# Patient Record
Sex: Male | Born: 1950
Health system: Southern US, Community
[De-identification: ages and names within clinical notes are randomized; demographics above are authoritative.]

## PROBLEM LIST (undated history)

## (undated) ENCOUNTER — Emergency Department (HOSPITAL_COMMUNITY): Discharge status: Absent without leave | Payer: No Typology Code available for payment source

## (undated) DIAGNOSIS — F1020 Alcohol dependence, uncomplicated: Secondary | ICD-10-CM

## (undated) DIAGNOSIS — K429 Umbilical hernia without obstruction or gangrene: Secondary | ICD-10-CM

## (undated) DIAGNOSIS — I4510 Unspecified right bundle-branch block: Secondary | ICD-10-CM

## (undated) DIAGNOSIS — T7840XA Allergy, unspecified, initial encounter: Secondary | ICD-10-CM

## (undated) DIAGNOSIS — F329 Major depressive disorder, single episode, unspecified: Secondary | ICD-10-CM

## (undated) DIAGNOSIS — R519 Headache, unspecified: Secondary | ICD-10-CM

## (undated) DIAGNOSIS — E785 Hyperlipidemia, unspecified: Secondary | ICD-10-CM

## (undated) DIAGNOSIS — F32A Depression, unspecified: Secondary | ICD-10-CM

## (undated) DIAGNOSIS — I10 Essential (primary) hypertension: Secondary | ICD-10-CM

## (undated) DIAGNOSIS — J45909 Unspecified asthma, uncomplicated: Secondary | ICD-10-CM

## (undated) DIAGNOSIS — R251 Tremor, unspecified: Secondary | ICD-10-CM

## (undated) DIAGNOSIS — J441 Chronic obstructive pulmonary disease with (acute) exacerbation: Secondary | ICD-10-CM

## (undated) DIAGNOSIS — I5032 Chronic diastolic (congestive) heart failure: Secondary | ICD-10-CM

## (undated) DIAGNOSIS — F419 Anxiety disorder, unspecified: Secondary | ICD-10-CM

## (undated) DIAGNOSIS — R911 Solitary pulmonary nodule: Secondary | ICD-10-CM

## (undated) DIAGNOSIS — R569 Unspecified convulsions: Secondary | ICD-10-CM

## (undated) DIAGNOSIS — R51 Headache: Secondary | ICD-10-CM

## (undated) DIAGNOSIS — I509 Heart failure, unspecified: Secondary | ICD-10-CM

## (undated) DIAGNOSIS — J449 Chronic obstructive pulmonary disease, unspecified: Secondary | ICD-10-CM

## (undated) HISTORY — DX: Depression, unspecified: F32.A

## (undated) HISTORY — DX: Essential (primary) hypertension: I10

## (undated) HISTORY — DX: Anxiety disorder, unspecified: F41.9

## (undated) HISTORY — DX: Allergy, unspecified, initial encounter: T78.40XA

## (undated) HISTORY — DX: Alcohol dependence, uncomplicated: F10.20

## (undated) HISTORY — DX: Major depressive disorder, single episode, unspecified: F32.9

## (undated) HISTORY — DX: Unspecified convulsions: R56.9

## (undated) HISTORY — DX: Unspecified asthma, uncomplicated: J45.909

## (undated) HISTORY — DX: Hyperlipidemia, unspecified: E78.5

## (undated) HISTORY — DX: Heart failure, unspecified: I50.9

---
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
## 2010-10-06 DIAGNOSIS — I509 Heart failure, unspecified: Secondary | ICD-10-CM

## 2010-10-06 HISTORY — DX: Heart failure, unspecified: I50.9

## 2010-10-21 ENCOUNTER — Emergency Department (HOSPITAL_COMMUNITY): Payer: PRIVATE HEALTH INSURANCE

## 2010-10-21 ENCOUNTER — Inpatient Hospital Stay (HOSPITAL_COMMUNITY)
Admission: EM | Admit: 2010-10-21 | Discharge: 2010-10-25 | DRG: 897 | Disposition: A | Discharge status: Home / Self Care | Payer: PRIVATE HEALTH INSURANCE | Attending: Family Medicine | Admitting: Family Medicine

## 2010-10-21 ENCOUNTER — Encounter: Payer: Self-pay | Admitting: Family Medicine

## 2010-10-21 DIAGNOSIS — F1010 Alcohol abuse, uncomplicated: Secondary | ICD-10-CM

## 2010-10-21 DIAGNOSIS — F17200 Nicotine dependence, unspecified, uncomplicated: Secondary | ICD-10-CM | POA: Diagnosis present

## 2010-10-21 DIAGNOSIS — I5023 Acute on chronic systolic (congestive) heart failure: Secondary | ICD-10-CM

## 2010-10-21 DIAGNOSIS — R569 Unspecified convulsions: Secondary | ICD-10-CM | POA: Diagnosis present

## 2010-10-21 DIAGNOSIS — T424X1A Poisoning by benzodiazepines, accidental (unintentional), initial encounter: Secondary | ICD-10-CM | POA: Diagnosis present

## 2010-10-21 DIAGNOSIS — E871 Hypo-osmolality and hyponatremia: Secondary | ICD-10-CM | POA: Diagnosis present

## 2010-10-21 DIAGNOSIS — F329 Major depressive disorder, single episode, unspecified: Secondary | ICD-10-CM | POA: Diagnosis present

## 2010-10-21 DIAGNOSIS — R188 Other ascites: Secondary | ICD-10-CM | POA: Diagnosis present

## 2010-10-21 DIAGNOSIS — F10939 Alcohol use, unspecified with withdrawal, unspecified: Principal | ICD-10-CM | POA: Diagnosis present

## 2010-10-21 DIAGNOSIS — I10 Essential (primary) hypertension: Secondary | ICD-10-CM

## 2010-10-21 DIAGNOSIS — E876 Hypokalemia: Secondary | ICD-10-CM | POA: Diagnosis present

## 2010-10-21 DIAGNOSIS — I509 Heart failure, unspecified: Secondary | ICD-10-CM | POA: Diagnosis present

## 2010-10-21 DIAGNOSIS — F3289 Other specified depressive episodes: Secondary | ICD-10-CM | POA: Diagnosis present

## 2010-10-21 DIAGNOSIS — F1020 Alcohol dependence, uncomplicated: Secondary | ICD-10-CM | POA: Diagnosis present

## 2010-10-21 DIAGNOSIS — F10239 Alcohol dependence with withdrawal, unspecified: Principal | ICD-10-CM | POA: Diagnosis present

## 2010-10-21 DIAGNOSIS — I5040 Unspecified combined systolic (congestive) and diastolic (congestive) heart failure: Secondary | ICD-10-CM | POA: Diagnosis present

## 2010-10-21 DIAGNOSIS — F411 Generalized anxiety disorder: Secondary | ICD-10-CM | POA: Diagnosis present

## 2010-10-21 DIAGNOSIS — T424X4A Poisoning by benzodiazepines, undetermined, initial encounter: Secondary | ICD-10-CM | POA: Diagnosis present

## 2010-10-21 DIAGNOSIS — J3489 Other specified disorders of nose and nasal sinuses: Secondary | ICD-10-CM | POA: Diagnosis present

## 2010-10-21 DIAGNOSIS — R000 Tachycardia, unspecified: Secondary | ICD-10-CM | POA: Diagnosis present

## 2010-10-21 DIAGNOSIS — I426 Alcoholic cardiomyopathy: Secondary | ICD-10-CM | POA: Diagnosis present

## 2010-10-21 LAB — PROTIME-INR
INR: 1.3 (ref 0.00–1.49)
Prothrombin Time: 16.4 seconds — ABNORMAL HIGH (ref 11.6–15.2)

## 2010-10-21 LAB — DIFFERENTIAL
Basophils Absolute: 0 10*3/uL (ref 0.0–0.1)
Basophils Relative: 0 % (ref 0–1)
Eosinophils Absolute: 0.1 10*3/uL (ref 0.0–0.7)
Monocytes Absolute: 1 10*3/uL (ref 0.1–1.0)
Monocytes Relative: 10 % (ref 3–12)
Neutro Abs: 8.4 10*3/uL — ABNORMAL HIGH (ref 1.7–7.7)
Neutrophils Relative %: 86 % — ABNORMAL HIGH (ref 43–77)

## 2010-10-21 LAB — LIPASE, BLOOD: Lipase: 27 U/L (ref 11–59)

## 2010-10-21 LAB — URINALYSIS, ROUTINE W REFLEX MICROSCOPIC
Glucose, UA: NEGATIVE mg/dL
Ketones, ur: 15 mg/dL — AB
Leukocytes, UA: NEGATIVE
Nitrite: NEGATIVE
Protein, ur: 100 mg/dL — AB
Urobilinogen, UA: 4 mg/dL — ABNORMAL HIGH (ref 0.0–1.0)

## 2010-10-21 LAB — RAPID URINE DRUG SCREEN, HOSP PERFORMED
Amphetamines: NOT DETECTED
Barbiturates: NOT DETECTED
Opiates: NOT DETECTED
Tetrahydrocannabinol: NOT DETECTED

## 2010-10-21 LAB — AMMONIA: Ammonia: 38 umol/L (ref 11–60)

## 2010-10-21 LAB — ETHANOL: Alcohol, Ethyl (B): <11 mg/dL (ref 0–11)

## 2010-10-21 NOTE — H&P (Signed)
Juan [name] is an 60 y.o. male.    Chief Complaint: New Onset seizure, AMS  HPI: Pt is a 60 yo male with a PMH of depression, anxiety and alcohol abuse who presented to the ED after a witnessed seizure at home. Pt has no memory of what happened today. Wife states pt has not been feeling well for the last week or so. He missed his appointment with Mental Health last Wednesday because he "felt something bad would happen". Wife called Psych NP, Valinda Hoar, on day of appointment who advised her to stop his Klonopin because NP was concerned about his alcohol use.  Pt's last dose of Klonopin was 5 days ago. He was taking 0.5mg  BID for an extended period of time prior to this abrupt stop. Today at 4:30 pm, wife states pt began stuttering and was not responding to questions. His arms became stiff, face was red and fell to ground. She called 9-1-1. Episode lasted a total of 15 minutes. After arrival to ED, pt had another witnessed seizure in which his arms were drawn up and gaze was to the right. He was given Ativan and O2. Pt had Head CT which was wnl. His labs showed he was hyponatremic, his LFTs were elevated, ammonia was wnl and clotting enzymes were wnl. Pt was started on banana bag and kept on O2.   PMH: Anxiety and depression treated by [facility] Does not have a PCP (has only been to the doctor once in many years) PSH: Ingrown toenail Fam Hx: mom had alzheimer's. DM and HTN also run in the family.  Social History: Lives with wife. Smokes 3/4-1.5ppd for 40 years. Drinks 1/2 gallon of Bourbon per week. No ilicit drug use. Works as a Interior and spatial designer at [employer]  Allergies: NKDA  Medications: Zoloft 100mg  2 tabs daily, Klonopin 0.5mg  po BID scheduled  Results for orders placed during the hospital encounter of 10/21/10 (from the past 48 hour(s))  DIFFERENTIAL     Status: Abnormal   Collection Time   10/21/10  5:59 PM      Component Value Range Comment   Neutrophils Relative 86 (*) 43 - 77 (%)    Neutro Abs 8.4 (*) 1.7 - 7.7 (K/uL)    Lymphocytes Relative 4 (*) 12 - 46 (%)    Lymphs Abs 0.3 (*) 0.7 - 4.0 (K/uL)    Monocytes Relative 10  3 - 12 (%)    Monocytes Absolute 1.0  0.1 - 1.0 (K/uL)    Eosinophils Relative 1  0 - 5 (%)    Eosinophils Absolute 0.1  0.0 - 0.7 (K/uL)    Basophils Relative 0  0 - 1 (%)    Basophils Absolute 0.0  0.0 - 0.1 (K/uL)   CBC     Status: Normal   Collection Time   10/21/10  5:59 PM      Component Value Range Comment   WBC 9.8  4.0 - 10.5 (K/uL)    RBC 4.85  3.87 - 5.11 (MIL/uL)    Hemoglobin 14.4  12.0 - 15.0 (g/dL)    HCT 03.4  74.2 - 59.5 (%)    MCV 83.9  78.0 - 100.0 (fL)    MCH 29.7  26.0 - 34.0 (pg)    MCHC 35.4  30.0 - 36.0 (g/dL)    RDW 63.8  75.6 - 43.3 (%)    Platelets 173  150 - 400 (K/uL)   PROTIME-INR     Status: Abnormal   Collection Time  10/21/10  5:59 PM      Component Value Range Comment   Prothrombin Time 16.4 (*) 11.6 - 15.2 (seconds)    INR 1.30  0.00 - 1.49    APTT     Status: Normal   Collection Time   10/21/10  5:59 PM      Component Value Range Comment   aPTT 30  24 - 37 (seconds)   COMPREHENSIVE METABOLIC PANEL     Status: Abnormal   Collection Time   10/21/10  5:59 PM      Component Value Range Comment   Sodium 124 (*) 135 - 145 (mEq/L)    Potassium 3.7  3.5 - 5.1 (mEq/L)    Chloride 85 (*) 96 - 112 (mEq/L)    CO2 26  19 - 32 (mEq/L)    Glucose, Bld 149 (*) 70 - 99 (mg/dL)    BUN 10  6 - 23 (mg/dL)    Creatinine, Ser 1.61  0.50 - 1.10 (mg/dL)    Calcium 8.7  8.4 - 10.5 (mg/dL)    Total Protein 6.5  6.0 - 8.3 (g/dL)    Albumin 3.4 (*) 3.5 - 5.2 (g/dL)    AST 096 (*) 0 - 37 (U/L)    ALT 69 (*) 0 - 35 (U/L)    Alkaline Phosphatase 195 (*) 39 - 117 (U/L)    Total Bilirubin 1.8 (*) 0.3 - 1.2 (mg/dL)    GFR calc non Af Amer >60  >60 (mL/min)    GFR calc Af Amer >60  >60 (mL/min)   ETHANOL     Status: Normal   Collection Time   10/21/10  5:59 PM      Component Value Range Comment   Alcohol, Ethyl (B) <11  0  - 11 (mg/dL)   LIPASE, BLOOD     Status: Normal   Collection Time   10/21/10  5:59 PM      Component Value Range Comment   Lipase 27  11 - 59 (U/L)   AMMONIA     Status: Normal   Collection Time   10/21/10  5:59 PM      Component Value Range Comment   Ammonia 38  11 - 60 (umol/L)   URINALYSIS, ROUTINE W REFLEX MICROSCOPIC     Status: Abnormal   Collection Time   10/21/10  9:49 PM      Component Value Range Comment   Color, Urine [place] T[place] (*) YELLOW  BIOCHEMICALS MAY BE AFFECTED BY COLOR   Appearance CLEAR  CLEAR     Specific Gravity, Urine 1.013  1.005 - 1.030     pH 6.5  5.0 - 8.0     Glucose, UA NEGATIVE  NEGATIVE (mg/dL)    Hgb urine dipstick TRACE (*) NEGATIVE     Bilirubin Urine SMALL (*) NEGATIVE     Ketones, ur 15 (*) NEGATIVE (mg/dL)    Protein, ur 045 (*) NEGATIVE (mg/dL)    Urobilinogen, UA 4.0 (*) 0.0 - 1.0 (mg/dL)    Nitrite NEGATIVE  NEGATIVE     Leukocytes, UA NEGATIVE  NEGATIVE    URINE MICROSCOPIC-ADD ON     Status: Abnormal   Collection Time   10/21/10  9:49 PM      Component Value Range Comment   Squamous Epithelial / LPF RARE  RARE     RBC / HPF 0-2  <3 (RBC/hpf)    Casts HYALINE CASTS (*) NEGATIVE    URINE RAPID DRUG SCREEN (HOSP PERFORMED)  Status: Normal   Collection Time   10/21/10  9:49 PM      Component Value Range Comment   Opiates NONE DETECTED  NONE DETECTED     Cocaine NONE DETECTED  NONE DETECTED     Benzodiazepines NONE DETECTED  NONE DETECTED     Amphetamines NONE DETECTED  NONE DETECTED     Tetrahydrocannabinol NONE DETECTED  NONE DETECTED     Barbiturates NONE DETECTED  NONE DETECTED     Dg Chest 2 View  10/21/2010  *RADIOLOGY REPORT*  Clinical Data: Seizure.  CHEST - 2 VIEW  Comparison: None  Findings: Cardiomegaly with pulmonary vascular congestion identified. Right lower lung atelectasis/consolidation and small to moderate right pleural effusion noted.  There is no evidence of pneumothorax. No acute bony abnormalities are identified.   IMPRESSION: Cardiomegaly, pulmonary vascular congestion with right lower lung atelectasis/consolidation and small to moderate right pleural effusion.  Original Report Authenticated By: Rosendo Gros, M.D.   Ct Head Wo Contrast  10/21/2010  *RADIOLOGY REPORT*  Clinical Data: C year.  CT HEAD WITHOUT CONTRAST  Technique:  Contiguous axial images were obtained from the base of the skull through the vertex without contrast.  Comparison: None.  Findings: Motion artifact is present on the examination.  Slices through the posterior fossa were repeated with persistent artifact. The no gross mass lesion, mass effect, midline shift, hydrocephalus, or hemorrhage. No acute infarct.  Despite motion artifact, the study is diagnostic and no acute intracranial abnormality is present.  Probable right lens extraction.  Left maxillary mucosal sinus disease.  Ethmoid air cell opacification is present.  IMPRESSION: No acute intracranial abnormality.  Paranasal sinus disease.  Original Report Authenticated By: Andreas Newport, M.D.    Review of Systems  Constitutional: Positive for malaise/fatigue. Negative for fever, chills and weight loss.  Respiratory: Positive for cough and wheezing.   Cardiovascular: Positive for leg swelling. Negative for chest pain.  Gastrointestinal: Negative for nausea, vomiting, abdominal pain and diarrhea.  Musculoskeletal: Negative for myalgias.  Skin: Positive for rash.  Neurological: Positive for seizures and weakness. Negative for headaches.  Endo/Heme/Allergies: Does not bruise/bleed easily.    Vital Signs: Temp 98.7    HR 113    RR 20    BP 136/91    95% on 2L [place]  Physical Exam  Vitals reviewed. Constitutional: She appears well-developed. She is sleeping.       Answers questions appropriately but falls asleep quickly. Awakes to voice/touch. Oriented to person, place, time, date.   HENT:  Head: Normocephalic and atraumatic.  Mouth/Throat: Dental caries present. No oropharyngeal  exudate or posterior oropharyngeal edema.       Bruising on lateral sides of tongue  Eyes: EOM are normal. Pupils are equal, round, and reactive to light. Scleral icterus is present.  Neck: Normal range of motion. Neck supple. Normal carotid pulses and no hepatojugular reflux present.  Cardiovascular: Normal rate and regular rhythm.  Exam reveals distant heart sounds.   Respiratory: Accessory muscle usage present. No stridor. No respiratory distress. She has wheezes.       Some expiratory snoring when asleep.  GI: She exhibits distension. There is no tenderness. A hernia (umbilical) is present.       Large distended abdomen with edema. Unable to fully assess for hepatosplenomegaly. Pt has erythema to midabdomen with some healing scabs. No spider angiomas noted. Large reducible umbilical hernia, unchanged per pt.   Musculoskeletal:       Bilateral venous stasis of lower  extremities. Erythema of right shin with some weeping. Multiple healing scabs. + Dry, flaking skin  Neurological: She has normal strength. No cranial nerve deficit or sensory deficit. She exhibits normal muscle tone.       2-3 beats of clonus L>R. Hyperreflexive.   Psychiatric: Thought content normal. Her mood appears not anxious. Her affect is not inappropriate. She is slowed.     Assessment/Plan 60 yo M with pmh of depression, anxiety and alcohol abuse presenting with new onset seizure and altered mental status. 1. Seizures: Admit to FPTS; Obs status in stepdown unit. Could be from Benzo withdrawal given his chronic Benzo use with abrupt cessation 5 days ago. Pt had a Head CT which was wnl but given his new onset seizure activity we will consider getting a EEG in the morning. For tonight, we will keep him on Diazepam for withdrawal. This will need to be weaned over many weeks. Continue to monitor for seizure activity; if pt has a seizure lasting longer than 5 min, will give Diazepam rectal gel. If pt becomes overly sedated or has  drop in HR/BP, will give Flumazenil. Nurse to call if he has any seizure activity. 2. Alcohol abuse: Pt is a heavy drinker. He did not have alcohol today. CIWA protocol not started since he will be getting Diazepam. 3. HTN: BP elevated in ED today. Will start Metoprolol 2.5mg  IV for SBP>160 or DBP>100. 4. Depression: Will restart Zoloft tomorrow. (He got today's dose) 5. Tobacco Dependence: Will give nicotine patches, if needed 6. FEN/GI: NPO due to seizure activity. Continue banana bag  7. PPX: Protonix 20mg , Lovenox 40mg  SQ 8. Dispo: Pending clinical improvement.   PCP: None Contact: Wife, Lynden Ang [phone] Code status: Full  Ahmyah Gidley 10/21/2010, 10:45 PM

## 2010-10-22 LAB — ACETAMINOPHEN LEVEL: Acetaminophen (Tylenol), Serum: <15 ug/mL (ref 10–30)

## 2010-10-23 ENCOUNTER — Other Ambulatory Visit (HOSPITAL_COMMUNITY): Payer: PRIVATE HEALTH INSURANCE

## 2010-10-24 ENCOUNTER — Inpatient Hospital Stay (HOSPITAL_COMMUNITY): Payer: PRIVATE HEALTH INSURANCE

## 2010-10-24 LAB — CBC
Hemoglobin: 14.4 g/dL (ref 13.0–17.0)
Hemoglobin: 13.5 g/dL (ref 13.0–17.0)
MCH: 29.7 pg (ref 26.0–34.0)
MCH: 28.8 pg (ref 26.0–34.0)
MCH: 29.4 pg (ref 26.0–34.0)
MCH: 29.2 pg (ref 26.0–34.0)
MCHC: 35.4 g/dL (ref 30.0–36.0)
MCHC: 34.1 g/dL (ref 30.0–36.0)
MCHC: 33.9 g/dL (ref 30.0–36.0)
MCV: 84.5 fL (ref 78.0–100.0)
MCV: 85 fL (ref 78.0–100.0)
MCV: 86 fL (ref 78.0–100.0)
Platelets: 173 10*3/uL (ref 150–400)
Platelets: 175 10*3/uL (ref 150–400)
Platelets: 174 10*3/uL (ref 150–400)
RBC: 4.85 MIL/uL (ref 4.22–5.81)
RBC: 4.65 MIL/uL (ref 4.22–5.81)
RBC: 4.59 MIL/uL (ref 4.22–5.81)
RDW: 13.5 % (ref 11.5–15.5)
WBC: 7.7 10*3/uL (ref 4.0–10.5)

## 2010-10-24 LAB — BASIC METABOLIC PANEL
CO2: 33 mEq/L — ABNORMAL HIGH (ref 19–32)
Glucose, Bld: 153 mg/dL — ABNORMAL HIGH (ref 70–99)
Potassium: 3.4 mEq/L — ABNORMAL LOW (ref 3.5–5.1)
Sodium: 131 mEq/L — ABNORMAL LOW (ref 135–145)

## 2010-10-24 LAB — COMPREHENSIVE METABOLIC PANEL
ALT: 54 U/L — ABNORMAL HIGH (ref 0–53)
AST: 86 U/L — ABNORMAL HIGH (ref 0–37)
AST: 59 U/L — ABNORMAL HIGH (ref 0–37)
Albumin: 3.4 g/dL — ABNORMAL LOW (ref 3.5–5.2)
Albumin: 3.1 g/dL — ABNORMAL LOW (ref 3.5–5.2)
Alkaline Phosphatase: 167 U/L — ABNORMAL HIGH (ref 39–117)
BUN: 10 mg/dL (ref 6–23)
BUN: 10 mg/dL (ref 6–23)
CO2: 25 mEq/L (ref 19–32)
CO2: 28 mEq/L (ref 19–32)
Calcium: 8.7 mg/dL (ref 8.4–10.5)
Calcium: 8.6 mg/dL (ref 8.4–10.5)
Calcium: 8.3 mg/dL — ABNORMAL LOW (ref 8.4–10.5)
Chloride: 89 mEq/L — ABNORMAL LOW (ref 96–112)
Creatinine, Ser: 0.54 mg/dL (ref 0.50–1.35)
Creatinine, Ser: 0.56 mg/dL (ref 0.50–1.35)
GFR calc Af Amer: >60 mL/min (ref >60)
GFR calc Af Amer: >60 mL/min (ref >60)
GFR calc Af Amer: >60 mL/min (ref >60)
GFR calc non Af Amer: >60 mL/min (ref >60)
GFR calc non Af Amer: >60 mL/min (ref >60)
Glucose, Bld: 149 mg/dL — ABNORMAL HIGH (ref 70–99)
Glucose, Bld: 117 mg/dL — ABNORMAL HIGH (ref 70–99)
Glucose, Bld: 134 mg/dL — ABNORMAL HIGH (ref 70–99)
Potassium: 2.8 mEq/L — ABNORMAL LOW (ref 3.5–5.1)
Sodium: 130 mEq/L — ABNORMAL LOW (ref 135–145)
Total Protein: 6.5 g/dL (ref 6.0–8.3)
Total Protein: 6.1 g/dL (ref 6.0–8.3)
Total Protein: 5.9 g/dL — ABNORMAL LOW (ref 6.0–8.3)

## 2010-10-24 LAB — CARDIAC PANEL(CRET KIN+CKTOT+MB+TROPI)
CK, MB: 4 ng/mL (ref 0.3–4.0)
Troponin I: <0.3 ng/mL (ref <0.30)

## 2010-10-24 LAB — MAGNESIUM: Magnesium: 1.5 mg/dL (ref 1.5–2.5)

## 2010-10-25 ENCOUNTER — Inpatient Hospital Stay (HOSPITAL_COMMUNITY): Payer: PRIVATE HEALTH INSURANCE

## 2010-10-25 LAB — COMPREHENSIVE METABOLIC PANEL
ALT: 50 U/L (ref 0–53)
AST: 50 U/L — ABNORMAL HIGH (ref 0–37)
CO2: 28 mEq/L (ref 19–32)
Calcium: 8.8 mg/dL (ref 8.4–10.5)
Chloride: 91 mEq/L — ABNORMAL LOW (ref 96–112)
Creatinine, Ser: 0.6 mg/dL (ref 0.50–1.35)
GFR calc Af Amer: >60 mL/min (ref >60)
GFR calc non Af Amer: >60 mL/min (ref >60)
Glucose, Bld: 141 mg/dL — ABNORMAL HIGH (ref 70–99)
Sodium: 130 mEq/L — ABNORMAL LOW (ref 135–145)
Total Bilirubin: 1.6 mg/dL — ABNORMAL HIGH (ref 0.3–1.2)

## 2010-10-25 LAB — CBC
Hemoglobin: 14 g/dL (ref 13.0–17.0)
MCH: 29.9 pg (ref 26.0–34.0)
MCV: 85.7 fL (ref 78.0–100.0)
Platelets: 181 10*3/uL (ref 150–400)
RBC: 4.69 MIL/uL (ref 4.22–5.81)
WBC: 8.7 10*3/uL (ref 4.0–10.5)

## 2010-10-25 LAB — HEPATITIS PANEL, ACUTE: Hep A IgM: NEGATIVE

## 2010-10-26 ENCOUNTER — Telehealth: Payer: Self-pay | Admitting: Family Medicine

## 2010-10-26 NOTE — Telephone Encounter (Signed)
Called Mr. Rusher and left a message to let him know that his chest x-ray from yesterday showed some moderate increase in pleural effusion and that he needs to increase his morning lasix dose to 80mg  instead of 40mg  and keep his afternoon 40mg  dose. I gave my pager number and asked them to call me back when they received the message.

## 2010-10-26 NOTE — Telephone Encounter (Signed)
Mr. Queenan was seen by Dr. Leveda Anna and Dr. Mikel Cella in the hospital recently.  He was told to make a 1 week Hospital Follow up, but he does not have a primary care physician.  Can he be set up with [facility] for this.Cammie Mcgee

## 2010-10-26 NOTE — Telephone Encounter (Signed)
Per Dr. Leveda Anna will schedule with Dr. Mikel Cella for hfu and new patient appt.

## 2010-10-29 NOTE — H&P (Signed)
NAMEDONTRELLE, MAZON NO.:  [id number]  MEDICAL RECORD NO.:  [id number]  LOCATION:  [facility]                         FACILITY:  [facility]  PHYSICIAN:  Santiago Bumpers. Hensel, M.D.DATE OF BIRTH:  1950/07/16  DATE OF ADMISSION:  10/21/2010 DATE OF DISCHARGE:                             HISTORY & PHYSICAL   CHIEF COMPLAINT:  New onset seizure and altered mental status.  HISTORY OF PRESENT ILLNESS:  The patient is a 60 year old male with past medical history of depression, anxiety, and alcohol abuse who presented to the emergency department after a witnessed seizure at home.  The patient has no memory of what happened today.  Wife states the patient has not been feeling well for the last week or so.  He missed his appointment with Mental Health last Wednesday.  Wife then called this psychiatric nurse practitioner, on day of that missed appointment who advised the wife to stop the patient's Klonopin because the nurse practitioner was concerned about his alcohol use in combination with the benzodiazepine.  The patient's last dose of Klonopin was 5 days ago. He was taking 0.5 mg b.i.d. for quite an extended period of time prior to this abrupt stop.  Today at 4:30 p.m., wife states the patient began stuttering and was not responding to her questions.  His arms became stiff, his face turned red and he fell to the round.  Wife immediately called 911.  This episode lasted total of about 15 minutes.  After arrival to the emergency department, the patient had another witnessed seizure in which his arms were drawn up and gaze was to the right.  He was given Ativan and oxygen.  The patient had head CT, which was within normal limits.  His labs in the emergency department showed he was hyponatremic.  His LFTs were elevated, but his ammonia and cardiac enzymes were within normal limits.  The patient was started on a banana bag and kept on oxygen.  PAST MEDICAL HISTORY:  Anxiety and  depression, treated by the Behavioral Health.  The patient does not have a PCP (he has only been to the doctor once in many years).  PAST SURGICAL HISTORY:  Ingrown toenail.  FAMILY HISTORY:  Mother had Alzheimer disease.  Diabetes and hypertension also run in the family.  SOCIAL HISTORY:  The patient lives with his wife.  He smoked 3 quarters to 1-1/2 packs per day for the past 40 years.  The patient drinks approximately one-half gallon of bourbon per week.  He denies any illicit drug use.  He works as a Interior and spatial designer at [employer].  ALLERGIES:  No known drug allergies.  MEDICATIONS: 1. Zoloft 100 mg 2 tablets daily. 2. Klonopin 0.5 mg p.o. b.i.d. scheduled, last dose October 16, 2010.  LABORATORY DATA:  WBC 9.8, hemoglobin 14.4, hematocrit 40.7, platelets 173.  Sodium 124, potassium 3.7, chloride 85, bicarb 26, BUN 10, creatinine 0.59, total protein 6.5, albumin 3.4, AST 116, ALT 69, alk phos 195, bilirubin 1.8, PT 16.4, INR 1.3, ammonia 38, lipase 27.  Urine drug screen negative.  Alcohol negative.  Tylenol negative. Salicylates negative.  X-RAYS:  Chest show cardiomegaly and pulmonary vascular congestion with right  lower lobe atelectasis and small right pleural effusion.  Head CT showed no acute intracranial abnormalities.  REVIEW OF SYSTEMS:  Positive for fatigue, cough, and leg swelling, otherwise negative.  PHYSICAL EXAMINATION:  VITAL SIGNS:  Temperature 98.7, heart rate 113, respirations 20, blood pressure 136/91, O2 sat 95% on 2 L nasal cannula. GENERAL:  Appears well-developed.  He answers questions appropriately, but falls asleep quickly.  He awakes easily to voice or touch.  The patient is oriented to person, place, time, and date. HEAD:  Normocephalic and atraumatic. MOUTH:  Dental caries are present.  No oropharyngeal exudate or posterior edema.  The patient does have bruising on lateral sides of tongue. EYES:  Pupils are equal, round, and reactive to  light.  Mild scleral icterus is present. NECK:  Normal range of motion.  Neck supple.  No lymphadenopathy. Normal carotid pulses and no hepatojugular reflex present. CARDIOVASCULAR:  Normal rate and rhythm.  Exam reveals distant heart sounds. RESPIRATORY:  Some accessory muscle usage is present.  No stridor.  No respiratory distress.  He has diffuse wheezing and some expiratory snoring when he is asleep. GI:  The patient does have a large distended abdomen with some edema. The patient is nontender, but unable to fully assess for hepatosplenomegaly.  The patient has erythema of the skin to mid abdomen with some healing scab.  No spider angioma is noted.  The patient has large reducible umbilical hernia, which is unchanged per the patient's report. MUSCULOSKELETAL:  Bilateral venous stasis of lower extremities, erythema of right shin with some weeping.  The patient has multiple healing scabs on lower extremities bilaterally. NEUROLOGIC:  The patient has normal strength and no cranial nerve deficit or sensory deficit.  He exhibits normal muscle tone.  He has 2-3 beats of clonus with the left being more obvious than right.  He is hyperreflexive. PSYCHIATRIC:  Thought content normal.  He is not anxious or inappropriate, although, his speech is slowed.  ASSESSMENT AND PLAN:  A 60 year old male with past medical history of depression, anxiety, and alcohol abuse presenting with new onset seizure and altered mental status. 1. Seizure.  Admit to [facility] observation status in step-     down unit.  Seizure could be from benzodiazepine withdrawal given     his chronic benzo use with abrupt cessation 5 days ago.  The     patient had a head CT, which was within normal limits.  Given his     new onset seizure activity, we will consider getting an EEG in the     morning.  For tonight, we will keep him on diazepam for withdrawal.     This will need to be weaned over many  weeks.  Continue to monitor     for seizure activity.  If the patient has a seizure lasting longer     than 5 minutes, we will give diazepam rectal gel.  Nurse to call if he     has any seizure activity while in the unit. 2. Alcohol abuse.  The patient is a heavy drinker.  He did not have     any alcohol today.  CIWA protocol not started since he will be     getting diazepam for seizures. 3. Hypertension.  Blood pressure elevated in the emergency department     today.  We will start metoprolol 25 mg for systolic blood     pressure greater than 160 or diastolic blood pressure greater than  100.  Continue to monitor. 4. Depression.  We will restart his home dose of Zoloft tomorrow.  The     patient did receive today's dose this morning. 5. Tobacco dependence.  We will give nicotine patches as needed. 6. Fluids, electrolytes, nutrition/gastrointestinal.  The patient is     n.p.o. due to seizure activity.  Continue banana bag. 7. Prophylaxis.  Protonix 20 mg and Lovenox 40 mg subcu. 8. Disposition.  Pending clinical improvement.  Contact his wife, [name] at phone number [phone].  CODE STATUS:  Full code.    ______________________________ Rodman Pickle, MD   ______________________________ Santiago Bumpers. Leveda Anna, M.D.    AH/MEDQ  D:  10/22/2010  T:  10/22/2010  Job:  [id number]  Electronically Signed by Rodman Pickle  on 10/23/2010 03:10:30 AM Electronically Signed by Doralee Albino M.D. on 10/29/2010 09:05:44 AM

## 2010-10-31 ENCOUNTER — Telehealth: Payer: Self-pay | Admitting: Family Medicine

## 2010-10-31 NOTE — Telephone Encounter (Signed)
FMLA forms placed in Dr. Tyson Alias box for completion.  Patient is scheduled to see Dr. Gwendolyn Grant on 11/05/2010.   [name]

## 2010-10-31 NOTE — Telephone Encounter (Signed)
Patients wife dropped off fmla forms to be completed, pt was just released from the hospital & was seen by [facility] team, scheduled with Dr. Gwendolyn Grant. Forms given to D. Loring for any clinical completion.

## 2010-11-05 ENCOUNTER — Encounter: Payer: Self-pay | Admitting: Family Medicine

## 2010-11-05 ENCOUNTER — Ambulatory Visit (INDEPENDENT_AMBULATORY_CARE_PROVIDER_SITE_OTHER): Payer: PRIVATE HEALTH INSURANCE | Admitting: Family Medicine

## 2010-11-05 DIAGNOSIS — R160 Hepatomegaly, not elsewhere classified: Secondary | ICD-10-CM

## 2010-11-05 DIAGNOSIS — I5040 Unspecified combined systolic (congestive) and diastolic (congestive) heart failure: Secondary | ICD-10-CM

## 2010-11-05 DIAGNOSIS — F329 Major depressive disorder, single episode, unspecified: Secondary | ICD-10-CM

## 2010-11-05 DIAGNOSIS — F3289 Other specified depressive episodes: Secondary | ICD-10-CM

## 2010-11-05 DIAGNOSIS — J309 Allergic rhinitis, unspecified: Secondary | ICD-10-CM

## 2010-11-05 DIAGNOSIS — F1020 Alcohol dependence, uncomplicated: Secondary | ICD-10-CM

## 2010-11-05 DIAGNOSIS — R5381 Other malaise: Secondary | ICD-10-CM

## 2010-11-05 DIAGNOSIS — R5383 Other fatigue: Secondary | ICD-10-CM | POA: Insufficient documentation

## 2010-11-05 DIAGNOSIS — F32A Depression, unspecified: Secondary | ICD-10-CM | POA: Insufficient documentation

## 2010-11-05 DIAGNOSIS — F411 Generalized anxiety disorder: Secondary | ICD-10-CM

## 2010-11-05 DIAGNOSIS — F419 Anxiety disorder, unspecified: Secondary | ICD-10-CM

## 2010-11-05 DIAGNOSIS — R7401 Elevation of levels of liver transaminase levels: Secondary | ICD-10-CM

## 2010-11-05 DIAGNOSIS — Z23 Encounter for immunization: Secondary | ICD-10-CM

## 2010-11-05 LAB — COMPREHENSIVE METABOLIC PANEL
Albumin: 4.8 g/dL (ref 3.5–5.2)
BUN: 28 mg/dL — ABNORMAL HIGH (ref 6–23)
Calcium: 10.4 mg/dL (ref 8.4–10.5)
Chloride: 98 mEq/L (ref 96–112)
Creat: 1.06 mg/dL (ref 0.50–1.35)
Glucose, Bld: 96 mg/dL (ref 70–99)
Potassium: 5.4 mEq/L — ABNORMAL HIGH (ref 3.5–5.3)

## 2010-11-05 LAB — CBC
HCT: 52.7 % — ABNORMAL HIGH (ref 39.0–52.0)
Hemoglobin: 17.8 g/dL — ABNORMAL HIGH (ref 13.0–17.0)
RBC: 6.04 MIL/uL — ABNORMAL HIGH (ref 4.22–5.81)
WBC: 8.1 10*3/uL (ref 4.0–10.5)

## 2010-11-05 NOTE — Telephone Encounter (Signed)
I am to see him this morning.  Will discuss further with him in person.

## 2010-11-05 NOTE — Assessment & Plan Note (Signed)
Found by ultrasound. Had mild transaminitis while in house. Check CMET today.

## 2010-11-05 NOTE — Assessment & Plan Note (Addendum)
Currently a submaximal systolic heart failure therapy and management. His blood pressure may limit his medications. His systolic blood pressure today was 102 and he is only on 3.125 mg of carvedilol and 5 mg of lisinopril. He seems to be close to if not at his dry weight today. We'll check electrolytes and creatinine today. Decreased his Lasix and potassium to just once a day and we can likely stop this in the near future. We'll start him on a heart failure action plan. Recommended he weigh himself daily today. Question we will when we should obtain another echo for him. May follow serial BMPs to determine this. Will most likely recheck echo in 6 months depending on his symptoms. His blood pressure will tolerate it, may benefit from Aldactone especially if he has some degree of likely alcohol induced hepatomegaly.

## 2010-11-05 NOTE — Patient Instructions (Signed)
Come back and see me in 2 weeks. Any more seizures be sure to give me a call We'll check some lab tests today: Kidney function, liver function, electrolytes. If any of this is abnormal we will call. Otherwise you will receive a letter by mail. I will fill out the FMLA forms today and send those back in. I will send in a referral for physical therapy today.  Keep working on getting stronger and increasing your stamina Make sure to get set up with Dr. Jennelle Human. Decrease the Lasix (fluid pill) to just one pill a day. Decrease the potassium to just one pill a day. When you come back to see me in 2 weeks we may totally stop the fluid pill. It was good to see you today!

## 2010-11-05 NOTE — Assessment & Plan Note (Signed)
Continue sertraline. Follow up with Dr. Carrie Mew psychiatry.

## 2010-11-05 NOTE — Assessment & Plan Note (Signed)
Continue Klonopin for now. Has appointment scheduled with Dr. Haywood Lasso of psychiatry is to followup with this physician.

## 2010-11-05 NOTE — Progress Notes (Signed)
Addended by: Deno Etienne on: 11/05/2010 02:15 PM   Modules accepted: Orders

## 2010-11-05 NOTE — Assessment & Plan Note (Signed)
Continue fluticasone  

## 2010-11-05 NOTE — Assessment & Plan Note (Signed)
He is at risk for recurrence especially as he is starting work again next week. He'll be going to work half days for the next 2 weeks. He states that his trigger for alcoholism stress from work. Recommend AA at next visit, also continue to encourage outpatient treatment for this.

## 2010-11-05 NOTE — Progress Notes (Signed)
  Subjective:    Patient ID: Juan [name], male    DOB: 1950-09-25, 60 y.o.   MRN: [id number]  HPI Patient returns for hospital followup he was in the hospital and admitted for new onset seizures. Seizures were most likely secondary to alcohol withdrawal and benzodiazepines cessation. While in-house was found he had mixed systolic and diastolic congestive heart failure today for 2 week hospital followup:  1.  seizures: Has had no further altered level of consciousness. Denies any seizures. Does report some fatigue and weakness since leaving the hospital. No focal weakness no decreased sensation. A false  #2. Heart failure: Patient has lost about 40 pounds since leaving the hospital. He feels much better. Denies any chest pain, palpitations, shortness of breath, edema. He's been taking Lasix twice a day as directed. He's had no decrease in his urinary output since being on Lasix. He had appropriate urinary output. Echo in the hospital showed EF of 40% as well as grade 2 diastolic dysfunction. He does endorse a mild cough but does not have this every day. Cough is productive of dark tarry substance.  3. Alcoholism: Patient has not had a drink of alcohol or cigarettes since leaving the hospital. He does not have any current desire is recommended to be seen at [facility] or obtain some sort of outpatient alcohol treatment actually in the hospital. However he feels he can do this on his own that he is somewhat worried about one work starts back. Stress was his trigger beforehand.  4. Fatigue: Patient has been sleeping much more than usual since in the hospital. He states that he feels much better from his increased sleep. He believes is secondary to not being at work and not under stress. His wife is a little worried because he has been sleeping so much. He states he sleeps until 8 or 9 in the morning which is much less than he used to sleep in the past. He does feel somewhat more fatigued during the  day. His wife comments that he has lost a lot of muscle mass.  5. Patient has known seasonal allergies and this is a rough time of year for him. He's been using this particular which helps. He does endorse some allergic rhinitis. He has also helped with this.   Review of Systems See HPI above for review of systems.       Objective:   Physical Exam Gen:  Alert, cooperative patient who appears stated age in no acute distress.  Vital signs reviewed. HEENT:  [place]/AT.  EOMI, PERRL.  MMM, tonsils non-erythematous, non-edematous.  External ears WNL, Bilateral TM's normal without retraction, redness or bulging.  Neck: No masses or thyromegaly or limitation in range of motion.  No cervical lymphadenopathy. Cardiac:  Regular rate and rhythm without murmur auscultated.  Good S1/S2. Pulm:  Clear to auscultation bilaterally with good air movement.  No wheezes or rales noted.   Abd:  Soft/nondistended/nontender.  Hepatomegaly noted.  2 cm umbilical hernia noted, easily reduced, no pain.  Good BS.  No anasarca. No abdominal wall edema. Ext:  Mild erythema and dry skin secondary to decreased edema noted bilateral lower extremities. Not able to appreciate any edema currently. Neuro:  Alert and oriented x3. No focal peripheral deficits. Within normal limits. Random alternating movements normal. Finger to nose is normal       Assessment & Plan:

## 2010-11-05 NOTE — Assessment & Plan Note (Signed)
Likely conditioning. Patient not seen by physical therapy while in the hospital. Plan to refer to physical therapy today. Patient also very motivated to increase his stamina are present. Encouraged him to follow through this may

## 2010-11-06 ENCOUNTER — Telehealth: Payer: Self-pay | Admitting: Family Medicine

## 2010-11-06 LAB — BRAIN NATRIURETIC PEPTIDE: Brain Natriuretic Peptide: 538.7 pg/mL — ABNORMAL HIGH (ref 0.0–100.0)

## 2010-11-06 NOTE — Telephone Encounter (Signed)
Called to discuss that patient's potassium is high and he should stop taking the potassium supplements.  Also called to discuss that he looks a little dehydrated based on his labs and that he should take the Lasix for 3 more days (just 1 pill) and then stop.

## 2010-11-06 NOTE — Telephone Encounter (Signed)
FMLA forms faxed to [phone] [name]

## 2010-11-07 ENCOUNTER — Telehealth: Payer: Self-pay | Admitting: Family Medicine

## 2010-11-07 NOTE — Telephone Encounter (Signed)
Patient called back this morning and was informed of the message below. He will discuss this more at his follow up visit coming up.Erma Pinto

## 2010-11-07 NOTE — Telephone Encounter (Signed)
Along with FMLA papers he needs returned to work part time for 2 weeks 8-12pm - needs for Monday

## 2010-11-09 NOTE — Telephone Encounter (Signed)
Pt called again, needs this today please

## 2010-11-09 NOTE — Telephone Encounter (Signed)
Call back. His telephone number does not appear to be correct or functional. Unable to leave a message.

## 2010-11-13 NOTE — Discharge Summary (Signed)
NAMEASCENCION, COYE NO.:  [id number]  MEDICAL RECORD NO.:  [id number]  LOCATION:  5509                         FACILITY:  [facility]  PHYSICIAN:  Santiago Bumpers. Macarthur Lorusso, M.D.DATE OF BIRTH:  [birth year]  DATE OF ADMISSION:  10/21/2010 DATE OF DISCHARGE:  10/25/2010                              DISCHARGE SUMMARY   PRIMARY CARE PROVIDER:  The patient did not have primary care provider on admission, but primary care on discharge is Dr. Gwendolyn Grant at [facility].  DISCHARGE DIAGNOSES: 1. Seizures, resolved. 2. Systolic and diastolic heart failure. 3. Elevated liver enzymes. 4. Alcohol abuse. 5. Hyponatremia. 6. Hypokalemia. 7. Hypertension.  CONSULTS:  Child psychotherapist for help with resources for outpatient alcohol rehab.  PROCEDURES: 1. Head CT without contrast, no acute intracranial abnormality.     Paranasal sinus disease.  October 21, 2010. 2. Chest x-ray on October 21, 2010, showing cardiomegaly, pulmonary     vascular congestion with right lower lobe lung atelectasis,     consolidation, and small to moderate right pleural effusion. 3. Ultrasound of abdomen, a small amount of ascites and pleural     effusions.  Lobular liver contour, could represent cirrhosis.     Large gallstone without evidence of cholecystitis.  Common bile     duct is upper limits of normal.  Chest x-ray on October 25, 2010,     showing cardiomegaly with mild interstitial edema.  Moderate right     pleural effusion increased.  Right lower lobe opacity, atelectasis     versus pneumonia. 4. EKG showing right bundle-branch block.  LABORATORY DATA:  PT/INR:  PT 16.4, INR 1.3, and PTT 30.  Alcohol level less than 11.  Lipase 27.  Ammonia 38.  Drug screen UDS negative. Urinalysis:  Small bilirubin, 15 ketones, trace blood, 100 protein, 4 urobilinogen, negative, nitrates negative leukocytes.  Urine micro:  0-2 rbc's, hyaline casts.  Pro-BNP 13,594.  Tylenol level less than  15. Aspirin level less than 2.  Repeat PT/INR 2 days after admission, PT 16.4 and INR 1.3.  CBC on admission:  WBC 9.8, hemoglobin 14.4, hematocrit 40.7, and platelets 173.  Complete metabolic panel on admission:  Sodium 124, potassium 3.7, chloride 85, CO2 of 26, glucose 149, BUN 10, creatinine 0.59, total bilirubin 1.8, alk phos 195, AST 116, ALT 69, total protein 6.5, albumin 3.4, calcium 8.7.  Cardiac enzymes on October 22, 2010, negative x1.  Potassium on October 24, 2010, 2.8, magnesium 1.5.  CBC on discharge:  WBC 8.7, hemoglobin 49, hematocrit 40.2, and platelet 181.  BMET on discharge:  Sodium 130, potassium 3.9, chloride 91, CO2 of 28, glucose 141, BUN 13, creatinine 0.6, bilirubin 1.6, alk phos 159, AST 50, ALT 50, total protein 6.2, albumin 3, and calcium 8.8.  Acute hepatitis panel negative for hep B surface antigen, hep B core antibody, hepatitis A IgM, and hepatitis C antibody.  BRIEF HOSPITAL COURSE:  This is a 60 year old male with history of depression, anxiety, alcohol abuse, and venous stasis who presented with new onset seizure and altered mental status. 1. Seizures, resolved.  The patient had witnessed seizure at home by     his wife and another seizure  witnessed in the ED after which the     patient did not have any more seizures.  The patient had been     taking Klonopin and had started drinking and was told to stop     taking Klonopin on Wednesday the previous week.  His seizure had     started on "Sunday.  It was therefore thought that his seizure was     due to withdrawal from benzodiazepines.  An EEG was not done     given the fact that the reason for his seizure is highly likely to     be benzodiazepines and/or alcohol withdrawal.  The patient had     reported falling and hitting his head with a seizure episode and     head CAT scan did not show any acute process.  The patient was     started on CIWA protocol for alcohol and benzodiazepine withdrawal.      He was also started on low-dose Ativan 0.5 mg b.i.d.  The patient     did have on day #4 of admission an episode of increased heart rate     in 140s-150s as well as elevated blood pressure.  This was thought     to be due to autonomic instability due to withdrawal.  At this     time, the patient had received metoprolol for blood pressure and     heart rate control as well as Ativan following the guidelines of     the CIWA protocol.  After that, the patient's CIWA score was     between 0 and 1 and he no longer required any p.r.n. doses of     Ativan.  The patient did not show any more seizure activity and did     not show any more signs of withdrawal.  The patient was discharged     on Klonopin 0.5 mg b.i.d.  The patient did have some anxiety at     baseline.  The patient was advised to not stop benzodiazepines     abruptly without consulting and seeing a physician first. 2. Systolic and diastolic heart failure.  The patient arrived in the     hospital and had an elevated BNP as well as cardiomegaly on chest x-     ray.  He also had increased edema in the lower extremities     associated with venous stasis.  He had anasarca with abdominal     distention.  It was unclear whether the edema was due to hepatic     failure and dysfunction that led to heart failure.  The patient     therefore had 2-D echo, which showed an EF of 40% and a grade 2     diastolic dysfunction.  It also showed moderately dilated left     atrium, calcified mitral valve annulus, and mild regurgitation.  It     also showed the inferior vena cava to be dilated consistent with     elevated central venous pressure.  The patient was started on Lasix     40"  mg p.o. daily, which was then increased to Lasix 40 mg twice a     day.  He was also started on lisinopril 5 mg daily.  It was thought     that the patient's heart failure had probably been going on longer     in the acute setting during this hospitalization and a beta-      blocker was  started with low-dose Coreg 3.125 mg b.i.d.  The     patient did have some improvement of lower extremity edema.  He     did, however, still have some chronic cough and a chest x-ray     repeated on the day of discharge did show more congestion.     Therefore, it was decided to discharge the patient on Lasix 80 mg in the     morning and 40 mg at night.  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  It     can be worthwhile in the future to rule out any CAD as the reason     for heart failure.  Stress test could potentially be done in the     outpatient setting once this acute event has resolved. 3. Elevated liver enzymes.  The patient was found to have an elevated     AST at 160 and ALT of 69.  This almost fell in the 2:1 ratio seen in alcoholic hepatitis. His synthetic function seemed to be preserved with an INR of 1.3. the albumin was only mildly low at     3.4, which was not entirely consistent with the extent of his     abdominal distention.  The patient's AST and ALT did trend down     throughout the hospitalization and were on discharge at 50 and 50. 4. Alcohol abuse.  The patient had a history of long-term alcohol     abuse.  He and his wife were interested for him to quit alcohol and     get help in the outpatient setting. Theresia Bough, clinical     social worker went with him and gave information about outpatient     rehab including the [facility].  The patient also got thiamine, folic acid and a banana bag when arriving the hospital. 5. Hyponatremia.  The patient on admission had a sodium of 124.  It     was thought that this could be due to low p.o. intake as well as     alcoholism.  He was also on Zoloft which was also a potential     explanation for his hyponatremia.  The next day the sodium     increased to 126 and slowly up to the 128-130s.  The Zoloft was     continued since his sodium was steadily  increasing.  The patient's     sodium on discharge was 130. 6. Hypokalemia.  The patient had a normal potassium on admission, but     potassium started trending down after initialization of Lasix.  His     potassium went down to 2.8, so he was replenished with potassium     chloride 40 mEq x4 and magnesium was also found to be on the low     end of normal and was therefore replenished as well.  The patient     was discharged on supplemental dose of potassium 40 mEq to take     along with the Lasix.  The patient's potassium on discharge was     3.9. 7. Hypertension.  The patient had episodes of elevated blood pressures     which were initially thought to be due to alcohol withdrawal.     These blood pressures went as high as 165/107.  He was started on     lisinopril 5 and Coreg 3.125 as medications for heart failure.  His     blood pressure decreased to 126/87 on discharge. 8. Nasal and sinus congestion.  The patient had some sinus congestion     during the hospitalization.  He was started on loratadine 10 mg     daily as well as Flonase.  He was also complaining of a cough which     was thought to be due to congestion from heart failure.  He was     encouraged to use incentive spirometer during the couple last days     of hospitalization. 9. Tobacco abuse.  The patient had history of tobacco abuse and was     started on patch 21 mg which he tolerated well.  There were no     reports of him going and smoking outside of the hospital.  The     patient seemed willing to try smoking cessation in the outpatient     setting.  MEDICATIONS ON DISCHARGE:  New medications on discharge: 1. Carvedilol 3.125 mg by mouth twice daily. 2. Fluticasone 50 mcg. 3. Folic acid 1 mg by mouth daily. 4. Lasix 40 mg by mouth twice in the morning and once in the     afternoon. 5. Lisinopril 5 mg by mouth daily. 6. Loratadine 10 mg by mouth daily. 7. Nicotine patch 21 mg. 8. Potassium chloride 20 mEq 2  tablets daily. 9. Thiamine 100 mg 1 tablet daily.  Same medications on discharge: 1. Clonazepam 0.5 mg by mouth twice daily. 2. Fish oil 1 capsule by mouth daily. 3. Ibuprofen 200 mg over the counter for severe headache. 4. Multivitamins over the counter. 5. Sertraline 100 mg 2 tablets by mouth daily. 6. Tylenol Extra Strength 500 mg. 7. Vitamin B complex over the counter.  Medications to stop on discharge: 1. Epinephrine 2. Primatene tablet. 3. Vitamin D over the counter. 4. Over-the-counter water pill.  THINGS TO FOLLOW UP ON: 1. Diuresis.  The patient still had edema in the lower extremities.     Diuresis was increased in the outpatient setting.  There is a     question of whether to start spironolactone for diuresis given the     patient's low potassium. 2. Checking potassium level after the patient having taken Lasix on a     regular basis. 3. Sodium to be monitored and considering stopping the Zoloft if     sodium persistently stays low.  DISCHARGE INSTRUCTIONS:  The patient was instructed to quit alcohol at all costs given how seriously ill he was.  The patient was strongly encouraged to seek outpatient rehab for this.  The patient was advised to return to clinic and to hospital if fever, increased difficulty breathing, if any new onset seizures would occur.  The patient was also instructed to keep taking his Klonopin and to stop it only after having been seen by a physician or provider.  FOLLOWUP APPOINTMENTS: 1. The patient was to have a followup appointment with Dr. Gwendolyn Grant at     [facility]. 2. He was also to follow up with [facility] for alcoholic rehab.  DISCHARGE CONDITION:  The patient was discharged to home in stable medical condition.    ______________________________ Marena Chancy, MD   ______________________________ Santiago Bumpers. Leveda Anna, M.D.    SL/MEDQ  D:  10/28/2010  T:  10/29/2010  Job:  [id number]  Electronically Signed by  Marena Chancy MD on 11/08/2010 03:07:21 AM Electronically Signed by Doralee Albino M.D. on 11/13/2010 01:49:36 PM

## 2010-11-19 ENCOUNTER — Encounter: Payer: Self-pay | Admitting: Family Medicine

## 2010-11-19 ENCOUNTER — Ambulatory Visit (INDEPENDENT_AMBULATORY_CARE_PROVIDER_SITE_OTHER): Payer: PRIVATE HEALTH INSURANCE | Admitting: Family Medicine

## 2010-11-19 VITALS — BP 120/83 | HR 95 | Temp 98.2°F | Ht 68.0 in | Wt 169.0 lb

## 2010-11-19 DIAGNOSIS — R5383 Other fatigue: Secondary | ICD-10-CM

## 2010-11-19 DIAGNOSIS — F1020 Alcohol dependence, uncomplicated: Secondary | ICD-10-CM

## 2010-11-19 DIAGNOSIS — I5040 Unspecified combined systolic (congestive) and diastolic (congestive) heart failure: Secondary | ICD-10-CM

## 2010-11-19 DIAGNOSIS — J309 Allergic rhinitis, unspecified: Secondary | ICD-10-CM

## 2010-11-19 DIAGNOSIS — J302 Other seasonal allergic rhinitis: Secondary | ICD-10-CM

## 2010-11-19 DIAGNOSIS — R5381 Other malaise: Secondary | ICD-10-CM

## 2010-11-19 DIAGNOSIS — E875 Hyperkalemia: Secondary | ICD-10-CM | POA: Insufficient documentation

## 2010-11-19 LAB — CBC
HCT: 47.8 % (ref 39.0–52.0)
Hemoglobin: 16 g/dL (ref 13.0–17.0)
MCH: 28.7 pg (ref 26.0–34.0)
MCHC: 33.5 g/dL (ref 30.0–36.0)
MCV: 85.7 fL (ref 78.0–100.0)

## 2010-11-19 LAB — BASIC METABOLIC PANEL
CO2: 26 mEq/L (ref 19–32)
Calcium: 9.2 mg/dL (ref 8.4–10.5)
Chloride: 98 mEq/L (ref 96–112)
Glucose, Bld: 117 mg/dL — ABNORMAL HIGH (ref 70–99)
Potassium: 4.3 mEq/L (ref 3.5–5.3)
Sodium: 135 mEq/L (ref 135–145)

## 2010-11-19 MED ORDER — FLUTICASONE PROPIONATE 50 MCG/ACT NA SUSP: 2.0000 | Freq: Every day | NASAL | Status: DC

## 2010-11-19 MED ORDER — LISINOPRIL 10 MG PO TABS: 10.0000 mg | ORAL_TABLET | Freq: Every day | ORAL | Status: DC

## 2010-11-19 MED ORDER — CARVEDILOL 3.125 MG PO TABS: 3.1250 mg | ORAL_TABLET | Freq: Two times a day (BID) | ORAL | Status: DC

## 2010-11-19 NOTE — Assessment & Plan Note (Signed)
Likely secondary to potassium supplements. Recheck in

## 2010-11-19 NOTE — Assessment & Plan Note (Signed)
Long discussion about this with patient. He is scheduled to see his psychiatrist 2 weeks from today. Encarnacion Slates that once the stress from work increases this may trigger his alcoholic behaviors again. He readily admits that stress was the trigger for his alcoholism the past. Discussed that having a support system in place prior to stresses as the best option. Patient states he would still like to see how your spot on his own and we'll preop with his psychiatrist any further interventions at that time. He does state that his psychiatrist has counseling available.

## 2010-11-19 NOTE — Patient Instructions (Signed)
We'll check some blood work today, looking at potassium, kidney function, other electrolytes. Increase your lisinopril to 10 mg a day. I'll send in a new prescription for this. Try either Zyrtec or Allegra for allergies. I'll also send in for a nasal spray called fluticasone. 600-800 mg of vitamin D is the daily recommendation. Come back and see me in about 3 weeks.

## 2010-11-19 NOTE — Progress Notes (Signed)
  Subjective:    Patient ID: Juan [name], male    DOB: 11-02-1950, 60 y.o.   MRN: [id number]  HPI 1.  Alcoholism:  Patient denies any current craving for alcoholic drinks. He is now working half days a week last week and this week. He states that stress from work is present but he is currently able to manage this. He'll be going back to full-time next week. No fevers, chills, abdominal swelling, lower extreme the swelling.  2. Congestive heart failure: Patient is a tolerating his medications well. As above he denies any lower extremity swelling. Denies any shortness of breath palpitations or chest pain. He is currently taking Coreg 3.25 mg daily and lisinopril 5 mg daily. Tolerating medicines well. His appetite is increasing well. His weight is hovering between 161 164 pounds at home. He also feels much stronger and he is ready to start physical therapy which is scheduled to start next week.  3. Hyperkalemia: Noticed a lab check. Called patient and discussed this with him. Stopped his potassium. He took Lasix 3 days longer after having his potassium checked. No myalgias.  4. Seasonal allergies: Long-term problem for patient. He has tried Claritin in the past and currently but states it is not really helping. Feels his flares mostly in the spring and fall. He describes multiple episodes of sneezing as well as constant nasal drainage. No coughing. Has also tried some over-the-counter nasal sprays with inconsistent relief.   Review of Systems See HPI above for review of systems.       Objective:   Physical Exam Gen:  Alert, cooperative patient who appears stated age in no acute distress.  Vital signs reviewed. HEENT:  [place]/AT.  EOMI, PERRL.  Nasal turbinates boggy and edematous, bluish appearing. MMM, tonsils non-erythematous, non-edematous.  External ears WNL, Bilateral TM's normal without retraction, redness or bulging.  Pulm:  Clear to auscultation bilaterally with good air movement.  No  wheezes or rales noted.   Cardiac:  Regular rate and rhythm without murmur auscultated.  Good S1/S2. Abd:  Soft/nondistended/nontender.  Good bowel sounds throughout all four quadrants.  Hepatomegaly noted. Ext:  No clubbing/cyanosis/erythema.  Trace edema noted bilateral lower extremities       Assessment & Plan:

## 2010-11-19 NOTE — Assessment & Plan Note (Signed)
We'll start patient on fluticasone.. Last note states that he was actually on fluticasone however in talking with the patient he states he's never taken it before. Will send a prescription for this now. Also try Zyrtec or Allegra for relief

## 2010-11-19 NOTE — Assessment & Plan Note (Signed)
Much improved. Has been doing chores and activities around the house. Feels much stronger now. He is scheduled to start physical therapy in a week.

## 2010-11-19 NOTE — Assessment & Plan Note (Signed)
Increase lisinopril today. I would like to increase lisinopril and carvedilol to maximum dosage. Might also benefit from Aldactone in the future especially as he has history of hepatomegaly and alcoholism.

## 2010-11-20 ENCOUNTER — Encounter: Payer: Self-pay | Admitting: Family Medicine

## 2010-12-10 ENCOUNTER — Ambulatory Visit (INDEPENDENT_AMBULATORY_CARE_PROVIDER_SITE_OTHER): Payer: PRIVATE HEALTH INSURANCE | Admitting: Family Medicine

## 2010-12-10 ENCOUNTER — Encounter: Payer: Self-pay | Admitting: Family Medicine

## 2010-12-10 VITALS — BP 127/80 | HR 84 | Ht 68.0 in | Wt 168.8 lb

## 2010-12-10 DIAGNOSIS — I5040 Unspecified combined systolic (congestive) and diastolic (congestive) heart failure: Secondary | ICD-10-CM

## 2010-12-10 DIAGNOSIS — E875 Hyperkalemia: Secondary | ICD-10-CM

## 2010-12-10 DIAGNOSIS — F32A Depression, unspecified: Secondary | ICD-10-CM

## 2010-12-10 DIAGNOSIS — J309 Allergic rhinitis, unspecified: Secondary | ICD-10-CM

## 2010-12-10 DIAGNOSIS — F3289 Other specified depressive episodes: Secondary | ICD-10-CM

## 2010-12-10 DIAGNOSIS — F329 Major depressive disorder, single episode, unspecified: Secondary | ICD-10-CM

## 2010-12-10 DIAGNOSIS — F1020 Alcohol dependence, uncomplicated: Secondary | ICD-10-CM

## 2010-12-10 LAB — BASIC METABOLIC PANEL
BUN: 14 mg/dL (ref 6–23)
Creat: 0.78 mg/dL (ref 0.50–1.35)
Glucose, Bld: 78 mg/dL (ref 70–99)
Potassium: 4.5 mEq/L (ref 3.5–5.3)

## 2010-12-10 NOTE — Progress Notes (Signed)
  Subjective:    Patient ID: Juan [name], male    DOB: [birth year], 60 y.o.   MRN: [id number]  HPI 1.  CHF FU:  Fatigue much improved.  Taking Lisinopril and Coreg without complications.  Still not doing physical therapy, states he is "considering this," walking at home but has not as much since weather changed.  No N/V, abdominal pain, palpitations, chest pain, LE edema.    2.  EtOH:  1 bottle of wine over weekend.  Had 1 glass Fri, Sat, Sun night which finished bottle.  Discussed this with psychiatrist, who according to patient told him he may be someone who can handle his alcohol without going "overboard."  No drinking during the week.  No DT's.    3.  Fatigue: much improved, see CHF above  4.  Preventative:  Discussed colonoscopy.  Patient would like to defer today and think about it.  Leaning more towards FOB testing.    5.  Allergic rhinits:  Tried Allegra without relief.  Switched to Zyrtec and has had some improvement, but rhinitis persists.     Review of Systems See HPI above for review of systems.       Objective:   Physical Exam Gen:  Alert, cooperative patient who appears stated age in no acute distress.  Vital signs reviewed. HEENT:  [place]/AT.  EOMI, PERRL.  MMM, tonsils non-erythematous, non-edematous.  External ears WNL, Bilateral TM's normal without retraction, redness or bulging.  Cardiac:  Regular rate and rhythm without murmur auscultated.  Good S1/S2. Pulm:  Clear to auscultation bilaterally with good air movement.  No wheezes or rales noted.   Abd:  No ascites Extr:  Trace BL edema        Assessment & Plan:

## 2010-12-10 NOTE — Patient Instructions (Signed)
After we check your labs, increase to 2 pills (20 mg) of Lisinopril. We're checking blood work today.   Come back and see me around the end of the month.   Think about the stool tests for screening.

## 2010-12-11 ENCOUNTER — Encounter: Payer: Self-pay | Admitting: Family Medicine

## 2010-12-11 ENCOUNTER — Telehealth: Payer: Self-pay | Admitting: Family Medicine

## 2010-12-11 NOTE — Telephone Encounter (Signed)
Called and left message on patient machine that he can go ahead and increase to 20 mg LIsionpril.  To call back if any questions.

## 2010-12-11 NOTE — Assessment & Plan Note (Signed)
Increased LIsionpril to 20 mg after normal K+ and creatinine function  Next step will be increasing Beta blocker  No need for aldactone as no ascites, EF not <30% currently.

## 2010-12-11 NOTE — Assessment & Plan Note (Signed)
Encouraged continued abstinence if possible, despite psych recommendations

## 2010-12-11 NOTE — Assessment & Plan Note (Signed)
Discussed decongestant formulations of Zyrtec, he will try these

## 2010-12-11 NOTE — Assessment & Plan Note (Signed)
Much improved since seeing psychiatry

## 2010-12-31 ENCOUNTER — Telehealth: Payer: Self-pay | Admitting: Family Medicine

## 2010-12-31 NOTE — Telephone Encounter (Signed)
Pt asking to speak with Dr. Gwendolyn Grant about his symptoms, thinks he has bronchitis and wanted an rx called in, advised pt he would need to be seen, pt cannot come today but is insisting I still send message to MD to see if he can send something until his appt Thursday.

## 2011-01-01 NOTE — Telephone Encounter (Signed)
Pt returned call from [facility]

## 2011-01-03 ENCOUNTER — Ambulatory Visit (INDEPENDENT_AMBULATORY_CARE_PROVIDER_SITE_OTHER): Payer: PRIVATE HEALTH INSURANCE | Admitting: Family Medicine

## 2011-01-03 ENCOUNTER — Encounter: Payer: Self-pay | Admitting: Family Medicine

## 2011-01-03 VITALS — BP 135/81 | HR 80 | Temp 98.9°F | Ht 68.0 in | Wt 163.4 lb

## 2011-01-03 DIAGNOSIS — J40 Bronchitis, not specified as acute or chronic: Secondary | ICD-10-CM | POA: Insufficient documentation

## 2011-01-03 DIAGNOSIS — F411 Generalized anxiety disorder: Secondary | ICD-10-CM

## 2011-01-03 DIAGNOSIS — F1020 Alcohol dependence, uncomplicated: Secondary | ICD-10-CM

## 2011-01-03 DIAGNOSIS — I5040 Unspecified combined systolic (congestive) and diastolic (congestive) heart failure: Secondary | ICD-10-CM

## 2011-01-03 DIAGNOSIS — F419 Anxiety disorder, unspecified: Secondary | ICD-10-CM

## 2011-01-03 MED ORDER — DOXYCYCLINE HYCLATE 100 MG PO TABS: 100.0000 mg | ORAL_TABLET | Freq: Two times a day (BID) | ORAL | Status: AC

## 2011-01-03 MED ORDER — LISINOPRIL 10 MG PO TABS: 20.0000 mg | ORAL_TABLET | Freq: Every day | ORAL | Status: DC

## 2011-01-03 MED ORDER — PREDNISONE 50 MG PO TABS: ORAL_TABLET | ORAL | Status: AC

## 2011-01-03 MED ORDER — ALBUTEROL 90 MCG/ACT IN AERS: 2.0000 | INHALATION_SPRAY | Freq: Four times a day (QID) | RESPIRATORY_TRACT | Status: DC | PRN

## 2011-01-03 NOTE — Telephone Encounter (Signed)
Will address in clinic this PM

## 2011-01-05 NOTE — Assessment & Plan Note (Signed)
Currently doing well. Want to followup with him after he is feeling better as well

## 2011-01-05 NOTE — Assessment & Plan Note (Signed)
Currently doing well. No cravings

## 2011-01-05 NOTE — Assessment & Plan Note (Signed)
Controlled continue to follow with psychiatry.

## 2011-01-05 NOTE — Progress Notes (Signed)
  Subjective:    Patient ID: Juan [name], male    DOB: [birth year], 60 y.o.   MRN: [id number]  HPI #1 URI symptoms. These have been present for one week. States that he began having malaise on Thursday Thanksgiving. Over the weekend he began feeling much worse. Had shortness of breath, cough productive of thick sputum, mild temperature to 100 and 101, difficulty sleeping at night due to to cough. Also nasal congestion and nasal drainage.  No chills. No nausea vomiting.  #2. CHF followup. This has been doing well and taking his medications as prescribed. Denies abdominal swelling. Denies any acute weight gain or weight change. No chest pain. No palpitations.  #3 alcoholism patient has not had anyone for the past 2 weeks. Is not craving alcohol this time.  #4. Anxiety: Patient is to need followup with his psychiatrist. He has a followup in 2 weeks appointment. Does not need any refills on Klonopin today. States this has been doing well controlling his anxiety. The fascia this everyday. He states he is doing well even though he is back at work which is the usual stressor for him  I have reviewed the patient's pertinent past medical, surgical, family, and social history, including fact patient is nonsmoker Review of Systems See HPI above for review of systems.       Objective:   Physical Exam Gen:  Alert, cooperative patient who appears stated age in no acute distress.  Does appear to feel poorl Head normocephalic/atraumatic Eyes no conjunctival or scleral injection Nose nasal turbinates inflamed bilaterally with exudates Mouth moist mucous membranes without swollen tonsils Neck no lymphadenopathy Cardiac:  Regular rate and rhythm without murmur auscultated.  Good S1/S2. Lungs: Diffuse wheezing in all lung fields. Ext:  Trace edema bilateral ankles Back: Profound kyphosis noted       Assessment & Plan:

## 2011-01-05 NOTE — Assessment & Plan Note (Signed)
Wonder if patient has some aspect of obstructive or maybe even restrictive lung disease to 2 his anatomy. He does have diffuse wheezing on exam today. No history of asthma. Plan to treat as if the patient has chronic bronchitis and this is a cute flare. We'll provide patient with inhaler (he states he has used this before the past but has not needed this in the past several years), steroids, doxycycline. Followup in 2 weeks.

## 2011-01-15 ENCOUNTER — Other Ambulatory Visit: Payer: Self-pay | Admitting: Family Medicine

## 2011-01-15 NOTE — Telephone Encounter (Signed)
Refill request

## 2011-04-09 ENCOUNTER — Other Ambulatory Visit: Payer: Self-pay | Admitting: Family Medicine

## 2011-04-09 NOTE — Telephone Encounter (Signed)
Patient is calling because he needs a refill on his Klonipin.  He has enough to finish today and would like a refill sent to [facility] on [address].  Please let him know when it is done.

## 2011-04-09 NOTE — Telephone Encounter (Signed)
Forwarded to pcp for advice.Juan [name] [place]  

## 2011-04-09 NOTE — Telephone Encounter (Signed)
Will refill this tomorrow.  Can fax it in at that time.

## 2011-04-10 NOTE — Telephone Encounter (Signed)
lvm informing pt to call back or that we will just see him at his appt on Friday.Juan [name]

## 2011-04-10 NOTE — Telephone Encounter (Signed)
Actually on reviewing his records, his psychiatrist has been filling Klonopin.  Do not want to double prescribe.  He has appt on Friday and we can discuss this further then or he can have his psychiatrist refill Klonopin.

## 2011-04-11 ENCOUNTER — Telehealth: Payer: Self-pay | Admitting: Family Medicine

## 2011-04-11 DIAGNOSIS — F419 Anxiety disorder, unspecified: Secondary | ICD-10-CM

## 2011-04-11 MED ORDER — CLONAZEPAM 0.5 MG PO TABS: 0.5000 mg | ORAL_TABLET | Freq: Every day | ORAL | Status: DC

## 2011-04-11 NOTE — Telephone Encounter (Addendum)
Patient called back and spoke with Britta Mccreedy asking about refills on Klonopin.  Paged Dr. Gwendolyn Grant and he didn't refill yesterday because he wanted to find out from patient if he was  getting this from psychiatrist. Tried to call patient , no answer. Called Dr. Gwendolyn Grant back and gave him message from patient about having seizures when he runs out of Klonopin. Dr. Gwendolyn Grant states we can send in RX for one daily to last until his appointment 04/06/2011. Rx called to pharmacy.  patient notified that rx has been sent in

## 2011-04-11 NOTE — Telephone Encounter (Signed)
Pt is returning call to [place] - thinks it's about his Klonopin - he ran out yesterday - needs this asap -last time he ran out he had a seizure.

## 2011-04-11 NOTE — Telephone Encounter (Signed)
Spoke with patient. He stated that he ran out of medication 2 days ago and he has seizures when he runs out. He had cancelled his appointment for tomorrow and rescheduled for 3/22. Informed him Archie Patten is not her this morning, but I will let her know

## 2011-04-12 ENCOUNTER — Ambulatory Visit: Payer: PRIVATE HEALTH INSURANCE | Admitting: Family Medicine

## 2011-04-22 ENCOUNTER — Other Ambulatory Visit: Payer: Self-pay | Admitting: Family Medicine

## 2011-04-22 NOTE — Telephone Encounter (Signed)
Refill request

## 2011-04-24 ENCOUNTER — Other Ambulatory Visit: Payer: Self-pay | Admitting: Family Medicine

## 2011-04-26 ENCOUNTER — Encounter: Payer: Self-pay | Admitting: Family Medicine

## 2011-04-26 ENCOUNTER — Ambulatory Visit (INDEPENDENT_AMBULATORY_CARE_PROVIDER_SITE_OTHER): Payer: BC Managed Care – PPO | Admitting: Family Medicine

## 2011-04-26 VITALS — BP 158/98 | HR 82 | Temp 98.7°F | Ht 68.6 in | Wt 170.0 lb

## 2011-04-26 DIAGNOSIS — F411 Generalized anxiety disorder: Secondary | ICD-10-CM

## 2011-04-26 DIAGNOSIS — F419 Anxiety disorder, unspecified: Secondary | ICD-10-CM

## 2011-04-26 DIAGNOSIS — I509 Heart failure, unspecified: Secondary | ICD-10-CM

## 2011-04-26 DIAGNOSIS — J302 Other seasonal allergic rhinitis: Secondary | ICD-10-CM

## 2011-04-26 DIAGNOSIS — I5040 Unspecified combined systolic (congestive) and diastolic (congestive) heart failure: Secondary | ICD-10-CM

## 2011-04-26 DIAGNOSIS — E785 Hyperlipidemia, unspecified: Secondary | ICD-10-CM

## 2011-04-26 DIAGNOSIS — R030 Elevated blood-pressure reading, without diagnosis of hypertension: Secondary | ICD-10-CM

## 2011-04-26 DIAGNOSIS — I10 Essential (primary) hypertension: Secondary | ICD-10-CM

## 2011-04-26 DIAGNOSIS — J309 Allergic rhinitis, unspecified: Secondary | ICD-10-CM

## 2011-04-26 DIAGNOSIS — IMO0001 Reserved for inherently not codable concepts without codable children: Secondary | ICD-10-CM | POA: Insufficient documentation

## 2011-04-26 LAB — COMPREHENSIVE METABOLIC PANEL
ALT: 24 U/L (ref 0–53)
BUN: 13 mg/dL (ref 6–23)
CO2: 24 mEq/L (ref 19–32)
Calcium: 9.3 mg/dL (ref 8.4–10.5)
Chloride: 93 mEq/L — ABNORMAL LOW (ref 96–112)
Creat: 0.82 mg/dL (ref 0.50–1.35)
Total Bilirubin: 0.8 mg/dL (ref 0.3–1.2)

## 2011-04-26 MED ORDER — SERTRALINE HCL 100 MG PO TABS: 100.0000 mg | ORAL_TABLET | Freq: Every day | ORAL | Status: DC

## 2011-04-26 MED ORDER — LISINOPRIL 10 MG PO TABS: 20.0000 mg | ORAL_TABLET | Freq: Every day | ORAL | Status: DC

## 2011-04-26 MED ORDER — CARVEDILOL 3.125 MG PO TABS: 3.1250 mg | ORAL_TABLET | Freq: Two times a day (BID) | ORAL | Status: DC

## 2011-04-26 MED ORDER — CLONAZEPAM 0.5 MG PO TABS: 0.5000 mg | ORAL_TABLET | Freq: Two times a day (BID) | ORAL | Status: DC | PRN

## 2011-04-26 MED ORDER — FLUTICASONE PROPIONATE 50 MCG/ACT NA SUSP: 2.0000 | Freq: Every day | NASAL | Status: DC

## 2011-04-26 NOTE — Progress Notes (Signed)
  Subjective:    Patient ID: Juan [name], male    DOB: November 16, 1950, 61 y.o.   MRN: [id number]  HPI #1. Followup for congestive heart failure: Patient returns today for followup for congestive heart failure. He is currently doing well and without complaints. He is weighing himself every one to 2 days. He does have a heart failure action plan we will take his Lasix when he has noticed a 2 pound weight gain 2 days in a row. However he is not taking for his meds last saw him. He has not had any chest pain or dyspnea on exertion. He is taking his medications as prescribed except for his lisinopril which has been out of for the past week or so.  #2. Anxiety: Patient recently lost his job in December. This is actually a long process. His last x-ray work was in January. He states he was doing poorly for the first 3 weeks because of such a surprise. He has not started drinking again. However he notes that this is an opportunity and perhaps the best thing that has happened to him. He states is a very poor work environment. He is much happier now in looking for another job. His anxieties are somewhat decreased. He has had insurance changed and is now on his wife's insurance. He is not being followed by psychology anymore. He would like for me to follow and prescribe his sertraline and Klonopin. Last Klonopin was this morning. No seizures or mental status changes.  #3. Allergic rhinitis: Process. This is better with Claritin and fluticasone. He has been out of his fluticasone for the past 4 days as well. He has definitely noticed a difference. No fevers or chills. No allergic conjunctivitis. Nasal congestion and drainage are his main complaints for this.  Elevated blood pressures: He has not had trouble this for the past. He has been out of his lisinopril for the past 4 or 5 days.  No adverse effects from medication.  Checks it regularly at home read as been controlled except the past 2-3 days but has been running  in the 140s to 150s systolic.  No HA, CP, dizziness, shortness of breath, palpitations, or LE swelling.   BP Readings from Last 3 Encounters:  04/26/11 158/98  01/03/11 135/81  12/10/10 127/80    The following portions of the patient's history were reviewed and updated as appropriate: allergies, current medications, past medical history, family and social history, and problem list.  Patient is a nonsmoker.   Review of Systems See HPI above for review of systems.       Objective:   Physical Exam  Gen:  Alert, cooperative patient who appears stated age in no acute distress.  Vital signs reviewed. Patient is kyphotic. HEENT:  [place]/AT.  EOMI, PERRL.  MMM, tonsils non-erythematous, non-edematous.  External ears WNL, Bilateral TM's normal without retraction, redness or bulging.  Neck:  Pulm:  Mild wheezing throughout. Moderate air movement.   Cardiac:  Regular rate and rhythm without grade 1/6 murmur noted bilateral sternal borders. Abd:  Soft/nondistended/nontender.  Good bowel sounds throughout all four quadrants. Stable hepatomegaly Ext:  No clubbing/cyanosis/erythema.  No edema noted bilateral lower extremities.   Neuro:  Grossly normal, no gait abnormalities Psych:  Not depressed or anxious appearing.  Conversant and engaged       Assessment & Plan:

## 2011-04-26 NOTE — Assessment & Plan Note (Signed)
Refilled his to do them today.

## 2011-04-26 NOTE — Patient Instructions (Signed)
I have refilled your medications.  Call and let me know about the blood pressure in 2 weeks.  It was good to see you. Come back and see me in 3 months.

## 2011-04-26 NOTE — Assessment & Plan Note (Signed)
Will repeat echo later this year. Her lip in roughly one year from last echo in September. He is doing well without edema or dyspnea. Continue with heart failure action plan. I discussed obtaining cardiologist the patient is reluctant at this time

## 2011-04-26 NOTE — Assessment & Plan Note (Signed)
No actual diagnosis of hypertension as of yet. Refilled his lisinopril. Obtaining metabolic profile, CBC, LDL today.

## 2011-04-26 NOTE — Assessment & Plan Note (Addendum)
Stable. I will gladly take over his Klonopin and sertraline refills. His demonstrated remaining off of alcohol even in the face of adversity (losing his job). He is looking at is very positive light.

## 2011-04-27 LAB — CBC
HCT: 43.8 % (ref 39.0–52.0)
Hemoglobin: 15 g/dL (ref 13.0–17.0)
MCV: 94 fL (ref 78.0–100.0)
WBC: 11.1 10*3/uL — ABNORMAL HIGH (ref 4.0–10.5)

## 2011-04-29 ENCOUNTER — Encounter: Payer: Self-pay | Admitting: Family Medicine

## 2011-05-30 ENCOUNTER — Other Ambulatory Visit: Payer: Self-pay | Admitting: Family Medicine

## 2011-07-04 ENCOUNTER — Other Ambulatory Visit: Payer: Self-pay | Admitting: Family Medicine

## 2011-07-04 DIAGNOSIS — F419 Anxiety disorder, unspecified: Secondary | ICD-10-CM

## 2011-07-04 MED ORDER — CLONAZEPAM 0.5 MG PO TABS: 0.5000 mg | ORAL_TABLET | Freq: Two times a day (BID) | ORAL | Status: DC | PRN

## 2011-07-05 ENCOUNTER — Encounter: Payer: Self-pay | Admitting: Family Medicine

## 2011-07-05 NOTE — Telephone Encounter (Signed)
Error

## 2011-07-05 NOTE — Telephone Encounter (Signed)
This encounter was created in error - please disregard.

## 2011-07-15 ENCOUNTER — Other Ambulatory Visit: Payer: Self-pay | Admitting: Family Medicine

## 2011-07-30 ENCOUNTER — Ambulatory Visit (INDEPENDENT_AMBULATORY_CARE_PROVIDER_SITE_OTHER): Payer: BC Managed Care – PPO | Admitting: Family Medicine

## 2011-07-30 ENCOUNTER — Encounter: Payer: Self-pay | Admitting: Family Medicine

## 2011-07-30 VITALS — BP 164/97 | HR 98 | Temp 98.5°F | Ht 68.6 in | Wt 171.0 lb

## 2011-07-30 DIAGNOSIS — I10 Essential (primary) hypertension: Secondary | ICD-10-CM | POA: Insufficient documentation

## 2011-07-30 DIAGNOSIS — F411 Generalized anxiety disorder: Secondary | ICD-10-CM

## 2011-07-30 DIAGNOSIS — J40 Bronchitis, not specified as acute or chronic: Secondary | ICD-10-CM

## 2011-07-30 DIAGNOSIS — R251 Tremor, unspecified: Secondary | ICD-10-CM

## 2011-07-30 DIAGNOSIS — F419 Anxiety disorder, unspecified: Secondary | ICD-10-CM

## 2011-07-30 DIAGNOSIS — J309 Allergic rhinitis, unspecified: Secondary | ICD-10-CM

## 2011-07-30 DIAGNOSIS — F1020 Alcohol dependence, uncomplicated: Secondary | ICD-10-CM

## 2011-07-30 DIAGNOSIS — R259 Unspecified abnormal involuntary movements: Secondary | ICD-10-CM

## 2011-07-30 HISTORY — DX: Tremor, unspecified: R25.1

## 2011-07-30 MED ORDER — DESLORATADINE 5 MG PO TABS: 5.0000 mg | ORAL_TABLET | Freq: Every day | ORAL | Status: DC

## 2011-07-30 MED ORDER — LISINOPRIL 10 MG PO TABS: 20.0000 mg | ORAL_TABLET | Freq: Every day | ORAL | Status: DC

## 2011-07-30 MED ORDER — IPRATROPIUM BROMIDE HFA 17 MCG/ACT IN AERS: 2.0000 | INHALATION_SPRAY | Freq: Four times a day (QID) | RESPIRATORY_TRACT | Status: DC

## 2011-07-30 MED ORDER — CLONAZEPAM 0.5 MG PO TABS: 0.5000 mg | ORAL_TABLET | Freq: Two times a day (BID) | ORAL | Status: DC | PRN

## 2011-07-30 MED ORDER — CARVEDILOL 3.125 MG PO TABS: 3.1250 mg | ORAL_TABLET | Freq: Two times a day (BID) | ORAL | Status: DC

## 2011-07-30 NOTE — Progress Notes (Signed)
Patient ID: Juan [name], male   DOB: Jun 05, 1950, 61 y.o.   MRN: [id number] Juan [name] is a 61 y.o. male who presents to [facility] today for follow-up of chronic issues:  1.  Allergies:  Acutely worsened for past 1-2 months. He is using Claritin on an daily basis without relief. He is not uses Flonase for the past 6 weeks at least. Describes daily rhinorrhea and allergic conjunctivitis symptoms. No fevers or chills. No cough.  2.   Hypertension:  Long-term problem for this patient.  No adverse effects from medication.  Not checking it regularly.  No HA, CP, dizziness, shortness of breath, palpitations, or LE swelling.  Has been out of his medication for about the past 2 weeks.  Also only taking 1 Lisinopril versus 2.   BP Readings from Last 3 Encounters:  07/30/11 164/97  04/26/11 158/98  01/03/11 135/81   3.  Tremor:  This is actually a new problem for the patient. States acutely worsened albuterol. He is using his albuterol refill a daily basis. He states this does relieve his shortness of breath she can experience after exertion. Is a long-time problem for the patient prior to even his hospitalization. Has not tried anything else for relief. Please see problem list regarding alcohol use currently.   The following portions of the patient's history were reviewed and updated as appropriate: allergies, current medications, past medical history, family and social history, and problem list.  Patient is a nonsmoker.   ROS as above otherwise neg. No Chest pain, palpitations, SOB, Fever, Chills, Abd pain, N/V/D.  Medications reviewed. Current Outpatient Prescriptions  Medication Sig Dispense Refill  . carvedilol (COREG) 3.125 MG tablet Take 1 tablet (3.125 mg total) by mouth 2 (two) times daily with a meal.  90 tablet  1  . clonazePAM (KLONOPIN) 0.5 MG tablet Take 1 tablet (0.5 mg total) by mouth 2 (two) times daily as needed for anxiety.  60 tablet  1  . [facility] NTS STEP 1 21 MG/24HR patch         . desloratadine (CLARINEX) 5 MG tablet Take 1 tablet (5 mg total) by mouth daily.  30 tablet  2  . fluticasone (FLONASE) 50 MCG/ACT nasal spray Place 2 sprays into the nose daily.  16 g  12  . folic acid (FOLVITE) 1 MG tablet       . furosemide (LASIX) 40 MG tablet       . lisinopril (PRINIVIL,ZESTRIL) 10 MG tablet Take 2 tablets (20 mg total) by mouth daily.  90 tablet  2  . potassium chloride SA (K-DUR,KLOR-CON) 20 MEQ tablet       . sertraline (ZOLOFT) 100 MG tablet Take 1 tablet (100 mg total) by mouth daily.  90 tablet  1  . VENTOLIN HFA 108 (90 BASE) MCG/ACT inhaler INHALE TWO PUFFS EVERY 6 HOURS AS NEEDED FOR WHEEZING  1 Inhaler  3  . DISCONTD: albuterol (PROVENTIL,VENTOLIN) 90 MCG/ACT inhaler Inhale 2 puffs into the lungs every 6 (six) hours as needed for wheezing.  17 g  0  . DISCONTD: carvedilol (COREG) 3.125 MG tablet Take 1 tablet (3.125 mg total) by mouth 2 (two) times daily with a meal.  90 tablet  1  . DISCONTD: clonazePAM (KLONOPIN) 0.5 MG tablet Take 1 tablet (0.5 mg total) by mouth 2 (two) times daily as needed for anxiety.  20 tablet  0  . DISCONTD: lisinopril (PRINIVIL,ZESTRIL) 10 MG tablet TAKE TWO TABLETS BY MOUTH DAILY  90 tablet  0    Exam:  BP 164/97  Pulse 98  Temp 98.5 F (36.9 C) (Oral)  Ht 5' 8.6" (1.742 m)  Wt 171 lb (77.565 kg)  BMI 25.55 kg/m2 Gen: Well NAD HEENT: EOMI,  MMM Lungs: CTABL Nl WOB Heart: RRR no MRG Abd: NABS, NT, ND Exts: Non edematous BL  LE, warm and well perfused.   No results found for this or any previous visit (from the past 72 hour(s)).

## 2011-07-30 NOTE — Assessment & Plan Note (Signed)
Resolved, but still with persistent wheezing.   Switch to Atrovent to decrease tremors.  Needs PFTs

## 2011-07-30 NOTE — Patient Instructions (Addendum)
I have refilled your medications today.  The new prescription medicine for your allergies is called Clarinex.   Keep using the Flonase. If you ever feel you need help with further resources I am here for you.  It was good to see you again today.

## 2011-07-30 NOTE — Assessment & Plan Note (Signed)
He is not taking his lisinopril as prescribed. Increase this to 2 pills a day dose of 20 mg. Has also been out of his medication for about 6 weeks possibly longer. Refilled his medications today. Followup for blood pressure check in one month.

## 2011-07-30 NOTE — Assessment & Plan Note (Signed)
Refill of Klonopin and Zoloft today. This control says anxiety. This is a regimen initiated by his psychiatrist.

## 2011-07-30 NOTE — Assessment & Plan Note (Signed)
Juan [name] is drinking again. He is CAGE questionaire x 1 = positive to getting annoyed by his wife asking him to cut back He states he drinks in 2-3 mixed drinks a day along with a glass of wine. We did discuss that this might be affecting his tremor. He states he is the resources available to quit and is up and quitting. He states his wife works for hospice to go to them for resources to quit alcohol use. Discuss with him that I'm here for him if he needs any further resources. Patient appreciated this but is not open to my this time.

## 2011-07-30 NOTE — Assessment & Plan Note (Signed)
Attempting Clarinex today. He has not had much relief with Claritin. Previous unable to have Clarinex covered by insurance recommended he try switching to Zyrtec. Also recommended daily use of Flonase for the next 4-6 weeks during the high season of allergy.

## 2011-09-11 ENCOUNTER — Other Ambulatory Visit: Payer: Self-pay | Admitting: Family Medicine

## 2011-09-23 ENCOUNTER — Other Ambulatory Visit: Payer: Self-pay | Admitting: Family Medicine

## 2011-10-21 ENCOUNTER — Other Ambulatory Visit: Payer: Self-pay | Admitting: Family Medicine

## 2011-11-04 ENCOUNTER — Other Ambulatory Visit: Payer: Self-pay | Admitting: *Deleted

## 2011-11-04 MED ORDER — SERTRALINE HCL 100 MG PO TABS: 100.0000 mg | ORAL_TABLET | Freq: Every day | ORAL | Status: DC

## 2011-11-08 ENCOUNTER — Telehealth: Payer: Self-pay | Admitting: *Deleted

## 2011-11-08 NOTE — Telephone Encounter (Signed)
LVMOM for patient to call back to ask him about below.

## 2011-11-08 NOTE — Telephone Encounter (Signed)
This is new to me as well.  I understood him to be taking 100 mg a day.  Maybe he was taking 50 mg tablets before?  His psychiatrist has been prescribing these medications in the past, maybe he increased the dosage.  For now, will keep him on 100 mg.  Will forward to [facility] to call him and ask.   Thanks!  Trey Paula

## 2011-11-08 NOTE — Telephone Encounter (Signed)
[facility] calling in regards to [name] Zoloft.  They state the RX they have is for Zoloft 100mg  one daily but they patient told them he take two tablets daily.  They need a new RX if this is the case.  I do not see that he is on two tablets daily in his chart.  Will forward to Dr. Gwendolyn Grant for review.  [name]

## 2011-11-11 NOTE — Telephone Encounter (Signed)
LVMOM for patient or his wife to call back and schedule appointment with Dr. Gwendolyn Grant

## 2011-11-11 NOTE — Telephone Encounter (Signed)
Yes he should come back in. He's had low sodium (not very low, but a little low) and high doses of anti-depressants like Zoloft can push this farther down.  I would be hesitant to increase him because of this.  Would like to discuss this in person with him. Thanks

## 2011-11-11 NOTE — Telephone Encounter (Signed)
Spoke with patient's wife she says that patient has always been taking the Zolfot 100mg  2 times a day. I read her off the prescription that is current and historical and she still says there is an issue with this. I also read her off the Klonopin rx also where it shows that he is due for a visit for the refill on this. She was talking about a NP that prescribed this, but there is nothing on file about this. I am confused and so are they. Would you like for them to come in to discuss this and figure it out?

## 2011-11-12 NOTE — Telephone Encounter (Signed)
Called and discussed the concern of the higher dosage of meds for this pt and told him that Dr. Gwendolyn Grant would like for him to come in to be seen before making a decision on whether or not to increase this med due to the lowering of sodium levels. Asked pt if he would like for me to schedule an appt for him now, he stated that he will need to check his schedule and give our office a call back to make an appt for this.Loralee Pacas [place]

## 2011-11-26 ENCOUNTER — Other Ambulatory Visit: Payer: Self-pay | Admitting: Family Medicine

## 2011-12-20 ENCOUNTER — Telehealth: Payer: Self-pay | Admitting: Family Medicine

## 2011-12-20 NOTE — Telephone Encounter (Signed)
Patient dropped off form to be filled out for insurance.  Please call when completed. °

## 2011-12-23 ENCOUNTER — Other Ambulatory Visit: Payer: Self-pay | Admitting: Family Medicine

## 2011-12-23 NOTE — Telephone Encounter (Signed)
Left message for Juan [name] at [phone] that forms are completed and ready to be picked up at front desk.  [name]

## 2012-02-16 ENCOUNTER — Other Ambulatory Visit: Payer: Self-pay | Admitting: Family Medicine

## 2012-04-13 ENCOUNTER — Other Ambulatory Visit: Payer: Self-pay | Admitting: *Deleted

## 2012-04-13 MED ORDER — IPRATROPIUM BROMIDE HFA 17 MCG/ACT IN AERS: 2.0000 | INHALATION_SPRAY | Freq: Three times a day (TID) | RESPIRATORY_TRACT | Status: DC | PRN

## 2012-05-15 ENCOUNTER — Other Ambulatory Visit: Payer: Self-pay | Admitting: Family Medicine

## 2012-05-20 ENCOUNTER — Other Ambulatory Visit: Payer: Self-pay | Admitting: Family Medicine

## 2012-06-01 ENCOUNTER — Ambulatory Visit: Payer: BC Managed Care – PPO | Admitting: Family Medicine

## 2012-06-12 ENCOUNTER — Ambulatory Visit: Payer: BC Managed Care – PPO | Admitting: Family Medicine

## 2012-06-22 ENCOUNTER — Other Ambulatory Visit: Payer: Self-pay | Admitting: Family Medicine

## 2012-06-22 DIAGNOSIS — I10 Essential (primary) hypertension: Secondary | ICD-10-CM

## 2012-06-30 ENCOUNTER — Ambulatory Visit: Payer: BC Managed Care – PPO | Admitting: Family Medicine

## 2012-07-20 ENCOUNTER — Encounter: Payer: Self-pay | Admitting: Family Medicine

## 2012-07-20 ENCOUNTER — Ambulatory Visit (INDEPENDENT_AMBULATORY_CARE_PROVIDER_SITE_OTHER): Payer: BC Managed Care – PPO | Admitting: Family Medicine

## 2012-07-20 VITALS — BP 144/83 | HR 97 | Temp 99.3°F | Ht 68.0 in | Wt 180.9 lb

## 2012-07-20 DIAGNOSIS — F329 Major depressive disorder, single episode, unspecified: Secondary | ICD-10-CM

## 2012-07-20 DIAGNOSIS — F3289 Other specified depressive episodes: Secondary | ICD-10-CM

## 2012-07-20 DIAGNOSIS — F32A Depression, unspecified: Secondary | ICD-10-CM

## 2012-07-20 DIAGNOSIS — F419 Anxiety disorder, unspecified: Secondary | ICD-10-CM

## 2012-07-20 DIAGNOSIS — I10 Essential (primary) hypertension: Secondary | ICD-10-CM

## 2012-07-20 DIAGNOSIS — F411 Generalized anxiety disorder: Secondary | ICD-10-CM

## 2012-07-20 DIAGNOSIS — F1020 Alcohol dependence, uncomplicated: Secondary | ICD-10-CM

## 2012-07-20 DIAGNOSIS — K429 Umbilical hernia without obstruction or gangrene: Secondary | ICD-10-CM

## 2012-07-20 DIAGNOSIS — F17200 Nicotine dependence, unspecified, uncomplicated: Secondary | ICD-10-CM

## 2012-07-20 LAB — COMPREHENSIVE METABOLIC PANEL
Albumin: 4 g/dL (ref 3.5–5.2)
Alkaline Phosphatase: 84 U/L (ref 39–117)
BUN: 10 mg/dL (ref 6–23)
CO2: 24 mEq/L (ref 19–32)
Glucose, Bld: 107 mg/dL — ABNORMAL HIGH (ref 70–99)
Potassium: 4.7 mEq/L (ref 3.5–5.3)
Sodium: 126 mEq/L — ABNORMAL LOW (ref 135–145)
Total Bilirubin: 0.8 mg/dL (ref 0.3–1.2)
Total Protein: 6.6 g/dL (ref 6.0–8.3)

## 2012-07-20 LAB — CBC
Hemoglobin: 13.4 g/dL (ref 13.0–17.0)
MCH: 31.5 pg (ref 26.0–34.0)
Platelets: 235 10*3/uL (ref 150–400)
RBC: 4.26 MIL/uL (ref 4.22–5.81)
WBC: 8.4 10*3/uL (ref 4.0–10.5)

## 2012-07-20 LAB — LDL CHOLESTEROL, DIRECT: Direct LDL: 74 mg/dL

## 2012-07-20 MED ORDER — CLONAZEPAM 0.5 MG PO TABS: 0.5000 mg | ORAL_TABLET | Freq: Two times a day (BID) | ORAL | Status: DC | PRN

## 2012-07-20 MED ORDER — DESLORATADINE 5 MG PO TABS: 5.0000 mg | ORAL_TABLET | Freq: Every day | ORAL | Status: DC

## 2012-07-20 MED ORDER — LISINOPRIL 10 MG PO TABS: ORAL_TABLET | ORAL | Status: DC

## 2012-07-20 MED ORDER — FLUTICASONE PROPIONATE 50 MCG/ACT NA SUSP: NASAL | Status: DC

## 2012-07-20 MED ORDER — CARVEDILOL 3.125 MG PO TABS: 3.1250 mg | ORAL_TABLET | Freq: Two times a day (BID) | ORAL | Status: DC

## 2012-07-20 MED ORDER — SERTRALINE HCL 100 MG PO TABS: 100.0000 mg | ORAL_TABLET | Freq: Every day | ORAL | Status: DC

## 2012-07-20 MED ORDER — IPRATROPIUM BROMIDE HFA 17 MCG/ACT IN AERS: 2.0000 | INHALATION_SPRAY | Freq: Three times a day (TID) | RESPIRATORY_TRACT | Status: DC | PRN

## 2012-07-20 NOTE — Patient Instructions (Signed)
It was good to see you again today.  We will get you set up to see the surgeon to talk about your hernia.  Bloodwork today.  We will send this to your psychiatrist to discuss about your sodium levels and your sertraline.  It was good to see you again today!

## 2012-07-21 ENCOUNTER — Encounter: Payer: Self-pay | Admitting: Family Medicine

## 2012-07-22 DIAGNOSIS — Z720 Tobacco use: Secondary | ICD-10-CM | POA: Insufficient documentation

## 2012-07-22 NOTE — Assessment & Plan Note (Signed)
Not currently obstructed nor incarcerated, reducible on my examination. I recommended he follow up with general surgery to have this evaluated.  It was my recommendation this be repaired, noting of course that smaller hernias are more dangerous; however the size of this one and the minor skin breakdown are of concern to me.   Referral placed today.

## 2012-07-22 NOTE — Assessment & Plan Note (Signed)
Sodium 128 last visit.  This was reasoning for decreased dosage of sertraline.  Recheck sodium was 126 -- likely secondary to chronic alcoholism. Would recommend possibly switching to another SSRI.  Patient has appt with psych next week.  I will mail copy of results so that he can show psychiatrist sodium levels.

## 2012-07-22 NOTE — Assessment & Plan Note (Signed)
Counseled to quit fully. Pre-contemplative essentially, states he has resources available when he is ready.

## 2012-07-22 NOTE — Assessment & Plan Note (Signed)
Based on age, at goal.   No med changes today.

## 2012-07-22 NOTE — Progress Notes (Signed)
Subjective:    Juan [name] is a 62 y.o. male who presents to [facility] today for several concerns:  1.  Depression:  Acutely worsened in March/April.  Started back seeing psychiatrist, whom he had not seen for several months prior.  Is on 100 mg Zoloft, previously he had been on 200 mg daily for past >10 years.  Questioned why I had decreased this.  Currently denies any SI/HI.  No anxiety/panic attacks.  Feels depressed most days, but has improved some since starting back with psychiatrist.    2.  Umbilical hernia:  Present "for years," acutely worsened since Sept/Oct of 2013 when he had severe coughing episode and "blew it out," meaning it became acutely worsened.  No worsening since that time.  Eating and drinking well.  Passing flatus and bowels well.  No nausea or vomiting.  No pain in hernia.    Prev health:  Patient is up to date on vaccines.  Currently overdue for colonoscopy.  Declines any referrals today, wants to "think about" when to schedule this.  The following portions of the patient's history were reviewed and updated as appropriate: allergies, current medications, past medical history, family and social history, and problem list. Patient is a  Current everyday smoker but declines any information on smoking cessation.    PMH reviewed.  Past Medical History  Diagnosis Date  . CHF (congestive heart failure) 10/2010    ECHO:  EF 40%, Grade II diastolic dysfunction  . Alcoholism   . Allergy     seasonal   . Anxiety   . Depression    No past surgical history on file.  Medications reviewed. Current Outpatient Prescriptions  Medication Sig Dispense Refill  . carvedilol (COREG) 3.125 MG tablet Take 1 tablet (3.125 mg total) by mouth 2 (two) times daily with a meal.  90 tablet  1  . clonazePAM (KLONOPIN) 0.5 MG tablet Take 1 tablet (0.5 mg total) by mouth 2 (two) times daily as needed for anxiety.  60 tablet  1  . desloratadine (CLARINEX) 5 MG tablet Take 1 tablet (5 mg total) by  mouth daily.  30 tablet  2  . fluticasone (FLONASE) 50 MCG/ACT nasal spray USE TWO SPRAYS IN EACH NOSTRIL DAILY  16 g  2  . ipratropium (ATROVENT HFA) 17 MCG/ACT inhaler Inhale 2 puffs into the lungs 3 (three) times daily as needed for wheezing.  1 Inhaler  3  . lisinopril (PRINIVIL,ZESTRIL) 10 MG tablet TAKE TWO TABLETS BY MOUTH DAILY  60 tablet  0  . sertraline (ZOLOFT) 100 MG tablet Take 1 tablet (100 mg total) by mouth daily.  30 tablet  0  . [DISCONTINUED] albuterol (PROVENTIL,VENTOLIN) 90 MCG/ACT inhaler Inhale 2 puffs into the lungs every 6 (six) hours as needed for wheezing.  17 g  0   No current facility-administered medications for this visit.    ROS as above otherwise neg.  No chest pain, palpitations, SOB, Fever, Chills, Abd pain, N/V/D.   Objective:   Physical Exam BP 144/83  Pulse 97  Temp(Src) 99.3 F (37.4 C) (Oral)  Ht 5\' 8"  (1.727 m)  Wt 180 lb 14.4 oz (82.056 kg)  BMI 27.51 kg/m2 Gen:  Alert, cooperative patient who appears stated age in no acute distress.  Vital signs reviewed. HEENT: EOMI,  MMM Cardiac:  Regular rate and rhythm  Pulm:  Diminished breath sounds with diffuse wheezing noted.    Abd:  Soft/nondistended/nontender.  Some hepatomegaly noted by percussion.  8 cm in  diameter, reducible umbilical hernia noted.  Does have some area of skin breakdown, no bleeding or drainage however.   Exts: Non edematous BL  LE, warm and well perfused.  Psych:  Somewhat flat affect, but otherwise no overt symptoms of depression/anxiety.  No hallucinations, linear and coherent thought process.

## 2012-07-22 NOTE — Assessment & Plan Note (Signed)
Functional alcoholic.  States he has "cut back" but CAGE positive for "annoyance" and "need to cut back further." Declined any resources today, stating that he has plenty of these at home and is already seeing a psychiatrist. Counseled appropriately and encouraged to continue contemplating full cessation.

## 2012-07-27 ENCOUNTER — Ambulatory Visit (INDEPENDENT_AMBULATORY_CARE_PROVIDER_SITE_OTHER): Payer: BC Managed Care – PPO | Admitting: General Surgery

## 2012-08-06 ENCOUNTER — Ambulatory Visit (INDEPENDENT_AMBULATORY_CARE_PROVIDER_SITE_OTHER): Payer: BC Managed Care – PPO | Admitting: General Surgery

## 2012-08-06 ENCOUNTER — Encounter (INDEPENDENT_AMBULATORY_CARE_PROVIDER_SITE_OTHER): Payer: Self-pay | Admitting: General Surgery

## 2012-08-06 VITALS — BP 138/90 | HR 90 | Temp 98.7°F | Resp 24 | Ht 68.0 in | Wt 179.0 lb

## 2012-08-06 DIAGNOSIS — K420 Umbilical hernia with obstruction, without gangrene: Secondary | ICD-10-CM

## 2012-08-06 DIAGNOSIS — K429 Umbilical hernia without obstruction or gangrene: Secondary | ICD-10-CM

## 2012-08-06 HISTORY — DX: Umbilical hernia without obstruction or gangrene: K42.9

## 2012-08-06 NOTE — Patient Instructions (Signed)
You have a very large, incarcerated umbilical ventral hernia. I am unable to reduce this completely. There is a risk that this could become strangulated and require emergency surgery.  You are advised to have an operation to repair this electively.  You have skin breakdown overlying the hernia. This increases the risk of infection. Please treat this with Neosporin and soft gauze nonstick bandages 3 times a day  You are strongly urged to stop smoking. This increases yourrisk for surgery  You are strongly urged to stop drinking.  You will be scheduled for a CT scan of the abdomen and pelvis to [place] evaluate the hernia and the intra-abdominal contents.  We will draw blood work to check your blood clotting studies  You are advised to see your primary care physician for medical and cardiac clearance for surgery  Return to see Dr. Derrell Lolling after all these tests and evaluations are done.

## 2012-08-06 NOTE — Progress Notes (Signed)
Patient ID: Juan [name], male   DOB: 1950-03-21, 62 y.o.   MRN: [id number]  Chief Complaint  Patient presents with  . Umbilical Hernia    HPI Juan [name] is a 62 y.o. male.  He is referred by Dr. Dionicia Abler, [facility], for evaluation of a complex, incarcerated umbilical hernia.  This patient states that he has had an umbilical hernia for 7 years. He denies pain. He's had some skin breakdown. This has been enlarging. Denies nausea vomiting or change in his bowel habits.  He has significant comorbidities including chronic anxiety and depression, followed by a psychiatrist. He is overdue for colonoscopy but has declined to have that done. He is a chronic tobacco abuser but has declined a smoking cessation program. He is a daily alcohol use or drinking 4 mixed drinks per day. Hypertension. Congestive heart failure with ejection fraction 40%.Denies history of cirrhosis or ascites.  Recent lab work shows hemoglobin 13.4, platelet count 235, AST 46 but other LFTs normal. Sodium 126, chloride 91, potassium 4.7.  He is in no distress today.  HPI  Past Medical History  Diagnosis Date  . CHF (congestive heart failure) 10/2010    ECHO:  EF 40%, Grade II diastolic dysfunction  . Alcoholism   . Allergy     seasonal   . Anxiety   . Depression   . Asthma   . Hyperlipidemia   . Hypertension   . Seizures     Past Surgical History  Procedure Laterality Date  . Removal of ingrown toenail      Family History  Problem Relation Age of Onset  . Alzheimer's disease Mother   . Diabetes Mother     Social History History  Substance Use Topics  . Smoking status: Current Every Day Smoker -- 1.00 packs/day for 40 years    Types: Cigarettes  . Smokeless tobacco: Not on file  . Alcohol Use: Yes    No Known Allergies  Current Outpatient Prescriptions  Medication Sig Dispense Refill  . carvedilol (COREG) 3.125 MG tablet Take 1 tablet (3.125 mg total) by mouth  2 (two) times daily with a meal.  90 tablet  1  . clonazePAM (KLONOPIN) 0.5 MG tablet Take 1 tablet (0.5 mg total) by mouth 2 (two) times daily as needed for anxiety.  60 tablet  1  . desloratadine (CLARINEX) 5 MG tablet Take 1 tablet (5 mg total) by mouth daily.  30 tablet  2  . fluticasone (FLONASE) 50 MCG/ACT nasal spray USE TWO SPRAYS IN EACH NOSTRIL DAILY  16 g  2  . ipratropium (ATROVENT HFA) 17 MCG/ACT inhaler Inhale 2 puffs into the lungs 3 (three) times daily as needed for wheezing.  1 Inhaler  3  . lisinopril (PRINIVIL,ZESTRIL) 10 MG tablet TAKE TWO TABLETS BY MOUTH DAILY  60 tablet  0  . sertraline (ZOLOFT) 100 MG tablet Take 1 tablet (100 mg total) by mouth daily.  30 tablet  0  . [DISCONTINUED] albuterol (PROVENTIL,VENTOLIN) 90 MCG/ACT inhaler Inhale 2 puffs into the lungs every 6 (six) hours as needed for wheezing.  17 g  0   No current facility-administered medications for this visit.    Review of Systems Review of Systems  Constitutional: Negative for fever, chills and unexpected weight change.  HENT: Negative for hearing loss, congestion, sore throat, trouble swallowing and voice change.   Eyes: Negative for visual disturbance.  Respiratory: Negative for cough and wheezing.   Cardiovascular: Negative for chest  pain, palpitations and leg swelling.  Gastrointestinal: Negative for nausea, vomiting, abdominal pain, diarrhea, constipation, blood in stool, abdominal distention, anal bleeding and rectal pain.  Genitourinary: Negative for hematuria and difficulty urinating.  Musculoskeletal: Negative for arthralgias.  Skin: Negative for rash and wound.  Neurological: Negative for seizures, syncope, weakness and headaches.  Hematological: Negative for adenopathy. Does not bruise/bleed easily.  Psychiatric/Behavioral: Negative for confusion.    Blood pressure 138/90, pulse 90, temperature 98.7 F (37.1 C), temperature source Oral, resp. rate 24, height 5\' 8"  (1.727 m), weight 179  lb (81.194 kg).  Physical Exam Physical Exam  Constitutional: He is oriented to person, place, and time. He appears well-developed and well-nourished. No distress.  His wife is with him.  HENT:  Head: Normocephalic.  Nose: Nose normal.  Mouth/Throat: No oropharyngeal exudate.  Eyes: Conjunctivae and EOM are normal. Pupils are equal, round, and reactive to light. Right eye exhibits no discharge. Left eye exhibits no discharge. No scleral icterus.  Neck: Normal range of motion. Neck supple. No JVD present. No tracheal deviation present. No thyromegaly present.  Cardiovascular: Normal rate, regular rhythm, normal heart sounds and intact distal pulses.   No murmur heard. Pulmonary/Chest: Effort normal and breath sounds normal. No stridor. No respiratory distress. He has no wheezes. He has no rales. He exhibits no tenderness.  Abdominal: Soft. Bowel sounds are normal. He exhibits no distension and no mass. There is no tenderness. There is no rebound and no guarding.  Grapefruit-sized umbilical hernia sac was superficial skin excoriation, but no cellulitis or purulence. This seemed to contain omentum. I can only partially reduce this. It does not seem tender. There are no scars on the abdomen. The liver edge seems palpable. No obvious fluid wave, but abdomen protuberant.  Musculoskeletal: Normal range of motion. He exhibits no edema and no tenderness.  Lymphadenopathy:    He has no cervical adenopathy.  Neurological: He is alert and oriented to person, place, and time. He has normal reflexes. Coordination normal.  Skin: Skin is warm and dry. No rash noted. He is not diaphoretic. No erythema. No pallor.  Psychiatric: He has a normal mood and affect. His behavior is normal. Judgment and thought content normal.    Data Reviewed Medical records from family [facility]. Recent lab work.  Assessment    Incarcerated umbilical hernia. Chronic. The operative overlying skin excoriation.  Alcohol  abuse  Tobacco abuse  Psychiatric disorder followed by psychiatrist,  Hypertension  Congestive heart failure     Plan    The patient is advised that he should have an operation to resect and repair the hernia. I told her that we we would be to resect all of the umbilical skin and the umbilicus and repair the hernia. We may have to simply do a primary repair due to risk of infection due to the excoriated skin. He is comfortable with that.  He is advised that he is at increased risk for wound complications because of his tobacco abuse. He is also causing his increased risk for complications and mental status changes postop because of his alcohol abuse. He was urged to stop smoking and drinking  He was advised to see his primary care physician for medical and cardiac clearance for general anesthesia  Scheduled for CT scan of abdomen and pelvis to assess hernia, Rule out ascites.  Check PT and PTT now  Return to see me after the CT scan and the medical clearance is performed.  Angelia Mould. Derrell Lolling, M.D., [facility] General and Minimally invasive Surgery Breast and Colorectal Surgery Office:   [phone] Pager:   [phone]  08/06/2012, 12:10 PM

## 2012-08-11 ENCOUNTER — Telehealth (INDEPENDENT_AMBULATORY_CARE_PROVIDER_SITE_OTHER): Payer: Self-pay

## 2012-08-11 NOTE — Telephone Encounter (Signed)
I left a message for the pt to call.  I need to give him an appointment to see Dr Derrell Lolling after his CT scan.  I see that it's rescheduled for 7/22.  I can't get him in until August.  I scheduled him for 8/13 at 3:45.  I await his call back to confirm.

## 2012-08-12 NOTE — Telephone Encounter (Signed)
Patient called back asking for morning appt, spoke to [facility] and appt changed to 8/14 11a.  Patient agreeable at this time.

## 2012-08-13 ENCOUNTER — Other Ambulatory Visit: Payer: BC Managed Care – PPO

## 2012-08-25 ENCOUNTER — Other Ambulatory Visit: Payer: BC Managed Care – PPO

## 2012-09-02 ENCOUNTER — Other Ambulatory Visit: Payer: BC Managed Care – PPO

## 2012-09-08 ENCOUNTER — Other Ambulatory Visit: Payer: Self-pay | Admitting: Family Medicine

## 2012-09-08 ENCOUNTER — Other Ambulatory Visit: Payer: BC Managed Care – PPO

## 2012-09-14 ENCOUNTER — Encounter (INDEPENDENT_AMBULATORY_CARE_PROVIDER_SITE_OTHER): Payer: Self-pay

## 2012-09-14 ENCOUNTER — Other Ambulatory Visit (INDEPENDENT_AMBULATORY_CARE_PROVIDER_SITE_OTHER): Payer: Self-pay | Admitting: General Surgery

## 2012-09-14 LAB — CREATININE, SERUM: Creat: 0.7 mg/dL (ref 0.50–1.35)

## 2012-09-14 LAB — BUN: BUN: 10 mg/dL (ref 6–23)

## 2012-09-15 ENCOUNTER — Ambulatory Visit
Admission: RE | Admit: 2012-09-15 | Discharge: 2012-09-15 | Disposition: A | Discharge status: Home / Self Care | Payer: BC Managed Care – PPO | Source: Ambulatory Visit | Attending: General Surgery | Admitting: General Surgery

## 2012-09-15 DIAGNOSIS — K420 Umbilical hernia with obstruction, without gangrene: Secondary | ICD-10-CM

## 2012-09-15 MED ORDER — IOHEXOL 300 MG/ML  SOLN
100.0000 mL | Freq: Once | INTRAMUSCULAR | Status: AC | PRN
  Administered 2012-09-15: 100 mL via INTRAVENOUS

## 2012-09-16 ENCOUNTER — Encounter (INDEPENDENT_AMBULATORY_CARE_PROVIDER_SITE_OTHER): Payer: BC Managed Care – PPO | Admitting: General Surgery

## 2012-09-17 ENCOUNTER — Encounter (INDEPENDENT_AMBULATORY_CARE_PROVIDER_SITE_OTHER): Payer: BC Managed Care – PPO | Admitting: General Surgery

## 2012-09-23 ENCOUNTER — Other Ambulatory Visit: Payer: Self-pay | Admitting: Family Medicine

## 2012-09-23 DIAGNOSIS — Z01818 Encounter for other preprocedural examination: Secondary | ICD-10-CM

## 2012-09-24 ENCOUNTER — Telehealth: Payer: Self-pay | Admitting: *Deleted

## 2012-09-24 NOTE — Telephone Encounter (Signed)
Dr Gwendolyn Grant please see letter sent to you from [facility] under letters,it was sent to [place] thru a letter option. Thank you. Jeromy Borcherding, Virgel Bouquet

## 2012-09-24 NOTE — Telephone Encounter (Signed)
Thank you. Juvia Aerts, Virgel Bouquet

## 2012-09-24 NOTE — Telephone Encounter (Signed)
Letter mailed. Prerna Harold, Virgel Bouquet

## 2012-09-25 NOTE — Telephone Encounter (Signed)
I spoke with Juan [name] about this.  He has many comorbidities (history of alcholism, heart failure) and needs to be cleared from cardiology standpoint.  I have already referred him urgently to cardiology for clearance.    I can't actually find the letter in my inbasket anymore, so I'm not sure who to reply to from [facility]?  Any thoughts on this? Thanks!

## 2012-10-06 ENCOUNTER — Other Ambulatory Visit: Payer: Self-pay | Admitting: Family Medicine

## 2012-10-06 ENCOUNTER — Telehealth (INDEPENDENT_AMBULATORY_CARE_PROVIDER_SITE_OTHER): Payer: Self-pay

## 2012-10-06 DIAGNOSIS — F419 Anxiety disorder, unspecified: Secondary | ICD-10-CM

## 2012-10-06 NOTE — Telephone Encounter (Signed)
No refill requested that I have seen by any pharmacy - but routing this to MD at this time. Wyatt Haste, RN-BSN

## 2012-10-06 NOTE — Telephone Encounter (Signed)
Left message for pt to call me.  I want to ask if he has been referred to a cardiologist.  If so, who and when did he see them.

## 2012-10-06 NOTE — Telephone Encounter (Signed)
The patients wife is calling because [facility] on [address] has sent refill requests since last Tuesday for the patients Klonopin but they have not heard back anything from the doctor.  I see no record of a electronic refill request.  The patient has been out for several days.  He is also out of his Carvedilol and Lisinopril, but she has not informed the pharmacy about those.

## 2012-10-06 NOTE — Telephone Encounter (Signed)
Please refill meds asap - thanks! Wyatt Haste, RN-BSN

## 2012-10-07 ENCOUNTER — Other Ambulatory Visit: Payer: Self-pay | Admitting: *Deleted

## 2012-10-07 DIAGNOSIS — F419 Anxiety disorder, unspecified: Secondary | ICD-10-CM

## 2012-10-07 MED ORDER — CLONAZEPAM 0.5 MG PO TABS: 0.5000 mg | ORAL_TABLET | Freq: Two times a day (BID) | ORAL | Status: DC | PRN

## 2012-10-07 MED ORDER — CARVEDILOL 3.125 MG PO TABS: 3.1250 mg | ORAL_TABLET | Freq: Two times a day (BID) | ORAL | Status: DC

## 2012-10-07 MED ORDER — LISINOPRIL 10 MG PO TABS: ORAL_TABLET | ORAL | Status: DC

## 2012-10-07 NOTE — Telephone Encounter (Signed)
Received message from [facility] pharmacy requesting refills for this patient on his Lisinopril and Klonopin. Forward to PCP for request.Juan [name], [name]

## 2012-10-07 NOTE — Telephone Encounter (Signed)
Done.  Klonopin placed in to be faxed box.

## 2012-10-08 ENCOUNTER — Encounter (INDEPENDENT_AMBULATORY_CARE_PROVIDER_SITE_OTHER): Payer: BC Managed Care – PPO | Admitting: General Surgery

## 2012-10-09 NOTE — Telephone Encounter (Signed)
Patient has been scheduled with [facility] on 10/14/2012@1 :30.Left several messages wanting to ask patient if he has informed [facility] about appointment,I haven't received any return calls.Bernardo Brayman, Virgel Bouquet

## 2012-10-12 NOTE — Telephone Encounter (Signed)
I am not able to close this encounter 

## 2012-10-20 ENCOUNTER — Telehealth (INDEPENDENT_AMBULATORY_CARE_PROVIDER_SITE_OTHER): Payer: Self-pay

## 2012-10-20 NOTE — Telephone Encounter (Signed)
Pt returning nurse's call and was given appointment date and time.

## 2012-10-20 NOTE — Telephone Encounter (Signed)
Message copied by Ivory Broad on Tue Oct 20, 2012  2:28 PM ------      Message from: [facility]      Created: Mon Oct 19, 2012  4:46 PM      Contact: [phone]       PLEASE CALL HIM HE NEEDS AN APPT AFTER 11-09-2012 SO HE CAN HAVE THE TEST RESULTS THAT HE NEEDS FOR THE DOC TO REVIEW ------

## 2012-10-20 NOTE — Telephone Encounter (Signed)
Left message for pt to call me.  He needs to reschedule appointment after his cardiac clearance.  I scheduled one for 10/17 at 2 pm.  I await his call to confirm he that is a good day for him.

## 2012-10-22 ENCOUNTER — Encounter (INDEPENDENT_AMBULATORY_CARE_PROVIDER_SITE_OTHER): Payer: BC Managed Care – PPO | Admitting: General Surgery

## 2012-11-08 ENCOUNTER — Inpatient Hospital Stay (HOSPITAL_COMMUNITY)
Admission: EM | Admit: 2012-11-08 | Discharge: 2012-11-13 | DRG: 177 | Disposition: A | Discharge status: Home / Self Care | Payer: No Typology Code available for payment source | Attending: Internal Medicine | Admitting: Internal Medicine

## 2012-11-08 ENCOUNTER — Emergency Department (HOSPITAL_COMMUNITY): Payer: No Typology Code available for payment source

## 2012-11-08 ENCOUNTER — Encounter (HOSPITAL_COMMUNITY): Payer: Self-pay | Admitting: *Deleted

## 2012-11-08 DIAGNOSIS — K420 Umbilical hernia with obstruction, without gangrene: Secondary | ICD-10-CM

## 2012-11-08 DIAGNOSIS — E871 Hypo-osmolality and hyponatremia: Secondary | ICD-10-CM | POA: Diagnosis present

## 2012-11-08 DIAGNOSIS — K861 Other chronic pancreatitis: Secondary | ICD-10-CM | POA: Diagnosis present

## 2012-11-08 DIAGNOSIS — I4510 Unspecified right bundle-branch block: Secondary | ICD-10-CM

## 2012-11-08 DIAGNOSIS — J441 Chronic obstructive pulmonary disease with (acute) exacerbation: Secondary | ICD-10-CM | POA: Diagnosis present

## 2012-11-08 DIAGNOSIS — F411 Generalized anxiety disorder: Secondary | ICD-10-CM | POA: Diagnosis present

## 2012-11-08 DIAGNOSIS — E785 Hyperlipidemia, unspecified: Secondary | ICD-10-CM | POA: Diagnosis present

## 2012-11-08 DIAGNOSIS — I509 Heart failure, unspecified: Secondary | ICD-10-CM | POA: Diagnosis present

## 2012-11-08 DIAGNOSIS — I10 Essential (primary) hypertension: Secondary | ICD-10-CM | POA: Diagnosis present

## 2012-11-08 DIAGNOSIS — J309 Allergic rhinitis, unspecified: Secondary | ICD-10-CM

## 2012-11-08 DIAGNOSIS — J690 Pneumonitis due to inhalation of food and vomit: Principal | ICD-10-CM | POA: Diagnosis present

## 2012-11-08 DIAGNOSIS — F32A Depression, unspecified: Secondary | ICD-10-CM

## 2012-11-08 DIAGNOSIS — F10239 Alcohol dependence with withdrawal, unspecified: Secondary | ICD-10-CM | POA: Diagnosis present

## 2012-11-08 DIAGNOSIS — R9431 Abnormal electrocardiogram [ECG] [EKG]: Secondary | ICD-10-CM | POA: Diagnosis present

## 2012-11-08 DIAGNOSIS — F419 Anxiety disorder, unspecified: Secondary | ICD-10-CM

## 2012-11-08 DIAGNOSIS — K429 Umbilical hernia without obstruction or gangrene: Secondary | ICD-10-CM | POA: Diagnosis present

## 2012-11-08 DIAGNOSIS — I452 Bifascicular block: Secondary | ICD-10-CM | POA: Diagnosis present

## 2012-11-08 DIAGNOSIS — I5040 Unspecified combined systolic (congestive) and diastolic (congestive) heart failure: Secondary | ICD-10-CM

## 2012-11-08 DIAGNOSIS — I5023 Acute on chronic systolic (congestive) heart failure: Secondary | ICD-10-CM | POA: Diagnosis present

## 2012-11-08 DIAGNOSIS — I429 Cardiomyopathy, unspecified: Secondary | ICD-10-CM

## 2012-11-08 DIAGNOSIS — F1010 Alcohol abuse, uncomplicated: Secondary | ICD-10-CM

## 2012-11-08 DIAGNOSIS — R197 Diarrhea, unspecified: Secondary | ICD-10-CM | POA: Diagnosis present

## 2012-11-08 DIAGNOSIS — I4729 Other ventricular tachycardia: Secondary | ICD-10-CM | POA: Diagnosis present

## 2012-11-08 DIAGNOSIS — D696 Thrombocytopenia, unspecified: Secondary | ICD-10-CM | POA: Diagnosis present

## 2012-11-08 DIAGNOSIS — F1020 Alcohol dependence, uncomplicated: Secondary | ICD-10-CM | POA: Diagnosis present

## 2012-11-08 DIAGNOSIS — F3289 Other specified depressive episodes: Secondary | ICD-10-CM | POA: Diagnosis present

## 2012-11-08 DIAGNOSIS — I428 Other cardiomyopathies: Secondary | ICD-10-CM | POA: Diagnosis present

## 2012-11-08 DIAGNOSIS — D649 Anemia, unspecified: Secondary | ICD-10-CM | POA: Diagnosis present

## 2012-11-08 DIAGNOSIS — I472 Ventricular tachycardia, unspecified: Secondary | ICD-10-CM | POA: Diagnosis present

## 2012-11-08 DIAGNOSIS — R569 Unspecified convulsions: Secondary | ICD-10-CM | POA: Diagnosis present

## 2012-11-08 DIAGNOSIS — F17200 Nicotine dependence, unspecified, uncomplicated: Secondary | ICD-10-CM | POA: Diagnosis present

## 2012-11-08 DIAGNOSIS — G9340 Encephalopathy, unspecified: Secondary | ICD-10-CM | POA: Diagnosis present

## 2012-11-08 DIAGNOSIS — R251 Tremor, unspecified: Secondary | ICD-10-CM

## 2012-11-08 DIAGNOSIS — E876 Hypokalemia: Secondary | ICD-10-CM | POA: Diagnosis present

## 2012-11-08 DIAGNOSIS — F10939 Alcohol use, unspecified with withdrawal, unspecified: Secondary | ICD-10-CM | POA: Diagnosis present

## 2012-11-08 DIAGNOSIS — R160 Hepatomegaly, not elsewhere classified: Secondary | ICD-10-CM

## 2012-11-08 DIAGNOSIS — F329 Major depressive disorder, single episode, unspecified: Secondary | ICD-10-CM | POA: Diagnosis present

## 2012-11-08 LAB — URINALYSIS, ROUTINE W REFLEX MICROSCOPIC
Bilirubin Urine: NEGATIVE
Ketones, ur: NEGATIVE mg/dL
Leukocytes, UA: NEGATIVE
Nitrite: NEGATIVE
Protein, ur: 30 mg/dL — AB
Specific Gravity, Urine: 1.018 (ref 1.005–1.030)
Urobilinogen, UA: 1 mg/dL (ref 0.0–1.0)
pH: 6 (ref 5.0–8.0)

## 2012-11-08 LAB — CBC WITH DIFFERENTIAL/PLATELET
Basophils Relative: 1 % (ref 0–1)
Eosinophils Absolute: 0.2 10*3/uL (ref 0.0–0.7)
Eosinophils Relative: 2 % (ref 0–5)
MCH: 32.5 pg (ref 26.0–34.0)
MCHC: 36.1 g/dL — ABNORMAL HIGH (ref 30.0–36.0)
Neutrophils Relative %: 80 % — ABNORMAL HIGH (ref 43–77)
Platelets: 204 10*3/uL (ref 150–400)
RBC: 4.55 MIL/uL (ref 4.22–5.81)
RDW: 12.4 % (ref 11.5–15.5)

## 2012-11-08 LAB — POCT I-STAT, CHEM 8
Calcium, Ion: 1.1 mmol/L — ABNORMAL LOW (ref 1.13–1.30)
Glucose, Bld: 191 mg/dL — ABNORMAL HIGH (ref 70–99)
HCT: 48 % (ref 39.0–52.0)
Hemoglobin: 16.3 g/dL (ref 13.0–17.0)
Potassium: 3.3 mEq/L — ABNORMAL LOW (ref 3.5–5.1)
TCO2: 17 mmol/L (ref 0–100)

## 2012-11-08 LAB — CREATININE, SERUM
Creatinine, Ser: 0.62 mg/dL (ref 0.50–1.35)
GFR calc non Af Amer: >90 mL/min (ref >90)

## 2012-11-08 LAB — CG4 I-STAT (LACTIC ACID): Lactic Acid, Venous: 1.72 mmol/L (ref 0.5–2.2)

## 2012-11-08 LAB — GLUCOSE, CAPILLARY: Glucose-Capillary: 180 mg/dL — ABNORMAL HIGH (ref 70–99)

## 2012-11-08 LAB — URINE MICROSCOPIC-ADD ON

## 2012-11-08 LAB — RAPID URINE DRUG SCREEN, HOSP PERFORMED: Opiates: NOT DETECTED

## 2012-11-08 LAB — PROTIME-INR: Prothrombin Time: 12.7 seconds (ref 11.6–15.2)

## 2012-11-08 MED ORDER — THIAMINE HCL 100 MG/ML IJ SOLN
100.0000 mg | Freq: Every day | INTRAMUSCULAR | Status: DC
  Administered 2012-11-09: 100 mg via INTRAVENOUS
  Filled 2012-11-08 (×5): qty 1

## 2012-11-08 MED ORDER — PIPERACILLIN-TAZOBACTAM 3.375 G IVPB 30 MIN: 3.3750 g | Freq: Once | INTRAVENOUS | Status: DC

## 2012-11-08 MED ORDER — LORAZEPAM 2 MG/ML IJ SOLN
0.0000 mg | Freq: Four times a day (QID) | INTRAMUSCULAR | Status: AC
  Administered 2012-11-09: 2 mg via INTRAVENOUS
  Filled 2012-11-08: qty 1

## 2012-11-08 MED ORDER — SODIUM CHLORIDE 0.9 % IV BOLUS (SEPSIS)
2000.0000 mL | Freq: Once | INTRAVENOUS | Status: AC
  Administered 2012-11-08: 2000 mL via INTRAVENOUS

## 2012-11-08 MED ORDER — PANTOPRAZOLE SODIUM 40 MG PO TBEC
40.0000 mg | DELAYED_RELEASE_TABLET | Freq: Every day | ORAL | Status: DC
  Administered 2012-11-09 – 2012-11-13 (×5): 40 mg via ORAL
  Filled 2012-11-08 (×6): qty 1

## 2012-11-08 MED ORDER — TIOTROPIUM BROMIDE MONOHYDRATE 18 MCG IN CAPS
18.0000 ug | ORAL_CAPSULE | Freq: Every day | RESPIRATORY_TRACT | Status: DC
  Administered 2012-11-09: 18 ug via RESPIRATORY_TRACT
  Filled 2012-11-08: qty 5

## 2012-11-08 MED ORDER — VENLAFAXINE HCL ER 150 MG PO CP24
150.0000 mg | ORAL_CAPSULE | Freq: Every day | ORAL | Status: DC
  Administered 2012-11-08 – 2012-11-13 (×6): 150 mg via ORAL
  Filled 2012-11-08 (×6): qty 1

## 2012-11-08 MED ORDER — VANCOMYCIN HCL IN DEXTROSE 1-5 GM/200ML-% IV SOLN
1000.0000 mg | Freq: Once | INTRAVENOUS | Status: AC
  Administered 2012-11-08: 1000 mg via INTRAVENOUS
  Filled 2012-11-08: qty 200

## 2012-11-08 MED ORDER — LEVALBUTEROL HCL 0.63 MG/3ML IN NEBU: 0.6300 mg | INHALATION_SOLUTION | RESPIRATORY_TRACT | Status: DC | PRN

## 2012-11-08 MED ORDER — LISINOPRIL 20 MG PO TABS
20.0000 mg | ORAL_TABLET | Freq: Every day | ORAL | Status: DC
  Administered 2012-11-08 – 2012-11-13 (×6): 20 mg via ORAL
  Filled 2012-11-08 (×6): qty 1

## 2012-11-08 MED ORDER — ENOXAPARIN SODIUM 40 MG/0.4ML ~~LOC~~ SOLN
40.0000 mg | SUBCUTANEOUS | Status: DC
  Administered 2012-11-08 – 2012-11-11 (×4): 40 mg via SUBCUTANEOUS
  Filled 2012-11-08 (×5): qty 0.4

## 2012-11-08 MED ORDER — SODIUM CHLORIDE 0.9 % IV SOLN
1.0000 g | Freq: Once | INTRAVENOUS | Status: AC
  Administered 2012-11-08: 1 g via INTRAVENOUS
  Filled 2012-11-08: qty 10

## 2012-11-08 MED ORDER — LORAZEPAM 1 MG PO TABS
0.0000 mg | ORAL_TABLET | Freq: Four times a day (QID) | ORAL | Status: AC
  Administered 2012-11-08 – 2012-11-10 (×3): 1 mg via ORAL
  Filled 2012-11-08 (×2): qty 1
  Filled 2012-11-08: qty 2

## 2012-11-08 MED ORDER — LORATADINE 10 MG PO TABS
10.0000 mg | ORAL_TABLET | Freq: Every day | ORAL | Status: DC
  Administered 2012-11-08 – 2012-11-13 (×6): 10 mg via ORAL
  Filled 2012-11-08 (×6): qty 1

## 2012-11-08 MED ORDER — CLONAZEPAM 0.5 MG PO TABS
0.5000 mg | ORAL_TABLET | Freq: Two times a day (BID) | ORAL | Status: DC
  Administered 2012-11-08 – 2012-11-12 (×8): 0.5 mg via ORAL
  Filled 2012-11-08 (×8): qty 1

## 2012-11-08 MED ORDER — CARVEDILOL 3.125 MG PO TABS
3.1250 mg | ORAL_TABLET | Freq: Two times a day (BID) | ORAL | Status: DC
  Administered 2012-11-09 – 2012-11-12 (×7): 3.125 mg via ORAL
  Filled 2012-11-08 (×9): qty 1

## 2012-11-08 MED ORDER — VITAMIN B-1 100 MG PO TABS
100.0000 mg | ORAL_TABLET | Freq: Every day | ORAL | Status: DC
  Administered 2012-11-08 – 2012-11-13 (×5): 100 mg via ORAL
  Filled 2012-11-08 (×6): qty 1

## 2012-11-08 MED ORDER — LORAZEPAM 2 MG/ML IJ SOLN
2.0000 mg | Freq: Once | INTRAMUSCULAR | Status: AC
  Administered 2012-11-08: 2 mg via INTRAVENOUS
  Filled 2012-11-08: qty 1

## 2012-11-08 MED ORDER — SODIUM CHLORIDE 0.9 % IV SOLN
INTRAVENOUS | Status: DC
  Administered 2012-11-08 – 2012-11-09 (×2): via INTRAVENOUS

## 2012-11-08 MED ORDER — PIPERACILLIN-TAZOBACTAM 3.375 G IVPB
3.3750 g | Freq: Three times a day (TID) | INTRAVENOUS | Status: DC
  Administered 2012-11-09 – 2012-11-13 (×13): 3.375 g via INTRAVENOUS
  Filled 2012-11-08 (×18): qty 50

## 2012-11-08 MED ORDER — LORAZEPAM 2 MG/ML IJ SOLN
0.0000 mg | Freq: Two times a day (BID) | INTRAMUSCULAR | Status: AC
  Administered 2012-11-11 – 2012-11-12 (×2): 1 mg via INTRAVENOUS
  Filled 2012-11-08 (×2): qty 1

## 2012-11-08 MED ORDER — POTASSIUM CHLORIDE CRYS ER 20 MEQ PO TBCR
20.0000 meq | EXTENDED_RELEASE_TABLET | Freq: Once | ORAL | Status: AC
  Administered 2012-11-08: 20 meq via ORAL
  Filled 2012-11-08: qty 1

## 2012-11-08 MED ORDER — LORAZEPAM 1 MG PO TABS
0.0000 mg | ORAL_TABLET | Freq: Two times a day (BID) | ORAL | Status: AC
Start: 2012-11-10 — End: 2012-11-12
  Administered 2012-11-12: 2 mg via ORAL
  Filled 2012-11-08: qty 2

## 2012-11-08 MED ORDER — SODIUM CHLORIDE 0.9 % IV BOLUS (SEPSIS)
1000.0000 mL | Freq: Once | INTRAVENOUS | Status: AC
  Administered 2012-11-08: 1000 mL via INTRAVENOUS

## 2012-11-08 MED ORDER — PIPERACILLIN-TAZOBACTAM 3.375 G IVPB 30 MIN
3.3750 g | Freq: Once | INTRAVENOUS | Status: DC
  Administered 2012-11-08: 3.375 g via INTRAVENOUS
  Filled 2012-11-08: qty 50

## 2012-11-08 NOTE — ED Notes (Signed)
Dr. Adela Glimpse with patient at this time for assessment.

## 2012-11-08 NOTE — ED Notes (Signed)
Patient and wife concerned about additional doses of Ativan for the patient b/c pt feels dizzy and warm all over. PA to bedside to reassess and discuss plan, pt/wife agreed to administration of IV ativan. NAD, will continue to monitor

## 2012-11-08 NOTE — ED Provider Notes (Signed)
CSN: [id number]     Arrival date & time 11/08/12  1536 History   First MD Initiated Contact with Patient 11/08/12 1604     Chief Complaint  Patient presents with  . Seizures   (Consider location/radiation/quality/duration/timing/severity/associated sxs/prior Treatment) HPI Comments: Patient is a 62 year old male with history of congestive heart failure, alcoholism, seasonal allergies, anxiety, depression, asthma, hyperlipidemia, hypertension who presents today after having a witnessed seizure. His wife witnessed the seizure and reports that it lasted approximately 45 seconds. She notes that initially he began to have some garbled speech. She knew it was going to be his arms that moved and so she held his arms. He was post ictal after the seizure when EMS arrived. There was no bowel or bladder incontinence. He does have prior history of seizure 2 years ago. It is unclear if this was an organic seizure or due to alcohol withdrawal at that time. The patient at this time has been drinking bourbon nonstop for the past 4 days. He ran out of Bourbon it yesterday afternoon and has not drank since that time. His wife reports over the past week the patient has told her that he has felt very hot, which is helped him he was a normal temperature. He is also been having worsening urinary and bowel urgency. He has not made it to the bathroom several times this week. Reports that he has an increased stress level due to the current news. He is very worried about Ebola.  The history is provided by the patient and the spouse. No language interpreter was used.    Past Medical History  Diagnosis Date  . CHF (congestive heart failure) 10/2010    ECHO:  EF 40%, Grade II diastolic dysfunction  . Alcoholism   . Allergy     seasonal   . Anxiety   . Depression   . Asthma   . Hyperlipidemia   . Hypertension   . Seizures    Past Surgical History  Procedure Laterality Date  . Removal of ingrown toenail     Family  History  Problem Relation Age of Onset  . Alzheimer's disease Mother   . Diabetes Mother    History  Substance Use Topics  . Smoking status: Current Every Day Smoker -- 1.00 packs/day for 40 years    Types: Cigarettes  . Smokeless tobacco: Not on file  . Alcohol Use: Yes    Review of Systems  Constitutional: Positive for fever (subjective).  Respiratory: Positive for shortness of breath.   Gastrointestinal: Negative for nausea and vomiting.  Genitourinary: Positive for urgency.  Neurological: Positive for seizures.  All other systems reviewed and are negative.    Allergies  Review of patient's allergies indicates no known allergies.  Home Medications   Current Outpatient Rx  Name  Route  Sig  Dispense  Refill  . carvedilol (COREG) 3.125 MG tablet   Oral   Take 1 tablet (3.125 mg total) by mouth 2 (two) times daily with a meal.   90 tablet   1   . clonazePAM (KLONOPIN) 0.5 MG tablet   Oral   Take 1 tablet (0.5 mg total) by mouth 2 (two) times daily as needed for anxiety.   60 tablet   1   . desloratadine (CLARINEX) 5 MG tablet   Oral   Take 1 tablet (5 mg total) by mouth daily.   30 tablet   2   . fluticasone (FLONASE) 50 MCG/ACT nasal spray   Nasal  Place 2 sprays into the nose daily.         Marland Kitchen ipratropium (ATROVENT HFA) 17 MCG/ACT inhaler   Inhalation   Inhale 2 puffs into the lungs 3 (three) times daily as needed for wheezing.   1 Inhaler   3   . lisinopril (PRINIVIL,ZESTRIL) 10 MG tablet   Oral   Take 20 mg by mouth daily.         Marland Kitchen venlafaxine XR (EFFEXOR-XR) 150 MG 24 hr capsule   Oral   Take 150 mg by mouth daily.          BP 142/75  Pulse 114  Temp(Src) 97.8 F (36.6 C) (Oral)  Resp 26  Ht 5\' 8"  (1.727 m)  Wt 180 lb (81.647 kg)  BMI 27.38 kg/m2  SpO2 94% Physical Exam  Nursing note and vitals reviewed. Constitutional: He appears well-developed and well-nourished. No distress.  HENT:  Head: Normocephalic and atraumatic.   Right Ear: External ear normal.  Left Ear: External ear normal.  Nose: Nose normal.  Eyes: Conjunctivae are normal.  Neck: Normal range of motion. No tracheal deviation present.  Cardiovascular: Normal rate, regular rhythm and normal heart sounds.   Pulmonary/Chest: Effort normal and breath sounds normal. No stridor.  Abdominal: Soft. He exhibits no distension. There is no tenderness.  Musculoskeletal: Normal range of motion.  Neurological: He is alert. He has normal strength.  Intention tremor Oriented to person and place, not to time  Skin: Skin is warm and dry. He is not diaphoretic.  Psychiatric: He has a normal mood and affect. His behavior is normal.    ED Course  Procedures (including critical care time) Labs Review Labs Reviewed  GLUCOSE, CAPILLARY - Abnormal; Notable for the following:    Glucose-Capillary 180 (*)    All other components within normal limits  URINALYSIS, ROUTINE W REFLEX MICROSCOPIC - Abnormal; Notable for the following:    Hgb urine dipstick TRACE (*)    Protein, ur 30 (*)    All other components within normal limits  CBC WITH DIFFERENTIAL - Abnormal; Notable for the following:    MCHC 36.1 (*)    Neutrophils Relative % 80 (*)    Lymphocytes Relative 10 (*)    All other components within normal limits  POCT I-STAT, CHEM 8 - Abnormal; Notable for the following:    Sodium 128 (*)    Potassium 3.3 (*)    Chloride 91 (*)    Glucose, Bld 191 (*)    Calcium, Ion 1.10 (*)    All other components within normal limits  URINE MICROSCOPIC-ADD ON  URINE RAPID DRUG SCREEN (HOSP PERFORMED)  CG4 I-STAT (LACTIC ACID)   Imaging Review Dg Chest 2 View  11/08/2012   CLINICAL DATA:  Shortness of breath  EXAM: CHEST  2 VIEW  COMPARISON:  10/25/2010  FINDINGS: The cardiac shadow is within normal limits. The lungs are well aerated bilaterally. Some mild increased density is noted in the right upper lobe consistent with an acute infiltrate. No other focal abnormality  is noted.  IMPRESSION: Right upper lobe infiltrate.   Electronically Signed   By: Alcide Clever M.D.   On: 11/08/2012 16:35    MDM  No diagnosis found.  Patient presents to the emergency department after what was likely an alcohol withdrawal seizure. Patient reexamined and became oriented to time. He would like detox from EtOH. He was put on CIWA protocol, but remained tremulous and tachycardic in the ED. Also found to have  a likely aspiration PNA. Given vancomycin and zosyn. He will be admitted to medicine for a medical detox as I do not feel this patient is safe to be medically cleared for psych or attempt detox at home. Dr. Fonnie Jarvis evaluated patient and agrees with plan. Vital signs stable at this time. Patient / Family / Caregiver informed of clinical course, understand medical decision-making process, and agree with plan.     Mora Bellman, PA-C 11/08/12 2116

## 2012-11-08 NOTE — ED Notes (Signed)
Per ems, pt had a witnessed seizure that lasted approx 30 sec. Pt also having wheezing/ congestion and chest tightness.  Breathing treatment in progress on arrival.

## 2012-11-08 NOTE — ED Notes (Signed)
Patient was given a urinal .  

## 2012-11-08 NOTE — ED Notes (Signed)
Dr. Fonnie Jarvis in with patient at this time

## 2012-11-08 NOTE — ED Notes (Signed)
Pt reports last use of alcohol was this morning Pt reports drinking 4 bourbon and cokes/day, for about 15 years.

## 2012-11-08 NOTE — ED Notes (Signed)
Bed: WA04 Expected date:  Expected time:  Means of arrival:  Comments: seizure 

## 2012-11-08 NOTE — H&P (Signed)
PCP:   Renold Don, MD patient's family wishes for him to go to a different doctor from now on.   Chief Complaint:  seizure  HPI: Juan [name] is a 62 y.o. male   has a past medical history of CHF (congestive heart failure) (10/2010); Alcoholism; Allergy; Anxiety; Depression; Asthma; Hyperlipidemia; Hypertension; and Seizures.   Presented with  Patient is a heavy drinker for the past 15 years he has been drinking 4-5 drinks a day. His last drink was yesterday Today at 3 pm he was sitting in chair and wife noted a seizure like activity. Wife noted rhythmic jerking bilaterally for about 30 seconds. He was unresponsive and post ictal afterwards for about 30 min. Did not bite his tongue, no bladder or bowel incontinence. No history of head injury recently. 2 years ago he had a similar episode after abstaining from alcohol and stopping his klonopin suddenly. This time wife thinks he has been taking his medications. Of note his Zoloft has recently been changed to Effexor.  Denies any recent fever but may had a  chill, he has smoker's cough and allergies so he has frequent cough.  He has had some vomiting yesterday.  Wife thinks he may be interested in quitting drinking. Wife is concerned that he does not follow up with doctors as he should. She states he has a lot of anxiety and panic issues at home.  CXR in ER showed infiltrare worrisome for aspiration PNA. He received first dose of vanc and Zosyn and hospitalist was called for admittion. patient was started on CIWA protocol in ER.  Wife is also reporting occasional episodes of stool incontinence that has been going on for some time. Patient has a history of periumbilical hernia he was supposed to be cleared for surgery for this by cardiology by so far has not followed up.   Review of Systems:    Pertinent positives include:  chills, fatigue, nausea, vomiting, cough, seizure  Constitutional:  No weight loss, night sweats, Fevers weight loss   HEENT:  No headaches, Difficulty swallowing,Tooth/dental problems,Sore throat,  No sneezing, itching, ear ache, nasal congestion, post nasal drip,  Cardio-vascular:  No chest pain, Orthopnea, PND, anasarca, dizziness, palpitations.no Bilateral lower extremity swelling  GI:  No heartburn, indigestion, abdominal pain,  diarrhea, change in bowel habits, loss of appetite, melena, blood in stool, hematemesis Resp:  no shortness of breath at rest. No dyspnea on exertion, No excess mucus, no productive cough, No non-productive  No coughing up of blood.No change in color of mucus.No wheezing. Skin:  no rash or lesions. No jaundice GU:  no dysuria, change in color of urine, no urgency or frequency. No straining to urinate.  No flank pain.  Musculoskeletal:  No joint pain or no joint swelling. No decreased range of motion. No back pain.  Psych:  No change in mood or affect. No depression or anxiety. No memory loss.  Neuro: no localizing neurological complaints, no tingling, no weakness, no double vision, no gait abnormality, no slurred speech, no confusion  Otherwise ROS are negative except for above, 10 systems were reviewed  Past Medical History: Past Medical History  Diagnosis Date  . CHF (congestive heart failure) 10/2010    ECHO:  EF 40%, Grade II diastolic dysfunction  . Alcoholism   . Allergy     seasonal   . Anxiety   . Depression   . Asthma   . Hyperlipidemia   . Hypertension   . Seizures    Past Surgical  History  Procedure Laterality Date  . Removal of ingrown toenail       Medications: Prior to Admission medications   Medication Sig Start Date End Date Taking? Authorizing Provider  carvedilol (COREG) 3.125 MG tablet Take 1 tablet (3.125 mg total) by mouth 2 (two) times daily with a meal. 10/06/12  Yes Tobey Grim, MD  clonazePAM (KLONOPIN) 0.5 MG tablet Take 1 tablet (0.5 mg total) by mouth 2 (two) times daily as needed for anxiety. 10/06/12  Yes Tobey Grim, MD   desloratadine (CLARINEX) 5 MG tablet Take 1 tablet (5 mg total) by mouth daily. 07/20/12 07/20/13 Yes Tobey Grim, MD  fluticasone (FLONASE) 50 MCG/ACT nasal spray Place 2 sprays into the nose daily.   Yes Historical Provider, MD  ipratropium (ATROVENT HFA) 17 MCG/ACT inhaler Inhale 2 puffs into the lungs 3 (three) times daily as needed for wheezing. 07/20/12  Yes Tobey Grim, MD  lisinopril (PRINIVIL,ZESTRIL) 10 MG tablet Take 20 mg by mouth daily.   Yes Historical Provider, MD  venlafaxine XR (EFFEXOR-XR) 150 MG 24 hr capsule Take 150 mg by mouth daily.   Yes Historical Provider, MD    Allergies:  No Known Allergies  Social History:  Ambulatory   Lives at   Home with wife   reports that he has been smoking Cigarettes.  He has a 40 pack-year smoking history. He does not have any smokeless tobacco history on file. He reports that  drinks alcohol. He reports that he does not use illicit drugs.   Family History: family history includes Alzheimer's disease in his mother; Diabetes in his mother; Heart disease in his maternal uncle.    Physical Exam: Patient Vitals for the past 24 hrs:  BP Temp Temp src Pulse Resp SpO2 Height Weight  11/08/12 1945 140/65 mmHg - - 126 23 97 % - -  11/08/12 1900 163/83 mmHg - - 130 28 98 % - -  11/08/12 1815 141/72 mmHg - - 119 17 99 % - -  11/08/12 1730 156/78 mmHg - - 124 24 96 % - -  11/08/12 1652 138/67 mmHg - - 119 - - - -  11/08/12 1645 138/67 mmHg - - 116 24 95 % - -  11/08/12 1550 142/75 mmHg 97.8 F (36.6 C) Oral 114 26 94 % 5\' 8"  (1.727 m) 81.647 kg (180 lb)    1. General:  in No Acute distress 2. Psychological:somnolent Oriented 3. Head/ENT:   Moist   Mucous Membranes                          Head Non traumatic, neck supple                          Normal   Dentition 4. SKIN: normal Skin turgor,  Skin clean Dry and intact no rash except for periumbilical hernia that has area of scaly skin and scabbing 5. Heart: Rapid but Regular  rate and rhythm no Murmur, Rub or gallop 6. Lungs: Clear to auscultation bilaterally, no wheezes or crackles  somewhat distant breath sounds 7. Abdomen: Soft, non-tender, Non distended. Large Umbilical hernia noted measuring about 12 x 11 cm nontender with some superficial skin irritation. 8. Lower extremities: no clubbing, cyanosis, or edema 9. Neurologically tremulous,  strength intact in upper and lower extremities patient is somnolent but responds to commands 10. MSK: Normal range of motion  body mass index  is 27.38 kg/(m^2).   Labs on Admission:   Recent Labs  11/08/12 1638  NA 128*  K 3.3*  CL 91*  GLUCOSE 191*  BUN 7  CREATININE 1.00   No results found for this basename: AST, ALT, ALKPHOS, BILITOT, PROT, ALBUMIN,  in the last 72 hours No results found for this basename: LIPASE, AMYLASE,  in the last 72 hours  Recent Labs  11/08/12 1630 11/08/12 1638  WBC 8.7  --   NEUTROABS 7.0  --   HGB 14.8 16.3  HCT 41.0 48.0  MCV 90.1  --   PLT 204  --    No results found for this basename: CKTOTAL, CKMB, CKMBINDEX, TROPONINI,  in the last 72 hours No results found for this basename: TSH, T4TOTAL, FREET3, T3FREE, THYROIDAB,  in the last 72 hours No results found for this basename: VITAMINB12, FOLATE, FERRITIN, TIBC, IRON, RETICCTPCT,  in the last 72 hours No results found for this basename: HGBA1C    Estimated Creatinine Clearance: 76 ml/min (by C-G formula based on Cr of 1). ABG    Component Value Date/Time   TCO2 17 11/08/2012 1638     No results found for this basename: DDIMER     Other results:  I have pearsonaly reviewed this: ECG REPORT  Rate: 113  Rhythm: sinus tachycardia right bundle branch block ST&T Change: ST segment depression in leads V4 and V5, new from prior  UA No evidence of UTI   Cultures: No results found for this basename: sdes, specrequest, cult, reptstatus       Radiological Exams on Admission: Dg Chest 2 View  11/08/2012    CLINICAL DATA:  Shortness of breath  EXAM: CHEST  2 VIEW  COMPARISON:  10/25/2010  FINDINGS: The cardiac shadow is within normal limits. The lungs are well aerated bilaterally. Some mild increased density is noted in the right upper lobe consistent with an acute infiltrate. No other focal abnormality is noted.  IMPRESSION: Right upper lobe infiltrate.   Electronically Signed   By: Alcide Clever M.D.   On: 11/08/2012 16:35    Chart has been reviewed  Assessment/Plan  62 year old gentleman with history of alcohol withdrawal-induced seizures in the past presents with another episode also with noted to have an infiltrate on chest x-ray consistent with aspiration pneumonia.  Present on Admission:  . Alcoholism - we'll admit to step down expecting more withdrawal symptoms. Continue CIWA protocol, thiamine daily, once patient is more awake can have a discussion about rehabilitation. Rectal social work consult to help with obtaining resources  . HTN (hypertension) - continue home medications  Seizure due to alcohol withdrawal  -  Continue seizure precautions we'll obtain CT scan of the head . Hyponatremia - likely secondary to alcohol abuse, we'll check liver function. Obtain urine electrolytes give IV fluids  . Aspiration pneumonia - treated broadly with Zosyn and vancomycin  . Hypocalcemia -will replace  . Abnormal ECG - No history of coronary artery disease per family likely rate related will cycle cardiac enzymes and repeat EKG   Prophylaxis: Lovenox, Protonix  CODE STATUS: FULL CODE  Other plan as per orders.  I have spent a total of 65 min on this admission  Eri Platten 11/08/2012, 8:18 PM

## 2012-11-08 NOTE — ED Provider Notes (Signed)
Medical screening examination/treatment/procedure(s) were conducted as a shared visit with non-physician practitioner(s) and myself.  I personally evaluated the patient during the encounter  This 62 year old male is an alcoholic who wants to stop drinking alcohol he wants detox he is not a threat to himself or others he denies suicidal or homicidal ideation he denies hallucinations, in the emergency department he continues to have generalized tremors shakes and anxiety and tachycardia to 130 and has a possible aspiration pneumonia and his chest x-ray, he does not appears stabilized for psychiatric admission so medicine admission has been requested.  Hurman Horn, MD 11/12/12 2037

## 2012-11-09 ENCOUNTER — Ambulatory Visit: Payer: BC Managed Care – PPO | Admitting: Interventional Cardiology

## 2012-11-09 ENCOUNTER — Inpatient Hospital Stay (HOSPITAL_COMMUNITY)
Admit: 2012-11-09 | Discharge: 2012-11-09 | Disposition: A | Discharge status: Home / Self Care | Payer: No Typology Code available for payment source | Attending: Internal Medicine | Admitting: Internal Medicine

## 2012-11-09 DIAGNOSIS — F1020 Alcohol dependence, uncomplicated: Secondary | ICD-10-CM | POA: Diagnosis present

## 2012-11-09 DIAGNOSIS — F10939 Alcohol use, unspecified with withdrawal, unspecified: Secondary | ICD-10-CM

## 2012-11-09 DIAGNOSIS — I4510 Unspecified right bundle-branch block: Secondary | ICD-10-CM

## 2012-11-09 DIAGNOSIS — F10239 Alcohol dependence with withdrawal, unspecified: Secondary | ICD-10-CM

## 2012-11-09 HISTORY — DX: Unspecified right bundle-branch block: I45.10

## 2012-11-09 LAB — BLOOD GAS, ARTERIAL
Acid-base deficit: 4.4 mmol/L — ABNORMAL HIGH (ref 0.0–2.0)
Drawn by: 11249
O2 Content: 2 L/min
TCO2: 18 mmol/L (ref 0–100)
pCO2 arterial: 35.9 mmHg (ref 35.0–45.0)
pO2, Arterial: 78.5 mmHg — ABNORMAL LOW (ref 80.0–100.0)

## 2012-11-09 LAB — MRSA PCR SCREENING: MRSA by PCR: POSITIVE — AB

## 2012-11-09 LAB — TSH: TSH: 1.045 u[IU]/mL (ref 0.350–4.500)

## 2012-11-09 LAB — BASIC METABOLIC PANEL
CO2: 22 mEq/L (ref 19–32)
Calcium: 8.4 mg/dL (ref 8.4–10.5)
GFR calc Af Amer: >90 mL/min (ref >90)
Glucose, Bld: 100 mg/dL — ABNORMAL HIGH (ref 70–99)
Potassium: 3.1 mEq/L — ABNORMAL LOW (ref 3.5–5.1)
Sodium: 129 mEq/L — ABNORMAL LOW (ref 135–145)

## 2012-11-09 LAB — SODIUM, URINE, RANDOM: Sodium, Ur: 103 mEq/L

## 2012-11-09 LAB — AMMONIA: Ammonia: 36 umol/L (ref 11–60)

## 2012-11-09 LAB — MAGNESIUM: Magnesium: 1.5 mg/dL (ref 1.5–2.5)

## 2012-11-09 LAB — COMPREHENSIVE METABOLIC PANEL
Alkaline Phosphatase: 113 U/L (ref 39–117)
BUN: 6 mg/dL (ref 6–23)
CO2: 22 mEq/L (ref 19–32)
Creatinine, Ser: 0.65 mg/dL (ref 0.50–1.35)
GFR calc Af Amer: >90 mL/min (ref >90)
Glucose, Bld: 124 mg/dL — ABNORMAL HIGH (ref 70–99)
Potassium: 3.4 mEq/L — ABNORMAL LOW (ref 3.5–5.1)
Total Protein: 6.1 g/dL (ref 6.0–8.3)

## 2012-11-09 LAB — PHOSPHORUS: Phosphorus: 3.7 mg/dL (ref 2.3–4.6)

## 2012-11-09 LAB — TROPONIN I: Troponin I: <0.3 ng/mL (ref <0.30)

## 2012-11-09 LAB — OSMOLALITY, URINE: Osmolality, Ur: 476 mOsm/kg (ref 390–1090)

## 2012-11-09 LAB — LEGIONELLA ANTIGEN, URINE

## 2012-11-09 MED ORDER — POTASSIUM CHLORIDE 20 MEQ/15ML (10%) PO LIQD
20.0000 meq | Freq: Once | ORAL | Status: AC
  Administered 2012-11-09: 20 meq via ORAL
  Filled 2012-11-09: qty 15

## 2012-11-09 MED ORDER — SODIUM CHLORIDE 0.9 % IV SOLN
INTRAVENOUS | Status: DC
  Administered 2012-11-09 – 2012-11-10 (×3): via INTRAVENOUS
  Filled 2012-11-09 (×4): qty 1000

## 2012-11-09 MED ORDER — IPRATROPIUM BROMIDE 0.02 % IN SOLN
0.5000 mg | Freq: Four times a day (QID) | RESPIRATORY_TRACT | Status: DC
  Administered 2012-11-09 – 2012-11-10 (×6): 0.5 mg via RESPIRATORY_TRACT
  Filled 2012-11-09 (×6): qty 2.5

## 2012-11-09 MED ORDER — SODIUM CHLORIDE 0.9 % IV SOLN
1000.0000 mg | Freq: Once | INTRAVENOUS | Status: AC
  Administered 2012-11-09: 1000 mg via INTRAVENOUS
  Filled 2012-11-09: qty 10

## 2012-11-09 MED ORDER — POTASSIUM CHLORIDE 10 MEQ/100ML IV SOLN
10.0000 meq | INTRAVENOUS | Status: AC
  Administered 2012-11-09 (×4): 10 meq via INTRAVENOUS
  Filled 2012-11-09 (×4): qty 100

## 2012-11-09 MED ORDER — MAGNESIUM SULFATE 40 MG/ML IJ SOLN
2.0000 g | Freq: Once | INTRAMUSCULAR | Status: AC
  Administered 2012-11-09: 2 g via INTRAVENOUS
  Filled 2012-11-09: qty 50

## 2012-11-09 MED ORDER — CHLORHEXIDINE GLUCONATE CLOTH 2 % EX PADS
6.0000 | MEDICATED_PAD | Freq: Every day | CUTANEOUS | Status: AC
  Administered 2012-11-09 – 2012-11-13 (×5): 6 via TOPICAL

## 2012-11-09 MED ORDER — SODIUM CHLORIDE 0.9 % IV SOLN
500.0000 mg | Freq: Two times a day (BID) | INTRAVENOUS | Status: DC
  Administered 2012-11-09 – 2012-11-11 (×5): 500 mg via INTRAVENOUS
  Filled 2012-11-09 (×8): qty 5

## 2012-11-09 MED ORDER — VANCOMYCIN HCL 10 G IV SOLR
1250.0000 mg | Freq: Two times a day (BID) | INTRAVENOUS | Status: DC
  Administered 2012-11-09 – 2012-11-10 (×2): 1250 mg via INTRAVENOUS
  Filled 2012-11-09 (×3): qty 1250

## 2012-11-09 MED ORDER — ALBUTEROL SULFATE (5 MG/ML) 0.5% IN NEBU
2.5000 mg | INHALATION_SOLUTION | Freq: Four times a day (QID) | RESPIRATORY_TRACT | Status: DC
  Administered 2012-11-09 – 2012-11-10 (×6): 2.5 mg via RESPIRATORY_TRACT
  Filled 2012-11-09 (×6): qty 0.5

## 2012-11-09 MED ORDER — MUPIROCIN 2 % EX OINT
1.0000 "application " | TOPICAL_OINTMENT | Freq: Two times a day (BID) | CUTANEOUS | Status: DC
  Administered 2012-11-09 – 2012-11-13 (×9): 1 via NASAL
  Filled 2012-11-09: qty 22

## 2012-11-09 MED ORDER — VANCOMYCIN HCL IN DEXTROSE 1-5 GM/200ML-% IV SOLN
1000.0000 mg | Freq: Three times a day (TID) | INTRAVENOUS | Status: DC
  Administered 2012-11-09: 1000 mg via INTRAVENOUS
  Filled 2012-11-09 (×2): qty 200

## 2012-11-09 MED ORDER — ACETAMINOPHEN 325 MG PO TABS
650.0000 mg | ORAL_TABLET | Freq: Four times a day (QID) | ORAL | Status: DC | PRN
  Administered 2012-11-09 – 2012-11-12 (×8): 650 mg via ORAL
  Filled 2012-11-09 (×9): qty 2

## 2012-11-09 NOTE — Procedures (Signed)
History: 62 year old male with seizures, possibly from alcohol withdrawal   Background: There is a well defined posterior dominant rhythm of 9.5 Hz that attenuates with eye opening. The background consisted of intermixed alpha and beta activities. No drowsiness or sleep was recorded  Photic stimulation: Physiologic driving is not performed  EEG Abnormalities: None  Clinical Interpretation: This normal EEG is recorded in the waking state. There was no seizure or seizure predisposition recorded on this study.   Ritta Slot, MD [facility] [phone]  If 7pm- 7am, please page neurology on call at [phone].

## 2012-11-09 NOTE — Consult Note (Addendum)
Reason for Consult: Alcohol withdrawal seizures.  HPI:                                                                                                                                          Juan [name] is an 62 y.o. male history of alcohol abuse, hypertension, hyperlipidemia and congestive heart failure, who was admitted on 11/08/2012 following a witnessed generalized seizure at home. Patient stopped drinking one day prior to admission. His previous history of alcohol withdrawal seizures 2 years ago. CT scan of his head on this admission showed no acute intracranial abnormality. Patient had a recurrent seizure early this morning. Following his second seizure I recommended he be treated with an antiepileptic drug and Keppra was ordered 1000 mg loading dose of 500 mg every 12 hours. He said no further seizure activity. EEG obtained today was normal.  Past Medical History  Diagnosis Date  . CHF (congestive heart failure) 10/2010    ECHO:  EF 40%, Grade II diastolic dysfunction  . Alcoholism   . Allergy     seasonal   . Anxiety   . Depression   . Asthma   . Hyperlipidemia   . Hypertension   . Seizures     Past Surgical History  Procedure Laterality Date  . Removal of ingrown toenail      Family History  Problem Relation Age of Onset  . Alzheimer's disease Mother   . Diabetes Mother   . Heart disease Maternal Uncle     Social History:  reports that he has been smoking Cigarettes.  He has a 40 pack-year smoking history. He does not have any smokeless tobacco history on file. He reports that  drinks alcohol. He reports that he does not use illicit drugs.  No Known Allergies  MEDICATIONS:                                                                                                                     I have reviewed the patient's current medications.   ROS:  History obtained from spouse and the patient  General ROS: negative for - chills, fatigue, fever, night sweats, weight gain or weight loss Psychological ROS: negative for - behavioral disorder, hallucinations, memory difficulties, mood swings or suicidal ideation Ophthalmic ROS: negative for - blurry vision, double vision, eye pain or loss of vision ENT ROS: negative for - epistaxis, nasal discharge, oral lesions, sore throat, tinnitus or vertigo Allergy and Immunology ROS: negative for - hives or itchy/watery eyes Hematological and Lymphatic ROS: negative for - bleeding problems, bruising or swollen lymph nodes Endocrine ROS: negative for - galactorrhea, hair pattern changes, polydipsia/polyuria or temperature intolerance Respiratory ROS: negative for - cough, hemoptysis, shortness of breath or wheezing Cardiovascular ROS: negative for - chest pain, dyspnea on exertion, edema or irregular heartbeat Gastrointestinal ROS: negative for - abdominal pain, diarrhea, hematemesis, nausea/vomiting or stool incontinence Genito-Urinary ROS: negative for - dysuria, hematuria, incontinence or urinary frequency/urgency Musculoskeletal ROS: negative for - joint swelling or muscular weakness Neurological ROS: as noted in HPI Dermatological ROS: negative for rash and skin lesion changes   Blood pressure 129/79, pulse 80, temperature 98.5 F (36.9 C), temperature source Oral, resp. rate 16, height 5\' 8"  (1.727 m), weight 81.2 kg (179 lb 0.2 oz), SpO2 97.00%.   Neurologic Examination:                                                                                                      Mental Status: Alert, oriented x3.  Speech fluent without evidence of aphasia. Able to follow commands without difficulty. Cranial Nerves: II-Visual fields were normal. III/IV/VI-Pupils were equal and reacted. Extraocular movements were full with mild alternating exotropia noted.    V/VII-no facial numbness and  no facial weakness. VIII-normal. X-normal speech and symmetrical palatal movement. Motor: 5/5 bilaterally with normal tone and bulk Sensory: Normal throughout. Deep Tendon Reflexes: 2+ and symmetric. Plantars: Flexor bilaterally Cerebellar: Normal finger-to-nose testing.  No results found for this basename: cbc, bmp, coags, chol, tri, ldl, hga1c    Results for orders placed during the hospital encounter of 11/08/12 (from the past 48 hour(s))  CBC WITH DIFFERENTIAL     Status: Abnormal   Collection Time    11/08/12  4:30 PM      Result Value Range   WBC 8.7  4.0 - 10.5 K/uL   RBC 4.55  4.22 - 5.81 MIL/uL   Hemoglobin 14.8  13.0 - 17.0 g/dL   HCT 16.1  09.6 - 04.5 %   MCV 90.1  78.0 - 100.0 fL   MCH 32.5  26.0 - 34.0 pg   MCHC 36.1 (*) 30.0 - 36.0 g/dL   RDW 40.9  81.1 - 91.4 %   Platelets 204  150 - 400 K/uL   Neutrophils Relative % 80 (*) 43 - 77 %   Neutro Abs 7.0  1.7 - 7.7 K/uL   Lymphocytes Relative 10 (*) 12 - 46 %   Lymphs Abs 0.9  0.7 - 4.0 K/uL   Monocytes Relative 8  3 - 12 %   Monocytes Absolute 0.7  0.1 - 1.0  K/uL   Eosinophils Relative 2  0 - 5 %   Eosinophils Absolute 0.2  0.0 - 0.7 K/uL   Basophils Relative 1  0 - 1 %   Basophils Absolute 0.1  0.0 - 0.1 K/uL  GLUCOSE, CAPILLARY     Status: Abnormal   Collection Time    11/08/12  4:35 PM      Result Value Range   Glucose-Capillary 180 (*) 70 - 99 mg/dL   Comment 1 Documented in Chart     Comment 2 Notify RN    POCT I-STAT, CHEM 8     Status: Abnormal   Collection Time    11/08/12  4:38 PM      Result Value Range   Sodium 128 (*) 135 - 145 mEq/L   Potassium 3.3 (*) 3.5 - 5.1 mEq/L   Chloride 91 (*) 96 - 112 mEq/L   BUN 7  6 - 23 mg/dL   Creatinine, Ser 5.78  0.50 - 1.35 mg/dL   Glucose, Bld 469 (*) 70 - 99 mg/dL   Calcium, Ion 6.29 (*) 1.13 - 1.30 mmol/L   TCO2 17  0 - 100 mmol/L   Hemoglobin 16.3  13.0 - 17.0 g/dL   HCT 52.8  41.3 - 24.4 %  URINALYSIS, ROUTINE W REFLEX MICROSCOPIC     Status:  Abnormal   Collection Time    11/08/12  5:34 PM      Result Value Range   Color, Urine YELLOW  YELLOW   APPearance CLEAR  CLEAR   Specific Gravity, Urine 1.018  1.005 - 1.030   pH 6.0  5.0 - 8.0   Glucose, UA NEGATIVE  NEGATIVE mg/dL   Hgb urine dipstick TRACE (*) NEGATIVE   Bilirubin Urine NEGATIVE  NEGATIVE   Ketones, ur NEGATIVE  NEGATIVE mg/dL   Protein, ur 30 (*) NEGATIVE mg/dL   Urobilinogen, UA 1.0  0.0 - 1.0 mg/dL   Nitrite NEGATIVE  NEGATIVE   Leukocytes, UA NEGATIVE  NEGATIVE  URINE MICROSCOPIC-ADD ON     Status: None   Collection Time    11/08/12  5:34 PM      Result Value Range   WBC, UA 0-2  <3 WBC/hpf  URINE RAPID DRUG SCREEN (HOSP PERFORMED)     Status: None   Collection Time    11/08/12  7:14 PM      Result Value Range   Opiates NONE DETECTED  NONE DETECTED   Cocaine NONE DETECTED  NONE DETECTED   Benzodiazepines NONE DETECTED  NONE DETECTED   Amphetamines NONE DETECTED  NONE DETECTED   Tetrahydrocannabinol NONE DETECTED  NONE DETECTED   Barbiturates NONE DETECTED  NONE DETECTED   Comment:            DRUG SCREEN FOR MEDICAL PURPOSES     ONLY.  IF CONFIRMATION IS NEEDED     FOR ANY PURPOSE, NOTIFY LAB     WITHIN 5 DAYS.                LOWEST DETECTABLE LIMITS     FOR URINE DRUG SCREEN     Drug Class       Cutoff (ng/mL)     Amphetamine      1000     Barbiturate      200     Benzodiazepine   200     Tricyclics       300     Opiates  300     Cocaine          300     THC              50  CG4 I-STAT (LACTIC ACID)     Status: None   Collection Time    11/08/12  7:24 PM      Result Value Range   Lactic Acid, Venous 1.72  0.5 - 2.2 mmol/L  MRSA PCR SCREENING     Status: Abnormal   Collection Time    11/08/12  9:56 PM      Result Value Range   MRSA by PCR POSITIVE (*) NEGATIVE   Comment:            The GeneXpert MRSA Assay (FDA     approved for NASAL specimens     only), is one component of a     comprehensive MRSA colonization      surveillance program. It is not     intended to diagnose MRSA     infection nor to guide or     monitor treatment for     MRSA infections.     RESULT CALLED TO, READ BACK BY AND VERIFIED WITH:     CALLED TO RN Verl Blalock [phone] @0350  THANEY     Performed at [facility]  CREATININE, SERUM     Status: None   Collection Time    11/08/12 10:24 PM      Result Value Range   Creatinine, Ser 0.62  0.50 - 1.35 mg/dL   Comment: RESULT REPEATED AND VERIFIED     DELTA CHECK NOTED   GFR calc non Af Amer >90  >90 mL/min   GFR calc Af Amer >90  >90 mL/min   Comment: (NOTE)     The eGFR has been calculated using the CKD EPI equation.     This calculation has not been validated in all clinical situations.     eGFR's persistently <90 mL/min signify possible Chronic Kidney     Disease.  PROTIME-INR     Status: None   Collection Time    11/08/12 10:24 PM      Result Value Range   Prothrombin Time 12.7  11.6 - 15.2 seconds   INR 0.97  0.00 - 1.49  TROPONIN I     Status: None   Collection Time    11/08/12 10:24 PM      Result Value Range   Troponin I <0.30  <0.30 ng/mL   Comment:            Due to the release kinetics of cTnI,     a negative result within the first hours     of the onset of symptoms does not rule out     myocardial infarction with certainty.     If myocardial infarction is still suspected,     repeat the test at appropriate intervals.  BLOOD GAS, ARTERIAL     Status: Abnormal   Collection Time    11/09/12 12:46 AM      Result Value Range   O2 Content 2.0     Delivery systems NASAL CANNULA     pH, Arterial 7.363  7.350 - 7.450   pCO2 arterial 35.9  35.0 - 45.0 mmHg   pO2, Arterial 78.5 (*) 80.0 - 100.0 mmHg   Bicarbonate 19.9 (*) 20.0 - 24.0 mEq/L   TCO2 18.0  0 - 100 mmol/L   Acid-base deficit 4.4 (*)  0.0 - 2.0 mmol/L   O2 Saturation 94.7     Patient temperature 98.6     Collection site RIGHT RADIAL     Drawn by [phone]     Sample type ARTERIAL DRAW      Allens test (pass/fail) PASS  PASS  COMPREHENSIVE METABOLIC PANEL     Status: Abnormal   Collection Time    11/09/12  3:30 AM      Result Value Range   Sodium 127 (*) 135 - 145 mEq/L   Potassium 3.4 (*) 3.5 - 5.1 mEq/L   Chloride 94 (*) 96 - 112 mEq/L   CO2 22  19 - 32 mEq/L   Glucose, Bld 124 (*) 70 - 99 mg/dL   BUN 6  6 - 23 mg/dL   Creatinine, Ser 6.04  0.50 - 1.35 mg/dL   Calcium 8.5  8.4 - 54.0 mg/dL   Total Protein 6.1  6.0 - 8.3 g/dL   Albumin 2.9 (*) 3.5 - 5.2 g/dL   AST 67 (*) 0 - 37 U/L   ALT 33  0 - 53 U/L   Alkaline Phosphatase 113  39 - 117 U/L   Total Bilirubin 0.9  0.3 - 1.2 mg/dL   GFR calc non Af Amer >90  >90 mL/min   GFR calc Af Amer >90  >90 mL/min   Comment: (NOTE)     The eGFR has been calculated using the CKD EPI equation.     This calculation has not been validated in all clinical situations.     eGFR's persistently <90 mL/min signify possible Chronic Kidney     Disease.  TROPONIN I     Status: None   Collection Time    11/09/12  3:30 AM      Result Value Range   Troponin I <0.30  <0.30 ng/mL   Comment:            Due to the release kinetics of cTnI,     a negative result within the first hours     of the onset of symptoms does not rule out     myocardial infarction with certainty.     If myocardial infarction is still suspected,     repeat the test at appropriate intervals.  LEGIONELLA ANTIGEN, URINE     Status: None   Collection Time    11/09/12  6:40 AM      Result Value Range   Specimen Description URINE, CLEAN CATCH     Special Requests NONE     Legionella Antigen, Urine       Value: Negative for Legionella pneumophilia serogroup 1     Performed at [facility]   Report Status 11/09/2012 FINAL    STREP PNEUMONIAE URINARY ANTIGEN     Status: None   Collection Time    11/09/12  6:40 AM      Result Value Range   Strep Pneumo Urinary Antigen NEGATIVE  NEGATIVE   Comment:            Infection due to S. pneumoniae     cannot be  absolutely ruled out     since the antigen present     may be below the detection limit     of the test.     Performed at [facility]  SODIUM, URINE, RANDOM     Status: None   Collection Time    11/09/12  6:40 AM      Result Value Range  Sodium, Ur 103     Comment: Performed at [facility]  CREATININE, URINE, RANDOM     Status: None   Collection Time    11/09/12  6:40 AM      Result Value Range   Creatinine, Urine 86.5     Comment: Performed at [facility]  OSMOLALITY, URINE     Status: None   Collection Time    11/09/12  6:40 AM      Result Value Range   Osmolality, Ur 476  390 - 1090 mOsm/kg   Comment: Performed at [facility]  TROPONIN I     Status: None   Collection Time    11/09/12  9:58 AM      Result Value Range   Troponin I <0.30  <0.30 ng/mL   Comment:            Due to the release kinetics of cTnI,     a negative result within the first hours     of the onset of symptoms does not rule out     myocardial infarction with certainty.     If myocardial infarction is still suspected,     repeat the test at appropriate intervals.  MAGNESIUM     Status: None   Collection Time    11/09/12 12:00 PM      Result Value Range   Magnesium 1.5  1.5 - 2.5 mg/dL  BASIC METABOLIC PANEL     Status: Abnormal   Collection Time    11/09/12 12:00 PM      Result Value Range   Sodium 129 (*) 135 - 145 mEq/L   Potassium 3.1 (*) 3.5 - 5.1 mEq/L   Chloride 97  96 - 112 mEq/L   CO2 22  19 - 32 mEq/L   Glucose, Bld 100 (*) 70 - 99 mg/dL   BUN 5 (*) 6 - 23 mg/dL   Creatinine, Ser 9.14  0.50 - 1.35 mg/dL   Calcium 8.4  8.4 - 78.2 mg/dL   GFR calc non Af Amer >90  >90 mL/min   GFR calc Af Amer >90  >90 mL/min   Comment: (NOTE)     The eGFR has been calculated using the CKD EPI equation.     This calculation has not been validated in all clinical situations.     eGFR's persistently <90 mL/min signify possible Chronic Kidney     Disease.   PHOSPHORUS     Status: None   Collection Time    11/09/12 12:00 PM      Result Value Range   Phosphorus 3.7  2.3 - 4.6 mg/dL  AMMONIA     Status: None   Collection Time    11/09/12 12:00 PM      Result Value Range   Ammonia 36  11 - 60 umol/L  VITAMIN B12     Status: None   Collection Time    11/09/12 12:00 PM      Result Value Range   Vitamin B-12 746  211 - 911 pg/mL   Comment: Performed at [facility]  TSH     Status: None   Collection Time    11/09/12 12:00 PM      Result Value Range   TSH 1.045  0.350 - 4.500 uIU/mL   Comment: Performed at [facility]    Dg Chest 2 View  11/08/2012   CLINICAL DATA:  Shortness of breath  EXAM: CHEST  2  VIEW  COMPARISON:  10/25/2010  FINDINGS: The cardiac shadow is within normal limits. The lungs are well aerated bilaterally. Some mild increased density is noted in the right upper lobe consistent with an acute infiltrate. No other focal abnormality is noted.  IMPRESSION: Right upper lobe infiltrate.   Electronically Signed   By: Alcide Clever M.D.   On: 11/08/2012 16:35   Ct Head Wo Contrast  11/08/2012   *RADIOLOGY REPORT*  Clinical Data: Witnessed seizure; wheezing, congestion and chest tightness.  CT HEAD WITHOUT CONTRAST  Technique:  Contiguous axial images were obtained from the base of the skull through the vertex without contrast.  Comparison: CT of the head performed 10/21/2010  Findings: There is no evidence of acute infarction, mass lesion, or intra- or extra-axial hemorrhage on CT.  Scattered periventricular matter change likely reflects small vessel ischemic microangiopathy.  The posterior fossa, including the cerebellum, brainstem and fourth ventricle, is within normal limits.  The third and lateral ventricles, and basal ganglia are unremarkable in appearance.  The cerebral hemispheres are symmetric in appearance, with normal gray- white differentiation.  No mass effect or midline shift is seen.  There is no evidence of  fracture; visualized osseous structures are unremarkable in appearance.  The orbits are within normal limits. Mucosal thickening is noted within the maxillary sinuses bilaterally.  The remaining paranasal sinuses and mastoid air cells are well-aerated.  No significant soft tissue abnormalities are seen.  IMPRESSION:  1.  No acute intracranial pathology seen on CT. 2.  Mild small vessel ischemic microangiopathy. 3.  Mucosal thickening in the maxillary sinuses bilaterally.   Original Report Authenticated By: Tonia Ghent, M.D.   Assessment/Plan: 62 year old male with recurrent alcohol withdrawal seizures. He said no recurrence of seizure since Keppra was started for seizure prevention. Patient has no indication of primary epileptic disorder, and Keppra is recommended for treatment during alcohol withdrawal period only.  Recommendations: 1. No further neurodiagnostic studies are indicated. 2. Recommend continuing Keppra for a total of 7 days then discontinue  We will continue to follow this patient with you.  C.R. Roseanne Reno, MD [facility] [phone]  11/09/2012, 9:34 PM

## 2012-11-09 NOTE — Progress Notes (Signed)
[facility] progress note. Chief complaint. Probable seizure activity. History of present illness. This 62 year old male with a significant past history of heavy alcohol use was noted at home to have seizure-like activity and was admitted for further workup. Patient does have a prior history of seizure though not on any antiseizure medications. Patient had a CT scan of the head on admission which was unremarkable for acute changes. He was noted by the staff to have become unresponsive and it was felt this represented a postictal period. I came to the bedside to see the patient and he is now more alert and able to converse with me but still is quite somnolent. He has no specific complaints and appears unaware of any possible seizure activity. Unfortunately no actual seizure activity was visualized by staff. Vital signs. Temperature 98.6, pulse 110, respiration 21, blood pressure 161/76. O2 sats 99%. General appearance. Well-developed elderly male who is somnolent but conversational with stimulation. Cardiac. Rate and rhythm regular. Lungs. Some scattered rhonchi in the bases particularly on the right. Abdomen. Soft with positive bowel sounds. No pain. Neurologic. Cranial nerves II through XII grossly intact. No unilateral or focal defects. Impression/plan. Problem #1. Probable seizure activity. Decreased level of consciousness most consistent with postictal period. I did discuss the case with Doctor Roseanne Reno neurology and he recommends a loading dose of Keppra 1000 mg IV followed by Keppra 500 mg IV every 12 hours as seizure prophylaxis.

## 2012-11-09 NOTE — Progress Notes (Signed)
ANTIBIOTIC CONSULT NOTE - Follow-up  Pharmacy Consult for vancomycin, Pharmacy is consulted for antibiotic monitoring  Indication: pneumonia  No Known Allergies  Patient Measurements: Height: 5\' 8"  (172.7 cm) Weight: 179 lb 0.2 oz (81.2 kg) IBW/kg (Calculated) : 68.4 Adjusted Body Weight:   Vital Signs: Temp: 99.2 F (37.3 C) (10/06 0800) Temp src: Oral (10/06 0800) BP: 139/78 mmHg (10/06 0900) Pulse Rate: 112 (10/06 0900) Intake/Output from previous day: 10/05 0701 - 10/06 0700 In: 3622.5 [I.V.:3250; IV Piggyback:372.5] Out: 1200 [Urine:1200] Intake/Output from this shift: Total I/O In: 230 [I.V.:125; IV Piggyback:105] Out: -   Labs:  Recent Labs  11/08/12 1630 11/08/12 1638 11/08/12 2224 11/09/12 0330  WBC 8.7  --   --   --   HGB 14.8 16.3  --   --   PLT 204  --   --   --   CREATININE  --  1.00 0.62 0.65   Estimated Creatinine Clearance: 95 ml/min (by C-G formula based on Cr of 0.65). No results found for this basename: VANCOTROUGH, Leodis Binet, VANCORANDOM, GENTTROUGH, GENTPEAK, GENTRANDOM, TOBRATROUGH, TOBRAPEAK, TOBRARND, AMIKACINPEAK, AMIKACINTROU, AMIKACIN,  in the last 72 hours   Microbiology: Recent Results (from the past 720 hour(s))  MRSA PCR SCREENING     Status: Abnormal   Collection Time    11/08/12  9:56 PM      Result Value Range Status   MRSA by PCR POSITIVE (*) NEGATIVE Final   Comment:            The GeneXpert MRSA Assay (FDA     approved for NASAL specimens     only), is one component of a     comprehensive MRSA colonization     surveillance program. It is not     intended to diagnose MRSA     infection nor to guide or     monitor treatment for     MRSA infections.     RESULT CALLED TO, READ BACK BY AND VERIFIED WITH:     CALLED TO RN Verl Blalock [phone] @0350  THANEY     Performed at [facility]    Medical History: Past Medical History  Diagnosis Date  . CHF (congestive heart failure) 10/2010    ECHO:  EF 40%, Grade II  diastolic dysfunction  . Alcoholism   . Allergy     seasonal   . Anxiety   . Depression   . Asthma   . Hyperlipidemia   . Hypertension   . Seizures     Medications:  Anti-infectives   Start     Dose/Rate Route Frequency Ordered Stop   11/09/12 0600  piperacillin-tazobactam (ZOSYN) IVPB 3.375 g     3.375 g 12.5 mL/hr over 240 Minutes Intravenous 3 times per day 11/08/12 2152     11/09/12 0600  vancomycin (VANCOCIN) IVPB 1000 mg/200 mL premix     1,000 mg 200 mL/hr over 60 Minutes Intravenous Every 8 hours 11/09/12 0504     11/08/12 2200  piperacillin-tazobactam (ZOSYN) IVPB 3.375 g  Status:  Discontinued     3.375 g 100 mL/hr over 30 Minutes Intravenous  Once 11/08/12 2152 11/08/12 2159   11/08/12 1915  vancomycin (VANCOCIN) IVPB 1000 mg/200 mL premix     1,000 mg 200 mL/hr over 60 Minutes Intravenous  Once 11/08/12 1906 11/08/12 2034   11/08/12 1915  piperacillin-tazobactam (ZOSYN) IVPB 3.375 g  Status:  Discontinued     3.375 g 100 mL/hr over 30 Minutes Intravenous  Once 11/08/12  1906 11/08/12 2151     Assessment:  89 YOM with h/o EtOHism, presents with seizure (suspected from EtOH withdrawal) and suspected aspiration pneumonia and started broad spectrum antibiotics.   11/09/2012 >> vanc >> 11/09/2012 >> zosyn >>  Tmax: 99.8 WBCs: 8.7 Renal: Scr = 0.65 for CRCl = 44ml/min (C-G) and >167ml/min (N)  10/5 blood: pending 10/ sputum: ordered Strep Ag: neg Legionella Ag: pending  Dose changes/drug level info:  10/6: change vanco from 1gm IV q8h to 1250mg  IV q12h (concerned about q8h d/t age and chronic EtOHism)   Goal of Therapy:  Vancomycin trough level 15-20 mcg/ml Zosyn based on renal function   Plan:   Change vancomycin to 1250mg  IV q12h (from 1gm IV q8h) d/t age and chronic EtOHism.    Check vancomycin trough if vancomycin continues  Zosyn 3.375g IV Q8H infused over 4hrs.   Consider narrowing antibiotics as patient came from community (Consider d/c  vancomycin and continue adequate K. Pneumoniae coverage.  Juliette Alcide, PharmD, BCPS.   Pager: [phone]  11/09/2012,10:50 AM

## 2012-11-09 NOTE — Progress Notes (Signed)
Utilization review completed.  

## 2012-11-09 NOTE — Progress Notes (Signed)
ANTIBIOTIC CONSULT NOTE - INITIAL  Pharmacy Consult for vancomycin, Pharmacy is consulted for antibiotic monitoring  Indication: pneumonia  No Known Allergies  Patient Measurements: Height: 5\' 8"  (172.7 cm) Weight: 179 lb 0.2 oz (81.2 kg) IBW/kg (Calculated) : 68.4 Adjusted Body Weight:   Vital Signs: Temp: 98.6 F (37 C) (10/05 2144) Temp src: Oral (10/05 2144) BP: 162/86 mmHg (10/06 0416) Pulse Rate: 122 (10/06 0416) Intake/Output from previous day: 10/05 0701 - 10/06 0700 In: 3035 [I.V.:2875; IV Piggyback:160] Out: 200 [Urine:200] Intake/Output from this shift: Total I/O In: 2035 [I.V.:1875; IV Piggyback:160] Out: 200 [Urine:200]  Labs:  Recent Labs  11/08/12 1630 11/08/12 1638 11/08/12 2224 11/09/12 0330  WBC 8.7  --   --   --   HGB 14.8 16.3  --   --   PLT 204  --   --   --   CREATININE  --  1.00 0.62 0.65   Estimated Creatinine Clearance: 95 ml/min (by C-G formula based on Cr of 0.65). No results found for this basename: VANCOTROUGH, Leodis Binet, VANCORANDOM, GENTTROUGH, GENTPEAK, GENTRANDOM, TOBRATROUGH, TOBRAPEAK, TOBRARND, AMIKACINPEAK, AMIKACINTROU, AMIKACIN,  in the last 72 hours   Microbiology: Recent Results (from the past 720 hour(s))  MRSA PCR SCREENING     Status: Abnormal   Collection Time    11/08/12  9:56 PM      Result Value Range Status   MRSA by PCR POSITIVE (*) NEGATIVE Final   Comment:            The GeneXpert MRSA Assay (FDA     approved for NASAL specimens     only), is one component of a     comprehensive MRSA colonization     surveillance program. It is not     intended to diagnose MRSA     infection nor to guide or     monitor treatment for     MRSA infections.     RESULT CALLED TO, READ BACK BY AND VERIFIED WITH:     CALLED TO RN Verl Blalock [phone] @0350  THANEY     Performed at [facility]    Medical History: Past Medical History  Diagnosis Date  . CHF (congestive heart failure) 10/2010    ECHO:  EF 40%, Grade  II diastolic dysfunction  . Alcoholism   . Allergy     seasonal   . Anxiety   . Depression   . Asthma   . Hyperlipidemia   . Hypertension   . Seizures     Medications:  Anti-infectives   Start     Dose/Rate Route Frequency Ordered Stop   11/09/12 0600  piperacillin-tazobactam (ZOSYN) IVPB 3.375 g     3.375 g 12.5 mL/hr over 240 Minutes Intravenous 3 times per day 11/08/12 2152     11/09/12 0600  vancomycin (VANCOCIN) IVPB 1000 mg/200 mL premix     1,000 mg 200 mL/hr over 60 Minutes Intravenous Every 8 hours 11/09/12 0504     11/08/12 2200  piperacillin-tazobactam (ZOSYN) IVPB 3.375 g  Status:  Discontinued     3.375 g 100 mL/hr over 30 Minutes Intravenous  Once 11/08/12 2152 11/08/12 2159   11/08/12 1915  vancomycin (VANCOCIN) IVPB 1000 mg/200 mL premix     1,000 mg 200 mL/hr over 60 Minutes Intravenous  Once 11/08/12 1906 11/08/12 2034   11/08/12 1915  piperacillin-tazobactam (ZOSYN) IVPB 3.375 g  Status:  Discontinued     3.375 g 100 mL/hr over 30 Minutes Intravenous  Once 11/08/12  1906 11/08/12 2151     Assessment: Patient with PNA. First dose of antibiotics already given.  Goal of Therapy:  Vancomycin trough level 15-20 mcg/ml Zosyn based on renal function   Plan:  Measure antibiotic drug levels at steady state Follow up culture results Vancomycin 1gm iv q8hr Zosyn 3.375g IV Q8H infused over 4hrs.   Darlina Guys, Jacquenette Shone Crowford 11/09/2012,5:05 AM

## 2012-11-09 NOTE — Progress Notes (Signed)
EEG completed; results pending.    

## 2012-11-09 NOTE — Progress Notes (Signed)
[facility] PROGRESS NOTE  Juan [name]:[id number] DOB: 07/21/1950 DOA: 11/08/2012 PCP: Renold Don, MD  Assessment/Plan: Alcohol withdrawal seizure -Question whether patient needs antiepileptics -Possible neurology -EEG -Alcohol withdrawal protocol -Patient drinks 4-5 beers per day times the last 15 years Acute encephalopathy -Check ammonia -EEG -Troponins negative x3 -B12, RBC folate,TSH Hyponatremia -May be contributing to encephalopathy -Appears to be chronic ranging 126-131 -Also suspect a component of volume depletion -Recheck BMP today -Continue IV fluids Aspiration pneumonia -Continue Zosyn -Aerosolized albuterol and Atrovent -Aspiration precautions Polysubstance abuse -Tobacco cessation discussed RBBB -Was present in September 2012 -Troponins negative -Echocardiogram -Patient had a short episode of asymptomatic nonsustained ventricular tachycardia today -Check magnesium, replete potassium Hypokalemia -Replete Hypertension -Continue carvedilol and lisinopril  Family Communication:   wife at beside Disposition Plan:   Home when medically stable       Procedures/Studies: Dg Chest 2 View  11/08/2012   CLINICAL DATA:  Shortness of breath  EXAM: CHEST  2 VIEW  COMPARISON:  10/25/2010  FINDINGS: The cardiac shadow is within normal limits. The lungs are well aerated bilaterally. Some mild increased density is noted in the right upper lobe consistent with an acute infiltrate. No other focal abnormality is noted.  IMPRESSION: Right upper lobe infiltrate.   Electronically Signed   By: Alcide Clever M.D.   On: 11/08/2012 16:35   Ct Head Wo Contrast  11/08/2012   *RADIOLOGY REPORT*  Clinical Data: Witnessed seizure; wheezing, congestion and chest tightness.  CT HEAD WITHOUT CONTRAST  Technique:  Contiguous axial images were obtained from the base of the skull through the vertex without contrast.  Comparison: CT of the head performed 10/21/2010  Findings:  There is no evidence of acute infarction, mass lesion, or intra- or extra-axial hemorrhage on CT.  Scattered periventricular matter change likely reflects small vessel ischemic microangiopathy.  The posterior fossa, including the cerebellum, brainstem and fourth ventricle, is within normal limits.  The third and lateral ventricles, and basal ganglia are unremarkable in appearance.  The cerebral hemispheres are symmetric in appearance, with normal gray- white differentiation.  No mass effect or midline shift is seen.  There is no evidence of fracture; visualized osseous structures are unremarkable in appearance.  The orbits are within normal limits. Mucosal thickening is noted within the maxillary sinuses bilaterally.  The remaining paranasal sinuses and mastoid air cells are well-aerated.  No significant soft tissue abnormalities are seen.  IMPRESSION:  1.  No acute intracranial pathology seen on CT. 2.  Mild small vessel ischemic microangiopathy. 3.  Mucosal thickening in the maxillary sinuses bilaterally.   Original Report Authenticated By: Tonia Ghent, M.D.         Subjective: Patient is drowsy but arouses easily. He complains of a mild bifrontal headache. Denies any visual disturbance. Denies any fevers, chills, chest discomfort, shortness breath, nausea, vomiting, abdominal pain, dysuria, hematuria.  Objective: Filed Vitals:   11/09/12 0900 11/09/12 0915 11/09/12 1000 11/09/12 1100  BP: 139/78  139/79 125/72  Pulse: 112  109 100  Temp:      TempSrc:      Resp: 24  10 19   Height:      Weight:      SpO2: 95% 95% 94% 94%    Intake/Output Summary (Last 24 hours) at 11/09/12 1134 Last data filed at 11/09/12 1100  Gross per 24 hour  Intake 4227.5 ml  Output   1200 ml  Net 3027.5 ml   Weight change:  Exam:   General:  Pt  is alert, follows commands appropriately, not in acute distress  HEENT: No icterus, No thrush,  Bremer/AT  Cardiovascular: RRR, S1/S2, no rubs, no  gallops  Respiratory: Bilateral scattered rhonchi. No wheezing. Good air movement.  Abdomen: Soft/+BS, non tender, non distended, no guarding  Extremities: trace edema, No lymphangitis, No petechiae, No rashes, no synovitis  Data Reviewed: Basic Metabolic Panel:  Recent Labs Lab 11/08/12 1638 11/08/12 2224 11/09/12 0330  NA 128*  --  127*  K 3.3*  --  3.4*  CL 91*  --  94*  CO2  --   --  22  GLUCOSE 191*  --  124*  BUN 7  --  6  CREATININE 1.00 0.62 0.65  CALCIUM  --   --  8.5   Liver Function Tests:  Recent Labs Lab 11/09/12 0330  AST 67*  ALT 33  ALKPHOS 113  BILITOT 0.9  PROT 6.1  ALBUMIN 2.9*   No results found for this basename: LIPASE, AMYLASE,  in the last 168 hours No results found for this basename: AMMONIA,  in the last 168 hours CBC:  Recent Labs Lab 11/08/12 1630 11/08/12 1638  WBC 8.7  --   NEUTROABS 7.0  --   HGB 14.8 16.3  HCT 41.0 48.0  MCV 90.1  --   PLT 204  --    Cardiac Enzymes:  Recent Labs Lab 11/08/12 2224 11/09/12 0330 11/09/12 0958  TROPONINI <0.30 <0.30 <0.30   BNP: No components found with this basename: POCBNP,  CBG:  Recent Labs Lab 11/08/12 1635  GLUCAP 180*    Recent Results (from the past 240 hour(s))  MRSA PCR SCREENING     Status: Abnormal   Collection Time    11/08/12  9:56 PM      Result Value Range Status   MRSA by PCR POSITIVE (*) NEGATIVE Final   Comment:            The GeneXpert MRSA Assay (FDA     approved for NASAL specimens     only), is one component of a     comprehensive MRSA colonization     surveillance program. It is not     intended to diagnose MRSA     infection nor to guide or     monitor treatment for     MRSA infections.     RESULT CALLED TO, READ BACK BY AND VERIFIED WITH:     CALLED TO RN Verl Blalock [phone] @0350  THANEY     Performed at [facility]     Scheduled Meds: . carvedilol  3.125 mg Oral BID WC  . Chlorhexidine Gluconate Cloth  6 each Topical Q0600  .  clonazePAM  0.5 mg Oral BID  . enoxaparin (LOVENOX) injection  40 mg Subcutaneous Q24H  . levETIRAcetam  500 mg Intravenous Q12H  . lisinopril  20 mg Oral Daily  . loratadine  10 mg Oral Daily  . LORazepam  0-4 mg Intravenous Q6H   Followed by  . [START ON 11/10/2012] LORazepam  0-4 mg Intravenous Q12H  . LORazepam  0-4 mg Oral Q6H   Followed by  . [START ON 11/10/2012] LORazepam  0-4 mg Oral Q12H  . mupirocin ointment  1 application Nasal BID  . pantoprazole  40 mg Oral Q1200  . piperacillin-tazobactam (ZOSYN)  IV  3.375 g Intravenous Q8H  . thiamine  100 mg Oral Daily   Or  . thiamine  100 mg Intravenous Daily  . tiotropium  18 mcg Inhalation Daily  . vancomycin  1,250 mg Intravenous Q12H  . venlafaxine XR  150 mg Oral Daily   Continuous Infusions: . sodium chloride 125 mL/hr at 11/09/12 1128     Angelica Wix, DO  [facility] Pager [phone]  If 7PM-7AM, please contact night-coverage [email or web address] Password [email or web address] 11/09/2012, 11:34 AM   LOS: 1 day

## 2012-11-09 NOTE — Plan of Care (Signed)
Problem: Phase I Progression Outcomes Goal: Pain controlled with appropriate interventions Outcome: Progressing Tylenol ordered prn for pain and fever. Goal: OOB as tolerated unless otherwise ordered Outcome: Progressing Pt is lethargic and intermittently sedated, bedrest for safety today. Goal: Voiding-avoid urinary catheter unless indicated Outcome: Progressing Pt voiding. Goal: Hemodynamically stable Outcome: Progressing Vitals are stable.  Pt did have an 11run of V-tach witnessed by this RN and MD.  Electrolytes being replaced as needed, pt asymptomatic.

## 2012-11-10 DIAGNOSIS — I4510 Unspecified right bundle-branch block: Secondary | ICD-10-CM

## 2012-11-10 DIAGNOSIS — I519 Heart disease, unspecified: Secondary | ICD-10-CM

## 2012-11-10 LAB — COMPREHENSIVE METABOLIC PANEL
ALT: 28 U/L (ref 0–53)
AST: 47 U/L — ABNORMAL HIGH (ref 0–37)
Alkaline Phosphatase: 95 U/L (ref 39–117)
CO2: 24 mEq/L (ref 19–32)
Chloride: 97 mEq/L (ref 96–112)
GFR calc non Af Amer: >90 mL/min (ref >90)
Glucose, Bld: 101 mg/dL — ABNORMAL HIGH (ref 70–99)
Potassium: 3.4 mEq/L — ABNORMAL LOW (ref 3.5–5.1)
Total Bilirubin: 0.7 mg/dL (ref 0.3–1.2)
Total Protein: 5.6 g/dL — ABNORMAL LOW (ref 6.0–8.3)

## 2012-11-10 LAB — CBC
HCT: 31.2 % — ABNORMAL LOW (ref 39.0–52.0)
Hemoglobin: 10.8 g/dL — ABNORMAL LOW (ref 13.0–17.0)
MCH: 32.1 pg (ref 26.0–34.0)
MCHC: 34.6 g/dL (ref 30.0–36.0)
MCV: 92.9 fL (ref 78.0–100.0)
RDW: 12.6 % (ref 11.5–15.5)
WBC: 8.3 10*3/uL (ref 4.0–10.5)

## 2012-11-10 LAB — FOLATE RBC: RBC Folate: 1458 ng/mL — ABNORMAL HIGH (ref >=366)

## 2012-11-10 MED ORDER — INFLUENZA VAC SPLIT QUAD 0.5 ML IM SUSP
0.5000 mL | INTRAMUSCULAR | Status: AC
  Administered 2012-11-11: 0.5 mL via INTRAMUSCULAR
  Filled 2012-11-10 (×2): qty 0.5

## 2012-11-10 MED ORDER — MENTHOL 3 MG MT LOZG
1.0000 | LOZENGE | OROMUCOSAL | Status: DC | PRN
  Administered 2012-11-10: 3 mg via ORAL
  Filled 2012-11-10: qty 9

## 2012-11-10 MED ORDER — POTASSIUM CHLORIDE CRYS ER 20 MEQ PO TBCR
20.0000 meq | EXTENDED_RELEASE_TABLET | Freq: Once | ORAL | Status: AC
  Administered 2012-11-10: 20 meq via ORAL
  Filled 2012-11-10: qty 1

## 2012-11-10 NOTE — Progress Notes (Signed)
*  PRELIMINARY RESULTS* Echocardiogram 2D Echocardiogram has been performed.  Juan [name] 11/10/2012, 1:36 PM

## 2012-11-10 NOTE — Progress Notes (Signed)
NEURO HOSPITALIST PROGRESS NOTE   SUBJECTIVE:                                                                                                                        patient has had no further seizures, feels comfortable, would like"to be able to get up and go to the bathroom on his own".   OBJECTIVE:                                                                                                                           Vital signs in last 24 hours: Temp:  [98.2 F (36.8 C)-99.1 F (37.3 C)] 98.6 F (37 C) (10/07 0355) Pulse Rate:  [79-109] 89 (10/07 0900) Resp:  [10-26] 23 (10/07 0900) BP: (111-162)/(64-100) 162/100 mmHg (10/07 0900) SpO2:  [94 %-100 %] 97 % (10/07 0900) Weight:  [84.8 kg (186 lb 15.2 oz)] 84.8 kg (186 lb 15.2 oz) (10/07 0355)  Intake/Output from previous day: 10/06 0701 - 10/07 0700 In: 3335 [P.O.:555; I.V.:1475; IV Piggyback:1305] Out: 751 [Urine:750; Stool:1] Intake/Output this shift: Total I/O In: 150 [I.V.:150] Out: -  Nutritional status: Cardiac  Past Medical History  Diagnosis Date  . CHF (congestive heart failure) 10/2010    ECHO:  EF 40%, Grade II diastolic dysfunction  . Alcoholism   . Allergy     seasonal   . Anxiety   . Depression   . Asthma   . Hyperlipidemia   . Hypertension   . Seizures      Neurologic Exam:  Mental Status: Alert, oriented, thought content appropriate.  Speech fluent without evidence of aphasia.  Able to follow 3 step commands without difficulty. Cranial Nerves: II:  Visual fields grossly normal, pupils equal, round, reactive to light and accommodation III,IV, VI: ptosis not present, extra-ocular motions intact bilaterally V,VII: smile symmetric, facial light touch sensation normal bilaterally VIII: hearing normal bilaterally IX,X: gag reflex present XI: bilateral shoulder shrug XII: midline tongue extension Motor: Right : Upper extremity   5/5    Left:     Upper extremity    5/5  Lower extremity   5/5     Lower extremity   5/5 Tone and bulk:normal tone throughout; no atrophy noted Sensory: Pinprick and  light touch intact throughout, bilaterally Deep Tendon Reflexes:  Right: Upper Extremity   Left: Upper extremity   biceps (C-5 to C-6) 2/4   biceps (C-5 to C-6) 2/4 tricep (C7) 2/4    triceps (C7) 2/4 Brachioradialis (C6) 2/4  Brachioradialis (C6) 2/4  Lower Extremity Lower Extremity  quadriceps (L-2 to L-4) 2/4   quadriceps (L-2 to L-4) 2/4 Achilles (S1) 2/4   Achilles (S1) 2/4  Plantars: Right: downgoing   Left: downgoing Cerebellar: normal finger-to-nose,  normal heel-to-shin test  CV: pulses palpable throughout    Lab Results: No results found for this basename: cbc, bmp, coags, chol, tri, ldl, hga1c   Lipid Panel No results found for this basename: CHOL, TRIG, HDL, CHOLHDL, VLDL, LDLCALC,  in the last 72 hours  Studies/Results: Dg Chest 2 View  11/08/2012   CLINICAL DATA:  Shortness of breath  EXAM: CHEST  2 VIEW  COMPARISON:  10/25/2010  FINDINGS: The cardiac shadow is within normal limits. The lungs are well aerated bilaterally. Some mild increased density is noted in the right upper lobe consistent with an acute infiltrate. No other focal abnormality is noted.  IMPRESSION: Right upper lobe infiltrate.   Electronically Signed   By: Alcide Clever M.D.   On: 11/08/2012 16:35   Ct Head Wo Contrast  11/08/2012   *RADIOLOGY REPORT*  Clinical Data: Witnessed seizure; wheezing, congestion and chest tightness.  CT HEAD WITHOUT CONTRAST  Technique:  Contiguous axial images were obtained from the base of the skull through the vertex without contrast.  Comparison: CT of the head performed 10/21/2010  Findings: There is no evidence of acute infarction, mass lesion, or intra- or extra-axial hemorrhage on CT.  Scattered periventricular matter change likely reflects small vessel ischemic microangiopathy.  The posterior fossa, including the cerebellum, brainstem  and fourth ventricle, is within normal limits.  The third and lateral ventricles, and basal ganglia are unremarkable in appearance.  The cerebral hemispheres are symmetric in appearance, with normal gray- white differentiation.  No mass effect or midline shift is seen.  There is no evidence of fracture; visualized osseous structures are unremarkable in appearance.  The orbits are within normal limits. Mucosal thickening is noted within the maxillary sinuses bilaterally.  The remaining paranasal sinuses and mastoid air cells are well-aerated.  No significant soft tissue abnormalities are seen.  IMPRESSION:  1.  No acute intracranial pathology seen on CT. 2.  Mild small vessel ischemic microangiopathy. 3.  Mucosal thickening in the maxillary sinuses bilaterally.   Original Report Authenticated By: Tonia Ghent, M.D.    MEDICATIONS                                                                                                                        Scheduled: . ipratropium  0.5 mg Nebulization Q6H   And  . albuterol  2.5 mg Nebulization Q6H  . carvedilol  3.125 mg Oral BID WC  . Chlorhexidine Gluconate Cloth  6 each Topical Q0600  .  clonazePAM  0.5 mg Oral BID  . enoxaparin (LOVENOX) injection  40 mg Subcutaneous Q24H  . levETIRAcetam  500 mg Intravenous Q12H  . lisinopril  20 mg Oral Daily  . loratadine  10 mg Oral Daily  . LORazepam  0-4 mg Intravenous Q6H   Followed by  . LORazepam  0-4 mg Intravenous Q12H  . LORazepam  0-4 mg Oral Q6H   Followed by  . LORazepam  0-4 mg Oral Q12H  . mupirocin ointment  1 application Nasal BID  . pantoprazole  40 mg Oral Q1200  . piperacillin-tazobactam (ZOSYN)  IV  3.375 g Intravenous Q8H  . thiamine  100 mg Oral Daily   Or  . thiamine  100 mg Intravenous Daily  . vancomycin  1,250 mg Intravenous Q12H  . venlafaxine XR  150 mg Oral Daily   Continuous: . sodium chloride 0.9 % 1,000 mL with potassium chloride 20 mEq infusion 75 mL/hr at 11/09/12 1311    WUJ:[id number], levalbuterol  ASSESSMENT/PLAN:                                                                                                            Seizure in the setting of hyponatremia and ETOH withdrawal.    Patient has and no further seizures while in Keppra 500 mg BID.  Would recommend to continue Keppra 500 mg BID for 7 days (peroid of ETOH withdrawal), continue CIWA and treat hyponatremia. EEG showed no epileptiform activity.  No further recommendations. Neurology will S/O .  Feel free to call with any further questions.     Assessment and plan discussed with with attending physician and they are in agreement.    Felicie Morn PA-C [facility] [phone]  11/10/2012, 9:12 AM

## 2012-11-10 NOTE — Progress Notes (Signed)
[facility] PROGRESS NOTE  Juan [name] BJY:[id number] DOB: 1950/07/03 DOA: 11/08/2012 PCP: Renold Don, MD  Brief history 62 year old male with a history of daily alcohol use for the past 15 years presented on 11/08/2012 after the wife noted him to have bilateral rhythmic jerking for about 30 seconds. He experienced a post ictal state. The patient had an alcohol withdrawal seizure approximately 2 years ago when he abstained from alcohol and his Klonopin. Neurology was consulted and recommended Keppra for one week. The patient had an episode of worsening encephalopathy during the hospitalization which was thought to be due to none witnessed seizure with postictal state. Workup has revealed aspiration pneumonia. He continues on Zosyn. His encephalopathy has improved. Assessment/Plan: Alcohol withdrawal seizure  -Question whether patient needs antiepileptics  -Appreciate neurology consult -Continue Keppra for a one-week (today is day #2)--change to po -EEG--negative for epileptiform discharge -Alcohol withdrawal protocol  -Patient drinks 4-5 beers per day times the last 15 years  -He states that he has a desire to quite  Acute encephalopathy  -Secondary to seizure -Resolved -Check ammonia--36   -Troponins negative x3  -B1--746  -RBC folate pending  -TSH--1.045 -PT eval Hyponatremia  -Appears to be chronic ranging 126-131  -Some improvement with IV NS -Also suspect a component of volume depletion  -Recheck BMP  Aspiration pneumonia  -Continue Zosyn  -Discontinue vancomycin  -Aerosolized albuterol and Atrovent  -Aspiration precautions  Polysubstance abuse  -Tobacco and alcohol cessation discussed  RBBB  -Patient having intermittent episodes of asymptomatic nonsustained Vtach -Was present in September 2012  -Troponins negative  -Echocardiogram--pending  -Check magnesium, replete potassium  Hypokalemia  -Replete  Hypertension  -Continue carvedilol and lisinopril   Family Communication: wife at beside  Disposition Plan: Home in 24-48hrs          Procedures/Studies: Dg Chest 2 View  11/08/2012   CLINICAL DATA:  Shortness of breath  EXAM: CHEST  2 VIEW  COMPARISON:  10/25/2010  FINDINGS: The cardiac shadow is within normal limits. The lungs are well aerated bilaterally. Some mild increased density is noted in the right upper lobe consistent with an acute infiltrate. No other focal abnormality is noted.  IMPRESSION: Right upper lobe infiltrate.   Electronically Signed   By: Alcide Clever M.D.   On: 11/08/2012 16:35   Ct Head Wo Contrast  11/08/2012   *RADIOLOGY REPORT*  Clinical Data: Witnessed seizure; wheezing, congestion and chest tightness.  CT HEAD WITHOUT CONTRAST  Technique:  Contiguous axial images were obtained from the base of the skull through the vertex without contrast.  Comparison: CT of the head performed 10/21/2010  Findings: There is no evidence of acute infarction, mass lesion, or intra- or extra-axial hemorrhage on CT.  Scattered periventricular matter change likely reflects small vessel ischemic microangiopathy.  The posterior fossa, including the cerebellum, brainstem and fourth ventricle, is within normal limits.  The third and lateral ventricles, and basal ganglia are unremarkable in appearance.  The cerebral hemispheres are symmetric in appearance, with normal gray- white differentiation.  No mass effect or midline shift is seen.  There is no evidence of fracture; visualized osseous structures are unremarkable in appearance.  The orbits are within normal limits. Mucosal thickening is noted within the maxillary sinuses bilaterally.  The remaining paranasal sinuses and mastoid air cells are well-aerated.  No significant soft tissue abnormalities are seen.  IMPRESSION:  1.  No acute intracranial pathology seen on CT. 2.  Mild small vessel ischemic microangiopathy. 3.  Mucosal thickening in the  maxillary sinuses bilaterally.   Original Report  Authenticated By: Tonia Ghent, M.D.         Subjective:  patient is more alert today. Denies any headache, fevers, chills, chest discomfort, shortness of breath. He has a nonproductive cough. Denies any nausea, vomiting, diarrhea, abdominal pain.   Objective: Filed Vitals:   11/10/12 0900 11/10/12 0943 11/10/12 1000 11/10/12 1100  BP: 162/100  140/80 138/85  Pulse: 89  93 83  Temp:      TempSrc:      Resp: 23  24 21   Height:      Weight:      SpO2: 97% 96% 94% 95%    Intake/Output Summary (Last 24 hours) at 11/10/12 1218 Last data filed at 11/10/12 1100  Gross per 24 hour  Intake   3080 ml  Output   1001 ml  Net   2079 ml   Weight change: 3.152 kg (6 lb 15.2 oz) Exam:   General:  Pt is alert, follows commands appropriately, not in acute distress  HEENT: No icterus, No thrush,  [place]/AT  Cardiovascular: RRR, S1/S2, no rubs, no gallops  Respiratory: bibasilar rales. No wheezing. Good air movement.   Abdomen: Soft/+BS, non tender, non distended, no guarding  Extremities: No edema, No lymphangitis, No petechiae, No rashes, no synovitis  Data Reviewed: Basic Metabolic Panel:  Recent Labs Lab 11/08/12 1638 11/08/12 2224 11/09/12 0330 11/09/12 1200 11/10/12 0325  NA 128*  --  127* 129* 129*  K 3.3*  --  3.4* 3.1* 3.4*  CL 91*  --  94* 97 97  CO2  --   --  22 22 24   GLUCOSE 191*  --  124* 100* 101*  BUN 7  --  6 5* 7  CREATININE 1.00 0.62 0.65 0.72 0.87  CALCIUM  --   --  8.5 8.4 8.1*  MG  --   --   --  1.5  --   PHOS  --   --   --  3.7  --    Liver Function Tests:  Recent Labs Lab 11/09/12 0330 11/10/12 0325  AST 67* 47*  ALT 33 28  ALKPHOS 113 95  BILITOT 0.9 0.7  PROT 6.1 5.6*  ALBUMIN 2.9* 2.5*   No results found for this basename: LIPASE, AMYLASE,  in the last 168 hours  Recent Labs Lab 11/09/12 1200  AMMONIA 36   CBC:  Recent Labs Lab 11/08/12 1630 11/08/12 1638 11/10/12 0325  WBC 8.7  --  8.3  NEUTROABS 7.0  --   --   HGB  14.8 16.3 10.8*  HCT 41.0 48.0 31.2*  MCV 90.1  --  92.9  PLT 204  --  140*   Cardiac Enzymes:  Recent Labs Lab 11/08/12 2224 11/09/12 0330 11/09/12 0958  TROPONINI <0.30 <0.30 <0.30   BNP: No components found with this basename: POCBNP,  CBG:  Recent Labs Lab 11/08/12 1635  GLUCAP 180*    Recent Results (from the past 240 hour(s))  MRSA PCR SCREENING     Status: Abnormal   Collection Time    11/08/12  9:56 PM      Result Value Range Status   MRSA by PCR POSITIVE (*) NEGATIVE Final   Comment:            The GeneXpert MRSA Assay (FDA     approved for NASAL specimens     only), is one component of a     comprehensive MRSA colonization  surveillance program. It is not     intended to diagnose MRSA     infection nor to guide or     monitor treatment for     MRSA infections.     RESULT CALLED TO, READ BACK BY AND VERIFIED WITH:     CALLED TO RN Verl Blalock [phone] @0350  THANEY     Performed at [facility]  CULTURE, BLOOD (ROUTINE X 2)     Status: None   Collection Time    11/08/12 10:24 PM      Result Value Range Status   Specimen Description BLOOD RIGHT ARM   Final   Special Requests     Final   Value: BOTTLES DRAWN AEROBIC AND ANAEROBIC 5CC BOTH BOTTLES   Culture  Setup Time     Final   Value: 11/09/2012 04:25     Performed at [facility]   Culture     Final   Value:        BLOOD CULTURE RECEIVED NO GROWTH TO DATE CULTURE WILL BE HELD FOR 5 DAYS BEFORE ISSUING A FINAL NEGATIVE REPORT     Performed at [facility]   Report Status PENDING   Incomplete  CULTURE, BLOOD (ROUTINE X 2)     Status: None   Collection Time    11/08/12 10:28 PM      Result Value Range Status   Specimen Description BLOOD RIGHT ARM   Final   Special Requests     Final   Value: BOTTLES DRAWN AEROBIC AND ANAEROBIC 5CC BOTH BOTTLES   Culture  Setup Time     Final   Value: 11/09/2012 04:25     Performed at [facility]   Culture     Final   Value:         BLOOD CULTURE RECEIVED NO GROWTH TO DATE CULTURE WILL BE HELD FOR 5 DAYS BEFORE ISSUING A FINAL NEGATIVE REPORT     Performed at [facility]   Report Status PENDING   Incomplete     Scheduled Meds: . ipratropium  0.5 mg Nebulization Q6H   And  . albuterol  2.5 mg Nebulization Q6H  . carvedilol  3.125 mg Oral BID WC  . Chlorhexidine Gluconate Cloth  6 each Topical Q0600  . clonazePAM  0.5 mg Oral BID  . enoxaparin (LOVENOX) injection  40 mg Subcutaneous Q24H  . levETIRAcetam  500 mg Intravenous Q12H  . lisinopril  20 mg Oral Daily  . loratadine  10 mg Oral Daily  . LORazepam  0-4 mg Intravenous Q6H   Followed by  . LORazepam  0-4 mg Intravenous Q12H  . LORazepam  0-4 mg Oral Q6H   Followed by  . LORazepam  0-4 mg Oral Q12H  . mupirocin ointment  1 application Nasal BID  . pantoprazole  40 mg Oral Q1200  . piperacillin-tazobactam (ZOSYN)  IV  3.375 g Intravenous Q8H  . potassium chloride  20 mEq Oral Once  . thiamine  100 mg Oral Daily   Or  . thiamine  100 mg Intravenous Daily  . vancomycin  1,250 mg Intravenous Q12H  . venlafaxine XR  150 mg Oral Daily   Continuous Infusions: . sodium chloride 0.9 % 1,000 mL with potassium chloride 20 mEq infusion 75 mL/hr at 11/10/12 1008     Madalen Gavin, DO  [facility] Pager [phone]  If 7PM-7AM, please contact night-coverage [email or web address] Password [email or web address] 11/10/2012, 12:18 PM   LOS: 2  days

## 2012-11-11 ENCOUNTER — Inpatient Hospital Stay (HOSPITAL_COMMUNITY): Payer: No Typology Code available for payment source

## 2012-11-11 DIAGNOSIS — R197 Diarrhea, unspecified: Secondary | ICD-10-CM

## 2012-11-11 DIAGNOSIS — R9431 Abnormal electrocardiogram [ECG] [EKG]: Secondary | ICD-10-CM

## 2012-11-11 DIAGNOSIS — F17200 Nicotine dependence, unspecified, uncomplicated: Secondary | ICD-10-CM

## 2012-11-11 DIAGNOSIS — F1020 Alcohol dependence, uncomplicated: Secondary | ICD-10-CM

## 2012-11-11 DIAGNOSIS — I5023 Acute on chronic systolic (congestive) heart failure: Secondary | ICD-10-CM | POA: Diagnosis present

## 2012-11-11 DIAGNOSIS — I4729 Other ventricular tachycardia: Secondary | ICD-10-CM

## 2012-11-11 DIAGNOSIS — I472 Ventricular tachycardia: Secondary | ICD-10-CM

## 2012-11-11 DIAGNOSIS — I10 Essential (primary) hypertension: Secondary | ICD-10-CM

## 2012-11-11 LAB — BASIC METABOLIC PANEL
BUN: 6 mg/dL (ref 6–23)
CO2: 24 mEq/L (ref 19–32)
Chloride: 97 mEq/L (ref 96–112)
Creatinine, Ser: 0.74 mg/dL (ref 0.50–1.35)
GFR calc Af Amer: >90 mL/min (ref >90)
Glucose, Bld: 92 mg/dL (ref 70–99)
Potassium: 3.4 mEq/L — ABNORMAL LOW (ref 3.5–5.1)

## 2012-11-11 LAB — CBC
HCT: 32.2 % — ABNORMAL LOW (ref 39.0–52.0)
Hemoglobin: 11.1 g/dL — ABNORMAL LOW (ref 13.0–17.0)
MCHC: 34.5 g/dL (ref 30.0–36.0)
MCV: 92.5 fL (ref 78.0–100.0)
RDW: 12.5 % (ref 11.5–15.5)

## 2012-11-11 LAB — LIPID PANEL
Cholesterol: 121 mg/dL (ref 0–200)
HDL: 48 mg/dL (ref >39)
LDL Cholesterol: 58 mg/dL (ref 0–99)
Total CHOL/HDL Ratio: 2.5 RATIO
Triglycerides: 73 mg/dL (ref <150)

## 2012-11-11 LAB — HEMOGLOBIN A1C: Hgb A1c MFr Bld: 5.8 % — ABNORMAL HIGH (ref <5.7)

## 2012-11-11 LAB — PRO B NATRIURETIC PEPTIDE: Pro B Natriuretic peptide (BNP): 8689 pg/mL — ABNORMAL HIGH (ref 0–125)

## 2012-11-11 MED ORDER — SPIRONOLACTONE 12.5 MG HALF TABLET
12.5000 mg | ORAL_TABLET | Freq: Every day | ORAL | Status: DC
  Administered 2012-11-11 – 2012-11-13 (×3): 12.5 mg via ORAL
  Filled 2012-11-11 (×3): qty 1

## 2012-11-11 MED ORDER — GUAIFENESIN ER 600 MG PO TB12
600.0000 mg | ORAL_TABLET | Freq: Two times a day (BID) | ORAL | Status: DC | PRN
  Administered 2012-11-11 – 2012-11-12 (×3): 600 mg via ORAL
  Filled 2012-11-11 (×2): qty 1

## 2012-11-11 MED ORDER — ALBUTEROL SULFATE (5 MG/ML) 0.5% IN NEBU
2.5000 mg | INHALATION_SOLUTION | Freq: Three times a day (TID) | RESPIRATORY_TRACT | Status: DC
  Administered 2012-11-11 – 2012-11-13 (×7): 2.5 mg via RESPIRATORY_TRACT
  Filled 2012-11-11 (×8): qty 0.5

## 2012-11-11 MED ORDER — ASPIRIN EC 81 MG PO TBEC
81.0000 mg | DELAYED_RELEASE_TABLET | Freq: Every day | ORAL | Status: DC
  Administered 2012-11-11 – 2012-11-13 (×3): 81 mg via ORAL
  Filled 2012-11-11 (×3): qty 1

## 2012-11-11 MED ORDER — IPRATROPIUM BROMIDE 0.02 % IN SOLN
0.5000 mg | Freq: Three times a day (TID) | RESPIRATORY_TRACT | Status: DC
  Administered 2012-11-11 – 2012-11-13 (×7): 0.5 mg via RESPIRATORY_TRACT
  Filled 2012-11-11 (×8): qty 2.5

## 2012-11-11 MED ORDER — POTASSIUM CHLORIDE CRYS ER 20 MEQ PO TBCR
40.0000 meq | EXTENDED_RELEASE_TABLET | Freq: Every day | ORAL | Status: DC
  Administered 2012-11-11 – 2012-11-13 (×3): 40 meq via ORAL
  Filled 2012-11-11 (×3): qty 2

## 2012-11-11 MED ORDER — MAGNESIUM SULFATE 40 MG/ML IJ SOLN
4.0000 g | Freq: Once | INTRAMUSCULAR | Status: AC
  Administered 2012-11-11: 4 g via INTRAVENOUS
  Filled 2012-11-11: qty 100

## 2012-11-11 MED ORDER — FUROSEMIDE 10 MG/ML IJ SOLN
40.0000 mg | Freq: Two times a day (BID) | INTRAMUSCULAR | Status: DC
  Administered 2012-11-11 – 2012-11-12 (×3): 40 mg via INTRAVENOUS
  Filled 2012-11-11 (×7): qty 4

## 2012-11-11 MED ORDER — FUROSEMIDE 10 MG/ML IJ SOLN
40.0000 mg | Freq: Once | INTRAMUSCULAR | Status: AC
  Administered 2012-11-11: 40 mg via INTRAVENOUS
  Filled 2012-11-11: qty 4

## 2012-11-11 MED ORDER — POTASSIUM CHLORIDE CRYS ER 20 MEQ PO TBCR
40.0000 meq | EXTENDED_RELEASE_TABLET | Freq: Once | ORAL | Status: AC
  Administered 2012-11-11: 40 meq via ORAL
  Filled 2012-11-11: qty 2

## 2012-11-11 MED ORDER — ATORVASTATIN CALCIUM 40 MG PO TABS
40.0000 mg | ORAL_TABLET | Freq: Every day | ORAL | Status: DC
  Administered 2012-11-11 – 2012-11-13 (×3): 40 mg via ORAL
  Filled 2012-11-11 (×3): qty 1

## 2012-11-11 MED ORDER — LEVETIRACETAM 500 MG PO TABS
500.0000 mg | ORAL_TABLET | Freq: Two times a day (BID) | ORAL | Status: DC
  Administered 2012-11-11 – 2012-11-13 (×4): 500 mg via ORAL
  Filled 2012-11-11 (×6): qty 1

## 2012-11-11 NOTE — Progress Notes (Signed)
[facility] PROGRESS NOTE  Juan [name]:[id number] DOB: 11/12/1950 DOA: 11/08/2012 PCP: Renold Don, MD  Brief history 62 year old male with a history of daily alcohol use for the past 15 years presented on 11/08/2012 after the wife noted him to have bilateral rhythmic jerking for about 30 seconds. He experienced a post ictal state. The patient had an alcohol withdrawal seizure approximately 2 years ago when he abstained from alcohol and his Klonopin. Neurology was consulted and recommended Keppra for one week. The patient had an episode of worsening encephalopathy during the hospitalization which was thought to be due to none witnessed seizure with postictal state. Workup has revealed aspiration pneumonia. He continues on Zosyn. His encephalopathy has improved.  He has become more SOB and wheezy, and has underlying chronic cough.    Assessment/Plan: Alcohol withdrawal seizure,  Patient drinks 4-5 beers per day times the last 15 years -Appreciate neurology consult -Continue Keppra for a one-week, day 3/7. -EEG--negative for epileptiform discharge -Continue CIWA  Acute encephalopathy, Secondary to seizure and resolved. -Check ammonia--36   -Troponins negative x3  -B1--746  -RBC folate 1458 -TSH--1.045 -PT eval pending  Aspiration pneumonia  -Continue Zosyn day 4 -Aerosolized albuterol and Atrovent  -Aspiration precautions   Increased SOB and wheezing without acute respiratory failure.  Given patient's history of CHF and that he has been on IVF consistently since admission, most likely he has acute on chronic systolic heart failure exacerbation.  DDx also includes chronic bronchitis, acute COPD exacerbation.   -  Continue duonebs  ICM with EF previously 40% and currently 30-35%  (see above) -  Start ASA -  Continue beta blocker, ACEI -  Start low dose spironolactone -  daily weights and strict I/O -  Pro-BNP -  CXR:  Interstitial edema and left pleural effusion -   D/C IVF -  Lasix 40mg  IV BID with daily potassium supplementation -  Troponins were negative at admission  -  Continue telemetry -  Cardiology consult.   Probable CAD -  Starting ASA and statin -  Continue BB -  Lipid panel and A1c  RBBB with LAFB and NSVT.  Noted that patient has new T-wave inversions and ST-segment depressions in V2-V5 compared to ECG in 2012.   -Patient having intermittent episodes of asymptomatic nonsustained Vtach, NSR over last 24 hours.   -Troponins negative  -Echocardiogram-- with EF 30-35%, akinesis of inferior myocardium and suggestion of elevated CVP given poorly compressible IVC -Check magnesium, replete potassium   Hypertension BP stable.  Continue carvedilol and lisinopril   Hyponatremia Appears to be chronic ranging 126-131 and may be related to beer potomania and CHF.  May also have some underlying alcoholic cirrhosis.    Hypokalemia and hypomagnesemia -Repleted with oral potassium and IV magnesium  Polysubstance abuse  -Tobacco and alcohol cessation discussed   Normocytic anemia and thrombocytopenia, may be related to chronic alcohol use  Intermittent diarrhea with several watery stools this morning.  DDx includes IBS, chronic pancreatitis -  C. Diff -  Enteric precautions  DIET:  Healthy heart (added 2gm sodium and 1.2L fluid restriction) ACCESS:  PIV IVF:  OFF Proph:  lovenox  Code:  full Family Communication: patient alone  Disposition Plan: Home pending diuresis   Procedures/Studies: Dg Chest 2 View  11/08/2012   CLINICAL DATA:  Shortness of breath  EXAM: CHEST  2 VIEW  COMPARISON:  10/25/2010  FINDINGS: The cardiac shadow is within normal limits. The lungs are well aerated bilaterally. Some mild increased density  is noted in the right upper lobe consistent with an acute infiltrate. No other focal abnormality is noted.  IMPRESSION: Right upper lobe infiltrate.   Electronically Signed   By: Alcide Clever M.D.   On: 11/08/2012 16:35    Ct Head Wo Contrast  11/08/2012   *RADIOLOGY REPORT*  Clinical Data: Witnessed seizure; wheezing, congestion and chest tightness.  CT HEAD WITHOUT CONTRAST  Technique:  Contiguous axial images were obtained from the base of the skull through the vertex without contrast.  Comparison: CT of the head performed 10/21/2010  Findings: There is no evidence of acute infarction, mass lesion, or intra- or extra-axial hemorrhage on CT.  Scattered periventricular matter change likely reflects small vessel ischemic microangiopathy.  The posterior fossa, including the cerebellum, brainstem and fourth ventricle, is within normal limits.  The third and lateral ventricles, and basal ganglia are unremarkable in appearance.  The cerebral hemispheres are symmetric in appearance, with normal gray- white differentiation.  No mass effect or midline shift is seen.  There is no evidence of fracture; visualized osseous structures are unremarkable in appearance.  The orbits are within normal limits. Mucosal thickening is noted within the maxillary sinuses bilaterally.  The remaining paranasal sinuses and mastoid air cells are well-aerated.  No significant soft tissue abnormalities are seen.  IMPRESSION:  1.  No acute intracranial pathology seen on CT. 2.  Mild small vessel ischemic microangiopathy. 3.  Mucosal thickening in the maxillary sinuses bilaterally.   Original Report Authenticated By: Tonia Ghent, M.D.       Subjective:  States he feels more SOB.  Cough is worse and he now feels wheezy.  Denies chest pain, palpitations.  Denies anxiety, shakes.  Feels fatigued and ill.    Objective: Filed Vitals:   11/10/12 2104 11/11/12 0519 11/11/12 0853 11/11/12 1009  BP: 143/95 164/97  154/87  Pulse: 82 85  88  Temp:  98.2 F (36.8 C)    TempSrc:  Oral    Resp:  24    Height:      Weight:      SpO2:  97% 96%     Intake/Output Summary (Last 24 hours) at 11/11/12 1332 Last data filed at 11/11/12 1058  Gross per 24  hour  Intake 1726.25 ml  Output   2575 ml  Net -848.75 ml   Weight change:  Exam:   General:  CM, Pt is alert, follows commands appropriately, not in acute distress  HEENT:  [place]/AT, MMM, no tongue fasciculations.  Kyphosis   Cardiovascular: Diminished heart sounds, regular rhythm.    Respiratory: bibasilar rales, diminished at bases, with full expiratory wheezing. No rhonchi.    Abdomen: Soft/+BS, non tender, non distended, no guarding  Extremities: 1+ bilateral edema, normal tone and bulk  Data Reviewed: Basic Metabolic Panel:  Recent Labs Lab 11/08/12 1638 11/08/12 2224 11/09/12 0330 11/09/12 1200 11/10/12 0325 11/11/12 0427  NA 128*  --  127* 129* 129* 131*  K 3.3*  --  3.4* 3.1* 3.4* 3.4*  CL 91*  --  94* 97 97 97  CO2  --   --  22 22 24 24   GLUCOSE 191*  --  124* 100* 101* 92  BUN 7  --  6 5* 7 6  CREATININE 1.00 0.62 0.65 0.72 0.87 0.74  CALCIUM  --   --  8.5 8.4 8.1* 7.9*  MG  --   --   --  1.5  --  1.4*  PHOS  --   --   --  3.7  --   --    Liver Function Tests:  Recent Labs Lab 11/09/12 0330 11/10/12 0325  AST 67* 47*  ALT 33 28  ALKPHOS 113 95  BILITOT 0.9 0.7  PROT 6.1 5.6*  ALBUMIN 2.9* 2.5*   No results found for this basename: LIPASE, AMYLASE,  in the last 168 hours  Recent Labs Lab 11/09/12 1200  AMMONIA 36   CBC:  Recent Labs Lab 11/08/12 1630 11/08/12 1638 11/10/12 0325 11/11/12 0427  WBC 8.7  --  8.3 8.8  NEUTROABS 7.0  --   --   --   HGB 14.8 16.3 10.8* 11.1*  HCT 41.0 48.0 31.2* 32.2*  MCV 90.1  --  92.9 92.5  PLT 204  --  140* 133*   Cardiac Enzymes:  Recent Labs Lab 11/08/12 2224 11/09/12 0330 11/09/12 0958  TROPONINI <0.30 <0.30 <0.30   BNP: No components found with this basename: POCBNP,  CBG:  Recent Labs Lab 11/08/12 1635  GLUCAP 180*    Recent Results (from the past 240 hour(s))  MRSA PCR SCREENING     Status: Abnormal   Collection Time    11/08/12  9:56 PM      Result Value Range Status    MRSA by PCR POSITIVE (*) NEGATIVE Final   Comment:            The GeneXpert MRSA Assay (FDA     approved for NASAL specimens     only), is one component of a     comprehensive MRSA colonization     surveillance program. It is not     intended to diagnose MRSA     infection nor to guide or     monitor treatment for     MRSA infections.     RESULT CALLED TO, READ BACK BY AND VERIFIED WITH:     CALLED TO RN Verl Blalock [phone] @0350  THANEY     Performed at [facility]  CULTURE, BLOOD (ROUTINE X 2)     Status: None   Collection Time    11/08/12 10:24 PM      Result Value Range Status   Specimen Description BLOOD RIGHT ARM   Final   Special Requests     Final   Value: BOTTLES DRAWN AEROBIC AND ANAEROBIC 5CC BOTH BOTTLES   Culture  Setup Time     Final   Value: 11/09/2012 04:25     Performed at [facility]   Culture     Final   Value:        BLOOD CULTURE RECEIVED NO GROWTH TO DATE CULTURE WILL BE HELD FOR 5 DAYS BEFORE ISSUING A FINAL NEGATIVE REPORT     Performed at [facility]   Report Status PENDING   Incomplete  CULTURE, BLOOD (ROUTINE X 2)     Status: None   Collection Time    11/08/12 10:28 PM      Result Value Range Status   Specimen Description BLOOD RIGHT ARM   Final   Special Requests     Final   Value: BOTTLES DRAWN AEROBIC AND ANAEROBIC 5CC BOTH BOTTLES   Culture  Setup Time     Final   Value: 11/09/2012 04:25     Performed at [facility]   Culture     Final   Value:        BLOOD CULTURE RECEIVED NO GROWTH TO DATE CULTURE WILL BE HELD FOR 5 DAYS BEFORE ISSUING  A FINAL NEGATIVE REPORT     Performed at [facility]   Report Status PENDING   Incomplete     Scheduled Meds: . ipratropium  0.5 mg Nebulization TID   And  . albuterol  2.5 mg Nebulization TID  . carvedilol  3.125 mg Oral BID WC  . Chlorhexidine Gluconate Cloth  6 each Topical Q0600  . clonazePAM  0.5 mg Oral BID  . enoxaparin (LOVENOX) injection  40 mg  Subcutaneous Q24H  . levETIRAcetam  500 mg Intravenous Q12H  . lisinopril  20 mg Oral Daily  . loratadine  10 mg Oral Daily  . LORazepam  0-4 mg Intravenous Q12H  . LORazepam  0-4 mg Oral Q12H  . mupirocin ointment  1 application Nasal BID  . pantoprazole  40 mg Oral Q1200  . piperacillin-tazobactam (ZOSYN)  IV  3.375 g Intravenous Q8H  . thiamine  100 mg Oral Daily   Or  . thiamine  100 mg Intravenous Daily  . venlafaxine XR  150 mg Oral Daily   Continuous Infusions:     Renae Fickle, MD  [facility] Pager [phone]  If 7PM-7AM, please contact night-coverage [email or web address] Password [email or web address] 11/11/2012, 1:32 PM   LOS: 3 days

## 2012-11-11 NOTE — Evaluation (Signed)
Physical Therapy Evaluation Patient Details Name: Juan [name] MRN: [id number] DOB: Jun 14, 1950 Today's Date: 11/11/2012 Time: 1338-1400 PT Time Calculation (min): 22 min  PT Assessment / Plan / Recommendation History of Present Illness  Juan [name] is a 62 y.o. male  admitted with seizure.  Pt with hx of ETOH and previous seizure  Clinical Impression  Pt is deconditioned, but he is able to walk a distance with supervision only.  He needs to continue to work on his posture and increasing ambulation at home.  If he does not improve, recommend outpatient PT    PT Assessment  Patent does not need any further PT services    Follow Up Recommendations  No PT follow up    Does the patient have the potential to tolerate intense rehabilitation      Barriers to Discharge        Equipment Recommendations  None recommended by PT    Recommendations for Other Services     Frequency      Precautions / Restrictions Precautions Precautions: Fall Restrictions Weight Bearing Restrictions: No   Pertinent Vitals/Pain No c/o pain      Mobility  Bed Mobility Bed Mobility: Supine to Sit Supine to Sit: 6: Modified independent (Device/Increase time);With rails Details for Bed Mobility Assistance: needs extra time Transfers Transfers: Sit to Stand;Stand to Sit Sit to Stand: 5: Supervision Stand to Sit: 5: Supervision Ambulation/Gait Ambulation/Gait Assistance: 5: Supervision Ambulation Distance (Feet): 400 Feet Assistive device: None Ambulation/Gait Assistance Details: cues to extend upper trunk and swing arms for balance Gait Pattern: Decreased step length - right;Decreased step length - left;Trunk flexed Gait velocity: decreased General Gait Details: Pt has kyphosis and forward head with decreased control of arms during gait.  Pt was able to improve posture with effort for a short while Stairs: No Wheelchair Mobility Wheelchair Mobility: No    Exercises Other  Exercises Other Exercises: pt encouraged to increase activity level at home  Other Exercises: encouraged to increase to walking 30 minutes a day with emphasis on posture Other Exercises: recommended eventual outpatient PT if pt does not improve on his own in the next few weeks   PT Diagnosis:    PT Problem List:   PT Treatment Interventions:       PT Goals(Current goals can be found in the care plan section)    Visit Information  Last PT Received On: 11/11/12 Assistance Needed: +2 History of Present Illness: Juan [name] is a 62 y.o. male  admitted with seizure.  Pt with hx of ETOH and previous seizure       Prior Functioning  Home Living Family/patient expects to be discharged to:: Private residence Living Arrangements: Spouse/significant other Available Help at Discharge: Family Type of Home: House Home Access: Stairs to enter [employer] of Steps: 2 Entrance Stairs-Rails: Right Home Layout: Two level;Able to live on main level with bedroom/bathroom Home Equipment: [place] - single point Prior Function Level of Independence: Independent Communication Communication: No difficulties    Cognition  Cognition Arousal/Alertness: Awake/alert Behavior During Therapy: WFL for tasks assessed/performed Overall Cognitive Status: Within Functional Limits for tasks assessed    Extremity/Trunk Assessment Lower Extremity Assessment Lower Extremity Assessment: Generalized weakness Cervical / Trunk Assessment Cervical / Trunk Assessment: Kyphotic;Other exceptions Cervical / Trunk Exceptions: forward head.  Pt states he has had kyphosis for a while.  He is able to extend upper trunk somewhat with effort, but is having trouble maintaining it   Balance Balance Balance Assessed:  Yes Static Sitting Balance Static Sitting - Balance Support: No upper extremity supported;Feet supported Static Sitting - Level of Assistance: 7: Independent Static Standing Balance Static  Standing - Balance Support: No upper extremity supported Static Standing - Level of Assistance: 7: Independent Static Standing - Comment/# of Minutes: pt reports feeling weak today because of episodes of diarrhea this morning.   End of Session PT - End of Session Activity Tolerance: Patient tolerated treatment well Patient left: in chair;with family/visitor present  GP    Rosey Bath K. Manson Passey, [place] [phone] 11/11/2012, 2:42 PM

## 2012-11-11 NOTE — Consult Note (Signed)
CARDIOLOGY CONSULTATION NOTE   Reason for Consult: CHF, VT  Requesting Physician: Short  Cardiologist: None  HPI: This is a 62 y.o. male with a past medical history significant for congestive heart failure with presumed alcohol induced dilated cardiomyopathy diagnosed in 2012. He is now admitted with alcohol withdrawal complicated by seizures and has had nonsustained ventricular tachycardia on telemetry.   The presentation is similar to September of 2012. At that time he probably had benzodiazepine withdrawal. He was noted to have cardiomegaly on chest x-ray. An echo showed an ejection fraction of 40% and grade 2 diastolic dysfunction. He received furosemide and lisinopril and carvedilol which he has continued to take over the years. As far as I can tell he has not followed up with a cardiologist. His discharge summary from 2012 listed an intention to perform a stress test, but I do not find a record of one ever  performed. He has never complained of angina. He has been a heavy consumer of alcohol for at least 20 years. He drinks half a gallon of hard liquor a week.  It is unclear whether he has systemic hypertension. He was hypertensive during his last episode of alcohol withdrawal and was subsequent started on ACE inhibitors and beta blockers which she has taken ever since. His blood pressure has been normal. He does not have diabetes. He has a very long-standing history of smoking. He has several family members with coronary disease but developed at advanced age, rather than prematurely.  He has never had angina pectoris. He denies syncope (other than episodes associated with alcohol intoxication or withdrawal related seizures). He denies palpitations. He had edema during his hospitalization 2012 but this has not been a problem recently.  He is very sedentary but appears to have good functional status prior to this hospitalization. He has multiple obvious symptoms of depression.  He has  an umbilical hernia and has been recommended surgery, but has yet to set up a "cardiology clearance" appointment.   PMHx:  Past Medical History  Diagnosis Date  . CHF (congestive heart failure) 10/2010    ECHO:  EF 40%, Grade II diastolic dysfunction  . Alcoholism   . Allergy     seasonal   . Anxiety   . Depression   . Asthma   . Hyperlipidemia   . Hypertension   . Seizures    Past Surgical History  Procedure Laterality Date  . Removal of ingrown toenail      FAMHx: Family History  Problem Relation Age of Onset  . Alzheimer's disease Mother   . Diabetes Mother   . Heart disease Maternal Uncle     SOCHx:  reports that he has been smoking Cigarettes.  He has a 40 pack-year smoking history. He does not have any smokeless tobacco history on file. He reports that he drinks alcohol. He reports that he does not use illicit drugs.  ALLERGIES: No Known Allergies  ROS: The patient specifically denies any chest pain at rest or with exertion, dyspnea at rest or with exertion, orthopnea, paroxysmal nocturnal dyspnea, syncope, palpitations, focal neurological deficits, intermittent claudication, lower extremity edema, unexplained weight gain, cough, hemoptysis or wheezing.  The patient also denies abdominal pain, nausea, vomiting, dysphagia, diarrhea, constipation, polyuria, polydipsia, dysuria, hematuria, frequency, urgency, abnormal bleeding or bruising, fever, chills, unexpected weight changes,change in skin or hair texture, change in voice quality, auditory or visual problems, allergic reactions or rashes, new musculoskeletal complaints other than usual "aches and pains".   HOME MEDICATIONS:  Prescriptions prior to admission  Medication Sig Dispense Refill  . carvedilol (COREG) 3.125 MG tablet Take 1 tablet (3.125 mg total) by mouth 2 (two) times daily with a meal.  90 tablet  1  . clonazePAM (KLONOPIN) 0.5 MG tablet Take 1 tablet (0.5 mg total) by mouth 2 (two) times daily as  needed for anxiety.  60 tablet  1  . desloratadine (CLARINEX) 5 MG tablet Take 1 tablet (5 mg total) by mouth daily.  30 tablet  2  . fluticasone (FLONASE) 50 MCG/ACT nasal spray Place 2 sprays into the nose daily.      Marland Kitchen ipratropium (ATROVENT HFA) 17 MCG/ACT inhaler Inhale 2 puffs into the lungs 3 (three) times daily as needed for wheezing.  1 Inhaler  3  . lisinopril (PRINIVIL,ZESTRIL) 10 MG tablet Take 20 mg by mouth daily.      Marland Kitchen venlafaxine XR (EFFEXOR-XR) 150 MG 24 hr capsule Take 150 mg by mouth daily.        HOSPITAL MEDICATIONS: I have reviewed the patient's current medications.  VITALS: Blood pressure 144/90, pulse 83, temperature 97.4 F (36.3 C), temperature source Axillary, resp. rate 20, height 5\' 8"  (1.727 m), weight 186 lb 15.2 oz (84.8 kg), SpO2 97.00%.  PHYSICAL EXAM:  General: Alert, oriented x3, no distress Head: no evidence of trauma, PERRL, EOMI, no exophtalmos or lid lag, no myxedema, no xanthelasma; normal ears, nose and oropharynx Neck: normal jugular venous pulsations and no hepatojugular reflux; brisk carotid pulses without delay and no carotid bruits Chest: clear to auscultation, no signs of consolidation by percussion or palpation, normal fremitus, symmetrical and full respiratory excursions Cardiovascular: Inferolaterally displaced and heaving apical impulse, regular rhythm, normal first and second heart sounds, no murmurs, rubs or gallops Abdomen: no tenderness or distention, no masses by palpation, no abnormal pulsatility or arterial bruits, normal bowel sounds, no hepatosplenomegaly Extremities: no clubbing, cyanosis or edema; 2+ radial, ulnar and brachial pulses bilaterally; 2+ right femoral, posterior tibial and dorsalis pedis pulses; 2+ left femoral, posterior tibial and dorsalis pedis pulses; no subclavian or femoral bruits Neurological: grossly nonfocal   LABS: Results for orders placed during the hospital encounter of 11/08/12 (from the past 48  hour(s))  CBC     Status: Abnormal   Collection Time    11/10/12  3:25 AM      Result Value Range   WBC 8.3  4.0 - 10.5 K/uL   RBC 3.36 (*) 4.22 - 5.81 MIL/uL   Hemoglobin 10.8 (*) 13.0 - 17.0 g/dL   Comment: RESULT REPEATED AND VERIFIED     DELTA CHECK NOTED   HCT 31.2 (*) 39.0 - 52.0 %   MCV 92.9  78.0 - 100.0 fL   MCH 32.1  26.0 - 34.0 pg   MCHC 34.6  30.0 - 36.0 g/dL   RDW 16.1  09.6 - 04.5 %   Platelets 140 (*) 150 - 400 K/uL   Comment: RESULT REPEATED AND VERIFIED     DELTA CHECK NOTED  COMPREHENSIVE METABOLIC PANEL     Status: Abnormal   Collection Time    11/10/12  3:25 AM      Result Value Range   Sodium 129 (*) 135 - 145 mEq/L   Potassium 3.4 (*) 3.5 - 5.1 mEq/L   Chloride 97  96 - 112 mEq/L   CO2 24  19 - 32 mEq/L   Glucose, Bld 101 (*) 70 - 99 mg/dL   BUN 7  6 - 23 mg/dL  Creatinine, Ser 0.87  0.50 - 1.35 mg/dL   Calcium 8.1 (*) 8.4 - 10.5 mg/dL   Total Protein 5.6 (*) 6.0 - 8.3 g/dL   Albumin 2.5 (*) 3.5 - 5.2 g/dL   AST 47 (*) 0 - 37 U/L   ALT 28  0 - 53 U/L   Alkaline Phosphatase 95  39 - 117 U/L   Total Bilirubin 0.7  0.3 - 1.2 mg/dL   GFR calc non Af Amer >90  >90 mL/min   GFR calc Af Amer >90  >90 mL/min   Comment: (NOTE)     The eGFR has been calculated using the CKD EPI equation.     This calculation has not been validated in all clinical situations.     eGFR's persistently <90 mL/min signify possible Chronic Kidney     Disease.  BASIC METABOLIC PANEL     Status: Abnormal   Collection Time    11/11/12  4:27 AM      Result Value Range   Sodium 131 (*) 135 - 145 mEq/L   Potassium 3.4 (*) 3.5 - 5.1 mEq/L   Chloride 97  96 - 112 mEq/L   CO2 24  19 - 32 mEq/L   Glucose, Bld 92  70 - 99 mg/dL   BUN 6  6 - 23 mg/dL   Creatinine, Ser 1.61  0.50 - 1.35 mg/dL   Calcium 7.9 (*) 8.4 - 10.5 mg/dL   GFR calc non Af Amer >90  >90 mL/min   GFR calc Af Amer >90  >90 mL/min   Comment: (NOTE)     The eGFR has been calculated using the CKD EPI equation.      This calculation has not been validated in all clinical situations.     eGFR's persistently <90 mL/min signify possible Chronic Kidney     Disease.  CBC     Status: Abnormal   Collection Time    11/11/12  4:27 AM      Result Value Range   WBC 8.8  4.0 - 10.5 K/uL   RBC 3.48 (*) 4.22 - 5.81 MIL/uL   Hemoglobin 11.1 (*) 13.0 - 17.0 g/dL   HCT 09.6 (*) 04.5 - 40.9 %   MCV 92.5  78.0 - 100.0 fL   MCH 31.9  26.0 - 34.0 pg   MCHC 34.5  30.0 - 36.0 g/dL   RDW 81.1  91.4 - 78.2 %   Platelets 133 (*) 150 - 400 K/uL  MAGNESIUM     Status: Abnormal   Collection Time    11/11/12  4:27 AM      Result Value Range   Magnesium 1.4 (*) 1.5 - 2.5 mg/dL  PRO B NATRIURETIC PEPTIDE     Status: Abnormal   Collection Time    11/11/12  4:27 AM      Result Value Range   Pro B Natriuretic peptide (BNP) 8689.0 (*) 0 - 125 pg/mL    IMAGING: Dg Chest Port 1 View  11/11/2012   CLINICAL DATA:  Cough, fever, shortness of breath and chest pain.  EXAM: PORTABLE CHEST - 1 VIEW  COMPARISON:  11/08/2012.  FINDINGS: Trachea is midline. Heart size is within normal limits. Worsening right perihilar airspace consolidation. Mild interstitial prominence at the lung bases, left greater than right. Small left pleural effusion.  IMPRESSION: 1. Worsening right upper lobe airspace disease, worrisome for pneumonia. Followup to clearing is recommended. 2. Possible early pneumonia in the lung bases, versus atelectasis. Continued attention  on followup exams is warranted. 3. Small left pleural effusion.   Electronically Signed   By: Leanna Battles M.D.   On: 11/11/2012 14:45   ECG shows pre-existing right bundle branch block left anterior fascicular block; during an episode of tachycardia that was worsening of chronic repolarization abnormalities, but the appearance was not specific for acute ischemia.  Echocardiogram reviewed: describes global hypokinesis of left ventricle with an ejection fraction of 30-35% and inferobasal  akinesis. Not sure I agree with the wall motion analysis and really do not see a regional pattern.  IMPRESSION: 1. Dilated cardiomyopathy, most likely related to alcohol abuse. Currently hemodynamically compensated 2. We cannot reliably exclude ischemic heart disease and I strongly recommend that he undergo a perfusion study (Lexiscan Myoview) 3. Lifelong alcohol abstinence is indicated 4. Continue treatment with ACE inhibitors and beta blockers; try to gradually increase the doses as tolerated by his blood pressure 5. Currently appears to be euvolemic. I do not see an indication for loop diuretic at this time. 6. Borderline left ventricular ejection fraction for ICD indication. Reevaluate ejection fraction after 3 months of alcohol abstinence and optimize medical therapy. 7. Nonsustained ventricular tachycardia is common in the setting of alcohol withdrawal and does not necessarily indicate long-term risk of sustained arrhythmia 8. Long-standing bifascicular block noted on electrocardiogram without symptoms to suggest high-grade atrioventricular block. The presence of the conduction abnormality might signify a more pervasive degree of cardiac muscle injury. His left ventricular ejection fraction may not normalize even if he completely abstains from alcohol.   Time Spent Directly with Patient: 60 minutes  Thurmon Fair, MD, [facility] [phone] office [phone] pager  11/11/2012, 5:11 PM

## 2012-11-12 DIAGNOSIS — I452 Bifascicular block: Secondary | ICD-10-CM

## 2012-11-12 DIAGNOSIS — I5040 Unspecified combined systolic (congestive) and diastolic (congestive) heart failure: Secondary | ICD-10-CM

## 2012-11-12 LAB — BASIC METABOLIC PANEL
BUN: 8 mg/dL (ref 6–23)
Calcium: 8.5 mg/dL (ref 8.4–10.5)
GFR calc non Af Amer: >90 mL/min (ref >90)
Glucose, Bld: 106 mg/dL — ABNORMAL HIGH (ref 70–99)
Sodium: 127 mEq/L — ABNORMAL LOW (ref 135–145)

## 2012-11-12 MED ORDER — REGADENOSON 0.4 MG/5ML IV SOLN
0.4000 mg | Freq: Once | INTRAVENOUS | Status: AC
  Filled 2012-11-12: qty 5

## 2012-11-12 MED ORDER — POTASSIUM CHLORIDE CRYS ER 20 MEQ PO TBCR
40.0000 meq | EXTENDED_RELEASE_TABLET | Freq: Once | ORAL | Status: AC
  Administered 2012-11-12: 40 meq via ORAL
  Filled 2012-11-12: qty 2

## 2012-11-12 MED ORDER — LOPERAMIDE HCL 2 MG PO CAPS: 2.0000 mg | ORAL_CAPSULE | ORAL | Status: DC | PRN

## 2012-11-12 MED ORDER — IBUPROFEN 800 MG PO TABS
400.0000 mg | ORAL_TABLET | Freq: Four times a day (QID) | ORAL | Status: AC | PRN
  Administered 2012-11-12 – 2012-11-13 (×2): 400 mg via ORAL
  Filled 2012-11-12 (×2): qty 1

## 2012-11-12 MED ORDER — MOMETASONE FURO-FORMOTEROL FUM 100-5 MCG/ACT IN AERO
2.0000 | INHALATION_SPRAY | Freq: Two times a day (BID) | RESPIRATORY_TRACT | Status: DC
  Administered 2012-11-12 – 2012-11-13 (×2): 2 via RESPIRATORY_TRACT
  Filled 2012-11-12: qty 8.8

## 2012-11-12 MED ORDER — CARVEDILOL 6.25 MG PO TABS
6.2500 mg | ORAL_TABLET | Freq: Two times a day (BID) | ORAL | Status: DC
  Administered 2012-11-12 – 2012-11-13 (×3): 6.25 mg via ORAL
  Filled 2012-11-12 (×4): qty 1

## 2012-11-12 NOTE — Progress Notes (Signed)
C-Diff came back negative. D/C'd Enteric precautions.

## 2012-11-12 NOTE — Progress Notes (Signed)
Subjective: He reports feeling better.  He did have some burning CP at one point.  His abd hurts which he attributes to MS pain from coughing.  No SOB.  Sleeps with the bed elevated.    Objective: Vital signs in last 24 hours: Temp:  [97.4 F (36.3 C)-98.8 F (37.1 C)] 98.8 F (37.1 C) (10/09 0524) Pulse Rate:  [79-91] 91 (10/09 0524) Resp:  [20] 20 (10/09 0524) BP: (132-163)/(83-100) 152/94 mmHg (10/09 0636) SpO2:  [95 %-98 %] 96 % (10/09 0524) Weight:  [178 lb 12.7 oz (81.1 kg)] 178 lb 12.7 oz (81.1 kg) (10/09 0524) Last BM Date: 11/11/12  Intake/Output from previous day: 10/08 0701 - 10/09 0700 In: 990 [P.O.:840; IV Piggyback:150] Out: 1600 [Urine:1600] Intake/Output this shift:    Medications Current Facility-Administered Medications  Medication Dose Route Frequency Provider Last Rate Last Dose  . acetaminophen (TYLENOL) tablet 650 mg  650 mg Oral Q6H PRN Catarina Hartshorn, MD   650 mg at 11/12/12 0257  . ipratropium (ATROVENT) nebulizer solution 0.5 mg  0.5 mg Nebulization TID Renae Fickle, MD   0.5 mg at 11/11/12 2025   And  . albuterol (PROVENTIL) (5 MG/ML) 0.5% nebulizer solution 2.5 mg  2.5 mg Nebulization TID Renae Fickle, MD   2.5 mg at 11/11/12 2025  . aspirin EC tablet 81 mg  81 mg Oral Daily Renae Fickle, MD   81 mg at 11/11/12 1508  . atorvastatin (LIPITOR) tablet 40 mg  40 mg Oral q1800 Renae Fickle, MD   40 mg at 11/11/12 1613  . carvedilol (COREG) tablet 3.125 mg  3.125 mg Oral BID WC Therisa Doyne, MD   3.125 mg at 11/11/12 1613  . Chlorhexidine Gluconate Cloth 2 % PADS 6 each  6 each Topical Q0600 Catarina Hartshorn, MD   6 each at 11/11/12 1008  . clonazePAM (KLONOPIN) tablet 0.5 mg  0.5 mg Oral BID Therisa Doyne, MD   0.5 mg at 11/11/12 2147  . furosemide (LASIX) injection 40 mg  40 mg Intravenous BID Renae Fickle, MD   40 mg at 11/11/12 1613  . guaiFENesin (MUCINEX) 12 hr tablet 600 mg  600 mg Oral BID PRN Leda Gauze, NP   600 mg at  11/11/12 2147  . levalbuterol (XOPENEX) nebulizer solution 0.63 mg  0.63 mg Nebulization Q4H PRN Therisa Doyne, MD      . levETIRAcetam (KEPPRA) tablet 500 mg  500 mg Oral BID Renae Fickle, MD   500 mg at 11/11/12 2147  . lisinopril (PRINIVIL,ZESTRIL) tablet 20 mg  20 mg Oral Daily Therisa Doyne, MD   20 mg at 11/11/12 1008  . loratadine (CLARITIN) tablet 10 mg  10 mg Oral Daily Therisa Doyne, MD   10 mg at 11/11/12 1007  . LORazepam (ATIVAN) injection 0-4 mg  0-4 mg Intravenous Q12H Mora Bellman, PA-C   1 mg at 11/12/12 0535  . LORazepam (ATIVAN) tablet 0-4 mg  0-4 mg Oral Q12H Mora Bellman, PA-C      . menthol-cetylpyridinium (CEPACOL) lozenge 3 mg  1 lozenge Oral PRN Leda Gauze, NP   3 mg at 11/10/12 2119  . mupirocin ointment (BACTROBAN) 2 % 1 application  1 application Nasal BID Catarina Hartshorn, MD   1 application at 11/11/12 2145  . pantoprazole (PROTONIX) EC tablet 40 mg  40 mg Oral Q1200 Therisa Doyne, MD   40 mg at 11/11/12 1129  . piperacillin-tazobactam (ZOSYN) IVPB 3.375 g  3.375 g Intravenous Q8H Anastassia  Doutova, MD   3.375 g at 11/12/12 0536  . potassium chloride SA (K-DUR,KLOR-CON) CR tablet 40 mEq  40 mEq Oral Daily Renae Fickle, MD   40 mEq at 11/11/12 1508  . spironolactone (ALDACTONE) tablet 12.5 mg  12.5 mg Oral Daily Renae Fickle, MD   12.5 mg at 11/11/12 1508  . thiamine (VITAMIN B-1) tablet 100 mg  100 mg Oral Daily Mora Bellman, PA-C   100 mg at 11/11/12 1007   Or  . thiamine (B-1) injection 100 mg  100 mg Intravenous Daily Mora Bellman, PA-C   100 mg at 11/09/12 0943  . venlafaxine XR (EFFEXOR-XR) 24 hr capsule 150 mg  150 mg Oral Daily Therisa Doyne, MD   150 mg at 11/11/12 1008    PE: General appearance: alert, cooperative and no distress Neck: +JVD Lungs: Decreased BS particularly on the right.  Basilar rales and diffuse wheeze. Heart: regular rate and rhythm, S1, S2 normal, no murmur, click, rub or  gallop Abdomen: Nontender, +BS, appears mildly distended. Extremities: No LEE Pulses: 2+ and symmetric Neurologic: Grossly normal  Lab Results:   Recent Labs  11/10/12 0325 11/11/12 0427  WBC 8.3 8.8  HGB 10.8* 11.1*  HCT 31.2* 32.2*  PLT 140* 133*   BMET  Recent Labs  11/09/12 1200 11/10/12 0325 11/11/12 0427  NA 129* 129* 131*  K 3.1* 3.4* 3.4*  CL 97 97 97  CO2 22 24 24   GLUCOSE 100* 101* 92  BUN 5* 7 6  CREATININE 0.72 0.87 0.74  CALCIUM 8.4 8.1* 7.9*     Recent Labs  11/11/12 1528  CHOL 121   Lipid Panel     Component Value Date/Time   CHOL 121 11/11/2012 1528   TRIG 73 11/11/2012 1528   HDL 48 11/11/2012 1528   CHOLHDL 2.5 11/11/2012 1528   VLDL 15 11/11/2012 1528   LDLCALC 58 11/11/2012 1528    Cardiac Panel (last 3 results)  Recent Labs  11/09/12 0958  TROPONINI <0.30     Assessment/Plan  Principal Problem:   Acute on chronic systolic heart failure Active Problems:   Alcoholism   HTN (hypertension)   Hyponatremia   Aspiration pneumonia   Hypocalcemia   Abnormal ECG   Seizure due to alcohol withdrawal   RBBB   Alcohol dependence   Diarrhea  Plan: Continues to have NSVT.  I increased coreg to 6.25bid.  Myoview when ready and prior to discharge.  New echo shows EF decreased to 30-35%.  Given this new finding, and repeated NSVT, a Lifevest would be appropriate at discharge However, I wonder wether or not he can be compliant.   BNP elevated at 8689.0.  Currently on 40mg IV lasix BID.  Net fluids: -0.61L/+4.9L.  I would continue today and recheck BNP in AM.  BMET pending.  Will probably need more K+ today.  Lipid panel looks great but CAD is still a possibility given ETOH abuse.     LOS: 4 days    HAGER, BRYAN 11/12/2012 8:02 AM   Agree with note written by Jones Skene PAC  Sever LV dysfunction presumably NISCM secondary to ETOH though can't  R/O CAD. On appropriate meds. 10 beats of NSVT last PM. Repleting K and Mg. Agree with  Steffanie Dunn to R/O CAD. Will order for tomorrow at [facility]. Will prob need a LifeVest prior to D/C. Titrate BB.  Nanetta Batty J 11/12/2012 4:00 PM

## 2012-11-12 NOTE — Progress Notes (Signed)
[facility] PROGRESS NOTE  TYSHAUN VINZANT HYQ:[id number] DOB: [birth year] DOA: 11/08/2012 PCP: Renold Don, MD  Brief history 62 year old male with a history of daily alcohol use for the past 15 years presented on 11/08/2012 after the wife noted him to have bilateral rhythmic jerking for about 30 seconds. He experienced a post ictal state. The patient had an alcohol withdrawal seizure approximately 2 years ago when he abstained from alcohol and his Klonopin. Neurology was consulted and recommended Keppra for one week. The patient had an episode of worsening encephalopathy during the hospitalization which was thought to be due to none witnessed seizure with postictal state. Workup has revealed aspiration pneumonia. He continues on Zosyn. His encephalopathy has improved.  He has become more SOB and wheezy, and has underlying chronic cough.    Assessment/Plan:  Alcohol withdrawal seizure,  Patient drinks 4-5 beers per day times the last 15 years.   -Appreciate neurology consult -Continue Keppra for a one-week, day 4/7. -EEG--negative for epileptiform discharge -Continue CIWA - d/c clonazepam  Acute encephalopathy, Secondary to seizure and resolved.  Ammonia 36, troponins negative x3, B1 746, RBC folate 1458, and TSH 1.045.    Aspiration pneumonia  -Continue Zosyn day 5 -Aerosolized albuterol and Atrovent  -Aspiration precautions   Increased SOB and wheezing without acute respiratory failure.  Likely acute on chronic systolic heart failure exacerbation superimposed on chronic COPD.    Chronic COPD -  Continue duonebs -  Start symbicort  ICM with EF previously 40% and currently 30-35%  (see above) -  Continue ASA, beta blocker, ACEI, spironolactone -  daily weights and strict I/O, -  -  Pro-BNP 8689 -  CXR:  Interstitial edema and left pleural effusion -  Lasix 40mg  IV BID with daily potassium supplementation -  Troponins were negative at admission  -  Continue telemetry:   NSVT and PVC.   -  Cardiology recommending inpatient stress test, further diuresis, and possible life vest   Probable CAD given akinesis of inferior myocardium  -  Continue ASA, statin, and BB -  Lipid panel demonstrates adequate cholesterol control and A1c 5.8  RBBB with LAFB and NSVT.   -  Possible live vest at discharge  Hypertension BP stable.  Continue carvedilol and lisinopril   Hyponatremia Appears to be chronic ranging 126-131 and may be related to beer potomania and CHF.  May also have some underlying alcoholic cirrhosis.    Hypokalemia and hypomagnesemia -Repleted with oral potassium and IV magnesium  Polysubstance abuse  -Tobacco and alcohol cessation discussed   Normocytic anemia and thrombocytopenia, may be related to chronic alcohol use  Intermittent diarrhea with several watery stools this morning.  DDx includes IBS, chronic pancreatitis -  C. Diff neg  DIET:  Healthy heart (added 2gm sodium and 1.2L fluid restriction) ACCESS:  PIV IVF:  OFF Proph:  lovenox  Code:  full Family Communication: patient alone  Disposition Plan: Home pending diuresis   Procedures/Studies: Dg Chest 2 View  11/08/2012   CLINICAL DATA:  Shortness of breath  EXAM: CHEST  2 VIEW  COMPARISON:  10/25/2010  FINDINGS: The cardiac shadow is within normal limits. The lungs are well aerated bilaterally. Some mild increased density is noted in the right upper lobe consistent with an acute infiltrate. No other focal abnormality is noted.  IMPRESSION: Right upper lobe infiltrate.   Electronically Signed   By: Alcide Clever M.D.   On: 11/08/2012 16:35   Ct Head Wo Contrast  11/08/2012   *RADIOLOGY  REPORT*  Clinical Data: Witnessed seizure; wheezing, congestion and chest tightness.  CT HEAD WITHOUT CONTRAST  Technique:  Contiguous axial images were obtained from the base of the skull through the vertex without contrast.  Comparison: CT of the head performed 10/21/2010  Findings: There is no evidence  of acute infarction, mass lesion, or intra- or extra-axial hemorrhage on CT.  Scattered periventricular matter change likely reflects small vessel ischemic microangiopathy.  The posterior fossa, including the cerebellum, brainstem and fourth ventricle, is within normal limits.  The third and lateral ventricles, and basal ganglia are unremarkable in appearance.  The cerebral hemispheres are symmetric in appearance, with normal gray- white differentiation.  No mass effect or midline shift is seen.  There is no evidence of fracture; visualized osseous structures are unremarkable in appearance.  The orbits are within normal limits. Mucosal thickening is noted within the maxillary sinuses bilaterally.  The remaining paranasal sinuses and mastoid air cells are well-aerated.  No significant soft tissue abnormalities are seen.  IMPRESSION:  1.  No acute intracranial pathology seen on CT. 2.  Mild small vessel ischemic microangiopathy. 3.  Mucosal thickening in the maxillary sinuses bilaterally.   Original Report Authenticated By: Tonia Ghent, M.D.       Subjective:  States he feels less SOB.  Had about 10 episodes of diarrhea yesterday.  Denies chest pain, palpitations.  Denies anxiety, shakes.  Feels fatigued and ill.    Objective: Filed Vitals:   11/11/12 2120 11/12/12 0524 11/12/12 0636 11/12/12 0909  BP: 132/83 163/100 152/94   Pulse: 79 91    Temp: 98.3 F (36.8 C) 98.8 F (37.1 C)    TempSrc: Oral Oral    Resp: 20 20    Height:      Weight:  81.1 kg (178 lb 12.7 oz)    SpO2: 96% 96%  98%    Intake/Output Summary (Last 24 hours) at 11/12/12 1342 Last data filed at 11/12/12 0600  Gross per 24 hour  Intake    510 ml  Output    400 ml  Net    110 ml   Weight change:  Exam:   General:  CM, Pt is alert, follows commands appropriately, not in acute distress  HEENT:  [place]/AT, MMM, no tongue fasciculations.  Kyphosis   Cardiovascular: Diminished heart sounds, regular rhythm.     Respiratory: bibasilar rales, diminished throughout with full expiratory wheezing. No rhonchi.    Abdomen: Soft/+BS, non tender, non distended, no guarding  Extremities: 1+ bilateral edema, normal tone and bulk  Neuro:  No tremor  Data Reviewed: Basic Metabolic Panel:  Recent Labs Lab 11/09/12 0330 11/09/12 1200 11/10/12 0325 11/11/12 0427 11/12/12 0820  NA 127* 129* 129* 131* 127*  K 3.4* 3.1* 3.4* 3.4* 3.4*  CL 94* 97 97 97 90*  CO2 22 22 24 24 28   GLUCOSE 124* 100* 101* 92 106*  BUN 6 5* 7 6 8   CREATININE 0.65 0.72 0.87 0.74 0.80  CALCIUM 8.5 8.4 8.1* 7.9* 8.5  MG  --  1.5  --  1.4*  --   PHOS  --  3.7  --   --   --    Liver Function Tests:  Recent Labs Lab 11/09/12 0330 11/10/12 0325  AST 67* 47*  ALT 33 28  ALKPHOS 113 95  BILITOT 0.9 0.7  PROT 6.1 5.6*  ALBUMIN 2.9* 2.5*   No results found for this basename: LIPASE, AMYLASE,  in the last 168 hours  Recent  Labs Lab 11/09/12 1200  AMMONIA 36   CBC:  Recent Labs Lab 11/08/12 1630 11/08/12 1638 11/10/12 0325 11/11/12 0427  WBC 8.7  --  8.3 8.8  NEUTROABS 7.0  --   --   --   HGB 14.8 16.3 10.8* 11.1*  HCT 41.0 48.0 31.2* 32.2*  MCV 90.1  --  92.9 92.5  PLT 204  --  140* 133*   Cardiac Enzymes:  Recent Labs Lab 11/08/12 2224 11/09/12 0330 11/09/12 0958  TROPONINI <0.30 <0.30 <0.30   BNP: No components found with this basename: POCBNP,  CBG:  Recent Labs Lab 11/08/12 1635  GLUCAP 180*    Recent Results (from the past 240 hour(s))  MRSA PCR SCREENING     Status: Abnormal   Collection Time    11/08/12  9:56 PM      Result Value Range Status   MRSA by PCR POSITIVE (*) NEGATIVE Final   Comment:            The GeneXpert MRSA Assay (FDA     approved for NASAL specimens     only), is one component of a     comprehensive MRSA colonization     surveillance program. It is not     intended to diagnose MRSA     infection nor to guide or     monitor treatment for     MRSA  infections.     RESULT CALLED TO, READ BACK BY AND VERIFIED WITH:     CALLED TO RN Verl Blalock [phone] @0350  THANEY     Performed at [facility]  CULTURE, BLOOD (ROUTINE X 2)     Status: None   Collection Time    11/08/12 10:24 PM      Result Value Range Status   Specimen Description BLOOD RIGHT ARM   Final   Special Requests     Final   Value: BOTTLES DRAWN AEROBIC AND ANAEROBIC 5CC BOTH BOTTLES   Culture  Setup Time     Final   Value: 11/09/2012 04:25     Performed at [facility]   Culture     Final   Value:        BLOOD CULTURE RECEIVED NO GROWTH TO DATE CULTURE WILL BE HELD FOR 5 DAYS BEFORE ISSUING A FINAL NEGATIVE REPORT     Performed at [facility]   Report Status PENDING   Incomplete  CULTURE, BLOOD (ROUTINE X 2)     Status: None   Collection Time    11/08/12 10:28 PM      Result Value Range Status   Specimen Description BLOOD RIGHT ARM   Final   Special Requests     Final   Value: BOTTLES DRAWN AEROBIC AND ANAEROBIC 5CC BOTH BOTTLES   Culture  Setup Time     Final   Value: 11/09/2012 04:25     Performed at [facility]   Culture     Final   Value:        BLOOD CULTURE RECEIVED NO GROWTH TO DATE CULTURE WILL BE HELD FOR 5 DAYS BEFORE ISSUING A FINAL NEGATIVE REPORT     Performed at [facility]   Report Status PENDING   Incomplete  CLOSTRIDIUM DIFFICILE BY PCR     Status: None   Collection Time    11/11/12  7:19 PM      Result Value Range Status   C difficile by pcr NEGATIVE  NEGATIVE Final  Comment: Performed at [facility]     Scheduled Meds: . ipratropium  0.5 mg Nebulization TID   And  . albuterol  2.5 mg Nebulization TID  . aspirin EC  81 mg Oral Daily  . atorvastatin  40 mg Oral q1800  . carvedilol  6.25 mg Oral BID WC  . Chlorhexidine Gluconate Cloth  6 each Topical Q0600  . clonazePAM  0.5 mg Oral BID  . furosemide  40 mg Intravenous BID  . levETIRAcetam  500 mg Oral BID  . lisinopril  20 mg Oral  Daily  . loratadine  10 mg Oral Daily  . LORazepam  0-4 mg Intravenous Q12H  . LORazepam  0-4 mg Oral Q12H  . mupirocin ointment  1 application Nasal BID  . pantoprazole  40 mg Oral Q1200  . piperacillin-tazobactam (ZOSYN)  IV  3.375 g Intravenous Q8H  . potassium chloride  40 mEq Oral Daily  . spironolactone  12.5 mg Oral Daily  . thiamine  100 mg Oral Daily   Or  . thiamine  100 mg Intravenous Daily  . venlafaxine XR  150 mg Oral Daily   Continuous Infusions:     Renae Fickle, MD  [facility] Pager [phone]  If 7PM-7AM, please contact night-coverage [email or web address] Password [email or web address] 11/12/2012, 1:42 PM   LOS: 4 days

## 2012-11-12 NOTE — Progress Notes (Signed)
At 2108, patient had a 10 beat run of V tach. Patient Asymptomatic, VS normal. Notified the on call doctor to make them aware. No new orders given. Will continue to monitor.

## 2012-11-13 ENCOUNTER — Inpatient Hospital Stay (HOSPITAL_COMMUNITY)
Admit: 2012-11-13 | Discharge: 2012-11-13 | Disposition: A | Discharge status: Home / Self Care | Payer: No Typology Code available for payment source | Attending: Cardiovascular Disease | Admitting: Cardiovascular Disease

## 2012-11-13 ENCOUNTER — Other Ambulatory Visit: Payer: Self-pay

## 2012-11-13 ENCOUNTER — Ambulatory Visit (HOSPITAL_COMMUNITY)
Admit: 2012-11-13 | Discharge: 2012-11-13 | Disposition: A | Discharge status: Home / Self Care | Payer: No Typology Code available for payment source | Attending: Cardiovascular Disease | Admitting: Cardiovascular Disease

## 2012-11-13 ENCOUNTER — Encounter (HOSPITAL_COMMUNITY)
Admit: 2012-11-13 | Discharge: 2012-11-13 | Disposition: A | Discharge status: Home / Self Care | Payer: No Typology Code available for payment source | Attending: Cardiovascular Disease | Admitting: Cardiovascular Disease

## 2012-11-13 DIAGNOSIS — I428 Other cardiomyopathies: Secondary | ICD-10-CM

## 2012-11-13 DIAGNOSIS — I429 Cardiomyopathy, unspecified: Secondary | ICD-10-CM | POA: Diagnosis present

## 2012-11-13 LAB — BASIC METABOLIC PANEL
BUN: 12 mg/dL (ref 6–23)
Calcium: 9 mg/dL (ref 8.4–10.5)
Chloride: 92 mEq/L — ABNORMAL LOW (ref 96–112)
GFR calc non Af Amer: 75 mL/min — ABNORMAL LOW (ref >90)
Glucose, Bld: 111 mg/dL — ABNORMAL HIGH (ref 70–99)

## 2012-11-13 MED ORDER — LEVETIRACETAM 500 MG PO TABS: 500.0000 mg | ORAL_TABLET | Freq: Two times a day (BID) | ORAL | Status: DC

## 2012-11-13 MED ORDER — FUROSEMIDE 40 MG PO TABS: 40.0000 mg | ORAL_TABLET | Freq: Every day | ORAL | Status: DC

## 2012-11-13 MED ORDER — CARVEDILOL 6.25 MG PO TABS: 6.2500 mg | ORAL_TABLET | Freq: Two times a day (BID) | ORAL | Status: DC

## 2012-11-13 MED ORDER — POTASSIUM CHLORIDE CRYS ER 20 MEQ PO TBCR: 40.0000 meq | EXTENDED_RELEASE_TABLET | Freq: Every day | ORAL | Status: DC

## 2012-11-13 MED ORDER — SPIRONOLACTONE 12.5 MG HALF TABLET: 12.5000 mg | ORAL_TABLET | Freq: Every day | ORAL | Status: DC

## 2012-11-13 MED ORDER — ATORVASTATIN CALCIUM 40 MG PO TABS: 40.0000 mg | ORAL_TABLET | Freq: Every day | ORAL | Status: DC

## 2012-11-13 MED ORDER — AMOXICILLIN-POT CLAVULANATE 875-125 MG PO TABS: 1.0000 | ORAL_TABLET | Freq: Two times a day (BID) | ORAL | Status: DC

## 2012-11-13 MED ORDER — REGADENOSON 0.4 MG/5ML IV SOLN
0.4000 mg | Freq: Once | INTRAVENOUS | Status: AC
  Administered 2012-11-13: 0.4 mg via INTRAVENOUS

## 2012-11-13 MED ORDER — TECHNETIUM TC 99M SESTAMIBI GENERIC - CARDIOLITE
10.0000 | Freq: Once | INTRAVENOUS | Status: AC | PRN
  Administered 2012-11-13: 10 via INTRAVENOUS

## 2012-11-13 MED ORDER — TECHNETIUM TC 99M SESTAMIBI - CARDIOLITE
30.0000 | Freq: Once | INTRAVENOUS | Status: AC | PRN
  Administered 2012-11-13: 30 via INTRAVENOUS

## 2012-11-13 MED ORDER — NICOTINE 21 MG/24HR TD PT24
21.0000 mg | MEDICATED_PATCH | Freq: Every day | TRANSDERMAL | Status: DC
  Administered 2012-11-13: 21 mg via TRANSDERMAL
  Filled 2012-11-13: qty 1

## 2012-11-13 MED ORDER — ASPIRIN 81 MG PO TBEC: 81.0000 mg | DELAYED_RELEASE_TABLET | Freq: Every day | ORAL | Status: DC

## 2012-11-13 MED ORDER — THIAMINE HCL 100 MG PO TABS: 100.0000 mg | ORAL_TABLET | Freq: Every day | ORAL | Status: DC

## 2012-11-13 MED ORDER — BECLOMETHASONE DIPROPIONATE 40 MCG/ACT IN AERS: 2.0000 | INHALATION_SPRAY | Freq: Two times a day (BID) | RESPIRATORY_TRACT | Status: DC

## 2012-11-13 MED ORDER — LISINOPRIL 20 MG PO TABS: 20.0000 mg | ORAL_TABLET | Freq: Every day | ORAL | Status: DC

## 2012-11-13 MED ORDER — NICOTINE 21 MG/24HR TD PT24: 1.0000 | MEDICATED_PATCH | Freq: Every day | TRANSDERMAL | Status: DC

## 2012-11-13 NOTE — Progress Notes (Signed)
I spoke with [employer] representative. The patient has been approved for LifeVest and will get fitted today around 5:30 PM.  Allayne Butcher, PA-C

## 2012-11-13 NOTE — Progress Notes (Signed)
[facility] PROGRESS NOTE  Juan [name] ZOX:[id number] DOB: [birth year] DOA: 11/08/2012 PCP: Renold Don, MD  Brief history 62 year old male with a history of daily alcohol use for the past 15 years presented on 11/08/2012 after the wife noted him to have bilateral rhythmic jerking for about 30 seconds. He experienced a post ictal state. The patient had an alcohol withdrawal seizure approximately 2 years ago when he abstained from alcohol and his Klonopin. Neurology was consulted and recommended Keppra for one week. The patient had an episode of worsening encephalopathy during the hospitalization which was thought to be due to none witnessed seizure with postictal state. Workup has revealed aspiration pneumonia. He continues on Zosyn. His encephalopathy has improved.  He has become more SOB and wheezy, and has underlying chronic cough.    Assessment/Plan:  Alcohol withdrawal seizure,  Patient drinks 4-5 beers per day times the last 15 years.   -Appreciate neurology consult -Continue Keppra for a one-week, day 5/7. -EEG--negative for epileptiform discharge -Continue CIWA  Acute encephalopathy, Secondary to seizure and resolved.  Ammonia 36, troponins negative x3, B1 746, RBC folate 1458, and TSH 1.045.    Aspiration pneumonia  -Continue Zosyn day 6 -Aerosolized albuterol and Atrovent   Increased SOB and wheezing without acute respiratory failure.  Likely acute on chronic systolic heart failure exacerbation superimposed on chronic COPD.    Chronic COPD -  Continue duonebs -  Continue symbicort  ICM with EF previously 40% and currently 30-35%  (see above) -  Continue ASA, beta blocker, ACEI, spironolactone -  daily weights and strict I/O:  I/O Not strictly recorded but weight down 1.5kg -  Pro-BNP 8689 -  CXR:  Interstitial edema and left pleural effusion -  Lasix 40mg  IV BID with daily potassium supplementation -  Troponins were negative at admission  -  Continue telemetry:   NSVT and PVC.   -  Cardiology recommending inpatient stress test, further diuresis, and possible life vest   Probable CAD given akinesis of inferior myocardium  -  Continue ASA, statin, and BB -  Lipid panel demonstrates adequate cholesterol control and A1c 5.8  RBBB with LAFB and NSVT.   -  Life vest at discharge  Hypertension BP stable.  Continue carvedilol and lisinopril   Hyponatremia Appears to be chronic ranging 126-131 and may be related to beer potomania and CHF.  May also have some underlying alcoholic cirrhosis.    Hypokalemia and hypomagnesemia -Repleted with oral potassium and IV magnesium  Polysubstance abuse  -Tobacco and alcohol cessation discussed   Normocytic anemia and thrombocytopenia, may be related to chronic alcohol use  Intermittent diarrhea with several watery stools this morning.  DDx includes IBS, chronic pancreatitis -  C. Diff neg  DIET:  Healthy heart (added 2gm sodium and 1.2L fluid restriction) ACCESS:  PIV IVF:  OFF Proph:  lovenox  Code:  full Family Communication: patient alone  Disposition Plan: Home later today if no evidence of reversible ischemia on stress test.     Procedures/Studies: Dg Chest 2 View  11/08/2012   CLINICAL DATA:  Shortness of breath  EXAM: CHEST  2 VIEW  COMPARISON:  10/25/2010  FINDINGS: The cardiac shadow is within normal limits. The lungs are well aerated bilaterally. Some mild increased density is noted in the right upper lobe consistent with an acute infiltrate. No other focal abnormality is noted.  IMPRESSION: Right upper lobe infiltrate.   Electronically Signed   By: Alcide Clever M.D.   On: 11/08/2012 16:35  Ct Head Wo Contrast  11/08/2012   *RADIOLOGY REPORT*  Clinical Data: Witnessed seizure; wheezing, congestion and chest tightness.  CT HEAD WITHOUT CONTRAST  Technique:  Contiguous axial images were obtained from the base of the skull through the vertex without contrast.  Comparison: CT of the head performed  10/21/2010  Findings: There is no evidence of acute infarction, mass lesion, or intra- or extra-axial hemorrhage on CT.  Scattered periventricular matter change likely reflects small vessel ischemic microangiopathy.  The posterior fossa, including the cerebellum, brainstem and fourth ventricle, is within normal limits.  The third and lateral ventricles, and basal ganglia are unremarkable in appearance.  The cerebral hemispheres are symmetric in appearance, with normal gray- white differentiation.  No mass effect or midline shift is seen.  There is no evidence of fracture; visualized osseous structures are unremarkable in appearance.  The orbits are within normal limits. Mucosal thickening is noted within the maxillary sinuses bilaterally.  The remaining paranasal sinuses and mastoid air cells are well-aerated.  No significant soft tissue abnormalities are seen.  IMPRESSION:  1.  No acute intracranial pathology seen on CT. 2.  Mild small vessel ischemic microangiopathy. 3.  Mucosal thickening in the maxillary sinuses bilaterally.   Original Report Authenticated By: Tonia Ghent, M.D.       Subjective:  States he feels less SOB.  Diarrhea decreasing somewhat.  Denies tremulousness but did not sleep much last night.     Objective: Filed Vitals:   11/12/12 1439 11/12/12 2010 11/12/12 2052 11/13/12 0500  BP:   134/83 137/85  Pulse:   92 75  Temp:   99 F (37.2 C) 98.2 F (36.8 C)  TempSrc:   Oral Oral  Resp:   18 18  Height:      Weight:    79.6 kg (175 lb 7.8 oz)  SpO2: 96% 95% 97% 97%    Intake/Output Summary (Last 24 hours) at 11/13/12 1037 Last data filed at 11/13/12 0600  Gross per 24 hour  Intake    820 ml  Output    500 ml  Net    320 ml   Weight change: -1.5 kg (-3 lb 4.9 oz) Exam:   General:  CM, no acute distress  HEENT:  /AT, MMM, no tongue fasciculations.  Kyphosis   Cardiovascular: Diminished heart sounds, regular rhythm.    Respiratory: bibasilar rales,  diminished throughout with full expiratory wheezing. No rhonchi.    Abdomen: Soft/+BS, non tender, non distended, no guarding  Extremities: trace bilateral edema, normal tone and bulk  Neuro:  No tremor  Data Reviewed: Basic Metabolic Panel:  Recent Labs Lab 11/09/12 0330 11/09/12 1200 11/10/12 0325 11/11/12 0427 11/12/12 0820 11/13/12 0328  NA 127* 129* 129* 131* 127* 131*  K 3.4* 3.1* 3.4* 3.4* 3.4* 3.6  CL 94* 97 97 97 90* 92*  CO2 22 22 24 24 28 29   GLUCOSE 124* 100* 101* 92 106* 111*  BUN 6 5* 7 6 8 12   CREATININE 0.65 0.72 0.87 0.74 0.80 1.05  CALCIUM 8.5 8.4 8.1* 7.9* 8.5 9.0  MG  --  1.5  --  1.4*  --   --   PHOS  --  3.7  --   --   --   --    Liver Function Tests:  Recent Labs Lab 11/09/12 0330 11/10/12 0325  AST 67* 47*  ALT 33 28  ALKPHOS 113 95  BILITOT 0.9 0.7  PROT 6.1 5.6*  ALBUMIN 2.9* 2.5*  No results found for this basename: LIPASE, AMYLASE,  in the last 168 hours  Recent Labs Lab 11/09/12 1200  AMMONIA 36   CBC:  Recent Labs Lab 11/08/12 1630 11/08/12 1638 11/10/12 0325 11/11/12 0427  WBC 8.7  --  8.3 8.8  NEUTROABS 7.0  --   --   --   HGB 14.8 16.3 10.8* 11.1*  HCT 41.0 48.0 31.2* 32.2*  MCV 90.1  --  92.9 92.5  PLT 204  --  140* 133*   Cardiac Enzymes:  Recent Labs Lab 11/08/12 2224 11/09/12 0330 11/09/12 0958  TROPONINI <0.30 <0.30 <0.30   BNP: No components found with this basename: POCBNP,  CBG:  Recent Labs Lab 11/08/12 1635  GLUCAP 180*    Recent Results (from the past 240 hour(s))  MRSA PCR SCREENING     Status: Abnormal   Collection Time    11/08/12  9:56 PM      Result Value Range Status   MRSA by PCR POSITIVE (*) NEGATIVE Final   Comment:            The GeneXpert MRSA Assay (FDA     approved for NASAL specimens     only), is one component of a     comprehensive MRSA colonization     surveillance program. It is not     intended to diagnose MRSA     infection nor to guide or     monitor  treatment for     MRSA infections.     RESULT CALLED TO, READ BACK BY AND VERIFIED WITH:     CALLED TO RN Verl Blalock [phone] @0350  THANEY     Performed at [facility]  CULTURE, BLOOD (ROUTINE X 2)     Status: None   Collection Time    11/08/12 10:24 PM      Result Value Range Status   Specimen Description BLOOD RIGHT ARM   Final   Special Requests     Final   Value: BOTTLES DRAWN AEROBIC AND ANAEROBIC 5CC BOTH BOTTLES   Culture  Setup Time     Final   Value: 11/09/2012 04:25     Performed at [facility]   Culture     Final   Value:        BLOOD CULTURE RECEIVED NO GROWTH TO DATE CULTURE WILL BE HELD FOR 5 DAYS BEFORE ISSUING A FINAL NEGATIVE REPORT     Performed at [facility]   Report Status PENDING   Incomplete  CULTURE, BLOOD (ROUTINE X 2)     Status: None   Collection Time    11/08/12 10:28 PM      Result Value Range Status   Specimen Description BLOOD RIGHT ARM   Final   Special Requests     Final   Value: BOTTLES DRAWN AEROBIC AND ANAEROBIC 5CC BOTH BOTTLES   Culture  Setup Time     Final   Value: 11/09/2012 04:25     Performed at [facility]   Culture     Final   Value:        BLOOD CULTURE RECEIVED NO GROWTH TO DATE CULTURE WILL BE HELD FOR 5 DAYS BEFORE ISSUING A FINAL NEGATIVE REPORT     Performed at [facility]   Report Status PENDING   Incomplete  CLOSTRIDIUM DIFFICILE BY PCR     Status: None   Collection Time    11/11/12  7:19 PM  Result Value Range Status   C difficile by pcr NEGATIVE  NEGATIVE Final   Comment: Performed at [facility]     Scheduled Meds: . ipratropium  0.5 mg Nebulization TID   And  . albuterol  2.5 mg Nebulization TID  . aspirin EC  81 mg Oral Daily  . atorvastatin  40 mg Oral q1800  . carvedilol  6.25 mg Oral BID WC  . Chlorhexidine Gluconate Cloth  6 each Topical Q0600  . furosemide  40 mg Intravenous BID  . levETIRAcetam  500 mg Oral BID  . lisinopril  20 mg Oral Daily  .  loratadine  10 mg Oral Daily  . mometasone-formoterol  2 puff Inhalation BID  . mupirocin ointment  1 application Nasal BID  . pantoprazole  40 mg Oral Q1200  . piperacillin-tazobactam (ZOSYN)  IV  3.375 g Intravenous Q8H  . potassium chloride  40 mEq Oral Daily  . regadenoson  0.4 mg Intravenous Once  . spironolactone  12.5 mg Oral Daily  . thiamine  100 mg Oral Daily   Or  . thiamine  100 mg Intravenous Daily  . venlafaxine XR  150 mg Oral Daily   Continuous Infusions:     Renae Fickle, MD  [facility] Pager [phone]  If 7PM-7AM, please contact night-coverage [email or web address] Password [email or web address] 11/13/2012, 10:37 AM   LOS: 5 days

## 2012-11-13 NOTE — Discharge Summary (Signed)
Physician Discharge Summary  Juan [name]:[id number] DOB: 12-11-1950 DOA: 11/08/2012  PCP: Renold Don, MD  Admit date: 11/08/2012 Discharge date: 11/13/2012  Recommendations for Outpatient Follow-up:  1.  Follow up with cardiology.  Clinic to call patient with appointment time and date.   2.  Patient to wear life vest continuously 3.  Started on medications for heart failure, including lasix, spironolactone, carvedilol, and lisinopril.   4.  One more day of antibiotics for aspiration pneumonia and keppra to suppress seizure to complete 7-day courses.    Discharge Diagnoses:  Principal Problem:   Acute on chronic systolic heart failure Active Problems:   Alcoholism   HTN (hypertension)   Hyponatremia   Aspiration pneumonia   Hypocalcemia   Abnormal ECG   Seizure due to alcohol withdrawal   RBBB   Alcohol dependence   Diarrhea   Cardiomyopathy, EF 30% awaiting, nuc study    Discharge Condition: stable, improved  Diet recommendation: 2gm sodium diet  Wt Readings from Last 3 Encounters:  11/13/12 79.6 kg (175 lb 7.8 oz)  08/06/12 81.194 kg (179 lb)  07/20/12 82.056 kg (180 lb 14.4 oz)    History of present illness:  Patient is a heavy drinker for the past 15 years he has been drinking 4-5 drinks a day. His last drink was yesterday  Today at 3 pm he was sitting in chair and wife noted a seizure like activity. Wife noted rhythmic jerking bilaterally for about 30 seconds. He was unresponsive and post ictal afterwards for about 30 min. Did not bite his tongue, no bladder or bowel incontinence. No history of head injury recently. 2 years ago he had a similar episode after abstaining from alcohol and stopping his klonopin suddenly. This time wife thinks he has been taking his medications. Of note his Zoloft has recently been changed to Effexor.  Denies any recent fever but may had a chill, he has smoker's cough and allergies so he has frequent cough.  He has had some vomiting  yesterday.  Wife thinks he may be interested in quitting drinking. Wife is concerned that he does not follow up with doctors as he should. She states he has a lot of anxiety and panic issues at home.  CXR in ER showed infiltrare worrisome for aspiration PNA. He received first dose of vanc and Zosyn and hospitalist was called for admittion. patient was started on CIWA protocol in ER.  Wife is also reporting occasional episodes of stool incontinence that has been going on for some time. Patient has a history of periumbilical hernia he was supposed to be cleared for surgery for this by cardiology by so far has not followed up.   Hospital Course:   Alcohol withdrawal seizure, Patient drinks 4-5 beers per day times the last 15 years and most likely had an alcohol withdrawal seizure.  His EEG was negative for epileptiform activity, but this test is nonspecific.  Neurology was consulted and recommended starting keppra to complete a one week course while Juan [name] was completing his alcohol withdrawal protocol.  He has one more day left of treatment and has been given a prescription for home.    Alcohol withdrawal.  He had some anxiety and tremulousness initially.  He was continued on his clonazepam initially, but this was discontinued after he had completed the first few days of EtOH withdrawal.  He continued CIWA protocol.  If his symptoms of withdrawal increase after discharge, he has been given instructions to take 1/2 a tab  of his clonazepam twice daily for 2 days, then 1/2 tab once daily for 2 days, then stop.  He was not given any new prescriptions.    Acute encephalopathy, Secondary to seizure and resolved. Ammonia 36, troponins negative x3, B1 746, RBC folate 1458, and TSH 1.045.   Aspiration pneumonia.  He completed 6 days of zosyn in the hospital and his cough and shortness of breath improved.  He was given a prescription for augmentin to complete a total of 7 days of antibiotics.    Acute on  chronic systolic heart failure.  He had evidence of interstitial edema on CXR and his pro-BNP was elevated at 8689.  His ECHO demonstrated a decrease in his EF from 40% a few years ago to 30-35% with some possible akinesis of the inferior myocardium.  Troponins were negative.  Cardiology was consulted.  He was started on beta blocker, ACEI, spironolactone, and IV lasix.  He diuresed 1.5kg.  Due to concern for ICM given risk factors of EtOH, smoking, HTN, he underwent NM stress test which demonstrated EF of 39%.  There were no obvious areas of reversible ischemia nor any areas of persistent ischemia.  He seemed to have global hypokinesis more consistent with alcoholic CM than ICM.  He was given education about low sodium diet and daily weights and will follow up with cardiology within 1 week of discharge.    Non-sustained ventricular tachycardia on telemetry.  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  Patient is at risk for fatal arrhythmia in the setting of CHF with EF < 35%.  He was given a life vest by cardiology which he will wear until it is determined whether he will need an implantable ICD.    Prossible CAD.  Started on ASA, statin, and BB.  Lipid panel demonstrated adequate cholesterol control and A1c 5.8.   Hypertension BP stable to mildly elevated. Continue carvedilol and lisinopril at discharge and doses will be adjusted as outpatient.  Chronic COPD.  He was given duonebs and started on symbicort.  He was encouraged to stop smoking.    Hyponatremia Appears to be chronic ranging 126-131 and may be related to beer potomania and CHF. May also have some underlying alcoholic cirrhosis.  TSH was 1.045.  Hypokalemia and hypomagnesemia due to poor nutrition.  Repleted with oral potassium and IV magnesium   Polysubstance abuse.  Tobacco and alcohol cessation discussed.  Given nicotine patch and met with social work to discuss outpatient resources.  Normocytic anemia and thrombocytopenia, may be related to  marrow suppression from chronic alcohol use.  Recommended follow up with primary care doctor for repeat CBC in a few weeks.    Intermittent diarrhea with several watery stools.  C. Diff was negative.  His current loose stools are likely related to side effect of zosyn.  He may have some chronic pancreatitis and should talk to his primary care doctor if this recurs.    Procedures:  CT head 10/5  CXR  NM stress test 10/10  Consultations:  Cardiology  Discharge Exam: Filed Vitals:   11/13/12 1127  BP: 153/88  Pulse: 88  Temp: 98.4 F (36.9 C)  Resp: 20   Filed Vitals:   11/12/12 2052 11/13/12 0500 11/13/12 1127 11/13/12 1236  BP: 134/83 137/85 153/88   Pulse: 92 75 88   Temp: 99 F (37.2 C) 98.2 F (36.8 C) 98.4 F (36.9 C)   TempSrc: Oral Oral Oral   Resp: 18 18 20    Height:  Weight:  79.6 kg (175 lb 7.8 oz)    SpO2: 97% 97% 96% 99%    General: CM, no acute distress  HEENT: [place]/AT, MMM, no tongue fasciculations. Kyphosis  Cardiovascular: Diminished heart sounds, regular rhythm.  Respiratory: bibasilar rales, diminished throughout with full expiratory wheezing. No rhonchi.  Abdomen: Soft/+BS, non tender, non distended, no guarding  Extremities: trace bilateral edema, normal tone and bulk  Neuro: No tremor   Discharge Instructions      Discharge Orders   Future Appointments Provider Department Dept Phone   11/24/2012 9:00 AM Chrystie Nose, MD [facility] [phone]   Future Orders Complete By Expires   (HEART FAILURE PATIENTS) Call MD:  Anytime you have any of the following symptoms: 1) 3 pound weight gain in 24 hours or 5 pounds in 1 week 2) shortness of breath, with or without a dry hacking cough 3) swelling in the hands, feet or stomach 4) if you have to sleep on extra pillows at night in order to breathe.  As directed    Call MD for:  difficulty breathing, headache or visual disturbances  As directed    Call MD for:  extreme fatigue  As  directed    Call MD for:  hives  As directed    Call MD for:  persistant dizziness or light-headedness  As directed    Call MD for:  persistant nausea and vomiting  As directed    Call MD for:  severe uncontrolled pain  As directed    Call MD for:  temperature >100.4  As directed    Diet - low sodium heart healthy  As directed    Discharge instructions  As directed    Comments:     You were hospitalized with alcohol withdrawal seizure and pneumonia.  You have been started antibiotics and should take your next dose of antibiotic this evening.  You should continue this antibiotic until all the tabs are gone.  For your seizure, you were started on Keppra.  Please take this medication twice a day, next dose this evening, until gone.  Because alcohol and benzodiazepines such as clonazepam effect the brain in the same way, your clonazepam should be stopped.  You should NOT stay on clonazepam, ativan, xanax, or any other medication in the benzodiazepine family because you will become addicted and dependence on them.  The symptoms of alcohol and benzodiazepine withdrawal are the same (tremulousness, agitation, difficulty sleeping).  If you start to experience worsening symptoms of withdrawal when you get home, take half a tab of clonazepam twice a day for two days, then take half a tab once a day for two days, then stop.  You were found to have worsening heart failure and have been started on several medications which are the standard of care medications for heart failure.  Please take aspirin, atorvastatin, lasix, spironolactone, potassium daily.  Your carvedilol was increased and your lisinopril dose has been kept the same, although I have given you a new prescription.  I have also given you a prescription for beclomethasone inhaler which is used to prevent wheezing so you can use your ipratropium inhaler less.  Please eat a 2gm sodium diet and weigh yourself daily.  Follow up with cardiology in one week.    Increase activity slowly  As directed        Medication List    STOP taking these medications       clonazePAM 0.5 MG tablet  Commonly known as:  KLONOPIN      TAKE these medications       amoxicillin-clavulanate 875-125 MG per tablet  Commonly known as:  AUGMENTIN  Take 1 tablet by mouth 2 (two) times daily.     aspirin 81 MG EC tablet  Take 1 tablet (81 mg total) by mouth daily.     atorvastatin 40 MG tablet  Commonly known as:  LIPITOR  Take 1 tablet (40 mg total) by mouth daily at 6 PM.     beclomethasone 40 MCG/ACT inhaler  Commonly known as:  QVAR  Inhale 2 puffs into the lungs 2 (two) times daily.     carvedilol 6.25 MG tablet  Commonly known as:  COREG  Take 1 tablet (6.25 mg total) by mouth 2 (two) times daily with a meal.     desloratadine 5 MG tablet  Commonly known as:  CLARINEX  Take 1 tablet (5 mg total) by mouth daily.     fluticasone 50 MCG/ACT nasal spray  Commonly known as:  FLONASE  Place 2 sprays into the nose daily.     furosemide 40 MG tablet  Commonly known as:  LASIX  Take 1 tablet (40 mg total) by mouth daily.     ipratropium 17 MCG/ACT inhaler  Commonly known as:  ATROVENT HFA  Inhale 2 puffs into the lungs 3 (three) times daily as needed for wheezing.     levETIRAcetam 500 MG tablet  Commonly known as:  KEPPRA  Take 1 tablet (500 mg total) by mouth 2 (two) times daily.     lisinopril 20 MG tablet  Commonly known as:  PRINIVIL,ZESTRIL  Take 1 tablet (20 mg total) by mouth daily.     nicotine 21 mg/24hr patch  Commonly known as:  NICODERM CQ - dosed in mg/24 hours  Place 1 patch onto the skin daily.     potassium chloride SA 20 MEQ tablet  Commonly known as:  K-DUR,KLOR-CON  Take 2 tablets (40 mEq total) by mouth daily.     spironolactone 12.5 mg Tabs tablet  Commonly known as:  ALDACTONE  Take 0.5 tablets (12.5 mg total) by mouth daily.     thiamine 100 MG tablet  Take 1 tablet (100 mg total) by mouth daily.      venlafaxine XR 150 MG 24 hr capsule  Commonly known as:  EFFEXOR-XR  Take 150 mg by mouth daily.       Follow-up Information   Follow up with WALDEN,JEFF, MD. Schedule an appointment as soon as possible for a visit in 1 week.   Specialty:  Family Medicine   Contact information:   [address] [place] [address] [phone]       Follow up with Thurmon Fair, MD. Schedule an appointment as soon as possible for a visit in 1 week.   Specialty:  Cardiology   Contact information:   [address] [place] [address] [phone]       The results of significant diagnostics from this hospitalization (including imaging, microbiology, ancillary and laboratory) are listed below for reference.    Significant Diagnostic Studies: Dg Chest 2 View  11/08/2012   CLINICAL DATA:  Shortness of breath  EXAM: CHEST  2 VIEW  COMPARISON:  10/25/2010  FINDINGS: The cardiac shadow is within normal limits. The lungs are well aerated bilaterally. Some mild increased density is noted in the right upper lobe consistent with an acute infiltrate. No other focal abnormality is noted.  IMPRESSION: Right upper lobe  infiltrate.   Electronically Signed   By: Alcide Clever M.D.   On: 11/08/2012 16:35   Ct Head Wo Contrast  11/08/2012   *RADIOLOGY REPORT*  Clinical Data: Witnessed seizure; wheezing, congestion and chest tightness.  CT HEAD WITHOUT CONTRAST  Technique:  Contiguous axial images were obtained from the base of the skull through the vertex without contrast.  Comparison: CT of the head performed 10/21/2010  Findings: There is no evidence of acute infarction, mass lesion, or intra- or extra-axial hemorrhage on CT.  Scattered periventricular matter change likely reflects small vessel ischemic microangiopathy.  The posterior fossa, including the cerebellum, brainstem and fourth ventricle, is within normal limits.  The third and lateral ventricles, and basal ganglia are unremarkable in  appearance.  The cerebral hemispheres are symmetric in appearance, with normal gray- white differentiation.  No mass effect or midline shift is seen.  There is no evidence of fracture; visualized osseous structures are unremarkable in appearance.  The orbits are within normal limits. Mucosal thickening is noted within the maxillary sinuses bilaterally.  The remaining paranasal sinuses and mastoid air cells are well-aerated.  No significant soft tissue abnormalities are seen.  IMPRESSION:  1.  No acute intracranial pathology seen on CT. 2.  Mild small vessel ischemic microangiopathy. 3.  Mucosal thickening in the maxillary sinuses bilaterally.   Original Report Authenticated By: Tonia Ghent, M.D.   Nm Myocar Multi W/spect W/wall Motion / Ef  11/13/2012   *RADIOLOGY REPORT*  Clinical Data:  Evaluate left ventricular ejection fraction  MYOCARDIAL IMAGING WITH SPECT (REST AND PHARMACOLOGIC-STRESS) GATED LEFT VENTRICULAR WALL MOTION STUDY LEFT VENTRICULAR EJECTION FRACTION  Technique:  Standard myocardial SPECT imaging was performed after resting intravenous injection of 10 mCi Tc-56m sestamibi. Subsequently, intravenous infusion of lexiscan was performed under the supervision of the Cardiology staff.  At peak effect of the drug, 30 mCi Tc-91m sestamibi was injected intravenously and standard myocardial SPECT  imaging was performed.  Quantitative gated imaging was also performed to evaluate left ventricular wall motion, and estimate left ventricular ejection fraction.  Comparison:  Chest radiograph - 11/11/2012; CT abdomen pelvis - 09/15/2012  Findings:  Review of the rotational raw images demonstrates mild patient motion artifact on the provided stress images.  GI activity is noted on both the provided rest and stress images.  SPECT imaging demonstrates a dilated left ventricle.  There is mild attenuation involving the inferior wall extending towards the left ventricular apex which is noted to minimally improve on  the provided stress images.  This area is not associated with a discrete wall motion abnormality to definitely support the sequela of prior infarction.  Quantitative gated analysis shows global hypokinesia without discrete angiographic wall motion abnormality with special attention paid to the inferior wall and apex.  The resting left ventricular ejection fraction is 39% with end- diastolic volume of 181 ml and end-systolic volume of 110 ml.  IMPRESSION: 1.  Attenuation involving the inferior wall and left ventricular apex without discrete associated wall motion abnormality to suggest prior myocardial infarction. 2.  No definitive evidence of pharmacologically induced ischemia. 3.  Dilated left ventricle with global hypokinesia.  Ejection fraction - 39%.  Above findings discussed with Juan Harness, PA at 12:01.   Original Report Authenticated By: Tacey Ruiz, MD   Dg Chest Port 1 View  11/11/2012   CLINICAL DATA:  Cough, fever, shortness of breath and chest pain.  EXAM: PORTABLE CHEST - 1 VIEW  COMPARISON:  11/08/2012.  FINDINGS: Trachea is midline.  Heart size is within normal limits. Worsening right perihilar airspace consolidation. Mild interstitial prominence at the lung bases, left greater than right. Small left pleural effusion.  IMPRESSION: 1. Worsening right upper lobe airspace disease, worrisome for pneumonia. Followup to clearing is recommended. 2. Possible early pneumonia in the lung bases, versus atelectasis. Continued attention on followup exams is warranted. 3. Small left pleural effusion.   Electronically Signed   By: Leanna Battles M.D.   On: 11/11/2012 14:45    Microbiology: Recent Results (from the past 240 hour(s))  MRSA PCR SCREENING     Status: Abnormal   Collection Time    11/08/12  9:56 PM      Result Value Range Status   MRSA by PCR POSITIVE (*) NEGATIVE Final   Comment:            The GeneXpert MRSA Assay (FDA     approved for NASAL specimens     only), is one component of a      comprehensive MRSA colonization     surveillance program. It is not     intended to diagnose MRSA     infection nor to guide or     monitor treatment for     MRSA infections.     RESULT CALLED TO, READ BACK BY AND VERIFIED WITH:     CALLED TO RN Verl Blalock [phone] @0350  THANEY     Performed at [facility]  CULTURE, BLOOD (ROUTINE X 2)     Status: None   Collection Time    11/08/12 10:24 PM      Result Value Range Status   Specimen Description BLOOD RIGHT ARM   Final   Special Requests     Final   Value: BOTTLES DRAWN AEROBIC AND ANAEROBIC 5CC BOTH BOTTLES   Culture  Setup Time     Final   Value: 11/09/2012 04:25     Performed at [facility]   Culture     Final   Value:        BLOOD CULTURE RECEIVED NO GROWTH TO DATE CULTURE WILL BE HELD FOR 5 DAYS BEFORE ISSUING A FINAL NEGATIVE REPORT     Performed at [facility]   Report Status PENDING   Incomplete  CULTURE, BLOOD (ROUTINE X 2)     Status: None   Collection Time    11/08/12 10:28 PM      Result Value Range Status   Specimen Description BLOOD RIGHT ARM   Final   Special Requests     Final   Value: BOTTLES DRAWN AEROBIC AND ANAEROBIC 5CC BOTH BOTTLES   Culture  Setup Time     Final   Value: 11/09/2012 04:25     Performed at [facility]   Culture     Final   Value:        BLOOD CULTURE RECEIVED NO GROWTH TO DATE CULTURE WILL BE HELD FOR 5 DAYS BEFORE ISSUING A FINAL NEGATIVE REPORT     Performed at [facility]   Report Status PENDING   Incomplete  CLOSTRIDIUM DIFFICILE BY PCR     Status: None   Collection Time    11/11/12  7:19 PM      Result Value Range Status   C difficile by pcr NEGATIVE  NEGATIVE Final   Comment: Performed at [facility]     Labs: Basic Metabolic Panel:  Recent Labs Lab 11/09/12 0330 11/09/12 1200 11/10/12 0325 11/11/12 9629  11/12/12 0820 11/13/12 0328  NA 127* 129* 129* 131* 127* 131*  K 3.4* 3.1* 3.4* 3.4* 3.4* 3.6  CL 94* 97 97 97 90*  92*  CO2 22 22 24 24 28 29   GLUCOSE 124* 100* 101* 92 106* 111*  BUN 6 5* 7 6 8 12   CREATININE 0.65 0.72 0.87 0.74 0.80 1.05  CALCIUM 8.5 8.4 8.1* 7.9* 8.5 9.0  MG  --  1.5  --  1.4*  --   --   PHOS  --  3.7  --   --   --   --    Liver Function Tests:  Recent Labs Lab 11/09/12 0330 11/10/12 0325  AST 67* 47*  ALT 33 28  ALKPHOS 113 95  BILITOT 0.9 0.7  PROT 6.1 5.6*  ALBUMIN 2.9* 2.5*   No results found for this basename: LIPASE, AMYLASE,  in the last 168 hours  Recent Labs Lab 11/09/12 1200  AMMONIA 36   CBC:  Recent Labs Lab 11/08/12 1630 11/08/12 1638 11/10/12 0325 11/11/12 0427  WBC 8.7  --  8.3 8.8  NEUTROABS 7.0  --   --   --   HGB 14.8 16.3 10.8* 11.1*  HCT 41.0 48.0 31.2* 32.2*  MCV 90.1  --  92.9 92.5  PLT 204  --  140* 133*   Cardiac Enzymes:  Recent Labs Lab 11/08/12 2224 11/09/12 0330 11/09/12 0958  TROPONINI <0.30 <0.30 <0.30   BNP: BNP (last 3 results)  Recent Labs  11/11/12 0427 11/13/12 0328  PROBNP 8689.0* 5340.0*   CBG:  Recent Labs Lab 11/08/12 1635  GLUCAP 180*    Time coordinating discharge: 45 minutes  Signed:  Atiyah Bauer  [facility] 11/13/2012, 2:35 PM

## 2012-11-13 NOTE — Progress Notes (Signed)
Subjective: No complaints, plan for discharge today per pt and results of nuc study.  Life vest has been approved and will be placed this pm.  Objective: Vital signs in last 24 hours: Temp:  [98.2 F (36.8 C)-99 F (37.2 C)] 98.2 F (36.8 C) (10/10 0500) Pulse Rate:  [75-99] 93 (10/10 0954) Resp:  [18-20] 18 (10/10 0500) BP: (122-168)/(81-100) 159/95 mmHg (10/10 0954) SpO2:  [94 %-97 %] 97 % (10/10 0500) Weight:  [175 lb 7.8 oz (79.6 kg)] 175 lb 7.8 oz (79.6 kg) (10/10 0500) Weight change: -3 lb 4.9 oz (-1.5 kg) Last BM Date: 11/12/12  Intake/Output from previous day: + 560  (+5417 since admit)   Wt 175.8 down from 186 at peak. 10/09 0701 - 10/10 0700 In: 1060 [P.O.:960; IV Piggyback:100] Out: 500 [Urine:500] Intake/Output this shift:    PE: General:Pleasant affect, NAD Skin:Warm and dry, brisk capillary refill HEENT:normocephalic, sclera clear, mucus membranes moist Neck:supple,  no bruits  Heart:S1S2 RRR without murmur, gallup, rub or click Lungs:clear without rales, rhonchi, or wheezes WUJ:WJXB, non tender, + BS, do not palpate liver spleen or masses Ext:no lower ext edema Neuro:alert and oriented, MAE, follows commands, + facial symmetry   Lab Results:  Recent Labs  11/11/12 0427  WBC 8.8  HGB 11.1*  HCT 32.2*  PLT 133*   BMET  Recent Labs  11/12/12 0820 11/13/12 0328  NA 127* 131*  K 3.4* 3.6  CL 90* 92*  CO2 28 29  GLUCOSE 106* 111*  BUN 8 12  CREATININE 0.80 1.05  CALCIUM 8.5 9.0   No results found for this basename: TROPONINI, CK, MB,  in the last 72 hours  Lab Results  Component Value Date   CHOL 121 11/11/2012   HDL 48 11/11/2012   LDLCALC 58 11/11/2012   LDLDIRECT 74 07/20/2012   TRIG 73 11/11/2012   CHOLHDL 2.5 11/11/2012   Lab Results  Component Value Date   HGBA1C 5.8* 11/11/2012     Lab Results  Component Value Date   TSH 1.045 11/09/2012    Hepatic Function Panel No results found for this basename: PROT, ALBUMIN,  AST, ALT, ALKPHOS, BILITOT, BILIDIR, IBILI,  in the last 72 hours  Recent Labs  11/11/12 1528  CHOL 121   No results found for this basename: PROTIME,  in the last 72 hours      Studies/Results: Dg Chest Port 1 View  11/11/2012   CLINICAL DATA:  Cough, fever, shortness of breath and chest pain.  EXAM: PORTABLE CHEST - 1 VIEW  COMPARISON:  11/08/2012.  FINDINGS: Trachea is midline. Heart size is within normal limits. Worsening right perihilar airspace consolidation. Mild interstitial prominence at the lung bases, left greater than right. Small left pleural effusion.  IMPRESSION: 1. Worsening right upper lobe airspace disease, worrisome for pneumonia. Followup to clearing is recommended. 2. Possible early pneumonia in the lung bases, versus atelectasis. Continued attention on followup exams is warranted. 3. Small left pleural effusion.   Electronically Signed   By: Leanna Battles M.D.   On: 11/11/2012 14:45  2D echo: Left ventricle: The cavity size was normal. Systolic function was moderately to severely reduced. The estimated ejection fraction was in the range of 30% to 35%. There is akinesis of the basalinferior myocardium. Transthoracic echocardiography. M-mode, complete 2D, spectral Doppler, and color Doppler. Height: Height: 172.7cm. Height: 68in. Weight: Weight: 84.5kg. Weight: 186lb. Body mass index: BMI: 28.3kg/m^2. Body surface area: BSA: 2.9m^2. Blood pressure:  162/100. Patient status: Inpatient. Location: Bedside.    Medications: I have reviewed the patient's current medications. Scheduled Meds: . ipratropium  0.5 mg Nebulization TID   And  . albuterol  2.5 mg Nebulization TID  . aspirin EC  81 mg Oral Daily  . atorvastatin  40 mg Oral q1800  . carvedilol  6.25 mg Oral BID WC  . Chlorhexidine Gluconate Cloth  6 each Topical Q0600  . furosemide  40 mg Intravenous BID  . levETIRAcetam  500 mg Oral BID  . lisinopril  20 mg Oral Daily  . loratadine  10 mg Oral Daily    . mometasone-formoterol  2 puff Inhalation BID  . mupirocin ointment  1 application Nasal BID  . pantoprazole  40 mg Oral Q1200  . piperacillin-tazobactam (ZOSYN)  IV  3.375 g Intravenous Q8H  . potassium chloride  40 mEq Oral Daily  . regadenoson  0.4 mg Intravenous Once  . spironolactone  12.5 mg Oral Daily  . thiamine  100 mg Oral Daily   Or  . thiamine  100 mg Intravenous Daily  . venlafaxine XR  150 mg Oral Daily   Continuous Infusions:  PRN Meds:.acetaminophen, guaiFENesin, ibuprofen, levalbuterol, loperamide, menthol-cetylpyridinium  Assessment/Plan: Principal Problem:   Acute on chronic systolic heart failure Active Problems:   Alcoholism   HTN (hypertension)   Hyponatremia   Aspiration pneumonia   Hypocalcemia   Abnormal ECG   Seizure due to alcohol withdrawal   RBBB   Alcohol dependence   Diarrhea   Cardiomyopathy, EF 30% awaiting, nuc study   PLAN: if no ischemia on nuc study his cardiomyopathy could be alcoholic induced.  Awaiting Lexiscan.  He is on BB, ACE, Statin.  He is on antibiotics for pna.  Will need close follow up ( has appt with Dr. [name] 11/24/12) and repeat echo once his meds have been titrated to max dose.  If EF remains low, may need ICD.  For now he has been approved for lifevest and he does agree to wear it.    Steffanie Dunn has been completed without complications     LOS: 5 days   Time spent with pt. :15 minutes for routine visit alone. [facility] R  Nurse Practitioner Certified Pager [phone] 11/13/2012, 10:13 AM     Patient seen and examined. Agree with assessment and plan. No chest pain. Mild shortness of breath with lexiscan. Await images. If no significant ischemia for possible dc later today. Life-vest to be applied. Medical titration as outpatient.   Lennette Bihari, MD, [facility] 11/13/2012 10:29 AM

## 2012-11-15 LAB — CULTURE, BLOOD (ROUTINE X 2)
Culture: NO GROWTH
Culture: NO GROWTH

## 2012-11-20 ENCOUNTER — Encounter (INDEPENDENT_AMBULATORY_CARE_PROVIDER_SITE_OTHER): Payer: BC Managed Care – PPO | Admitting: General Surgery

## 2012-11-24 ENCOUNTER — Encounter: Payer: Self-pay | Admitting: Internal Medicine

## 2012-11-24 ENCOUNTER — Telehealth (HOSPITAL_COMMUNITY): Payer: Self-pay | Admitting: *Deleted

## 2012-11-24 ENCOUNTER — Ambulatory Visit (INDEPENDENT_AMBULATORY_CARE_PROVIDER_SITE_OTHER): Payer: No Typology Code available for payment source | Admitting: Internal Medicine

## 2012-11-24 VITALS — BP 112/76 | HR 79 | Ht 68.0 in | Wt 178.3 lb

## 2012-11-24 DIAGNOSIS — I10 Essential (primary) hypertension: Secondary | ICD-10-CM

## 2012-11-24 DIAGNOSIS — K420 Umbilical hernia with obstruction, without gangrene: Secondary | ICD-10-CM

## 2012-11-24 DIAGNOSIS — I5023 Acute on chronic systolic (congestive) heart failure: Secondary | ICD-10-CM

## 2012-11-24 DIAGNOSIS — I429 Cardiomyopathy, unspecified: Secondary | ICD-10-CM

## 2012-11-24 DIAGNOSIS — R9431 Abnormal electrocardiogram [ECG] [EKG]: Secondary | ICD-10-CM

## 2012-11-24 DIAGNOSIS — E871 Hypo-osmolality and hyponatremia: Secondary | ICD-10-CM

## 2012-11-24 DIAGNOSIS — I4510 Unspecified right bundle-branch block: Secondary | ICD-10-CM

## 2012-11-24 DIAGNOSIS — F1020 Alcohol dependence, uncomplicated: Secondary | ICD-10-CM

## 2012-11-24 DIAGNOSIS — I428 Other cardiomyopathies: Secondary | ICD-10-CM

## 2012-11-24 MED ORDER — FUROSEMIDE 40 MG PO TABS: 40.0000 mg | ORAL_TABLET | Freq: Every day | ORAL | Status: DC

## 2012-11-24 MED ORDER — SPIRONOLACTONE 12.5 MG HALF TABLET: 12.5000 mg | ORAL_TABLET | Freq: Every day | ORAL | Status: DC

## 2012-11-24 MED ORDER — LISINOPRIL 20 MG PO TABS: 20.0000 mg | ORAL_TABLET | Freq: Every day | ORAL | Status: DC

## 2012-11-24 MED ORDER — CARVEDILOL 6.25 MG PO TABS: 6.2500 mg | ORAL_TABLET | Freq: Two times a day (BID) | ORAL | Status: DC

## 2012-11-24 MED ORDER — ATORVASTATIN CALCIUM 40 MG PO TABS: 40.0000 mg | ORAL_TABLET | Freq: Every day | ORAL | Status: DC

## 2012-11-24 NOTE — Progress Notes (Signed)
OFFICE NOTE  Chief Complaint:  Hospital follow-up, feeling better  Primary Care Physician: Renold Don, MD  HPI:  Juan [name] is a 62 yo male with a history of heavy drinking for the past 15 years he has been drinking 4-5 drinks a day. He presented to [facility] after seizure like activity. Wife noted rhythmic jerking bilaterally for about 30 seconds. He was unresponsive and post ictal afterwards for about 30 min. Did not bite his tongue, no bladder or bowel incontinence. No history of head injury recently. 2 years ago he had a similar episode after abstaining from alcohol and stopping his klonopin suddenly. This time wife thinks he has been taking his medications. Of note his Zoloft has recently been changed to Effexor. He also presented with an aspiration pneumonia and was treated for such. It was felt that he had acute on chronic systolic heart failure exacerbation. An echocardiogram was performed which demonstrated a severe cardiomyopathy with global hypokinesis and EF of 30-35%.  He underwent a nuclear stress test which did not demonstrate any signs of ischemia therefore was thought that he had a nonischemic cardiomyopathy possibly related to alcohol use.   He was diuresed and then placed on appropriate heart failure medications. A life vest was then placed because he had some episodes of nonsustained ventricular tachycardia in a hospital. It was thought this may be due to a metabolic derangement. Since discharge she has been clinically stable. His weight has remained stable and he has had no further signs of heart failure. He denies any shocks with a life vest. He does report that it may be hurting his back somewhat. He has been abstinent from alcohol since his hospitalization. His only other main concern is that he has history of anxiety and felt that he did better on Zoloft and Effexor versus Effexor alone. He wishes to discuss this with his psychiatrist, but is apparently taken  off of Zoloft due to hyponatremia. I suspect this may be due to heart failure, not the medication. If he does feel that he did better on Zoloft it would be reasonable to consider restarting that. I think anything we can do to keep him from drinking would be best for his heart.  PMHx:  Past Medical History  Diagnosis Date  . CHF (congestive heart failure) 10/2010    ECHO:  EF 40%, Grade II diastolic dysfunction  . Alcoholism   . Allergy     seasonal   . Anxiety   . Depression   . Asthma   . Hyperlipidemia   . Hypertension   . Seizures     Past Surgical History  Procedure Laterality Date  . Removal of ingrown toenail      FAMHx:  Family History  Problem Relation Age of Onset  . Alzheimer's disease Mother   . Diabetes Mother   . Heart disease Maternal Uncle     SOCHx:   reports that he has been smoking Cigarettes.  He has a 40 pack-year smoking history. He has never used smokeless tobacco. He reports that he does not drink alcohol or use illicit drugs.  ALLERGIES:  No Known Allergies  ROS: A comprehensive review of systems was negative except for: Constitutional: positive for fatigue Respiratory: positive for dyspnea on exertion Cardiovascular: positive for fatigue Behavioral/Psych: positive for anxiety  HOME MEDS: Current Outpatient Prescriptions  Medication Sig Dispense Refill  . aspirin EC 81 MG EC tablet Take 1 tablet (81 mg total) by mouth daily.  90 tablet  0  . atorvastatin (LIPITOR) 40 MG tablet Take 1 tablet (40 mg total) by mouth daily at 6 PM.  30 tablet  6  . beclomethasone (QVAR) 40 MCG/ACT inhaler Inhale 2 puffs into the lungs 2 (two) times daily.  1 Inhaler  0  . carvedilol (COREG) 6.25 MG tablet Take 1 tablet (6.25 mg total) by mouth 2 (two) times daily with a meal.  60 tablet  6  . desloratadine (CLARINEX) 5 MG tablet Take 1 tablet (5 mg total) by mouth daily.  30 tablet  2  . fluticasone (FLONASE) 50 MCG/ACT nasal spray Place 2 sprays into the nose  daily.      . furosemide (LASIX) 40 MG tablet Take 1 tablet (40 mg total) by mouth daily.  30 tablet  6  . ipratropium (ATROVENT HFA) 17 MCG/ACT inhaler Inhale 2 puffs into the lungs 3 (three) times daily as needed for wheezing.  1 Inhaler  3  . lisinopril (PRINIVIL,ZESTRIL) 20 MG tablet Take 1 tablet (20 mg total) by mouth daily.  30 tablet  6  . potassium chloride SA (K-DUR,KLOR-CON) 20 MEQ tablet Take 2 tablets (40 mEq total) by mouth daily.  60 tablet  0  . spironolactone (ALDACTONE) 12.5 mg TABS tablet Take 0.5 tablets (12.5 mg total) by mouth daily.  15 tablet  6  . thiamine 100 MG tablet Take 1 tablet (100 mg total) by mouth daily.  30 tablet  0  . venlafaxine XR (EFFEXOR-XR) 150 MG 24 hr capsule Take 150 mg by mouth daily.      . [DISCONTINUED] albuterol (PROVENTIL,VENTOLIN) 90 MCG/ACT inhaler Inhale 2 puffs into the lungs every 6 (six) hours as needed for wheezing.  17 g  0   No current facility-administered medications for this visit.    LABS/IMAGING: No results found for this or any previous visit (from the past 48 hour(s)). No results found.  VITALS: BP 112/76  Pulse 79  Ht 5\' 8"  (1.727 m)  Wt 178 lb 4.8 oz (80.876 kg)  BMI 27.12 kg/m2  EXAM: General appearance: alert and no distress Neck: no carotid bruit and no JVD Lungs: clear to auscultation bilaterally Heart: regular rate and rhythm, S1, S2 normal, no murmur, click, rub or gallop Abdomen: soft, non-tender; bowel sounds normal; no masses,  no organomegaly Extremities: extremities normal, atraumatic, no cyanosis or edema Pulses: 2+ and symmetric Skin: Skin color, texture, turgor normal. No rashes or lesions Neurologic: Grossly normal Psych: Does not appear anxious  EKG: Normal sinus rhythm at 79  ASSESSMENT: 1. Nonischemic cardiomyopathy, EF 30-40% on life vest due to nonsustained ventricular arrhythmias 2. Recent alcohol abuse and withdrawal seizure 3. History of anxiety 4. Aspiration  pneumonia-resolved  PLAN: 1.   [name] is on appropriate heart failure medications. His weight has been stable since discharge. He has had no shocks from his life vest. He has been successful at discontinuing alcohol and has had no further seizures. I like to continue his current heart failure medications and plan to see him back in 3 months after a repeat echocardiogram to assess his LV function. His EF remains depressed at that time, he may be a candidate for an AICD for primary prevention of sudden cardiac death. I would see no problem in adding Zoloft to his regimen again if he felt that it kept him from being as anxious and to keep him from self-medicating with alcohol.   Chrystie Nose, MD, [facility] Attending Cardiologist [facility]  HILTY,Kenneth C 11/24/2012, 2:36 PM

## 2012-11-24 NOTE — Patient Instructions (Signed)
Your physician has requested that you have an echocardiogram. Echocardiography is a painless test that uses sound waves to create images of your heart. It provides your doctor with information about the size and shape of your heart and how well your heart's chambers and valves are working. This procedure takes approximately one hour. There are no restrictions for this procedure.  Please have done in about 3 months  Schedule a follow up appointment with Dr. [name] after your echo procedure in about 3 months.

## 2012-11-25 ENCOUNTER — Encounter: Payer: Self-pay | Admitting: Internal Medicine

## 2012-11-26 ENCOUNTER — Encounter: Payer: Self-pay | Admitting: Family Medicine

## 2012-11-26 ENCOUNTER — Ambulatory Visit (INDEPENDENT_AMBULATORY_CARE_PROVIDER_SITE_OTHER): Payer: No Typology Code available for payment source | Admitting: Family Medicine

## 2012-11-26 VITALS — BP 128/81 | HR 92 | Ht 68.0 in | Wt 176.0 lb

## 2012-11-26 DIAGNOSIS — F10239 Alcohol dependence with withdrawal, unspecified: Secondary | ICD-10-CM

## 2012-11-26 DIAGNOSIS — F10939 Alcohol use, unspecified with withdrawal, unspecified: Secondary | ICD-10-CM

## 2012-11-26 DIAGNOSIS — I509 Heart failure, unspecified: Secondary | ICD-10-CM

## 2012-11-26 DIAGNOSIS — J690 Pneumonitis due to inhalation of food and vomit: Secondary | ICD-10-CM

## 2012-11-26 DIAGNOSIS — R569 Unspecified convulsions: Secondary | ICD-10-CM

## 2012-11-26 DIAGNOSIS — I5040 Unspecified combined systolic (congestive) and diastolic (congestive) heart failure: Secondary | ICD-10-CM

## 2012-11-26 LAB — BASIC METABOLIC PANEL
CO2: 27 mEq/L (ref 19–32)
Chloride: 96 mEq/L (ref 96–112)
Creat: 0.92 mg/dL (ref 0.50–1.35)
Glucose, Bld: 114 mg/dL — ABNORMAL HIGH (ref 70–99)
Potassium: 4.9 mEq/L (ref 3.5–5.3)
Sodium: 131 mEq/L — ABNORMAL LOW (ref 135–145)

## 2012-11-26 NOTE — Assessment & Plan Note (Addendum)
No more seizures reported since discharge. Pt still taking Clonazepam, discussed with pt, and under his consent, with his wife (and primary caregiver) the potential side effects and dangerous withdraw symptoms. Pt and wife voiced understanding. He states will f/u with Psychiatry in order to better control his anxiety symptoms that triggers alcohol consumption.

## 2012-11-26 NOTE — Patient Instructions (Signed)
It has been a pleasure to see you today. Please take the medications as prescribed. I will call you with the labs results if they come back abnormal and I will also mail you this results.  Make your next appointment in one month, to followup your kidney function and your potassium level.

## 2012-11-26 NOTE — Assessment & Plan Note (Signed)
Completed abx course, reports only occasional non productive cough, normal O2 sats and physical exam reassuring.

## 2012-11-26 NOTE — Progress Notes (Signed)
Family Medicine Office Visit Note   Subjective:   Patient ID: Juan [name], male  DOB: Feb 01, 1951, 62 y.o.. MRN: [id number]   Pt that comes today for hospital followup. He was admitted with alcohol withdrawal seizure, aspiration pneumonia and worsening of CHF. Patient was started on several medications including Aldactone which needs close monitoring.  Due to risk of arrhythmia patient was instructed to wear a life vest and followup with cardiology in one week after hospital d/c. Pt reports he already had appointment with Cardiology last Tuesday and was instructed to continue with life vest and no changes in present treatmnet was made.   He reports feeling better, denies fever, chills, SOB and only has occasional dry cough without sputum production.  He denies the use of alcohol since his recent hospitalization and denies any other withdraw symptoms/seizure. He did continue Clonazepam regardless discharge instructions reporting this helps with his Psychiatric problems of OCD and Anxiety, he states taking up to 2 tabs a day and will request refill of this medication from his Psychiatrist  Review of Systems:  Per HPI, also denies chest pain, palpitations, headaches, dizziness, numbness or weakness. No changes on urinary or BM habits. Reports weight has been stable.  Objective:   Physical Exam: Gen:  NAD HEENT: Moist mucous membranes  CV: Regular rate and rhythm, no murmurs rubs or gallops PULM: Clear to auscultation bilaterally. No wheezes/rales/rhonchi ABD: Soft, non tender, non distended, normal bowel sounds EXT: No edema Neuro: Alert and oriented x3. No focalization  Assessment & Plan:

## 2012-11-26 NOTE — Assessment & Plan Note (Signed)
Pt on aldactone. K and Cr monitoring is indicated. Will obtain BMET today and next will be monthly for 3 months, then every 3 months.

## 2012-11-27 ENCOUNTER — Encounter: Payer: Self-pay | Admitting: Family Medicine

## 2012-12-01 ENCOUNTER — Telehealth: Payer: Self-pay | Admitting: Internal Medicine

## 2012-12-01 MED ORDER — POTASSIUM CHLORIDE CRYS ER 20 MEQ PO TBCR: 40.0000 meq | EXTENDED_RELEASE_TABLET | Freq: Every day | ORAL | Status: DC

## 2012-12-01 NOTE — Telephone Encounter (Signed)
Returned emergency contact - wife's call - refilled potassium

## 2012-12-01 NOTE — Telephone Encounter (Signed)
Please call question about his medication! °

## 2012-12-20 ENCOUNTER — Other Ambulatory Visit: Payer: Self-pay | Admitting: Family Medicine

## 2013-01-20 ENCOUNTER — Telehealth (HOSPITAL_COMMUNITY): Payer: Self-pay | Admitting: *Deleted

## 2013-03-01 ENCOUNTER — Ambulatory Visit (HOSPITAL_COMMUNITY): Payer: No Typology Code available for payment source

## 2013-03-09 ENCOUNTER — Ambulatory Visit: Payer: No Typology Code available for payment source | Admitting: Internal Medicine

## 2013-03-22 ENCOUNTER — Ambulatory Visit (HOSPITAL_COMMUNITY): Payer: No Typology Code available for payment source

## 2013-03-24 ENCOUNTER — Telehealth (HOSPITAL_COMMUNITY): Payer: Self-pay | Admitting: *Deleted

## 2013-03-30 ENCOUNTER — Ambulatory Visit (HOSPITAL_COMMUNITY): Payer: No Typology Code available for payment source

## 2013-04-05 ENCOUNTER — Ambulatory Visit: Payer: No Typology Code available for payment source | Admitting: Internal Medicine

## 2013-04-20 ENCOUNTER — Ambulatory Visit (HOSPITAL_COMMUNITY): Payer: No Typology Code available for payment source

## 2013-04-27 ENCOUNTER — Ambulatory Visit: Payer: No Typology Code available for payment source | Admitting: Internal Medicine

## 2013-04-30 ENCOUNTER — Telehealth: Payer: Self-pay | Admitting: *Deleted

## 2013-04-30 NOTE — Telephone Encounter (Signed)
Faxed to [facility] - prescription renewal with last OV note

## 2013-05-27 ENCOUNTER — Other Ambulatory Visit: Payer: Self-pay | Admitting: Family Medicine

## 2013-06-13 ENCOUNTER — Encounter (HOSPITAL_COMMUNITY): Payer: Self-pay | Admitting: Emergency Medicine

## 2013-06-13 ENCOUNTER — Emergency Department (HOSPITAL_COMMUNITY): Payer: No Typology Code available for payment source

## 2013-06-13 ENCOUNTER — Inpatient Hospital Stay (HOSPITAL_COMMUNITY)
Admission: EM | Admit: 2013-06-13 | Discharge: 2013-06-17 | DRG: 641 | Disposition: A | Discharge status: Home / Self Care | Payer: No Typology Code available for payment source | Attending: Internal Medicine | Admitting: Internal Medicine

## 2013-06-13 ENCOUNTER — Inpatient Hospital Stay (HOSPITAL_COMMUNITY): Payer: No Typology Code available for payment source

## 2013-06-13 DIAGNOSIS — F411 Generalized anxiety disorder: Secondary | ICD-10-CM | POA: Diagnosis present

## 2013-06-13 DIAGNOSIS — R911 Solitary pulmonary nodule: Secondary | ICD-10-CM

## 2013-06-13 DIAGNOSIS — K429 Umbilical hernia without obstruction or gangrene: Secondary | ICD-10-CM | POA: Diagnosis present

## 2013-06-13 DIAGNOSIS — R7401 Elevation of levels of liver transaminase levels: Secondary | ICD-10-CM | POA: Diagnosis present

## 2013-06-13 DIAGNOSIS — F32A Depression, unspecified: Secondary | ICD-10-CM | POA: Diagnosis present

## 2013-06-13 DIAGNOSIS — R740 Nonspecific elevation of levels of transaminase and lactic acid dehydrogenase [LDH]: Secondary | ICD-10-CM

## 2013-06-13 DIAGNOSIS — R259 Unspecified abnormal involuntary movements: Secondary | ICD-10-CM | POA: Diagnosis present

## 2013-06-13 DIAGNOSIS — Z8249 Family history of ischemic heart disease and other diseases of the circulatory system: Secondary | ICD-10-CM

## 2013-06-13 DIAGNOSIS — I5022 Chronic systolic (congestive) heart failure: Secondary | ICD-10-CM | POA: Diagnosis present

## 2013-06-13 DIAGNOSIS — R7402 Elevation of levels of lactic acid dehydrogenase (LDH): Secondary | ICD-10-CM | POA: Diagnosis present

## 2013-06-13 DIAGNOSIS — Z79899 Other long term (current) drug therapy: Secondary | ICD-10-CM

## 2013-06-13 DIAGNOSIS — B354 Tinea corporis: Secondary | ICD-10-CM | POA: Diagnosis present

## 2013-06-13 DIAGNOSIS — J45909 Unspecified asthma, uncomplicated: Secondary | ICD-10-CM | POA: Diagnosis present

## 2013-06-13 DIAGNOSIS — D649 Anemia, unspecified: Secondary | ICD-10-CM | POA: Diagnosis present

## 2013-06-13 DIAGNOSIS — Z720 Tobacco use: Secondary | ICD-10-CM | POA: Diagnosis present

## 2013-06-13 DIAGNOSIS — Z820 Family history of epilepsy and other diseases of the nervous system: Secondary | ICD-10-CM

## 2013-06-13 DIAGNOSIS — F17200 Nicotine dependence, unspecified, uncomplicated: Secondary | ICD-10-CM | POA: Diagnosis present

## 2013-06-13 DIAGNOSIS — F3289 Other specified depressive episodes: Secondary | ICD-10-CM | POA: Diagnosis present

## 2013-06-13 DIAGNOSIS — F419 Anxiety disorder, unspecified: Secondary | ICD-10-CM | POA: Diagnosis present

## 2013-06-13 DIAGNOSIS — R21 Rash and other nonspecific skin eruption: Secondary | ICD-10-CM | POA: Diagnosis present

## 2013-06-13 DIAGNOSIS — F329 Major depressive disorder, single episode, unspecified: Secondary | ICD-10-CM | POA: Diagnosis present

## 2013-06-13 DIAGNOSIS — Z833 Family history of diabetes mellitus: Secondary | ICD-10-CM

## 2013-06-13 DIAGNOSIS — E871 Hypo-osmolality and hyponatremia: Principal | ICD-10-CM | POA: Diagnosis present

## 2013-06-13 DIAGNOSIS — Z23 Encounter for immunization: Secondary | ICD-10-CM

## 2013-06-13 DIAGNOSIS — I509 Heart failure, unspecified: Secondary | ICD-10-CM | POA: Diagnosis present

## 2013-06-13 DIAGNOSIS — Z7982 Long term (current) use of aspirin: Secondary | ICD-10-CM

## 2013-06-13 DIAGNOSIS — F1020 Alcohol dependence, uncomplicated: Secondary | ICD-10-CM | POA: Diagnosis present

## 2013-06-13 DIAGNOSIS — Z9181 History of falling: Secondary | ICD-10-CM

## 2013-06-13 DIAGNOSIS — I10 Essential (primary) hypertension: Secondary | ICD-10-CM | POA: Diagnosis present

## 2013-06-13 DIAGNOSIS — D72829 Elevated white blood cell count, unspecified: Secondary | ICD-10-CM | POA: Diagnosis present

## 2013-06-13 LAB — HEPATITIS PANEL, ACUTE
HCV Ab: NEGATIVE
Hep A IgM: NONREACTIVE
Hep B C IgM: NONREACTIVE
Hepatitis B Surface Ag: NEGATIVE

## 2013-06-13 LAB — NA AND K (SODIUM & POTASSIUM), RAND UR
POTASSIUM UR: 43 meq/L
SODIUM UR: 38 meq/L

## 2013-06-13 LAB — URINALYSIS, ROUTINE W REFLEX MICROSCOPIC
BILIRUBIN URINE: NEGATIVE
Glucose, UA: NEGATIVE mg/dL
Ketones, ur: NEGATIVE mg/dL
Leukocytes, UA: NEGATIVE
Nitrite: NEGATIVE
PH: 6.5 (ref 5.0–8.0)
Protein, ur: 30 mg/dL — AB
Specific Gravity, Urine: 1.018 (ref 1.005–1.030)
Urobilinogen, UA: 0.2 mg/dL (ref 0.0–1.0)

## 2013-06-13 LAB — BASIC METABOLIC PANEL
BUN: 13 mg/dL (ref 6–23)
BUN: 12 mg/dL (ref 6–23)
BUN: 11 mg/dL (ref 6–23)
BUN: 11 mg/dL (ref 6–23)
CALCIUM: 8.2 mg/dL — AB (ref 8.4–10.5)
CALCIUM: 8.6 mg/dL (ref 8.4–10.5)
CHLORIDE: 79 meq/L — AB (ref 96–112)
CO2: 20 mEq/L (ref 19–32)
CO2: 21 mEq/L (ref 19–32)
CO2: 23 meq/L (ref 19–32)
CO2: 22 mEq/L (ref 19–32)
CREATININE: 0.68 mg/dL (ref 0.50–1.35)
Calcium: 8.7 mg/dL (ref 8.4–10.5)
Calcium: 8.3 mg/dL — ABNORMAL LOW (ref 8.4–10.5)
Chloride: 78 mEq/L — ABNORMAL LOW (ref 96–112)
Chloride: 81 mEq/L — ABNORMAL LOW (ref 96–112)
Chloride: 81 mEq/L — ABNORMAL LOW (ref 96–112)
Creatinine, Ser: 0.69 mg/dL (ref 0.50–1.35)
Creatinine, Ser: 0.76 mg/dL (ref 0.50–1.35)
Creatinine, Ser: 0.74 mg/dL (ref 0.50–1.35)
GFR calc Af Amer: >90 mL/min (ref >90)
GFR calc Af Amer: >90 mL/min (ref >90)
GFR calc non Af Amer: >90 mL/min (ref >90)
GFR calc non Af Amer: >90 mL/min (ref >90)
Glucose, Bld: 123 mg/dL — ABNORMAL HIGH (ref 70–99)
Glucose, Bld: 140 mg/dL — ABNORMAL HIGH (ref 70–99)
Glucose, Bld: 105 mg/dL — ABNORMAL HIGH (ref 70–99)
Glucose, Bld: 118 mg/dL — ABNORMAL HIGH (ref 70–99)
POTASSIUM: 4.8 meq/L (ref 3.7–5.3)
POTASSIUM: 4.6 meq/L (ref 3.7–5.3)
Potassium: 4.6 mEq/L (ref 3.7–5.3)
Potassium: 5 mEq/L (ref 3.7–5.3)
SODIUM: 115 meq/L — AB (ref 137–147)
SODIUM: 115 meq/L — AB (ref 137–147)
Sodium: 112 mEq/L — CL (ref 137–147)
Sodium: 113 mEq/L — CL (ref 137–147)

## 2013-06-13 LAB — CBC WITH DIFFERENTIAL/PLATELET
Basophils Absolute: 0 10*3/uL (ref 0.0–0.1)
Basophils Relative: 0 % (ref 0–1)
EOS ABS: 0.1 10*3/uL (ref 0.0–0.7)
Eosinophils Relative: 1 % (ref 0–5)
HCT: 32.6 % — ABNORMAL LOW (ref 39.0–52.0)
Hemoglobin: 12.3 g/dL — ABNORMAL LOW (ref 13.0–17.0)
LYMPHS PCT: 7 % — AB (ref 12–46)
Lymphs Abs: 1 10*3/uL (ref 0.7–4.0)
MCH: 31.8 pg (ref 26.0–34.0)
MCHC: 37.7 g/dL — ABNORMAL HIGH (ref 30.0–36.0)
MCV: 84.2 fL (ref 78.0–100.0)
Monocytes Absolute: 0.7 10*3/uL (ref 0.1–1.0)
Monocytes Relative: 5 % (ref 3–12)
NEUTROS PCT: 88 % — AB (ref 43–77)
Neutro Abs: 13 10*3/uL — ABNORMAL HIGH (ref 1.7–7.7)
Platelets: 296 10*3/uL (ref 150–400)
RBC: 3.87 MIL/uL — AB (ref 4.22–5.81)
RDW: 12.5 % (ref 11.5–15.5)
WBC: 14.7 10*3/uL — AB (ref 4.0–10.5)

## 2013-06-13 LAB — COMPREHENSIVE METABOLIC PANEL
ALBUMIN: 3.8 g/dL (ref 3.5–5.2)
ALT: 65 U/L — AB (ref 0–53)
AST: 145 U/L — ABNORMAL HIGH (ref 0–37)
Alkaline Phosphatase: 109 U/L (ref 39–117)
BUN: 15 mg/dL (ref 6–23)
CO2: 21 mEq/L (ref 19–32)
Calcium: 8.4 mg/dL (ref 8.4–10.5)
Chloride: 72 mEq/L — ABNORMAL LOW (ref 96–112)
Creatinine, Ser: 0.79 mg/dL (ref 0.50–1.35)
GFR calc non Af Amer: >90 mL/min (ref >90)
GLUCOSE: 149 mg/dL — AB (ref 70–99)
Potassium: 5.2 mEq/L (ref 3.7–5.3)
Sodium: 109 mEq/L — CL (ref 137–147)
TOTAL PROTEIN: 7.7 g/dL (ref 6.0–8.3)
Total Bilirubin: 0.7 mg/dL (ref 0.3–1.2)

## 2013-06-13 LAB — APTT: APTT: 34 s (ref 24–37)

## 2013-06-13 LAB — PROTIME-INR
INR: 0.99 (ref 0.00–1.49)
Prothrombin Time: 12.9 seconds (ref 11.6–15.2)

## 2013-06-13 LAB — OSMOLALITY: OSMOLALITY: 235 mosm/kg — AB (ref 275–300)

## 2013-06-13 LAB — RAPID URINE DRUG SCREEN, HOSP PERFORMED
Amphetamines: NOT DETECTED
BENZODIAZEPINES: POSITIVE — AB
Barbiturates: NOT DETECTED
COCAINE: NOT DETECTED
Opiates: NOT DETECTED
TETRAHYDROCANNABINOL: NOT DETECTED

## 2013-06-13 LAB — MRSA PCR SCREENING: MRSA by PCR: POSITIVE — AB

## 2013-06-13 LAB — TROPONIN I: Troponin I: <0.3 ng/mL (ref <0.30)

## 2013-06-13 LAB — URINE MICROSCOPIC-ADD ON

## 2013-06-13 LAB — TSH: TSH: 1.53 u[IU]/mL (ref 0.350–4.500)

## 2013-06-13 LAB — ETHANOL: Alcohol, Ethyl (B): <11 mg/dL (ref 0–11)

## 2013-06-13 LAB — MAGNESIUM: MAGNESIUM: 1.8 mg/dL (ref 1.5–2.5)

## 2013-06-13 LAB — CK: Total CK: 4193 U/L — ABNORMAL HIGH (ref 7–232)

## 2013-06-13 LAB — OSMOLALITY, URINE: OSMOLALITY UR: 352 mosm/kg — AB (ref 390–1090)

## 2013-06-13 MED ORDER — LISINOPRIL 20 MG PO TABS
20.0000 mg | ORAL_TABLET | Freq: Every day | ORAL | Status: DC
  Administered 2013-06-14 – 2013-06-17 (×4): 20 mg via ORAL
  Filled 2013-06-13 (×4): qty 1

## 2013-06-13 MED ORDER — CARVEDILOL 6.25 MG PO TABS
6.2500 mg | ORAL_TABLET | Freq: Two times a day (BID) | ORAL | Status: DC
  Administered 2013-06-13 – 2013-06-17 (×8): 6.25 mg via ORAL
  Filled 2013-06-13 (×10): qty 1

## 2013-06-13 MED ORDER — MUPIROCIN 2 % EX OINT
1.0000 "application " | TOPICAL_OINTMENT | Freq: Two times a day (BID) | CUTANEOUS | Status: DC
  Administered 2013-06-13 – 2013-06-17 (×8): 1 via NASAL
  Filled 2013-06-13: qty 22

## 2013-06-13 MED ORDER — FLUTICASONE PROPIONATE 50 MCG/ACT NA SUSP
2.0000 | Freq: Every day | NASAL | Status: DC
  Administered 2013-06-13 – 2013-06-17 (×5): 2 via NASAL
  Filled 2013-06-13: qty 16

## 2013-06-13 MED ORDER — FOLIC ACID 1 MG PO TABS
1.0000 mg | ORAL_TABLET | Freq: Every day | ORAL | Status: DC
  Administered 2013-06-13 – 2013-06-17 (×5): 1 mg via ORAL
  Filled 2013-06-13 (×5): qty 1

## 2013-06-13 MED ORDER — LORAZEPAM 1 MG PO TABS: 1.0000 mg | ORAL_TABLET | Freq: Four times a day (QID) | ORAL | Status: AC | PRN

## 2013-06-13 MED ORDER — ACETAMINOPHEN 325 MG PO TABS
650.0000 mg | ORAL_TABLET | Freq: Four times a day (QID) | ORAL | Status: DC | PRN
  Administered 2013-06-14: 650 mg via ORAL
  Filled 2013-06-13: qty 2

## 2013-06-13 MED ORDER — PNEUMOCOCCAL VAC POLYVALENT 25 MCG/0.5ML IJ INJ
0.5000 mL | INJECTION | INTRAMUSCULAR | Status: AC
  Administered 2013-06-14: 0.5 mL via INTRAMUSCULAR
  Filled 2013-06-13 (×2): qty 0.5

## 2013-06-13 MED ORDER — SODIUM CHLORIDE 0.9 % IV SOLN
INTRAVENOUS | Status: DC
  Administered 2013-06-13: 10:00:00 via INTRAVENOUS

## 2013-06-13 MED ORDER — MIRTAZAPINE 30 MG PO TABS
30.0000 mg | ORAL_TABLET | Freq: Every day | ORAL | Status: DC
  Administered 2013-06-13 – 2013-06-16 (×4): 30 mg via ORAL
  Filled 2013-06-13 (×5): qty 1

## 2013-06-13 MED ORDER — ASPIRIN EC 81 MG PO TBEC
81.0000 mg | DELAYED_RELEASE_TABLET | Freq: Every day | ORAL | Status: DC
  Administered 2013-06-13 – 2013-06-17 (×5): 81 mg via ORAL
  Filled 2013-06-13 (×5): qty 1

## 2013-06-13 MED ORDER — SODIUM CHLORIDE 0.9 % IV BOLUS (SEPSIS)
1000.0000 mL | Freq: Once | INTRAVENOUS | Status: AC
  Administered 2013-06-13: 1000 mL via INTRAVENOUS

## 2013-06-13 MED ORDER — ADULT MULTIVITAMIN W/MINERALS CH
1.0000 | ORAL_TABLET | Freq: Every day | ORAL | Status: DC
  Administered 2013-06-13 – 2013-06-17 (×5): 1 via ORAL
  Filled 2013-06-13 (×5): qty 1

## 2013-06-13 MED ORDER — TIOTROPIUM BROMIDE MONOHYDRATE 18 MCG IN CAPS
18.0000 ug | ORAL_CAPSULE | Freq: Every day | RESPIRATORY_TRACT | Status: DC
  Administered 2013-06-13 – 2013-06-17 (×5): 18 ug via RESPIRATORY_TRACT
  Filled 2013-06-13: qty 5

## 2013-06-13 MED ORDER — LORAZEPAM 2 MG/ML IJ SOLN
1.0000 mg | Freq: Four times a day (QID) | INTRAMUSCULAR | Status: AC | PRN
  Administered 2013-06-13: 1 mg via INTRAVENOUS
  Filled 2013-06-13: qty 1

## 2013-06-13 MED ORDER — SODIUM CHLORIDE 0.9 % IJ SOLN
3.0000 mL | Freq: Two times a day (BID) | INTRAMUSCULAR | Status: DC
  Administered 2013-06-13 – 2013-06-17 (×3): 3 mL via INTRAVENOUS

## 2013-06-13 MED ORDER — ONDANSETRON HCL 4 MG/2ML IJ SOLN: 4.0000 mg | Freq: Four times a day (QID) | INTRAMUSCULAR | Status: DC | PRN

## 2013-06-13 MED ORDER — ACETAMINOPHEN 650 MG RE SUPP: 650.0000 mg | Freq: Four times a day (QID) | RECTAL | Status: DC | PRN

## 2013-06-13 MED ORDER — ALBUTEROL SULFATE (2.5 MG/3ML) 0.083% IN NEBU: 2.5000 mg | INHALATION_SOLUTION | RESPIRATORY_TRACT | Status: DC | PRN

## 2013-06-13 MED ORDER — THIAMINE HCL 100 MG/ML IJ SOLN
100.0000 mg | Freq: Every day | INTRAMUSCULAR | Status: DC
  Filled 2013-06-13 (×5): qty 1

## 2013-06-13 MED ORDER — VITAMIN B-1 100 MG PO TABS
100.0000 mg | ORAL_TABLET | Freq: Every day | ORAL | Status: DC
  Administered 2013-06-13 – 2013-06-17 (×5): 100 mg via ORAL
  Filled 2013-06-13 (×5): qty 1

## 2013-06-13 MED ORDER — SERTRALINE HCL 50 MG PO TABS
150.0000 mg | ORAL_TABLET | Freq: Every day | ORAL | Status: DC
  Administered 2013-06-14 – 2013-06-17 (×4): 150 mg via ORAL
  Filled 2013-06-13 (×4): qty 1

## 2013-06-13 MED ORDER — CHLORHEXIDINE GLUCONATE CLOTH 2 % EX PADS
6.0000 | MEDICATED_PAD | Freq: Every day | CUTANEOUS | Status: DC
  Administered 2013-06-14 – 2013-06-17 (×4): 6 via TOPICAL

## 2013-06-13 MED ORDER — TRIAMCINOLONE ACETONIDE 0.1 % EX CREA
TOPICAL_CREAM | Freq: Two times a day (BID) | CUTANEOUS | Status: DC
  Administered 2013-06-13 – 2013-06-17 (×9): via TOPICAL
  Filled 2013-06-13 (×2): qty 15

## 2013-06-13 MED ORDER — NYSTATIN 100000 UNIT/GM EX POWD
Freq: Two times a day (BID) | CUTANEOUS | Status: DC
  Administered 2013-06-13 – 2013-06-17 (×9): via TOPICAL
  Filled 2013-06-13: qty 15

## 2013-06-13 MED ORDER — NICOTINE 14 MG/24HR TD PT24
14.0000 mg | MEDICATED_PATCH | Freq: Every day | TRANSDERMAL | Status: DC
Start: 2013-06-13 — End: 2013-06-17
  Administered 2013-06-13 – 2013-06-17 (×5): 14 mg via TRANSDERMAL
  Filled 2013-06-13 (×5): qty 1

## 2013-06-13 MED ORDER — SODIUM CHLORIDE 0.9 % IV SOLN
INTRAVENOUS | Status: DC
  Administered 2013-06-13 – 2013-06-16 (×4): via INTRAVENOUS

## 2013-06-13 MED ORDER — LORAZEPAM 2 MG/ML IJ SOLN
0.0000 mg | Freq: Four times a day (QID) | INTRAMUSCULAR | Status: AC
  Administered 2013-06-13: 2 mg via INTRAVENOUS
  Administered 2013-06-15: 1 mg via INTRAVENOUS
  Filled 2013-06-13 (×2): qty 1

## 2013-06-13 MED ORDER — LORAZEPAM 2 MG/ML IJ SOLN: 0.0000 mg | Freq: Two times a day (BID) | INTRAMUSCULAR | Status: AC

## 2013-06-13 MED ORDER — ONDANSETRON HCL 4 MG PO TABS: 4.0000 mg | ORAL_TABLET | Freq: Four times a day (QID) | ORAL | Status: DC | PRN

## 2013-06-13 NOTE — ED Notes (Signed)
Wife voices concern about staff hydrating pt too much. She mentions an experience pt had before where he was dehydrated then she states "got hydrated too much".

## 2013-06-13 NOTE — H&P (Signed)
[facility] History and Physical  Juan [name] ZCH:[id number] DOB: 08-27-1950 DOA: 06/13/2013   PCP: Annabell Sabal, MD  Specialists: His cardiologist is Dr. Debara Pickett. However, he hasn't seen him in the last 8 months or so. He missed an appointment. He's also followed by a psychiatrist for his depression and anxiety.  Chief Complaint: Weakness since the last few days with frequent falls  HPI: Juan [name] is a 63 y.o. male with a past medical history of alcoholism, congestive heart failure, depression, and anxiety, who was in his usual state of health till about a week ago, when he started feeling weak, dizzy. Denies any syncopal episode. No seizure-type activities although he's had tremors. According to his wife he has had multiple falls without any head injuries or other injuries in the last one week. He denies any shortness of breath that is more than usual. No chest pain. He has had some nausea and vomiting about a week to 2 weeks ago, but none recently. Denies any diarrhea. No headaches. Last night he felt weak and 'his legs gave out'. He couldn't get back into the house and spent the night at the porch. He is able to move his legs. Over the last one week he must have had about 40 ounces of bourbon. He also has wine with dinner. Denies beer intake. He drinks quite heavily otherwise and over the last one week he has been trying to cut down. He also tells me that over the last few months or so his psychiatrist has been monitoring his sodium levels. Last check was about 2-3 weeks ago and sodium was 123. Patient was initially on Zoloft, which was discontinued due to low sodium and he was placed perhaps on Effexor. About 2-3 weeks ago he was changed back to Zoloft. He's also on diuretics, which he takes on a regular basis. Denies any other medication changes. Does report poor oral intake.  Home Medications: Prior to Admission medications   Medication Sig Start Date End Date Taking?  Authorizing Provider  acetaminophen (TYLENOL) 500 MG tablet Take 500-1,500 mg by mouth every 6 (six) hours as needed for headache.   Yes Historical Provider, MD  aspirin EC 81 MG EC tablet Take 1 tablet (81 mg total) by mouth daily. 11/13/12  Yes Janece Canterbury, MD  atorvastatin (LIPITOR) 40 MG tablet Take 1 tablet (40 mg total) by mouth daily at 6 PM. 11/24/12  Yes Pixie Casino, MD  B-Complex CAPS Take 1 capsule by mouth daily.   Yes Historical Provider, MD  carvedilol (COREG) 6.25 MG tablet Take 1 tablet (6.25 mg total) by mouth 2 (two) times daily with a meal. 11/24/12  Yes Pixie Casino, MD  Cholecalciferol (VITAMIN D-3) 1000 UNITS CAPS Take 1,000 Units by mouth daily.   Yes Historical Provider, MD  clonazePAM (KLONOPIN) 0.5 MG tablet Take 0.5 mg by mouth 2 (two) times daily as needed for anxiety.   Yes Historical Provider, MD  Ephedrine-Guaifenesin (PRIMATENE ASTHMA) 12.5-200 MG TABS Take 1-3 tablets by mouth daily as needed.    Yes Historical Provider, MD  fexofenadine (ALLEGRA) 180 MG tablet Take 180 mg by mouth daily.   Yes Historical Provider, MD  fluticasone (FLONASE) 50 MCG/ACT nasal spray Place 2 sprays into the nose daily.   Yes Historical Provider, MD  furosemide (LASIX) 40 MG tablet Take 1 tablet (40 mg total) by mouth daily. 11/24/12  Yes Pixie Casino, MD  ipratropium (ATROVENT HFA) 17 MCG/ACT inhaler Inhale 2 puffs into the  lungs 3 (three) times daily as needed for wheezing.   Yes Historical Provider, MD  lisinopril (PRINIVIL,ZESTRIL) 20 MG tablet Take 1 tablet (20 mg total) by mouth daily. 11/24/12  Yes Pixie Casino, MD  magnesium oxide (MAG-OX) 400 MG tablet Take 800 mg by mouth daily.   Yes Historical Provider, MD  mirtazapine (REMERON) 30 MG tablet Take 30 mg by mouth at bedtime.   Yes Historical Provider, MD  Multiple Vitamin (MULTIVITAMIN WITH MINERALS) TABS tablet Take 1 tablet by mouth daily.   Yes Historical Provider, MD  neomycin-bacitracin-polymyxin  (NEOSPORIN) ointment Apply 1 application topically as needed for wound care. apply to eye   Yes Historical Provider, MD  potassium chloride SA (K-DUR,KLOR-CON) 20 MEQ tablet Take 2 tablets (40 mEq total) by mouth daily. 12/01/12  Yes Pixie Casino, MD  sertraline (ZOLOFT) 100 MG tablet Take 150 mg by mouth daily.   Yes Historical Provider, MD  spironolactone (ALDACTONE) 12.5 mg TABS tablet Take 0.5 tablets (12.5 mg total) by mouth daily. 11/24/12  Yes Pixie Casino, MD    Allergies: No Known Allergies  Past Medical History: Past Medical History  Diagnosis Date  . CHF (congestive heart failure) 10/2010    ECHO:  EF 40%, Grade II diastolic dysfunction  . Alcoholism   . Allergy     seasonal   . Anxiety   . Depression   . Asthma   . Hyperlipidemia   . Hypertension   . Seizures     Past Surgical History  Procedure Laterality Date  . Removal of ingrown toenail      Social History: Lives with his wife in [place]. Smokes one pack of cigarettes on a daily basis. Alcohol use has been discussed earlier. Denies any recreational drug use. Usually independent with daily activities, but has been weak the last few weeks.  Family History:  Family History  Problem Relation Age of Onset  . Alzheimer's disease Mother   . Diabetes Mother   . Heart disease Maternal Uncle      Review of Systems - History obtained from the patient General ROS: positive for  - fatigue Psychological ROS: positive for - anxiety Ophthalmic ROS: negative ENT ROS: negative Allergy and Immunology ROS: positive for - postnasal drip Hematological and Lymphatic ROS: negative Endocrine ROS: negative Respiratory ROS: no cough, shortness of breath, or wheezing Cardiovascular ROS: no chest pain or dyspnea on exertion Gastrointestinal ROS: as in hpi. Also has umbilical hernia Genito-Urinary ROS: no dysuria, trouble voiding, or hematuria Musculoskeletal ROS: negative Neurological ROS: no TIA or stroke  symptoms Dermatological ROS: rash in LE  Physical Examination  Filed Vitals:   06/13/13 1028 06/13/13 1030 06/13/13 1045 06/13/13 1115  BP: 133/60 129/66 124/71 143/73  Pulse: 90 89 90 89  Temp:      TempSrc:      Resp: _0 Height:      Weight:      SpO2: 100% 100% 100% 99%    BP 143/73  Pulse 89  Temp(Src) 97.8 F (36.6 C) (Oral)  Resp 18  Ht _1  (1.727 m)  Wt 88.451 kg (195 lb)  BMI 29.66 kg/m2  SpO2 99%  General appearance: alert, cooperative, appears stated age, distracted and no distress Head: Normocephalic, without obvious abnormality, atraumatic Eyes: conjunctivae/corneas clear. PERRL, EOM's intact. Throat: dry mm Neck: no adenopathy, no carotid bruit, no JVD, supple, symmetrical, trachea midline and thyroid not enlarged, symmetric, no tenderness/mass/nodules Back: symmetric, no curvature. ROM normal.  No CVA tenderness. Resp: clear to auscultation bilaterally Cardio: regular rate and rhythm, S1, S2 normal, no murmur, click, rub or gallop GI:   abdomeen is soft. Nontender, nondistended. Umbilical hernia is noted and is significantly large, but unchanged according to the patient. It is nnonntennder and not reducible. There is some excoriation over the skin. Extremities: extremities normal, atraumatic, no cyanosis or edema Pulses: 2+ and symmetric Skin: There is evidence for yeast infection in the lower abdominal folds. He also has erythematous, papular rash over his lower extremity anteriorly. These are suspicious for being psoriatic. Lymph nodes: Cervical, supraclavicular, and axillary nodes normal. Neurologic: Is alert and oriented x3. Cranial nerves are intact 2-12. Motor strength is equal, bilateral upper and lower extremity. He was able to lift both legs off the bed. Gait was not assessed. Slightly tremulous. No pronator drift.  Laboratory Data: Results for orders placed during the hospital encounter of 06/13/13 (from the past 48 hour(s))  CBC WITH  DIFFERENTIAL     Status: Abnormal   Collection Time    06/13/13  9:20 AM      Result Value Ref Range   WBC 14.7 (*) 4.0 - 10.5 K/uL   RBC 3.87 (*) 4.22 - 5.81 MIL/uL   Hemoglobin 12.3 (*) 13.0 - 17.0 g/dL   HCT 32.6 (*) 39.0 - 52.0 %   MCV 84.2  78.0 - 100.0 fL   MCH 31.8  26.0 - 34.0 pg   MCHC 37.7 (*) 30.0 - 36.0 g/dL   Comment: ROU[place]   RDW 12.5  11.5 - 15.5 %   Platelets 296  150 - 400 K/uL   Neutrophils Relative % 88 (*) 43 - 77 %   Neutro Abs 13.0 (*) 1.7 - 7.7 K/uL   Lymphocytes Relative 7 (*) 12 - 46 %   Lymphs Abs 1.0  0.7 - 4.0 K/uL   Monocytes Relative 5  3 - 12 %   Monocytes Absolute 0.7  0.1 - 1.0 K/uL   Eosinophils Relative 1  0 - 5 %   Eosinophils Absolute 0.1  0.0 - 0.7 K/uL   Basophils Relative 0  0 - 1 %   Basophils Absolute 0.0  0.0 - 0.1 K/uL  COMPREHENSIVE METABOLIC PANEL     Status: Abnormal   Collection Time    06/13/13  9:20 AM      Result Value Ref Range   Sodium 109 (*) 137 - 147 mEq/L   Comment: CRITICAL RESULT CALLED TO, READ BACK BY AND VERIFIED WITH:     M.HAMBY RN AT 1009 ON 10MAY15 BY C.BONGEL   Potassium 5.2  3.7 - 5.3 mEq/L   Chloride 72 (*) 96 - 112 mEq/L   CO2 21  19 - 32 mEq/L   Glucose, Bld 149 (*) 70 - 99 mg/dL   BUN 15  6 - 23 mg/dL   Creatinine, Ser 0.79  0.50 - 1.35 mg/dL   Calcium 8.4  8.4 - 10.5 mg/dL   Total Protein 7.7  6.0 - 8.3 g/dL   Albumin 3.8  3.5 - 5.2 g/dL   AST 145 (*) 0 - 37 U/L   ALT 65 (*) 0 - 53 U/L   Alkaline Phosphatase 109  39 - 117 U/L   Total Bilirubin 0.7  0.3 - 1.2 mg/dL   GFR calc non Af Amer >90  >90 mL/min   GFR calc Af Amer >90  >90 mL/min   Comment: (NOTE)     The eGFR has been calculated  using the CKD EPI equation.     This calculation has not been validated in all clinical situations.     eGFR's persistently <90 mL/min signify possible Chronic Kidney     Disease.  TROPONIN I     Status: None   Collection Time    06/13/13  9:20 AM      Result Value Ref Range   Troponin I <0.30  <0.30 ng/mL    Comment:            Due to the release kinetics of cTnI,     a negative result within the first hours     of the onset of symptoms does not rule out     myocardial infarction with certainty.     If myocardial infarction is still suspected,     repeat the test at appropriate intervals.  ETHANOL     Status: None   Collection Time    06/13/13  9:20 AM      Result Value Ref Range   Alcohol, Ethyl (B) <11  0 - 11 mg/dL   Comment:            LOWEST DETECTABLE LIMIT FOR     SERUM ALCOHOL IS 11 mg/dL     FOR MEDICAL PURPOSES ONLY  URINALYSIS, ROUTINE W REFLEX MICROSCOPIC     Status: Abnormal   Collection Time    06/13/13 10:04 AM      Result Value Ref Range   Color, Urine YELLOW  YELLOW   APPearance CLEAR  CLEAR   Specific Gravity, Urine 1.018  1.005 - 1.030   pH 6.5  5.0 - 8.0   Glucose, UA NEGATIVE  NEGATIVE mg/dL   Hgb urine dipstick SMALL (*) NEGATIVE   Bilirubin Urine NEGATIVE  NEGATIVE   Ketones, ur NEGATIVE  NEGATIVE mg/dL   Protein, ur 30 (*) NEGATIVE mg/dL   Urobilinogen, UA 0.2  0.0 - 1.0 mg/dL   Nitrite NEGATIVE  NEGATIVE   Leukocytes, UA NEGATIVE  NEGATIVE  URINE RAPID DRUG SCREEN (HOSP PERFORMED)     Status: Abnormal   Collection Time    06/13/13 10:04 AM      Result Value Ref Range   Opiates NONE DETECTED  NONE DETECTED   Cocaine NONE DETECTED  NONE DETECTED   Benzodiazepines POSITIVE (*) NONE DETECTED   Amphetamines NONE DETECTED  NONE DETECTED   Tetrahydrocannabinol NONE DETECTED  NONE DETECTED   Barbiturates NONE DETECTED  NONE DETECTED   Comment:            DRUG SCREEN FOR MEDICAL PURPOSES     ONLY.  IF CONFIRMATION IS NEEDED     FOR ANY PURPOSE, NOTIFY LAB     WITHIN 5 DAYS.                LOWEST DETECTABLE LIMITS     FOR URINE DRUG SCREEN     Drug Class       Cutoff (ng/mL)     Amphetamine      1000     Barbiturate      200     Benzodiazepine   161     Tricyclics       096     Opiates          300     Cocaine          300     THC              50  URINE MICROSCOPIC-ADD ON     Status: None   Collection Time    06/13/13 10:04 AM      Result Value Ref Range   WBC, UA 0-2  <3 WBC/hpf    Radiology Reports: Dg Chest 2 View  06/13/2013   CLINICAL DATA:  History of CHF asthma and hypertension and smoking history  EXAM: CHEST  2 VIEW  COMPARISON:  DG CHEST 1V PORT dated 11/11/2012  FINDINGS: Hyperinflation suggests COPD. Heart size and vascular pattern are normal. Stable bilateral perihilar bronchial wall thickening and increased markings. Focal interstitial change in the right middle lobe, an area of prior airspace opacity seen on the prior studies.  IMPRESSION: Chronic bronchitic change. Opacity right middle lobe likely due to scarring but would be better characterized with contrast-enhanced CT thorax. .   Electronically Signed   By: Skipper Cliche M.D.   On: 06/13/2013 09:39    Electrocardiogram: Sinus rhythm at 85 beats per minute. Normal axis. Right bundle branch block. Is noted. Intraventricular conduction delay is noted. No concerning ST or T-wave changes are noted.  Problem List  Principal Problem:   Hyponatremia Active Problems:   Depression   Anxiety   Alcoholism   HTN (hypertension)   Chronic systolic heart failure   Tobacco abuse   Transaminitis   Assessment: This is a 63 year old, Caucasian male, who presents with weakness and multiple falls over the last one week and is found to have an extremely low sodium level. However, he hasn't had any seizure activities and his mental status is baseline. He is hemodynamically stable. Chloride is also noted to be low. Reason for hyponatremia is multifactorial, including dehydration, use of diuretics, alcoholism. He also has transaminitis which is secondary to his alcohol. He also has chronic systolic congestive heart failure, which appears to be well compensated at this time.  Plan: #1 severe hyponatremia: Since he is hemodynamically stable and has baseline mental status he can be  admitted to telemetry. He has been given normal saline in the ED. We will check his sodium levels every 3 hours. We will given normal saline, but at a slow rate due to his history of CHF. Urine osmolality and urine sodium levels will be checked. He's also noted to have scarring in his right lung and since he is a smoker he will need further evaluation for same. Check TSH. Avoid rapid correction of sodium levels.  #2 history of alcohol abuse: He'll be maintained on the Ativan protocol. He'll be monitored closely for signs and symptoms of withdrawal. He'll be given thiamine, folate, multivitamins. Magnesium level will be checked. Social worker will be consulted for counseling.  #3 transaminitis: Most likely due to alcohol use. Ultrasound of the abdomen will be obtained. He likely has chronic liver disease due to alcohol. Hepatitis panel will also be ordered.  #4 chronic systolic congestive heart failure: EF was 30-35% in October of 2014. He has been on furosemide and spironolactone, along with ACE inhibitor. He is well compensated at this time. He'll need to be monitored closely for his signs of fluid overload. We will need to hold his diuretics due to severe hyponatremia. He has been told that he needs to be compliant with followups with his cardiologist. Denies any chest pain or worsening shortness of breath.  #5 Scarring in right lung noted on chest x-ray: Since he is a smoker this will need to be evaluated using CT scan. This will be ordered over the course of this hospitalization.  #6  tobacco abuse: Nicotine patch will be prescribed.   #7 tinea in lower abdominal folds: Nystatin powder.  #8 rash over the lower extremities: Very suspicious for Psoriasis. He'll be given triamcinolone cream.   #9 history of depression and anxiety: Continue with his current oral regimen.  #10 Leukocytosis: No obvious evidence for infection. Continue to monitor with daily labs. He is afebrile as well. No need for  antibiotics.  #11 non-reducible umbilical hernia: There is no tenderness. This has been ongoing for at least 2-3 years. He's had surgical evaluation in the past. However due to his CHF surgery was put on hold. Continue to monitor. Apply dressing over the excoriated area.  DVT Prophylaxis: SCDs for now.  Code Status: Full code Family Communication: Discussed with the patient and his wife  Disposition Plan: Admit to telemetry   Further management decisions will depend on results of further testing and patient's response to treatment.   Bonnielee Haff  [facility] Pager [phone]  If 7PM-7AM, please contact night-coverage [email or web address] Password [email or web address]  06/13/2013, 12:02 PM  Disclaimer: This note was dictated with voice recognition software. Similar sounding words can inadvertently be transcribed and may not be corrected upon review.

## 2013-06-13 NOTE — ED Notes (Addendum)
Pt from home via [facility] c/o ETOH intoxication and weakness and nasal congestion. EMS reports pt sat outside all night smoking and drinking. Last drink was upon EMS arrival to home.

## 2013-06-13 NOTE — ED Notes (Signed)
Pt transported to XRAY °

## 2013-06-13 NOTE — Progress Notes (Signed)
CRITICAL VALUE ALERT  Critical value received:  Na 113  Date of notification:  06/13/13  Time of notification:  1630  Critical value read back:yes  Nurse who received alert:  b Shown Dissinger  MD notified (1st page):  Barnie Del  Time of first page:  1645  MD notified (2nd page):  Time of second page:  Responding MD:  Rito Ehrlich  Time MD responded:  [phone]

## 2013-06-13 NOTE — ED Notes (Signed)
hospitalist at bedside

## 2013-06-13 NOTE — ED Notes (Signed)
Attempted to call report to floor 

## 2013-06-13 NOTE — ED Provider Notes (Signed)
CSN: [id number]     Arrival date & time 06/13/13  0850 History   First MD Initiated Contact with Patient 06/13/13 [id number]     Chief Complaint  Patient presents with  . Alcohol Intoxication  . Weakness     (Consider location/radiation/quality/duration/timing/severity/associated sxs/prior Treatment) Patient is a 63 y.o. male presenting with intoxication and weakness. The history is provided by the patient and the EMS personnel.  Alcohol Intoxication  Weakness   patient here complaining of weakness x24 hours. He drinks alcohol daily consisting of liquor. Denies any black or bloody stools. Denies any chest pain abdominal pain. No hematemesis noted. He has attempted to stand up but feels to weak. Denies any headache. Recently was diagnosed with having hyponatremia. He also has a history of COPD but denies any increased cough or congestion. He continues to use tobacco products. His last alcohol use was this morning. Symptoms are better with rest. No treatment used prior to arrival  Past Medical History  Diagnosis Date  . CHF (congestive heart failure) 10/2010    ECHO:  EF 40%, Grade II diastolic dysfunction  . Alcoholism   . Allergy     seasonal   . Anxiety   . Depression   . Asthma   . Hyperlipidemia   . Hypertension   . Seizures    Past Surgical History  Procedure Laterality Date  . Removal of ingrown toenail     Family History  Problem Relation Age of Onset  . Alzheimer's disease Mother   . Diabetes Mother   . Heart disease Maternal Uncle    History  Substance Use Topics  . Smoking status: Current Every Day Smoker -- 1.00 packs/day for 40 years    Types: Cigarettes  . Smokeless tobacco: Never Used     Comment: currently smokes less than 1 ppd  . Alcohol Use: No     Comment: 4-5 burbon's a day for the past 15 years    Review of Systems  Neurological: Positive for weakness.  All other systems reviewed and are negative.     Allergies  Review of patient's allergies  indicates no known allergies.  Home Medications   Prior to Admission medications   Medication Sig Start Date End Date Taking? Authorizing Provider  aspirin EC 81 MG EC tablet Take 1 tablet (81 mg total) by mouth daily. 11/13/12   Renae FickleMackenzie Short, MD  atorvastatin (LIPITOR) 40 MG tablet Take 1 tablet (40 mg total) by mouth daily at 6 PM. 11/24/12   Chrystie NoseKenneth C. Hilty, MD  ATROVENT HFA 17 MCG/ACT inhaler INHALE 2 PUFFS INTO LUNGS 3 TIMES DAILY AS NEEDED FOR WHEEZING 05/27/13   Tobey GrimJeffrey H Walden, MD  beclomethasone (QVAR) 40 MCG/ACT inhaler Inhale 2 puffs into the lungs 2 (two) times daily. 11/13/12   Renae FickleMackenzie Short, MD  carvedilol (COREG) 6.25 MG tablet Take 1 tablet (6.25 mg total) by mouth 2 (two) times daily with a meal. 11/24/12   Chrystie NoseKenneth C. Hilty, MD  clonazePAM (KLONOPIN) 0.5 MG tablet TAKE ONE TABLET BY MOUTH TWICE DAILY FOR ANXIETY  12/20/12   Tobey GrimJeffrey H Walden, MD  desloratadine (CLARINEX) 5 MG tablet Take 1 tablet (5 mg total) by mouth daily. 07/20/12 07/20/13  Tobey GrimJeffrey H Walden, MD  fluticasone (FLONASE) 50 MCG/ACT nasal spray Place 2 sprays into the nose daily.    Historical Provider, MD  furosemide (LASIX) 40 MG tablet Take 1 tablet (40 mg total) by mouth daily. 11/24/12   Chrystie NoseKenneth C. Hilty, MD  lisinopril (PRINIVIL,ZESTRIL) 20  MG tablet Take 1 tablet (20 mg total) by mouth daily. 11/24/12   Chrystie Nose, MD  potassium chloride SA (K-DUR,KLOR-CON) 20 MEQ tablet Take 2 tablets (40 mEq total) by mouth daily. 12/01/12   Chrystie Nose, MD  spironolactone (ALDACTONE) 12.5 mg TABS tablet Take 0.5 tablets (12.5 mg total) by mouth daily. 11/24/12   Chrystie Nose, MD  thiamine 100 MG tablet Take 1 tablet (100 mg total) by mouth daily. 11/13/12   Renae Fickle, MD  venlafaxine XR (EFFEXOR-XR) 150 MG 24 hr capsule Take 150 mg by mouth daily.    Historical Provider, MD   There were no vitals taken for this visit. Physical Exam  Nursing note and vitals reviewed. Constitutional: He is  oriented to person, place, and time. He appears well-developed and well-nourished.  Non-toxic appearance. No distress.  HENT:  Head: Normocephalic and atraumatic.  Eyes: Conjunctivae, EOM and lids are normal. Pupils are equal, round, and reactive to light.  Neck: Normal range of motion. Neck supple. No tracheal deviation present. No mass present.  Cardiovascular: Normal rate, regular rhythm and normal heart sounds.  Exam reveals no gallop.   No murmur heard. Pulmonary/Chest: Effort normal and breath sounds normal. No stridor. No respiratory distress. He has no decreased breath sounds. He has no wheezes. He has no rhonchi. He has no rales.  Abdominal: Soft. Normal appearance and bowel sounds are normal. He exhibits no distension. There is no tenderness. There is no rebound and no CVA tenderness.  Musculoskeletal: Normal range of motion. He exhibits no edema and no tenderness.  Neurological: He is alert and oriented to person, place, and time. He has normal strength. No cranial nerve deficit or sensory deficit. GCS eye subscore is 4. GCS verbal subscore is 5. GCS motor subscore is 6.  Skin: Skin is warm and dry. No abrasion and no rash noted.  Psychiatric: He has a normal mood and affect. His speech is normal and behavior is normal.    ED Course  Procedures (including critical care time) Labs Review Labs Reviewed  CBC WITH DIFFERENTIAL  COMPREHENSIVE METABOLIC PANEL  TROPONIN I  URINALYSIS, ROUTINE W REFLEX MICROSCOPIC  ETHANOL  URINE RAPID DRUG SCREEN (HOSP PERFORMED)    Imaging Review No results found.   EKG Interpretation   Date/Time:  Sunday Jun 13 2013 09:14:29 EDT Ventricular Rate:  85 PR Interval:  213 QRS Duration: 155 QT Interval:  404 QTC Calculation: 480 R Axis:   -129 Text Interpretation:  Sinus rhythm Borderline prolonged PR interval Right  bundle branch block Baseline wander in lead(s) V6 Confirmed by Aaria Happ  MD,  Alicea Wente ([id number]) on 06/13/2013 10:53:49 AM       MDM   Final diagnoses:  None    Patient started on IV hydration here. Will place patient on [place] and admitted to medicine    Toy Baker, MD 06/13/13 1054

## 2013-06-13 NOTE — Progress Notes (Signed)
CRITICAL VALUE ALERT  Critical value received:  Na 112  Date of notification:  06/13/13  Time of notification:  1315  Critical value read back:yes  Nurse who received alert:  Cardell Peach  MD notified (1st page):  Barnie Del  Time of first page:  1316  MD notified (2nd page):  Time of second page:  Responding MD:  Rito Ehrlich  Time MD responded:  1320

## 2013-06-13 NOTE — ED Notes (Signed)
Pt aware of the need for a urine sample. 

## 2013-06-13 NOTE — Progress Notes (Signed)
Pt expressed feeling anxious earlier today and tremors were noted in his hands.  CIWA has been 6, ativan has kept pt calm and comfortable.  Wife at bedside

## 2013-06-13 NOTE — ED Notes (Signed)
Patient was unable to urinate but will try again.

## 2013-06-13 NOTE — ED Notes (Signed)
Bed: WA13 Expected date:  Expected time:  Means of arrival:  Comments: EMS 

## 2013-06-14 ENCOUNTER — Encounter (HOSPITAL_COMMUNITY): Payer: Self-pay | Admitting: Radiology

## 2013-06-14 ENCOUNTER — Inpatient Hospital Stay (HOSPITAL_COMMUNITY): Payer: No Typology Code available for payment source

## 2013-06-14 LAB — COMPREHENSIVE METABOLIC PANEL
ALT: 54 U/L — AB (ref 0–53)
AST: 109 U/L — ABNORMAL HIGH (ref 0–37)
Albumin: 2.9 g/dL — ABNORMAL LOW (ref 3.5–5.2)
Alkaline Phosphatase: 88 U/L (ref 39–117)
BILIRUBIN TOTAL: 0.5 mg/dL (ref 0.3–1.2)
BUN: 10 mg/dL (ref 6–23)
CO2: 21 mEq/L (ref 19–32)
Calcium: 8.3 mg/dL — ABNORMAL LOW (ref 8.4–10.5)
Chloride: 83 mEq/L — ABNORMAL LOW (ref 96–112)
Creatinine, Ser: 0.73 mg/dL (ref 0.50–1.35)
GFR calc Af Amer: >90 mL/min (ref >90)
GFR calc non Af Amer: >90 mL/min (ref >90)
GLUCOSE: 95 mg/dL (ref 70–99)
POTASSIUM: 4.2 meq/L (ref 3.7–5.3)
Sodium: 116 mEq/L — CL (ref 137–147)
Total Protein: 6 g/dL (ref 6.0–8.3)

## 2013-06-14 LAB — GLUCOSE, CAPILLARY
GLUCOSE-CAPILLARY: 97 mg/dL (ref 70–99)
Glucose-Capillary: 112 mg/dL — ABNORMAL HIGH (ref 70–99)
Glucose-Capillary: 110 mg/dL — ABNORMAL HIGH (ref 70–99)
Glucose-Capillary: 186 mg/dL — ABNORMAL HIGH (ref 70–99)

## 2013-06-14 LAB — CBC
HCT: 26.7 % — ABNORMAL LOW (ref 39.0–52.0)
Hemoglobin: 9.8 g/dL — ABNORMAL LOW (ref 13.0–17.0)
MCH: 31.5 pg (ref 26.0–34.0)
MCHC: 36.7 g/dL — AB (ref 30.0–36.0)
MCV: 85.9 fL (ref 78.0–100.0)
PLATELETS: 233 10*3/uL (ref 150–400)
RBC: 3.11 MIL/uL — ABNORMAL LOW (ref 4.22–5.81)
RDW: 12.6 % (ref 11.5–15.5)
WBC: 7.9 10*3/uL (ref 4.0–10.5)

## 2013-06-14 LAB — BASIC METABOLIC PANEL
BUN: 11 mg/dL (ref 6–23)
CHLORIDE: 81 meq/L — AB (ref 96–112)
CO2: 22 mEq/L (ref 19–32)
Calcium: 8.2 mg/dL — ABNORMAL LOW (ref 8.4–10.5)
Creatinine, Ser: 0.83 mg/dL (ref 0.50–1.35)
GFR calc non Af Amer: >90 mL/min (ref >90)
Glucose, Bld: 130 mg/dL — ABNORMAL HIGH (ref 70–99)
POTASSIUM: 4.3 meq/L (ref 3.7–5.3)
Sodium: 113 mEq/L — CL (ref 137–147)

## 2013-06-14 MED ORDER — IOHEXOL 300 MG/ML  SOLN
80.0000 mL | Freq: Once | INTRAMUSCULAR | Status: AC | PRN
  Administered 2013-06-14: 80 mL via INTRAVENOUS

## 2013-06-14 NOTE — Evaluation (Signed)
Physical Therapy Evaluation Patient Details Name: Juan [name] MRN: [id number] DOB: November 30, 1950 Today's Date: 06/14/2013   History of Present Illness  Pt is a 63 year old male who presented on 5/10 to ED with weakness and multiple falls over the last one week and is found to have an extremely low sodium level.  No seizure activities noted and his mental status is baseline. He is hemodynamically stable. Chloride is also noted to be low. Reason for hyponatremia is multifactorial, including dehydration, use of diuretics, alcoholism. He also has transaminitis, likely secondary to his alcohol.   Clinical Impression  Pt currently with functional limitations due to the deficits listed below (see PT Problem List).  Pt will benefit from skilled PT to increase their independence and safety with mobility to allow discharge to the venue listed below.  Pt slightly unsteady today so used RW.  Spouse reports RW is likely too big to use in home (reports "dollhouse" sized home) however discussed pt would probably progress to [facility] or no assistive device upon d/c.  Pt will likely progress to baseline quickly with medical interventions however will follow in acute care to assist safely mobilizing pt.     Follow Up Recommendations No PT follow up    Equipment Recommendations  Rolling walker with 5" wheels    Recommendations for Other Services       Precautions / Restrictions Precautions Precautions: Fall      Mobility  Bed Mobility Overal bed mobility: Modified Independent             General bed mobility comments: pt in chairt  Transfers Overall transfer level: Needs assistance Equipment used: None Transfers: Sit to/from Stand Sit to Stand: Min guard         General transfer comment: min/guard for safety, unsteady upon rise however self corrected  Ambulation/Gait Ambulation/Gait assistance: Min guard Ambulation Distance (Feet): 280 Feet Assistive device: Rolling walker (2  wheeled) Gait Pattern/deviations: Step-through pattern;Trunk flexed;Decreased stride length     General Gait Details: pt very unsteady ambulating to doorway so recommended using RW for UE support and pt agreeable, steadiness improved with RW, verbal cues for safe use of RW  Stairs            Wheelchair Mobility    Modified Rankin (Stroke Patients Only)       Balance                                             Pertinent Vitals/Pain No pain    Home Living Family/patient expects to be discharged to:: Private residence Living Arrangements: Spouse/significant other   Type of Home: House Home Access: Stairs to enter Entrance Stairs-Rails: Right Entrance Stairs-Number of Steps: 1 Home Layout: Two level;Able to live on main level with bedroom/bathroom Home Equipment: Cane - single point      Prior Function Level of Independence: Independent               Hand Dominance        Extremity/Trunk Assessment   Upper Extremity Assessment: Overall WFL for tasks assessed (no tremors noted)           Lower Extremity Assessment: Generalized weakness      Cervical / Trunk Assessment: Kyphotic;Other exceptions  Communication   Communication: No difficulties  Cognition Arousal/Alertness: Awake/alert Behavior During Therapy: Flat affect Overall Cognitive Status: Within Functional Limits for  tasks assessed                      General Comments      Exercises        Assessment/Plan    PT Assessment Patient needs continued PT services  PT Diagnosis Difficulty walking   PT Problem List Decreased strength;Decreased mobility;Decreased balance;Decreased knowledge of use of DME  PT Treatment Interventions DME instruction;Gait training;Functional mobility training;Therapeutic activities;Therapeutic exercise;Patient/family education;Balance training   PT Goals (Current goals can be found in the Care Plan section) Acute Rehab PT  Goals Patient Stated Goal: get stronger PT Goal Formulation: With patient Time For Goal Achievement: 06/21/13 Potential to Achieve Goals: Good    Frequency Min 3X/week   Barriers to discharge        Co-evaluation               End of Session Equipment Utilized During Treatment: Gait belt Activity Tolerance: Patient tolerated treatment well Patient left: in chair;with call bell/phone within reach;with chair alarm set           Time: 1013-1030 PT Time Calculation (min): 17 min   Charges:   PT Evaluation $Initial PT Evaluation Tier I: 1 Procedure PT Treatments $Gait Training: 8-22 mins   PT G CodesLynnell Catalan:          Jayelle Page E Rosaria Kubin 06/14/2013, 12:20 PM Zenovia JarredKati Minie Roadcap, PT, DPT 06/14/2013 Pager: [phone]

## 2013-06-14 NOTE — Progress Notes (Signed)
INITIAL NUTRITION ASSESSMENT  DOCUMENTATION CODES Per approved criteria  -Not Applicable   INTERVENTION: - Encouraged excellent meal intake - Provided "Heart Healthy Nutrition Therapy" handout from [employer] which was briefly reviewed with pt and wife, RD contact information provided. - RD to continue to monitor   NUTRITION DIAGNOSIS: Altered GI function related to alcohol abuse as evidenced by MD notes.    Goal: Pt to consume >90% of meals  Monitor:  Weights, labs, intake  Reason for Assessment: Malnutrition screening tool   63 y.o. male  Admitting Dx: Hyponatremia  ASSESSMENT: Pt with past medical history of alcoholism, congestive heart failure, depression, and anxiety, who was in his usual state of health till about a week ago, when he started feeling weak, dizzy. Denies any syncopal episode. No seizure-type activities although he's had tremors. According to his wife he has had multiple falls without any head injuries or other injuries in the last one week. He denies any shortness of breath that is more than usual. No chest pain. On 06/12/13 he felt weak and 'his legs gave out'. He couldn't get back into the house and spent the night at the porch. He is able to move his legs. Over the last one week he must have had about 40 ounces of bourbon. He also has wine with dinner. Denies beer intake. He drinks quite heavily otherwise and over the last one week he has been trying to cut down. He also tells me that over the last few months or so his psychiatrist has been monitoring his sodium levels. Last check was about 2-3 weeks ago and sodium was 123 mEq/L. Patient was initially on Zoloft, which was discontinued due to low sodium and he was placed perhaps on Effexor. About 2-3 weeks ago he was changed back to Zoloft. He's also on diuretics, which he takes on a regular basis. Denies any other medication changes. Does report poor oral intake.  Met with pt and wife who  report pt with poor appetite for the past 2 weeks r/t pt not feeling well. Pt states he was eating 1 good meal/day but not much more. States he's gained over 10 pounds of weight in the past few months but denies this was fluid retention. Wife has been providing low fat/low sodium foods for pt. Pt states he ate all of his breakfast this morning and reports appetite is much improved and denied needing any nutritional supplements. Pt working on lunch during visit, unable to perform nutrition focused physical exam.   Sodium low AST/ALT elevated  Getting multivitamin, folic acid, and thiamine    Height: Ht Readings from Last 1 Encounters:  06/13/13 '5\' 8"'  (1.727 m)    Weight: Wt Readings from Last 1 Encounters:  06/13/13 188 lb 4.4 oz (85.4 kg)    Ideal Body Weight: 154 lbs   % Ideal Body Weight: 122%  Wt Readings from Last 10 Encounters:  06/13/13 188 lb 4.4 oz (85.4 kg)  11/26/12 176 lb (79.833 kg)  11/24/12 178 lb 4.8 oz (80.876 kg)  11/13/12 175 lb 7.8 oz (79.6 kg)  08/06/12 179 lb (81.194 kg)  07/20/12 180 lb 14.4 oz (82.056 kg)  07/30/11 171 lb (77.565 kg)  04/26/11 170 lb (77.111 kg)  01/03/11 163 lb 6.4 oz (74.118 kg)  12/10/10 168 lb 12.8 oz (76.567 kg)    Usual Body Weight: 175 lbs per pt   % Usual Body Weight: 107%  BMI:  Body mass index is 28.63 kg/(m^2).  Estimated Nutritional Needs: Kcal: 9450-3888 Protein: 85-105g Fluid: 1.7-1.9L/day   Skin: Skin shear over large abdominal hernia  Diet Order: Cardiac  EDUCATION NEEDS: -Education needs addressed - reviewed heart healthy diet with pt and wife, handouts provided with RD contact information    Intake/Output Summary (Last 24 hours) at 06/14/13 1308 Last data filed at 06/14/13 1100  Gross per 24 hour  Intake 698.75 ml  Output   1475 ml  Net -776.25 ml    Last BM: 5/8  Labs:   Recent Labs Lab 06/13/13 1214  06/13/13 2054 06/14/13 0010 06/14/13 0355  NA 112*  < > 115* 113* 116*  K 4.8  < >  4.6 4.3 4.2  CL 78*  < > 81* 81* 83*  CO2 20  < > '22 22 21  ' BUN 13  < > '11 11 10  ' CREATININE 0.69  < > 0.74 0.83 0.73  CALCIUM 8.7  < > 8.3* 8.2* 8.3*  MG 1.8  --   --   --   --   GLUCOSE 123*  < > 118* 130* 95  < > = values in this interval not displayed.  CBG (last 3)   Recent Labs  06/14/13 0740 06/14/13 1140  GLUCAP 112* 110*    Scheduled Meds: . aspirin EC  81 mg Oral Daily  . carvedilol  6.25 mg Oral BID WC  . Chlorhexidine Gluconate Cloth  6 each Topical Q0600  . fluticasone  2 spray Each Nare Daily  . folic acid  1 mg Oral Daily  . lisinopril  20 mg Oral Daily  . LORazepam  0-4 mg Intravenous Q6H   Followed by  . [START ON 06/15/2013] LORazepam  0-4 mg Intravenous Q12H  . mirtazapine  30 mg Oral QHS  . multivitamin with minerals  1 tablet Oral Daily  . mupirocin ointment  1 application Nasal BID  . nicotine  14 mg Transdermal Daily  . nystatin   Topical BID  . sertraline  150 mg Oral Daily  . sodium chloride  3 mL Intravenous Q12H  . thiamine  100 mg Oral Daily   Or  . thiamine  100 mg Intravenous Daily  . tiotropium  18 mcg Inhalation Daily  . triamcinolone cream   Topical BID    Continuous Infusions: . sodium chloride 75 mL/hr at 06/14/13 2800    Past Medical History  Diagnosis Date  . CHF (congestive heart failure) 10/2010    ECHO:  EF 40%, Grade II diastolic dysfunction  . Alcoholism   . Allergy     seasonal   . Anxiety   . Depression   . Asthma   . Hyperlipidemia   . Hypertension   . Seizures     Past Surgical History  Procedure Laterality Date  . Removal of ingrown toenail      Mikey College MS, RD, [place] Pager [phone] After Hours Pager

## 2013-06-14 NOTE — Evaluation (Signed)
Occupational Therapy Evaluation Patient Details Name: Juan [name] MRN: [id number] DOB: [birth year] Today's Date: 06/14/2013    History of Present Illness Juan [name] is a 63 y.o. male with a past medical history of alcoholism, congestive heart failure, depression, and anxiety, who was in his usual state of health till about a week ago, when he started feeling weak, dizzy. Denies any syncopal episode. No seizure-type activities although he's had tremors. According to his wife he has had multiple falls without any head injuries or other injuries in the last one week. He denies any shortness of breath that is more than usual. No chest pain. He has had some nausea and vomiting about a week to 2 weeks ago, but none recently. Denies any diarrhea. No headaches. Last night he felt weak and 'his legs gave out'. He couldn't get back into the house and spent the night at the porch. He is able to move his legs. Over the last one week he must have had about 40 ounces of bourbon. He also has wine with dinner. Denies beer intake. He drinks quite heavily otherwise and over the last one week he has been trying to cut down. He also tells me that over the last few months or so his psychiatrist has been monitoring his sodium levels. Last check was about 2-3 weeks ago and sodium was 123. Patient was initially on Zoloft, which was discontinued due to low sodium and he was placed perhaps on Effexor. About 2-3 weeks ago he was changed back to Zoloft. He's also on diuretics, which he takes on a regular basis. Denies any other medication changes. Does report poor oral intake   Clinical Impression   Pt presents to OT with decreased I with ADL activity and will benefit from skilled OT to address problems listed below to increase I with ADL activity and return to PLOF    Follow Up Recommendations  No OT follow up    Equipment Recommendations  None recommended by OT    Recommendations for Other Services PT  consult     Precautions / Restrictions Precautions Precautions: Fall Restrictions Weight Bearing Restrictions: No      Mobility Bed Mobility Overal bed mobility: Needs Assistance             General bed mobility comments: pt in chairt  Transfers Overall transfer level: Needs assistance   Transfers: Sit to/from Stand Sit to Stand: Min guard         General transfer comment: VC for hand placement         ADL Overall ADL's : Needs assistance/impaired Eating/Feeding: Set up;Sitting   Grooming: Set up;Sitting   Upper Body Bathing: Set up;Sitting   Lower Body Bathing: Minimal assistance;Sit to/from stand   Upper Body Dressing : Set up;Sitting   Lower Body Dressing: Minimal assistance;Sit to/from stand   Toilet Transfer: Minimal assistance Toilet Transfer Details (indicate cue type and reason): sit to stand Toileting- Clothing Manipulation and Hygiene: Minimal assistance;Sit to/from stand                  Perception      Extremity/Trunk Assessment Upper Extremity Assessment Upper Extremity Assessment: Overall WFL for tasks assessed (no tremors noted)           Communication Communication Communication: No difficulties   Cognition Arousal/Alertness: Awake/alert Behavior During Therapy: Flat affect Overall Cognitive Status: Within Functional Limits for tasks assessed  General Comments       Exercises       Shoulder Instructions      Home Living Family/patient expects to be discharged to:: Private residence Living Arrangements: Spouse/significant other   Type of Home: House Home Access: Stairs to enter Entergy CorporationEntrance Stairs-Number of Steps: 1 Entrance Stairs-Rails: Right Home Layout: Two level;Able to live on main level with bedroom/bathroom     Bathroom Shower/Tub: Chief Strategy OfficerTub/shower unit   Bathroom Toilet: Standard     Home Equipment: Cane - single point          Prior Functioning/Environment Level of  Independence: Independent             OT Diagnosis: Generalized weakness   OT Problem List: Decreased strength   OT Treatment/Interventions: Self-care/ADL training;Patient/family education    OT Goals(Current goals can be found in the care plan section) Acute Rehab OT Goals Patient Stated Goal: get stronger OT Goal Formulation: With patient Time For Goal Achievement: 06/28/13 Potential to Achieve Goals: Good ADL Goals Pt Will Perform Grooming: with modified independence;standing Pt Will Perform Lower Body Dressing: with modified independence;sit to/from stand Pt Will Transfer to Toilet: with modified independence;ambulating;regular height toilet Pt Will Perform Toileting - Clothing Manipulation and hygiene: with modified independence;sit to/from stand  OT Frequency: Min 2X/week    End of Session Nurse Communication: Mobility status  Activity Tolerance: Patient tolerated treatment well Patient left: in chair;with call bell/phone within reach;with family/visitor present   Time: 1050-1108 OT Time Calculation (min): 18 min Charges:  OT General Charges $OT Visit: 1 Procedure OT Evaluation $Initial OT Evaluation Tier I: 1 Procedure OT Treatments $Self Care/Home Management : 8-22 mins G-Codes:    Alba CoryLorraine D Karine Garn 06/14/2013, 11:27 AM

## 2013-06-14 NOTE — Progress Notes (Signed)
[facility] PROGRESS NOTE  CEM KOSMAN ZOX:[id number] DOB: October 28, 1950 DOA: 06/13/2013 PCP: Renold Don, MD  Assessment/Plan: Weakness/Falls -Likely 2/2 severe hyponatremia and ETOH abuse. -PT/OT recs no further follow up.  Severe Hyponatremia -Slowly correcting. -Continue NS repletion.  ETOH Abuse -Thiamine/folate. -CIWA protocol.  Chronic Systolic CHF -Appears compensated at this time. -Known EF of 30-35%.  Lung "scar" -Noted on CXR. -Will order CT chest to follow. -If concerning for mass, SIADH-like syndrome could be in part responsible for hyponatremia.  Transaminitis -Likely related to ETOH use. -Follow LFTs in am.  Code Status: Full Code Family Communication: Patient only  Disposition Plan: Home when ready   Consultants:  None   Antibiotics:  None   Subjective: No complaints  Objective: Filed Vitals:   06/14/13 0000 06/14/13 0530 06/14/13 0903 06/14/13 1410  BP: 100/56 108/68  91/46  Pulse: 77 79  77  Temp:  98.7 F (37.1 C)  98.1 F (36.7 C)  TempSrc:  Oral  Oral  Resp:  18  16  Height:      Weight:      SpO2:  98% 95% 96%    Intake/Output Summary (Last 24 hours) at 06/14/13 1732 Last data filed at 06/14/13 1500  Gross per 24 hour  Intake 1351.25 ml  Output   1175 ml  Net 176.25 ml   Filed Weights   06/13/13 0903 06/13/13 1206  Weight: 88.451 kg (195 lb) 85.4 kg (188 lb 4.4 oz)    Exam:   General:  AA Ox3  Cardiovascular: RRR  Respiratory: CTA B  Abdomen: S/NT/ND/+BS  Extremities: 1+ edema bilaterally   Neurologic:  Non-focal  Data Reviewed: Basic Metabolic Panel:  Recent Labs Lab 06/13/13 1214 06/13/13 1537 06/13/13 1806 06/13/13 2054 06/14/13 0010 06/14/13 0355  NA 112* 113* 115* 115* 113* 116*  K 4.8 4.6 5.0 4.6 4.3 4.2  CL 78* 79* 81* 81* 81* 83*  CO2 20 21 23 22 22 21   GLUCOSE 123* 140* 105* 118* 130* 95  BUN 13 12 11 11 11 10   CREATININE 0.69 0.68 0.76 0.74 0.83 0.73  CALCIUM 8.7  8.2* 8.6 8.3* 8.2* 8.3*  MG 1.8  --   --   --   --   --    Liver Function Tests:  Recent Labs Lab 06/13/13 0920 06/14/13 0355  AST 145* 109*  ALT 65* 54*  ALKPHOS 109 88  BILITOT 0.7 0.5  PROT 7.7 6.0  ALBUMIN 3.8 2.9*   No results found for this basename: LIPASE, AMYLASE,  in the last 168 hours No results found for this basename: AMMONIA,  in the last 168 hours CBC:  Recent Labs Lab 06/13/13 0920 06/14/13 0355  WBC 14.7* 7.9  NEUTROABS 13.0*  --   HGB 12.3* 9.8*  HCT 32.6* 26.7*  MCV 84.2 85.9  PLT 296 233   Cardiac Enzymes:  Recent Labs Lab 06/13/13 0920 06/13/13 1214  CKTOTAL  --  4193*  TROPONINI <0.30  --    BNP (last 3 results)  Recent Labs  11/11/12 0427 11/13/12 0328  PROBNP 8689.0* 5340.0*   CBG:  Recent Labs Lab 06/14/13 0740 06/14/13 1140 06/14/13 1710  GLUCAP 112* 110* 97    Recent Results (from the past 240 hour(s))  MRSA PCR SCREENING     Status: Abnormal   Collection Time    06/13/13  2:46 PM      Result Value Ref Range Status   MRSA by PCR POSITIVE (*) NEGATIVE Final  Comment: RESULT CALLED TO, READ BACK BY AND VERIFIED WITH:     ROTH,B AT 1830 ON [id number] BY HOOKER,B                The GeneXpert MRSA Assay (FDA     approved for NASAL specimens     only), is one component of a     comprehensive MRSA colonization     surveillance program. It is not     intended to diagnose MRSA     infection nor to guide or     monitor treatment for     MRSA infections.     Studies: Dg Chest 2 View  06/13/2013   CLINICAL DATA:  History of CHF asthma and hypertension and smoking history  EXAM: CHEST  2 VIEW  COMPARISON:  DG CHEST 1V PORT dated 11/11/2012  FINDINGS: Hyperinflation suggests COPD. Heart size and vascular pattern are normal. Stable bilateral perihilar bronchial wall thickening and increased markings. Focal interstitial change in the right middle lobe, an area of prior airspace opacity seen on the prior studies.  IMPRESSION:  Chronic bronchitic change. Opacity right middle lobe likely due to scarring but would be better characterized with contrast-enhanced CT thorax. .   Electronically Signed   By: Esperanza Heiraymond  Rubner M.D.   On: 06/13/2013 09:39   [place] Abdomen Complete  06/13/2013   CLINICAL DATA:  Elevated liver function tests.  Cholelithiasis.  EXAM: ULTRASOUND ABDOMEN COMPLETE  COMPARISON:  10/23/2010  FINDINGS: Gallbladder:  A gallstone is again seen measuring approximately 2 cm. No evidence of gallbladder wall thickening or pericholecystic fluid. No sonographic Murphy's sign noted by sonographer.  Common bile duct:  Diameter: 4 mm  Liver:  Diffusely increased echogenicity of the hepatic parenchyma, consistent with hepatic steatosis or other diffuse hepatocellular disease. No focal mass lesion identified.  IVC:  No abnormality visualized.  Pancreas:  Not well visualized due to patient habitus and overlying bowel gas  Spleen:  Size and appearance within normal limits.  Right Kidney:  Length: 10.8 cm. Echogenicity within normal limits. No mass or hydronephrosis visualized.  Left Kidney:  Length: 11.7 cm. Echogenicity within normal limits. No mass or hydronephrosis visualized.  Abdominal aorta:  No aneurysm visualized.  Other findings:  None.  IMPRESSION: Cholelithiasis. No sonographic signs of acute cholecystitis or biliary dilatation.  Hepatic steatosis or other diffuse hepatocellular disease.   Electronically Signed   By: Myles RosenthalJohn  Stahl M.D.   On: 06/13/2013 14:06    Scheduled Meds: . aspirin EC  81 mg Oral Daily  . carvedilol  6.25 mg Oral BID WC  . Chlorhexidine Gluconate Cloth  6 each Topical Q0600  . fluticasone  2 spray Each Nare Daily  . folic acid  1 mg Oral Daily  . lisinopril  20 mg Oral Daily  . LORazepam  0-4 mg Intravenous Q6H   Followed by  . [START ON 06/15/2013] LORazepam  0-4 mg Intravenous Q12H  . mirtazapine  30 mg Oral QHS  . multivitamin with minerals  1 tablet Oral Daily  . mupirocin ointment  1  application Nasal BID  . nicotine  14 mg Transdermal Daily  . nystatin   Topical BID  . sertraline  150 mg Oral Daily  . sodium chloride  3 mL Intravenous Q12H  . thiamine  100 mg Oral Daily   Or  . thiamine  100 mg Intravenous Daily  . tiotropium  18 mcg Inhalation Daily  . triamcinolone cream   Topical BID  Continuous Infusions: . sodium chloride 75 mL/hr at 06/14/13 5883    Principal Problem:   Hyponatremia Active Problems:   Depression   Anxiety   Alcoholism   HTN (hypertension)   Chronic systolic heart failure   Tobacco abuse   Transaminitis    Time spent: 35 minutes. Greater than 50% of this time was spent in direct contact with the patient coordinating care.    Henderson Cloud  [facility] Pager [phone]  If 7PM-7AM, please contact night-coverage at [email or web address], password [facility] 06/14/2013, 5:32 PM  LOS: 1 day

## 2013-06-15 DIAGNOSIS — R911 Solitary pulmonary nodule: Secondary | ICD-10-CM

## 2013-06-15 HISTORY — DX: Solitary pulmonary nodule: R91.1

## 2013-06-15 LAB — CBC
HCT: 25.5 % — ABNORMAL LOW (ref 39.0–52.0)
Hemoglobin: 8.9 g/dL — ABNORMAL LOW (ref 13.0–17.0)
MCH: 31.1 pg (ref 26.0–34.0)
MCHC: 34.9 g/dL (ref 30.0–36.0)
MCV: 89.2 fL (ref 78.0–100.0)
PLATELETS: 244 10*3/uL (ref 150–400)
RBC: 2.86 MIL/uL — ABNORMAL LOW (ref 4.22–5.81)
RDW: 13 % (ref 11.5–15.5)
WBC: 8.7 10*3/uL (ref 4.0–10.5)

## 2013-06-15 LAB — GLUCOSE, CAPILLARY: GLUCOSE-CAPILLARY: 178 mg/dL — AB (ref 70–99)

## 2013-06-15 LAB — BASIC METABOLIC PANEL
BUN: 10 mg/dL (ref 6–23)
CALCIUM: 8.2 mg/dL — AB (ref 8.4–10.5)
CO2: 23 mEq/L (ref 19–32)
Chloride: 92 mEq/L — ABNORMAL LOW (ref 96–112)
Creatinine, Ser: 0.84 mg/dL (ref 0.50–1.35)
GFR calc Af Amer: >90 mL/min (ref >90)
GFR calc non Af Amer: >90 mL/min (ref >90)
GLUCOSE: 91 mg/dL (ref 70–99)
Potassium: 4.3 mEq/L (ref 3.7–5.3)
Sodium: 126 mEq/L — ABNORMAL LOW (ref 137–147)

## 2013-06-15 MED ORDER — CLONAZEPAM 0.5 MG PO TABS
0.5000 mg | ORAL_TABLET | Freq: Two times a day (BID) | ORAL | Status: DC
  Administered 2013-06-15 – 2013-06-17 (×5): 0.5 mg via ORAL
  Filled 2013-06-15 (×5): qty 1

## 2013-06-15 NOTE — Progress Notes (Signed)
Physical Therapy Treatment Patient Details Name: Juan [name] MRN: [id number] DOB: [birth year] Today's Date: 06/15/2013    History of Present Illness Pt is a 63 year old male who presented on 5/10 to ED with weakness and multiple falls over the last one week and is found to have an extremely low sodium level.  No seizure activities noted and his mental status is baseline. He is hemodynamically stable. Chloride is also noted to be low. Reason for hyponatremia is multifactorial, including dehydration, use of diuretics, alcoholism. He also has transaminitis, likely secondary to his alcohol.     PT Comments    *Pt progressing with mobility. He walked 300' with RW and min/guard assist. At present he still requires use of RW for balance. Performed standing BLE exercises. **  Follow Up Recommendations  No PT follow up     Equipment Recommendations  Rolling walker with 5" wheels    Recommendations for Other Services       Precautions / Restrictions Precautions Precautions: Fall Restrictions Weight Bearing Restrictions: No    Mobility  Bed Mobility Overal bed mobility: Modified Independent             General bed mobility comments: pt in chair  Transfers Overall transfer level: Needs assistance Equipment used: Rolling walker (2 wheeled)   Sit to Stand: Min guard         General transfer comment: min/guard for safety, unsteady upon rise however self corrected  Ambulation/Gait Ambulation/Gait assistance: Min guard Ambulation Distance (Feet): 300 Feet Assistive device: Rolling walker (2 wheeled) Gait Pattern/deviations: Trunk flexed;Step-through pattern;Decreased stride length     General Gait Details: VCs for positioning in RW and to lift head   Stairs            Wheelchair Mobility    Modified Rankin (Stroke Patients Only)       Balance                                    Cognition Arousal/Alertness: Awake/alert Behavior During  Therapy: Flat affect Overall Cognitive Status: Within Functional Limits for tasks assessed                      Exercises General Exercises - Lower Extremity Hip ABduction/ADduction: AROM;Both;10 reps;Standing Hip Flexion/Marching: AROM;Both;10 reps;Standing Heel Raises: AROM;Both;10 reps;Standing Mini-Sqauts: AROM;Both;10 reps;Standing    General Comments        Pertinent Vitals/Pain *6/10 RLE pain RN aware*    Home Living                      Prior Function            PT Goals (current goals can now be found in the care plan section) Acute Rehab PT Goals Patient Stated Goal: get stronger PT Goal Formulation: With patient Time For Goal Achievement: 06/21/13 Potential to Achieve Goals: Good Progress towards PT goals: Progressing toward goals    Frequency  Min 3X/week    PT Plan Current plan remains appropriate    Co-evaluation             End of Session Equipment Utilized During Treatment: Gait belt Activity Tolerance: Patient tolerated treatment well Patient left: in chair;with call bell/phone within reach;with chair alarm set     Time: 4536-4680 PT Time Calculation (min): 23 min  Charges:  $Gait Training: 8-22 mins $Therapeutic Exercise: 8-22 mins  G CodesAlvester Morin:      Ashara Lounsbury K Alvia Tory 06/15/2013, 1:17 PM [phone]

## 2013-06-15 NOTE — Progress Notes (Signed)
[facility] PROGRESS NOTE  Magda BernheimJames K Cohen [id number] DOB: 1950/07/10 DOA: 06/13/2013 PCP: Renold DonWALDEN,JEFF, MD  Assessment/Plan: Weakness/Falls -Likely 2/2 severe hyponatremia and ETOH abuse. -PT/OT recs no further follow up.  Severe Hyponatremia -Slowly correcting. -Continue NS repletion.  ETOH Abuse -Thiamine/folate. -CIWA protocol. -No signs of withdrawal.  Chronic Systolic CHF -Appears compensated at this time. -Known EF of 30-35%.  Lung "scar" -Noted on CXR. -Ct chest with opacity in the RUL inflammatory in origin most likely but cannot exclude solid tumor. -Plan to repeat CXR in 3 months.  Transaminitis -Likely related to ETOH use. -Follow LFTs in am.  Code Status: Full Code Family Communication: Wife via phone. Disposition Plan: Home when ready   Consultants:  None   Antibiotics:  None   Subjective: No complaints  Objective: Filed Vitals:   06/15/13 0005 06/15/13 0657 06/15/13 0947 06/15/13 1406  BP: 107/57 119/63  111/58  Pulse: 82 91  82  Temp: 98.3 F (36.8 C) 98.6 F (37 C)  98.2 F (36.8 C)  TempSrc: Oral Oral  Oral  Resp: 18 18  16   Height:      Weight:  83.1 kg (183 lb 3.2 oz)    SpO2: 98% 96% 97% 97%    Intake/Output Summary (Last 24 hours) at 06/15/13 1437 Last data filed at 06/15/13 1300  Gross per 24 hour  Intake   2650 ml  Output      0 ml  Net   2650 ml   Filed Weights   06/13/13 0903 06/13/13 1206 06/15/13 0657  Weight: 88.451 kg (195 lb) 85.4 kg (188 lb 4.4 oz) 83.1 kg (183 lb 3.2 oz)    Exam:   General:  AA Ox3  Cardiovascular: RRR  Respiratory: CTA B  Abdomen: S/NT/ND/+BS  Extremities: 1+ edema bilaterally   Neurologic:  Non-focal  Data Reviewed: Basic Metabolic Panel:  Recent Labs Lab 06/13/13 1214  06/13/13 1806 06/13/13 2054 06/14/13 0010 06/14/13 0355 06/15/13 0352  NA 112*  < > 115* 115* 113* 116* 126*  K 4.8  < > 5.0 4.6 4.3 4.2 4.3  CL 78*  < > 81* 81* 81* 83* 92*  CO2  20  < > 23 22 22 21 23   GLUCOSE 123*  < > 105* 118* 130* 95 91  BUN 13  < > 11 11 11 10 10   CREATININE 0.69  < > 0.76 0.74 0.83 0.73 0.84  CALCIUM 8.7  < > 8.6 8.3* 8.2* 8.3* 8.2*  MG 1.8  --   --   --   --   --   --   < > = values in this interval not displayed. Liver Function Tests:  Recent Labs Lab 06/13/13 0920 06/14/13 0355  AST 145* 109*  ALT 65* 54*  ALKPHOS 109 88  BILITOT 0.7 0.5  PROT 7.7 6.0  ALBUMIN 3.8 2.9*   No results found for this basename: LIPASE, AMYLASE,  in the last 168 hours No results found for this basename: AMMONIA,  in the last 168 hours CBC:  Recent Labs Lab 06/13/13 0920 06/14/13 0355 06/15/13 0352  WBC 14.7* 7.9 8.7  NEUTROABS 13.0*  --   --   HGB 12.3* 9.8* 8.9*  HCT 32.6* 26.7* 25.5*  MCV 84.2 85.9 89.2  PLT 296 233 244   Cardiac Enzymes:  Recent Labs Lab 06/13/13 0920 06/13/13 1214  CKTOTAL  --  4193*  TROPONINI <0.30  --    BNP (last 3 results)  Recent Labs  11/11/12 0427 11/13/12 0328  PROBNP 8689.0* 5340.0*   CBG:  Recent Labs Lab 06/14/13 0740 06/14/13 1140 06/14/13 1710 06/14/13 2219 06/15/13 1235  GLUCAP 112* 110* 97 186* 178*    Recent Results (from the past 240 hour(s))  MRSA PCR SCREENING     Status: Abnormal   Collection Time    06/13/13  2:46 PM      Result Value Ref Range Status   MRSA by PCR POSITIVE (*) NEGATIVE Final   Comment: RESULT CALLED TO, READ BACK BY AND VERIFIED WITH:     ROTH,B AT 1830 ON 051015 BY HOOKER,B                The GeneXpert MRSA Assay (FDA     approved for NASAL specimens     only), is one component of a     comprehensive MRSA colonization     surveillance program. It is not     intended to diagnose MRSA     infection nor to guide or     monitor treatment for     MRSA infections.     Studies: Ct Chest W Contrast  06/15/2013   CLINICAL DATA:  Abnormal chest radiograph with right middle lobe opacity question nodule  EXAM: CT CHEST WITH CONTRAST  TECHNIQUE:  Multidetector CT imaging of the chest was performed during intravenous contrast administration. Sagittal and coronal MPR images reconstructed from axial data set.  CONTRAST:  80mL OMNIPAQUE IOHEXOL 300 MG/ML SOLN IV  COMPARISON:  Chest radiograph 06/13/2013  FINDINGS: Scattered atherosclerotic calcifications aorta and coronary arteries as well as great vessels.  Thoracic vascular structures grossly patent on nondedicated exam.  No thoracic adenopathy.  Small exophytic cyst at upper pole LEFT kidney 2.2 x 1.8 cm image 53.  Remaining visualized portions of upper abdomen show no significant abnormalities.  RIGHT upper lobe saccular bronchiectasis near minor fissure with adjacent peribronchovascular infiltrate.  Lesser degrees of bronchiectasis within lingula and RIGHT lower lobe.  Diffuse peribronchial thickening.  Area of non solid opacity in the RIGHT upper lobe 2.7 x 2.1 x 2.5 cm.  Probable mucous plugging and nodular distension of a LEFT lower lobe bronchus images 33-35.  No additional mass, nodule, infiltrate, pleural effusion, or pneumothorax.  Few scattered subpleural blebs at upper lobes.  No acute osseous findings.  IMPRESSION: Scattered bronchiectasis greatest in RIGHT upper lobe with scattered areas of peribronchovascular infiltrate.  Probable mucous plugging of a LEFT lower lobe bronchus.  Non solid opacity in the posterior segment of the RIGHT upper lobe, nonspecific, may be inflammatory in origin though non solid tumor is not excluded.  Initial follow-up by chest CT without contrast is recommended in 3 months to confirm persistence. This recommendation follows the consensus statement: Recommendations for the Management of Subsolid Pulmonary Nodules Detected at CT: A Statement from the Fleischner Society as published in Radiology 2013; 266:304-317.   Electronically Signed   By: Ulyses Southward M.D.   On: 06/15/2013 09:39    Scheduled Meds: . aspirin EC  81 mg Oral Daily  . carvedilol  6.25 mg Oral BID WC    . Chlorhexidine Gluconate Cloth  6 each Topical Q0600  . clonazePAM  0.5 mg Oral BID  . fluticasone  2 spray Each Nare Daily  . folic acid  1 mg Oral Daily  . lisinopril  20 mg Oral Daily  . LORazepam  0-4 mg Intravenous Q12H  . mirtazapine  30 mg Oral QHS  . multivitamin with minerals  1 tablet Oral Daily  . mupirocin ointment  1 application Nasal BID  . nicotine  14 mg Transdermal Daily  . nystatin   Topical BID  . sertraline  150 mg Oral Daily  . sodium chloride  3 mL Intravenous Q12H  . thiamine  100 mg Oral Daily   Or  . thiamine  100 mg Intravenous Daily  . tiotropium  18 mcg Inhalation Daily  . triamcinolone cream   Topical BID   Continuous Infusions: . sodium chloride 75 mL/hr at 06/14/13 2132    Principal Problem:   Hyponatremia Active Problems:   Depression   Anxiety   Alcoholism   HTN (hypertension)   Chronic systolic heart failure   Tobacco abuse   Transaminitis    Time spent: 35 minutes. Greater than 50% of this time was spent in direct contact with the patient coordinating care.    Henderson Cloud  [facility] Pager [phone]  If 7PM-7AM, please contact night-coverage at [email or web address], password [facility] 06/15/2013, 2:37 PM  LOS: 2 days

## 2013-06-16 LAB — COMPREHENSIVE METABOLIC PANEL
ALT: 46 U/L (ref 0–53)
AST: 68 U/L — ABNORMAL HIGH (ref 0–37)
Albumin: 2.9 g/dL — ABNORMAL LOW (ref 3.5–5.2)
Alkaline Phosphatase: 71 U/L (ref 39–117)
BILIRUBIN TOTAL: 0.2 mg/dL — AB (ref 0.3–1.2)
BUN: 8 mg/dL (ref 6–23)
CALCIUM: 8.6 mg/dL (ref 8.4–10.5)
CO2: 23 mEq/L (ref 19–32)
CREATININE: 0.78 mg/dL (ref 0.50–1.35)
Chloride: 95 mEq/L — ABNORMAL LOW (ref 96–112)
GFR calc non Af Amer: >90 mL/min (ref >90)
GLUCOSE: 101 mg/dL — AB (ref 70–99)
Potassium: 4.3 mEq/L (ref 3.7–5.3)
Sodium: 128 mEq/L — ABNORMAL LOW (ref 137–147)
Total Protein: 5.9 g/dL — ABNORMAL LOW (ref 6.0–8.3)

## 2013-06-16 LAB — HEMOGLOBIN AND HEMATOCRIT, BLOOD
HEMATOCRIT: 25.1 % — AB (ref 39.0–52.0)
HEMOGLOBIN: 8.9 g/dL — AB (ref 13.0–17.0)

## 2013-06-16 MED ORDER — GABAPENTIN 300 MG PO CAPS
300.0000 mg | ORAL_CAPSULE | Freq: Three times a day (TID) | ORAL | Status: DC
  Administered 2013-06-16 – 2013-06-17 (×3): 300 mg via ORAL
  Filled 2013-06-16 (×5): qty 1

## 2013-06-16 MED ORDER — SENNOSIDES-DOCUSATE SODIUM 8.6-50 MG PO TABS
2.0000 | ORAL_TABLET | Freq: Once | ORAL | Status: AC
Start: 2013-06-16 — End: 2013-06-16
  Administered 2013-06-16: 2 via ORAL
  Filled 2013-06-16: qty 2

## 2013-06-16 MED ORDER — FOLIC ACID 1 MG PO TABS: 1.0000 mg | ORAL_TABLET | Freq: Every day | ORAL | Status: DC

## 2013-06-16 MED ORDER — GABAPENTIN 600 MG PO TABS: 300.0000 mg | ORAL_TABLET | Freq: Three times a day (TID) | ORAL | Status: DC

## 2013-06-16 MED ORDER — TRIAMCINOLONE ACETONIDE 0.1 % EX CREA: TOPICAL_CREAM | Freq: Two times a day (BID) | CUTANEOUS | Status: DC

## 2013-06-16 NOTE — Progress Notes (Signed)
[facility] PROGRESS NOTE  Juan [name] [id number]:[id number] DOB: May 10, 1950 DOA: 06/13/2013 PCP: Renold Don, MD  Assessment/Plan: Weakness/Falls -Likely 2/2 severe hyponatremia and ETOH abuse. -PT/OT recs no further follow up.  Severe Hyponatremia -Slowly correcting. - repeat in am.   ETOH Abuse -Thiamine/folate. -CIWA protocol. -No signs of withdrawal.  Chronic Systolic CHF -Appears compensated at this time. -Known EF of 30-35%.  Lung "scar" -Noted on CXR. -Ct chest with opacity in the RUL inflammatory in origin most likely but cannot exclude solid tumor. -Plan to repeat CXR in 3 months.  Transaminitis -Likely related to ETOH use. - improving.   Anemia: - unclear etiology - has never had any colonoscopy.  -s tool for occult blood ordered - anemia panel ordered.   Umbilical hernia: - surgery not yet scheduled.  - outpatient follow up .   DVT prophylaxis.   Code Status: Full Code Family Communication: Wife at bedside.  Disposition Plan: Home when ready   Consultants:  None   Antibiotics:  None   Subjective: No complaints  Objective: Filed Vitals:   06/15/13 1406 06/15/13 2127 06/16/13 0556 06/16/13 1403  BP: 111/58 144/70 121/64 119/64  Pulse: 82 78 80 76  Temp: 98.2 F (36.8 C) 98.1 F (36.7 C) 98.3 F (36.8 C) 98.7 F (37.1 C)  TempSrc: Oral Oral Oral Oral  Resp: 16 20 20 18   Height:      Weight:   84.188 kg (185 lb 9.6 oz)   SpO2: 97% 100% 96% 99%    Intake/Output Summary (Last 24 hours) at 06/16/13 1724 Last data filed at 06/16/13 1612  Gross per 24 hour  Intake   2280 ml  Output    500 ml  Net   1780 ml   Filed Weights   06/13/13 1206 06/15/13 0657 06/16/13 0556  Weight: 85.4 kg (188 lb 4.4 oz) 83.1 kg (183 lb 3.2 oz) 84.188 kg (185 lb 9.6 oz)    Exam:   General:  AA Ox3  Cardiovascular: RRR  Respiratory: CTA B  Abdomen: S/NT/ND/+BS  Extremities: 1+ edema bilaterally   Neurologic:  Non-focal  Data  Reviewed: Basic Metabolic Panel:  Recent Labs Lab 06/13/13 1214  06/13/13 2054 06/14/13 0010 06/14/13 0355 06/15/13 0352 06/16/13 0445  NA 112*  < > 115* 113* 116* 126* 128*  K 4.8  < > 4.6 4.3 4.2 4.3 4.3  CL 78*  < > 81* 81* 83* 92* 95*  CO2 20  < > 22 22 21 23 23   GLUCOSE 123*  < > 118* 130* 95 91 101*  BUN 13  < > 11 11 10 10 8   CREATININE 0.69  < > 0.74 0.83 0.73 0.84 0.78  CALCIUM 8.7  < > 8.3* 8.2* 8.3* 8.2* 8.6  MG 1.8  --   --   --   --   --   --   < > = values in this interval not displayed. Liver Function Tests:  Recent Labs Lab 06/13/13 0920 06/14/13 0355 06/16/13 0445  AST 145* 109* 68*  ALT 65* 54* 46  ALKPHOS 109 88 71  BILITOT 0.7 0.5 0.2*  PROT 7.7 6.0 5.9*  ALBUMIN 3.8 2.9* 2.9*   No results found for this basename: LIPASE, AMYLASE,  in the last 168 hours No results found for this basename: AMMONIA,  in the last 168 hours CBC:  Recent Labs Lab 06/13/13 0920 06/14/13 0355 06/15/13 0352 06/16/13 1411  WBC 14.7* 7.9 8.7  --   NEUTROABS  13.0*  --   --   --   HGB 12.3* 9.8* 8.9* 8.9*  HCT 32.6* 26.7* 25.5* 25.1*  MCV 84.2 85.9 89.2  --   PLT 296 233 244  --    Cardiac Enzymes:  Recent Labs Lab 06/13/13 0920 06/13/13 1214  CKTOTAL  --  4193*  TROPONINI <0.30  --    BNP (last 3 results)  Recent Labs  11/11/12 0427 11/13/12 0328  PROBNP 8689.0* 5340.0*   CBG:  Recent Labs Lab 06/14/13 0740 06/14/13 1140 06/14/13 1710 06/14/13 2219 06/15/13 1235  GLUCAP 112* 110* 97 186* 178*    Recent Results (from the past 240 hour(s))  MRSA PCR SCREENING     Status: Abnormal   Collection Time    06/13/13  2:46 PM      Result Value Ref Range Status   MRSA by PCR POSITIVE (*) NEGATIVE Final   Comment: RESULT CALLED TO, READ BACK BY AND VERIFIED WITH:     ROTH,B AT 1830 ON 051015 BY HOOKER,B                The GeneXpert MRSA Assay (FDA     approved for NASAL specimens     only), is one component of a     comprehensive MRSA  colonization     surveillance program. It is not     intended to diagnose MRSA     infection nor to guide or     monitor treatment for     MRSA infections.     Studies: Ct Chest W Contrast  06/15/2013   CLINICAL DATA:  Abnormal chest radiograph with right middle lobe opacity question nodule  EXAM: CT CHEST WITH CONTRAST  TECHNIQUE: Multidetector CT imaging of the chest was performed during intravenous contrast administration. Sagittal and coronal MPR images reconstructed from axial data set.  CONTRAST:  80mL OMNIPAQUE IOHEXOL 300 MG/ML SOLN IV  COMPARISON:  Chest radiograph 06/13/2013  FINDINGS: Scattered atherosclerotic calcifications aorta and coronary arteries as well as great vessels.  Thoracic vascular structures grossly patent on nondedicated exam.  No thoracic adenopathy.  Small exophytic cyst at upper pole LEFT kidney 2.2 x 1.8 cm image 53.  Remaining visualized portions of upper abdomen show no significant abnormalities.  RIGHT upper lobe saccular bronchiectasis near minor fissure with adjacent peribronchovascular infiltrate.  Lesser degrees of bronchiectasis within lingula and RIGHT lower lobe.  Diffuse peribronchial thickening.  Area of non solid opacity in the RIGHT upper lobe 2.7 x 2.1 x 2.5 cm.  Probable mucous plugging and nodular distension of a LEFT lower lobe bronchus images 33-35.  No additional mass, nodule, infiltrate, pleural effusion, or pneumothorax.  Few scattered subpleural blebs at upper lobes.  No acute osseous findings.  IMPRESSION: Scattered bronchiectasis greatest in RIGHT upper lobe with scattered areas of peribronchovascular infiltrate.  Probable mucous plugging of a LEFT lower lobe bronchus.  Non solid opacity in the posterior segment of the RIGHT upper lobe, nonspecific, may be inflammatory in origin though non solid tumor is not excluded.  Initial follow-up by chest CT without contrast is recommended in 3 months to confirm persistence. This recommendation follows the  consensus statement: Recommendations for the Management of Subsolid Pulmonary Nodules Detected at CT: A Statement from the Fleischner Society as published in Radiology 2013; 266:304-317.   Electronically Signed   By: Ulyses Southward M.D.   On: 06/15/2013 09:39    Scheduled Meds: . aspirin EC  81 mg Oral Daily  . carvedilol  6.25 mg Oral BID WC  . Chlorhexidine Gluconate Cloth  6 each Topical Q0600  . clonazePAM  0.5 mg Oral BID  . fluticasone  2 spray Each Nare Daily  . folic acid  1 mg Oral Daily  . gabapentin  300 mg Oral TID  . lisinopril  20 mg Oral Daily  . LORazepam  0-4 mg Intravenous Q12H  . mirtazapine  30 mg Oral QHS  . multivitamin with minerals  1 tablet Oral Daily  . mupirocin ointment  1 application Nasal BID  . nicotine  14 mg Transdermal Daily  . nystatin   Topical BID  . sertraline  150 mg Oral Daily  . sodium chloride  3 mL Intravenous Q12H  . thiamine  100 mg Oral Daily   Or  . thiamine  100 mg Intravenous Daily  . tiotropium  18 mcg Inhalation Daily  . triamcinolone cream   Topical BID   Continuous Infusions:    Principal Problem:   Hyponatremia Active Problems:   Depression   Anxiety   Alcoholism   HTN (hypertension)   Chronic systolic heart failure   Tobacco abuse   Transaminitis   Lung nodule seen on imaging study    Time spent: 35 minutes. Greater than 50% of this time was spent in direct contact with the patient coordinating care.    Kathlen ModyVijaya Madisun Hargrove  [facility] Pager [phone]  If 7PM-7AM, please contact night-coverage at [email or web address], password [email or web address] 06/16/2013, 5:24 PM  LOS: 3 days

## 2013-06-16 NOTE — Progress Notes (Signed)
Occupational Therapy Treatment Patient Details Name: EDSON FRUTH MRN: [id number] DOB: April 14, 1950 Today's Date: 06/16/2013    History of present illness Pt is a 63 year old male who presented on 5/10 to ED with weakness and multiple falls over the last one week and is found to have an extremely low sodium level.  No seizure activities noted and his mental status is baseline. He is hemodynamically stable. Chloride is also noted to be low. Reason for hyponatremia is multifactorial, including dehydration, use of diuretics, alcoholism. He also has transaminitis, likely secondary to his alcohol.    OT comments  Much improved this OT visit  Follow Up Recommendations  No OT follow up    Equipment Recommendations  None recommended by OT       Precautions / Restrictions Precautions Precautions: Fall       Mobility Bed Mobility            pt in chair      Transfers Overall transfer level: Needs assistance   Transfers: Sit to/from Stand Sit to Stand: Min guard         General transfer comment: min/guard for safety        ADL       Grooming: Standing;Supervision/safety   Upper Body Bathing: Supervision/ safety;Sitting   Lower Body Bathing: Sit to/from stand;Min guard   Upper Body Dressing : Set up;Sitting   Lower Body Dressing: Sit to/from stand;Min guard   Toilet Transfer: Min guard   Toileting- Architect and Hygiene: Min guard;Sit to/from stand   Tub/ Shower Transfer: Min guard;Grab bars;Ambulation   Functional mobility during ADLs: Min guard General ADL Comments: no tremors noted this OT visit                Cognition   Behavior During Therapy: WFL for tasks assessed/performed Overall Cognitive Status: Within Functional Limits for tasks assessed                              Frequency Min 2X/week     Progress Toward Goals  OT Goals(current goals can now be found in the care plan section)  Progress towards OT  goals: Progressing toward goals     Plan Discharge plan remains appropriate       End of Session     Activity Tolerance Patient tolerated treatment well   Patient Left in chair;with call bell/phone within reach;with chair alarm set   Nurse Communication Mobility status        Time: 0722-0748 OT Time Calculation (min): 26 min  Charges: OT General Charges $OT Visit: 1 Procedure OT Treatments $Self Care/Home Management : 23-37 mins  Alba Cory 06/16/2013, 7:52 AM

## 2013-06-17 LAB — BASIC METABOLIC PANEL
BUN: 8 mg/dL (ref 6–23)
CALCIUM: 9 mg/dL (ref 8.4–10.5)
CO2: 26 meq/L (ref 19–32)
Chloride: 93 mEq/L — ABNORMAL LOW (ref 96–112)
Creatinine, Ser: 0.84 mg/dL (ref 0.50–1.35)
Glucose, Bld: 96 mg/dL (ref 70–99)
Potassium: 4.9 mEq/L (ref 3.7–5.3)
SODIUM: 130 meq/L — AB (ref 137–147)

## 2013-06-17 LAB — HEMOGLOBIN AND HEMATOCRIT, BLOOD
HEMATOCRIT: 27.3 % — AB (ref 39.0–52.0)
Hemoglobin: 9.4 g/dL — ABNORMAL LOW (ref 13.0–17.0)

## 2013-06-17 MED ORDER — GABAPENTIN 300 MG PO CAPS: 300.0000 mg | ORAL_CAPSULE | Freq: Three times a day (TID) | ORAL | Status: DC

## 2013-06-17 MED ORDER — TIOTROPIUM BROMIDE MONOHYDRATE 18 MCG IN CAPS: 18.0000 ug | ORAL_CAPSULE | Freq: Every day | RESPIRATORY_TRACT | Status: DC

## 2013-06-17 NOTE — Progress Notes (Signed)
Physical Therapy Treatment Patient Details Name: Juan [name] MRN: [id number] DOB: 1950/05/15 Today's Date: 06/17/2013    History of Present Illness Pt is a 63 year old male who presented on 5/10 to ED with weakness and multiple falls over the last one week and is found to have an extremely low sodium level.  No seizure activities noted and his mental status is baseline. He is hemodynamically stable. Chloride is also noted to be low. Reason for hyponatremia is multifactorial, including dehydration, use of diuretics, alcoholism. He also has transaminitis, likely secondary to his alcohol.     PT Comments    **Pt ambulated 200' without assistive device. No LOB. Reviewed standing LE exercises. REady to DC home from PT standpoint. *  Follow Up Recommendations  No PT follow up     Equipment Recommendations  None recommended by PT    Recommendations for Other Services       Precautions / Restrictions Precautions Precautions: Fall Restrictions Weight Bearing Restrictions: No    Mobility  Bed Mobility                  Transfers Overall transfer level: Needs assistance   Transfers: Sit to/from Stand Sit to Stand: Modified independent (Device/Increase time)         General transfer comment: used armrests  Ambulation/Gait Ambulation/Gait assistance: Independent Ambulation Distance (Feet): 200 Feet Assistive device: None Gait Pattern/deviations: Decreased step length - left   Gait velocity interpretation: at or above normal speed for age/gender General Gait Details: steady without RW, distance limited by RLE pain   Stairs            Wheelchair Mobility    Modified Rankin (Stroke Patients Only)       Balance                                    Cognition Arousal/Alertness: Awake/alert Behavior During Therapy: WFL for tasks assessed/performed Overall Cognitive Status: Within Functional Limits for tasks assessed                       Exercises General Exercises - Lower Extremity Hip ABduction/ADduction: AROM;Both;10 reps;Standing Hip Flexion/Marching: AROM;Both;10 reps;Standing Heel Raises: AROM;Both;10 reps;Standing Mini-Sqauts: AROM;Both;10 reps;Standing    General Comments        Pertinent Vitals/Pain **R calf "tightness" Instructed pt in calf stretch*    Home Living                      Prior Function            PT Goals (current goals can now be found in the care plan section) Acute Rehab PT Goals Patient Stated Goal: get stronger PT Goal Formulation: With patient Time For Goal Achievement: 06/21/13 Potential to Achieve Goals: Good Progress towards PT goals: Goals met/education completed, patient discharged from PT    Frequency  Min 3X/week    PT Plan Current plan remains appropriate    Co-evaluation             End of Session Equipment Utilized During Treatment: Gait belt Activity Tolerance: Patient tolerated treatment well Patient left: in chair;with call bell/phone within reach;with chair alarm set     Time: 1000-1010 PT Time Calculation (min): 10 min  Charges:  $Gait Training: 8-22 mins  G CodesLucile Crater 06/17/2013, 10:16 AM [phone]

## 2013-06-21 ENCOUNTER — Other Ambulatory Visit: Payer: Self-pay | Admitting: Family Medicine

## 2013-06-21 DIAGNOSIS — D649 Anemia, unspecified: Secondary | ICD-10-CM

## 2013-06-21 DIAGNOSIS — E871 Hypo-osmolality and hyponatremia: Secondary | ICD-10-CM

## 2013-06-22 ENCOUNTER — Other Ambulatory Visit: Payer: No Typology Code available for payment source

## 2013-06-22 DIAGNOSIS — D649 Anemia, unspecified: Secondary | ICD-10-CM

## 2013-06-22 DIAGNOSIS — E871 Hypo-osmolality and hyponatremia: Secondary | ICD-10-CM

## 2013-06-22 LAB — BASIC METABOLIC PANEL
BUN: 14 mg/dL (ref 6–23)
CALCIUM: 9.2 mg/dL (ref 8.4–10.5)
CHLORIDE: 95 meq/L — AB (ref 96–112)
CO2: 24 mEq/L (ref 19–32)
Creat: 0.9 mg/dL (ref 0.50–1.35)
Glucose, Bld: 98 mg/dL (ref 70–99)
Potassium: 5 mEq/L (ref 3.5–5.3)
Sodium: 127 mEq/L — ABNORMAL LOW (ref 135–145)

## 2013-06-22 LAB — CBC
HCT: 30 % — ABNORMAL LOW (ref 39.0–52.0)
Hemoglobin: 10.4 g/dL — ABNORMAL LOW (ref 13.0–17.0)
MCH: 31.2 pg (ref 26.0–34.0)
MCHC: 34.7 g/dL (ref 30.0–36.0)
MCV: 90.1 fL (ref 78.0–100.0)
PLATELETS: 409 10*3/uL — AB (ref 150–400)
RBC: 3.33 MIL/uL — AB (ref 4.22–5.81)
RDW: 13.3 % (ref 11.5–15.5)
WBC: 8.2 10*3/uL (ref 4.0–10.5)

## 2013-06-22 NOTE — Progress Notes (Signed)
BMP AND CBC DONE TODAY Ellesse Antenucci 

## 2013-06-23 ENCOUNTER — Telehealth: Payer: Self-pay | Admitting: Family Medicine

## 2013-06-23 NOTE — Telephone Encounter (Signed)
Spoke with patient and informed him of below. And yes we still are his PCP

## 2013-06-23 NOTE — Discharge Summary (Signed)
Physician Discharge Summary  [name]:[id number] DOB: 13-Nov-1950 DOA: 06/13/2013  PCP: Renold Don, MD  Admit date: 06/13/2013 Discharge date: 06/17/2013  Time spent: 30 minutes  Recommendations for Outpatient Follow-up:  1. Follow up with PCP in one week 2. Check BMP in 2 to 3days to check sodium 3. You will need referral to gastroenterology for colonoscopy.  4. Recommend CXR in 4 weeks to check the abnormal RUL opacity.   Discharge Diagnoses:  Principal Problem:   Hyponatremia Active Problems:   Depression   Anxiety   Alcoholism   HTN (hypertension)   Chronic systolic heart failure   Tobacco abuse   Transaminitis   Lung nodule seen on imaging study   Discharge Condition: improved.   Diet recommendation: regular  Filed Weights   06/15/13 0657 06/16/13 0556 06/17/13 0547  Weight: 83.1 kg (183 lb 3.2 oz) 84.188 kg (185 lb 9.6 oz) 85.458 kg (188 lb 6.4 oz)    History of present illness:   Juan [name] is a 63 y.o. male with a past medical history of alcoholism, congestive heart failure, depression, and anxiety, who was in his usual state of health till about a week ago, when he started feeling weak, dizzy. Denies any syncopal episode. No seizure-type activities although he's had tremors. According to his wife he has had multiple falls without any head injuries or other injuries in the last one week. On arrival to ED, his sodium was  Found to be low and he was was admitted to [facility] service for evaluation of hyponatremia and falls.   Hospital Course:    Weakness/Falls  -Likely 2/2 severe hyponatremia and ETOH abuse. Sodium is 130 on discharge. Recommend checking another level in 2 to 3days.  -PT/OT recs no further follow up.  Severe Hyponatremia  -Slowly correcting.  - repeat in am shows a level 130.  ETOH Abuse  -Thiamine/folate.  -CIWA protocol.  -No signs of withdrawal on discharge.  Chronic Systolic CHF  -Appears compensated at this time.  -Known  EF of 30-35%.  Lung "scar"  -Noted on CXR.  -Ct chest with opacity in the RUL inflammatory in origin most likely but cannot exclude solid tumor.  -Plan to repeat CXR in 3 months.  Transaminitis  -Likely related to ETOH use.  - improving. Recommend checking liver function tests in 4 weeks.  Anemia:  - unclear etiology  - has never had any colonoscopy.  -s tool for occult blood ordered and is pending. But patient wanted to go home and follow up with his pCP and get a referral for gastroenterology for possible colonoscopy.   Umbilical hernia:  - surgery not yet scheduled.  - outpatient follow up .    Procedures:  none  Consultations:  none  Discharge Exam: Filed Vitals:   06/17/13 0547  BP: 132/73  Pulse: 85  Temp: 98.7 F (37.1 C)  Resp: 18    General: alert afebrile comfortable Cardiovascular: s1s2 Respiratory: ctab  Discharge Instructions You were cared for by a hospitalist during your hospital stay. If you have any questions about your discharge medications or the care you received while you were in the hospital after you are discharged, you can call the unit and asked to speak with the hospitalist on call if the hospitalist that took care of you is not available. Once you are discharged, your primary care physician will handle any further medical issues. Please note that NO REFILLS for any discharge medications will be authorized once you are discharged,  as it is imperative that you return to your primary care physician (or establish a relationship with a primary care physician if you do not have one) for your aftercare needs so that they can reassess your need for medications and monitor your lab values.  Discharge Instructions   Discharge instructions    Complete by:  As directed   Follow upwith PCP in one week Please obtain CBC and BMP on Monday and fax the results to your PCP office.  Please obtain referral to a gastroenterology and follow up your anemia.             Medication List         acetaminophen 500 MG tablet  Commonly known as:  TYLENOL  Take 500-1,500 mg by mouth every 6 (six) hours as needed for headache.     aspirin 81 MG EC tablet  Take 1 tablet (81 mg total) by mouth daily.     atorvastatin 40 MG tablet  Commonly known as:  LIPITOR  Take 1 tablet (40 mg total) by mouth daily at 6 PM.     B-Complex Caps  Take 1 capsule by mouth daily.     carvedilol 6.25 MG tablet  Commonly known as:  COREG  Take 1 tablet (6.25 mg total) by mouth 2 (two) times daily with a meal.     clonazePAM 0.5 MG tablet  Commonly known as:  KLONOPIN  Take 0.5 mg by mouth 2 (two) times daily as needed for anxiety.     fexofenadine 180 MG tablet  Commonly known as:  ALLEGRA  Take 180 mg by mouth daily.     fluticasone 50 MCG/ACT nasal spray  Commonly known as:  FLONASE  Place 2 sprays into the nose daily.     folic acid 1 MG tablet  Commonly known as:  FOLVITE  Take 1 tablet (1 mg total) by mouth daily.     furosemide 40 MG tablet  Commonly known as:  LASIX  Take 1 tablet (40 mg total) by mouth daily.     gabapentin 300 MG capsule  Commonly known as:  NEURONTIN  Take 1 capsule (300 mg total) by mouth 3 (three) times daily.     ipratropium 17 MCG/ACT inhaler  Commonly known as:  ATROVENT HFA  Inhale 2 puffs into the lungs 3 (three) times daily as needed for wheezing.     lisinopril 20 MG tablet  Commonly known as:  PRINIVIL,ZESTRIL  Take 1 tablet (20 mg total) by mouth daily.     magnesium oxide 400 MG tablet  Commonly known as:  MAG-OX  Take 800 mg by mouth daily.     mirtazapine 30 MG tablet  Commonly known as:  REMERON  Take 30 mg by mouth at bedtime.     multivitamin with minerals Tabs tablet  Take 1 tablet by mouth daily.     neomycin-bacitracin-polymyxin ointment  Commonly known as:  NEOSPORIN  Apply 1 application topically as needed for wound care. apply to eye     potassium chloride SA 20 MEQ tablet  Commonly  known as:  K-DUR,KLOR-CON  Take 2 tablets (40 mEq total) by mouth daily.     PRIMATENE ASTHMA 12.5-200 MG Tabs  Generic drug:  Ephedrine-Guaifenesin  Take 1-3 tablets by mouth daily as needed.     sertraline 100 MG tablet  Commonly known as:  ZOLOFT  Take 150 mg by mouth daily.     spironolactone 12.5 mg Tabs tablet  Commonly known  as:  ALDACTONE  Take 0.5 tablets (12.5 mg total) by mouth daily.     tiotropium 18 MCG inhalation capsule  Commonly known as:  SPIRIVA  Place 1 capsule (18 mcg total) into inhaler and inhale daily.     triamcinolone cream 0.1 %  Commonly known as:  KENALOG  Apply topically 2 (two) times daily.     Vitamin D-3 1000 UNITS Caps  Take 1,000 Units by mouth daily.       No Known Allergies     Follow-up Information   Follow up with WALDEN,JEFF, MD. Schedule an appointment as soon as possible for a visit in 1 week.   Specialty:  Family Medicine   Contact information:   [address] [place] [address] [phone]        The results of significant diagnostics from this hospitalization (including imaging, microbiology, ancillary and laboratory) are listed below for reference.    Significant Diagnostic Studies: Dg Chest 2 View  06/13/2013   CLINICAL DATA:  History of CHF asthma and hypertension and smoking history  EXAM: CHEST  2 VIEW  COMPARISON:  DG CHEST 1V PORT dated 11/11/2012  FINDINGS: Hyperinflation suggests COPD. Heart size and vascular pattern are normal. Stable bilateral perihilar bronchial wall thickening and increased markings. Focal interstitial change in the right middle lobe, an area of prior airspace opacity seen on the prior studies.  IMPRESSION: Chronic bronchitic change. Opacity right middle lobe likely due to scarring but would be better characterized with contrast-enhanced CT thorax. .   Electronically Signed   By: Esperanza Heir M.D.   On: 06/13/2013 09:39   Ct Chest W Contrast  06/15/2013   CLINICAL DATA:   Abnormal chest radiograph with right middle lobe opacity question nodule  EXAM: CT CHEST WITH CONTRAST  TECHNIQUE: Multidetector CT imaging of the chest was performed during intravenous contrast administration. Sagittal and coronal MPR images reconstructed from axial data set.  CONTRAST:  80mL OMNIPAQUE IOHEXOL 300 MG/ML SOLN IV  COMPARISON:  Chest radiograph 06/13/2013  FINDINGS: Scattered atherosclerotic calcifications aorta and coronary arteries as well as great vessels.  Thoracic vascular structures grossly patent on nondedicated exam.  No thoracic adenopathy.  Small exophytic cyst at upper pole LEFT kidney 2.2 x 1.8 cm image 53.  Remaining visualized portions of upper abdomen show no significant abnormalities.  RIGHT upper lobe saccular bronchiectasis near minor fissure with adjacent peribronchovascular infiltrate.  Lesser degrees of bronchiectasis within lingula and RIGHT lower lobe.  Diffuse peribronchial thickening.  Area of non solid opacity in the RIGHT upper lobe 2.7 x 2.1 x 2.5 cm.  Probable mucous plugging and nodular distension of a LEFT lower lobe bronchus images 33-35.  No additional mass, nodule, infiltrate, pleural effusion, or pneumothorax.  Few scattered subpleural blebs at upper lobes.  No acute osseous findings.  IMPRESSION: Scattered bronchiectasis greatest in RIGHT upper lobe with scattered areas of peribronchovascular infiltrate.  Probable mucous plugging of a LEFT lower lobe bronchus.  Non solid opacity in the posterior segment of the RIGHT upper lobe, nonspecific, may be inflammatory in origin though non solid tumor is not excluded.  Initial follow-up by chest CT without contrast is recommended in 3 months to confirm persistence. This recommendation follows the consensus statement: Recommendations for the Management of Subsolid Pulmonary Nodules Detected at CT: A Statement from the Fleischner Society as published in Radiology 2013; 266:304-317.   Electronically Signed   By: Ulyses Southward  M.D.   On: 06/15/2013 09:39   US  Abdomen Complete  06/13/2013   CLINICAL DATA:  Elevated liver function tests.  Cholelithiasis.  EXAM: ULTRASOUND ABDOMEN COMPLETE  COMPARISON:  10/23/2010  FINDINGS: Gallbladder:  A gallstone is again seen measuring approximately 2 cm. No evidence of gallbladder wall thickening or pericholecystic fluid. No sonographic Murphy's sign noted by sonographer.  Common bile duct:  Diameter: 4 mm  Liver:  Diffusely increased echogenicity of the hepatic parenchyma, consistent with hepatic steatosis or other diffuse hepatocellular disease. No focal mass lesion identified.  IVC:  No abnormality visualized.  Pancreas:  Not well visualized due to patient habitus and overlying bowel gas  Spleen:  Size and appearance within normal limits.  Right Kidney:  Length: 10.8 cm. Echogenicity within normal limits. No mass or hydronephrosis visualized.  Left Kidney:  Length: 11.7 cm. Echogenicity within normal limits. No mass or hydronephrosis visualized.  Abdominal aorta:  No aneurysm visualized.  Other findings:  None.  IMPRESSION: Cholelithiasis. No sonographic signs of acute cholecystitis or biliary dilatation.  Hepatic steatosis or other diffuse hepatocellular disease.   Electronically Signed   By: Myles Rosenthal M.D.   On: 06/13/2013 14:06    Microbiology: Recent Results (from the past 240 hour(s))  MRSA PCR SCREENING     Status: Abnormal   Collection Time    06/13/13  2:46 PM      Result Value Ref Range Status   MRSA by PCR POSITIVE (*) NEGATIVE Final   Comment: RESULT CALLED TO, READ BACK BY AND VERIFIED WITH:     ROTH,B AT 1830 ON [id number] BY HOOKER,B                The GeneXpert MRSA Assay (FDA     approved for NASAL specimens     only), is one component of a     comprehensive MRSA colonization     surveillance program. It is not     intended to diagnose MRSA     infection nor to guide or     monitor treatment for     MRSA infections.     Labs: Basic Metabolic Panel:  Recent  Labs Lab 06/17/13 0354 06/22/13 0834  NA 130* 127*  K 4.9 5.0  CL 93* 95*  CO2 26 24  GLUCOSE 96 98  BUN 8 14  CREATININE 0.84 0.90  CALCIUM 9.0 9.2   Liver Function Tests: No results found for this basename: AST, ALT, ALKPHOS, BILITOT, PROT, ALBUMIN,  in the last 168 hours No results found for this basename: LIPASE, AMYLASE,  in the last 168 hours No results found for this basename: AMMONIA,  in the last 168 hours CBC:  Recent Labs Lab 06/16/13 1411 06/17/13 1210 06/22/13 0834  WBC  --   --  8.2  HGB 8.9* 9.4* 10.4*  HCT 25.1* 27.3* 30.0*  MCV  --   --  90.1  PLT  --   --  409*   Cardiac Enzymes: No results found for this basename: CKTOTAL, CKMB, CKMBINDEX, TROPONINI,  in the last 168 hours BNP: BNP (last 3 results)  Recent Labs  11/11/12 0427 11/13/12 0328  PROBNP 8689.0* 5340.0*   CBG: No results found for this basename: GLUCAP,  in the last 168 hours     Signed:  Kathlen Mody  [facility] 06/17/2013, 11:37 AM

## 2013-06-23 NOTE — Telephone Encounter (Signed)
Would you guys mind calling Juan [name] for the following:  1) Labs look okay.  Sodium still a little low but same as it has been for over 6 months.  Also he is a little anemic.  Neither of these are at dangerous levels.   2) He needs a colonoscopy for his anemia.   3) Are we still his PCP?  When he was admitted last year he said he was switching to another office and he hasn't followed up with me for almost a year.  We need to know so that they can do his lab draws.    Thanks!  Trey Paula

## 2013-06-29 ENCOUNTER — Ambulatory Visit (INDEPENDENT_AMBULATORY_CARE_PROVIDER_SITE_OTHER): Payer: No Typology Code available for payment source | Admitting: Family Medicine

## 2013-06-29 ENCOUNTER — Encounter: Payer: Self-pay | Admitting: Family Medicine

## 2013-06-29 VITALS — BP 134/77 | HR 92 | Temp 98.1°F | Ht 68.0 in | Wt 179.2 lb

## 2013-06-29 DIAGNOSIS — M545 Low back pain, unspecified: Secondary | ICD-10-CM

## 2013-06-29 DIAGNOSIS — R911 Solitary pulmonary nodule: Secondary | ICD-10-CM

## 2013-06-29 DIAGNOSIS — F17200 Nicotine dependence, unspecified, uncomplicated: Secondary | ICD-10-CM

## 2013-06-29 DIAGNOSIS — I10 Essential (primary) hypertension: Secondary | ICD-10-CM

## 2013-06-29 DIAGNOSIS — F329 Major depressive disorder, single episode, unspecified: Secondary | ICD-10-CM

## 2013-06-29 DIAGNOSIS — F32A Depression, unspecified: Secondary | ICD-10-CM

## 2013-06-29 DIAGNOSIS — F3289 Other specified depressive episodes: Secondary | ICD-10-CM

## 2013-06-29 DIAGNOSIS — E871 Hypo-osmolality and hyponatremia: Secondary | ICD-10-CM

## 2013-06-29 DIAGNOSIS — F1020 Alcohol dependence, uncomplicated: Secondary | ICD-10-CM

## 2013-06-29 DIAGNOSIS — J449 Chronic obstructive pulmonary disease, unspecified: Secondary | ICD-10-CM

## 2013-06-29 DIAGNOSIS — J479 Bronchiectasis, uncomplicated: Secondary | ICD-10-CM

## 2013-06-29 DIAGNOSIS — R740 Nonspecific elevation of levels of transaminase and lactic acid dehydrogenase [LDH]: Secondary | ICD-10-CM

## 2013-06-29 DIAGNOSIS — R7401 Elevation of levels of liver transaminase levels: Secondary | ICD-10-CM

## 2013-06-29 MED ORDER — MONTELUKAST SODIUM 10 MG PO TABS: 10.0000 mg | ORAL_TABLET | Freq: Every day | ORAL | Status: DC

## 2013-06-29 MED ORDER — IPRATROPIUM-ALBUTEROL 18-103 MCG/ACT IN AERO: 2.0000 | INHALATION_SPRAY | Freq: Four times a day (QID) | RESPIRATORY_TRACT | Status: DC | PRN

## 2013-06-29 NOTE — Patient Instructions (Signed)
Lungs/allergies: I have sent in the Combivent for you. The Singulair should also help with this. I will refer you to a pulmonologist as well.  Hernia/Heart: Getting in to see the cardiologist is a good idea.  Your hernia needs to be repaired sooner rather than later  Leg pain: - continue with the Neurontin.  You can drop down to 1 pill if you want, and increase back if the pain returns - if you start noticing pain when you walk, let me know  Back pain: - should be better by next week or so.  Continue with the PT  Labs: - come back next week for a lab appt. Schedule this up front - I recommend the fecal occult blood card today, bring this back when you come for the lab appt  Follow-up: - We will recheck labs in about 1 month.   - Come back to see me in that same time so we can make sure things are still going well.

## 2013-06-30 ENCOUNTER — Telehealth: Payer: Self-pay | Admitting: *Deleted

## 2013-06-30 DIAGNOSIS — M545 Low back pain, unspecified: Secondary | ICD-10-CM | POA: Insufficient documentation

## 2013-06-30 DIAGNOSIS — J479 Bronchiectasis, uncomplicated: Secondary | ICD-10-CM | POA: Insufficient documentation

## 2013-06-30 NOTE — Assessment & Plan Note (Signed)
No concerning findings Likely secondary to lying in hospital bed Improving slowly Continue PT and OTC analgesics as needed.  FU if no improvement.

## 2013-06-30 NOTE — Assessment & Plan Note (Signed)
Currently doing well off EtOH. Encouraged to find outpatient therapy -- wants to do it "himself."  Worrisome signs that he is already asking for drinks

## 2013-06-30 NOTE — Assessment & Plan Note (Signed)
Seeing psych next week.

## 2013-06-30 NOTE — Assessment & Plan Note (Signed)
Checking labs today

## 2013-06-30 NOTE — Assessment & Plan Note (Addendum)
Follow up in 3 months

## 2013-06-30 NOTE — Assessment & Plan Note (Signed)
Switched to E-cigarettes

## 2013-06-30 NOTE — Assessment & Plan Note (Signed)
Referral to pulmonology for further evaluation for this plus "chronic bronchitis." Needs PFTs.   Kyphosis not helping his breathing status Starting Combivent as rescue inhaler Continue Spiriva as long-acting for now until we can acquire PFTs

## 2013-06-30 NOTE — Progress Notes (Signed)
Subjective:    Juan BernheimJames K Greenslade is a 63 y.o. male who [facility] for hospital FU:  1.  Hospital FU:  Admitted for severe hyponatremia and alcohol withdrawal.  Sodium improved while in house and was DC'ed home when no longer concerned for withdrawal.  Since DC, has had periods of "forgetting something" at same time every night, seeming to be around time he was previously drinking.  Sober x 14 days, but did ask to have drink on his wedding anniversary and asked about "that study" detailing people who have 2 beers a day are healthier.  Feels stronger since being DC'ed and stopping alcohol.    Other concerns since leaving hospital:  Has had lower back pain, described as aching.  States this is consistent whenever he leaves the hospital.  Relieved with physical therapy or Tylenol.  No injuries, no bladder/bowel incontinence or changes in gait.  Cough and chest congestion:  Consistent since leaving hospital.  Present while admitted as well.  CT scan revealed Right upper lobe nodule and bronchiectasis.  Has had non-productive cough, worse at night.  Has ATrovent inhaler and Spiriva, but does not note much improvement.  States he was diagnosed with "chornic bronchitis" age 63 and has had problems since then.  Denies ever seeing a pulmonologist.    ROS as above per HPI, otherwise neg.  Pertinently, no chest pain, palpitations, SOB, Fever, Chills, Abd pain, N/V/D.   The following portions of the patient's history were reviewed and updated as appropriate: allergies, current medications, past medical history, family and social history, and problem list. Patient is a nonsmoker.    PMH reviewed.  Past Medical History  Diagnosis Date  . CHF (congestive heart failure) 10/2010    ECHO:  EF 40%, Grade II diastolic dysfunction  . Alcoholism   . Allergy     seasonal   . Anxiety   . Depression   . Asthma   . Hyperlipidemia   . Hypertension   . Seizures    Past Surgical History  Procedure  Laterality Date  . Removal of ingrown toenail      Medications reviewed. Current Outpatient Prescriptions  Medication Sig Dispense Refill  . acetaminophen (TYLENOL) 500 MG tablet Take 500-1,500 mg by mouth every 6 (six) hours as needed for headache.      . albuterol-ipratropium (COMBIVENT) 18-103 MCG/ACT inhaler Inhale 2 puffs into the lungs every 6 (six) hours as needed for wheezing or shortness of breath.  14.7 g  3  . aspirin EC 81 MG EC tablet Take 1 tablet (81 mg total) by mouth daily.  90 tablet  0  . atorvastatin (LIPITOR) 40 MG tablet Take 1 tablet (40 mg total) by mouth daily at 6 PM.  30 tablet  6  . B-Complex CAPS Take 1 capsule by mouth daily.      . carvedilol (COREG) 6.25 MG tablet Take 1 tablet (6.25 mg total) by mouth 2 (two) times daily with a meal.  60 tablet  6  . Cholecalciferol (VITAMIN D-3) 1000 UNITS CAPS Take 1,000 Units by mouth daily.      . clonazePAM (KLONOPIN) 0.5 MG tablet Take 0.5 mg by mouth 2 (two) times daily as needed for anxiety.      Marland Kitchen. Ephedrine-Guaifenesin (PRIMATENE ASTHMA) 12.5-200 MG TABS Take 1-3 tablets by mouth daily as needed.       . fexofenadine (ALLEGRA) 180 MG tablet Take 180 mg by mouth daily.      . fluticasone (FLONASE)  50 MCG/ACT nasal spray Place 2 sprays into the nose daily.      . folic acid (FOLVITE) 1 MG tablet Take 1 tablet (1 mg total) by mouth daily.  30 tablet  0  . furosemide (LASIX) 40 MG tablet Take 1 tablet (40 mg total) by mouth daily.  30 tablet  6  . gabapentin (NEURONTIN) 300 MG capsule Take 1 capsule (300 mg total) by mouth 3 (three) times daily.  90 capsule  0  . ipratropium (ATROVENT HFA) 17 MCG/ACT inhaler Inhale 2 puffs into the lungs 3 (three) times daily as needed for wheezing.      Marland Kitchen lisinopril (PRINIVIL,ZESTRIL) 20 MG tablet Take 1 tablet (20 mg total) by mouth daily.  30 tablet  6  . magnesium oxide (MAG-OX) 400 MG tablet Take 800 mg by mouth daily.      . mirtazapine (REMERON) 30 MG tablet Take 30 mg by mouth at  bedtime.      . montelukast (SINGULAIR) 10 MG tablet Take 1 tablet (10 mg total) by mouth at bedtime.  30 tablet  3  . Multiple Vitamin (MULTIVITAMIN WITH MINERALS) TABS tablet Take 1 tablet by mouth daily.      Marland Kitchen neomycin-bacitracin-polymyxin (NEOSPORIN) ointment Apply 1 application topically as needed for wound care. apply to eye      . potassium chloride SA (K-DUR,KLOR-CON) 20 MEQ tablet Take 2 tablets (40 mEq total) by mouth daily.  60 tablet  11  . sertraline (ZOLOFT) 100 MG tablet Take 150 mg by mouth daily.      Marland Kitchen spironolactone (ALDACTONE) 12.5 mg TABS tablet Take 0.5 tablets (12.5 mg total) by mouth daily.  15 tablet  6  . tiotropium (SPIRIVA) 18 MCG inhalation capsule Place 1 capsule (18 mcg total) into inhaler and inhale daily.  30 capsule  1  . triamcinolone cream (KENALOG) 0.1 % Apply topically 2 (two) times daily.  30 g  0  . [DISCONTINUED] albuterol (PROVENTIL,VENTOLIN) 90 MCG/ACT inhaler Inhale 2 puffs into the lungs every 6 (six) hours as needed for wheezing.  17 g  0   No current facility-administered medications for this visit.     Objective:   Physical Exam BP 134/77  Pulse 92  Temp(Src) 98.1 F (36.7 C) (Oral)  Ht 5\' 8"  (1.727 m)  Wt 179 lb 3.2 oz (81.285 kg)  BMI 27.25 kg/m2 Gen:  Alert, cooperative patient who appears stated age in no acute distress.  Vital signs reviewed. HEENT: EOMI,  MMM Cardiac:  Regular rate and rhythm  Pulm:  Rhonchi and scattered wheezing both bases   Abd:  Soft/nondistended/nontender. Back:  Kyphotic.  Minimal BL lumbar tenderness, mostly at SI joints Exts: No edema Psych: Not depressed or anxious appearing  Neuro:  Gait normal.  No focal deficits.   No results found for this or any previous visit (from the past 72 hour(s)).

## 2013-06-30 NOTE — Telephone Encounter (Signed)
[facility] pharmacy calling because Combivent inhaler (18-203 mcg) no longer available.  Ok to switch to Combivent Respimat 20-100 mcg?   Discussed with Dr. Gwendolyn Grant & Ok to change to Respimat with same directions.  There will be no charge for inhaler with patient's insurance coverage per Alyssa.  Altamese Dilling, BSN, RN-BC

## 2013-06-30 NOTE — Assessment & Plan Note (Signed)
Hypotonic, likely secondary to EtOHism plus SSRI Rechecking labs in 1 week, good last week.

## 2013-06-30 NOTE — Assessment & Plan Note (Signed)
At goal today

## 2013-07-06 ENCOUNTER — Other Ambulatory Visit: Payer: No Typology Code available for payment source

## 2013-07-06 DIAGNOSIS — F1020 Alcohol dependence, uncomplicated: Secondary | ICD-10-CM

## 2013-07-06 DIAGNOSIS — R740 Nonspecific elevation of levels of transaminase and lactic acid dehydrogenase [LDH]: Principal | ICD-10-CM

## 2013-07-06 DIAGNOSIS — R7401 Elevation of levels of liver transaminase levels: Secondary | ICD-10-CM

## 2013-07-06 LAB — CBC
HCT: 32.6 % — ABNORMAL LOW (ref 39.0–52.0)
Hemoglobin: 11.2 g/dL — ABNORMAL LOW (ref 13.0–17.0)
MCH: 30.1 pg (ref 26.0–34.0)
MCHC: 34.4 g/dL (ref 30.0–36.0)
MCV: 87.6 fL (ref 78.0–100.0)
Platelets: 413 10*3/uL — ABNORMAL HIGH (ref 150–400)
RBC: 3.72 MIL/uL — ABNORMAL LOW (ref 4.22–5.81)
RDW: 13.5 % (ref 11.5–15.5)
WBC: 10.9 10*3/uL — ABNORMAL HIGH (ref 4.0–10.5)

## 2013-07-06 LAB — COMPREHENSIVE METABOLIC PANEL
ALBUMIN: 4.2 g/dL (ref 3.5–5.2)
ALK PHOS: 78 U/L (ref 39–117)
ALT: 25 U/L (ref 0–53)
AST: 24 U/L (ref 0–37)
BILIRUBIN TOTAL: 0.3 mg/dL (ref 0.2–1.2)
BUN: 15 mg/dL (ref 6–23)
CO2: 27 mEq/L (ref 19–32)
Calcium: 9.5 mg/dL (ref 8.4–10.5)
Chloride: 93 mEq/L — ABNORMAL LOW (ref 96–112)
Creat: 1.01 mg/dL (ref 0.50–1.35)
Glucose, Bld: 120 mg/dL — ABNORMAL HIGH (ref 70–99)
POTASSIUM: 5.2 meq/L (ref 3.5–5.3)
Sodium: 126 mEq/L — ABNORMAL LOW (ref 135–145)
TOTAL PROTEIN: 7.1 g/dL (ref 6.0–8.3)

## 2013-07-06 LAB — LIPID PANEL
CHOLESTEROL: 113 mg/dL (ref 0–200)
HDL: 39 mg/dL — ABNORMAL LOW (ref >39)
LDL Cholesterol: 54 mg/dL (ref 0–99)
Total CHOL/HDL Ratio: 2.9 Ratio
Triglycerides: 102 mg/dL (ref <150)
VLDL: 20 mg/dL (ref 0–40)

## 2013-07-06 NOTE — Progress Notes (Signed)
CMP,FLP AND CBC DONE TODAY Juan [name] 

## 2013-07-14 ENCOUNTER — Other Ambulatory Visit: Payer: Self-pay | Admitting: Internal Medicine

## 2013-07-14 NOTE — Telephone Encounter (Signed)
Rx was sent to pharmacy electronically. 

## 2013-07-19 ENCOUNTER — Telehealth: Payer: Self-pay | Admitting: Family Medicine

## 2013-07-19 NOTE — Telephone Encounter (Signed)
Left message for pt to return nurse call regarding sinus pain.  Clovis Pu, RN

## 2013-07-19 NOTE — Telephone Encounter (Signed)
Patient states he has been having sinus pain for the past few days. Been taking Advil sinus, which is not working. Patient would like to speak to nurse/MD about what he should take. Please call.

## 2013-07-20 NOTE — Telephone Encounter (Signed)
Spoke with pt the morning regarding his sinus pain.  Pt stated pain is much better today.  Pt stated he did not think he needed to make an appt.  The Advil sinus medication is the only thing he has taken.  Pt stated he will keep doing the what is already doing for now.  Pt advised if the pain continue/ worsen then call clinic for an appt.  Pt stated understanding.

## 2013-07-21 ENCOUNTER — Telehealth: Payer: Self-pay | Admitting: Family Medicine

## 2013-07-21 NOTE — Telephone Encounter (Signed)
Juan [name] had a lab test on Wednesday with Dr. Marc Morgans and sodium levels were down 9 pts from the visit on the 1st of June.  Need to discuss with you and possibly come in for another test.

## 2013-07-22 ENCOUNTER — Ambulatory Visit (HOSPITAL_COMMUNITY): Payer: No Typology Code available for payment source

## 2013-07-22 NOTE — Telephone Encounter (Signed)
Patient calls again about message below.

## 2013-07-22 NOTE — Telephone Encounter (Signed)
Please call Juan [name] asap.  Very anxious regarding this issue.

## 2013-07-22 NOTE — Telephone Encounter (Signed)
Spoke with [name], wife.  Patient is fatigued and has decreased appetite.  No nausea, headaches.  Recommended he be evaluated for probable admission.  Has cross-cover appt tomorrow, wife will bring him for that.  That would be easiest way to admit him.  If he declines that appt, she will call me on Monday or Tuesday for lab recheck.  Again stressed need to be seen tomorrow with likely admission.

## 2013-07-23 ENCOUNTER — Ambulatory Visit: Payer: No Typology Code available for payment source

## 2013-07-27 ENCOUNTER — Other Ambulatory Visit: Payer: Self-pay | Admitting: Internal Medicine

## 2013-07-27 ENCOUNTER — Other Ambulatory Visit: Payer: No Typology Code available for payment source

## 2013-07-27 DIAGNOSIS — E871 Hypo-osmolality and hyponatremia: Secondary | ICD-10-CM

## 2013-07-27 DIAGNOSIS — I10 Essential (primary) hypertension: Secondary | ICD-10-CM

## 2013-07-27 LAB — BASIC METABOLIC PANEL
BUN: 13 mg/dL (ref 6–23)
CO2: 23 mEq/L (ref 19–32)
Calcium: 9.1 mg/dL (ref 8.4–10.5)
Chloride: 87 mEq/L — ABNORMAL LOW (ref 96–112)
Creat: 0.86 mg/dL (ref 0.50–1.35)
GLUCOSE: 100 mg/dL — AB (ref 70–99)
POTASSIUM: 5.7 meq/L — AB (ref 3.5–5.3)
SODIUM: 119 meq/L — AB (ref 135–145)

## 2013-07-27 NOTE — Progress Notes (Signed)
BMP DONE TODAY MARCI HOLDER 

## 2013-07-27 NOTE — Telephone Encounter (Signed)
Rx was sent to pharmacy electronically. 

## 2013-07-28 ENCOUNTER — Telehealth: Payer: Self-pay | Admitting: Family Medicine

## 2013-07-28 DIAGNOSIS — E871 Hypo-osmolality and hyponatremia: Secondary | ICD-10-CM

## 2013-07-28 NOTE — Telephone Encounter (Signed)
Called and spoke with patient.  Relayed Na 119.  Of note, we have on record a Na of 118 from 6/15 from psychiatrist's office -- in "to be scanned" box but has NOT yet been scanned.  Also discussed K+ of 5.7.  Patient states "I'm feeling stronger than ever."  Does endorse continued drinking, about 6 oz a day.  Strongly encouraged patient to go into hospital -- however patient persistently refuses despite encouragement.  Came to acceptable solution: patient will return to clinic on Friday for sodium/potassium recheck.  He is to stop drinking and also stop supplemental potassium.  Continue Lasix, which should drop potassium to acceptable levels.    Will place as STAT lab on Friday and call him with results.

## 2013-07-28 NOTE — Telephone Encounter (Signed)
Called and spoke with Juan [name].  Juan [name] wanted clarification about one of his medicines.  I had asked Juan [name] about Lasix, but he had not realized the generic Furosemide was the same thing.  Clarified this point and reiterated treatment for hyponatremia, including warning signs and symptoms.  Sounds like he doesn't want to go in and I will see Juan [name] on Friday.

## 2013-07-28 NOTE — Telephone Encounter (Signed)
Pt would like to talk to Dr Gwendolyn Grant again. He has some more questions

## 2013-07-30 ENCOUNTER — Ambulatory Visit (HOSPITAL_COMMUNITY): Payer: No Typology Code available for payment source

## 2013-07-30 ENCOUNTER — Other Ambulatory Visit: Payer: No Typology Code available for payment source

## 2013-08-02 ENCOUNTER — Telehealth: Payer: Self-pay | Admitting: *Deleted

## 2013-08-02 ENCOUNTER — Telehealth: Payer: Self-pay | Admitting: Family Medicine

## 2013-08-02 ENCOUNTER — Other Ambulatory Visit: Payer: No Typology Code available for payment source

## 2013-08-02 DIAGNOSIS — E871 Hypo-osmolality and hyponatremia: Secondary | ICD-10-CM

## 2013-08-02 LAB — BASIC METABOLIC PANEL
BUN: 9 mg/dL (ref 6–23)
CHLORIDE: 87 meq/L — AB (ref 96–112)
CO2: 25 mEq/L (ref 19–32)
CREATININE: 0.81 mg/dL (ref 0.50–1.35)
Calcium: 9.4 mg/dL (ref 8.4–10.5)
Glucose, Bld: 93 mg/dL (ref 70–99)
Potassium: 5.2 mEq/L (ref 3.5–5.3)
Sodium: 127 mEq/L — ABNORMAL LOW (ref 135–145)

## 2013-08-02 NOTE — Telephone Encounter (Signed)
Called and relayed sodium level to patient -- greatly improved to 127 from 119.  States he has cut back drinking to half of what was before and taking salt tablets.  He is coming to see me tomorrow.  Can recheck in ~3-4 weeks.

## 2013-08-02 NOTE — Progress Notes (Signed)
BMP SENT OUT STAT TODAY Keandra Medero

## 2013-08-02 NOTE — Telephone Encounter (Signed)
Received call from [facility] with stat lab report. Sodium and chloride is low on Juan [name], I reported the results to Dr Gwendolyn Grant.Busick, [name]

## 2013-08-03 ENCOUNTER — Ambulatory Visit (INDEPENDENT_AMBULATORY_CARE_PROVIDER_SITE_OTHER): Payer: No Typology Code available for payment source | Admitting: Family Medicine

## 2013-08-03 ENCOUNTER — Encounter: Payer: Self-pay | Admitting: Family Medicine

## 2013-08-03 VITALS — BP 125/66 | HR 86 | Temp 98.7°F | Ht 68.0 in | Wt 187.2 lb

## 2013-08-03 DIAGNOSIS — F1020 Alcohol dependence, uncomplicated: Secondary | ICD-10-CM

## 2013-08-03 DIAGNOSIS — J479 Bronchiectasis, uncomplicated: Secondary | ICD-10-CM

## 2013-08-03 DIAGNOSIS — M545 Low back pain, unspecified: Secondary | ICD-10-CM

## 2013-08-03 DIAGNOSIS — E871 Hypo-osmolality and hyponatremia: Secondary | ICD-10-CM

## 2013-08-03 DIAGNOSIS — K420 Umbilical hernia with obstruction, without gangrene: Secondary | ICD-10-CM

## 2013-08-03 DIAGNOSIS — R911 Solitary pulmonary nodule: Secondary | ICD-10-CM

## 2013-08-03 NOTE — Progress Notes (Signed)
Subjective:    Juan [name] is a 63 y.o. male who [facility] for FU:  1.  Back pain:  Worse with prolonged walking.  Does little exercise.  Relieved with rest.  States he has bought a new walking stick to help with exercise, thinks this will help back.  No radiation, no need for OTC analgesics.    2.  Cough:  Improved.  Asking to hold off on seeing pulmonology.  Seasonal allergies have also improved.  No longer waking at night with cough.    3.  Hyponatremia:  Real reason for visit.  Has finished his Zoloft.  Last Na was WNL.  No headaches or fatigue, feels good today.    Prev health:  Currently overdue for colonoscopy.  ROS as above per HPI, otherwise neg.  Pertinently, no chest pain, palpitations, SOB, Fever, Chills, Abd pain, N/V/D.   The following portions of the patient's history were reviewed and updated as appropriate: allergies, current medications, past medical history, family and social history, and problem list. Patient is a nonsmoker.    PMH reviewed.  Past Medical History  Diagnosis Date  . CHF (congestive heart failure) 10/2010    ECHO:  EF 40%, Grade II diastolic dysfunction  . Alcoholism   . Allergy     seasonal   . Anxiety   . Depression   . Asthma   . Hyperlipidemia   . Hypertension   . Seizures    Past Surgical History  Procedure Laterality Date  . Removal of ingrown toenail      Medications reviewed. Current Outpatient Prescriptions  Medication Sig Dispense Refill  . acetaminophen (TYLENOL) 500 MG tablet Take 500-1,500 mg by mouth every 6 (six) hours as needed for headache.      . albuterol-ipratropium (COMBIVENT) 18-103 MCG/ACT inhaler Inhale 2 puffs into the lungs every 6 (six) hours as needed for wheezing or shortness of breath.  14.7 g  3  . aspirin EC 81 MG EC tablet Take 1 tablet (81 mg total) by mouth daily.  90 tablet  0  . atorvastatin (LIPITOR) 40 MG tablet Take 1 tablet (40 mg total) by mouth daily at 6 PM.  30 tablet  1  .  B-Complex CAPS Take 1 capsule by mouth daily.      . carvedilol (COREG) 6.25 MG tablet Take 1 tablet (6.25 mg total) by mouth 2 (two) times daily with a meal.  60 tablet  6  . Cholecalciferol (VITAMIN D-3) 1000 UNITS CAPS Take 1,000 Units by mouth daily.      . clonazePAM (KLONOPIN) 0.5 MG tablet Take 0.5 mg by mouth 2 (two) times daily as needed for anxiety.      Marland Kitchen. Ephedrine-Guaifenesin (PRIMATENE ASTHMA) 12.5-200 MG TABS Take 1-3 tablets by mouth daily as needed.       . fexofenadine (ALLEGRA) 180 MG tablet Take 180 mg by mouth daily.      . fluticasone (FLONASE) 50 MCG/ACT nasal spray Place 2 sprays into the nose daily.      . folic acid (FOLVITE) 1 MG tablet Take 1 tablet (1 mg total) by mouth daily.  30 tablet  0  . furosemide (LASIX) 40 MG tablet TAKE ONE TABLET BY MOUTH ONE TIME DAILY   30 tablet  1  . gabapentin (NEURONTIN) 300 MG capsule Take 1 capsule (300 mg total) by mouth 3 (three) times daily.  90 capsule  0  . ipratropium (ATROVENT HFA) 17 MCG/ACT inhaler Inhale 2 puffs  into the lungs 3 (three) times daily as needed for wheezing.      Marland Kitchen lisinopril (PRINIVIL,ZESTRIL) 20 MG tablet TAKE ONE TABLET BY MOUTH ONE TIME DAILY   30 tablet  3  . magnesium oxide (MAG-OX) 400 MG tablet Take 800 mg by mouth daily.      . mirtazapine (REMERON) 30 MG tablet Take 30 mg by mouth at bedtime.      . montelukast (SINGULAIR) 10 MG tablet Take 1 tablet (10 mg total) by mouth at bedtime.  30 tablet  3  . Multiple Vitamin (MULTIVITAMIN WITH MINERALS) TABS tablet Take 1 tablet by mouth daily.      Marland Kitchen neomycin-bacitracin-polymyxin (NEOSPORIN) ointment Apply 1 application topically as needed for wound care. apply to eye      . potassium chloride SA (K-DUR,KLOR-CON) 20 MEQ tablet Take 2 tablets (40 mEq total) by mouth daily.  60 tablet  11  . sertraline (ZOLOFT) 100 MG tablet Take 150 mg by mouth daily.      Marland Kitchen spironolactone (ALDACTONE) 25 MG tablet TAKE HALF TABLET BY MOUTH DAILY   15 tablet  1  . tiotropium  (SPIRIVA) 18 MCG inhalation capsule Place 1 capsule (18 mcg total) into inhaler and inhale daily.  30 capsule  1  . triamcinolone cream (KENALOG) 0.1 % Apply topically 2 (two) times daily.  30 g  0  . [DISCONTINUED] albuterol (PROVENTIL,VENTOLIN) 90 MCG/ACT inhaler Inhale 2 puffs into the lungs every 6 (six) hours as needed for wheezing.  17 g  0   No current facility-administered medications for this visit.     Objective:   Physical Exam BP 125/66  Pulse 86  Temp(Src) 98.7 F (37.1 C)  Ht 5\' 8"  (1.727 m)  Wt 187 lb 3.2 oz (84.913 kg)  BMI 28.47 kg/m2 Gen:  Alert, cooperative patient who appears stated age in no acute distress.  Vital signs reviewed. HEENT: EOMI,  MMM Cardiac:  Regular rate and rhythm Pulm:  Clear to auscultation   Abd:  Soft/nondistended.  Hernia is unchanged in size.  Still with some skin breakdown over area.  Keeping this bandage.  No purulence or foul smell.  Exts: Non edematous BL LE Skin:  Spider angiomas across both cheeks  Results for orders placed in visit on 08/02/13 (from the past 72 hour(s))  BASIC METABOLIC PANEL     Status: Abnormal   Collection Time    08/02/13  9:10 AM      Result Value Ref Range   Sodium 127 (*) 135 - 145 mEq/L   Potassium 5.2  3.5 - 5.3 mEq/L   Chloride 87 (*) 96 - 112 mEq/L   CO2 25  19 - 32 mEq/L   Glucose, Bld 93  70 - 99 mg/dL   BUN 9  6 - 23 mg/dL   Creat 3.54  6.56 - 8.12 mg/dL   Calcium 9.4  8.4 - 75.1 mg/dL

## 2013-08-03 NOTE — Assessment & Plan Note (Signed)
Vastly improved.   Recheck in 3 weeks.  Discussed how alcoholism is contributing to this. Hopefully cutting out zoloft will also help  FU with me in 1 month

## 2013-08-03 NOTE — Patient Instructions (Signed)
Go back to 1 potassium pill a day with your fluid pill.   Come back for a lab visit in 3 weeks for sodium and anemia check.  Bring back the stool card then.  Follow up with Hilty so we can get the hernia repaired.    See me in a month.

## 2013-08-03 NOTE — Assessment & Plan Note (Signed)
Strongly encouraged to FU with cardiology and surgery to have this repaired ASAP

## 2013-08-03 NOTE — Assessment & Plan Note (Signed)
PFTs Pharmacy clinic - to make appt

## 2013-08-03 NOTE — Assessment & Plan Note (Signed)
Agree with walking for exercise FU if needed for no improvement.

## 2013-08-03 NOTE — Assessment & Plan Note (Signed)
Still drinking, but has cut back. Encouraged complete cessation.

## 2013-08-04 ENCOUNTER — Telehealth: Payer: Self-pay | Admitting: *Deleted

## 2013-08-04 NOTE — Telephone Encounter (Signed)
Faxed to [facility] prescription renewal for LifeVest.

## 2013-08-10 ENCOUNTER — Telehealth (HOSPITAL_COMMUNITY): Payer: Self-pay | Admitting: *Deleted

## 2013-08-10 ENCOUNTER — Ambulatory Visit: Payer: No Typology Code available for payment source | Admitting: Internal Medicine

## 2013-08-24 ENCOUNTER — Other Ambulatory Visit: Payer: No Typology Code available for payment source

## 2013-08-25 ENCOUNTER — Other Ambulatory Visit: Payer: Self-pay | Admitting: *Deleted

## 2013-08-25 MED ORDER — TRIAMCINOLONE ACETONIDE 0.1 % EX CREA: TOPICAL_CREAM | Freq: Two times a day (BID) | CUTANEOUS | Status: DC

## 2013-08-31 ENCOUNTER — Other Ambulatory Visit: Payer: No Typology Code available for payment source

## 2013-08-31 DIAGNOSIS — R160 Hepatomegaly, not elsewhere classified: Secondary | ICD-10-CM

## 2013-08-31 DIAGNOSIS — E871 Hypo-osmolality and hyponatremia: Secondary | ICD-10-CM

## 2013-08-31 LAB — BASIC METABOLIC PANEL
BUN: 14 mg/dL (ref 6–23)
CHLORIDE: 90 meq/L — AB (ref 96–112)
CO2: 26 mEq/L (ref 19–32)
Calcium: 9.3 mg/dL (ref 8.4–10.5)
Creat: 0.83 mg/dL (ref 0.50–1.35)
Glucose, Bld: 91 mg/dL (ref 70–99)
POTASSIUM: 5.2 meq/L (ref 3.5–5.3)
SODIUM: 123 meq/L — AB (ref 135–145)

## 2013-08-31 LAB — CBC
HEMATOCRIT: 34.6 % — AB (ref 39.0–52.0)
HEMOGLOBIN: 12.1 g/dL — AB (ref 13.0–17.0)
MCH: 30.6 pg (ref 26.0–34.0)
MCHC: 35 g/dL (ref 30.0–36.0)
MCV: 87.6 fL (ref 78.0–100.0)
Platelets: 368 10*3/uL (ref 150–400)
RBC: 3.95 MIL/uL — AB (ref 4.22–5.81)
RDW: 14.2 % (ref 11.5–15.5)
WBC: 8.7 10*3/uL (ref 4.0–10.5)

## 2013-09-03 ENCOUNTER — Encounter: Payer: Self-pay | Admitting: Family Medicine

## 2013-09-15 ENCOUNTER — Telehealth: Payer: Self-pay | Admitting: Internal Medicine

## 2013-09-16 NOTE — Telephone Encounter (Signed)
Closed encounter °

## 2013-09-20 ENCOUNTER — Other Ambulatory Visit: Payer: Self-pay | Admitting: Internal Medicine

## 2013-09-21 NOTE — Telephone Encounter (Signed)
Rx was sent to pharmacy electronically. 

## 2013-09-23 ENCOUNTER — Ambulatory Visit (HOSPITAL_COMMUNITY)
Admission: RE | Admit: 2013-09-23 | Discharge: 2013-09-23 | Disposition: A | Discharge status: Home / Self Care | Payer: No Typology Code available for payment source | Source: Ambulatory Visit | Attending: Internal Medicine | Admitting: Internal Medicine

## 2013-09-23 DIAGNOSIS — I428 Other cardiomyopathies: Secondary | ICD-10-CM

## 2013-09-23 DIAGNOSIS — I509 Heart failure, unspecified: Secondary | ICD-10-CM | POA: Insufficient documentation

## 2013-09-23 DIAGNOSIS — I517 Cardiomegaly: Secondary | ICD-10-CM

## 2013-09-23 DIAGNOSIS — I5040 Unspecified combined systolic (congestive) and diastolic (congestive) heart failure: Secondary | ICD-10-CM

## 2013-09-23 NOTE — Progress Notes (Signed)
2D Echocardiogram Complete.  09/23/2013   Paxton Kanaan, RDCS  

## 2013-09-29 NOTE — Progress Notes (Signed)
LMTCB for echo results 

## 2013-10-26 ENCOUNTER — Encounter: Payer: Self-pay | Admitting: Family Medicine

## 2013-10-26 ENCOUNTER — Ambulatory Visit (INDEPENDENT_AMBULATORY_CARE_PROVIDER_SITE_OTHER): Payer: No Typology Code available for payment source | Admitting: Family Medicine

## 2013-10-26 VITALS — BP 112/78 | HR 86 | Temp 97.8°F | Ht 68.0 in | Wt 188.8 lb

## 2013-10-26 DIAGNOSIS — E871 Hypo-osmolality and hyponatremia: Secondary | ICD-10-CM

## 2013-10-26 DIAGNOSIS — R21 Rash and other nonspecific skin eruption: Secondary | ICD-10-CM

## 2013-10-26 DIAGNOSIS — K420 Umbilical hernia with obstruction, without gangrene: Secondary | ICD-10-CM

## 2013-10-26 DIAGNOSIS — I428 Other cardiomyopathies: Secondary | ICD-10-CM

## 2013-10-26 DIAGNOSIS — J3089 Other allergic rhinitis: Secondary | ICD-10-CM

## 2013-10-26 DIAGNOSIS — R911 Solitary pulmonary nodule: Secondary | ICD-10-CM

## 2013-10-26 DIAGNOSIS — I10 Essential (primary) hypertension: Secondary | ICD-10-CM

## 2013-10-26 DIAGNOSIS — I429 Cardiomyopathy, unspecified: Secondary | ICD-10-CM

## 2013-10-26 DIAGNOSIS — Z23 Encounter for immunization: Secondary | ICD-10-CM

## 2013-10-26 MED ORDER — MONTELUKAST SODIUM 10 MG PO TABS: 10.0000 mg | ORAL_TABLET | Freq: Every day | ORAL | Status: DC

## 2013-10-26 MED ORDER — TRIAMCINOLONE ACETONIDE 0.025 % EX OINT: 1.0000 "application " | TOPICAL_OINTMENT | Freq: Two times a day (BID) | CUTANEOUS | Status: DC

## 2013-10-26 NOTE — Progress Notes (Signed)
Subjective:    Juan [name] is a 63 y.o. male who presents to [facility] today for several issues:  1.  Alcoholism:  Still drinks about 6 oz per his report of hard liquors everyday.  "Trying to cut back."  Drinks about everyday.    2.  Skin rash:  He is concerned for eczema.  Noted skin rash that started several months ago.  Had Triamcinolone cream from his wife and this helped, but he has been out of this for some time.  No drainage, does itch  3.  Seasonal allergies:  Worse in fall.  On singulair in past but hasn't needed this all summer.  Taking OTC antihistamines which are no longer helping.    ROS as above per HPI, otherwise neg.  Pertinently, no chest pain, palpitations, SOB, Fever, Chills, Abd pain, N/V/D.   The following portions of the patient's history were reviewed and updated as appropriate: allergies, current medications, past medical history, family and social history, and problem list. Patient is a nonsmoker.    PMH reviewed.  Past Medical History  Diagnosis Date  . CHF (congestive heart failure) 10/2010    ECHO:  EF 40%, Grade II diastolic dysfunction  . Alcoholism   . Allergy     seasonal   . Anxiety   . Depression   . Asthma   . Hyperlipidemia   . Hypertension   . Seizures    Past Surgical History  Procedure Laterality Date  . Removal of ingrown toenail      Medications reviewed. Current Outpatient Prescriptions  Medication Sig Dispense Refill  . acetaminophen (TYLENOL) 500 MG tablet Take 500-1,500 mg by mouth every 6 (six) hours as needed for headache.      . albuterol-ipratropium (COMBIVENT) 18-103 MCG/ACT inhaler Inhale 2 puffs into the lungs every 6 (six) hours as needed for wheezing or shortness of breath.  14.7 g  3  . aspirin EC 81 MG EC tablet Take 1 tablet (81 mg total) by mouth daily.  90 tablet  0  . atorvastatin (LIPITOR) 40 MG tablet Take 1 tablet (40 mg total) by mouth daily at 6 PM.  30 tablet  1  . B-Complex CAPS Take 1 capsule by mouth daily.       . carvedilol (COREG) 6.25 MG tablet Take 1 tablet (6.25 mg total) by mouth 2 (two) times daily with a meal. <please schedule appointment for future refills>  60 tablet  2  . Cholecalciferol (VITAMIN D-3) 1000 UNITS CAPS Take 1,000 Units by mouth daily.      . clonazePAM (KLONOPIN) 0.5 MG tablet Take 0.5 mg by mouth 2 (two) times daily as needed for anxiety.      Marland Kitchen Ephedrine-Guaifenesin (PRIMATENE ASTHMA) 12.5-200 MG TABS Take 1-3 tablets by mouth daily as needed.       . fexofenadine (ALLEGRA) 180 MG tablet Take 180 mg by mouth daily.      . fluticasone (FLONASE) 50 MCG/ACT nasal spray Place 2 sprays into the nose daily.      . folic acid (FOLVITE) 1 MG tablet Take 1 tablet (1 mg total) by mouth daily.  30 tablet  0  . furosemide (LASIX) 40 MG tablet TAKE ONE TABLET BY MOUTH ONE TIME DAILY   30 tablet  1  . gabapentin (NEURONTIN) 300 MG capsule Take 1 capsule (300 mg total) by mouth 3 (three) times daily.  90 capsule  0  . ipratropium (ATROVENT HFA) 17 MCG/ACT inhaler Inhale 2 puffs into the  lungs 3 (three) times daily as needed for wheezing.      Marland Kitchen lisinopril (PRINIVIL,ZESTRIL) 20 MG tablet TAKE ONE TABLET BY MOUTH ONE TIME DAILY   30 tablet  3  . magnesium oxide (MAG-OX) 400 MG tablet Take 800 mg by mouth daily.      . mirtazapine (REMERON) 30 MG tablet Take 30 mg by mouth at bedtime.      . montelukast (SINGULAIR) 10 MG tablet Take 1 tablet (10 mg total) by mouth at bedtime.  30 tablet  3  . Multiple Vitamin (MULTIVITAMIN WITH MINERALS) TABS tablet Take 1 tablet by mouth daily.      Marland Kitchen neomycin-bacitracin-polymyxin (NEOSPORIN) ointment Apply 1 application topically as needed for wound care. apply to eye      . potassium chloride SA (K-DUR,KLOR-CON) 20 MEQ tablet Take 2 tablets (40 mEq total) by mouth daily.  60 tablet  11  . sertraline (ZOLOFT) 100 MG tablet Take 150 mg by mouth daily.      Marland Kitchen spironolactone (ALDACTONE) 25 MG tablet TAKE HALF TABLET BY MOUTH DAILY   15 tablet  1  .  tiotropium (SPIRIVA) 18 MCG inhalation capsule Place 1 capsule (18 mcg total) into inhaler and inhale daily.  30 capsule  1  . triamcinolone cream (KENALOG) 0.1 % Apply topically 2 (two) times daily.  30 g  0  . [DISCONTINUED] albuterol (PROVENTIL,VENTOLIN) 90 MCG/ACT inhaler Inhale 2 puffs into the lungs every 6 (six) hours as needed for wheezing.  17 g  0   No current facility-administered medications for this visit.     Objective:   Physical Exam BP 112/78  Pulse 86  Temp(Src) 97.8 F (36.6 C) (Oral)  Ht  (1.727 m)  Wt 188 lb 12.8 oz (85.639 kg)  BMI 28.71 kg/m2 Gen:  Alert, cooperative patient who appears stated age in no acute distress.  Vital signs reviewed. HEENT: EOMI,  MMM Cardiac:  Regular rate and rhythm  Pulm:  Clear to auscultation bilaterally  Abd:  Soft.  NT. Hernia with overlying skin breakdown is essentially unchanged from prior exams.  Ext:  No edema BL ankles Skin:  Petechiae noted BL upper arms.  None noted elsewhere on full skin exam.  Red scaly patches scattered across BL pre-tibial areas.  Also Left thumb.  With large (10 cm) crusted lesion on medial aspect of Right heel.  Scaly.    No results found for this or any previous visit (from the past 72 hour(s)).

## 2013-10-26 NOTE — Patient Instructions (Signed)
I have sent in the Triamcinolone ointment for your legs.  Use this twice a day.  Also make sure to use a moisturizing cream on your legs as well, such as Eucerin cream.  We will get you in to see a dermatologist if this isn't helping.   I have sent in the Singulair to help with your allergies.  Flu shot and labs today.  I will call you with follow-up instructions and lab results in the next couple of days.    It was good to see you again today

## 2013-10-27 LAB — CBC WITH DIFFERENTIAL/PLATELET
BASOS ABS: 0.1 10*3/uL (ref 0.0–0.1)
BASOS PCT: 1 % (ref 0–1)
EOS PCT: 5 % (ref 0–5)
Eosinophils Absolute: 0.5 10*3/uL (ref 0.0–0.7)
HEMATOCRIT: 33.4 % — AB (ref 39.0–52.0)
Hemoglobin: 11.4 g/dL — ABNORMAL LOW (ref 13.0–17.0)
Lymphocytes Relative: 11 % — ABNORMAL LOW (ref 12–46)
Lymphs Abs: 1.1 10*3/uL (ref 0.7–4.0)
MCH: 30.7 pg (ref 26.0–34.0)
MCHC: 34.1 g/dL (ref 30.0–36.0)
MCV: 90 fL (ref 78.0–100.0)
MONO ABS: 1.2 10*3/uL — AB (ref 0.1–1.0)
MONOS PCT: 12 % (ref 3–12)
Neutro Abs: 7 10*3/uL (ref 1.7–7.7)
Neutrophils Relative %: 71 % (ref 43–77)
Platelets: 307 10*3/uL (ref 150–400)
RBC: 3.71 MIL/uL — ABNORMAL LOW (ref 4.22–5.81)
RDW: 14.5 % (ref 11.5–15.5)
WBC: 9.8 10*3/uL (ref 4.0–10.5)

## 2013-10-27 LAB — COMPREHENSIVE METABOLIC PANEL
ALT: 25 U/L (ref 0–53)
AST: 31 U/L (ref 0–37)
Albumin: 4.2 g/dL (ref 3.5–5.2)
Alkaline Phosphatase: 95 U/L (ref 39–117)
BUN: 17 mg/dL (ref 6–23)
CO2: 25 meq/L (ref 19–32)
Calcium: 9.3 mg/dL (ref 8.4–10.5)
Chloride: 89 mEq/L — ABNORMAL LOW (ref 96–112)
Creat: 1.06 mg/dL (ref 0.50–1.35)
Glucose, Bld: 100 mg/dL — ABNORMAL HIGH (ref 70–99)
Potassium: 5.5 mEq/L — ABNORMAL HIGH (ref 3.5–5.3)
SODIUM: 122 meq/L — AB (ref 135–145)
TOTAL PROTEIN: 7.2 g/dL (ref 6.0–8.3)
Total Bilirubin: 0.4 mg/dL (ref 0.2–1.2)

## 2013-10-28 DIAGNOSIS — R21 Rash and other nonspecific skin eruption: Secondary | ICD-10-CM | POA: Insufficient documentation

## 2013-10-28 NOTE — Assessment & Plan Note (Signed)
Singulair refill today.

## 2013-10-28 NOTE — Assessment & Plan Note (Signed)
REchecking today.

## 2013-10-28 NOTE — Assessment & Plan Note (Signed)
Has FU cardiology exam with Dr. [name] next week.   Should be cleared medically at that point for surgery.  Urged him strongly to make an appointment with surgeon to have this fixed.

## 2013-10-28 NOTE — Assessment & Plan Note (Signed)
On extensor surfaces of skin. Looks more like psoriasis today.  Attempting stronger triamcinolone ointment If not improvement, referral to derm

## 2013-10-28 NOTE — Assessment & Plan Note (Signed)
EF improved most recent Echo. Appreciate cards input in his care.   Await their final input regarding patient's surgery

## 2013-10-28 NOTE — Assessment & Plan Note (Signed)
Needs repeat CT scan.   Will contact patient to alert him and place order.

## 2013-11-01 ENCOUNTER — Ambulatory Visit: Payer: No Typology Code available for payment source | Admitting: Internal Medicine

## 2013-11-01 ENCOUNTER — Other Ambulatory Visit: Payer: Self-pay | Admitting: Internal Medicine

## 2013-11-01 ENCOUNTER — Telehealth: Payer: Self-pay | Admitting: Family Medicine

## 2013-11-01 DIAGNOSIS — E875 Hyperkalemia: Secondary | ICD-10-CM

## 2013-11-01 DIAGNOSIS — R911 Solitary pulmonary nodule: Secondary | ICD-10-CM

## 2013-11-01 DIAGNOSIS — I10 Essential (primary) hypertension: Secondary | ICD-10-CM

## 2013-11-01 NOTE — Telephone Encounter (Signed)
Called to discuss lab results with Mr. Theel -- no answer.   - Sodium was same as last time, no changes needed.  - potassium was a little high.  Stop taking for 1 day, then drop down to only once daily. - Come back for repeat potassium later this week.  I will put in orders.  - He also needs repeat CT scan based on lung nodule found about 4 months ago.  I will put this order in now.  Can you schedule this?  (If he needs me to talk to him in person, I'll call him when I get back next Monday to explain).  Thanks,  JW

## 2013-11-02 NOTE — Telephone Encounter (Signed)
Rx was sent to pharmacy electronically. 

## 2013-11-08 ENCOUNTER — Other Ambulatory Visit: Payer: Self-pay | Admitting: Family Medicine

## 2013-11-18 ENCOUNTER — Encounter (HOSPITAL_COMMUNITY): Payer: Self-pay | Admitting: Emergency Medicine

## 2013-11-18 ENCOUNTER — Emergency Department (HOSPITAL_COMMUNITY): Payer: No Typology Code available for payment source

## 2013-11-18 ENCOUNTER — Inpatient Hospital Stay (HOSPITAL_COMMUNITY)
Admission: EM | Admit: 2013-11-18 | Discharge: 2013-11-19 | DRG: 392 | Disposition: A | Discharge status: Home / Self Care | Payer: No Typology Code available for payment source | Attending: Family Medicine | Admitting: Family Medicine

## 2013-11-18 DIAGNOSIS — D649 Anemia, unspecified: Secondary | ICD-10-CM | POA: Diagnosis present

## 2013-11-18 DIAGNOSIS — L409 Psoriasis, unspecified: Secondary | ICD-10-CM | POA: Diagnosis present

## 2013-11-18 DIAGNOSIS — F329 Major depressive disorder, single episode, unspecified: Secondary | ICD-10-CM | POA: Diagnosis present

## 2013-11-18 DIAGNOSIS — J440 Chronic obstructive pulmonary disease with acute lower respiratory infection: Secondary | ICD-10-CM | POA: Diagnosis present

## 2013-11-18 DIAGNOSIS — R109 Unspecified abdominal pain: Secondary | ICD-10-CM | POA: Diagnosis not present

## 2013-11-18 DIAGNOSIS — J45909 Unspecified asthma, uncomplicated: Secondary | ICD-10-CM | POA: Diagnosis present

## 2013-11-18 DIAGNOSIS — E785 Hyperlipidemia, unspecified: Secondary | ICD-10-CM | POA: Diagnosis present

## 2013-11-18 DIAGNOSIS — E871 Hypo-osmolality and hyponatremia: Secondary | ICD-10-CM | POA: Diagnosis present

## 2013-11-18 DIAGNOSIS — I10 Essential (primary) hypertension: Secondary | ICD-10-CM | POA: Diagnosis present

## 2013-11-18 DIAGNOSIS — E872 Acidosis: Secondary | ICD-10-CM | POA: Diagnosis present

## 2013-11-18 DIAGNOSIS — I5042 Chronic combined systolic (congestive) and diastolic (congestive) heart failure: Secondary | ICD-10-CM | POA: Diagnosis present

## 2013-11-18 DIAGNOSIS — F1020 Alcohol dependence, uncomplicated: Secondary | ICD-10-CM | POA: Diagnosis present

## 2013-11-18 DIAGNOSIS — I5023 Acute on chronic systolic (congestive) heart failure: Secondary | ICD-10-CM

## 2013-11-18 DIAGNOSIS — Z79899 Other long term (current) drug therapy: Secondary | ICD-10-CM

## 2013-11-18 DIAGNOSIS — F17210 Nicotine dependence, cigarettes, uncomplicated: Secondary | ICD-10-CM | POA: Diagnosis present

## 2013-11-18 DIAGNOSIS — F419 Anxiety disorder, unspecified: Secondary | ICD-10-CM | POA: Diagnosis present

## 2013-11-18 DIAGNOSIS — Z8701 Personal history of pneumonia (recurrent): Secondary | ICD-10-CM | POA: Diagnosis not present

## 2013-11-18 DIAGNOSIS — I429 Cardiomyopathy, unspecified: Secondary | ICD-10-CM | POA: Diagnosis present

## 2013-11-18 DIAGNOSIS — Z7982 Long term (current) use of aspirin: Secondary | ICD-10-CM | POA: Diagnosis not present

## 2013-11-18 DIAGNOSIS — K429 Umbilical hernia without obstruction or gangrene: Secondary | ICD-10-CM | POA: Diagnosis present

## 2013-11-18 DIAGNOSIS — J189 Pneumonia, unspecified organism: Secondary | ICD-10-CM

## 2013-11-18 DIAGNOSIS — K529 Noninfective gastroenteritis and colitis, unspecified: Principal | ICD-10-CM | POA: Diagnosis present

## 2013-11-18 DIAGNOSIS — F1010 Alcohol abuse, uncomplicated: Secondary | ICD-10-CM

## 2013-11-18 LAB — COMPREHENSIVE METABOLIC PANEL
ALK PHOS: 129 U/L — AB (ref 39–117)
ALT: 29 U/L (ref 0–53)
AST: 52 U/L — AB (ref 0–37)
Albumin: 3.2 g/dL — ABNORMAL LOW (ref 3.5–5.2)
Anion gap: 21 — ABNORMAL HIGH (ref 5–15)
BILIRUBIN TOTAL: 0.2 mg/dL — AB (ref 0.3–1.2)
BUN: 8 mg/dL (ref 6–23)
CO2: 18 meq/L — AB (ref 19–32)
CREATININE: 0.9 mg/dL (ref 0.50–1.35)
Calcium: 8.7 mg/dL (ref 8.4–10.5)
Chloride: 90 mEq/L — ABNORMAL LOW (ref 96–112)
GFR calc Af Amer: >90 mL/min (ref >90)
Glucose, Bld: 137 mg/dL — ABNORMAL HIGH (ref 70–99)
POTASSIUM: 4.5 meq/L (ref 3.7–5.3)
Sodium: 129 mEq/L — ABNORMAL LOW (ref 137–147)
Total Protein: 7.5 g/dL (ref 6.0–8.3)

## 2013-11-18 LAB — URINALYSIS, ROUTINE W REFLEX MICROSCOPIC
BILIRUBIN URINE: NEGATIVE
GLUCOSE, UA: NEGATIVE mg/dL
HGB URINE DIPSTICK: NEGATIVE
KETONES UR: NEGATIVE mg/dL
LEUKOCYTES UA: NEGATIVE
Nitrite: NEGATIVE
PH: 6 (ref 5.0–8.0)
PROTEIN: NEGATIVE mg/dL
Specific Gravity, Urine: 1.009 (ref 1.005–1.030)
Urobilinogen, UA: 0.2 mg/dL (ref 0.0–1.0)

## 2013-11-18 LAB — CBC WITH DIFFERENTIAL/PLATELET
Basophils Absolute: 0.1 10*3/uL (ref 0.0–0.1)
Basophils Relative: 1 % (ref 0–1)
Eosinophils Absolute: 0.2 10*3/uL (ref 0.0–0.7)
Eosinophils Relative: 2 % (ref 0–5)
HEMATOCRIT: 31.4 % — AB (ref 39.0–52.0)
HEMOGLOBIN: 10.7 g/dL — AB (ref 13.0–17.0)
LYMPHS PCT: 15 % (ref 12–46)
Lymphs Abs: 1.2 10*3/uL (ref 0.7–4.0)
MCH: 31.6 pg (ref 26.0–34.0)
MCHC: 34.1 g/dL (ref 30.0–36.0)
MCV: 92.6 fL (ref 78.0–100.0)
MONO ABS: 1 10*3/uL (ref 0.1–1.0)
MONOS PCT: 12 % (ref 3–12)
NEUTROS ABS: 5.8 10*3/uL (ref 1.7–7.7)
NEUTROS PCT: 70 % (ref 43–77)
Platelets: 317 10*3/uL (ref 150–400)
RBC: 3.39 MIL/uL — ABNORMAL LOW (ref 4.22–5.81)
RDW: 13.6 % (ref 11.5–15.5)
WBC: 8.2 10*3/uL (ref 4.0–10.5)

## 2013-11-18 LAB — PROTIME-INR
INR: 1.02 (ref 0.00–1.49)
Prothrombin Time: 13.5 seconds (ref 11.6–15.2)

## 2013-11-18 LAB — LACTIC ACID, PLASMA: LACTIC ACID, VENOUS: 3.3 mmol/L — AB (ref 0.5–2.2)

## 2013-11-18 LAB — AMMONIA: AMMONIA: 35 umol/L (ref 11–60)

## 2013-11-18 LAB — ETHANOL: Alcohol, Ethyl (B): 248 mg/dL — ABNORMAL HIGH (ref 0–11)

## 2013-11-18 LAB — LIPASE, BLOOD: LIPASE: 21 U/L (ref 11–59)

## 2013-11-18 MED ORDER — LORAZEPAM 1 MG PO TABS
1.0000 mg | ORAL_TABLET | Freq: Four times a day (QID) | ORAL | Status: DC | PRN
  Administered 2013-11-19: 1 mg via ORAL
  Filled 2013-11-18 (×2): qty 1

## 2013-11-18 MED ORDER — VITAMIN D 1000 UNITS PO TABS
1000.0000 [IU] | ORAL_TABLET | Freq: Every day | ORAL | Status: DC
  Administered 2013-11-19: 1000 [IU] via ORAL
  Filled 2013-11-18: qty 1

## 2013-11-18 MED ORDER — FUROSEMIDE 40 MG PO TABS
40.0000 mg | ORAL_TABLET | Freq: Every day | ORAL | Status: DC
  Administered 2013-11-18 – 2013-11-19 (×2): 40 mg via ORAL
  Filled 2013-11-18 (×2): qty 1

## 2013-11-18 MED ORDER — ADULT MULTIVITAMIN W/MINERALS CH
1.0000 | ORAL_TABLET | Freq: Every day | ORAL | Status: DC
  Administered 2013-11-18 – 2013-11-19 (×2): 1 via ORAL
  Filled 2013-11-18 (×2): qty 1

## 2013-11-18 MED ORDER — THIAMINE HCL 100 MG/ML IJ SOLN
100.0000 mg | Freq: Once | INTRAMUSCULAR | Status: AC
  Administered 2013-11-18: 100 mg via INTRAVENOUS
  Filled 2013-11-18: qty 2

## 2013-11-18 MED ORDER — SODIUM CHLORIDE 0.9 % IV BOLUS (SEPSIS)
2000.0000 mL | Freq: Once | INTRAVENOUS | Status: AC
  Administered 2013-11-18: 2000 mL via INTRAVENOUS

## 2013-11-18 MED ORDER — TIOTROPIUM BROMIDE MONOHYDRATE 18 MCG IN CAPS
18.0000 ug | ORAL_CAPSULE | Freq: Every day | RESPIRATORY_TRACT | Status: DC
  Administered 2013-11-19: 18 ug via RESPIRATORY_TRACT
  Filled 2013-11-18: qty 5

## 2013-11-18 MED ORDER — SODIUM CHLORIDE 0.9 % IV SOLN
INTRAVENOUS | Status: DC
  Administered 2013-11-19 (×2): via INTRAVENOUS

## 2013-11-18 MED ORDER — ALBUTEROL SULFATE (2.5 MG/3ML) 0.083% IN NEBU
5.0000 mg | INHALATION_SOLUTION | Freq: Once | RESPIRATORY_TRACT | Status: AC
  Administered 2013-11-18: 5 mg via RESPIRATORY_TRACT
  Filled 2013-11-18: qty 6

## 2013-11-18 MED ORDER — IPRATROPIUM-ALBUTEROL 0.5-2.5 (3) MG/3ML IN SOLN
3.0000 mL | Freq: Four times a day (QID) | RESPIRATORY_TRACT | Status: DC | PRN
  Administered 2013-11-18: 3 mL via RESPIRATORY_TRACT
  Filled 2013-11-18: qty 3

## 2013-11-18 MED ORDER — PANTOPRAZOLE SODIUM 40 MG PO TBEC
40.0000 mg | DELAYED_RELEASE_TABLET | Freq: Every day | ORAL | Status: DC
  Administered 2013-11-18 – 2013-11-19 (×2): 40 mg via ORAL
  Filled 2013-11-18 (×2): qty 1

## 2013-11-18 MED ORDER — LORAZEPAM 2 MG/ML IJ SOLN
1.0000 mg | Freq: Once | INTRAMUSCULAR | Status: AC
  Administered 2013-11-18: 1 mg via INTRAVENOUS
  Filled 2013-11-18: qty 1

## 2013-11-18 MED ORDER — ASPIRIN EC 81 MG PO TBEC
81.0000 mg | DELAYED_RELEASE_TABLET | Freq: Every day | ORAL | Status: DC
  Administered 2013-11-19: 81 mg via ORAL
  Filled 2013-11-18: qty 1

## 2013-11-18 MED ORDER — THIAMINE HCL 100 MG/ML IJ SOLN
100.0000 mg | Freq: Every day | INTRAMUSCULAR | Status: DC
  Filled 2013-11-18: qty 1

## 2013-11-18 MED ORDER — LEVOFLOXACIN IN D5W 750 MG/150ML IV SOLN
750.0000 mg | INTRAVENOUS | Status: DC
  Administered 2013-11-19: 750 mg via INTRAVENOUS
  Filled 2013-11-18 (×2): qty 150

## 2013-11-18 MED ORDER — LORAZEPAM 1 MG PO TABS: 0.0000 mg | ORAL_TABLET | Freq: Two times a day (BID) | ORAL | Status: DC

## 2013-11-18 MED ORDER — LORAZEPAM 1 MG PO TABS
0.0000 mg | ORAL_TABLET | Freq: Four times a day (QID) | ORAL | Status: DC
  Administered 2013-11-19: 2 mg via ORAL
  Administered 2013-11-19 (×2): 1 mg via ORAL
  Filled 2013-11-18: qty 4
  Filled 2013-11-18: qty 2

## 2013-11-18 MED ORDER — VANCOMYCIN HCL IN DEXTROSE 1-5 GM/200ML-% IV SOLN
1000.0000 mg | Freq: Once | INTRAVENOUS | Status: DC
  Filled 2013-11-18: qty 200

## 2013-11-18 MED ORDER — IPRATROPIUM-ALBUTEROL 20-100 MCG/ACT IN AERS: 2.0000 | INHALATION_SPRAY | Freq: Four times a day (QID) | RESPIRATORY_TRACT | Status: DC | PRN

## 2013-11-18 MED ORDER — LISINOPRIL 20 MG PO TABS
20.0000 mg | ORAL_TABLET | Freq: Every day | ORAL | Status: DC
  Administered 2013-11-19: 20 mg via ORAL
  Filled 2013-11-18: qty 1

## 2013-11-18 MED ORDER — FOLIC ACID 1 MG PO TABS
1.0000 mg | ORAL_TABLET | Freq: Every day | ORAL | Status: DC
  Administered 2013-11-18 – 2013-11-19 (×2): 1 mg via ORAL
  Filled 2013-11-18 (×2): qty 1

## 2013-11-18 MED ORDER — VITAMIN B-1 100 MG PO TABS
100.0000 mg | ORAL_TABLET | Freq: Every day | ORAL | Status: DC
  Administered 2013-11-19: 100 mg via ORAL
  Filled 2013-11-18: qty 1

## 2013-11-18 MED ORDER — ATORVASTATIN CALCIUM 40 MG PO TABS
40.0000 mg | ORAL_TABLET | Freq: Every day | ORAL | Status: DC
  Administered 2013-11-19: 40 mg via ORAL
  Filled 2013-11-18: qty 1

## 2013-11-18 MED ORDER — CARVEDILOL 6.25 MG PO TABS
6.2500 mg | ORAL_TABLET | Freq: Two times a day (BID) | ORAL | Status: DC
  Administered 2013-11-19 (×2): 6.25 mg via ORAL
  Filled 2013-11-18 (×3): qty 1

## 2013-11-18 MED ORDER — LORAZEPAM 2 MG/ML IJ SOLN
1.0000 mg | Freq: Four times a day (QID) | INTRAMUSCULAR | Status: DC | PRN
  Administered 2013-11-18: 1 mg via INTRAVENOUS
  Filled 2013-11-18: qty 1

## 2013-11-18 MED ORDER — HEPARIN SODIUM (PORCINE) 5000 UNIT/ML IJ SOLN
5000.0000 [IU] | Freq: Three times a day (TID) | INTRAMUSCULAR | Status: DC
  Administered 2013-11-19 (×2): 5000 [IU] via SUBCUTANEOUS
  Filled 2013-11-18 (×4): qty 1

## 2013-11-18 MED ORDER — IOHEXOL 300 MG/ML  SOLN
50.0000 mL | Freq: Once | INTRAMUSCULAR | Status: AC | PRN
  Administered 2013-11-18: 50 mL via ORAL

## 2013-11-18 MED ORDER — FENTANYL CITRATE 0.05 MG/ML IJ SOLN
100.0000 ug | Freq: Once | INTRAMUSCULAR | Status: AC
  Administered 2013-11-18: 100 ug via INTRAVENOUS
  Filled 2013-11-18: qty 2

## 2013-11-18 MED ORDER — IOHEXOL 300 MG/ML  SOLN
100.0000 mL | Freq: Once | INTRAMUSCULAR | Status: AC | PRN
  Administered 2013-11-18: 100 mL via INTRAVENOUS

## 2013-11-18 MED ORDER — METRONIDAZOLE IN NACL 5-0.79 MG/ML-% IV SOLN
500.0000 mg | Freq: Three times a day (TID) | INTRAVENOUS | Status: DC
  Administered 2013-11-19 (×3): 500 mg via INTRAVENOUS
  Filled 2013-11-18 (×5): qty 100

## 2013-11-18 MED ORDER — SPIRONOLACTONE 12.5 MG HALF TABLET
12.5000 mg | ORAL_TABLET | Freq: Every day | ORAL | Status: DC
  Administered 2013-11-19: 12.5 mg via ORAL
  Filled 2013-11-18: qty 1

## 2013-11-18 MED ORDER — CLONAZEPAM 0.5 MG PO TABS: 0.5000 mg | ORAL_TABLET | Freq: Two times a day (BID) | ORAL | Status: DC | PRN

## 2013-11-18 MED ORDER — GABAPENTIN 300 MG PO CAPS
300.0000 mg | ORAL_CAPSULE | Freq: Three times a day (TID) | ORAL | Status: DC
  Administered 2013-11-18 – 2013-11-19 (×3): 300 mg via ORAL
  Filled 2013-11-18 (×4): qty 1

## 2013-11-18 MED ORDER — THIAMINE HCL 100 MG/ML IJ SOLN
Freq: Once | INTRAVENOUS | Status: AC
  Administered 2013-11-18: 22:00:00 via INTRAVENOUS
  Filled 2013-11-18: qty 1000

## 2013-11-18 MED ORDER — PIPERACILLIN-TAZOBACTAM 3.375 G IVPB
3.3750 g | Freq: Once | INTRAVENOUS | Status: DC
  Filled 2013-11-18: qty 50

## 2013-11-18 NOTE — ED Notes (Signed)
Hospitalist at bedside 

## 2013-11-18 NOTE — ED Provider Notes (Signed)
CSN: [id number]     Arrival date & time 11/18/13  1523 History   First MD Initiated Contact with Patient 11/18/13 1555     Chief Complaint  Patient presents with  . Abdominal Pain     (Consider location/radiation/quality/duration/timing/severity/associated sxs/prior Treatment) HPI [place] of low abdominal pain constant for approximately a week associated symptoms include diminished appetite. No vomiting no fever. No treatment prior to coming here. He did eat slight 2 slices of pizza today his last alcohol intake was 3 hours ago. No treatment prior to coming here. No blood per. No nausea. Pain is nonradiating moderate to severe, constant. Nothing makes symptoms better or worse. Past Medical History  Diagnosis Date  . CHF (congestive heart failure) 10/2010    ECHO:  EF 40%, Grade II diastolic dysfunction  . Alcoholism   . Allergy     seasonal   . Anxiety   . Depression   . Asthma   . Hyperlipidemia   . Hypertension   . Seizures    psoriasis Past Surgical History  Procedure Laterality Date  . Removal of ingrown toenail     Family History  Problem Relation Age of Onset  . Alzheimer's disease Mother   . Diabetes Mother   . Heart disease Maternal Uncle    History  Substance Use Topics  . Smoking status: Current Every Day Smoker -- 1.00 packs/day for 40 years    Types: Cigarettes  . Smokeless tobacco: Never Used     Comment: currently smokes less than 1 ppd  . Alcohol Use: Yes     Comment: 4-5 burbon's a day for the past 15 years    Review of Systems  Constitutional: Positive for appetite change.  Respiratory: Positive for shortness of breath.        Chronic dyspnea  Gastrointestinal: Positive for abdominal pain and diarrhea.  Skin: Positive for rash.       Rash on legs times several months, unchanged  All other systems reviewed and are negative.     Allergies  Review of patient's allergies indicates no known allergies.  Home Medications   Prior to Admission  medications   Medication Sig Start Date End Date Taking? Authorizing Provider  acetaminophen (TYLENOL) 500 MG tablet Take 1,500 mg by mouth every 6 (six) hours as needed for headache (headache).    Yes Historical Provider, MD  aspirin EC 81 MG EC tablet Take 1 tablet (81 mg total) by mouth daily. 11/13/12  Yes Renae Fickle, MD  atorvastatin (LIPITOR) 40 MG tablet Take 1 tablet (40 mg total) by mouth daily at 6 PM. Need appointment for future refills. 11/02/13  Yes Chrystie Nose, MD  B-Complex CAPS Take 1 capsule by mouth daily.   Yes Historical Provider, MD  carvedilol (COREG) 6.25 MG tablet Take 1 tablet (6.25 mg total) by mouth 2 (two) times daily with a meal. <please schedule appointment for future refills> 09/21/13  Yes Chrystie Nose, MD  Cholecalciferol (VITAMIN D-3) 1000 UNITS CAPS Take 1,000 Units by mouth daily.   Yes Historical Provider, MD  clonazePAM (KLONOPIN) 0.5 MG tablet Take 0.5 mg by mouth 2 (two) times daily as needed for anxiety (anxiety).    Yes Historical Provider, MD  Ephedrine-Guaifenesin (PRIMATENE ASTHMA) 12.5-200 MG TABS Take 2 tablets by mouth 2 (two) times daily as needed (shortness of breath).    Yes Historical Provider, MD  fluticasone Aleda Grana) 50 MCG/ACT nasal spray Use two sprays in each nostril daily 11/08/13  Yes Newt Lukes  Gwendolyn Grant, MD  furosemide (LASIX) 40 MG tablet Take 1 tablet (40 mg total) by mouth daily. Need appointment for future refills. 11/02/13  Yes Chrystie Nose, MD  gabapentin (NEURONTIN) 300 MG capsule Take 1 capsule (300 mg total) by mouth 3 (three) times daily. 06/17/13  Yes Kathlen Mody, MD  ipratropium (ATROVENT HFA) 17 MCG/ACT inhaler Inhale 2 puffs into the lungs 3 (three) times daily as needed for wheezing (wheezing).    Yes Historical Provider, MD  Ipratropium-Albuterol (COMBIVENT RESPIMAT) 20-100 MCG/ACT AERS respimat Inhale 2 puffs into the lungs every 6 (six) hours as needed for wheezing (wheezing).   Yes Historical Provider, MD   lisinopril (PRINIVIL,ZESTRIL) 20 MG tablet Take 20 mg by mouth daily.   Yes Historical Provider, MD  magnesium oxide (MAG-OX) 400 MG tablet Take 800 mg by mouth daily.   Yes Historical Provider, MD  montelukast (SINGULAIR) 10 MG tablet Take 1 tablet (10 mg total) by mouth at bedtime. 10/26/13  Yes Tobey Grim, MD  Multiple Vitamin (MULTIVITAMIN WITH MINERALS) TABS tablet Take 1 tablet by mouth daily.   Yes Historical Provider, MD  potassium chloride SA (K-DUR,KLOR-CON) 20 MEQ tablet Take 2 tablets (40 mEq total) by mouth daily. 12/01/12  Yes Chrystie Nose, MD  spironolactone (ALDACTONE) 25 MG tablet Take 0.5 tablets (12.5 mg total) by mouth daily. Need appointment for future refills. 11/02/13  Yes Chrystie Nose, MD  tiotropium (SPIRIVA) 18 MCG inhalation capsule Place 1 capsule (18 mcg total) into inhaler and inhale daily. 06/17/13   Kathlen Mody, MD  triamcinolone (KENALOG) 0.025 % ointment Apply 1 application topically 2 (two) times daily. 10/26/13   Tobey Grim, MD   BP 116/70  Pulse 127  Temp(Src) 98.5 F (36.9 C) (Oral)  Resp 18  SpO2 100% Physical Exam  Nursing note and vitals reviewed. Constitutional: No distress.  Chronically ill-appearing  HENT:  Head: Normocephalic and atraumatic.  Mucous membranes dry  Eyes: Conjunctivae are normal. Pupils are equal, round, and reactive to light.  Neck: Neck supple. No tracheal deviation present. No thyromegaly present.  Cardiovascular: Regular rhythm.   No murmur heard. Tachycardic  Pulmonary/Chest: Effort normal.  Diffuse rhonchi  Abdominal: Soft. Bowel sounds are normal. He exhibits no distension. There is no tenderness.  Baseball sized umbilical hernia, minimally tender, soft, not warm or red, nonreducible. Minimally tender over lower quadrants bilaterally and umbilical hernia  Genitourinary:  Genitalia normal male  Musculoskeletal: Normal range of motion. He exhibits no edema and no tenderness.  Neurological: He is  alert. Coordination normal.  Skin: Skin is warm and dry. No rash noted.  Psychiatric: He has a normal mood and affect.    ED Course  Procedures (including critical care time) Labs Review Labs Reviewed  CBC WITH DIFFERENTIAL  COMPREHENSIVE METABOLIC PANEL  LIPASE, BLOOD  URINALYSIS, ROUTINE W REFLEX MICROSCOPIC  ETHANOL  URINE RAPID DRUG SCREEN (HOSP PERFORMED)   Cardiac monitor showed sinus tachycardia approximately 120 beats per minute Imaging Review No results found.   EKG Interpretation None     5:30 PM pain is improved after treatment with intravenous opioids and intravenous fluids. He is requesting nebulized treatment. Ordered by me  7:30 PM patient appears tremulous and tachycardic. He may be exhibiting mild withdrawal from alcoholstes breasthing improved after neb treatnment Results for orders placed during the hospital encounter of 11/18/13  CBC WITH DIFFERENTIAL      Result Value Ref Range   WBC 8.2  4.0 - 10.5 K/uL  RBC 3.39 (*) 4.22 - 5.81 MIL/uL   Hemoglobin 10.7 (*) 13.0 - 17.0 g/dL   HCT 16.1 (*) 09.6 - 04.5 %   MCV 92.6  78.0 - 100.0 fL   MCH 31.6  26.0 - 34.0 pg   MCHC 34.1  30.0 - 36.0 g/dL   RDW 40.9  81.1 - 91.4 %   Platelets 317  150 - 400 K/uL   Neutrophils Relative % 70  43 - 77 %   Neutro Abs 5.8  1.7 - 7.7 K/uL   Lymphocytes Relative 15  12 - 46 %   Lymphs Abs 1.2  0.7 - 4.0 K/uL   Monocytes Relative 12  3 - 12 %   Monocytes Absolute 1.0  0.1 - 1.0 K/uL   Eosinophils Relative 2  0 - 5 %   Eosinophils Absolute 0.2  0.0 - 0.7 K/uL   Basophils Relative 1  0 - 1 %   Basophils Absolute 0.1  0.0 - 0.1 K/uL  COMPREHENSIVE METABOLIC PANEL      Result Value Ref Range   Sodium 129 (*) 137 - 147 mEq/L   Potassium 4.5  3.7 - 5.3 mEq/L   Chloride 90 (*) 96 - 112 mEq/L   CO2 18 (*) 19 - 32 mEq/L   Glucose, Bld 137 (*) 70 - 99 mg/dL   BUN 8  6 - 23 mg/dL   Creatinine, Ser 7.82  0.50 - 1.35 mg/dL   Calcium 8.7  8.4 - 95.6 mg/dL   Total Protein 7.5   6.0 - 8.3 g/dL   Albumin 3.2 (*) 3.5 - 5.2 g/dL   AST 52 (*) 0 - 37 U/L   ALT 29  0 - 53 U/L   Alkaline Phosphatase 129 (*) 39 - 117 U/L   Total Bilirubin 0.2 (*) 0.3 - 1.2 mg/dL   GFR calc non Af Amer >90  >90 mL/min   GFR calc Af Amer >90  >90 mL/min   Anion gap 21 (*) 5 - 15  URINALYSIS, ROUTINE W REFLEX MICROSCOPIC      Result Value Ref Range   Color, Urine YELLOW  YELLOW   APPearance CLEAR  CLEAR   Specific Gravity, Urine 1.009  1.005 - 1.030   pH 6.0  5.0 - 8.0   Glucose, UA NEGATIVE  NEGATIVE mg/dL   Hgb urine dipstick NEGATIVE  NEGATIVE   Bilirubin Urine NEGATIVE  NEGATIVE   Ketones, ur NEGATIVE  NEGATIVE mg/dL   Protein, ur NEGATIVE  NEGATIVE mg/dL   Urobilinogen, UA 0.2  0.0 - 1.0 mg/dL   Nitrite NEGATIVE  NEGATIVE   Leukocytes, UA NEGATIVE  NEGATIVE  ETHANOL      Result Value Ref Range   Alcohol, Ethyl (B) 248 (*) 0 - 11 mg/dL   Ct Abdomen Pelvis W Contrast  11/18/2013   CLINICAL DATA:  Umbilical hernia, abdominal pain and tenderness. Diarrhea, no vomiting, symptoms began 1 week ago.  EXAM: CT ABDOMEN AND PELVIS WITH CONTRAST  TECHNIQUE: Multidetector CT imaging of the abdomen and pelvis was performed using the standard protocol following bolus administration of intravenous contrast.  CONTRAST:  50mL OMNIPAQUE IOHEXOL 300 MG/ML SOLN, OMNIPAQUE IOHEXOL 300 MG/ML SOLN  COMPARISON:  Abdominal ultrasound Jun 13, 2013 and CT of the abdomen and pelvis September 07, 2012.  FINDINGS: LUNG BASES: Included view of the lung bases are clear. Visualized heart and pericardium are unremarkable.  SOLID ORGANS: Mild hepatomegaly. The liver is diffusely hypodense most consistent with hepatic steatosis.  Cholelithiasis without superimposed inflammatory changes. Spleen, adrenal glands and pancreas are normal.  GASTROINTESTINAL TRACT: The stomach, small and large bowel are normal in course and caliber without inflammatory changes. Mild ascending colonic wall thickening, and fatty intramural  changes. Normal appendix.  KIDNEYS/ URINARY TRACT: Kidneys are orthotopic, demonstrating symmetric enhancement. Nonobstructing 3 mm LEFT lower pole nephrolithiasis. No hydronephrosis or solid renal masses. 21 mm exophytic LEFT upper pole cyst. The unopacified ureters are normal in course and caliber. Delayed imaging through the kidneys demonstrates symmetric prompt contrast excretion within the proximal urinary collecting system. Urinary bladder is partially distended and unremarkable.  PERITONEUM/RETROPERITONEUM: No intraperitoneal free fluid nor free air. Aortoiliac vessels are normal in course and caliber, mild calcific atherosclerosis of. No lymphadenopathy by CT size criteria. Prostate is enlarged, 5.4 x 4.8 cm.  SOFT TISSUE/OSSEOUS STRUCTURES: Wide necked (4.9 cm ) fat containing ventral hernia is unchanged. Mild chronic L4 compression fracture, new. The moderate L1 burst fracture with approximately 50% height loss and 3-4 mm retropulsed bony fragments appears chronic though, new from prior imaging.  IMPRESSION: Ascending colonic wall thickening without superimposed inflammation, suggest chronic colitis without obstruction.  Large fat containing umbilical hernia, stable.  Chronic appearing lumbar spine fractures including moderate L1 burst fracture are new from prior imaging.  Mild hepatomegaly and steatosis. Cholelithiasis without CT findings of acute cholecystitis.   Electronically Signed   By: Awilda Metroourtnay  Bloomer   On: 11/18/2013 19:05   Dg Chest Portable 1 View  11/18/2013   CLINICAL DATA:  Cough, weakness  EXAM: PORTABLE CHEST - 1 VIEW  COMPARISON:  CT chest dated 06/14/2013  FINDINGS: Scattered patchy opacities in the right upper lobe, right perihilar region, lingula, and left lower lobe. When correlating with the prior CT, most of this appearance is chronic. Superimposed left lower lobe pneumonia is not entirely excluded. No pleural effusion or pneumothorax.  The heart is normal in size.  IMPRESSION:  Scattered patchy opacities bilaterally, most of which is chronic.  Superimposed left lower lobe pneumonia is not entirely excluded.   Electronically Signed   By: Charline BillsSriyesh  Krishnan M.D.   On: 11/18/2013 18:37   Chest xray viewed by me . After consultation with the hospital pharmacist MDM  Pt to be started vancomycin and Zosyn which will treat both colitis and pneumonia. He should be watched for alcohol withdrawal. He is a new compromise given his chronic alcoholism. Spoke with Dr. Alvester MorinNewton plan admit telemetry Diagnoses #1 colitis #2 Community acquired pneumonia #3 hyponatremia #4 alcohol abuse #5 hyperglycemia #6 anemia Final diagnoses:  None        Doug SouSam Jenie Parish, MD 11/18/13 2039

## 2013-11-18 NOTE — ED Notes (Signed)
MD at bedside. 

## 2013-11-18 NOTE — H&P (Signed)
Hospitalist Admission History and Physical  Patient name: Juan [name] Medical record number: [id number] Date of birth: 1950-07-15 Age: 63 y.o. Gender: male  Primary Care Provider: Renold Don, MD  Chief Complaint: abd pain, CAP, ETOH abuse History of Present Illness:This is a 63 y.o. year old male with significant past medical history of alcohol abuse,  1 1/2 PPD tobacco abuse, chronic diastolic heart failure based on 09/2013 ECHO, ETOH abuse, HTN, psoriasis presenting with abd pain, CAP, ETOH abuse. Pt states that he has had intermittent abdominal pain over the past 2 weeks. Abdominal pain is generalized but mainly in the lower abdomen. Has also had some intermittent episodes of nonbloody diarrhea as well as nausea. Has had few episodes of nonbilious nonbloody emesis as well. Patient also reports daily alcohol use. Adolphus [place] is his drink of choice. Drinks several cups daily. Had at least 3 cups of bourbon with Coca-Cola today. Denies any history of hematemesis in the past. States that he has not seen a stomach doctor before. No fevers or chills at home. Has also had some mild cough over the same time frame. Based on inhaler use. Denies any wheezing or increased inhaler use at home. Still smoking 1-1/2 packs per day. 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 However, he does not want to go to [facility] for his care." I just don't feel comfortable going care" On presentation to the ER, temperature 98.5, heart rate in the 110s to 130s, respirations intense 20s, blood pressure in the 110s to 140s over 70s to 20s. Satting greater than 94% room air. White blood cell count 8.2, hemoglobin 10.7, platelet count 317. Sodium 129, bicarbonate 18, glucose 137, creatinine 0.9, AST 52, ALT 29, alkaline phosphatase 129. Albumin 3.2. Alcohol level 248. CT of the abdomen and pelvis shows descending colonic wall thickening without superimposed inflammation suggestive of chronic colitis without obstruction  as well as a large fat-containing umbilical hernia but stable. Stable lumbar spine fractures. Mild hepatomegaly and steatosis and cholelithiasis without acute cholecystitis. Chest x-ray shows scattered patchy opacities bilaterally with superimposed left lower lobe pneumonia.   Assessment and Plan: Juan [name] is a 63 y.o. year old male presenting with CAP, ETOH abuse, abd pain   Active Problems:   Colitis   Abdominal pain   CAP (community acquired pneumonia)   ETOH abuse   1- Abd pain  -I do not appreciate a significant amount of abdominal tenderness to palpation on exam today despite vigorous palpation. -Given comorbidities and finding of chronic colitis on CT scan, however will place on coverage with Levaquin and Flagyl. -Should also give adequate coverage for community-acquired pneumonia -Also check a lipase -Stool studies -PPI -Consider gastroenterology consult in the morning  2-community-acquired pneumonia -IV Levaquin for coverage in the setting of above -Blood cultures, urine strep Legionella -High aspiration risk given history of emesis and history of aspiration pneumonia in the past -No respiratory distress, hypoxia currently -Anticipate transition to Augmentin outpatient -? Concomitant subacute COPD -No wheezing on exam -Continue outpatient inhaler regimen  3-alcohol abuse -Alcohol level CCXL on admission -C1 protocol -Noted tremulousness on exam-likely secondary to alcohol abuse -May benefit from long-acting Librium -Check ammonia level -Noted concomitant metabolic acidosis with anion gap-likely secondary to ethanol -Check lactate -banana bag  -pt minimally motivated to quit smoking or drinking  4-diastolic CHF/HTN -BP stable  -euvolemic to dry on exam  -cont home regimen  -strict Is and Os and daily weights  5-Umbilical hernia -stable on exam today -no clinical  signs of strangulation   FEN/GI: clears. PPI Prophylaxis: sub q heparin   Disposition: pending further evaluation  Code Status:Full Code    Patient Active Problem List   Diagnosis Date Noted  . Colitis 11/18/2013  . Abdominal pain 11/18/2013  . Rash and nonspecific skin eruption 10/28/2013  . Bronchiectasis without acute exacerbation 06/30/2013  . Lumbago 06/30/2013  . Lung nodule seen on imaging study 06/15/2013  . Chronic systolic heart failure 06/13/2013  . Tobacco abuse 06/13/2013  . Transaminitis 06/13/2013  . Cardiomyopathy, EF 30% awaiting, nuc study  11/13/2012  . Acute on chronic systolic heart failure 11/11/2012  . RBBB 11/09/2012  . Hyponatremia 11/08/2012  . Aspiration pneumonia 11/08/2012  . Seizure due to alcohol withdrawal 11/08/2012  . Incarcerated umbilical hernia 08/06/2012  . Smoker unmotivated to quit 07/22/2012  . Tremor 07/30/2011  . HTN (hypertension) 07/30/2011  . Hepatomegaly 11/05/2010  . Depression 11/05/2010  . Anxiety 11/05/2010  . Allergic rhinitis 11/05/2010  . Alcoholism 11/05/2010   Past Medical History: Past Medical History  Diagnosis Date  . CHF (congestive heart failure) 10/2010    ECHO:  EF 40%, Grade II diastolic dysfunction  . Alcoholism   . Allergy     seasonal   . Anxiety   . Depression   . Asthma   . Hyperlipidemia   . Hypertension   . Seizures     Past Surgical History: Past Surgical History  Procedure Laterality Date  . Removal of ingrown toenail      Social History: History   Social History  . Marital Status: Married    Spouse Name: N/A    Number of Children: N/A  . Years of Education: N/A   Social History Main Topics  . Smoking status: Current Every Day Smoker -- 1.00 packs/day for 40 years    Types: Cigarettes  . Smokeless tobacco: Never Used     Comment: currently smokes less than 1 ppd  . Alcohol Use: Yes     Comment: 4-5 burbon's a day for the past 15 years  . Drug Use: No  . Sexual Activity: Yes   Other Topics Concern  . None   Social History Narrative  . None     Family History: Family History  Problem Relation Age of Onset  . Alzheimer's disease Mother   . Diabetes Mother   . Heart disease Maternal Uncle     Allergies: No Known Allergies  Current Facility-Administered Medications  Medication Dose Route Frequency Provider Last Rate Last Dose  . 0.9 %  sodium chloride infusion   Intravenous Continuous Doree AlbeeSteven Seylah Wernert, MD      . aspirin EC tablet 81 mg  81 mg Oral Daily Doree AlbeeSteven Kendrell Lottman, MD      . Melene Muller[START ON 11/19/2013] atorvastatin (LIPITOR) tablet 40 mg  40 mg Oral q1800 Doree AlbeeSteven Nike Southers, MD      . Melene Muller[START ON 11/19/2013] carvedilol (COREG) tablet 6.25 mg  6.25 mg Oral BID WC Doree AlbeeSteven Yuma Blucher, MD      . clonazePAM Scarlette Calico(KLONOPIN) tablet 0.5 mg  0.5 mg Oral BID PRN Doree AlbeeSteven Anesha Hackert, MD      . folic acid (FOLVITE) tablet 1 mg  1 mg Oral Daily Doug SouSam Jacubowitz, MD      . furosemide (LASIX) tablet 40 mg  40 mg Oral Daily Doree AlbeeSteven Loretta Kluender, MD      . gabapentin (NEURONTIN) capsule 300 mg  300 mg Oral TID Doree AlbeeSteven Colby Catanese, MD      . Melene Muller[START ON 11/19/2013] heparin injection 5,000  Units  5,000 Units Subcutaneous 3 times per day Doree Albee, MD      . Ipratropium-Albuterol (COMBIVENT) respimat 2 puff  2 puff Inhalation Q6H PRN Doree Albee, MD      . lisinopril (PRINIVIL,ZESTRIL) tablet 20 mg  20 mg Oral Daily Doree Albee, MD      . LORazepam (ATIVAN) tablet 1 mg  1 mg Oral Q6H PRN Doug Sou, MD       Or  . LORazepam (ATIVAN) injection 1 mg  1 mg Intravenous Q6H PRN Doug Sou, MD      . LORazepam (ATIVAN) tablet 0-4 mg  0-4 mg Oral Q6H Doug Sou, MD       Followed by  . [START ON 11/20/2013] LORazepam (ATIVAN) tablet 0-4 mg  0-4 mg Oral Q12H Doug Sou, MD      . multivitamin with minerals tablet 1 tablet  1 tablet Oral Daily Doug Sou, MD      . piperacillin-tazobactam (ZOSYN) IVPB 3.375 g  3.375 g Intravenous Once Doug Sou, MD      . sodium chloride 0.9 % 1,000 mL with thiamine 100 mg, folic acid 1 mg, multivitamins adult 10 mL infusion    Intravenous Once Doree Albee, MD      . spironolactone (ALDACTONE) tablet 12.5 mg  12.5 mg Oral Daily Doree Albee, MD      . thiamine (VITAMIN B-1) tablet 100 mg  100 mg Oral Daily Doug Sou, MD       Or  . thiamine (B-1) injection 100 mg  100 mg Intravenous Daily Doug Sou, MD      . tiotropium [facility]) inhalation capsule 18 mcg  18 mcg Inhalation Daily Doree Albee, MD      . vancomycin (VANCOCIN) IVPB 1000 mg/200 mL premix  1,000 mg Intravenous Once Doug Sou, MD      . Vitamin D-3 CAPS 1,000 Units  1,000 Units Oral Daily Doree Albee, MD       Current Outpatient Prescriptions  Medication Sig Dispense Refill  . acetaminophen (TYLENOL) 500 MG tablet Take 1,500 mg by mouth every 6 (six) hours as needed for headache (headache).       Marland Kitchen aspirin EC 81 MG EC tablet Take 1 tablet (81 mg total) by mouth daily.  90 tablet  0  . atorvastatin (LIPITOR) 40 MG tablet Take 1 tablet (40 mg total) by mouth daily at 6 PM. Need appointment for future refills.  30 tablet  0  . B-Complex CAPS Take 1 capsule by mouth daily.      . carvedilol (COREG) 6.25 MG tablet Take 1 tablet (6.25 mg total) by mouth 2 (two) times daily with a meal. <please schedule appointment for future refills>  60 tablet  2  . Cholecalciferol (VITAMIN D-3) 1000 UNITS CAPS Take 1,000 Units by mouth daily.      . clonazePAM (KLONOPIN) 0.5 MG tablet Take 0.5 mg by mouth 2 (two) times daily as needed for anxiety (anxiety).       Marland Kitchen Ephedrine-Guaifenesin (PRIMATENE ASTHMA) 12.5-200 MG TABS Take 2 tablets by mouth 2 (two) times daily as needed (shortness of breath).       . fluticasone (FLONASE) 50 MCG/ACT nasal spray Use two sprays in each nostril daily  16 g  11  . furosemide (LASIX) 40 MG tablet Take 1 tablet (40 mg total) by mouth daily. Need appointment for future refills.  30 tablet  0  . gabapentin (NEURONTIN) 300 MG capsule Take 1 capsule (300 mg total)  by mouth 3 (three) times daily.  90 capsule  0  . ipratropium  (ATROVENT HFA) 17 MCG/ACT inhaler Inhale 2 puffs into the lungs 3 (three) times daily as needed for wheezing (wheezing).       . Ipratropium-Albuterol (COMBIVENT RESPIMAT) 20-100 MCG/ACT AERS respimat Inhale 2 puffs into the lungs every 6 (six) hours as needed for wheezing (wheezing).      Marland Kitchen lisinopril (PRINIVIL,ZESTRIL) 20 MG tablet Take 20 mg by mouth daily.      . magnesium oxide (MAG-OX) 400 MG tablet Take 800 mg by mouth daily.      . montelukast (SINGULAIR) 10 MG tablet Take 1 tablet (10 mg total) by mouth at bedtime.  30 tablet  3  . Multiple Vitamin (MULTIVITAMIN WITH MINERALS) TABS tablet Take 1 tablet by mouth daily.      . potassium chloride SA (K-DUR,KLOR-CON) 20 MEQ tablet Take 2 tablets (40 mEq total) by mouth daily.  60 tablet  11  . spironolactone (ALDACTONE) 25 MG tablet Take 0.5 tablets (12.5 mg total) by mouth daily. Need appointment for future refills.  15 tablet  0  . tiotropium (SPIRIVA) 18 MCG inhalation capsule Place 1 capsule (18 mcg total) into inhaler and inhale daily.  30 capsule  1  . triamcinolone (KENALOG) 0.025 % ointment Apply 1 application topically 2 (two) times daily.  30 g  0  . [DISCONTINUED] albuterol (PROVENTIL,VENTOLIN) 90 MCG/ACT inhaler Inhale 2 puffs into the lungs every 6 (six) hours as needed for wheezing.  17 g  0   Review Of Systems: 12 point ROS negative except as noted above in HPI.  Physical Exam: Filed Vitals:   11/18/13 1933  BP: 125/60  Pulse: 132  Temp:   Resp:     General: cooperative and tremulous HEENT: PERRLA, extra ocular movement intact and sclera clear, anicteric, mildly disshevled appearing  Heart: S1, S2 normal, no murmur, rub or gallop, regular rate and rhythm Lungs: clear to auscultation, no wheezes or rales and unlabored breathing Abdomen: + bowel sounds, + umbilical hernia, no significant TTP diffusely  Extremities: extremities normal, atraumatic, no cyanosis or edema, + psoriatic rash Skin:as above  Neurology: +  tremulousness and ? asterixis, otherwise grossly normal exam   Labs and Imaging: Lab Results  Component Value Date/Time   NA 129* 11/18/2013  4:26 PM   K 4.5 11/18/2013  4:26 PM   CL 90* 11/18/2013  4:26 PM   CO2 18* 11/18/2013  4:26 PM   BUN 8 11/18/2013  4:26 PM   CREATININE 0.90 11/18/2013  4:26 PM   CREATININE 1.06 10/26/2013 10:18 AM   GLUCOSE 137* 11/18/2013  4:26 PM   Lab Results  Component Value Date   WBC 8.2 11/18/2013   HGB 10.7* 11/18/2013   HCT 31.4* 11/18/2013   MCV 92.6 11/18/2013   PLT 317 11/18/2013    Ct Abdomen Pelvis W Contrast  11/18/2013   CLINICAL DATA:  Umbilical hernia, abdominal pain and tenderness. Diarrhea, no vomiting, symptoms began 1 week ago.  EXAM: CT ABDOMEN AND PELVIS WITH CONTRAST  TECHNIQUE: Multidetector CT imaging of the abdomen and pelvis was performed using the standard protocol following bolus administration of intravenous contrast.  CONTRAST:  50mL OMNIPAQUE IOHEXOL 300 MG/ML SOLN, OMNIPAQUE IOHEXOL 300 MG/ML SOLN  COMPARISON:  Abdominal ultrasound Jun 13, 2013 and CT of the abdomen and pelvis September 07, 2012.  FINDINGS: LUNG BASES: Included view of the lung bases are clear. Visualized heart and pericardium are unremarkable.  SOLID ORGANS: Mild hepatomegaly. The liver is diffusely hypodense most consistent with hepatic steatosis. Cholelithiasis without superimposed inflammatory changes. Spleen, adrenal glands and pancreas are normal.  GASTROINTESTINAL TRACT: The stomach, small and large bowel are normal in course and caliber without inflammatory changes. Mild ascending colonic wall thickening, and fatty intramural changes. Normal appendix.  KIDNEYS/ URINARY TRACT: Kidneys are orthotopic, demonstrating symmetric enhancement. Nonobstructing 3 mm LEFT lower pole nephrolithiasis. No hydronephrosis or solid renal masses. 21 mm exophytic LEFT upper pole cyst. The unopacified ureters are normal in course and caliber. Delayed imaging through the kidneys  demonstrates symmetric prompt contrast excretion within the proximal urinary collecting system. Urinary bladder is partially distended and unremarkable.  PERITONEUM/RETROPERITONEUM: No intraperitoneal free fluid nor free air. Aortoiliac vessels are normal in course and caliber, mild calcific atherosclerosis of. No lymphadenopathy by CT size criteria. Prostate is enlarged, 5.4 x 4.8 cm.  SOFT TISSUE/OSSEOUS STRUCTURES: Wide necked (4.9 cm ) fat containing ventral hernia is unchanged. Mild chronic L4 compression fracture, new. The moderate L1 burst fracture with approximately 50% height loss and 3-4 mm retropulsed bony fragments appears chronic though, new from prior imaging.  IMPRESSION: Ascending colonic wall thickening without superimposed inflammation, suggest chronic colitis without obstruction.  Large fat containing umbilical hernia, stable.  Chronic appearing lumbar spine fractures including moderate L1 burst fracture are new from prior imaging.  Mild hepatomegaly and steatosis. Cholelithiasis without CT findings of acute cholecystitis.   Electronically Signed   By: Awilda Metro   On: 11/18/2013 19:05   Dg Chest Portable 1 View  11/18/2013   CLINICAL DATA:  Cough, weakness  EXAM: PORTABLE CHEST - 1 VIEW  COMPARISON:  CT chest dated 06/14/2013  FINDINGS: Scattered patchy opacities in the right upper lobe, right perihilar region, lingula, and left lower lobe. When correlating with the prior CT, most of this appearance is chronic. Superimposed left lower lobe pneumonia is not entirely excluded. No pleural effusion or pneumothorax.  The heart is normal in size.  IMPRESSION: Scattered patchy opacities bilaterally, most of which is chronic.  Superimposed left lower lobe pneumonia is not entirely excluded.   Electronically Signed   By: Charline Bills M.D.   On: 11/18/2013 18:37           Doree Albee MD  Pager: [phone]

## 2013-11-18 NOTE — ED Notes (Signed)
Family at bedside. 

## 2013-11-18 NOTE — ED Notes (Signed)
Pt c/o gen abd pain and emotional distress.  C/o diarrhea, no vomiting.  Sx started x 1 wk ago.  States that he has been drinking and smoking too much.  Pt states he drinks a fifth per day.  Denies SI/HI.

## 2013-11-18 NOTE — ED Notes (Signed)
Pt knows he needs to give a urine sample

## 2013-11-19 ENCOUNTER — Other Ambulatory Visit: Payer: Self-pay

## 2013-11-19 DIAGNOSIS — F1010 Alcohol abuse, uncomplicated: Secondary | ICD-10-CM

## 2013-11-19 DIAGNOSIS — I5023 Acute on chronic systolic (congestive) heart failure: Secondary | ICD-10-CM

## 2013-11-19 DIAGNOSIS — I10 Essential (primary) hypertension: Secondary | ICD-10-CM

## 2013-11-19 LAB — COMPREHENSIVE METABOLIC PANEL
ALT: 23 U/L (ref 0–53)
AST: 50 U/L — ABNORMAL HIGH (ref 0–37)
Albumin: 2.8 g/dL — ABNORMAL LOW (ref 3.5–5.2)
Alkaline Phosphatase: 118 U/L — ABNORMAL HIGH (ref 39–117)
Anion gap: 14 (ref 5–15)
BUN: 5 mg/dL — ABNORMAL LOW (ref 6–23)
CO2: 22 meq/L (ref 19–32)
CREATININE: 0.91 mg/dL (ref 0.50–1.35)
Calcium: 7.9 mg/dL — ABNORMAL LOW (ref 8.4–10.5)
Chloride: 92 mEq/L — ABNORMAL LOW (ref 96–112)
GFR, EST NON AFRICAN AMERICAN: 90 mL/min — AB (ref >90)
GLUCOSE: 103 mg/dL — AB (ref 70–99)
Potassium: 3.9 mEq/L (ref 3.7–5.3)
SODIUM: 128 meq/L — AB (ref 137–147)
Total Bilirubin: 0.6 mg/dL (ref 0.3–1.2)
Total Protein: 6.3 g/dL (ref 6.0–8.3)

## 2013-11-19 LAB — CBC WITH DIFFERENTIAL/PLATELET
BASOS ABS: 0.1 10*3/uL (ref 0.0–0.1)
Basophils Relative: 1 % (ref 0–1)
EOS ABS: 0.1 10*3/uL (ref 0.0–0.7)
Eosinophils Relative: 1 % (ref 0–5)
HCT: 27.2 % — ABNORMAL LOW (ref 39.0–52.0)
Hemoglobin: 9.6 g/dL — ABNORMAL LOW (ref 13.0–17.0)
LYMPHS ABS: 0.8 10*3/uL (ref 0.7–4.0)
Lymphocytes Relative: 9 % — ABNORMAL LOW (ref 12–46)
MCH: 32.1 pg (ref 26.0–34.0)
MCHC: 35.3 g/dL (ref 30.0–36.0)
MCV: 91 fL (ref 78.0–100.0)
Monocytes Absolute: 1 10*3/uL (ref 0.1–1.0)
Monocytes Relative: 12 % (ref 3–12)
NEUTROS PCT: 77 % (ref 43–77)
Neutro Abs: 6.6 10*3/uL (ref 1.7–7.7)
PLATELETS: 272 10*3/uL (ref 150–400)
RBC: 2.99 MIL/uL — ABNORMAL LOW (ref 4.22–5.81)
RDW: 13.5 % (ref 11.5–15.5)
WBC: 8.5 10*3/uL (ref 4.0–10.5)

## 2013-11-19 LAB — HIV ANTIBODY (ROUTINE TESTING W REFLEX): HIV 1&2 Ab, 4th Generation: NONREACTIVE

## 2013-11-19 LAB — OSMOLALITY: Osmolality: 263 mOsm/kg — ABNORMAL LOW (ref 275–300)

## 2013-11-19 LAB — STREP PNEUMONIAE URINARY ANTIGEN: Strep Pneumo Urinary Antigen: NEGATIVE

## 2013-11-19 LAB — SODIUM, URINE, RANDOM: Sodium, Ur: 117 mEq/L

## 2013-11-19 LAB — MRSA PCR SCREENING: MRSA by PCR: POSITIVE — AB

## 2013-11-19 LAB — CLOSTRIDIUM DIFFICILE BY PCR: CDIFFPCR: NEGATIVE

## 2013-11-19 LAB — OSMOLALITY, URINE: Osmolality, Ur: 285 mOsm/kg — ABNORMAL LOW (ref 390–1090)

## 2013-11-19 LAB — LEGIONELLA ANTIGEN, URINE

## 2013-11-19 LAB — TROPONIN I: Troponin I: <0.3 ng/mL (ref <0.30)

## 2013-11-19 MED ORDER — SODIUM CHLORIDE 0.9 % IV SOLN: INTRAVENOUS | Status: DC

## 2013-11-19 MED ORDER — METOPROLOL TARTRATE 1 MG/ML IV SOLN
5.0000 mg | Freq: Once | INTRAVENOUS | Status: AC
  Administered 2013-11-19: 5 mg via INTRAVENOUS
  Filled 2013-11-19: qty 5

## 2013-11-19 MED ORDER — MUPIROCIN 2 % EX OINT
1.0000 | TOPICAL_OINTMENT | Freq: Two times a day (BID) | CUTANEOUS | Status: DC
Start: 2013-11-19 — End: 2013-11-19
  Administered 2013-11-19 (×2): 1 via NASAL
  Filled 2013-11-19: qty 22

## 2013-11-19 MED ORDER — METRONIDAZOLE 500 MG PO TABS: 500.0000 mg | ORAL_TABLET | Freq: Three times a day (TID) | ORAL | Status: DC

## 2013-11-19 MED ORDER — CIPROFLOXACIN HCL 500 MG PO TABS: 500.0000 mg | ORAL_TABLET | Freq: Two times a day (BID) | ORAL | Status: DC

## 2013-11-19 MED ORDER — CHLORHEXIDINE GLUCONATE CLOTH 2 % EX PADS
6.0000 | MEDICATED_PAD | Freq: Every day | CUTANEOUS | Status: DC
  Administered 2013-11-19: 6 via TOPICAL

## 2013-11-19 NOTE — Progress Notes (Signed)
Patient received in a stable condition, vital signs stable, not in respiratory distress on room air. Sent down via wheelchair with CNA, for discharge home.

## 2013-11-19 NOTE — Progress Notes (Signed)
CSW faxed list of SA resources to pt's RN.  Per RN, pt's family requesting this information.

## 2013-11-19 NOTE — Discharge Summary (Signed)
Physician Discharge Summary  Magda BernheimJames K Loeper ZOX:[id number]:[id number] DOB: [birth year] DOA: 11/18/2013  PCP: Renold DonWALDEN,JEFF, MD  Admit date: 11/18/2013 Discharge date: 11/19/2013  Time spent: 35 minutes  Recommendations for Outpatient Follow-up:  1. Follow up with PCP in 1-2 week  Discharge Diagnoses:  Active Problems:   Colitis   Abdominal pain   CAP (community acquired pneumonia)   ETOH abuse   Discharge Condition: Improved  Diet recommendation: Regular  Filed Weights   11/18/13 2239 11/19/13 0500  Weight: 85.2 kg (187 lb 13.3 oz) 85.3 kg (188 lb 0.8 oz)    History of present illness:  See admit H and P from 10/15. Briefly, pt presets with abd pain in setting of ETOH abuse and CT abd with findings of colitis  Hospital Course:  Abd pain with colitis - Resolved with flagyl and levaquin - Successfully advanced diet to regular diet without difficulty - Will discharge home with PO cipro and flagyl  ?CAP - No evidence of obvious pneumonia - Monitor for now  ETOH abuse - Recommend Alcoholic program on discharge - On CIWA while inpt - No signs of withdrawals  Umbilical hernia - Stabe  Consultations:  none  Discharge Exam: Filed Vitals:   11/19/13 0500 11/19/13 0752 11/19/13 0800 11/19/13 1349  BP: 130/77   143/81  Pulse: 115  110 116  Temp: 98.5 F (36.9 C)   98.4 F (36.9 C)  TempSrc: Oral   Oral  Resp: 20   18  Height:      Weight: 85.3 kg (188 lb 0.8 oz)     SpO2: 100% 100%  95%    General: awake, in nad Cardiovascular: regular, s1, s2 Respiratory: normal resp effort  Discharge Instructions     Medication List         acetaminophen 500 MG tablet  Commonly known as:  TYLENOL  Take 1,500 mg by mouth every 6 (six) hours as needed for headache (headache).     aspirin 81 MG EC tablet  Take 1 tablet (81 mg total) by mouth daily.     atorvastatin 40 MG tablet  Commonly known as:  LIPITOR  Take 1 tablet (40 mg total) by mouth daily at 6 PM. Need  appointment for future refills.     B-Complex Caps  Take 1 capsule by mouth daily.     carvedilol 6.25 MG tablet  Commonly known as:  COREG  Take 1 tablet (6.25 mg total) by mouth 2 (two) times daily with a meal. <please schedule appointment for future refills>     ciprofloxacin 500 MG tablet  Commonly known as:  CIPRO  Take 1 tablet (500 mg total) by mouth 2 (two) times daily.     clonazePAM 0.5 MG tablet  Commonly known as:  KLONOPIN  Take 0.5 mg by mouth 2 (two) times daily as needed for anxiety (anxiety).     COMBIVENT RESPIMAT 20-100 MCG/ACT Aers respimat  Generic drug:  Ipratropium-Albuterol  Inhale 2 puffs into the lungs every 6 (six) hours as needed for wheezing (wheezing).     fluticasone 50 MCG/ACT nasal spray  Commonly known as:  FLONASE  Use two sprays in each nostril daily     furosemide 40 MG tablet  Commonly known as:  LASIX  Take 1 tablet (40 mg total) by mouth daily. Need appointment for future refills.     gabapentin 300 MG capsule  Commonly known as:  NEURONTIN  Take 1 capsule (300 mg total) by mouth 3 (three) times daily.  ipratropium 17 MCG/ACT inhaler  Commonly known as:  ATROVENT HFA  Inhale 2 puffs into the lungs 3 (three) times daily as needed for wheezing (wheezing).     lisinopril 20 MG tablet  Commonly known as:  PRINIVIL,ZESTRIL  Take 20 mg by mouth daily.     magnesium oxide 400 MG tablet  Commonly known as:  MAG-OX  Take 800 mg by mouth daily.     metroNIDAZOLE 500 MG tablet  Commonly known as:  FLAGYL  Take 1 tablet (500 mg total) by mouth 3 (three) times daily.     montelukast 10 MG tablet  Commonly known as:  SINGULAIR  Take 1 tablet (10 mg total) by mouth at bedtime.     multivitamin with minerals Tabs tablet  Take 1 tablet by mouth daily.     potassium chloride SA 20 MEQ tablet  Commonly known as:  K-DUR,KLOR-CON  Take 2 tablets (40 mEq total) by mouth daily.     PRIMATENE ASTHMA 12.5-200 MG Tabs  Generic drug:   Ephedrine-Guaifenesin  Take 2 tablets by mouth 2 (two) times daily as needed (shortness of breath).     spironolactone 25 MG tablet  Commonly known as:  ALDACTONE  Take 0.5 tablets (12.5 mg total) by mouth daily. Need appointment for future refills.     tiotropium 18 MCG inhalation capsule  Commonly known as:  SPIRIVA  Place 1 capsule (18 mcg total) into inhaler and inhale daily.     triamcinolone 0.025 % ointment  Commonly known as:  KENALOG  Apply 1 application topically 2 (two) times daily.     Vitamin D-3 1000 UNITS Caps  Take 1,000 Units by mouth daily.       No Known Allergies Follow-up Information   Follow up with WALDEN,JEFF, MD. Schedule an appointment as soon as possible for a visit in 1 week.   Specialty:  Family Medicine   Contact information:   [address] [place] [address] [phone]        The results of significant diagnostics from this hospitalization (including imaging, microbiology, ancillary and laboratory) are listed below for reference.    Significant Diagnostic Studies: Ct Abdomen Pelvis W Contrast  11/18/2013   CLINICAL DATA:  Umbilical hernia, abdominal pain and tenderness. Diarrhea, no vomiting, symptoms began 1 week ago.  EXAM: CT ABDOMEN AND PELVIS WITH CONTRAST  TECHNIQUE: Multidetector CT imaging of the abdomen and pelvis was performed using the standard protocol following bolus administration of intravenous contrast.  CONTRAST:  74mL OMNIPAQUE IOHEXOL 300 MG/ML SOLN, OMNIPAQUE IOHEXOL 300 MG/ML SOLN  COMPARISON:  Abdominal ultrasound Jun 13, 2013 and CT of the abdomen and pelvis September 07, 2012.  FINDINGS: LUNG BASES: Included view of the lung bases are clear. Visualized heart and pericardium are unremarkable.  SOLID ORGANS: Mild hepatomegaly. The liver is diffusely hypodense most consistent with hepatic steatosis. Cholelithiasis without superimposed inflammatory changes. Spleen, adrenal glands and pancreas are normal.   GASTROINTESTINAL TRACT: The stomach, small and large bowel are normal in course and caliber without inflammatory changes. Mild ascending colonic wall thickening, and fatty intramural changes. Normal appendix.  KIDNEYS/ URINARY TRACT: Kidneys are orthotopic, demonstrating symmetric enhancement. Nonobstructing 3 mm LEFT lower pole nephrolithiasis. No hydronephrosis or solid renal masses. 21 mm exophytic LEFT upper pole cyst. The unopacified ureters are normal in course and caliber. Delayed imaging through the kidneys demonstrates symmetric prompt contrast excretion within the proximal urinary collecting system. Urinary bladder is partially distended and unremarkable.  PERITONEUM/RETROPERITONEUM:  No intraperitoneal free fluid nor free air. Aortoiliac vessels are normal in course and caliber, mild calcific atherosclerosis of. No lymphadenopathy by CT size criteria. Prostate is enlarged, 5.4 x 4.8 cm.  SOFT TISSUE/OSSEOUS STRUCTURES: Wide necked (4.9 cm ) fat containing ventral hernia is unchanged. Mild chronic L4 compression fracture, new. The moderate L1 burst fracture with approximately 50% height loss and 3-4 mm retropulsed bony fragments appears chronic though, new from prior imaging.  IMPRESSION: Ascending colonic wall thickening without superimposed inflammation, suggest chronic colitis without obstruction.  Large fat containing umbilical hernia, stable.  Chronic appearing lumbar spine fractures including moderate L1 burst fracture are new from prior imaging.  Mild hepatomegaly and steatosis. Cholelithiasis without CT findings of acute cholecystitis.   Electronically Signed   By: Awilda Metro   On: 11/18/2013 19:05   Dg Chest Portable 1 View  11/18/2013   CLINICAL DATA:  Cough, weakness  EXAM: PORTABLE CHEST - 1 VIEW  COMPARISON:  CT chest dated 06/14/2013  FINDINGS: Scattered patchy opacities in the right upper lobe, right perihilar region, lingula, and left lower lobe. When correlating with the prior  CT, most of this appearance is chronic. Superimposed left lower lobe pneumonia is not entirely excluded. No pleural effusion or pneumothorax.  The heart is normal in size.  IMPRESSION: Scattered patchy opacities bilaterally, most of which is chronic.  Superimposed left lower lobe pneumonia is not entirely excluded.   Electronically Signed   By: Charline Bills M.D.   On: 11/18/2013 18:37    Microbiology: Recent Results (from the past 240 hour(s))  MRSA PCR SCREENING     Status: Abnormal   Collection Time    11/18/13 11:23 PM      Result Value Ref Range Status   MRSA by PCR POSITIVE (*) NEGATIVE Final   Comment:            The GeneXpert MRSA Assay (FDA     approved for NASAL specimens     only), is one component of a     comprehensive MRSA colonization     surveillance program. It is not     intended to diagnose MRSA     infection nor to guide or     monitor treatment for     MRSA infections.     RESULT CALLED TO, READ BACK BY AND VERIFIED WITH:     RIMANDO,J/4W @0050  ON 11/19/13 BY KARCZEWSKI,S.  CLOSTRIDIUM DIFFICILE BY PCR     Status: None   Collection Time    11/19/13 11:46 AM      Result Value Ref Range Status   C difficile by pcr NEGATIVE  NEGATIVE Final   Comment: Performed at [facility]     Labs: Basic Metabolic Panel:  Recent Labs Lab 11/18/13 1626 11/19/13 0355  NA 129* 128*  K 4.5 3.9  CL 90* 92*  CO2 18* 22  GLUCOSE 137* 103*  BUN 8 5*  CREATININE 0.90 0.91  CALCIUM 8.7 7.9*   Liver Function Tests:  Recent Labs Lab 11/18/13 1626 11/19/13 0355  AST 52* 50*  ALT 29 23  ALKPHOS 129* 118*  BILITOT 0.2* 0.6  PROT 7.5 6.3  ALBUMIN 3.2* 2.8*    Recent Labs Lab 11/18/13 2123  LIPASE 21    Recent Labs Lab 11/18/13 2132  AMMONIA 35   CBC:  Recent Labs Lab 11/18/13 1626 11/19/13 0355  WBC 8.2 8.5  NEUTROABS 5.8 6.6  HGB 10.7* 9.6*  HCT 31.4* 27.2*  MCV  92.6 91.0  PLT 317 272   Cardiac Enzymes:  Recent Labs Lab  11/19/13 0745  TROPONINI <0.30   BNP: BNP (last 3 results) No results found for this basename: PROBNP,  in the last 8760 hours CBG: No results found for this basename: GLUCAP,  in the last 168 hours   Signed:  Leevi Cullars K  [facility] 11/19/2013, 5:54 PM

## 2013-11-19 NOTE — Care Management Note (Signed)
    Page 1 of 1   11/19/2013     4:26:28 PM CARE MANAGEMENT NOTE 11/19/2013  Patient:  Juan [name], Juan [name]   Account Number:  [id number]  Date Initiated:  11/19/2013  Documentation initiated by:  Lanier Clam  Subjective/Objective Assessment:   63 Y/O M ADMITTED W/COLITIS.ETOH.     Action/Plan:   FROM HOME.   Anticipated DC Date:  11/22/2013   Anticipated DC Plan:  HOME/SELF CARE      DC Planning Services  CM consult      Choice offered to / List presented to:             Status of service:  In process, will continue to follow Medicare Important Message given?   (If response is "NO", the following Medicare IM given date fields will be blank) Date Medicare IM given:   Medicare IM given by:   Date Additional Medicare IM given:   Additional Medicare IM given by:    Discharge Disposition:    Per UR Regulation:  Reviewed for med. necessity/level of care/duration of stay  If discussed at Long Length of Stay Meetings, dates discussed:    Comments:  11/19/13 Juan Link RN,BSN NCM [phone] MONITOR PROGRESS.

## 2013-11-20 LAB — FECAL LACTOFERRIN, QUANT: Fecal Lactoferrin: POSITIVE

## 2013-11-22 LAB — OVA AND PARASITE EXAMINATION

## 2013-11-23 LAB — STOOL CULTURE

## 2013-11-25 LAB — CULTURE, BLOOD (ROUTINE X 2)
CULTURE: NO GROWTH
Culture: NO GROWTH

## 2013-11-28 ENCOUNTER — Inpatient Hospital Stay (HOSPITAL_COMMUNITY)
Admission: EM | Admit: 2013-11-28 | Discharge: 2013-12-01 | DRG: 392 | Disposition: A | Discharge status: Home / Self Care | Payer: No Typology Code available for payment source | Attending: Family Medicine | Admitting: Family Medicine

## 2013-11-28 ENCOUNTER — Encounter (HOSPITAL_COMMUNITY): Payer: Self-pay | Admitting: Emergency Medicine

## 2013-11-28 DIAGNOSIS — F329 Major depressive disorder, single episode, unspecified: Secondary | ICD-10-CM | POA: Diagnosis present

## 2013-11-28 DIAGNOSIS — F10231 Alcohol dependence with withdrawal delirium: Secondary | ICD-10-CM | POA: Diagnosis present

## 2013-11-28 DIAGNOSIS — E872 Acidosis: Secondary | ICD-10-CM | POA: Diagnosis present

## 2013-11-28 DIAGNOSIS — Z8249 Family history of ischemic heart disease and other diseases of the circulatory system: Secondary | ICD-10-CM | POA: Diagnosis not present

## 2013-11-28 DIAGNOSIS — R569 Unspecified convulsions: Secondary | ICD-10-CM | POA: Diagnosis present

## 2013-11-28 DIAGNOSIS — L409 Psoriasis, unspecified: Secondary | ICD-10-CM | POA: Diagnosis present

## 2013-11-28 DIAGNOSIS — E871 Hypo-osmolality and hyponatremia: Secondary | ICD-10-CM | POA: Diagnosis present

## 2013-11-28 DIAGNOSIS — Z6829 Body mass index (BMI) 29.0-29.9, adult: Secondary | ICD-10-CM | POA: Diagnosis not present

## 2013-11-28 DIAGNOSIS — F419 Anxiety disorder, unspecified: Secondary | ICD-10-CM | POA: Diagnosis present

## 2013-11-28 DIAGNOSIS — J45909 Unspecified asthma, uncomplicated: Secondary | ICD-10-CM | POA: Diagnosis present

## 2013-11-28 DIAGNOSIS — I4510 Unspecified right bundle-branch block: Secondary | ICD-10-CM | POA: Diagnosis present

## 2013-11-28 DIAGNOSIS — J449 Chronic obstructive pulmonary disease, unspecified: Secondary | ICD-10-CM | POA: Diagnosis present

## 2013-11-28 DIAGNOSIS — I5042 Chronic combined systolic (congestive) and diastolic (congestive) heart failure: Secondary | ICD-10-CM | POA: Diagnosis present

## 2013-11-28 DIAGNOSIS — I10 Essential (primary) hypertension: Secondary | ICD-10-CM | POA: Diagnosis present

## 2013-11-28 DIAGNOSIS — F17210 Nicotine dependence, cigarettes, uncomplicated: Secondary | ICD-10-CM | POA: Diagnosis present

## 2013-11-28 DIAGNOSIS — E43 Unspecified severe protein-calorie malnutrition: Secondary | ICD-10-CM | POA: Insufficient documentation

## 2013-11-28 DIAGNOSIS — R000 Tachycardia, unspecified: Secondary | ICD-10-CM

## 2013-11-28 DIAGNOSIS — F1029 Alcohol dependence with unspecified alcohol-induced disorder: Secondary | ICD-10-CM

## 2013-11-28 DIAGNOSIS — Z7982 Long term (current) use of aspirin: Secondary | ICD-10-CM

## 2013-11-28 DIAGNOSIS — R0602 Shortness of breath: Secondary | ICD-10-CM

## 2013-11-28 DIAGNOSIS — E876 Hypokalemia: Secondary | ICD-10-CM | POA: Diagnosis present

## 2013-11-28 DIAGNOSIS — F1010 Alcohol abuse, uncomplicated: Secondary | ICD-10-CM

## 2013-11-28 DIAGNOSIS — F32A Depression, unspecified: Secondary | ICD-10-CM

## 2013-11-28 DIAGNOSIS — K429 Umbilical hernia without obstruction or gangrene: Secondary | ICD-10-CM | POA: Diagnosis present

## 2013-11-28 DIAGNOSIS — K529 Noninfective gastroenteritis and colitis, unspecified: Principal | ICD-10-CM | POA: Diagnosis present

## 2013-11-28 DIAGNOSIS — D649 Anemia, unspecified: Secondary | ICD-10-CM | POA: Diagnosis present

## 2013-11-28 DIAGNOSIS — R630 Anorexia: Secondary | ICD-10-CM | POA: Diagnosis present

## 2013-11-28 DIAGNOSIS — Z79899 Other long term (current) drug therapy: Secondary | ICD-10-CM

## 2013-11-28 DIAGNOSIS — R197 Diarrhea, unspecified: Secondary | ICD-10-CM | POA: Diagnosis present

## 2013-11-28 DIAGNOSIS — E860 Dehydration: Secondary | ICD-10-CM | POA: Diagnosis present

## 2013-11-28 DIAGNOSIS — F331 Major depressive disorder, recurrent, moderate: Secondary | ICD-10-CM

## 2013-11-28 LAB — I-STAT CG4 LACTIC ACID, ED: LACTIC ACID, VENOUS: 4.34 mmol/L — AB (ref 0.5–2.2)

## 2013-11-28 LAB — CBC WITH DIFFERENTIAL/PLATELET
BASOS PCT: 1 % (ref 0–1)
Basophils Absolute: 0.1 10*3/uL (ref 0.0–0.1)
EOS PCT: 1 % (ref 0–5)
Eosinophils Absolute: 0.1 10*3/uL (ref 0.0–0.7)
HEMATOCRIT: 35.8 % — AB (ref 39.0–52.0)
Hemoglobin: 12.5 g/dL — ABNORMAL LOW (ref 13.0–17.0)
Lymphocytes Relative: 24 % (ref 12–46)
Lymphs Abs: 1.1 10*3/uL (ref 0.7–4.0)
MCH: 31.6 pg (ref 26.0–34.0)
MCHC: 34.9 g/dL (ref 30.0–36.0)
MCV: 90.4 fL (ref 78.0–100.0)
MONO ABS: 0.8 10*3/uL (ref 0.1–1.0)
Monocytes Relative: 18 % — ABNORMAL HIGH (ref 3–12)
NEUTROS ABS: 2.5 10*3/uL (ref 1.7–7.7)
Neutrophils Relative %: 56 % (ref 43–77)
Platelets: 284 10*3/uL (ref 150–400)
RBC: 3.96 MIL/uL — ABNORMAL LOW (ref 4.22–5.81)
RDW: 14.1 % (ref 11.5–15.5)
WBC: 4.4 10*3/uL (ref 4.0–10.5)

## 2013-11-28 LAB — COMPREHENSIVE METABOLIC PANEL
ALBUMIN: 3.5 g/dL (ref 3.5–5.2)
ALT: 27 U/L (ref 0–53)
AST: 55 U/L — AB (ref 0–37)
Alkaline Phosphatase: 124 U/L — ABNORMAL HIGH (ref 39–117)
Anion gap: 19 — ABNORMAL HIGH (ref 5–15)
BUN: 12 mg/dL (ref 6–23)
CALCIUM: 8.7 mg/dL (ref 8.4–10.5)
CO2: 24 mEq/L (ref 19–32)
Chloride: 90 mEq/L — ABNORMAL LOW (ref 96–112)
Creatinine, Ser: 0.83 mg/dL (ref 0.50–1.35)
GFR calc Af Amer: >90 mL/min (ref >90)
GFR calc non Af Amer: >90 mL/min (ref >90)
Glucose, Bld: 144 mg/dL — ABNORMAL HIGH (ref 70–99)
Potassium: 4.1 mEq/L (ref 3.7–5.3)
SODIUM: 133 meq/L — AB (ref 137–147)
Total Bilirubin: 0.3 mg/dL (ref 0.3–1.2)
Total Protein: 7.7 g/dL (ref 6.0–8.3)

## 2013-11-28 LAB — LIPASE, BLOOD: LIPASE: 25 U/L (ref 11–59)

## 2013-11-28 MED ORDER — ONDANSETRON HCL 4 MG/2ML IJ SOLN
4.0000 mg | Freq: Four times a day (QID) | INTRAMUSCULAR | Status: DC | PRN
  Administered 2013-11-29: 4 mg via INTRAVENOUS
  Filled 2013-11-28: qty 2

## 2013-11-28 MED ORDER — LORAZEPAM 2 MG/ML IJ SOLN
2.0000 mg | Freq: Once | INTRAMUSCULAR | Status: AC
  Administered 2013-11-28: 2 mg via INTRAVENOUS
  Filled 2013-11-28: qty 1

## 2013-11-28 MED ORDER — CARVEDILOL 6.25 MG PO TABS
6.2500 mg | ORAL_TABLET | Freq: Two times a day (BID) | ORAL | Status: DC
  Administered 2013-11-29 – 2013-12-01 (×6): 6.25 mg via ORAL
  Filled 2013-11-28 (×8): qty 1

## 2013-11-28 MED ORDER — HEPARIN SODIUM (PORCINE) 5000 UNIT/ML IJ SOLN
5000.0000 [IU] | Freq: Three times a day (TID) | INTRAMUSCULAR | Status: DC
  Administered 2013-11-29 – 2013-12-01 (×9): 5000 [IU] via SUBCUTANEOUS
  Filled 2013-11-28 (×9): qty 1

## 2013-11-28 MED ORDER — LISINOPRIL 20 MG PO TABS
20.0000 mg | ORAL_TABLET | Freq: Every day | ORAL | Status: DC
  Administered 2013-11-29 – 2013-12-01 (×3): 20 mg via ORAL
  Filled 2013-11-28 (×3): qty 1

## 2013-11-28 MED ORDER — LORAZEPAM 0.5 MG PO TABS: 1.0000 mg | ORAL_TABLET | Freq: Four times a day (QID) | ORAL | Status: DC | PRN

## 2013-11-28 MED ORDER — ONDANSETRON HCL 4 MG PO TABS: 4.0000 mg | ORAL_TABLET | Freq: Four times a day (QID) | ORAL | Status: DC | PRN

## 2013-11-28 MED ORDER — ASPIRIN EC 81 MG PO TBEC
81.0000 mg | DELAYED_RELEASE_TABLET | Freq: Every day | ORAL | Status: DC
  Administered 2013-11-29 – 2013-12-01 (×3): 81 mg via ORAL
  Filled 2013-11-28 (×3): qty 1

## 2013-11-28 MED ORDER — LORAZEPAM 2 MG/ML IJ SOLN
0.0000 mg | Freq: Four times a day (QID) | INTRAMUSCULAR | Status: DC
  Administered 2013-11-29 (×2): 2 mg via INTRAVENOUS
  Filled 2013-11-28 (×2): qty 1

## 2013-11-28 MED ORDER — MONTELUKAST SODIUM 10 MG PO TABS
10.0000 mg | ORAL_TABLET | Freq: Every day | ORAL | Status: DC
  Administered 2013-11-29 – 2013-11-30 (×3): 10 mg via ORAL
  Filled 2013-11-28 (×4): qty 1

## 2013-11-28 MED ORDER — ALBUTEROL SULFATE (2.5 MG/3ML) 0.083% IN NEBU
5.0000 mg | INHALATION_SOLUTION | Freq: Four times a day (QID) | RESPIRATORY_TRACT | Status: DC | PRN
  Administered 2013-11-29 (×2): 2.5 mg via RESPIRATORY_TRACT
  Administered 2013-11-29 – 2013-12-01 (×3): 5 mg via RESPIRATORY_TRACT
  Filled 2013-11-28 (×6): qty 6

## 2013-11-28 MED ORDER — GABAPENTIN 300 MG PO CAPS
300.0000 mg | ORAL_CAPSULE | Freq: Three times a day (TID) | ORAL | Status: DC
  Administered 2013-11-29 – 2013-12-01 (×8): 300 mg via ORAL
  Filled 2013-11-28 (×10): qty 1

## 2013-11-28 MED ORDER — LORAZEPAM 2 MG/ML IJ SOLN: 0.0000 mg | Freq: Two times a day (BID) | INTRAMUSCULAR | Status: DC

## 2013-11-28 MED ORDER — THIAMINE HCL 100 MG/ML IJ SOLN
100.0000 mg | Freq: Every day | INTRAMUSCULAR | Status: DC
  Filled 2013-11-28: qty 1

## 2013-11-28 MED ORDER — ACETAMINOPHEN 650 MG RE SUPP: 650.0000 mg | Freq: Four times a day (QID) | RECTAL | Status: DC | PRN

## 2013-11-28 MED ORDER — ALBUTEROL SULFATE HFA 108 (90 BASE) MCG/ACT IN AERS
2.0000 | INHALATION_SPRAY | Freq: Once | RESPIRATORY_TRACT | Status: AC
  Administered 2013-11-28: 2 via RESPIRATORY_TRACT
  Filled 2013-11-28: qty 6.7

## 2013-11-28 MED ORDER — SODIUM CHLORIDE 0.9 % IV SOLN
INTRAVENOUS | Status: DC
  Administered 2013-11-28 – 2013-11-29 (×2): via INTRAVENOUS
  Administered 2013-11-29: 100 mL/h via INTRAVENOUS

## 2013-11-28 MED ORDER — ATORVASTATIN CALCIUM 40 MG PO TABS
40.0000 mg | ORAL_TABLET | Freq: Every day | ORAL | Status: DC
  Administered 2013-11-29 – 2013-12-01 (×3): 40 mg via ORAL
  Filled 2013-11-28 (×3): qty 1

## 2013-11-28 MED ORDER — TIOTROPIUM BROMIDE MONOHYDRATE 18 MCG IN CAPS
18.0000 ug | ORAL_CAPSULE | Freq: Every day | RESPIRATORY_TRACT | Status: DC
  Administered 2013-11-30 – 2013-12-01 (×2): 18 ug via RESPIRATORY_TRACT
  Filled 2013-11-28 (×3): qty 5

## 2013-11-28 MED ORDER — FOLIC ACID 1 MG PO TABS
1.0000 mg | ORAL_TABLET | Freq: Every day | ORAL | Status: DC
  Administered 2013-11-29: 1 mg via ORAL
  Filled 2013-11-28: qty 1

## 2013-11-28 MED ORDER — FLUOCINONIDE 0.05 % EX OINT
TOPICAL_OINTMENT | Freq: Two times a day (BID) | CUTANEOUS | Status: DC
  Administered 2013-11-29: via TOPICAL
  Administered 2013-11-29: 1 via TOPICAL
  Administered 2013-11-29 – 2013-12-01 (×3): via TOPICAL
  Filled 2013-11-28 (×2): qty 15

## 2013-11-28 MED ORDER — ACETAMINOPHEN 325 MG PO TABS: 650.0000 mg | ORAL_TABLET | Freq: Four times a day (QID) | ORAL | Status: DC | PRN

## 2013-11-28 MED ORDER — LORAZEPAM 2 MG/ML IJ SOLN
1.0000 mg | Freq: Four times a day (QID) | INTRAMUSCULAR | Status: DC | PRN
  Administered 2013-11-29: 1 mg via INTRAVENOUS
  Filled 2013-11-28: qty 1

## 2013-11-28 MED ORDER — ADULT MULTIVITAMIN W/MINERALS CH
1.0000 | ORAL_TABLET | Freq: Every day | ORAL | Status: DC
  Administered 2013-11-29: 1 via ORAL
  Filled 2013-11-28: qty 1

## 2013-11-28 MED ORDER — SPIRONOLACTONE 12.5 MG HALF TABLET
12.5000 mg | ORAL_TABLET | Freq: Every day | ORAL | Status: DC
  Administered 2013-11-29 – 2013-12-01 (×3): 12.5 mg via ORAL
  Filled 2013-11-28 (×3): qty 1

## 2013-11-28 MED ORDER — SODIUM CHLORIDE 0.9 % IV BOLUS (SEPSIS)
1000.0000 mL | Freq: Once | INTRAVENOUS | Status: AC
  Administered 2013-11-28: 1000 mL via INTRAVENOUS

## 2013-11-28 MED ORDER — VITAMIN B-1 100 MG PO TABS
100.0000 mg | ORAL_TABLET | Freq: Every day | ORAL | Status: DC
  Administered 2013-11-29: 100 mg via ORAL
  Filled 2013-11-28: qty 1

## 2013-11-28 NOTE — ED Notes (Signed)
Lactic Acid 4.34 mmol/L  Abnormal lab given to Dr. Gwendolyn Grant

## 2013-11-28 NOTE — ED Notes (Signed)
Admitting MD in to assess pt for admission at this time 

## 2013-11-28 NOTE — ED Provider Notes (Signed)
CSN: [id number]     Arrival date & time 11/28/13  1437 History   First MD Initiated Contact with Patient 11/28/13 1630     Chief Complaint  Patient presents with  . Diarrhea  . Weakness     (Consider location/radiation/quality/duration/timing/severity/associated sxs/prior Treatment) Patient is a 63 y.o. male presenting with diarrhea and weakness. The history is provided by the patient.  Diarrhea Quality:  Watery Severity:  Moderate Onset quality:  Gradual Duration:  3 weeks Timing:  Constant Progression:  Worsening Relieved by:  Nothing Worsened by:  Nothing tried Associated symptoms: no abdominal pain, no fever and no vomiting   Risk factors: recent antibiotic use (recently on cipro/flagyl for colitis)   Risk factors: no suspicious food intake and no travel to endemic areas   Weakness Pertinent negatives include no abdominal pain and no shortness of breath.    Past Medical History  Diagnosis Date  . CHF (congestive heart failure) 10/2010    ECHO:  EF 40%, Grade II diastolic dysfunction  . Alcoholism   . Allergy     seasonal   . Anxiety   . Depression   . Asthma   . Hyperlipidemia   . Hypertension   . Seizures    Past Surgical History  Procedure Laterality Date  . Removal of ingrown toenail     Family History  Problem Relation Age of Onset  . Alzheimer's disease Mother   . Diabetes Mother   . Heart disease Maternal Uncle    History  Substance Use Topics  . Smoking status: Current Every Day Smoker -- 1.00 packs/day for 40 years    Types: Cigarettes  . Smokeless tobacco: Never Used     Comment: currently smokes less than 1 ppd  . Alcohol Use: Yes     Comment: 4-5 burbon's a day for the past 15 years    Review of Systems  Constitutional: Negative for fever.  Respiratory: Negative for cough and shortness of breath.   Gastrointestinal: Positive for diarrhea. Negative for vomiting and abdominal pain.  Neurological: Positive for weakness.  All other systems  reviewed and are negative.     Allergies  Review of patient's allergies indicates no known allergies.  Home Medications   Prior to Admission medications   Medication Sig Start Date End Date Taking? Authorizing Provider  acetaminophen (TYLENOL) 500 MG tablet Take 1,500 mg by mouth every 6 (six) hours as needed for headache (headache).     Historical Provider, MD  aspirin EC 81 MG EC tablet Take 1 tablet (81 mg total) by mouth daily. 11/13/12   Renae FickleMackenzie Short, MD  atorvastatin (LIPITOR) 40 MG tablet Take 1 tablet (40 mg total) by mouth daily at 6 PM. Need appointment for future refills. 11/02/13   Chrystie NoseKenneth C. Hilty, MD  B-Complex CAPS Take 1 capsule by mouth daily.    Historical Provider, MD  carvedilol (COREG) 6.25 MG tablet Take 1 tablet (6.25 mg total) by mouth 2 (two) times daily with a meal. <please schedule appointment for future refills> 09/21/13   Chrystie NoseKenneth C. Hilty, MD  Cholecalciferol (VITAMIN D-3) 1000 UNITS CAPS Take 1,000 Units by mouth daily.    Historical Provider, MD  ciprofloxacin (CIPRO) 500 MG tablet Take 1 tablet (500 mg total) by mouth 2 (two) times daily. 11/19/13   Jerald KiefStephen K Chiu, MD  clonazePAM (KLONOPIN) 0.5 MG tablet Take 0.5 mg by mouth 2 (two) times daily as needed for anxiety (anxiety).     Historical Provider, MD  Ephedrine-Guaifenesin (PRIMATENE  ASTHMA) 12.5-200 MG TABS Take 2 tablets by mouth 2 (two) times daily as needed (shortness of breath).     Historical Provider, MD  fluticasone Aleda Grana) 50 MCG/ACT nasal spray Use two sprays in each nostril daily 11/08/13   Tobey Grim, MD  furosemide (LASIX) 40 MG tablet Take 1 tablet (40 mg total) by mouth daily. Need appointment for future refills. 11/02/13   Chrystie Nose, MD  gabapentin (NEURONTIN) 300 MG capsule Take 1 capsule (300 mg total) by mouth 3 (three) times daily. 06/17/13   Kathlen Mody, MD  ipratropium (ATROVENT HFA) 17 MCG/ACT inhaler Inhale 2 puffs into the lungs 3 (three) times daily as needed for  wheezing (wheezing).     Historical Provider, MD  Ipratropium-Albuterol (COMBIVENT RESPIMAT) 20-100 MCG/ACT AERS respimat Inhale 2 puffs into the lungs every 6 (six) hours as needed for wheezing (wheezing).    Historical Provider, MD  lisinopril (PRINIVIL,ZESTRIL) 20 MG tablet Take 20 mg by mouth daily.    Historical Provider, MD  magnesium oxide (MAG-OX) 400 MG tablet Take 800 mg by mouth daily.    Historical Provider, MD  metroNIDAZOLE (FLAGYL) 500 MG tablet Take 1 tablet (500 mg total) by mouth 3 (three) times daily. 11/19/13   Jerald Kief, MD  montelukast (SINGULAIR) 10 MG tablet Take 1 tablet (10 mg total) by mouth at bedtime. 10/26/13   Tobey Grim, MD  Multiple Vitamin (MULTIVITAMIN WITH MINERALS) TABS tablet Take 1 tablet by mouth daily.    Historical Provider, MD  potassium chloride SA (K-DUR,KLOR-CON) 20 MEQ tablet Take 2 tablets (40 mEq total) by mouth daily. 12/01/12   Chrystie Nose, MD  spironolactone (ALDACTONE) 25 MG tablet Take 0.5 tablets (12.5 mg total) by mouth daily. Need appointment for future refills. 11/02/13   Chrystie Nose, MD  tiotropium (SPIRIVA) 18 MCG inhalation capsule Place 1 capsule (18 mcg total) into inhaler and inhale daily. 06/17/13   Kathlen Mody, MD  triamcinolone (KENALOG) 0.025 % ointment Apply 1 application topically 2 (two) times daily. 10/26/13   Tobey Grim, MD   BP 132/74  Pulse 118  Temp(Src) 97.6 F (36.4 C) (Oral)  Resp 20  Ht 5\' 8"  (1.727 m)  Wt 188 lb (85.276 kg)  BMI 28.59 kg/m2  SpO2 97% Physical Exam  Nursing note and vitals reviewed. Constitutional: He is oriented to person, place, and time. He appears well-developed and well-nourished. No distress.  HENT:  Head: Normocephalic and atraumatic.  Mouth/Throat: No oropharyngeal exudate.  Eyes: EOM are normal. Pupils are equal, round, and reactive to light.  Neck: Normal range of motion. Neck supple.  Cardiovascular: Regular rhythm.  Tachycardia present.  Exam reveals no  friction rub.   No murmur heard. Pulmonary/Chest: Effort normal and breath sounds normal. No respiratory distress. He has no wheezes. He has no rales.  Abdominal: He exhibits no distension. There is no tenderness. There is no rebound.  Musculoskeletal: Normal range of motion. He exhibits no edema.  Neurological: He is alert and oriented to person, place, and time.  Skin: He is not diaphoretic.    ED Course  Procedures (including critical care time) Labs Review Labs Reviewed  CBC WITH DIFFERENTIAL - Abnormal; Notable for the following:    RBC 3.96 (*)    Hemoglobin 12.5 (*)    HCT 35.8 (*)    Monocytes Relative 18 (*)    All other components within normal limits  COMPREHENSIVE METABOLIC PANEL - Abnormal; Notable for the following:  Sodium 133 (*)    Chloride 90 (*)    Glucose, Bld 144 (*)    AST 55 (*)    Alkaline Phosphatase 124 (*)    Anion gap 19 (*)    All other components within normal limits  CLOSTRIDIUM DIFFICILE BY PCR  LIPASE, BLOOD  I-STAT CG4 LACTIC ACID, ED    Imaging Review No results found.   EKG Interpretation   Date/Time:  Sunday November 28 2013 17:06:48 EDT Ventricular Rate:  117 PR Interval:  280 QRS Duration: 146 QT Interval:  344 QTC Calculation: 480 R Axis:   -127 Text Interpretation:  Sinus tachycardia Prolonged PR interval Right atrial  enlargement Right bundle branch block Baseline wander in lead(s) V6  Similar to prior Confirmed by Gwendolyn Grant  MD, Jaaliyah Lucatero ([id number]) on 11/28/2013  5:12:51 PM      MDM   Final diagnoses:  Diarrhea  Tachycardia    8M presents with diarrhea. Recently admitted for colitis, diarrhea was present prior to this. He did receive cipro/flagyl for his colitis. Diarrhea briefly improved after antibiosis, but has gotten worse again. No fevers. No vomiting. No abdominal pain. Is currently drinking, last drink this morning. Here tachycardic, afebrile. Normotensive. No abdominal pain.  Labs show hypochloremia. With his  tachycardia and persistent diarrhea, will hydrate, plan for admission.  Family Medicine admitting.  Elwin Mocha, MD 11/29/13 [phone]

## 2013-11-28 NOTE — H&P (Signed)
Family Medicine Teaching [facility] Admission History and Physical Service Pager: [phone]  Patient name: Juan [name] Medical record number: [id number] Date of birth: [birth year] Age: 63 y.o. Gender: male  Primary Care Provider: Renold Don, MD Consultants: None Code Status: Full code  Chief Complaint: Diarrhea  Assessment and Plan: Juan [name] is a 63 y.o. male presenting with diarrhea. PMH is significant for alcoholism, HTN, diastolic HF, hyponatremia, seizures 2/2 alcohol withdrawal.  #Dehydration 2/2 Diarrhea - recent admission to [facility] for colitis, treated with cipro/flagyl x7d.  Diarrhea has again worsened. Negative C diff, O&P, stool culture at [facility]. Recent CT abd/pelvis (10/15) with chronic colitis without obstruction. Lactic acid elevated to 4.34 on admission.  No leukocytosis or fever.Unclear etiology at this time. - Admit to tele under FPTS, attending McDiarmid - f/u C Diff PCR - NS @ 130mL/hr - PT/OT consult for weakness  #Lactic acidosis: Likely 2/2 volume depletion/poor perfusion. Lactic acid 4.34, AG 19. - IVF as above - Continue to monitor  #Diastolic heart failure: Echo in 09/2013 with EF 60-65%, grade 1 diastolic dysfunction. Appear euvolemic on exam. - Monitor fluid status carefully - Daily weights and strict Is&Os - f/u CXR - Holding home Lasix  #Hyponatremia: Na 133 on admission (seems to be around baseline). Likely related to chronic alcohol abuse. - Continue to monitor - IVF as above  #Alcohol Abuse: patient has h/o alcohol withdrawal seizures. AST elevated. - Monitor on CIWA protocol with scheduled ativan - SW consult for substance abuse  #Umbilical hernia - shown to be fat containing on recent CT abd/pelvis (10/15). No signs of incareration currently.  - Consider consult to gen surgery in AM - Continue to monitor  #HTN: BP mildly elevated on admission - Continue home Spironolactone, Coreg, Lisinopril - Continue to monitor   #COPD:  Diffusely wheezing on exam. Satting well on RA. - Continue to monitor respiratory status - Continue home spiriva - Albuterol nebs q6h prn  #Psoriasis: On Triamancinolone at home - Lidex ointment BID  FEN/GI: Clear liquids, NS @ 128mL/hr Prophylaxis: Sub q heparin  Disposition: Admit to FPTS, attending McDiarmid. Dispo pending improvement in symptoms  History of Present Illness: Juan [name] is a 63 y.o. male presenting with diarrhea.  PMH is significant for alcoholism, HTN, diastolic HF, hyponatremia, seizures due to alcohol withdrawal.  Patient presents with diarrhea x many months, which has worsened in the last few weeks.  He was recently hospitalized at [facility] on 10/15 for colitis and treated with Cipro/Flagyl x7 days.  He and his wife report that he got better after IVF, ate 2 meals and was discharged home on antibiotics.  He then got worse on day after discharge.  He has urgency with diarrhea and sometimes does not make it to the bathroom.  Patient also reports having no appetite and anorexia x3wks.  He reports that at most he eats a cup of soup per day.  He also has generalized weakness with a few falls since August without any head injury or LOC,  He reports losing his balance as the reason for his falls.   Wife reports that pt has an "alcohol problem" and has h/o seizures twice 2/2 withdrawal.  Patient endorses SOB, but denies any fever/chills, BRBPR, CP, melena, vomiting.  Review Of Systems: Per HPI with the following additions: none Otherwise 12 point review of systems was performed and was unremarkable.  Patient Active Problem List   Diagnosis Date Noted  . ETOH abuse 11/18/2013  . Rash and nonspecific  skin eruption 10/28/2013  . Bronchiectasis without acute exacerbation 06/30/2013  . Lung nodule seen on imaging study 06/15/2013  . Chronic systolic heart failure 06/13/2013  . Transaminitis 06/13/2013  . RBBB 11/09/2012  . Hyponatremia 11/08/2012  . Seizure due to alcohol  withdrawal 11/08/2012  . Umbilical hernia 08/06/2012  . Smoker unmotivated to quit 07/22/2012  . Tremor 07/30/2011  . HTN (hypertension) 07/30/2011  . Hepatomegaly 11/05/2010  . Depression 11/05/2010  . Anxiety 11/05/2010  . Allergic rhinitis 11/05/2010   Past Medical History: Past Medical History  Diagnosis Date  . CHF (congestive heart failure) 10/2010    ECHO:  EF 40%, Grade II diastolic dysfunction  . Alcoholism   . Allergy     seasonal   . Anxiety   . Depression   . Asthma   . Hyperlipidemia   . Hypertension   . Seizures    Past Surgical History: Past Surgical History  Procedure Laterality Date  . Removal of ingrown toenail     Social History: History  Substance Use Topics  . Smoking status: Current Every Day Smoker -- 1.00 packs/day for 40 years    Types: Cigarettes  . Smokeless tobacco: Never Used     Comment: currently smokes less than 1 ppd  . Alcohol Use: Yes     Comment: 4-5 burbon's a day for the past 15 years   Additional social history: current smoker - 1-1.5 ppd, drinks 2- 1.5L of bourbon per week (thought about quitting) - last drink around noon Please also refer to relevant sections of EMR.  Family History: Family History  Problem Relation Age of Onset  . Alzheimer's disease Mother   . Diabetes Mother   . Heart disease Maternal Uncle    Allergies and Medications: No Known Allergies No current facility-administered medications on file prior to encounter.   Current Outpatient Prescriptions on File Prior to Encounter  Medication Sig Dispense Refill  . acetaminophen (TYLENOL) 500 MG tablet Take 1,500 mg by mouth every 6 (six) hours as needed for headache (headache).       Marland Kitchen. aspirin EC 81 MG EC tablet Take 1 tablet (81 mg total) by mouth daily.  90 tablet  0  . atorvastatin (LIPITOR) 40 MG tablet Take 1 tablet (40 mg total) by mouth daily at 6 PM. Need appointment for future refills.  30 tablet  0  . B-Complex CAPS Take 1 capsule by mouth daily.       . carvedilol (COREG) 6.25 MG tablet Take 1 tablet (6.25 mg total) by mouth 2 (two) times daily with a meal. <please schedule appointment for future refills>  60 tablet  2  . Cholecalciferol (VITAMIN D-3) 1000 UNITS CAPS Take 1,000 Units by mouth daily.      . ciprofloxacin (CIPRO) 500 MG tablet Take 1 tablet (500 mg total) by mouth 2 (two) times daily.  12 tablet  0  . clonazePAM (KLONOPIN) 0.5 MG tablet Take 0.5 mg by mouth 2 (two) times daily as needed for anxiety (anxiety).       Marland Kitchen. Ephedrine-Guaifenesin (PRIMATENE ASTHMA) 12.5-200 MG TABS Take 2 tablets by mouth 2 (two) times daily as needed (shortness of breath).       . fluticasone (FLONASE) 50 MCG/ACT nasal spray Use two sprays in each nostril daily  16 g  11  . furosemide (LASIX) 40 MG tablet Take 1 tablet (40 mg total) by mouth daily. Need appointment for future refills.  30 tablet  0  . gabapentin (NEURONTIN) 300  MG capsule Take 1 capsule (300 mg total) by mouth 3 (three) times daily.  90 capsule  0  . ipratropium (ATROVENT HFA) 17 MCG/ACT inhaler Inhale 2 puffs into the lungs 3 (three) times daily as needed for wheezing (wheezing).       . Ipratropium-Albuterol (COMBIVENT RESPIMAT) 20-100 MCG/ACT AERS respimat Inhale 2 puffs into the lungs every 6 (six) hours as needed for wheezing (wheezing).      Marland Kitchen lisinopril (PRINIVIL,ZESTRIL) 20 MG tablet Take 20 mg by mouth daily.      . magnesium oxide (MAG-OX) 400 MG tablet Take 800 mg by mouth daily.      . metroNIDAZOLE (FLAGYL) 500 MG tablet Take 1 tablet (500 mg total) by mouth 3 (three) times daily.  18 tablet  0  . montelukast (SINGULAIR) 10 MG tablet Take 1 tablet (10 mg total) by mouth at bedtime.  30 tablet  3  . Multiple Vitamin (MULTIVITAMIN WITH MINERALS) TABS tablet Take 1 tablet by mouth daily.      . potassium chloride SA (K-DUR,KLOR-CON) 20 MEQ tablet Take 2 tablets (40 mEq total) by mouth daily.  60 tablet  11  . spironolactone (ALDACTONE) 25 MG tablet Take 0.5 tablets (12.5 mg  total) by mouth daily. Need appointment for future refills.  15 tablet  0  . tiotropium (SPIRIVA) 18 MCG inhalation capsule Place 1 capsule (18 mcg total) into inhaler and inhale daily.  30 capsule  1  . triamcinolone (KENALOG) 0.025 % ointment Apply 1 application topically 2 (two) times daily.  30 g  0  . [DISCONTINUED] albuterol (PROVENTIL,VENTOLIN) 90 MCG/ACT inhaler Inhale 2 puffs into the lungs every 6 (six) hours as needed for wheezing.  17 g  0    Objective: BP 159/94  Pulse 120  Temp(Src) 97.6 F (36.4 C) (Oral)  Resp 18  Ht 5\' 8"  (1.727 m)  Wt 188 lb (85.276 kg)  BMI 28.59 kg/m2  SpO2 95% Exam: General: Sitting in bed in NAD, tremulous HEENT: NCAT, PERRL, dry MM, EOMI, OP clear Cardiovascular: RRR, no m/r/g Respiratory: Diffuse wheezing in all lung fields, no crackles/rhonchi Abdomen: Soft, NDNT, no hepatomegaly, +BS. Umbilical hernia without BS, darkened skin overlying. Extremities: No edema Skin: Psoriatic rash covering much of anterior LEs, less severe on UEs, overlying umbilical hernia in addition.  Some excoriations around rash.  Neuro: CN 2-12 grossly intact. A&Ox3, no focal deficits.  Labs and Imaging: CBC BMET   Recent Labs Lab 11/28/13 1538  WBC 4.4  HGB 12.5*  HCT 35.8*  PLT 284    Recent Labs Lab 11/28/13 1538  NA 133*  K 4.1  CL 90*  CO2 24  BUN 12  CREATININE 0.83  GLUCOSE 144*  CALCIUM 8.7      Lactic acid 4.34  EKG (10/25): Sinus tachycardia, RBBB, non-ischemic  Shirlee Latch, MD 11/28/2013, 9:04 PM PGY-1, [facility] Family Medicine FPTS Intern pager: [phone], text pages welcome  I have seen and evaluated the above patient.  I agree with the note. Addendum in blue.  Everlene Other DO Family Medicine PGY-3 Pager: [phone]

## 2013-11-28 NOTE — ED Notes (Signed)
Pt here for diarrhea and generalized weakness with poor PO intake x 3 weeks; pt at [facility] for same on 1022-10/23

## 2013-11-28 NOTE — ED Notes (Signed)
MD at bedside. 

## 2013-11-28 NOTE — ED Notes (Signed)
PT st's no apatite for food but has been drinking ETOH everyday.  Last ETOH prior to coming to ED

## 2013-11-29 ENCOUNTER — Inpatient Hospital Stay (HOSPITAL_COMMUNITY): Payer: No Typology Code available for payment source

## 2013-11-29 DIAGNOSIS — F332 Major depressive disorder, recurrent severe without psychotic features: Secondary | ICD-10-CM

## 2013-11-29 DIAGNOSIS — E43 Unspecified severe protein-calorie malnutrition: Secondary | ICD-10-CM | POA: Insufficient documentation

## 2013-11-29 DIAGNOSIS — F1010 Alcohol abuse, uncomplicated: Secondary | ICD-10-CM

## 2013-11-29 LAB — CBC
HCT: 29.2 % — ABNORMAL LOW (ref 39.0–52.0)
HEMOGLOBIN: 10.1 g/dL — AB (ref 13.0–17.0)
MCH: 32.2 pg (ref 26.0–34.0)
MCHC: 34.6 g/dL (ref 30.0–36.0)
MCV: 93 fL (ref 78.0–100.0)
Platelets: 252 10*3/uL (ref 150–400)
RBC: 3.14 MIL/uL — ABNORMAL LOW (ref 4.22–5.81)
RDW: 14.5 % (ref 11.5–15.5)
WBC: 8.1 10*3/uL (ref 4.0–10.5)

## 2013-11-29 LAB — COMPREHENSIVE METABOLIC PANEL
ALK PHOS: 105 U/L (ref 39–117)
ALT: 24 U/L (ref 0–53)
ANION GAP: 17 — AB (ref 5–15)
AST: 60 U/L — ABNORMAL HIGH (ref 0–37)
Albumin: 3 g/dL — ABNORMAL LOW (ref 3.5–5.2)
BUN: 10 mg/dL (ref 6–23)
CO2: 21 mEq/L (ref 19–32)
Calcium: 8.1 mg/dL — ABNORMAL LOW (ref 8.4–10.5)
Chloride: 91 mEq/L — ABNORMAL LOW (ref 96–112)
Creatinine, Ser: 0.88 mg/dL (ref 0.50–1.35)
GFR calc non Af Amer: >90 mL/min (ref >90)
GLUCOSE: 119 mg/dL — AB (ref 70–99)
Potassium: 3.8 mEq/L (ref 3.7–5.3)
Sodium: 129 mEq/L — ABNORMAL LOW (ref 137–147)
TOTAL PROTEIN: 6.5 g/dL (ref 6.0–8.3)
Total Bilirubin: 0.8 mg/dL (ref 0.3–1.2)

## 2013-11-29 LAB — LACTIC ACID, PLASMA
Lactic Acid, Venous: 3.2 mmol/L — ABNORMAL HIGH (ref 0.5–2.2)
Lactic Acid, Venous: 4.4 mmol/L — ABNORMAL HIGH (ref 0.5–2.2)

## 2013-11-29 LAB — PROTIME-INR
INR: 1.03 (ref 0.00–1.49)
PROTHROMBIN TIME: 13.6 s (ref 11.6–15.2)

## 2013-11-29 LAB — CLOSTRIDIUM DIFFICILE BY PCR: Toxigenic C. Difficile by PCR: NEGATIVE

## 2013-11-29 LAB — TROPONIN I
Troponin I: <0.3 ng/mL (ref <0.30)
Troponin I: <0.3 ng/mL (ref <0.30)

## 2013-11-29 LAB — MAGNESIUM: Magnesium: 1.5 mg/dL (ref 1.5–2.5)

## 2013-11-29 LAB — PHOSPHORUS: Phosphorus: 2.9 mg/dL (ref 2.3–4.6)

## 2013-11-29 MED ORDER — BOOST / RESOURCE BREEZE PO LIQD
1.0000 | Freq: Three times a day (TID) | ORAL | Status: DC
  Administered 2013-11-29 – 2013-12-01 (×6): 1 via ORAL

## 2013-11-29 MED ORDER — VITAMIN B-1 100 MG PO TABS
100.0000 mg | ORAL_TABLET | Freq: Every day | ORAL | Status: DC
  Administered 2013-11-30 – 2013-12-01 (×2): 100 mg via ORAL
  Filled 2013-11-29 (×2): qty 1

## 2013-11-29 MED ORDER — LORAZEPAM 2 MG/ML IJ SOLN
2.0000 mg | INTRAMUSCULAR | Status: DC | PRN
Start: 2013-11-29 — End: 2013-12-01
  Administered 2013-12-01: 2 mg via INTRAVENOUS
  Filled 2013-11-29: qty 1

## 2013-11-29 MED ORDER — FOLIC ACID 1 MG PO TABS
1.0000 mg | ORAL_TABLET | Freq: Every day | ORAL | Status: DC
  Filled 2013-11-29: qty 1

## 2013-11-29 MED ORDER — ADULT MULTIVITAMIN W/MINERALS CH
1.0000 | ORAL_TABLET | Freq: Every day | ORAL | Status: DC
  Filled 2013-11-29: qty 1

## 2013-11-29 MED ORDER — ADULT MULTIVITAMIN W/MINERALS CH
1.0000 | ORAL_TABLET | Freq: Every day | ORAL | Status: DC
  Administered 2013-11-30 – 2013-12-01 (×2): 1 via ORAL
  Filled 2013-11-29 (×2): qty 1

## 2013-11-29 MED ORDER — LORAZEPAM 2 MG/ML IJ SOLN
2.0000 mg | Freq: Four times a day (QID) | INTRAMUSCULAR | Status: DC
  Administered 2013-11-29 – 2013-11-30 (×4): 2 mg via INTRAVENOUS
  Filled 2013-11-29 (×4): qty 1

## 2013-11-29 MED ORDER — DULOXETINE HCL 30 MG PO CPEP
30.0000 mg | ORAL_CAPSULE | Freq: Every day | ORAL | Status: DC
  Administered 2013-11-30 – 2013-12-01 (×2): 30 mg via ORAL
  Filled 2013-11-29 (×3): qty 1

## 2013-11-29 MED ORDER — SODIUM CHLORIDE 0.9 % IV BOLUS (SEPSIS)
1000.0000 mL | Freq: Once | INTRAVENOUS | Status: AC
  Administered 2013-11-29: 1000 mL via INTRAVENOUS

## 2013-11-29 MED ORDER — FOLIC ACID 1 MG PO TABS
1.0000 mg | ORAL_TABLET | Freq: Every day | ORAL | Status: DC
  Administered 2013-11-30 – 2013-12-01 (×2): 1 mg via ORAL
  Filled 2013-11-29 (×2): qty 1

## 2013-11-29 NOTE — Evaluation (Signed)
Occupational Therapy Evaluation and Discharge Patient Details Name: Juan [name] MRN: [id number] DOB: 12-13-1950 Today's Date: 11/29/2013    History of Present Illness Juan [name] is a 63 y.o. male presenting with diarrhea; dx with colitis at [facility] 10/15. PMH is significant for alcoholism, HTN, diastolic HF, hyponatremia, seizures 2/2 alcohol withdrawal   Clinical Impression   This 63 yo male admitted with above presents to acute OT at a S/min guard A level and feel he will progress back to his Mod I to intermittent S level fairly quick once he is able to eat and food not run through him. His wife and the patient report that the plan is for him to go to ETOH rehab, feel based off of what I have seen today he should be able to go there and manage for himself. If he should get weaker then he will need STSNF prior to D/C to ETOH rehab. Acute OT will sign off.    Follow Up Recommendations  No OT follow up    Equipment Recommendations  None recommended by OT       Precautions / Restrictions Precautions Precautions: Fall Restrictions Weight Bearing Restrictions: No      Mobility Bed Mobility               General bed mobility comments: Pt in recliner upon my arrival  Transfers Overall transfer level: Needs assistance Equipment used:  (walking stick)             General transfer comment: min guard A    Balance Overall balance assessment: Needs assistance Sitting-balance support: Feet supported;No upper extremity supported Sitting balance-Leahy Scale: Good     Standing balance support: Single extremity supported Standing balance-Leahy Scale: Fair                              ADL Overall ADL's : Needs assistance/impaired                                       General ADL Comments: Pt is overall at a S/min guard A for BADLs--feel that once he can eat and keep food from running through him that his strength will improve                      Hand Dominance Right      Communication Communication Communication: No difficulties                 Home Living Family/patient expects to be discharged to:: Private residence Living Arrangements: Spouse/significant other Available Help at Discharge: Family;Available PRN/intermittently Type of Home: House Home Access: Stairs to enter [employer] of Steps: 1         Bathroom Shower/Tub: Tub/shower unit;Curtain Shower/tub characteristics: Engineer, building services: Standard     Home Equipment:  (walking stick and wife says she also has a cane he can use)   Additional Comments: walking stick is not good due to it does not have a safe no slid tip on the bottom      Prior Functioning/Environment Level of Independence: Independent             OT Diagnosis: Generalized weakness         OT Goals(Current goals can be found in the care plan section) Acute Rehab OT Goals Patient Stated Goal: to  ETOH rehab                End of Session Equipment Utilized During Treatment:  (walking stick)  Activity Tolerance: Patient tolerated treatment well Patient left: in chair;with call bell/phone within reach;with family/visitor present   Time: 1138-1205 OT Time Calculation (min): 27 min Charges:  OT General Charges $OT Visit: 1 Procedure OT Evaluation $Initial OT Evaluation Tier I: 1 Procedure OT Treatments $Self Care/Home Management : 8-22 mins   Evette GeorgesLeonard, [place] [phone] 11/29/2013, 1:23 PM

## 2013-11-29 NOTE — Progress Notes (Signed)
FMTS Attending Note Patinet's care discussed with resident team and I agree with Dr Penelope Coop note for today.  Paula Compton, MD

## 2013-11-29 NOTE — Consult Note (Signed)
[facility] Consult   Reason for Consult:  Capacity evaluation and depression, hx of Alcohol dependence, and alcohol withdrawal seizure. Referring Physician: Lavon Paganini, MD  Juan [name] is an 63 y.o. male. Total Time spent with patient: 45 minutes  Assessment: AXIS I:  Alcohol Abuse and Major Depression, Recurrent severe AXIS II:  Deferred AXIS III:   Past Medical History  Diagnosis Date  . CHF (congestive heart failure) 10/2010    ECHO:  EF 40%, Grade II diastolic dysfunction  . Alcoholism   . Allergy     seasonal   . Anxiety   . Depression   . Asthma   . Hyperlipidemia   . Hypertension   . Seizures    AXIS IV:  other psychosocial or environmental problems, problems related to social environment and problems with primary support group AXIS V:  51-60 moderate symptoms  Plan:  Paged Dr. Brita Romp at [phone]. Patient has capacity to make his own medical decisions and living arrangements Continue alcohol detox treatment as planned and refer to out patient substance abuse treatment when medically stable Start Cymbalta 30 mg PO QD for depression and continue Neurontin 300 mg TID for anxiety  This patient has no safety concerns No evidence of imminent risk to self or others at present.   Patient does not meet criteria for psychiatric inpatient admission. Supportive therapy provided about ongoing stressors. Discussed crisis plan, support from social network, calling 911, coming to the Emergency Department, and calling Suicide Hotline. Appreciate psychiatric consultation Please contact [phone] if needs further assistance  Subjective:   Juan [name] is a 63 y.o. male is seen for face to face psychiatric consultation for alcohol abuse vs dependence, depression, anxiety and capacity evaluation. Patient spouse is at bed side and provided brief information. Patient endorses being depressed and having anxiety and seeing out patient psychiatrist at  [facility] psychiatry and partial compliance with medications. He also endorsing regular alcohol ingestion and increased liver enzymes. Her has denied current symptoms of suicidal or homicidal ideation, intention or plans. He has no evidence of psychosis. He has history seizures due to alcohol withdrawal but denied history of detox treatment and rehab treatment.  Medical history: Juan [name] is a 63 y.o. male presenting with diarrhea. PMH is significant for alcoholism, HTN, diastolic HF, hyponatremia, seizures due to alcohol withdrawal. Patient presents with diarrhea x many months, which has worsened in the last few weeks. He was recently hospitalized at [facility] on 10/15 for colitis and treated with Cipro/Flagyl x7 days. He and his wife report that he got better after IVF, ate 2 meals and was discharged home on antibiotics. He then got worse on day after discharge. He has urgency with diarrhea and sometimes does not make it to the bathroom. Patient also reports having no appetite and anorexia x3wks. He reports that at most he eats a cup of soup per day. He also has generalized weakness with a few falls since August without any head injury or LOC, He reports losing his balance as the reason for his falls. Wife reports that pt has an "alcohol problem" and has h/o seizures twice 2/2 withdrawal. Patient endorses SOB, but denies any fever/chills, BRBPR, CP, melena, vomiting.   Review Of Systems: Per HPI with the following additions: none  Otherwise 12 point review of systems was performed and was unremarkable.  HPI Elements:   Location:  depression and alcohol . Quality:  fair with treatment. Severity:  wants to get detox and  counseling . Timing:  physically sick with stomach upset.  Past Psychiatric History: Past Medical History  Diagnosis Date  . CHF (congestive heart failure) 10/2010    ECHO:  EF 40%, Grade II diastolic dysfunction  . Alcoholism   . Allergy     seasonal   . Anxiety   . Depression    . Asthma   . Hyperlipidemia   . Hypertension   . Seizures     reports that he has been smoking Cigarettes.  He has a 40 pack-year smoking history. He has never used smokeless tobacco. He reports that he drinks alcohol. He reports that he does not use illicit drugs. Family History  Problem Relation Age of Onset  . Alzheimer's disease Mother   . Diabetes Mother   . Heart disease Maternal Uncle      Living Arrangements: Spouse/significant other   Abuse/Neglect [facility]) Physical Abuse: Denies Verbal Abuse: Denies Sexual Abuse: Denies Allergies:  No Known Allergies  ACT Assessment Complete:  NO Objective: Blood pressure 142/89, pulse 106, temperature 98.7 F (37.1 C), temperature source Oral, resp. rate 18, height '5\' 8"'  (1.727 m), weight 83.5 kg (184 lb 1.4 oz), SpO2 97.00%.Body mass index is 28 kg/(m^2). Results for orders placed during the hospital encounter of 11/28/13 (from the past 72 hour(s))  CBC WITH DIFFERENTIAL     Status: Abnormal   Collection Time    11/28/13  3:38 PM      Result Value Ref Range   WBC 4.4  4.0 - 10.5 K/uL   RBC 3.96 (*) 4.22 - 5.81 MIL/uL   Hemoglobin 12.5 (*) 13.0 - 17.0 g/dL   HCT 35.8 (*) 39.0 - 52.0 %   MCV 90.4  78.0 - 100.0 fL   MCH 31.6  26.0 - 34.0 pg   MCHC 34.9  30.0 - 36.0 g/dL   RDW 14.1  11.5 - 15.5 %   Platelets 284  150 - 400 K/uL   Neutrophils Relative % 56  43 - 77 %   Neutro Abs 2.5  1.7 - 7.7 K/uL   Lymphocytes Relative 24  12 - 46 %   Lymphs Abs 1.1  0.7 - 4.0 K/uL   Monocytes Relative 18 (*) 3 - 12 %   Monocytes Absolute 0.8  0.1 - 1.0 K/uL   Eosinophils Relative 1  0 - 5 %   Eosinophils Absolute 0.1  0.0 - 0.7 K/uL   Basophils Relative 1  0 - 1 %   Basophils Absolute 0.1  0.0 - 0.1 K/uL  COMPREHENSIVE METABOLIC PANEL     Status: Abnormal   Collection Time    11/28/13  3:38 PM      Result Value Ref Range   Sodium 133 (*) 137 - 147 mEq/L   Potassium 4.1  3.7 - 5.3 mEq/L   Chloride 90 (*) 96 - 112 mEq/L   CO2 24  19 -  32 mEq/L   Glucose, Bld 144 (*) 70 - 99 mg/dL   BUN 12  6 - 23 mg/dL   Creatinine, Ser 0.83  0.50 - 1.35 mg/dL   Calcium 8.7  8.4 - 10.5 mg/dL   Total Protein 7.7  6.0 - 8.3 g/dL   Albumin 3.5  3.5 - 5.2 g/dL   AST 55 (*) 0 - 37 U/L   ALT 27  0 - 53 U/L   Alkaline Phosphatase 124 (*) 39 - 117 U/L   Total Bilirubin 0.3  0.3 - 1.2 mg/dL   GFR  calc non Af Amer >90  >90 mL/min   GFR calc Af Amer >90  >90 mL/min   Comment: (NOTE)     The eGFR has been calculated using the CKD EPI equation.     This calculation has not been validated in all clinical situations.     eGFR's persistently <90 mL/min signify possible Chronic Kidney     Disease.   Anion gap 19 (*) 5 - 15  LIPASE, BLOOD     Status: None   Collection Time    11/28/13  3:38 PM      Result Value Ref Range   Lipase 25  11 - 59 U/L  I-STAT CG4 LACTIC ACID, ED     Status: Abnormal   Collection Time    11/28/13  5:30 PM      Result Value Ref Range   Lactic Acid, Venous 4.34 (*) 0.5 - 2.2 mmol/L  TROPONIN I     Status: None   Collection Time    11/28/13 11:55 PM      Result Value Ref Range   Troponin I <0.30  <0.30 ng/mL   Comment:            Due to the release kinetics of cTnI,     a negative result within the first hours     of the onset of symptoms does not rule out     myocardial infarction with certainty.     If myocardial infarction is still suspected,     repeat the test at appropriate intervals.  LACTIC ACID, PLASMA     Status: Abnormal   Collection Time    11/28/13 11:58 PM      Result Value Ref Range   Lactic Acid, Venous 4.4 (*) 0.5 - 2.2 mmol/L  CLOSTRIDIUM DIFFICILE BY PCR     Status: None   Collection Time    11/29/13  4:55 AM      Result Value Ref Range   C difficile by pcr NEGATIVE  NEGATIVE  COMPREHENSIVE METABOLIC PANEL     Status: Abnormal   Collection Time    11/29/13  5:15 AM      Result Value Ref Range   Sodium 129 (*) 137 - 147 mEq/L   Potassium 3.8  3.7 - 5.3 mEq/L   Chloride 91 (*) 96 -  112 mEq/L   CO2 21  19 - 32 mEq/L   Glucose, Bld 119 (*) 70 - 99 mg/dL   BUN 10  6 - 23 mg/dL   Creatinine, Ser 0.88  0.50 - 1.35 mg/dL   Calcium 8.1 (*) 8.4 - 10.5 mg/dL   Total Protein 6.5  6.0 - 8.3 g/dL   Albumin 3.0 (*) 3.5 - 5.2 g/dL   AST 60 (*) 0 - 37 U/L   ALT 24  0 - 53 U/L   Alkaline Phosphatase 105  39 - 117 U/L   Total Bilirubin 0.8  0.3 - 1.2 mg/dL   GFR calc non Af Amer >90  >90 mL/min   GFR calc Af Amer >90  >90 mL/min   Comment: (NOTE)     The eGFR has been calculated using the CKD EPI equation.     This calculation has not been validated in all clinical situations.     eGFR's persistently <90 mL/min signify possible Chronic Kidney     Disease.   Anion gap 17 (*) 5 - 15  CBC     Status: Abnormal   Collection Time  11/29/13  5:15 AM      Result Value Ref Range   WBC 8.1  4.0 - 10.5 K/uL   RBC 3.14 (*) 4.22 - 5.81 MIL/uL   Hemoglobin 10.1 (*) 13.0 - 17.0 g/dL   Comment: REPEATED TO VERIFY   HCT 29.2 (*) 39.0 - 52.0 %   MCV 93.0  78.0 - 100.0 fL   MCH 32.2  26.0 - 34.0 pg   MCHC 34.6  30.0 - 36.0 g/dL   RDW 14.5  11.5 - 15.5 %   Platelets 252  150 - 400 K/uL  PROTIME-INR     Status: None   Collection Time    11/29/13  5:15 AM      Result Value Ref Range   Prothrombin Time 13.6  11.6 - 15.2 seconds   INR 1.03  0.00 - 1.49  MAGNESIUM     Status: None   Collection Time    11/29/13  5:15 AM      Result Value Ref Range   Magnesium 1.5  1.5 - 2.5 mg/dL  PHOSPHORUS     Status: None   Collection Time    11/29/13  5:15 AM      Result Value Ref Range   Phosphorus 2.9  2.3 - 4.6 mg/dL  TROPONIN I     Status: None   Collection Time    11/29/13  5:15 AM      Result Value Ref Range   Troponin I <0.30  <0.30 ng/mL   Comment:            Due to the release kinetics of cTnI,     a negative result within the first hours     of the onset of symptoms does not rule out     myocardial infarction with certainty.     If myocardial infarction is still suspected,      repeat the test at appropriate intervals.  LACTIC ACID, PLASMA     Status: Abnormal   Collection Time    11/29/13  7:02 AM      Result Value Ref Range   Lactic Acid, Venous 3.2 (*) 0.5 - 2.2 mmol/L  TROPONIN I     Status: None   Collection Time    11/29/13 10:50 AM      Result Value Ref Range   Troponin I <0.30  <0.30 ng/mL   Comment:            Due to the release kinetics of cTnI,     a negative result within the first hours     of the onset of symptoms does not rule out     myocardial infarction with certainty.     If myocardial infarction is still suspected,     repeat the test at appropriate intervals.   Labs are reviewed and are pertinent for elevated AST.  Current Facility-Administered Medications  Medication Dose Route Frequency Provider Last Rate Last Dose  . 0.9 %  sodium chloride infusion   Intravenous Continuous Coral Spikes, DO 100 mL/hr at 11/29/13 1761    . acetaminophen (TYLENOL) tablet 650 mg  650 mg Oral Q6H PRN Coral Spikes, DO       Or  . acetaminophen (TYLENOL) suppository 650 mg  650 mg Rectal Q6H PRN Coral Spikes, DO      . albuterol (PROVENTIL) (2.5 MG/3ML) 0.083% nebulizer solution 5 mg  5 mg Nebulization Q6H PRN Jayce G Cook, DO   2.5 mg at  11/29/13 1126  . aspirin EC tablet 81 mg  81 mg Oral Daily Jayce G Cook, DO   81 mg at 11/29/13 1001  . atorvastatin (LIPITOR) tablet 40 mg  40 mg Oral q1800 Jayce G Cook, DO      . carvedilol (COREG) tablet 6.25 mg  6.25 mg Oral BID WC Coral Spikes, DO   6.25 mg at 11/29/13 0859  . fluocinonide ointment (LIDEX) 0.05 %   Topical BID Coral Spikes, DO   1 application at 55/37/48 1001  . folic acid (FOLVITE) tablet 1 mg  1 mg Oral Daily Jayce G Cook, DO   1 mg at 11/29/13 1001  . gabapentin (NEURONTIN) capsule 300 mg  300 mg Oral TID Coral Spikes, DO   300 mg at 11/29/13 1001  . heparin injection 5,000 Units  5,000 Units Subcutaneous 3 times per day Coral Spikes, DO   5,000 Units at 11/29/13 0556  . lisinopril  (PRINIVIL,ZESTRIL) tablet 20 mg  20 mg Oral Daily Jayce G Cook, DO   20 mg at 11/29/13 1001  . LORazepam (ATIVAN) injection 0-4 mg  0-4 mg Intravenous Q6H Jayce G Cook, DO   2 mg at 11/29/13 1001   Followed by  . [START ON 11/30/2013] LORazepam (ATIVAN) injection 0-4 mg  0-4 mg Intravenous Q12H Jackson, DO      . LORazepam (ATIVAN) tablet 1 mg  1 mg Oral Q6H PRN Coral Spikes, DO       Or  . LORazepam (ATIVAN) injection 1 mg  1 mg Intravenous Q6H PRN Coral Spikes, DO   1 mg at 11/29/13 0445  . montelukast (SINGULAIR) tablet 10 mg  10 mg Oral QHS Coral Spikes, DO   10 mg at 11/29/13 0023  . multivitamin with minerals tablet 1 tablet  1 tablet Oral Daily Coral Spikes, DO   1 tablet at 11/29/13 1001  . ondansetron (ZOFRAN) tablet 4 mg  4 mg Oral Q6H PRN Coral Spikes, DO       Or  . ondansetron (ZOFRAN) injection 4 mg  4 mg Intravenous Q6H PRN Coral Spikes, DO   4 mg at 11/29/13 0003  . spironolactone (ALDACTONE) tablet 12.5 mg  12.5 mg Oral Daily Jayce G Cook, DO   12.5 mg at 11/29/13 1001  . thiamine (VITAMIN B-1) tablet 100 mg  100 mg Oral Daily Coral Spikes, DO   100 mg at 11/29/13 1001   Or  . thiamine (B-1) injection 100 mg  100 mg Intravenous Daily Coral Spikes, DO      . tiotropium (SPIRIVA) inhalation capsule 18 mcg  18 mcg Inhalation Daily Coral Spikes, DO        Psychiatric Specialty Exam: Physical Exam as per history and physical  Review of Systems  Constitutional: Positive for malaise/fatigue.  Gastrointestinal: Positive for diarrhea.  Musculoskeletal: Positive for myalgias.  Neurological: Positive for weakness.  Psychiatric/Behavioral: Positive for depression and substance abuse. The patient is nervous/anxious and has insomnia.     Blood pressure 142/89, pulse 106, temperature 98.7 F (37.1 C), temperature source Oral, resp. rate 18, height '5\' 8"'  (1.727 m), weight 83.5 kg (184 lb 1.4 oz), SpO2 97.00%.Body mass index is 28 kg/(m^2).  General Appearance: Disheveled  Eye  Sport and exercise psychologist::  Fair  Speech:  Clear and Coherent and Slow  Volume:  Decreased  Mood:  Anxious and Depressed  Affect:  Constricted and Depressed  Thought Process:  Coherent and Goal Directed  Orientation:  Full (Time, Place, and Person)  Thought Content:  WDL  Suicidal Thoughts:  No  Homicidal Thoughts:  No  Memory:  Immediate;   Fair Recent;   Poor  Judgement:  Intact  Insight:  Fair  Psychomotor Activity:  Decreased  Concentration:  Fair  Recall:  [place] of Knowledge:Good  Language: Good  Akathisia:  NA  Handed:  Right  AIMS (if indicated):     Assets:  Communication Skills Desire for Improvement Financial Resources/Insurance Housing Intimacy Leisure Time Resilience Social Support Transportation  Sleep:      Musculoskeletal: Strength & Muscle Tone: decreased Gait & Station: unable to stand Patient leans: N/A  Treatment Plan Summary: Daily contact with patient to assess and evaluate symptoms and progress in treatment Medication management Start Cymbalta 30 mg PO QD for depression and continue Neurontin 300 mg TID for anxiety  Refer to out patient psychiatric services when medically stable  Doreather Hoxworth,JANARDHAHA R. 11/29/2013 12:29 PM

## 2013-11-29 NOTE — Consult Note (Signed)
[facility] CONSULT Reason for consult: Diarrhea Referring Physician: Family Practice Service. Primary G.I. patient unassigned  Juan [name] is an 63 y.o. male.  HPI: he has a long history of intermittent diarrhea dating back many years. History is obtained from him and his wife are having some disagreement about the chronic nature of his bowel movements. He is having loose stools alternating with solid stools. Acute episode has lasted for the past 3 weeks or so and has been associated with anorexia. He has a massive umbilical hernia containing only fat by CT scan. This is been evaluated in the past by surgery due to the size and his heart issues no surgery was performed. Recent CT scan did not show any bowel in this large hernia sac only mesenteric fat. The patient denies abdominal pain. He is having anorexia and loose watery stools. He was hospitalized one week ago with diarrhea and apparently was given Cipro on Flagyl with some improvement discharged home. According to he and his wife he was only home for 2 days before all of his symptoms recurred despite the use of Cipro on Flagyl. He came back with dehydration, slight lactic acidosis and was readmitted. He apparently has had a history of alcohol withdrawal in the past that has been moved to the cardiac step down for monitoring. He apparently is had withdrawal seizures. Patient and his wife notes that he fell it home after having a diarrheal stool in the bathroom. She called him and he did not hit his head or anything of this nature. The stool is brown and water. He has never had colonoscopy. Denies family history of colon cancer, polyps, or IBD. He is having 3 to 4 loose stools per day and not having any abdominal pain. During his last visit he had a positive stool lactoferrin, negative stool culture, and now his had 3 C. difficile studies that have been negative. The patient denies any bleeding. He has 3 cats, drinks bottled water, is not  traveled anywhere else. No one in his family's been sick with diarrhea other than him  Past Medical History  Diagnosis Date  . CHF (congestive heart failure) 10/2010    ECHO:  EF 40%, Grade II diastolic dysfunction  . Alcoholism   . Allergy     seasonal   . Anxiety   . Depression   . Asthma   . Hyperlipidemia   . Hypertension   . Seizures     Past Surgical History  Procedure Laterality Date  . Removal of ingrown toenail      Family History  Problem Relation Age of Onset  . Alzheimer's disease Mother   . Diabetes Mother   . Heart disease Maternal Uncle     Social History:  reports that he has been smoking Cigarettes.  He has a 40 pack-year smoking history. He has never used smokeless tobacco. He reports that he drinks alcohol. He reports that he does not use illicit drugs.  Allergies: No Known Allergies  Medications; Prior to Admission medications   Medication Sig Start Date End Date Taking? Authorizing Provider  acetaminophen (TYLENOL) 500 MG tablet Take 1,500 mg by mouth every 6 (six) hours as needed for headache (headache).    Yes Historical Provider, MD  aspirin EC 81 MG EC tablet Take 1 tablet (81 mg total) by mouth daily. 11/13/12  Yes Janece Canterbury, MD  atorvastatin (LIPITOR) 40 MG tablet Take 1 tablet (40 mg total) by mouth daily at 6 PM. Need appointment for  future refills. 11/02/13  Yes Pixie Casino, MD  B-Complex CAPS Take 1 capsule by mouth daily.   Yes Historical Provider, MD  bismuth subsalicylate (PEPTO BISMOL) 262 MG/15ML suspension Take 30 mLs by mouth every 6 (six) hours as needed for diarrhea or loose stools.   Yes Historical Provider, MD  carvedilol (COREG) 6.25 MG tablet Take 1 tablet (6.25 mg total) by mouth 2 (two) times daily with a meal. <please schedule appointment for future refills> 09/21/13  Yes Pixie Casino, MD  Cholecalciferol (VITAMIN D-3) 1000 UNITS CAPS Take 1,000 Units by mouth daily.   Yes Historical Provider, MD  ciprofloxacin  (CIPRO) 500 MG tablet Take 1 tablet (500 mg total) by mouth 2 (two) times daily. 11/19/13  Yes Donne Hazel, MD  clonazePAM (KLONOPIN) 0.5 MG tablet Take 0.5 mg by mouth 2 (two) times daily as needed for anxiety (anxiety).    Yes Historical Provider, MD  Ephedrine-Guaifenesin (PRIMATENE ASTHMA) 12.5-200 MG TABS Take 2 tablets by mouth 2 (two) times daily as needed (shortness of breath).    Yes Historical Provider, MD  EPINEPHrine HCl (ASTHMANEFRIN IN) Inhale 2 puffs into the lungs 3 (three) times daily as needed (Shortness of breath).   Yes Historical Provider, MD  fluticasone Asencion Islam) 50 MCG/ACT nasal spray Use two sprays in each nostril daily 11/08/13  Yes Alveda Reasons, MD  furosemide (LASIX) 40 MG tablet Take 1 tablet (40 mg total) by mouth daily. Need appointment for future refills. 11/02/13  Yes Pixie Casino, MD  gabapentin (NEURONTIN) 300 MG capsule Take 1 capsule (300 mg total) by mouth 3 (three) times daily. 06/17/13  Yes Hosie Poisson, MD  ipratropium (ATROVENT HFA) 17 MCG/ACT inhaler Inhale 2 puffs into the lungs 3 (three) times daily as needed for wheezing (wheezing).    Yes Historical Provider, MD  Ipratropium-Albuterol (COMBIVENT RESPIMAT) 20-100 MCG/ACT AERS respimat Inhale 2 puffs into the lungs every 6 (six) hours as needed for wheezing (wheezing).   Yes Historical Provider, MD  lisinopril (PRINIVIL,ZESTRIL) 20 MG tablet Take 20 mg by mouth daily.   Yes Historical Provider, MD  magnesium oxide (MAG-OX) 400 MG tablet Take 800 mg by mouth daily.   Yes Historical Provider, MD  metroNIDAZOLE (FLAGYL) 500 MG tablet Take 1 tablet (500 mg total) by mouth 3 (three) times daily. 11/19/13  Yes Donne Hazel, MD  montelukast (SINGULAIR) 10 MG tablet Take 1 tablet (10 mg total) by mouth at bedtime. 10/26/13  Yes Alveda Reasons, MD  Multiple Vitamin (MULTIVITAMIN WITH MINERALS) TABS tablet Take 1 tablet by mouth daily.   Yes Historical Provider, MD  potassium chloride SA (K-DUR,KLOR-CON) 20  MEQ tablet Take 2 tablets (40 mEq total) by mouth daily. 12/01/12  Yes Pixie Casino, MD  spironolactone (ALDACTONE) 25 MG tablet Take 0.5 tablets (12.5 mg total) by mouth daily. Need appointment for future refills. 11/02/13  Yes Pixie Casino, MD  tiotropium (SPIRIVA) 18 MCG inhalation capsule Place 1 capsule (18 mcg total) into inhaler and inhale daily. 06/17/13  Yes Hosie Poisson, MD  triamcinolone (KENALOG) 0.025 % ointment Apply 1 application topically 2 (two) times daily. 10/26/13  Yes Alveda Reasons, MD   . aspirin EC  81 mg Oral Daily  . atorvastatin  40 mg Oral q1800  . carvedilol  6.25 mg Oral BID WC  . DULoxetine  30 mg Oral Daily  . feeding supplement (RESOURCE BREEZE)  1 Container Oral TID BM  . fluocinonide ointment   Topical  BID  . [START ON 47/65/4650] folic acid  1 mg Oral Daily  . gabapentin  300 mg Oral TID  . heparin  5,000 Units Subcutaneous 3 times per day  . lisinopril  20 mg Oral Daily  . LORazepam  2 mg Intravenous Q6H  . montelukast  10 mg Oral QHS  . [START ON 11/30/2013] multivitamin with minerals  1 tablet Oral Daily  . spironolactone  12.5 mg Oral Daily  . [START ON 11/30/2013] thiamine  100 mg Oral Daily  . tiotropium  18 mcg Inhalation Daily   PRN Meds acetaminophen, acetaminophen, albuterol, LORazepam, ondansetron (ZOFRAN) IV, ondansetron Results for orders placed during the hospital encounter of 11/28/13 (from the past 48 hour(s))  CBC WITH DIFFERENTIAL     Status: Abnormal   Collection Time    11/28/13  3:38 PM      Result Value Ref Range   WBC 4.4  4.0 - 10.5 K/uL   RBC 3.96 (*) 4.22 - 5.81 MIL/uL   Hemoglobin 12.5 (*) 13.0 - 17.0 g/dL   HCT 35.8 (*) 39.0 - 52.0 %   MCV 90.4  78.0 - 100.0 fL   MCH 31.6  26.0 - 34.0 pg   MCHC 34.9  30.0 - 36.0 g/dL   RDW 14.1  11.5 - 15.5 %   Platelets 284  150 - 400 K/uL   Neutrophils Relative % 56  43 - 77 %   Neutro Abs 2.5  1.7 - 7.7 K/uL   Lymphocytes Relative 24  12 - 46 %   Lymphs Abs 1.1  0.7 -  4.0 K/uL   Monocytes Relative 18 (*) 3 - 12 %   Monocytes Absolute 0.8  0.1 - 1.0 K/uL   Eosinophils Relative 1  0 - 5 %   Eosinophils Absolute 0.1  0.0 - 0.7 K/uL   Basophils Relative 1  0 - 1 %   Basophils Absolute 0.1  0.0 - 0.1 K/uL  COMPREHENSIVE METABOLIC PANEL     Status: Abnormal   Collection Time    11/28/13  3:38 PM      Result Value Ref Range   Sodium 133 (*) 137 - 147 mEq/L   Potassium 4.1  3.7 - 5.3 mEq/L   Chloride 90 (*) 96 - 112 mEq/L   CO2 24  19 - 32 mEq/L   Glucose, Bld 144 (*) 70 - 99 mg/dL   BUN 12  6 - 23 mg/dL   Creatinine, Ser 0.83  0.50 - 1.35 mg/dL   Calcium 8.7  8.4 - 10.5 mg/dL   Total Protein 7.7  6.0 - 8.3 g/dL   Albumin 3.5  3.5 - 5.2 g/dL   AST 55 (*) 0 - 37 U/L   ALT 27  0 - 53 U/L   Alkaline Phosphatase 124 (*) 39 - 117 U/L   Total Bilirubin 0.3  0.3 - 1.2 mg/dL   GFR calc non Af Amer >90  >90 mL/min   GFR calc Af Amer >90  >90 mL/min   Comment: (NOTE)     The eGFR has been calculated using the CKD EPI equation.     This calculation has not been validated in all clinical situations.     eGFR's persistently <90 mL/min signify possible Chronic Kidney     Disease.   Anion gap 19 (*) 5 - 15  LIPASE, BLOOD     Status: None   Collection Time    11/28/13  3:38 PM  Result Value Ref Range   Lipase 25  11 - 59 U/L  I-STAT CG4 LACTIC ACID, ED     Status: Abnormal   Collection Time    11/28/13  5:30 PM      Result Value Ref Range   Lactic Acid, Venous 4.34 (*) 0.5 - 2.2 mmol/L  TROPONIN I     Status: None   Collection Time    11/28/13 11:55 PM      Result Value Ref Range   Troponin I <0.30  <0.30 ng/mL   Comment:            Due to the release kinetics of cTnI,     a negative result within the first hours     of the onset of symptoms does not rule out     myocardial infarction with certainty.     If myocardial infarction is still suspected,     repeat the test at appropriate intervals.  LACTIC ACID, PLASMA     Status: Abnormal    Collection Time    11/28/13 11:58 PM      Result Value Ref Range   Lactic Acid, Venous 4.4 (*) 0.5 - 2.2 mmol/L  CLOSTRIDIUM DIFFICILE BY PCR     Status: None   Collection Time    11/29/13  4:55 AM      Result Value Ref Range   C difficile by pcr NEGATIVE  NEGATIVE  COMPREHENSIVE METABOLIC PANEL     Status: Abnormal   Collection Time    11/29/13  5:15 AM      Result Value Ref Range   Sodium 129 (*) 137 - 147 mEq/L   Potassium 3.8  3.7 - 5.3 mEq/L   Chloride 91 (*) 96 - 112 mEq/L   CO2 21  19 - 32 mEq/L   Glucose, Bld 119 (*) 70 - 99 mg/dL   BUN 10  6 - 23 mg/dL   Creatinine, Ser 0.88  0.50 - 1.35 mg/dL   Calcium 8.1 (*) 8.4 - 10.5 mg/dL   Total Protein 6.5  6.0 - 8.3 g/dL   Albumin 3.0 (*) 3.5 - 5.2 g/dL   AST 60 (*) 0 - 37 U/L   ALT 24  0 - 53 U/L   Alkaline Phosphatase 105  39 - 117 U/L   Total Bilirubin 0.8  0.3 - 1.2 mg/dL   GFR calc non Af Amer >90  >90 mL/min   GFR calc Af Amer >90  >90 mL/min   Comment: (NOTE)     The eGFR has been calculated using the CKD EPI equation.     This calculation has not been validated in all clinical situations.     eGFR's persistently <90 mL/min signify possible Chronic Kidney     Disease.   Anion gap 17 (*) 5 - 15  CBC     Status: Abnormal   Collection Time    11/29/13  5:15 AM      Result Value Ref Range   WBC 8.1  4.0 - 10.5 K/uL   RBC 3.14 (*) 4.22 - 5.81 MIL/uL   Hemoglobin 10.1 (*) 13.0 - 17.0 g/dL   Comment: REPEATED TO VERIFY   HCT 29.2 (*) 39.0 - 52.0 %   MCV 93.0  78.0 - 100.0 fL   MCH 32.2  26.0 - 34.0 pg   MCHC 34.6  30.0 - 36.0 g/dL   RDW 14.5  11.5 - 15.5 %   Platelets 252  150 - 400  K/uL  PROTIME-INR     Status: None   Collection Time    11/29/13  5:15 AM      Result Value Ref Range   Prothrombin Time 13.6  11.6 - 15.2 seconds   INR 1.03  0.00 - 1.49  MAGNESIUM     Status: None   Collection Time    11/29/13  5:15 AM      Result Value Ref Range   Magnesium 1.5  1.5 - 2.5 mg/dL  PHOSPHORUS     Status: None    Collection Time    11/29/13  5:15 AM      Result Value Ref Range   Phosphorus 2.9  2.3 - 4.6 mg/dL  TROPONIN I     Status: None   Collection Time    11/29/13  5:15 AM      Result Value Ref Range   Troponin I <0.30  <0.30 ng/mL   Comment:            Due to the release kinetics of cTnI,     a negative result within the first hours     of the onset of symptoms does not rule out     myocardial infarction with certainty.     If myocardial infarction is still suspected,     repeat the test at appropriate intervals.  LACTIC ACID, PLASMA     Status: Abnormal   Collection Time    11/29/13  7:02 AM      Result Value Ref Range   Lactic Acid, Venous 3.2 (*) 0.5 - 2.2 mmol/L  TROPONIN I     Status: None   Collection Time    11/29/13 10:50 AM      Result Value Ref Range   Troponin I <0.30  <0.30 ng/mL   Comment:            Due to the release kinetics of cTnI,     a negative result within the first hours     of the onset of symptoms does not rule out     myocardial infarction with certainty.     If myocardial infarction is still suspected,     repeat the test at appropriate intervals.    Dg Chest 2 View  11/29/2013   CLINICAL DATA:  Shortness of breath, productive cough.  EXAM: CHEST  2 VIEW  COMPARISON:  11/18/2013, 06/14/2013 CT  FINDINGS: Areas of interstitial coarsening and patchy airspace opacities. No pleural effusion or pneumothorax. Cardiomediastinal contours within normal range. Hyperinflation with increased AP diameter. Multilevel degenerative changes. Diffuse osteopenia. Upper lumbar compression fracture again noted.  IMPRESSION: Interstitial coarsening and mild scattered patchy airspace opacities, similar to priors.   Electronically Signed   By: Carlos Levering M.D.   On: 11/29/2013 04:12               Blood pressure 139/82, pulse 108, temperature 98.4 F (36.9 C), temperature source Oral, resp. rate 20, height 5' 8" (1.727 m), weight 83.5 kg (184 lb 1.4 oz), SpO2  99.00%.  Physical exam:   General--anxious white male who was alert and seems to be somewhat agitated Heart-- regular rate and rhythm without murmurs or gallops Lungs--clear Abdomen-- normal bowel sounds and nontender   Assessment: 1. Diarrhea. This appears to be both chronic and acute. It seems to come and go. He probably should have colonoscopy and biopsy given the positive lactoferrin. The patient refuses colonoscopy at this time. 2. Alcohol dependence with concerns for withdrawal  symptoms   Plan: we will follow with you. He may consent colonoscopy in the future. We will go ahead and obtain fecal elastase.   Hagan Maltz JR,Shun L 11/29/2013, 4:06 PM

## 2013-11-29 NOTE — Progress Notes (Signed)
INITIAL NUTRITION ASSESSMENT  Pt meets criteria for SEVERE MALNUTRITION in the context of acute illness or injury as evidenced by a 2.12% weight loss in one week and energy intake </= 50% for >/= 5 days.  DOCUMENTATION CODES Per approved criteria  -Severe malnutrition in the context of acute illness or injury   INTERVENTION: Provide Resource Breeze po TID, each supplement provides 250 kcal and 9 grams of protein.  NUTRITION DIAGNOSIS: Increased nutrient needs related to chronic illness, CHF as evidenced by estimated nutrition needs.   Goal: Pt to meet >/= 90% of their estimated nutrition needs   Monitor:  PO intake, weight trends, labs, I/O's  Reason for Assessment: MST  63 y.o. male  Admitting Dx: Diarrhea  ASSESSMENT: Pt presenting with diarrhea. PMH is significant for alcoholism, HTN, diastolic HF, hyponatremia, seizures 2/2 alcohol withdrawal. Pt on CIWA protocol.   Meal completion is 100%. Pt reports appetite has been improving, however PTA pt reports having a decreased appetite over the past 2 weeks. Pt would eat on daily 1 meal a day that contained one bowl of soup. Prior to his decrease appetite pt reports eating well with 3 full meals a day and no other difficulties. Pt reports his usual body weight of 188 lbs. Pt with a 2.12% weight loss in 1 week. Pt is agreeable to resource breeze. Will order.   Pt with continued diarrhea.   Pt with no observed significant fat or muscle mass loss.  Labs: Low sodium, chloride, calcium. High AST.  Height: Ht Readings from Last 1 Encounters:  11/28/13 5\' 8"  (1.727 m)    Weight: Wt Readings from Last 1 Encounters:  11/28/13 184 lb 1.4 oz (83.5 kg)    Ideal Body Weight: 154 lbs  % Ideal Body Weight: 119%  Wt Readings from Last 10 Encounters:  11/28/13 184 lb 1.4 oz (83.5 kg)  11/19/13 188 lb 0.8 oz (85.3 kg)  10/26/13 188 lb 12.8 oz (85.639 kg)  08/03/13 187 lb 3.2 oz (84.913 kg)  06/29/13 179 lb 3.2 oz (81.285 kg)   06/17/13 188 lb 6.4 oz (85.458 kg)  11/26/12 176 lb (79.833 kg)  11/24/12 178 lb 4.8 oz (80.876 kg)  11/13/12 175 lb 7.8 oz (79.6 kg)  08/06/12 179 lb (81.194 kg)    Usual Body Weight: 188 lbs  % Usual Body Weight: 98%  BMI:  Body mass index is 28 kg/(m^2).  Estimated Nutritional Needs: Kcal: 2000-2200  Protein: 95-105g  Fluid: 2-2.2 L/day  Skin: intact  Diet Order: Clear Liquid  EDUCATION NEEDS: -No education needs identified at this time   Intake/Output Summary (Last 24 hours) at 11/29/13 0946 Last data filed at 11/28/13 2015  Gross per 24 hour  Intake   1000 ml  Output      0 ml  Net   1000 ml    Last BM: 10/26-diarrhea  Labs:   Recent Labs Lab 11/28/13 1538 11/29/13 0515  NA 133* 129*  K 4.1 3.8  CL 90* 91*  CO2 24 21  BUN 12 10  CREATININE 0.83 0.88  CALCIUM 8.7 8.1*  MG  --  1.5  PHOS  --  2.9  GLUCOSE 144* 119*    CBG (last 3)  No results found for this basename: GLUCAP,  in the last 72 hours  Scheduled Meds: . aspirin EC  81 mg Oral Daily  . atorvastatin  40 mg Oral q1800  . carvedilol  6.25 mg Oral BID WC  . fluocinonide ointment  Topical BID  . folic acid  1 mg Oral Daily  . gabapentin  300 mg Oral TID  . heparin  5,000 Units Subcutaneous 3 times per day  . lisinopril  20 mg Oral Daily  . LORazepam  0-4 mg Intravenous Q6H   Followed by  . [START ON 11/30/2013] LORazepam  0-4 mg Intravenous Q12H  . montelukast  10 mg Oral QHS  . multivitamin with minerals  1 tablet Oral Daily  . spironolactone  12.5 mg Oral Daily  . thiamine  100 mg Oral Daily   Or  . thiamine  100 mg Intravenous Daily  . tiotropium  18 mcg Inhalation Daily    Continuous Infusions: . sodium chloride 100 mL/hr at 11/29/13 2863    Past Medical History  Diagnosis Date  . CHF (congestive heart failure) 10/2010    ECHO:  EF 40%, Grade II diastolic dysfunction  . Alcoholism   . Allergy     seasonal   . Anxiety   . Depression   . Asthma   .  Hyperlipidemia   . Hypertension   . Seizures     Past Surgical History  Procedure Laterality Date  . Removal of ingrown toenail      Marijean Niemann, MS, RD, LDN Pager # [phone] After hours/ weekend pager # [phone]

## 2013-11-29 NOTE — H&P (Signed)
I have seen and examined this patient. I have discussed with Dr Adriana Simas.  I agree with their findings and plans as documented in their admission note.  Patient and his wife and EMR were sources of information for the visit.   Acute Issues 1. Persistent, painless, non-bloody diarrhea - Onset 3 weeks ago. - Frequency of diarrhea 3 to 5 a day - Decreased appetite. Weight down 4 pounds from 10/26/13. - No prior GI workup- no history of colonoscopy - Hospitalization 10/15 for colitis (on CT) treated with cipro/flgyl with only transient improvement in diarrhea. HIV negative 11/18/13.  - History of loose, watery, uncontrollable bowel movements over last several years, occuring about once a month.  Pt is fearful of being away from home for long because of the possibility of soiling himself.  - History of "Irritable bowels" as teenager, but not since - Patient in active alcohol dependence. - Pt looking towards wife for assistance with answers to my questions. - MiniCog test: 1 out of three recall of objects and failed clock draw. 2. Alcohol Dependence, active.  3. Lactic Acidosis - Suspect this is elevation is due to the decrease in lactate utilization in Alcoholism. Do not think it is result of increased lactic acid production.     Recommendations - Given the possible colitis on CT 10/15, failure of diarrhea to respond to empiric antibiotic therapy, and severity of diarrhea to degree requiring hospitalization it seems reasonable to consult with GI for evaluation and treatment recommendations.   Unfortunately, pt said he would not consent to an endoscopic evaluation. He was unable to give a reason other than he preferred not to.  He said he would be willing to consult with GI for their recommendations.  - Given failed MiniCog testing in setting of no overt findings for delirium, cognitive impairment is possible.  I am concerned that the patient is not able to participate in an informed consent process.     Recommendation - Follow up C diff PCR, if negative, trial of Imodium schedules.  - Stool samples send for hemoccult, lactoferrin,, culture - Check Sedimentation rate and TSH - consider Consultation with Psychiatry for their opinion as to whether patient has capacity for informed consent around diagnostic colonoscopy.  Consider cosnultation with GI as inpatient or outpatient.

## 2013-11-29 NOTE — Progress Notes (Signed)
Pt orientation to unit, room and routine. Information packet given to patient and safety video watched.  Admission INP armband ID verified with patient and in place. Side rails in place, fall risk assessment complete with patient verbalizing understanding of risks associated with falls. Pt verbalizes an understanding of how to use the call bell and to call for help before getting out of bed.   Will cont to monitor and assist as needed.  Gilman Schmidt, RN

## 2013-11-29 NOTE — Discharge Summary (Signed)
Juan [name]  Patient name: Juan [name] Medical record number: [id number] Date of birth: 06/03/1950 Age: 63 y.o. Gender: male Date of Admission: 11/28/2013  Date of Discharge: 12/01/13 Admitting Physician: Juan Ohara McDiarmid, MD  Primary Care Provider: Annabell Sabal, MD Consultants: GI, Psych  Indication for Hospitalization: diarrhea, lactic acidosis  Discharge Diagnoses/Problem List:  Dehydration 2/2 diarrhea Lactic acidosis Depression/anxiety Hyponatremia Hypokalemia Alcohol abuse with withdrawal Normocytic anemia Diastolic heart failure Umbilical hernia HTN COPD Psoriasis  Disposition: Home  Discharge Condition: Stable  Discharge Exam: See progress note from day of discharge  Brief Hospital Course: Juan [name] is a 63 y.o. male presenting with diarrhea. PMH is significant for alcoholism, HTN, diastolic HF, hyponatremia, seizures 2/2 alcohol withdrawal.   #Dehydration and generalized weakness in setting of Diarrhea - Patient was recently admitted to [facility] (10/15) for colitis and was treated with cipro/flagyl x7d. Diarrhea again worsened at time of admission. Patient had negative workup during admission at [facility] - including C diff, O&P, and stool culture. CT abd/pelvis (10/15) with chronic colitis without obstruction. Lactic acid elevated to 4.34 on admission. No leukocytosis or fever. C diff PCR negative again and TSH wnl.  GI was consulted, recommended colonoscopy (which patient expressed interest in as an outpatient, rather than during admission), ordered fecal elastase to evaluate for pancreatic insufficiency in setting of alcohol abuse (still pending at time of discharge), and started patient on Bentyl for antispasmodic properties.  Etiology of diarrhea is unclear at this time.  Diarrhea improved before discharge.  PT was consulted on admission and treated throughout hospitalization - recommended 24h supervision on discharge  (wife able to provide).  #Lactic acidosis: Patient presenting with lactic acidosis in setting of diarrhea. Lactic acid 4.34 with anion gap of 19 on admission.  Thought to be likely 2/2 volume depletion/poor perfusion related to diarrhea. Resolved with IVF (lactic acid downtrended to 1.2 and anion gap closed to 14) prior to discharge.    # Psych: Patient noted to be looking to his wife for answers to questions and was refusing colonoscopy after admission.  He failed mini-cog evaluation on 10/27, so psych was consulted for a formal capacity evaluation.  Psych saw him and deemed that he did have capacity to make decisions.  His mental status cleared afterwards, suggesting that acute delirium in setting of alcohol withdrawal could have been playing a role.  Psychiatry started patient on Cymblata 13m daily (increased to 435mdaily on discharge) for depression and Neurontin 30066mID for anxiety and neuropathic pain.  He was given resources for substance abuse (see below) and [facility]  #Hyponatremia: Na ranged from 129-133 during admission, which seems to be around his baseline. Likely related to chronic alcohol abuse.  He was [facility] initially and [facility]  #Hypokalemia: Potassium low at 3.2 during admission. Was repleted with oral KDur 58m14m2 during admission.  Should be rechecked at outpatient follow-up.  #Alcohol Abuse: Patient has h/o alcohol withdrawal seizures. AST elevated in a pattern consistent with chronic alcohol abuse on admission. Noted to be anxious, agitated, tachycardic, and tremulous in ED on admission.  Was managed per CIWA protocol (initially on med-surg floor and then transferred to SDU for closer monitoring).  Was also placed on scheduled ativan, as appeared to be acitvely withdrawaling - initially on 41m q20m then spaced to 41mg T30m then decreased to 1mg TI15mn day of discharge.  Before  discharge  on 10/28, CIWA scores 0 O/N, dose of ativan was decreased as above, and patient transferred back to telemetry unit and monitored before discharge.  SW was consulted on admission and provided patient with resources for outpatient rehab programs.  Patient expressed interest in pursuing rehab after discharge and was discharged with 3 day supply of Ativan 60m TID to last until established with rehab program as outpatient.  #Normocytic Anemia: Hemoglobin anywhere from 9-12 in recent past. Hgb as low as 9.8 during admission. Likely related to alcohol abuse chronically, and a dilutional effect on IVF during hospitalization.  Was monitored closely during admission and was stable at discharge.   #Diastolic heart failure: Echo in 09/2013 with EF 635-57% grade 1 diastolic dysfunction.  Patient appeared dehydrated on admission as above.  Tolerated IVF well and was euvolemic throughout remainder of admission.  CXR without volume overload. Home Lasix was held throughout admission, though remainder of home meds were continued (can consider stopping spironolactone as outpatient as patient has pEF and no evidence of cirrhosis yet).  Troponins neg x3 and EKGs unchanged and non-ischemic.  #Umbilical hernia - Large hernia noted on admission exam.  Shown to be fat containing on recent CT abd/pelvis (10/15) and no signs of incarceration during hospitalization.   #COPD: Noted diffuse wheezing on admission.  Patient with normal WOB and satting well on RA. CXR clear. Patient continued on home spiriva and singulair and given albuterol nebs q6h prn with improvement in wheezing.  Stable at time of discharge.    #Psoriasis: Patient presenting with severe psoriatic rash over b/l LEs, mildly on UEs, and over umbilical hernia. On low potency Triamancinolone cream at home.  Was started on Lidex ointment BID during admission.  All other chronic medical conditions were stable throughout admission and managed with home  regimens.  Issues for Follow Up:  1. F/u K - 3.6 on day of discharge 2. F/u fecal elastase - pending at time of discharge 3. Patient will need outpatient colonoscopy 4. Patient started on bentyl, cymbalta (note this could worsen diarrhea), and Neurontin during admission 5. F/u alcohol withdrawal - discharged with enough Ativan to last through starting rehab program. 6. Wondered why patient is taking Singulair with COPD during admission.  Re-evaluate indication of this medication at f/u. 7. Consider d/c'ing Spironolactone as outpatient as patient has HFpEF and no evidence of cirrhosis yet.  Significant Procedures: None  Significant Labs and Imaging:   Recent Labs Lab 11/29/13 0515 11/30/13 0330 12/01/13 0309  WBC 8.1 3.5* 4.4  HGB 10.1* 9.8* 9.0*  HCT 29.2* 28.7* 26.7*  PLT 252 194 177    Recent Labs Lab 11/28/13 1538 11/29/13 0515 11/30/13 0330 12/01/13 0309  NA 133* 129* 133* 131*  K 4.1 3.8 3.3* 3.6*  CL 90* 91* 98 95*  CO2 '24 21 21 24  ' GLUCOSE 144* 119* 95 98  BUN 12 10 4* <3*  CREATININE 0.83 0.88 0.81 0.71  CALCIUM 8.7 8.1* 8.2* 8.0*  MG  --  1.5  --   --   PHOS  --  2.9  --   --   ALKPHOS 124* 105  --   --   AST 55* 60*  --   --   ALT 27 24  --   --   ALBUMIN 3.5 3.0*  --   --    Lactic acid 4.34 > 4.4 > 3.2 > 1.2   TSH (10/27): 1.710   C Diff PCR (10/26): negative   ESR (10/27): 15  Imaging/Diagnostic Tests:  EKG (10/25): Sinus tachycardia, RBBB, non-ischemic   CXR (10/26): Interstitial coarsening and mild scattered patchy airspace  opacities, similar to priors.  Results/Tests Pending at Time of Discharge: Fecal elastase  Discharge Medications:    Medication List    STOP taking these medications       ciprofloxacin 500 MG tablet  Commonly known as:  CIPRO     magnesium oxide 400 MG tablet  Commonly known as:  MAG-OX     metroNIDAZOLE 500 MG tablet  Commonly known as:  FLAGYL      TAKE these medications       acetaminophen 500 MG  tablet  Commonly known as:  TYLENOL  Take 1,500 mg by mouth every 6 (six) hours as needed for headache (headache).     aspirin 81 MG EC tablet  Take 1 tablet (81 mg total) by mouth daily.     ASTHMANEFRIN IN  Inhale 2 puffs into the lungs 3 (three) times daily as needed (Shortness of breath).     atorvastatin 40 MG tablet  Commonly known as:  LIPITOR  Take 1 tablet (40 mg total) by mouth daily at 6 PM. Need appointment for future refills.     B-Complex Caps  Take 1 capsule by mouth daily.     bismuth subsalicylate 373 SK/87GO suspension  Commonly known as:  PEPTO BISMOL  Take 30 mLs by mouth every 6 (six) hours as needed for diarrhea or loose stools.     carvedilol 6.25 MG tablet  Commonly known as:  COREG  Take 1 tablet (6.25 mg total) by mouth 2 (two) times daily with a meal. <please schedule appointment for future refills>     clonazePAM 0.5 MG tablet  Commonly known as:  KLONOPIN  Take 1 tablet (0.5 mg total) by mouth 2 (two) times daily as needed for anxiety (anxiety).     COMBIVENT RESPIMAT 20-100 MCG/ACT Aers respimat  Generic drug:  Ipratropium-Albuterol  Inhale 2 puffs into the lungs every 6 (six) hours as needed for wheezing (wheezing).     dicyclomine 20 MG tablet  Commonly known as:  BENTYL  Take 1 tablet (20 mg total) by mouth 3 (three) times daily before meals.     DULoxetine HCl 40 MG Cpep  Take 40 mg by mouth daily.  Start taking on:  12/02/2013     feeding supplement (RESOURCE BREEZE) Liqd  Take 1 Container by mouth 3 (three) times daily between meals.     fluticasone 50 MCG/ACT nasal spray  Commonly known as:  FLONASE  Use two sprays in each nostril daily     furosemide 40 MG tablet  Commonly known as:  LASIX  Take 1 tablet (40 mg total) by mouth daily. Need appointment for future refills.     gabapentin 300 MG capsule  Commonly known as:  NEURONTIN  Take 1 capsule (300 mg total) by mouth 3 (three) times daily.     ipratropium 17 MCG/ACT  inhaler  Commonly known as:  ATROVENT HFA  Inhale 2 puffs into the lungs 3 (three) times daily as needed for wheezing (wheezing).     lisinopril 20 MG tablet  Commonly known as:  PRINIVIL,ZESTRIL  Take 20 mg by mouth daily.     LORazepam 1 MG tablet  Commonly known as:  ATIVAN  Take 1 tablet (1 mg total) by mouth every 8 (eight) hours.     montelukast 10 MG tablet  Commonly known as:  SINGULAIR  Take 1  tablet (10 mg total) by mouth at bedtime.     multivitamin with minerals Tabs tablet  Take 1 tablet by mouth daily.     potassium chloride SA 20 MEQ tablet  Commonly known as:  K-DUR,KLOR-CON  Take 2 tablets (40 mEq total) by mouth daily.     PRIMATENE ASTHMA 12.5-200 MG Tabs  Generic drug:  Ephedrine-Guaifenesin  Take 2 tablets by mouth 2 (two) times daily as needed (shortness of breath).     spironolactone 25 MG tablet  Commonly known as:  ALDACTONE  Take 0.5 tablets (12.5 mg total) by mouth daily. Need appointment for future refills.     tiotropium 18 MCG inhalation capsule  Commonly known as:  SPIRIVA  Place 1 capsule (18 mcg total) into inhaler and inhale daily.     triamcinolone 0.025 % ointment  Commonly known as:  KENALOG  Apply 1 application topically 2 (two) times daily.     Vitamin D-3 1000 UNITS Caps  Take 1,000 Units by mouth daily.        Discharge Instructions: Please refer to Patient Instructions section of EMR for full details.  Patient was counseled important signs and symptoms that should prompt return to medical care, changes in medications, dietary instructions, activity restrictions, and follow up appointments.   Follow-Up Appointments: Follow-up Information   Schedule an appointment as soon as possible for a visit with Juan Sabal, MD. [facility] follow-up)    Specialty:  Family Medicine   Contact information:   [place] [address] [phone]       Lavon Paganini, MD 12/01/2013, 11:24 PM PGY-1, [place]

## 2013-11-29 NOTE — Progress Notes (Signed)
Patient transferred to 2c as ordered , report given to [facility].

## 2013-11-29 NOTE — Evaluation (Signed)
Physical Therapy Evaluation Patient Details Name: Juan [name] Juan [name] [name] MRN: [id number] DOB: Feb 01, 1951 Today's Date: Juan [name] [name]   History of Present Illness  Juan [name] Juan [name] [name] is Juan [name] [name] 63 y.o. male presenting with diarrhea; dx with colitis at [facility] 10/15. PMH is significant for alcoholism, HTN, diastolic HF, hyponatremia, seizures 2/2 alcohol withdrawal   Clinical Impression  Patient presents with functional limitations due to deficits listed in PT problem list (see below). Pt with balance deficits affecting safe mobility. Education provided to use [facility] for support during gait until rubber tip can be obtained for walking stick due to safety concerns without rubber tip. Pt agreeable.  Pt refusing to use RW. Pt may benefit from acute PT while in hospital to improve gait, balance and overall safe mobility so pt can maximize independence and decrease fall risk. If pt does not d/c to ETOH rehab, may benefit from follow up HHPT vs ST SNF.    Follow Up Recommendations Supervision/Assistance - 24 hour (Pt to go to ETOH rehab at d/c.)    Equipment Recommendations  None recommended by PT (Pt owns cane and refusing RW.)    Recommendations for Other Services       Precautions / Restrictions Precautions Precautions: Fall Restrictions Weight Bearing Restrictions: No      Mobility  Bed Mobility               General bed mobility comments: Pt in recliner upon arrival.  Transfers Overall transfer level: Needs assistance Equipment used:  (walking pole.) Transfers: Sit to/from Stand Sit to Stand: Min guard         General transfer comment: Min guard for safety.  Ambulation/Gait Ambulation/Gait assistance: Min guard Ambulation Distance (Feet): 150 Feet Assistive device:  (walking stick.) Gait Pattern/deviations: Step-through pattern;Decreased stride length;Drifts right/left;Trunk flexed;Scissoring Gait velocity: slow   General Gait Details: Pt with unsteady gait pattern with drifting noted  in Bil directions and scissoring at times however able to prevent actual LOB. Pole slipping on hospital floor.  Stairs            Wheelchair Mobility    Modified Rankin (Stroke Patients Only)       Balance Overall balance assessment: History of Falls;Needs assistance Sitting-balance support: Feet supported;No upper extremity supported Sitting balance-Leahy Scale: Good     Standing balance support: Single extremity supported Standing balance-Leahy Scale: Fair Standing balance comment: Requires UE support for balance/safety. Drifting and staggering noted during gait training even with use of walking stick.                             Pertinent Vitals/Pain Pain Assessment: No/denies pain    Home Living Family/patient expects to be discharged to:: Private residence Living Arrangements: Spouse/significant other Available Help at Discharge: Family;Available PRN/intermittently Type of Home: House Home Access: Stairs to enter Entrance Stairs-Rails: Right Entrance Stairs-Number of Steps: 1 Home Layout: One level Home Equipment: Other (comment);Cane - single point (walking pole) Additional Comments: Walking pole/stick is not safe due to not having Juan [name] [name] slid tip on bottom.    Prior Function Level of Independence: Independent with assistive device(s)         Comments: Pt drives.     Hand Dominance   Dominant Hand: Right    Extremity/Trunk Assessment   Upper Extremity Assessment: Defer to OT evaluation           Lower Extremity Assessment: Overall WFL for tasks assessed;RLE deficits/detail;LLE deficits/detail RLE Deficits / Details: Diminished  light touch sensation infoot. LLE Deficits / Details: Diminished light touch sensation infoot.     Communication   Communication: No difficulties  Cognition Arousal/Alertness: Awake/alert Behavior During Therapy: WFL for tasks assessed/performed Overall Cognitive Status: Within Functional Limits for tasks  assessed (Except questionable safety awareness as pt refuses to use any other AD despite having balance deficits.)                      General Comments General comments (skin integrity, edema, etc.): Pt reports 6 falls in the last 6 months - most due to Na imbalance and alcohol consumption. Discussed safety concerns when using walking stick without rubber tip. Pt to send wife to store to try to find one to put on stick.    Exercises        Assessment/Plan    PT Assessment Patient needs continued PT services  PT Diagnosis Difficulty walking   PT Problem List Decreased balance;Decreased safety awareness;Decreased activity tolerance;Decreased knowledge of use of DME;Impaired sensation  PT Treatment Interventions Balance training;Gait training;DME instruction;Functional mobility training;Patient/family education;Therapeutic activities   PT Goals (Current goals can be found in the Care Plan section) Acute Rehab PT Goals Patient Stated Goal: to ETOH rehab PT Goal Formulation: With patient Time For Goal Achievement: 12/13/13 Potential to Achieve Goals: Good    Frequency Min 3X/week   Barriers to discharge Decreased caregiver support Pt home alone during the day as the wife works.    Co-evaluation               End of Session Equipment Utilized During Treatment: Gait belt Activity Tolerance: Patient tolerated treatment well Patient left: in chair;with call bell/phone within reach;with family/visitor present;with nursing/sitter in room Nurse Communication: Mobility status;Precautions         Time: [id number] PT Time Calculation (min): 19 min   Charges:   PT Evaluation $Initial PT Evaluation Tier I: 1 Procedure PT Treatments $Gait Training: 8-22 mins   PT G CodesAlvie Juan [name] [name]:          Juan [name] [name], Juan [name] Juan [name] [name] Juan [name] Juan [name] [name] [place] Juan [name] [name], PT, DPT [phone]

## 2013-11-29 NOTE — Progress Notes (Signed)
Family Medicine Teaching Service Daily Progress Note Intern Pager: [phone]  Patient name: Juan [name] Medical record number: [id number] Date of birth: 03/03/1950 Age: 63 y.o. Gender: male  Primary Care Provider: Renold DonWALDEN,JEFF, MD Consultants: None  Code Status: full code  Pt Overview and Major Events to Date:  10/25 - admit to FPTS for diarrhea, lactic acidosis  Assessment and Plan:  Juan BernheimJames K Cirrincione is a 63 y.o. male presenting with diarrhea. PMH is significant for alcoholism, HTN, diastolic HF, hyponatremia, seizures 2/2 alcohol withdrawal.   #Dehydration 2/2 Diarrhea - [facility], treated with cipro/flagyl x7d. Diarrhea has again worsened. Negative C diff, O&P, [facility]. Recent CT abd/pelvis (10/15) with chronic colitis without obstruction. Lactic acid elevated to 4.34 on admission. No leukocytosis or fever. Still tachycardic. Unclear etiology at this time.  - f/u C Diff PCR  - NS @ 14000mL/hr and 1L bolus now - PT/OT consult for weakness - Will consult GI today   #Lactic acidosis: Likely 2/2 volume depletion/poor perfusion. Lactic acid 4.34, AG 19>17.  - IVF as above  - Continue to monitor   # Failed Mini-cog: Patient may not have capacity for informed consent currently. - Will consult Psych for a formal capacity and cognitive eval.  #Diastolic heart failure: Echo in 09/2013 with EF 60-65%, grade 1 diastolic dysfunction. Appear euvolemic on exam. CXR without volume overload. - Monitor fluid status carefully  - Daily weights and strict Is&Os   - Holding home Lasix. Could likely stop Spironolactone (pEF, no evidence of cirrhosis yet) - Troponins neg x2  #Hyponatremia: Na 133 >129 (seems to be around baseline). Likely related to chronic alcohol abuse.  - Continue to monitor  - IVF as above   #Alcohol Abuse: patient has h/o alcohol withdrawal seizures. AST elevated. CIWA 5-17 O/N (received 5mg  of ativan O/N).  Tachycardic O/N, could be  related to withdrawal. - Monitor on CIWA protocol with scheduled ativan  - SW consult for substance abuse  - [facility] better monitoring of withdrawal symptoms.  #Umbilical hernia - shown to be fat containing on recent CT abd/pelvis (10/15). No signs of incareration currently. - Consider consult to gen surgery - Continue to monitor   #HTN: BP mildly elevated O/N but most recent BP 115/97.  - Continue home Spironolactone (though could likely d/c), Coreg, Lisinopril  - Continue to monitor   #COPD: Diffusely wheezing on exam. Satting well on RA. CXR clear. - Continue to monitor respiratory status  - Continue home spiriva, singulair  - Albuterol nebs q6h prn   #Psoriasis: On Triamancinolone at home  - Lidex ointment BID   FEN/GI: Clear liquids, NS @ 15100mL/hr  Prophylaxis: Sub q heparin  Disposition: Pending improvement in symptoms  Subjective:  Patient reports only 2 episodes of diarrhea overnight (2nd after drinking coffee).  Looks to his wife for the answers to questions.  Thinks he may have missed a dose of ativan and is more tremulous now.  Objective: Temp:  [97.6 F (36.4 C)-98.7 F (37.1 C)] 98.7 F (37.1 C) (10/26 1054) Pulse Rate:  [106-135] 106 (10/26 1054) Resp:  [12-24] 18 (10/26 1054) BP: (115-164)/(74-99) 142/89 mmHg (10/26 1054) SpO2:  [94 %-98 %] 96 % (10/26 1054) Weight:  [184 lb 1.4 oz (83.5 kg)-188 lb (85.276 kg)] 184 lb 1.4 oz (83.5 kg) (10/25 2245) Physical Exam: General: Sitting in bed in NAD, mildly tremulous  HEENT: NCAT, PERRL, EOMI Cardiovascular: RRR, no m/r/g  Respiratory: Mild wheezing  in all lung fields, no crackles/rhonchi  Abdomen: Soft, NDNT, no hepatomegaly, +BS. Umbilical hernia without BS, darkened skin overlying.  Extremities: No edema  Skin: Psoriatic rash covering much of anterior LEs, less severe on UEs, overlying umbilical hernia in addition. Some excoriations around rash.  Neuro: CN 2-12 grossly intact. A&Ox3, no  focal deficits.  Failed mini-cog.   Laboratory:  Recent Labs Lab 11/28/13 1538 11/29/13 0515  WBC 4.4 8.1  HGB 12.5* 10.1*  HCT 35.8* 29.2*  PLT 284 252    Recent Labs Lab 11/28/13 1538 11/29/13 0515  NA 133* 129*  K 4.1 3.8  CL 90* 91*  CO2 24 21  BUN 12 10  CREATININE 0.83 0.88  CALCIUM 8.7 8.1*  PROT 7.7 6.5  BILITOT 0.3 0.8  ALKPHOS 124* 105  ALT 27 24  AST 55* 60*  GLUCOSE 144* 119*    Lactic acid 4.34 > 4.4  Imaging/Diagnostic Tests: EKG (10/25): Sinus tachycardia, RBBB, non-ischemic  CXR (10/26): Interstitial coarsening and mild scattered patchy airspace  opacities, similar to priors.   Shirlee Latch, MD 11/29/2013, 11:28 AM PGY-1, [facility] Family Medicine FPTS Intern pager: [phone], text pages welcome

## 2013-11-30 DIAGNOSIS — F329 Major depressive disorder, single episode, unspecified: Secondary | ICD-10-CM

## 2013-11-30 DIAGNOSIS — E871 Hypo-osmolality and hyponatremia: Secondary | ICD-10-CM

## 2013-11-30 DIAGNOSIS — I10 Essential (primary) hypertension: Secondary | ICD-10-CM

## 2013-11-30 DIAGNOSIS — R197 Diarrhea, unspecified: Secondary | ICD-10-CM

## 2013-11-30 DIAGNOSIS — F1029 Alcohol dependence with unspecified alcohol-induced disorder: Secondary | ICD-10-CM

## 2013-11-30 LAB — BASIC METABOLIC PANEL
Anion gap: 14 (ref 5–15)
BUN: 4 mg/dL — AB (ref 6–23)
CO2: 21 mEq/L (ref 19–32)
Calcium: 8.2 mg/dL — ABNORMAL LOW (ref 8.4–10.5)
Chloride: 98 mEq/L (ref 96–112)
Creatinine, Ser: 0.81 mg/dL (ref 0.50–1.35)
GFR calc Af Amer: >90 mL/min (ref >90)
Glucose, Bld: 95 mg/dL (ref 70–99)
POTASSIUM: 3.3 meq/L — AB (ref 3.7–5.3)
SODIUM: 133 meq/L — AB (ref 137–147)

## 2013-11-30 LAB — SEDIMENTATION RATE: SED RATE: 15 mm/h (ref 0–16)

## 2013-11-30 LAB — CBC
HCT: 28.7 % — ABNORMAL LOW (ref 39.0–52.0)
Hemoglobin: 9.8 g/dL — ABNORMAL LOW (ref 13.0–17.0)
MCH: 31.9 pg (ref 26.0–34.0)
MCHC: 34.1 g/dL (ref 30.0–36.0)
MCV: 93.5 fL (ref 78.0–100.0)
Platelets: 194 10*3/uL (ref 150–400)
RBC: 3.07 MIL/uL — ABNORMAL LOW (ref 4.22–5.81)
RDW: 14.5 % (ref 11.5–15.5)
WBC: 3.5 10*3/uL — ABNORMAL LOW (ref 4.0–10.5)

## 2013-11-30 LAB — TSH: TSH: 1.71 u[IU]/mL (ref 0.350–4.500)

## 2013-11-30 LAB — LACTIC ACID, PLASMA: LACTIC ACID, VENOUS: 1.2 mmol/L (ref 0.5–2.2)

## 2013-11-30 MED ORDER — LORAZEPAM 2 MG/ML IJ SOLN
2.0000 mg | Freq: Three times a day (TID) | INTRAMUSCULAR | Status: DC
  Administered 2013-11-30 – 2013-12-01 (×3): 2 mg via INTRAVENOUS
  Filled 2013-11-30 (×3): qty 1

## 2013-11-30 MED ORDER — POTASSIUM CHLORIDE CRYS ER 20 MEQ PO TBCR
40.0000 meq | EXTENDED_RELEASE_TABLET | Freq: Once | ORAL | Status: AC
  Administered 2013-11-30: 40 meq via ORAL
  Filled 2013-11-30: qty 2

## 2013-11-30 NOTE — Progress Notes (Signed)
[facility] PROGRESS NOTE Subjective Pt and wife disagree about the # of BMs  Appears to be having 3-4 loose stools  Objective: Vital signs in last 24 hours: Temp:  [98.3 F (36.8 C)-98.7 F (37.1 C)] 98.4 F (36.9 C) (10/27 0839) Pulse Rate:  [92-112] 112 (10/27 0839) Resp:  [10-26] 10 (10/27 0839) BP: (93-162)/(41-99) 162/41 mmHg (10/27 0839) SpO2:  [95 %-100 %] 100 % (10/27 0839) Weight:  [84.5 kg (186 lb 4.6 oz)] 84.5 kg (186 lb 4.6 oz) (10/27 0400) Last BM Date: 11/29/13  Intake/Output from previous day: 10/26 0701 - 10/27 0700 In: 3176.7 [P.O.:720; I.V.:2456.7] Out: 255 [Urine:251; Stool:4] Intake/Output this shift: Total I/O In: -  Out: 800 [Urine:800]  PE:  Abdomen--soft  Lab Results:  Recent Labs  11/28/13 1538 11/29/13 0515 11/30/13 0330  WBC 4.4 8.1 3.5*  HGB 12.5* 10.1* 9.8*  HCT 35.8* 29.2* 28.7*  PLT 284 252 194   BMET  Recent Labs  11/28/13 1538 11/29/13 0515 11/30/13 0330  NA 133* 129* 133*  K 4.1 3.8 3.3*  CL 90* 91* 98  CO2 24 21 21   CREATININE 0.83 0.88 0.81   LFT  Recent Labs  11/28/13 1538 11/29/13 0515  PROT 7.7 6.5  AST 55* 60*  ALT 27 24  ALKPHOS 124* 105  BILITOT 0.3 0.8   PT/INR  Recent Labs  11/29/13 0515  LABPROT 13.6  INR 1.03   PANCREAS  Recent Labs  11/28/13 1538  LIPASE 25         Studies/Results: Dg Chest 2 View  11/29/2013   CLINICAL DATA:  Shortness of breath, productive cough.  EXAM: CHEST  2 VIEW  COMPARISON:  11/18/2013, 06/14/2013 CT  FINDINGS: Areas of interstitial coarsening and patchy airspace opacities. No pleural effusion or pneumothorax. Cardiomediastinal contours within normal range. Hyperinflation with increased AP diameter. Multilevel degenerative changes. Diffuse osteopenia. Upper lumbar compression fracture again noted.  IMPRESSION: Interstitial coarsening and mild scattered patchy airspace opacities, similar to priors.   Electronically Signed   By: Jearld Lesch  M.D.   On: 11/29/2013 04:12    Medications: I have reviewed the patient's current medications.  Assessment/Plan: 1. Diarrhea. Still refusing colonoscopy but states may do as OP. Given my card and will see in office in several weeks ? He will come. Would go ahead and try on antispasmodics as  OP ? Dicyclomine 20 mg tid to see if that helps.  Will sign off now unless needed.   Kathey Simer JR,Chung L 11/30/2013, 10:12 AM

## 2013-11-30 NOTE — Progress Notes (Signed)
Family Medicine Teaching Service Daily Progress Note Intern Pager: [phone]  Patient name: Juan [name] Medical record number: [id number] Date of birth: 11/01/1950 Age: 63 y.o. Gender: male  Primary Care Provider: Annabell Sabal, MD Consultants: GI, Psych  Code Status: full code  Pt Overview and Major Events to Date:  10/25 - admit to FPTS for diarrhea, lactic acidosis  Assessment and Plan:  Juan [name] is a 63 y.o. male presenting with diarrhea. PMH is significant for alcoholism, HTN, diastolic HF, hyponatremia, seizures 2/2 alcohol withdrawal.   #Dehydration 2/2 Diarrhea - recent admission to [facility] for colitis, treated with cipro/flagyl x7d. Diarrhea has again worsened. Negative C diff, O&P, stool culture at [facility]. Recent CT abd/pelvis (10/15) with chronic colitis without obstruction. Lactic acid elevated to 4.34 on admission. No leukocytosis or fever. Unclear etiology at this time.  - C diff negative, TSH wnl (1.710) - PT consult for weakness - rec 24 hr supervision - GI following, appreciate recs - rec colonoscopy (patient refused), f/u fecal eleastase  #Lactic acidosis: Resolved. Likely 2/2 volume depletion/poor perfusion. Lactic acid 4.34>3.2>1.2, AG 19>17>14.   # Psych: Failed mini-cog (10/27). Depression/anxiety  - Psych consulted, appreciate recs - Patient has capacity for decision making - Continue Cymbalta 67m daily and Neurontin 3061mTOD  #Hyponatremia: Na 133 >129>133 (seems to be around baseline). Likely related to chronic alcohol abuse.  - Continue to monitor  - IVF as above   #Hypokalemia: K 3.3 this AM. - KDur 2037mtoday - Continue to monitor  #Alcohol Abuse: patient has h/o alcohol withdrawal seizures. AST elevated. CIWA 0-7 O/N (received 9mg52m ativan O/N).  Tachycardia improving, could be related to withdrawal. - Monitor on CIWA protocol with scheduled ativan  - SW consult for substance abuse   #Normocytic Anemia: Hemoglobin anywhere from 9-12 in recent  past. Hgb 9.8 today. Likely related to alcohol abuse chronically, and a dilutional effect on IVF currently - Continue to monitor  #Diastolic heart failure: Echo in 09/2013 with EF 60-644-03%ade 1 diastolic dysfunction. Appear euvolemic on exam. CXR without volume overload. - Monitor fluid status carefully  - Daily weights and strict Is&Os   - Holding home Lasix. Could likely stop Spironolactone (pEF, no evidence of cirrhosis yet) - Troponins neg x3  #Umbilical hernia - shown to be fat containing on recent CT abd/pelvis (10/15). No signs of incarceration currently. - Consider consult to gen surgery - Continue to monitor   #HTN: Labile BPs O/N.  - Continue home Spironolactone, Coreg, Lisinopril  - Continue to monitor   #COPD: Wheezing improving. Satting well on RA. CXR clear. - Continue to monitor respiratory status  - Continue home spiriva, singulair  - Albuterol nebs q6h prn   #Psoriasis: On Triamancinolone at home  - Lidex ointment BID   FEN/GI: Advance to full liquid diet, KVO  Prophylaxis: Sub q heparin  Disposition: Pending improvement in symptoms  Subjective:  Patient reports only 1 episode of diarrhea overnight.  Feels as though withdrawal symptoms are improving. Would like colonoscopy as outpatient not inpatient  Objective: Temp:  [98.3 F (36.8 C)-98.7 F (37.1 C)] 98.4 F (36.9 C) (10/27 0839) Pulse Rate:  [92-112] 112 (10/27 0839) Resp:  [10-26] 10 (10/27 0839) BP: (93-162)/(41-99) 162/41 mmHg (10/27 0839) SpO2:  [95 %-100 %] 100 % (10/27 0839) Weight:  [186 lb 4.6 oz (84.5 kg)] 186 lb 4.6 oz (84.5 kg) (10/27 0400) Physical Exam: General: Sitting in bed in NAD  HEENT: NCAT, PERRL, EOMI Cardiovascular: RRR, no m/r/g  Respiratory: Mild wheezing in all lung fields, no crackles/rhonchi  Abdomen: Soft, NDNT, no hepatomegaly, +BS. Umbilical hernia without BS, darkened skin overlying.  Extremities: No edema  Skin: Psoriatic rash covering much of anterior LEs, less  severe on UEs, overlying umbilical hernia in addition. Some excoriations around rash.  Neuro: CN 2-12 grossly intact. A&Ox3, no focal deficits.    Laboratory:  Recent Labs Lab 11/28/13 1538 11/29/13 0515 11/30/13 0330  WBC 4.4 8.1 3.5*  HGB 12.5* 10.1* 9.8*  HCT 35.8* 29.2* 28.7*  PLT 284 252 194    Recent Labs Lab 11/28/13 1538 11/29/13 0515 11/30/13 0330  NA 133* 129* 133*  K 4.1 3.8 3.3*  CL 90* 91* 98  CO2 _0 BUN 12 10 4*  CREATININE 0.83 0.88 0.81  CALCIUM 8.7 8.1* 8.2*  PROT 7.7 6.5  --   BILITOT 0.3 0.8  --   ALKPHOS 124* 105  --   ALT 27 24  --   AST 55* 60*  --   GLUCOSE 144* 119* 95    Lactic acid 4.34 > 4.4 > 3.2 > 1.2  TSH (10/27): 1.710  C Diff PCR (10/26): negative  ESR (10/27): 15  Imaging/Diagnostic Tests: EKG (10/25): Sinus tachycardia, RBBB, non-ischemic  CXR (10/26): Interstitial coarsening and mild scattered patchy airspace  opacities, similar to priors.   Lavon Paganini, MD 11/30/2013, 9:43 AM PGY-1, [place] Intern pager: [phone], text pages welcome

## 2013-11-30 NOTE — Progress Notes (Signed)
Physical Therapy Treatment Patient Details Name: Magda BernheimJames K Marlette MRN: [id number] DOB: 1950-10-06 Today's Date: 11/30/2013    History of Present Illness Magda BernheimJames K Arif is a 63 y.o. male presenting with diarrhea; [facility]. PMH is significant for alcoholism, HTN, diastolic HF, hyponatremia, seizures 2/2 alcohol withdrawal    PT Comments    Attempted to place rubber tip on pt's "walking stick", however it is not a walking stick, but a single legged camera mount, similar to a tripod.  Pt ed on camera mount not being a safe AD, however pt is adamant about the safety and durability of his "walking stick".  Standard cane rubber tip does not fit on pt's "walking stick".  Will continue to follow while on acute.    Follow Up Recommendations  Supervision/Assistance - 24 hour;Home health PT (pt to go to [facility] rehab at D/C)     Equipment Recommendations  None recommended by PT    Recommendations for Other Services       Precautions / Restrictions Precautions Precautions: Fall Restrictions Weight Bearing Restrictions: No    Mobility  Bed Mobility Overal bed mobility: Needs Assistance Bed Mobility: Supine to Sit;Sit to Supine     Supine to sit: Supervision;HOB elevated Sit to supine: Supervision;HOB elevated      Transfers Overall transfer level: Needs assistance Equipment used: None Transfers: Sit to/from Stand Sit to Stand: Supervision         General transfer comment: S for safety.    Ambulation/Gait Ambulation/Gait assistance: Min guard Ambulation Distance (Feet): 200 Feet Assistive device:  (pt's walking stick.  ) Gait Pattern/deviations: Step-through pattern;Decreased stride length     General Gait Details: pt moves slowly and mildly unsteady.  pt used his "walking stick" during mobility 2/2 pt stating he feels most comfortable with this.     Stairs            Wheelchair Mobility    Modified Rankin (Stroke Patients Only)        Balance Overall balance assessment: Needs assistance Sitting-balance support: No upper extremity supported;Feet supported Sitting balance-Leahy Scale: Good     Standing balance support: No upper extremity supported Standing balance-Leahy Scale: Fair                      Cognition Arousal/Alertness: Awake/alert Behavior During Therapy: WFL for tasks assessed/performed Overall Cognitive Status: History of cognitive impairments - at baseline                      Exercises      General Comments        Pertinent Vitals/Pain Pain Assessment: No/denies pain    Home Living                      Prior Function            PT Goals (current goals can now be found in the care plan section) Acute Rehab PT Goals Patient Stated Goal: to E[facility] rehab PT Goal Formulation: With patient Time For Goal Achievement: 12/13/13 Potential to Achieve Goals: Good Progress towards PT goals: Progressing toward goals    Frequency  Min 3X/week    PT Plan Current plan remains appropriate    Co-evaluation             End of Session Equipment Utilized During Treatment: Gait belt Activity Tolerance: Patient tolerated treatment well Patient left: in bed;with call bell/phone within reach;with  bed alarm set;with family/visitor present     Time: 5638-7564 PT Time Calculation (min): 37 min  Charges:  $Gait Training: 23-37 mins                    G CodesSunny Schlein, [place] [phone] 11/30/2013, 12:24 PM

## 2013-11-30 NOTE — Progress Notes (Signed)
FMTS Attending Note Patient seen and examined by me, discuss with resident team and I agree with Dr Penelope Coop note for today. Patient had restful night; one episode of diarrhea. Denies abdominal pain or nausea this morning.  He is amenable to having outpatient endoscopic evaluation.  Appreciate GI consult. Psych consult to assess patient's capacity for medical decision-making.  Paula Compton, MD

## 2013-12-01 ENCOUNTER — Encounter (HOSPITAL_COMMUNITY): Payer: Self-pay | Admitting: *Deleted

## 2013-12-01 LAB — CBC
HCT: 26.7 % — ABNORMAL LOW (ref 39.0–52.0)
HEMOGLOBIN: 9 g/dL — AB (ref 13.0–17.0)
MCH: 31.5 pg (ref 26.0–34.0)
MCHC: 33.7 g/dL (ref 30.0–36.0)
MCV: 93.4 fL (ref 78.0–100.0)
Platelets: 177 10*3/uL (ref 150–400)
RBC: 2.86 MIL/uL — ABNORMAL LOW (ref 4.22–5.81)
RDW: 14 % (ref 11.5–15.5)
WBC: 4.4 10*3/uL (ref 4.0–10.5)

## 2013-12-01 LAB — BASIC METABOLIC PANEL
Anion gap: 12 (ref 5–15)
CALCIUM: 8 mg/dL — AB (ref 8.4–10.5)
CO2: 24 mEq/L (ref 19–32)
Chloride: 95 mEq/L — ABNORMAL LOW (ref 96–112)
Creatinine, Ser: 0.71 mg/dL (ref 0.50–1.35)
GFR calc Af Amer: >90 mL/min (ref >90)
GFR calc non Af Amer: >90 mL/min (ref >90)
Glucose, Bld: 98 mg/dL (ref 70–99)
Potassium: 3.6 mEq/L — ABNORMAL LOW (ref 3.7–5.3)
SODIUM: 131 meq/L — AB (ref 137–147)

## 2013-12-01 MED ORDER — CLONAZEPAM 0.5 MG PO TABS: 0.5000 mg | ORAL_TABLET | Freq: Two times a day (BID) | ORAL | Status: DC | PRN

## 2013-12-01 MED ORDER — LORAZEPAM 1 MG PO TABS: 1.0000 mg | ORAL_TABLET | Freq: Three times a day (TID) | ORAL | Status: DC

## 2013-12-01 MED ORDER — LORAZEPAM 1 MG PO TABS
1.0000 mg | ORAL_TABLET | Freq: Three times a day (TID) | ORAL | Status: DC
  Administered 2013-12-01: 1 mg via ORAL
  Filled 2013-12-01: qty 2

## 2013-12-01 MED ORDER — DICYCLOMINE HCL 20 MG PO TABS
20.0000 mg | ORAL_TABLET | Freq: Three times a day (TID) | ORAL | Status: DC
  Administered 2013-12-01 (×2): 20 mg via ORAL
  Filled 2013-12-01 (×3): qty 1

## 2013-12-01 MED ORDER — BOOST / RESOURCE BREEZE PO LIQD: 1.0000 | Freq: Three times a day (TID) | ORAL | Status: DC

## 2013-12-01 MED ORDER — DICYCLOMINE HCL 20 MG PO TABS: 20.0000 mg | ORAL_TABLET | Freq: Three times a day (TID) | ORAL | Status: DC

## 2013-12-01 MED ORDER — DULOXETINE HCL 20 MG PO CPEP: 40.0000 mg | ORAL_CAPSULE | Freq: Every day | ORAL | Status: DC

## 2013-12-01 MED ORDER — POTASSIUM CHLORIDE CRYS ER 20 MEQ PO TBCR
40.0000 meq | EXTENDED_RELEASE_TABLET | Freq: Once | ORAL | Status: AC
  Administered 2013-12-01: 40 meq via ORAL
  Filled 2013-12-01: qty 2

## 2013-12-01 MED ORDER — LORAZEPAM 0.5 MG PO TABS: 1.0000 mg | ORAL_TABLET | Freq: Three times a day (TID) | ORAL | Status: DC

## 2013-12-01 MED ORDER — DULOXETINE HCL 40 MG PO CPEP: 40.0000 mg | ORAL_CAPSULE | Freq: Every day | ORAL | Status: DC

## 2013-12-01 NOTE — Progress Notes (Signed)
Family Medicine Teaching Service Daily Progress Note Intern Pager: [phone]  Patient name: Juan [name] Medical record number: [id number] Date of birth: Jun 24, 1950 Age: 64 y.o. Gender: male  Primary Care Provider: Annabell Sabal, MD Consultants: GI, Psych  Code Status: full code  Pt Overview and Major Events to Date:  10/25 - admit to FPTS for diarrhea, lactic acidosis  Assessment and Plan:  Juan [name] is a 63 y.o. male presenting with diarrhea. PMH is significant for alcoholism, HTN, diastolic HF, hyponatremia, seizures 2/2 alcohol withdrawal.   #Dehydration 2/2 Diarrhea - recent admission to [facility] for colitis, treated with cipro/flagyl x7d. Diarrhea has again worsened at time of admission; reports 3 BMs in the past 24 hours. Negative C diff, O&P, stool culture at [facility]. Recent CT abd/pelvis (10/15) with chronic colitis without obstruction. Lactic acid elevated to 4.34 on admission. No leukocytosis or fever. Unclear etiology at this time.  - C diff negative, TSH wnl (1.710) - PT consult for weakness - rec 24 hr supervision - GI following, appreciate recs - rec colonoscopy (patient expresses interest in outpatient colonoscopy), f/u fecal eleastase. GI recommendation to start Bentyl for antispasmodic.  #Lactic acidosis: Resolved. Likely 2/2 volume depletion/poor perfusion. Lactic acid 4.34>3.2>1.2, AG 19>17>14.   # Psych: Failed mini-cog (10/27). Depression/anxiety. Question whether acute delirium, which appears to have cleared. - Psych consulted, appreciate recs - Patient has capacity for decision making - Continue Cymbalta 60m daily and Neurontin 3030mTOD  #Hyponatremia: Na 133 >129>133 (seems to be around baseline). Likely related to chronic alcohol abuse.  - Continue to monitor  - IVF KVO.   #Hypokalemia: potassium 3.6 today. - KDur 4044mtoday - Continue to monitor (either in AM, or as outpatient).  #Alcohol Abuse: patient has h/o alcohol withdrawal seizures. AST elevated.  CIWA 0-7 O/N (received 9mg81m ativan O/N).  Tachycardia improving, could be related to withdrawal. - Monitor on CIWA protocol with scheduled ativan  - SW consult for substance abuse. Patient expresses interest in pursuing inpatient rehab after discharge, lists resources that he has for help with this.  #Normocytic Anemia: Hemoglobin anywhere from 9-12 in recent past. Hgb 9.8 today. Likely related to alcohol abuse chronically, and a dilutional effect on IVF currently - Continue to monitor  #Diastolic heart failure: Echo in 09/2013 with EF 60-617-71%ade 1 diastolic dysfunction. Appear euvolemic on exam. CXR without volume overload. - Monitor fluid status carefully  - Daily weights and strict Is&Os   - Holding home Lasix. Could likely stop Spironolactone (pEF, no evidence of cirrhosis yet) - Troponins neg x3  #Umbilical hernia - shown to be fat containing on recent CT abd/pelvis (10/15). No signs of incarceration currently. - Consider consult to gen surgery - Continue to monitor   #HTN: Continues with mildly elevated BPs.  - Continue home Spironolactone, Coreg, Lisinopril  - Continue to monitor   #COPD: Wheezing improving. Satting well on RA. CXR clear. - Continue to monitor respiratory status  - Continue home spiriva, singulair  - Albuterol nebs q6h prn   #Psoriasis: On Triamancinolone at home  - Lidex ointment BID   FEN/GI: Advance to low-fat diet, KVO  Prophylaxis: Sub q heparin  Disposition: Patient to be transferred to telemetry. To consider discharge to home this evening if remains stable, tolerates diet, no exacerbation of diarrhea.  Subjective:  Patient reports only 1 small-volume stool early yesterday evening, 3 total in past 24hrs.  Feels as though withdrawal symptoms are improving, CIWA scores are zero in past 24 hours, despite  scaling back on scheduled lorazepam. Expresses interest in eating more substantive meals, as well as being discharged. He expresses interest in  seeking inpatient detox from alcohol upon discharge, has resources for this.  Objective: Temp:  [97.8 F (36.6 C)-98.6 F (37 C)] 97.8 F (36.6 C) (10/28 0818) Pulse Rate:  [81-112] 83 (10/28 0818) Resp:  [10-20] 19 (10/28 0818) BP: (133-162)/(41-95) 148/90 mmHg (10/28 0818) SpO2:  [95 %-100 %] 97 % (10/28 0818) Weight:  [190 lb 0.6 oz (86.2 kg)] 190 lb 0.6 oz (86.2 kg) (10/28 0356) Physical Exam: General: Sitting in bed in NAD. Pleasant, appropriate speech. HEENT:Neck supple, no cervical adenopathy. Cardiovascular: Regular S1S2, no extra sounds Respiratory: Good air movement without wheezes/rales noted.  Abdomen: Soft, nontender, audible bowel sounds. Umbilical hernia soft, nontender.  Extremities: No edema  Skin: Psoriatic rash covering much of anterior LEs, less severe on UEs, overlying umbilical hernia in addition. Some excoriations around rash.  NEURO: oriented to place/time; speech appropriate in content and cadence.   Laboratory:  Recent Labs Lab 11/29/13 0515 11/30/13 0330 12/01/13 0309  WBC 8.1 3.5* 4.4  HGB 10.1* 9.8* 9.0*  HCT 29.2* 28.7* 26.7*  PLT 252 194 177    Recent Labs Lab 11/28/13 1538 11/29/13 0515 11/30/13 0330 12/01/13 0309  NA 133* 129* 133* 131*  K 4.1 3.8 3.3* 3.6*  CL 90* 91* 98 95*  CO2 '24 21 21 24  ' BUN 12 10 4* <3*  CREATININE 0.83 0.88 0.81 0.71  CALCIUM 8.7 8.1* 8.2* 8.0*  PROT 7.7 6.5  --   --   BILITOT 0.3 0.8  --   --   ALKPHOS 124* 105  --   --   ALT 27 24  --   --   AST 55* 60*  --   --   GLUCOSE 144* 119* 95 98    Lactic acid 4.34 > 4.4 > 3.2 > 1.2  TSH (10/27): 1.710  C Diff PCR (10/26): negative  ESR (10/27): 15  Imaging/Diagnostic Tests: EKG (10/25): Sinus tachycardia, RBBB, non-ischemic  CXR (10/26): Interstitial coarsening and mild scattered patchy airspace  opacities, similar to priors.   Willeen Niece, MD 12/01/2013, 8:35 AM [place] Intern pager: [phone], text pages  welcome

## 2013-12-01 NOTE — Discharge Instructions (Signed)
Discharge Date: 12/01/2013  -You have some new medications this hospital stay. Please take as prescribed. -You are being given Ativan for withdrawal. Please take as prescibed for 3 days. Then you resume your Clonazepam. DO NOT TAKE TOGETHER. -Please make sure to call the appropriate number to enroll in inpatient rehab. This is important for your health.  -Make a follow-up appointment with our clinic  Thank you for letting us participate in your care!

## 2013-12-01 NOTE — Clinical Social Work Note (Signed)
CSW spoke with patient at bedside concerning substance use.  Patient stated that he wants to stop drinking at this time because of his wife.  Patient reported that wife works for [employer] and that she often sees people who are dying because of substance use related illness.  Patient stated that he has never been to rehab but is interested in outpatient resources. Patient has quit for several weeks in the past after health related issues but returned to drinking due to traumatic events.  Patient states that he feels like he could do it on his own but is willing to participate in rehab for his wife.  CSW gave patient list of rehab facilities and patient stated that he already has an appointment to speak with his psychiatrist in a week concerning his substance abuse and which program he should work with to work on his addition.  CSW signing off.  Merlyn Lot, LCSWA Clinical Social Worker [phone]

## 2013-12-01 NOTE — Consult Note (Signed)
Psychiatry Consult follow up note  Juan [name] is an 63 y.o. male. Total Time spent with patient: 45 minutes  Assessment: AXIS I:  Alcohol Abuse and Major Depression, Recurrent severe AXIS II:  Deferred AXIS III:   Past Medical History  Diagnosis Date  . CHF (congestive heart failure) 10/2010    ECHO:  EF 40%, Grade II diastolic dysfunction  . Alcoholism   . Allergy     seasonal   . Anxiety   . Depression   . Asthma   . Hyperlipidemia   . Hypertension   . Seizures    AXIS IV:  other psychosocial or environmental problems, problems related to social environment and problems with primary support group AXIS V:  51-60 moderate symptoms  Plan:  Patient has capacity to make his own medical decisions and living arrangements Refer to psych social service regarding chemical dependency treatment programs as patient and his family need assistance to make appropriate treatment program when medically stable Increase Cymbalta 40 mg PO QD for depression and Neurontin 300 mg TID for neuropathic pain and anxiety  Supportive therapy provided about ongoing stressors. Refer to IOP. Psychiatric consultation follow up as clinically needed Please contact [phone] if needs further assistance  Subjective:   Juan [name] is a 63 y.o. male is seen psychiatric consultation for alcohol abuse vs dependence, depression, anxiety and capacity evaluation. Patient spouse is at bed side and provided brief information. Patient endorses being depressed and having anxiety and seeing out patient psychiatrist at [facility] psychiatry and partial compliance with medications. He also endorsing regular alcohol ingestion and increased liver enzymes. Her has denied current symptoms of suicidal or homicidal ideation, intention or plans. He has no evidence of psychosis. He has history seizures due to alcohol withdrawal but denied history of detox treatment and rehab treatment.  Interval history: Patient has been  taking his medication Cymbalta and tolerated without side effects. Patient states that he is feeling some what better and wishes to increased for better control of his depression. Patient states that he has very few small BM today but feels his stomach seems to be settling down.  Reportedly he is able to sleep better last night and has better oral intake. Patient agree with substance abuse treatment but needs assistance to choose better program fits in to him and his family. Discussed briefly about out patient, Intensive out patient and in patient rehab treatments and recommend [facility] either at [facility] and [facility]. Patient and his wife agree to discuss further with social service.   Past Psychiatric History: Past Medical History  Diagnosis Date  . CHF (congestive heart failure) 10/2010    ECHO:  EF 40%, Grade II diastolic dysfunction  . Alcoholism   . Allergy     seasonal   . Anxiety   . Depression   . Asthma   . Hyperlipidemia   . Hypertension   . Seizures     reports that he has been smoking Cigarettes.  He has a 40 pack-year smoking history. He has never used smokeless tobacco. He reports that he drinks alcohol. He reports that he does not use illicit drugs. Family History  Problem Relation Age of Onset  . Alzheimer's disease Mother   . Diabetes Mother   . Heart disease Maternal Uncle      Living Arrangements: Spouse/significant other   Abuse/Neglect [facility]) Physical Abuse: Denies Verbal Abuse: Denies Sexual Abuse: Denies Allergies:  No Known Allergies  Objective: Blood pressure 148/90, pulse 85,  temperature 97.8 F (36.6 C), temperature source Oral, resp. rate 20, height '5\' 7"'  (1.702 m), weight 86.2 kg (190 lb 0.6 oz), SpO2 98.00%.Body mass index is 29.76 kg/(m^2). Results for orders placed during the hospital encounter of 11/28/13 (from the past 72 hour(s))  CBC WITH DIFFERENTIAL     Status: Abnormal   Collection Time    11/28/13  3:38 PM      Result Value Ref Range   WBC  4.4  4.0 - 10.5 K/uL   RBC 3.96 (*) 4.22 - 5.81 MIL/uL   Hemoglobin 12.5 (*) 13.0 - 17.0 g/dL   HCT 35.8 (*) 39.0 - 52.0 %   MCV 90.4  78.0 - 100.0 fL   MCH 31.6  26.0 - 34.0 pg   MCHC 34.9  30.0 - 36.0 g/dL   RDW 14.1  11.5 - 15.5 %   Platelets 284  150 - 400 K/uL   Neutrophils Relative % 56  43 - 77 %   Neutro Abs 2.5  1.7 - 7.7 K/uL   Lymphocytes Relative 24  12 - 46 %   Lymphs Abs 1.1  0.7 - 4.0 K/uL   Monocytes Relative 18 (*) 3 - 12 %   Monocytes Absolute 0.8  0.1 - 1.0 K/uL   Eosinophils Relative 1  0 - 5 %   Eosinophils Absolute 0.1  0.0 - 0.7 K/uL   Basophils Relative 1  0 - 1 %   Basophils Absolute 0.1  0.0 - 0.1 K/uL  COMPREHENSIVE METABOLIC PANEL     Status: Abnormal   Collection Time    11/28/13  3:38 PM      Result Value Ref Range   Sodium 133 (*) 137 - 147 mEq/L   Potassium 4.1  3.7 - 5.3 mEq/L   Chloride 90 (*) 96 - 112 mEq/L   CO2 24  19 - 32 mEq/L   Glucose, Bld 144 (*) 70 - 99 mg/dL   BUN 12  6 - 23 mg/dL   Creatinine, Ser 0.83  0.50 - 1.35 mg/dL   Calcium 8.7  8.4 - 10.5 mg/dL   Total Protein 7.7  6.0 - 8.3 g/dL   Albumin 3.5  3.5 - 5.2 g/dL   AST 55 (*) 0 - 37 U/L   ALT 27  0 - 53 U/L   Alkaline Phosphatase 124 (*) 39 - 117 U/L   Total Bilirubin 0.3  0.3 - 1.2 mg/dL   GFR calc non Af Amer >90  >90 mL/min   GFR calc Af Amer >90  >90 mL/min   Comment: (NOTE)     The eGFR has been calculated using the CKD EPI equation.     This calculation has not been validated in all clinical situations.     eGFR's persistently <90 mL/min signify possible Chronic Kidney     Disease.   Anion gap 19 (*) 5 - 15  LIPASE, BLOOD     Status: None   Collection Time    11/28/13  3:38 PM      Result Value Ref Range   Lipase 25  11 - 59 U/L  I-STAT CG4 LACTIC ACID, ED     Status: Abnormal   Collection Time    11/28/13  5:30 PM      Result Value Ref Range   Lactic Acid, Venous 4.34 (*) 0.5 - 2.2 mmol/L  TROPONIN I     Status: None   Collection Time    11/28/13 11:55 PM  Result Value Ref Range   Troponin I <0.30  <0.30 ng/mL   Comment:            Due to the release kinetics of cTnI,     a negative result within the first hours     of the onset of symptoms does not rule out     myocardial infarction with certainty.     If myocardial infarction is still suspected,     repeat the test at appropriate intervals.  LACTIC ACID, PLASMA     Status: Abnormal   Collection Time    11/28/13 11:58 PM      Result Value Ref Range   Lactic Acid, Venous 4.4 (*) 0.5 - 2.2 mmol/L  CLOSTRIDIUM DIFFICILE BY PCR     Status: None   Collection Time    11/29/13  4:55 AM      Result Value Ref Range   C difficile by pcr NEGATIVE  NEGATIVE  COMPREHENSIVE METABOLIC PANEL     Status: Abnormal   Collection Time    11/29/13  5:15 AM      Result Value Ref Range   Sodium 129 (*) 137 - 147 mEq/L   Potassium 3.8  3.7 - 5.3 mEq/L   Chloride 91 (*) 96 - 112 mEq/L   CO2 21  19 - 32 mEq/L   Glucose, Bld 119 (*) 70 - 99 mg/dL   BUN 10  6 - 23 mg/dL   Creatinine, Ser 0.88  0.50 - 1.35 mg/dL   Calcium 8.1 (*) 8.4 - 10.5 mg/dL   Total Protein 6.5  6.0 - 8.3 g/dL   Albumin 3.0 (*) 3.5 - 5.2 g/dL   AST 60 (*) 0 - 37 U/L   ALT 24  0 - 53 U/L   Alkaline Phosphatase 105  39 - 117 U/L   Total Bilirubin 0.8  0.3 - 1.2 mg/dL   GFR calc non Af Amer >90  >90 mL/min   GFR calc Af Amer >90  >90 mL/min   Comment: (NOTE)     The eGFR has been calculated using the CKD EPI equation.     This calculation has not been validated in all clinical situations.     eGFR's persistently <90 mL/min signify possible Chronic Kidney     Disease.   Anion gap 17 (*) 5 - 15  CBC     Status: Abnormal   Collection Time    11/29/13  5:15 AM      Result Value Ref Range   WBC 8.1  4.0 - 10.5 K/uL   RBC 3.14 (*) 4.22 - 5.81 MIL/uL   Hemoglobin 10.1 (*) 13.0 - 17.0 g/dL   Comment: REPEATED TO VERIFY   HCT 29.2 (*) 39.0 - 52.0 %   MCV 93.0  78.0 - 100.0 fL   MCH 32.2  26.0 - 34.0 pg   MCHC 34.6  30.0 -  36.0 g/dL   RDW 14.5  11.5 - 15.5 %   Platelets 252  150 - 400 K/uL  PROTIME-INR     Status: None   Collection Time    11/29/13  5:15 AM      Result Value Ref Range   Prothrombin Time 13.6  11.6 - 15.2 seconds   INR 1.03  0.00 - 1.49  MAGNESIUM     Status: None   Collection Time    11/29/13  5:15 AM      Result Value Ref Range   Magnesium 1.5  1.5 - 2.5 mg/dL  PHOSPHORUS     Status: None   Collection Time    11/29/13  5:15 AM      Result Value Ref Range   Phosphorus 2.9  2.3 - 4.6 mg/dL  TROPONIN I     Status: None   Collection Time    11/29/13  5:15 AM      Result Value Ref Range   Troponin I <0.30  <0.30 ng/mL   Comment:            Due to the release kinetics of cTnI,     a negative result within the first hours     of the onset of symptoms does not rule out     myocardial infarction with certainty.     If myocardial infarction is still suspected,     repeat the test at appropriate intervals.  LACTIC ACID, PLASMA     Status: Abnormal   Collection Time    11/29/13  7:02 AM      Result Value Ref Range   Lactic Acid, Venous 3.2 (*) 0.5 - 2.2 mmol/L  TROPONIN I     Status: None   Collection Time    11/29/13 10:50 AM      Result Value Ref Range   Troponin I <0.30  <0.30 ng/mL   Comment:            Due to the release kinetics of cTnI,     a negative result within the first hours     of the onset of symptoms does not rule out     myocardial infarction with certainty.     If myocardial infarction is still suspected,     repeat the test at appropriate intervals.  CBC     Status: Abnormal   Collection Time    11/30/13  3:30 AM      Result Value Ref Range   WBC 3.5 (*) 4.0 - 10.5 K/uL   RBC 3.07 (*) 4.22 - 5.81 MIL/uL   Hemoglobin 9.8 (*) 13.0 - 17.0 g/dL   HCT 28.7 (*) 39.0 - 52.0 %   MCV 93.5  78.0 - 100.0 fL   MCH 31.9  26.0 - 34.0 pg   MCHC 34.1  30.0 - 36.0 g/dL   RDW 14.5  11.5 - 15.5 %   Platelets 194  150 - 400 K/uL  BASIC METABOLIC PANEL     Status:  Abnormal   Collection Time    11/30/13  3:30 AM      Result Value Ref Range   Sodium 133 (*) 137 - 147 mEq/L   Potassium 3.3 (*) 3.7 - 5.3 mEq/L   Chloride 98  96 - 112 mEq/L   CO2 21  19 - 32 mEq/L   Glucose, Bld 95  70 - 99 mg/dL   BUN 4 (*) 6 - 23 mg/dL   Creatinine, Ser 0.81  0.50 - 1.35 mg/dL   Calcium 8.2 (*) 8.4 - 10.5 mg/dL   GFR calc non Af Amer >90  >90 mL/min   GFR calc Af Amer >90  >90 mL/min   Comment: (NOTE)     The eGFR has been calculated using the CKD EPI equation.     This calculation has not been validated in all clinical situations.     eGFR's persistently <90 mL/min signify possible Chronic Kidney     Disease.   Anion gap 14  5 - 15  LACTIC ACID, PLASMA  Status: None   Collection Time    11/30/13  3:30 AM      Result Value Ref Range   Lactic Acid, Venous 1.2  0.5 - 2.2 mmol/L  SEDIMENTATION RATE     Status: None   Collection Time    11/30/13  3:30 AM      Result Value Ref Range   Sed Rate 15  0 - 16 mm/hr  TSH     Status: None   Collection Time    11/30/13  3:30 AM      Result Value Ref Range   TSH 1.710  0.350 - 4.500 uIU/mL  CBC     Status: Abnormal   Collection Time    12/01/13  3:09 AM      Result Value Ref Range   WBC 4.4  4.0 - 10.5 K/uL   RBC 2.86 (*) 4.22 - 5.81 MIL/uL   Hemoglobin 9.0 (*) 13.0 - 17.0 g/dL   HCT 26.7 (*) 39.0 - 52.0 %   MCV 93.4  78.0 - 100.0 fL   MCH 31.5  26.0 - 34.0 pg   MCHC 33.7  30.0 - 36.0 g/dL   RDW 14.0  11.5 - 15.5 %   Platelets 177  150 - 400 K/uL  BASIC METABOLIC PANEL     Status: Abnormal   Collection Time    12/01/13  3:09 AM      Result Value Ref Range   Sodium 131 (*) 137 - 147 mEq/L   Potassium 3.6 (*) 3.7 - 5.3 mEq/L   Chloride 95 (*) 96 - 112 mEq/L   CO2 24  19 - 32 mEq/L   Glucose, Bld 98  70 - 99 mg/dL   BUN <3 (*) 6 - 23 mg/dL   Creatinine, Ser 0.71  0.50 - 1.35 mg/dL   Calcium 8.0 (*) 8.4 - 10.5 mg/dL   GFR calc non Af Amer >90  >90 mL/min   GFR calc Af Amer >90  >90 mL/min   Comment:  (NOTE)     The eGFR has been calculated using the CKD EPI equation.     This calculation has not been validated in all clinical situations.     eGFR's persistently <90 mL/min signify possible Chronic Kidney     Disease.   Anion gap 12  5 - 15   Labs are reviewed and are pertinent for elevated AST.  Current Facility-Administered Medications  Medication Dose Route Frequency Provider Last Rate Last Dose  . 0.9 %  sodium chloride infusion   Intravenous Continuous Lavon Paganini, MD      . acetaminophen (TYLENOL) tablet 650 mg  650 mg Oral Q6H PRN Coral Spikes, DO       Or  . acetaminophen (TYLENOL) suppository 650 mg  650 mg Rectal Q6H PRN Coral Spikes, DO      . albuterol (PROVENTIL) (2.5 MG/3ML) 0.083% nebulizer solution 5 mg  5 mg Nebulization Q6H PRN Coral Spikes, DO   5 mg at 12/01/13 0946  . aspirin EC tablet 81 mg  81 mg Oral Daily Coral Spikes, DO   81 mg at 12/01/13 1048  . atorvastatin (LIPITOR) tablet 40 mg  40 mg Oral q1800 Coral Spikes, DO   40 mg at 11/30/13 1844  . carvedilol (COREG) tablet 6.25 mg  6.25 mg Oral BID WC Coral Spikes, DO   6.25 mg at 12/01/13 0844  . dicyclomine (BENTYL) tablet 20 mg  20 mg Oral  TID AC Willeen Niece, MD      . DULoxetine (CYMBALTA) DR capsule 30 mg  30 mg Oral Daily Durward Parcel, MD   30 mg at 12/01/13 1048  . feeding supplement (RESOURCE BREEZE) (RESOURCE BREEZE) liquid 1 Container  1 Container Oral TID BM Kallie Locks, RD   1 Container at 12/01/13 1049  . fluocinonide ointment (LIDEX) 0.05 %   Topical BID Coral Spikes, DO      . folic acid (FOLVITE) tablet 1 mg  1 mg Oral Daily Blane Ohara McDiarmid, MD   1 mg at 12/01/13 1048  . gabapentin (NEURONTIN) capsule 300 mg  300 mg Oral TID Coral Spikes, DO   300 mg at 12/01/13 1048  . heparin injection 5,000 Units  5,000 Units Subcutaneous 3 times per day Coral Spikes, DO   5,000 Units at 12/01/13 4098  . lisinopril (PRINIVIL,ZESTRIL) tablet 20 mg  20 mg Oral Daily Jayce G Cook, DO   20 mg  at 12/01/13 1048  . LORazepam (ATIVAN) injection 2-3 mg  2-3 mg Intravenous Q1H PRN Lavon Paganini, MD      . LORazepam (ATIVAN) tablet 1 mg  1 mg Oral 3 times per day Katheren Shams, DO      . montelukast (SINGULAIR) tablet 10 mg  10 mg Oral QHS Coral Spikes, DO   10 mg at 11/30/13 2250  . multivitamin with minerals tablet 1 tablet  1 tablet Oral Daily Blane Ohara McDiarmid, MD   1 tablet at 12/01/13 1048  . ondansetron (ZOFRAN) tablet 4 mg  4 mg Oral Q6H PRN Coral Spikes, DO       Or  . ondansetron (ZOFRAN) injection 4 mg  4 mg Intravenous Q6H PRN Coral Spikes, DO   4 mg at 11/29/13 0003  . spironolactone (ALDACTONE) tablet 12.5 mg  12.5 mg Oral Daily Jayce G Cook, DO   12.5 mg at 12/01/13 1048  . thiamine (VITAMIN B-1) tablet 100 mg  100 mg Oral Daily Lavon Paganini, MD   100 mg at 12/01/13 1048  . tiotropium (SPIRIVA) inhalation capsule 18 mcg  18 mcg Inhalation Daily Coral Spikes, DO   18 mcg at 12/01/13 0940    Psychiatric Specialty Exam: Physical Exam as per history and physical  Review of Systems  Constitutional: Positive for malaise/fatigue.  Gastrointestinal: Positive for diarrhea.  Musculoskeletal: Positive for myalgias.  Neurological: Positive for weakness.  Psychiatric/Behavioral: Positive for depression and substance abuse. The patient is nervous/anxious and has insomnia.     Blood pressure 148/90, pulse 85, temperature 97.8 F (36.6 C), temperature source Oral, resp. rate 20, height '5\' 7"'  (1.702 m), weight 86.2 kg (190 lb 0.6 oz), SpO2 98.00%.Body mass index is 29.76 kg/(m^2).  General Appearance: Disheveled  Eye Sport and exercise psychologist::  Fair  Speech:  Clear and Coherent and Slow  Volume:  Decreased  Mood:  Anxious and Depressed  Affect:  Constricted and Depressed  Thought Process:  Coherent and Goal Directed  Orientation:  Full (Time, Place, and Person)  Thought Content:  WDL  Suicidal Thoughts:  No  Homicidal Thoughts:  No  Memory:  Immediate;   Fair Recent;   Poor   Judgement:  Intact  Insight:  Fair  Psychomotor Activity:  Decreased  Concentration:  Fair  Recall:  [place] of Knowledge:Good  Language: Good  Akathisia:  NA  Handed:  Right  AIMS (if indicated):     Assets:  Communication Skills Desire  for Improvement Financial Resources/Insurance Housing Intimacy Leisure Time Resilience Social Support Transportation  Sleep:      Musculoskeletal: Strength & Muscle Tone: decreased Gait & Station: unable to stand Patient leans: N/A  Treatment Plan Summary: Daily contact with patient to assess and evaluate symptoms and progress in treatment Medication management Increase Cymbalta 40 mg PO QD for depression starting tomorrow Continue Neurontin 300 mg TID for anxiety and neuropathic pains  Refer to psychiatric social services for assisting in substance abuse referral Refer to [facility] for out patient medication management of depression when medically stable  Ainsley [name],Juan R. 12/01/2013 12:02 PM

## 2013-12-01 NOTE — Progress Notes (Signed)
12/01/13 1540 Called Report. Patient transferring to 3 [facility] Room 23.

## 2013-12-02 ENCOUNTER — Telehealth: Payer: Self-pay | Admitting: Family Medicine

## 2013-12-02 NOTE — Telephone Encounter (Signed)
Wife called and said that [facility] did not receive a prescription for patient Duloxetine. Can we call this or send this again. jw

## 2013-12-02 NOTE — Telephone Encounter (Signed)
Sent in normal

## 2013-12-03 ENCOUNTER — Telehealth: Payer: Self-pay | Admitting: *Deleted

## 2013-12-03 NOTE — Discharge Summary (Signed)
Patient seen and examined on day of discharge, discussed with resident team and I agree with plan for discharge.  Paula Compton, MD

## 2013-12-03 NOTE — Telephone Encounter (Signed)
Unfortunately until Juan [name] decides to stop drinking, there's not much we can do to help with the other side effects he is experiencing.  The Ativan was provided to him to prevent withdrawals.  However, if he is drinking again, he will not be withdrawing and should not mix alcohol with the ativan.  It sounds like he needs at least outpatient, if not inpatient, rehab to help with alcohol cessation.  If his wife would like to speak to me directly, please have her provide a good number to reach her.

## 2013-12-03 NOTE — Telephone Encounter (Signed)
Received VM from wife on physicians line.  She states that her husband was discharged on Wednesday and since they have been home he has started to drink again and she has seen an immediate decline.  She states that she has picked up the ativan but has not given to him yet since he has been drinking.  She is very concerned about his lack of appetite, nausea, vomiting and nightmares.  She wants to know what to do for the nausea and when to give the ativan.  Will forward to MD. Milas Gain, Maryjo Rochester

## 2013-12-07 ENCOUNTER — Telehealth: Payer: Self-pay | Admitting: *Deleted

## 2013-12-07 NOTE — Telephone Encounter (Signed)
Not my patient. Think this was supposed to go to Dr. Gwendolyn Grant. Please forward to him.

## 2013-12-07 NOTE — Telephone Encounter (Signed)
Message left by [facility] needing clarification on directions and flavor for the Resource [place].  Please give [facility] a call at [phone].  Clovis Pu, RN

## 2013-12-08 MED ORDER — BOOST / RESOURCE BREEZE PO LIQD: 1.0000 | Freq: Three times a day (TID) | ORAL | Status: DC

## 2013-12-08 NOTE — Telephone Encounter (Signed)
Sent in new script.  Chocolate or whatever flavor patient desires.

## 2013-12-09 ENCOUNTER — Encounter (HOSPITAL_COMMUNITY): Payer: Self-pay | Admitting: *Deleted

## 2013-12-09 ENCOUNTER — Emergency Department (HOSPITAL_COMMUNITY)
Admission: EM | Admit: 2013-12-09 | Discharge: 2013-12-10 | Disposition: A | Discharge status: Home / Self Care | Payer: No Typology Code available for payment source | Attending: Emergency Medicine | Admitting: Emergency Medicine

## 2013-12-09 DIAGNOSIS — E871 Hypo-osmolality and hyponatremia: Secondary | ICD-10-CM | POA: Diagnosis not present

## 2013-12-09 DIAGNOSIS — I10 Essential (primary) hypertension: Secondary | ICD-10-CM | POA: Diagnosis not present

## 2013-12-09 DIAGNOSIS — Z79899 Other long term (current) drug therapy: Secondary | ICD-10-CM | POA: Diagnosis not present

## 2013-12-09 DIAGNOSIS — J45909 Unspecified asthma, uncomplicated: Secondary | ICD-10-CM | POA: Insufficient documentation

## 2013-12-09 DIAGNOSIS — F10920 Alcohol use, unspecified with intoxication, uncomplicated: Secondary | ICD-10-CM

## 2013-12-09 DIAGNOSIS — F329 Major depressive disorder, single episode, unspecified: Secondary | ICD-10-CM | POA: Insufficient documentation

## 2013-12-09 DIAGNOSIS — I509 Heart failure, unspecified: Secondary | ICD-10-CM | POA: Insufficient documentation

## 2013-12-09 DIAGNOSIS — F1010 Alcohol abuse, uncomplicated: Secondary | ICD-10-CM | POA: Insufficient documentation

## 2013-12-09 DIAGNOSIS — R000 Tachycardia, unspecified: Secondary | ICD-10-CM | POA: Insufficient documentation

## 2013-12-09 DIAGNOSIS — Z720 Tobacco use: Secondary | ICD-10-CM | POA: Insufficient documentation

## 2013-12-09 DIAGNOSIS — E785 Hyperlipidemia, unspecified: Secondary | ICD-10-CM | POA: Insufficient documentation

## 2013-12-09 DIAGNOSIS — Z7951 Long term (current) use of inhaled steroids: Secondary | ICD-10-CM | POA: Insufficient documentation

## 2013-12-09 DIAGNOSIS — Z7982 Long term (current) use of aspirin: Secondary | ICD-10-CM | POA: Diagnosis not present

## 2013-12-09 DIAGNOSIS — E860 Dehydration: Secondary | ICD-10-CM | POA: Diagnosis present

## 2013-12-09 DIAGNOSIS — F419 Anxiety disorder, unspecified: Secondary | ICD-10-CM | POA: Diagnosis not present

## 2013-12-09 LAB — BASIC METABOLIC PANEL
Anion gap: 17 — ABNORMAL HIGH (ref 5–15)
BUN: 14 mg/dL (ref 6–23)
CO2: 19 meq/L (ref 19–32)
CREATININE: 0.97 mg/dL (ref 0.50–1.35)
Calcium: 8 mg/dL — ABNORMAL LOW (ref 8.4–10.5)
Chloride: 91 mEq/L — ABNORMAL LOW (ref 96–112)
GFR calc Af Amer: >90 mL/min (ref >90)
GFR calc non Af Amer: 87 mL/min — ABNORMAL LOW (ref >90)
Glucose, Bld: 111 mg/dL — ABNORMAL HIGH (ref 70–99)
POTASSIUM: 4.7 meq/L (ref 3.7–5.3)
Sodium: 127 mEq/L — ABNORMAL LOW (ref 137–147)

## 2013-12-09 LAB — URINALYSIS, ROUTINE W REFLEX MICROSCOPIC
Bilirubin Urine: NEGATIVE
Glucose, UA: NEGATIVE mg/dL
HGB URINE DIPSTICK: NEGATIVE
KETONES UR: NEGATIVE mg/dL
Leukocytes, UA: NEGATIVE
Nitrite: NEGATIVE
PH: 5.5 (ref 5.0–8.0)
Protein, ur: 30 mg/dL — AB
SPECIFIC GRAVITY, URINE: 1.024 (ref 1.005–1.030)
Urobilinogen, UA: 0.2 mg/dL (ref 0.0–1.0)

## 2013-12-09 LAB — URINE MICROSCOPIC-ADD ON

## 2013-12-09 LAB — CBC WITH DIFFERENTIAL/PLATELET
Basophils Absolute: 0.1 10*3/uL (ref 0.0–0.1)
Basophils Relative: 1 % (ref 0–1)
EOS ABS: 0 10*3/uL (ref 0.0–0.7)
Eosinophils Relative: 1 % (ref 0–5)
HCT: 37.1 % — ABNORMAL LOW (ref 39.0–52.0)
Hemoglobin: 12.8 g/dL — ABNORMAL LOW (ref 13.0–17.0)
LYMPHS ABS: 0.9 10*3/uL (ref 0.7–4.0)
Lymphocytes Relative: 18 % (ref 12–46)
MCH: 31.8 pg (ref 26.0–34.0)
MCHC: 34.5 g/dL (ref 30.0–36.0)
MCV: 92.3 fL (ref 78.0–100.0)
MONOS PCT: 16 % — AB (ref 3–12)
Monocytes Absolute: 0.8 10*3/uL (ref 0.1–1.0)
NEUTROS PCT: 64 % (ref 43–77)
Neutro Abs: 3.3 10*3/uL (ref 1.7–7.7)
PLATELETS: 328 10*3/uL (ref 150–400)
RBC: 4.02 MIL/uL — AB (ref 4.22–5.81)
RDW: 14.2 % (ref 11.5–15.5)
WBC: 5 10*3/uL (ref 4.0–10.5)

## 2013-12-09 LAB — COMPREHENSIVE METABOLIC PANEL
ALBUMIN: 3.5 g/dL (ref 3.5–5.2)
ALT: 45 U/L (ref 0–53)
AST: 77 U/L — ABNORMAL HIGH (ref 0–37)
Alkaline Phosphatase: 114 U/L (ref 39–117)
Anion gap: 21 — ABNORMAL HIGH (ref 5–15)
BUN: 16 mg/dL (ref 6–23)
CHLORIDE: 90 meq/L — AB (ref 96–112)
CO2: 18 mEq/L — ABNORMAL LOW (ref 19–32)
Calcium: 8.4 mg/dL (ref 8.4–10.5)
Creatinine, Ser: 1.01 mg/dL (ref 0.50–1.35)
GFR calc non Af Amer: 78 mL/min — ABNORMAL LOW (ref >90)
GLUCOSE: 115 mg/dL — AB (ref 70–99)
POTASSIUM: 5.4 meq/L — AB (ref 3.7–5.3)
SODIUM: 129 meq/L — AB (ref 137–147)
TOTAL PROTEIN: 7.5 g/dL (ref 6.0–8.3)
Total Bilirubin: 0.8 mg/dL (ref 0.3–1.2)

## 2013-12-09 LAB — SALICYLATE LEVEL: Salicylate Lvl: <2 mg/dL — ABNORMAL LOW (ref 2.8–20.0)

## 2013-12-09 LAB — ETHANOL: Alcohol, Ethyl (B): <11 mg/dL (ref 0–11)

## 2013-12-09 LAB — ACETAMINOPHEN LEVEL: Acetaminophen (Tylenol), Serum: <15 ug/mL (ref 10–30)

## 2013-12-09 LAB — PANCREATIC ELASTASE, FECAL: PANCREATIC ELASTASE-1, STL: 359 ug/g

## 2013-12-09 MED ORDER — LORAZEPAM 2 MG/ML IJ SOLN: 0.0000 mg | Freq: Two times a day (BID) | INTRAMUSCULAR | Status: DC

## 2013-12-09 MED ORDER — ACETAMINOPHEN 325 MG PO TABS: 650.0000 mg | ORAL_TABLET | ORAL | Status: DC | PRN

## 2013-12-09 MED ORDER — IBUPROFEN 400 MG PO TABS: 600.0000 mg | ORAL_TABLET | Freq: Three times a day (TID) | ORAL | Status: DC | PRN

## 2013-12-09 MED ORDER — ALUM & MAG HYDROXIDE-SIMETH 200-200-20 MG/5ML PO SUSP
30.0000 mL | ORAL | Status: DC | PRN
  Administered 2013-12-09 – 2013-12-10 (×2): 30 mL via ORAL
  Filled 2013-12-09 (×2): qty 30

## 2013-12-09 MED ORDER — LORAZEPAM 2 MG/ML IJ SOLN
0.0000 mg | Freq: Four times a day (QID) | INTRAMUSCULAR | Status: DC
  Administered 2013-12-09 – 2013-12-10 (×2): 1 mg via INTRAVENOUS
  Filled 2013-12-09 (×2): qty 1

## 2013-12-09 MED ORDER — VITAMIN B-1 100 MG PO TABS
100.0000 mg | ORAL_TABLET | Freq: Every day | ORAL | Status: DC
  Administered 2013-12-10: 100 mg via ORAL
  Filled 2013-12-09: qty 1

## 2013-12-09 MED ORDER — ZOLPIDEM TARTRATE 5 MG PO TABS
5.0000 mg | ORAL_TABLET | Freq: Every evening | ORAL | Status: DC | PRN
  Administered 2013-12-09: 5 mg via ORAL
  Filled 2013-12-09: qty 1

## 2013-12-09 MED ORDER — NICOTINE 21 MG/24HR TD PT24
21.0000 mg | MEDICATED_PATCH | Freq: Every day | TRANSDERMAL | Status: DC
  Administered 2013-12-09 – 2013-12-10 (×2): 21 mg via TRANSDERMAL
  Filled 2013-12-09 (×2): qty 1

## 2013-12-09 MED ORDER — ONDANSETRON HCL 4 MG PO TABS: 4.0000 mg | ORAL_TABLET | Freq: Three times a day (TID) | ORAL | Status: DC | PRN

## 2013-12-09 MED ORDER — SODIUM CHLORIDE 0.9 % IV BOLUS (SEPSIS)
1000.0000 mL | Freq: Once | INTRAVENOUS | Status: AC
  Administered 2013-12-09: 1000 mL via INTRAVENOUS

## 2013-12-09 MED ORDER — THIAMINE HCL 100 MG/ML IJ SOLN
100.0000 mg | Freq: Every day | INTRAMUSCULAR | Status: DC
  Administered 2013-12-09: 100 mg via INTRAVENOUS
  Filled 2013-12-09: qty 2

## 2013-12-09 MED ORDER — LORAZEPAM 1 MG PO TABS: 1.0000 mg | ORAL_TABLET | Freq: Three times a day (TID) | ORAL | Status: DC | PRN

## 2013-12-09 MED ORDER — ONDANSETRON HCL 4 MG/2ML IJ SOLN
4.0000 mg | Freq: Once | INTRAMUSCULAR | Status: AC
  Administered 2013-12-09: 4 mg via INTRAVENOUS
  Filled 2013-12-09: qty 2

## 2013-12-09 NOTE — ED Provider Notes (Signed)
CSN: [id number]     Arrival date & time 12/09/13  1033 History   First MD Initiated Contact with Patient 12/09/13 1607     Chief Complaint  Patient presents with  . Dehydration     (Consider location/radiation/quality/duration/timing/severity/associated sxs/prior Treatment) HPI [name] is a 63 year old malewith past medical history of CHF, alcoholism, anxiety, depression, hypertension, hyperlipidemia, seizures who presents to the ER today for medical clearancefor a detox program from alcohol. Patient reports that he drinks approximately 1-2 gallons of bourbon a week, and wishes to stop drinking. Patient states he went to [facility] today to check in for detox program, however [facility] requested that patient go to the ER first for evaluation and medical clearance. Patient states that he has been experiencing 4 days of nausea with anorexia.patient states he has been unable to eat for approximately 4 days. Patient states his last drink of alcohol was at 12 midnight last night. Patient denies any dizziness, weakness, blurred vision, chest pain, shortness of breath on the syncope, abdominal pain, vomiting, diarrhea, dysuria. Patient denies suicidal or homicidal ideation.patient reports having a suprapubic hernia which is stable, baseline for him, and he reports he is being followed by PCP and GI for it.  Past Medical History  Diagnosis Date  . CHF (congestive heart failure) 10/2010    ECHO:  EF 40%, Grade II diastolic dysfunction  . Alcoholism   . Allergy     seasonal   . Anxiety   . Depression   . Asthma   . Hyperlipidemia   . Hypertension   . Seizures    Past Surgical History  Procedure Laterality Date  . Removal of ingrown toenail     Family History  Problem Relation Age of Onset  . Alzheimer's disease Mother   . Diabetes Mother   . Heart disease Maternal Uncle    History  Substance Use Topics  . Smoking status: Current Every Day Smoker -- 1.00 packs/day for 40  years    Types: Cigarettes  . Smokeless tobacco: Never Used     Comment: currently smokes less than 1 ppd  . Alcohol Use: Yes     Comment: 4-5 burbon's a day for the past 15 years    Review of Systems  Constitutional: Negative for fever.  HENT: Negative for trouble swallowing.   Eyes: Negative for visual disturbance.  Respiratory: Negative for shortness of breath.   Cardiovascular: Negative for chest pain.  Gastrointestinal: Positive for nausea. Negative for vomiting and abdominal pain.  Genitourinary: Negative for dysuria.  Musculoskeletal: Negative for neck pain.  Skin: Negative for rash.  Neurological: Negative for dizziness, weakness and numbness.  Psychiatric/Behavioral: Negative for suicidal ideas.       Alcohol dependence      Allergies  Review of patient's allergies indicates no known allergies.  Home Medications   Prior to Admission medications   Medication Sig Start Date End Date Taking? Authorizing Provider  acetaminophen (TYLENOL) 500 MG tablet Take 1,500 mg by mouth every 6 (six) hours as needed for headache (headache).    Yes Historical Provider, MD  aspirin EC 81 MG EC tablet Take 1 tablet (81 mg total) by mouth daily. 11/13/12  Yes Renae FickleMackenzie Short, MD  atorvastatin (LIPITOR) 40 MG tablet Take 1 tablet (40 mg total) by mouth daily at 6 PM. Need appointment for future refills. 11/02/13  Yes Chrystie NoseKenneth C. Hilty, MD  B-Complex CAPS Take 1 capsule by mouth daily.   Yes Historical Provider, MD  bismuth subsalicylate (  PEPTO BISMOL) 262 MG/15ML suspension Take 30 mLs by mouth every 6 (six) hours as needed for diarrhea or loose stools.   Yes Historical Provider, MD  carvedilol (COREG) 6.25 MG tablet Take 1 tablet (6.25 mg total) by mouth 2 (two) times daily with a meal. <please schedule appointment for future refills> 09/21/13  Yes Chrystie Nose, MD  Cholecalciferol (VITAMIN D-3) 1000 UNITS CAPS Take 1,000 Units by mouth daily.   Yes Historical Provider, MD  clonazePAM  (KLONOPIN) 0.5 MG tablet Take 1 tablet (0.5 mg total) by mouth 2 (two) times daily as needed for anxiety (anxiety). 12/01/13  Yes Pincus Large, DO  Ephedrine-Guaifenesin (PRIMATENE ASTHMA) 12.5-200 MG TABS Take 2 tablets by mouth daily as needed (for shortness of breath).   Yes Historical Provider, MD  EPINEPHrine HCl (ASTHMANEFRIN IN) Inhale 2 puffs into the lungs 3 (three) times daily as needed (Shortness of breath).   Yes Historical Provider, MD  fluticasone Aleda Grana) 50 MCG/ACT nasal spray Use two sprays in each nostril daily 11/08/13  Yes Tobey Grim, MD  furosemide (LASIX) 40 MG tablet Take 1 tablet (40 mg total) by mouth daily. Need appointment for future refills. 11/02/13  Yes Chrystie Nose, MD  ipratropium (ATROVENT HFA) 17 MCG/ACT inhaler Inhale 2 puffs into the lungs 3 (three) times daily as needed for wheezing (wheezing).    Yes Historical Provider, MD  Ipratropium-Albuterol (COMBIVENT RESPIMAT) 20-100 MCG/ACT AERS respimat Inhale 2 puffs into the lungs every 6 (six) hours as needed for wheezing (wheezing).   Yes Historical Provider, MD  lisinopril (PRINIVIL,ZESTRIL) 20 MG tablet Take 20 mg by mouth daily.   Yes Historical Provider, MD  montelukast (SINGULAIR) 10 MG tablet Take 1 tablet (10 mg total) by mouth at bedtime. 10/26/13  Yes Tobey Grim, MD  Multiple Vitamin (MULTIVITAMIN WITH MINERALS) TABS tablet Take 1 tablet by mouth daily.   Yes Historical Provider, MD  potassium chloride SA (K-DUR,KLOR-CON) 20 MEQ tablet Take 2 tablets (40 mEq total) by mouth daily. 12/01/12  Yes Chrystie Nose, MD  spironolactone (ALDACTONE) 25 MG tablet Take 0.5 tablets (12.5 mg total) by mouth daily. Need appointment for future refills. 11/02/13  Yes Chrystie Nose, MD  tiotropium (SPIRIVA) 18 MCG inhalation capsule Place 1 capsule (18 mcg total) into inhaler and inhale daily. 06/17/13  Yes Kathlen Mody, MD  triamcinolone (KENALOG) 0.025 % ointment Apply 1 application topically 2 (two) times  daily. 10/26/13  Yes Tobey Grim, MD  dicyclomine (BENTYL) 20 MG tablet Take 1 tablet (20 mg total) by mouth 3 (three) times daily before meals. 12/01/13   Pincus Large, DO  DULoxetine 40 MG CPEP Take 40 mg by mouth daily. 12/02/13   Pincus Large, DO  feeding supplement, RESOURCE BREEZE, (RESOURCE BREEZE) LIQD Take 1 Container by mouth 3 (three) times daily between meals. Chocolate or whatever flavor desired by patient 12/08/13   Tobey Grim, MD  gabapentin (NEURONTIN) 300 MG capsule Take 1 capsule (300 mg total) by mouth 3 (three) times daily. 06/17/13   Kathlen Mody, MD  LORazepam (ATIVAN) 1 MG tablet Take 1 tablet (1 mg total) by mouth every 8 (eight) hours. 12/01/13   Jazma Orbie Pyo, DO   BP 172/92 mmHg  Pulse 103  Temp(Src) 99 F (37.2 C) (Oral)  Resp 18  SpO2 98% Physical Exam  Constitutional: He appears well-developed and well-nourished. No distress.  HENT:  Head: Normocephalic and atraumatic.  Mouth/Throat: Oropharynx is clear and moist. No  oropharyngeal exudate.  Eyes: Right eye exhibits no discharge. Left eye exhibits no discharge. No scleral icterus.  Neck: Normal range of motion.  Cardiovascular: Regular rhythm, S1 normal, S2 normal and normal heart sounds.  Tachycardia present.   No murmur heard. Pulses:      Radial pulses are 2+ on the right side, and 2+ on the left side.  Tachycardic at 110on exam.  Pulmonary/Chest: Effort normal and breath sounds normal. No accessory muscle usage. No tachypnea. No respiratory distress.  Abdominal: Soft. Normal appearance and bowel sounds are normal. There is no tenderness.  Patient has a large incarcerated hernia in his suprapubic region approximately 10 cm in diameter.   Musculoskeletal: Normal range of motion. He exhibits no edema or tenderness.  Neurological: He is alert. He has normal strength. No cranial nerve deficit. Coordination normal. GCS eye subscore is 4. GCS verbal subscore is 5. GCS motor subscore is 6.  Patient  fully alert answering questions appropriately in full, clear sentences.cranial nerves II through XII grossly intact. Motor strength 5 out of 5 in all major muscle groups of upper and lower extremities.  Skin: Skin is warm and dry. No rash noted. He is not diaphoretic.  Psychiatric:  Patient's mood is depressed with affect flat. Speech is normal and coherent. Behavior is slowed, however appropriate. Thought content revolves around patient's perception of his problem with alcohol, and his desire to stop drinking. Judgment and insight are appropriate. Patient denies suicidal or homicidal ideation.  Nursing note and vitals reviewed.   ED Course  Procedures (including critical care time) Labs Review Labs Reviewed  CBC WITH DIFFERENTIAL - Abnormal; Notable for the following:    RBC 4.02 (*)    Hemoglobin 12.8 (*)    HCT 37.1 (*)    Monocytes Relative 16 (*)    All other components within normal limits  COMPREHENSIVE METABOLIC PANEL - Abnormal; Notable for the following:    Sodium 129 (*)    Potassium 5.4 (*)    Chloride 90 (*)    CO2 18 (*)    Glucose, Bld 115 (*)    AST 77 (*)    GFR calc non Af Amer 78 (*)    Anion gap 21 (*)    All other components within normal limits  URINALYSIS, ROUTINE W REFLEX MICROSCOPIC - Abnormal; Notable for the following:    Color, Urine AMBER (*)    APPearance CLOUDY (*)    Protein, ur 30 (*)    All other components within normal limits  SALICYLATE LEVEL - Abnormal; Notable for the following:    Salicylate Lvl <2.0 (*)    All other components within normal limits  BASIC METABOLIC PANEL - Abnormal; Notable for the following:    Sodium 127 (*)    Chloride 91 (*)    Glucose, Bld 111 (*)    Calcium 8.0 (*)    GFR calc non Af Amer 87 (*)    Anion gap 17 (*)    All other components within normal limits  URINE MICROSCOPIC-ADD ON - Abnormal; Notable for the following:    Casts HYALINE CASTS (*)    All other components within normal limits  URINE RAPID  DRUG SCREEN (HOSP PERFORMED)  ETHANOL  ACETAMINOPHEN LEVEL    Imaging Review No results found.   EKG Interpretation   Date/Time:  Thursday December 09 2013 16:52:45 EST Ventricular Rate:  103 PR Interval:  186 QRS Duration: 132 QT Interval:  362 QTC Calculation: 474 R Axis:   -  149 Text Interpretation:  Sinus tachycardia Right bundle branch block Abnormal  ECG Since last tracing PR  interval is shortened and now normal Confirmed  by [facility]  MD, ELLIOTT [phone]) on 12/09/2013 5:32:24 PM      MDM   Final diagnoses:  None    Patient here for medical clearance to go to alcohol detox program. Patient stating that he drinks approximately 1-2 gallons of bourbon a week, we'll place patient on CIWA protocol and consult TTS.  Will treat symptomatically, and give patient fluid boluses. Patient noted to be mildly hyponatremic, and mildly hyperkalemic. We will reassess BM P status post fluid boluses.  BNP noted with normal potassium level. Patient still with mild hyponatremia, we'll continue fluids.patient's other labs unremarkable for any acute pathology.  BP 172/92 mmHg  Pulse 103  Temp(Src) 99 F (37.2 C) (Oral)  Resp 18  SpO2 98%  Signed,  Ladona Mow, PA-C 1:37 AM  Patient discussed with Dr. Mancel Bale, M.D.   Juan Fantasia, PA-C 12/10/13 2878  Flint Melter, MD 12/10/13 [id number]

## 2013-12-09 NOTE — ED Notes (Signed)
Pt was sent here due to concern of dehydration, states he went to check in to a rehab facility and told them that he had vomited in the last few days and wasn't eating, no distress noted

## 2013-12-09 NOTE — ED Notes (Signed)
pts wife states that she is concerned regarding pts alcohol withdrawal. States that he has had seizures in the past due to ETOH withdrawal. Pt states that pt has been diaphoretic and weak.

## 2013-12-09 NOTE — ED Notes (Signed)
Contacted EDP. Currently with critical pt.

## 2013-12-09 NOTE — ED Notes (Signed)
Spoke with EDP verbal order BMET. Patient notified.

## 2013-12-09 NOTE — ED Notes (Signed)
Behavioral Health called back stated will not have to use the tele psych computer.  Patient was evaluated at [facility] and does not need another evaluation.  Needs to be medically cleared and if is will possibly sent to [facility] tomorrow morning.

## 2013-12-09 NOTE — ED Notes (Signed)
Behavioral Health called stated to place tele psych computer into room.

## 2013-12-09 NOTE — ED Notes (Signed)
Pt belongings inventoried and placed in box c-27 in POD C.

## 2013-12-09 NOTE — BH Assessment (Signed)
TTS spoke with Dr Effie Shy who reports patient was at [facility] earlier today and was sent to ED due to his reports of vomiting. TTS following up to determine if patient has bed at [facility] before completing an assessment as of 18:11 Spoke with admissions at 18:20 who report they are familiar with patient. Contact with [facility] of Nursing, Laurel Dimmer at [name] [phone] will be needed before patient can return there for admit. Unfortunately Nurse Elenore Paddy has left for the day.  Shared information with Dr Effie Shy who is familiar with this facility's process and clearance needs; in agreement there is no need for assessment. TTS contacted patient's RN, Tammy Sours, and updated as he had agreed to have TTS cart in room which is no longer required.  Carney Bern, LCSW

## 2013-12-09 NOTE — ED Notes (Signed)
Patient tolerated [place] sandwich, apple sauce, and drink without incident.

## 2013-12-09 NOTE — ED Notes (Signed)
Patient unable to urinate at this time and stated will try shortly.

## 2013-12-10 ENCOUNTER — Telehealth: Payer: Self-pay | Admitting: Family Medicine

## 2013-12-10 ENCOUNTER — Encounter: Payer: Self-pay | Admitting: Family Medicine

## 2013-12-10 ENCOUNTER — Inpatient Hospital Stay: Payer: Self-pay | Admitting: Family Medicine

## 2013-12-10 LAB — RAPID URINE DRUG SCREEN, HOSP PERFORMED
AMPHETAMINES: NOT DETECTED
Barbiturates: NOT DETECTED
Benzodiazepines: NOT DETECTED
COCAINE: NOT DETECTED
Opiates: NOT DETECTED
TETRAHYDROCANNABINOL: NOT DETECTED

## 2013-12-10 MED ORDER — LORAZEPAM 1 MG PO TABS: 0.0000 mg | ORAL_TABLET | Freq: Two times a day (BID) | ORAL | Status: DC

## 2013-12-10 MED ORDER — LORAZEPAM 1 MG PO TABS
0.0000 mg | ORAL_TABLET | Freq: Four times a day (QID) | ORAL | Status: DC
  Administered 2013-12-10: 1 mg via ORAL
  Filled 2013-12-10: qty 1

## 2013-12-10 MED ORDER — LORAZEPAM 1 MG PO TABS: 1.0000 mg | ORAL_TABLET | Freq: Three times a day (TID) | ORAL | Status: DC | PRN

## 2013-12-10 NOTE — ED Notes (Signed)
Patient belongings returned to patient and family. Pt given prescription for ativan. Pt wheeled and assisted to wife's care. sts will [facility] with [facility] on Monday and will use additional resources as needed. Patient alert and oriented. In no acute distress.

## 2013-12-10 NOTE — ED Notes (Signed)
Spoke to [employer]-  Wife has set up appointment outpatient at [facility] on Monday, [facility] can keep pt for observation but there is currently no room for any detox center. Spoke to wife to update her.

## 2013-12-10 NOTE — BH Assessment (Signed)
Tele Assessment Note   Juan [name] is an 63 y.o. male referred to [facility] ED by [facility]. Pt reported that he has been unable to eat for several weeks and now he feels weak. Pt reported that he has been an alcoholic for many years.  Pt denies SI, HI and AVH at this time. Pt did not report any previous suicide attempts or psychiatric hospitalizations. Pt did not reported any prior detox history but shared that he is able to maintain his sobriety for 2 weeks. Pt reported that he drinks daily and it is equivalent to "3 -1/2 gallons of bourbon" in a week. PT did not report any illicit substance use disorder. PT reported that he has been having problems falling asleep and his appetite has been poor. Pt also reported additional depressive symptoms and pt reported that the loss of his job in 2012 is quite stressful. Pt reported that he has access to hunting and target practice guns but reported that they are locked away. Pt did not report any physical, emotional or sexual abuse at this time.  Pt is alert and oriented x3. Pt is calm and cooperative at this time. Pt maintained good eye contact throughout this assessment. Pt speech is normal and his thought process is coherent and relevant. Pt mood is euthymic and his affect is congruent with his mood. Pt reported that he lives at home with his wife and he counts on her for support.   Axis I: Alcohol Use Disorder, Moderate  Past Medical History:  Past Medical History  Diagnosis Date  . CHF (congestive heart failure) 10/2010    ECHO:  EF 40%, Grade II diastolic dysfunction  . Alcoholism   . Allergy     seasonal   . Anxiety   . Depression   . Asthma   . Hyperlipidemia   . Hypertension   . Seizures     Past Surgical History  Procedure Laterality Date  . Removal of ingrown toenail      Family History:  Family History  Problem Relation Age of Onset  . Alzheimer's disease Mother   . Diabetes Mother   . Heart disease Maternal Uncle      Social History:  reports that he has been smoking Cigarettes.  He has a 40 pack-year smoking history. He has never used smokeless tobacco. He reports that he drinks alcohol. He reports that he does not use illicit drugs.  Additional Social History:  Alcohol / Drug Use History of alcohol / drug use?: Yes Substance #1 Name of Substance 1: Alcohol  1 - Age of First Use: 20 1 - Amount (size/oz): "3-1/2 gallons of bourbon per week" 1 - Frequency: daily  1 - Duration: ongoing  1 - Last Use / Amount: Midnight 12-09-13  CIWA: CIWA-Ar BP: 138/75 mmHg Pulse Rate: 112 Nausea and Vomiting: no nausea and no vomiting Tactile Disturbances: none Tremor: moderate, with patient's arms extended Auditory Disturbances: not present Paroxysmal Sweats: no sweat visible Visual Disturbances: not present Anxiety: mildly anxious Headache, Fullness in Head: none present Agitation: normal activity Orientation and Clouding of Sensorium: oriented and can do serial additions CIWA-Ar Total: 5 COWS:    PATIENT STRENGTHS: (choose at least two) Motivation for treatment/growth Supportive family/friends    Allergies: No Known Allergies  Home Medications:  (Not in a hospital admission)  OB/GYN Status:  No LMP for male patient.  General Assessment Data Location of Assessment: [facility] ED Is this a Tele or Face-to-Face Assessment?: Tele Assessment Is  this an Initial Assessment or a Re-assessment for this encounter?: Initial Assessment Living Arrangements: Spouse/significant other Can pt return to current living arrangement?: Yes Admission Status: Voluntary Is patient capable of signing voluntary admission?: Yes Transfer from: Home Referral Source: Self/Family/Friend     [facility] Living Arrangements: Spouse/significant other Name of Psychiatrist: Dr. Jennelle Humanottle  Name of Therapist: No provider reported  Education Status Is patient currently in school?: No  Risk to self with the past 6  months Suicidal Ideation: No Suicidal Intent: No Is patient at risk for suicide?: No Suicidal Plan?: No Access to Means: No What has been your use of drugs/alcohol within the last 12 months?: Daily alcohol use reported.  Previous Attempts/Gestures: No How many times?: 0 Other Self Harm Risks: No other self harm risk identified at this time.  Triggers for Past Attempts: None known Intentional Self Injurious Behavior: None Family Suicide History: No Recent stressful life event(s): Job Loss Persecutory voices/beliefs?: No Depression: Yes Depression Symptoms: Tearfulness, Fatigue, Guilt, Feeling worthless/self pity, Feeling angry/irritable Substance abuse history and/or treatment for substance abuse?: Yes Suicide prevention information given to non-admitted patients: Not applicable  Risk to Others within the past 6 months Homicidal Ideation: No Thoughts of Harm to Others: No Current Homicidal Intent: No Current Homicidal Plan: No Access to Homicidal Means: No Identified Victim: NA History of harm to others?: No Assessment of Violence: None Noted Violent Behavior Description: No violent behaviors observed.  Does patient have access to weapons?: Yes (Comment) ("Hunting guns and target practice guns". ) Criminal Charges Pending?: No Does patient have a court date: No  Psychosis Hallucinations: None noted Delusions: None noted  Mental Status Report Appear/Hygiene: In scrubs, Disheveled Eye Contact: Good Motor Activity: Freedom of movement Speech: Logical/coherent Level of Consciousness: Quiet/awake Mood: Euthymic Affect: Appropriate to circumstance Anxiety Level: None Thought Processes: Coherent, Relevant Judgement: Unimpaired Orientation: Person, Place, Time, Situation Obsessive Compulsive Thoughts/Behaviors: None  Cognitive Functioning Concentration: Normal Memory: Recent Intact IQ: Average Insight: Fair Impulse Control: Fair Appetite: Poor Weight Loss: 0 Sleep:  Decreased Total Hours of Sleep: 5 Vegetative Symptoms: Staying in bed, Decreased grooming  ADLScreening [facility] Assessment Services) Patient's cognitive ability adequate to safely complete daily activities?: Yes Patient able to express need for assistance with ADLs?: Yes Independently performs ADLs?: Yes (appropriate for developmental age)  Prior Inpatient Therapy Prior Inpatient Therapy: No  Prior Outpatient Therapy Prior Outpatient Therapy: Yes Prior Therapy Dates: 2012-present  Prior Therapy Facilty/Provider(s): Dr. Jennelle Humanottle '  ADL Screening (condition at time of admission) Patient's cognitive ability adequate to safely complete daily activities?: Yes Patient able to express need for assistance with ADLs?: Yes Independently performs ADLs?: Yes (appropriate for developmental age)       Abuse/Neglect Assessment (Assessment to be complete while patient is alone) Physical Abuse: Denies Verbal Abuse: Denies Sexual Abuse: Denies Exploitation of patient/patient's resources: Denies Self-Neglect: Denies     Merchant navy officerAdvance Directives (For Healthcare) Would patient like information on creating an advanced directive?: No - patient declined information    Additional Information 1:1 In Past 12 Months?: No Elopement Risk: No Does patient have medical clearance?: Yes     Disposition:  Disposition Initial Assessment Completed for this Encounter: Yes Disposition of Patient: Inpatient treatment program Type of inpatient treatment program: Adult ([facility]  Giulia Hickey S 12/10/2013 5:46 AM

## 2013-12-10 NOTE — Progress Notes (Signed)
Placed in PCP box for completion 

## 2013-12-10 NOTE — BH Assessment (Signed)
Assessment completed. Inpatient treatment recommended. Dr. Rhunette Croft has been informed of the recommendation.

## 2013-12-10 NOTE — Discharge Instructions (Signed)
Follow up as directed by behavioral health and social work, Gaffer.  If you were given medicines take as directed.  If you are on coumadin or contraceptives realize their levels and effectiveness is altered by many different medicines.  If you have any reaction (rash, tongues swelling, other) to the medicines stop taking and see a physician.   Please follow up as directed and return to the ER or see a physician for new or worsening symptoms.  Thank you. Filed Vitals:   12/10/13 0533 12/10/13 0546 12/10/13 0944 12/10/13 1128  BP: 138/75 138/75 145/84 150/90  Pulse: 112 112 111 97  Temp: 98.8 F (37.1 C)  98.7 F (37.1 C) 98.5 F (36.9 C)  TempSrc: Oral  Oral Oral  Resp: 17  18 111  SpO2: 98%  98% 97%

## 2013-12-10 NOTE — Progress Notes (Signed)
Juan [name] has left FMLA documents that she need completed by her husband's provider to give to her employer.  Please contact her when ready for pickup

## 2013-12-10 NOTE — ED Notes (Signed)
Telepsych in progress. 

## 2013-12-10 NOTE — ED Notes (Signed)
Spoke with social work again, updated her on issues regarding hernia.  She is trying to obtain placement at different facilities.  Social worker states it may be a possibility that patient is discharged and receive treatment on an outpatient basis because of bed availability.  Wife at bedside, asked social worker to update patient's wife on status of finding placement, patient consented to sharing medical information with his wife.

## 2013-12-10 NOTE — ED Notes (Signed)
Pt reports going to [facility] this morning for detox. [facility] sent patient to ED for medical clearance for concerns of dehydration. Pt and wife were given resources for more detox programs, in which pt reports he and his wife are going to talk in the morning for other possible detox options. Pt normally drinks "three large glasses of bourbon" a day with last drink "26 hours ago." When asked if pt has completed detox in the past, pt responded "not really." Tremors noted on assessment. Pt A&Ox4, no complaints at this time

## 2013-12-10 NOTE — ED Notes (Signed)
Spoke with Juan [name] from SW: [facility] declined [facility] due to concerns over "large strangulated hernia that is bleeding all the time" and wants patient to have this corrected before they will admit patient.    I spoke with patient regarding this, patient states he prefers not to go to [name], as he was not pleased with services there.    Regarding the hernia: Hernia does not hurt.  States he was x-rayed last week and they told him that it was fat containing and stable.  Denies pain from hernia.  Hernia has been there since 2007, started small and grew after he had a coughing fit one time after he retired.  I assessed hernia, there is a large periumbilical hernia that is soft, measuring approx 5 cm or so with minimal skin breakdown at the top that looks to be about 1 cm.  No active bleeding.  Patient had a CT scan on 11/18/13 that showed "large stable fat containing umbilical hernia measuring around 4.9 cm."  I spoke with Dr. Jodi Mourning, he will evaluate patient shortly.

## 2013-12-10 NOTE — Progress Notes (Signed)
Pt gave permission to SW to speak with his wife, [name], regarding detox programs. In conversation it was determined that pt is requesting long term substance abuse treatment from alcohol. SW faxed information clinicals to area facilities, which have no openings. Pt.'s wife scheduled an appointment at the [facility] for oupt services on 12/13/13. SW shared with pt.'s wife that the [facility] has an intensive outpt substance abuse program that pt could scheduled to be seen. SW faxed information to Pod C RN to give to family. SW spoke with EDP, Jodi Mourning regarding conversation and plan with pt/family. Pt to be discharged.   Derrell Lolling, MSW  Social Worker [phone]

## 2013-12-10 NOTE — ED Provider Notes (Signed)
Patient presented primarily for alcohol detox to the ER and was observed with improved symptoms. Patient requesting to go home, behavior health and social work evaluated and together they came up with a plan for outpatient treatment. I discussed this with the patient and his comfortable with this plan and requesting to go home. Patient has capacity to make decisions, patient not suicidal, patient has low-sodium however his had before and I instructed repeat electrolyte check in the next week. I gave him a prescription for a few days of Ativan when necessary for withdrawal symptoms. Filed Vitals:   12/10/13 0533 12/10/13 0546 12/10/13 0944 12/10/13 1128  BP: 138/75 138/75 145/84 150/90  Pulse: 112 112 111 97  Temp: 98.8 F (37.1 C)  98.7 F (37.1 C) 98.5 F (36.9 C)  TempSrc: Oral  Oral Oral  Resp: 17  18 111  SpO2: 98%  98% 97%   Alcohol abuse, hyponatremia  Enid Skeens, MD 12/10/13 1240

## 2013-12-13 ENCOUNTER — Telehealth: Payer: Self-pay | Admitting: *Deleted

## 2013-12-13 NOTE — Telephone Encounter (Signed)
Juan [name] from [facility] called to request a change in medication.  DULoxetine 40 mg once daily was ordered, but it does not come in 40 mg.  Can it be changed to 20 mg take 2 tabs daily? Please give [facility] a called back at [phone].  Clovis Pu, RN

## 2013-12-14 ENCOUNTER — Encounter: Payer: Self-pay | Admitting: Family Medicine

## 2013-12-14 ENCOUNTER — Ambulatory Visit (INDEPENDENT_AMBULATORY_CARE_PROVIDER_SITE_OTHER): Payer: No Typology Code available for payment source | Admitting: Family Medicine

## 2013-12-14 VITALS — BP 118/76 | HR 100 | Temp 98.4°F | Ht 68.0 in | Wt 183.5 lb

## 2013-12-14 DIAGNOSIS — F17210 Nicotine dependence, cigarettes, uncomplicated: Secondary | ICD-10-CM

## 2013-12-14 DIAGNOSIS — F329 Major depressive disorder, single episode, unspecified: Secondary | ICD-10-CM

## 2013-12-14 DIAGNOSIS — G629 Polyneuropathy, unspecified: Secondary | ICD-10-CM

## 2013-12-14 DIAGNOSIS — F32A Depression, unspecified: Secondary | ICD-10-CM

## 2013-12-14 DIAGNOSIS — K429 Umbilical hernia without obstruction or gangrene: Secondary | ICD-10-CM

## 2013-12-14 DIAGNOSIS — F1010 Alcohol abuse, uncomplicated: Secondary | ICD-10-CM

## 2013-12-14 DIAGNOSIS — F17200 Nicotine dependence, unspecified, uncomplicated: Secondary | ICD-10-CM

## 2013-12-14 MED ORDER — GABAPENTIN 300 MG PO CAPS: 300.0000 mg | ORAL_CAPSULE | Freq: Three times a day (TID) | ORAL | Status: DC

## 2013-12-14 MED ORDER — DULOXETINE HCL 30 MG PO CPEP: 30.0000 mg | ORAL_CAPSULE | Freq: Every day | ORAL | Status: DC

## 2013-12-14 NOTE — Telephone Encounter (Signed)
He will be at appt today.  Will discuss with him then.

## 2013-12-14 NOTE — Progress Notes (Signed)
Can complete at visit today.

## 2013-12-14 NOTE — Patient Instructions (Signed)
Keep taking the Ativan every 8 hours until gone.  At that point, switch to 2 pills (1 mg) of Klonopin for 5 days twice a day.    After that, drop down to 1 pil (0.5 mg) of Klonopin twice a day like you have been.  Refill for Gabapentin today.  Cymbalta 30 mg daily.  Call if you have any questions or concerns.  The holidays can be a very difficult time when dealing with alcohol.   It was good to see you.  Have a Happy Thanksgiving.

## 2013-12-14 NOTE — Progress Notes (Signed)
Subjective:    Juan BernheimJames K Blasdell is a 63 y.o. male who [facility] FU:  1.  Alcoholism:  Another recent admit.  EtOH free for about 5 days.  Since being sent home from the hospital, he has been in the ED multiple times for alcohol detoxification.  Wife present today, surprisingly not overwhelmed with everything going on.  However she is having to take time away from work to care for her husband.  They have declined any resources at this time.  No DT's currently, tremulousness, etc.  No hallucinations.   2.  Depression:  Started on Cymbalta last admit.  Was prescribed at odd dose of 40 mg daily rather than 30 or 60.  Placed on 30 for 2-3 days then increased to 40 mg without reason provided.  Patient admits to depression.  Denies SI/HI.  Has not been taking any meds since pharmacy didn't fill 40 mg dose.    3.  Leg pain:  Neurological pain BL legs.  Out of Gabapentin for some time, desires refills today.  States this also helps him sleep. Describes as burning and occasional cramping lateral aspect of both legs. Declines any bladder/bowel incontience.  Has chronic lumbago but none past several days.    ROS as above per HPI, otherwise neg.    The following portions of the patient's history were reviewed and updated as appropriate: allergies, current medications, past medical history, family and social history, and problem list. Patient is a nonsmoker.    PMH reviewed.  Past Medical History  Diagnosis Date  . CHF (congestive heart failure) 10/2010    ECHO:  EF 40%, Grade II diastolic dysfunction  . Alcoholism   . Allergy     seasonal   . Anxiety   . Depression   . Asthma   . Hyperlipidemia   . Hypertension   . Seizures    Past Surgical History  Procedure Laterality Date  . Removal of ingrown toenail      Medications reviewed. Current Outpatient Prescriptions  Medication Sig Dispense Refill  . acetaminophen (TYLENOL) 500 MG tablet Take 1,500 mg by mouth every 6  (six) hours as needed for headache (headache).     Marland Kitchen. aspirin EC 81 MG EC tablet Take 1 tablet (81 mg total) by mouth daily. 90 tablet 0  . atorvastatin (LIPITOR) 40 MG tablet Take 1 tablet (40 mg total) by mouth daily at 6 PM. Need appointment for future refills. 30 tablet 0  . B-Complex CAPS Take 1 capsule by mouth daily.    Marland Kitchen. bismuth subsalicylate (PEPTO BISMOL) 262 MG/15ML suspension Take 30 mLs by mouth every 6 (six) hours as needed for diarrhea or loose stools.    . carvedilol (COREG) 6.25 MG tablet Take 1 tablet (6.25 mg total) by mouth 2 (two) times daily with a meal. <please schedule appointment for future refills> 60 tablet 2  . Cholecalciferol (VITAMIN D-3) 1000 UNITS CAPS Take 1,000 Units by mouth daily.    . clonazePAM (KLONOPIN) 0.5 MG tablet Take 1 tablet (0.5 mg total) by mouth 2 (two) times daily as needed for anxiety (anxiety). 30 tablet 0  . dicyclomine (BENTYL) 20 MG tablet Take 1 tablet (20 mg total) by mouth 3 (three) times daily before meals. 90 tablet 0  . DULoxetine 40 MG CPEP Take 40 mg by mouth daily. 30 capsule 3  . Ephedrine-Guaifenesin (PRIMATENE ASTHMA) 12.5-200 MG TABS Take 2 tablets by mouth daily as needed (for shortness of breath).    .Marland Kitchen  EPINEPHrine HCl (ASTHMANEFRIN IN) Inhale 2 puffs into the lungs 3 (three) times daily as needed (Shortness of breath).    . feeding supplement, RESOURCE BREEZE, (RESOURCE BREEZE) LIQD Take 1 Container by mouth 3 (three) times daily between meals. Chocolate or whatever flavor desired by patient 237 mL 0  . fluticasone (FLONASE) 50 MCG/ACT nasal spray Use two sprays in each nostril daily 16 g 11  . furosemide (LASIX) 40 MG tablet Take 1 tablet (40 mg total) by mouth daily. Need appointment for future refills. 30 tablet 0  . gabapentin (NEURONTIN) 300 MG capsule Take 1 capsule (300 mg total) by mouth 3 (three) times daily. (Patient taking differently: Take 300 mg by mouth as needed (for nerve pain). ) 90 capsule 0  .  Ipratropium-Albuterol (COMBIVENT RESPIMAT) 20-100 MCG/ACT AERS respimat Inhale 2 puffs into the lungs every 6 (six) hours as needed for wheezing (wheezing).    Marland Kitchen lisinopril (PRINIVIL,ZESTRIL) 20 MG tablet Take 20 mg by mouth daily.    Marland Kitchen LORazepam (ATIVAN) 1 MG tablet Take 1 tablet (1 mg total) by mouth every 8 (eight) hours. 24 tablet 0  . montelukast (SINGULAIR) 10 MG tablet Take 1 tablet (10 mg total) by mouth at bedtime. 30 tablet 3  . Multiple Vitamin (MULTIVITAMIN WITH MINERALS) TABS tablet Take 1 tablet by mouth daily.    . potassium chloride SA (K-DUR,KLOR-CON) 20 MEQ tablet Take 2 tablets (40 mEq total) by mouth daily. 60 tablet 11  . spironolactone (ALDACTONE) 25 MG tablet Take 0.5 tablets (12.5 mg total) by mouth daily. Need appointment for future refills. 15 tablet 0  . tiotropium (SPIRIVA) 18 MCG inhalation capsule Place 1 capsule (18 mcg total) into inhaler and inhale daily. 30 capsule 1  . triamcinolone (KENALOG) 0.025 % ointment Apply 1 application topically 2 (two) times daily. (Patient taking differently: Apply 1 application topically 2 (two) times daily as needed. ) 30 g 0  . ipratropium (ATROVENT HFA) 17 MCG/ACT inhaler Inhale 2 puffs into the lungs 3 (three) times daily as needed for wheezing (wheezing).     . [DISCONTINUED] albuterol (PROVENTIL,VENTOLIN) 90 MCG/ACT inhaler Inhale 2 puffs into the lungs every 6 (six) hours as needed for wheezing. 17 g 0   No current facility-administered medications for this visit.     Objective:   Physical Exam BP 118/76 mmHg  Pulse 100  Temp(Src) 98.4 F (36.9 C) (Oral)  Ht 5\' 8"  (1.727 m)  Wt 183 lb 8 oz (83.235 kg)  BMI 27.91 kg/m2 Gen:  Alert, cooperative patient who appears stated age in no acute distress.  Vital signs reviewed. HEENT: EOMI,  MMM Cardiac:  Regular rate and rhythm  Pulm:  Clear to auscultation. Kyphosis noted.  Abd:  Soft/nondistended/nontender. Hernia unchanged, still with skin breakdown and drainage.  No  redness/heat or signs of infection.  Exts: Non edematous BL LE Psych:  Not depressed or anxious appearing.  Linear and coherent thought process as evidenced by speech pattern. Smiles spontaneously.  Neuro:  Ambulating with cane today.  Sensation intact BL LE's currently.    No results found for this or any previous visit (from the past 72 hour(s)).

## 2013-12-15 ENCOUNTER — Telehealth: Payer: Self-pay | Admitting: *Deleted

## 2013-12-15 DIAGNOSIS — G629 Polyneuropathy, unspecified: Secondary | ICD-10-CM | POA: Insufficient documentation

## 2013-12-15 MED ORDER — IPRATROPIUM-ALBUTEROL 20-100 MCG/ACT IN AERS: 2.0000 | INHALATION_SPRAY | RESPIRATORY_TRACT | Status: DC | PRN

## 2013-12-15 NOTE — Telephone Encounter (Signed)
Received a call from [facility] requesting a new Rx for Combivent.  Per pt's caregiver; pt uses it every 4 hours and not as prescribed previous.  Will forward to PCP.  Clovis Pu, RN

## 2013-12-15 NOTE — Assessment & Plan Note (Addendum)
Major issue for he and his family.   Wife now needing FMLA forms to stay home and care for him.   Both he and his wife decline any resources -- though his wife did mention inpatient might be best.  Patient adamantly denies need for this.   Starting ativan taper back to home dose of BID klonopin.  See instructions for details.

## 2013-12-15 NOTE — Assessment & Plan Note (Signed)
Has been on Neurontin for this in past.  Likely secondary to chronic alcohol use. Refill for gabapentin today.

## 2013-12-15 NOTE — Assessment & Plan Note (Signed)
Declines quitting today

## 2013-12-15 NOTE — Telephone Encounter (Signed)
Switched to 30 mg of Cymbalta.  He was only on this for a few days in hospital, no clear indication for increasing to 40 mg.

## 2013-12-15 NOTE — Telephone Encounter (Signed)
Completed.

## 2013-12-15 NOTE — Progress Notes (Signed)
Completed and placed in Tamika's box. 

## 2013-12-15 NOTE — Assessment & Plan Note (Signed)
Starting Cymbalta 30 mg today. Unclear why need for 40 mg.  Continue to follow outpt with Psych.  FU in 2-3 weeks to assess for improvement on cymbalta.

## 2013-12-15 NOTE — Assessment & Plan Note (Signed)
Unchanged.   STill think this is good idea to be removed (CT revealed no bowel involvement, only fat). Encouraged him to FU with surgery.  Discussed high risk of infection of this, especially as he is alcoholic.

## 2013-12-16 NOTE — Progress Notes (Signed)
LMOVM informing [name] that form was ready for pickup. Amour Cutrone, Maryjo Rochester

## 2014-01-04 ENCOUNTER — Ambulatory Visit (INDEPENDENT_AMBULATORY_CARE_PROVIDER_SITE_OTHER): Payer: No Typology Code available for payment source | Admitting: Family Medicine

## 2014-01-04 ENCOUNTER — Encounter: Payer: Self-pay | Admitting: Family Medicine

## 2014-01-04 VITALS — BP 145/83 | HR 108 | Temp 98.2°F | Ht 68.0 in | Wt 192.6 lb

## 2014-01-04 DIAGNOSIS — F329 Major depressive disorder, single episode, unspecified: Secondary | ICD-10-CM

## 2014-01-04 DIAGNOSIS — F17210 Nicotine dependence, cigarettes, uncomplicated: Secondary | ICD-10-CM

## 2014-01-04 DIAGNOSIS — F17200 Nicotine dependence, unspecified, uncomplicated: Secondary | ICD-10-CM

## 2014-01-04 DIAGNOSIS — F32A Depression, unspecified: Secondary | ICD-10-CM

## 2014-01-04 DIAGNOSIS — E871 Hypo-osmolality and hyponatremia: Secondary | ICD-10-CM

## 2014-01-04 DIAGNOSIS — J471 Bronchiectasis with (acute) exacerbation: Secondary | ICD-10-CM

## 2014-01-04 DIAGNOSIS — F1010 Alcohol abuse, uncomplicated: Secondary | ICD-10-CM

## 2014-01-04 MED ORDER — TIOTROPIUM BROMIDE MONOHYDRATE 18 MCG IN CAPS: 18.0000 ug | ORAL_CAPSULE | Freq: Every day | RESPIRATORY_TRACT | Status: DC

## 2014-01-04 NOTE — Patient Instructions (Signed)
Checking sodium today.  You can call here to set up a Pharmacy clinic appointment with Dr. Raymondo Band to stop smoking.  Let me know if you're still using your Combivent every 4-6 hours by early next week.  Continue the Cymbalta at 30 mg.    If you're still needing the Combivent or feeling wheezy despite cutting back on smoking and using the Spiriva, we'll send you to a pulmonologist.

## 2014-01-04 NOTE — Progress Notes (Signed)
Subjective:    Juan [name] is a 63 y.o. male who [facility]:  1.  Depression:  Prescribed Cymbalta at 40 mg while in hospital, switched back to 30 mg last visit.  FU today mainly is to check on his depression.  States he thinks this is helping somewhat.  Has only been on this for about a week or so.  Denies any SI/HI  2.  Recent URI:  Started Wed before Thanksgiving.  Increased sputum production, cough.  No fevers.  Also with rhinorrhea.  Has had to increase Combivent to almost every 4 hours.  Backed off to about every 6 hours but still needing more than usual.  Almost out of his spiriva which has been using as prescribed.    3.  EtOHism:  Started drinking again day he got sick.  Had been dry for about 3 weeks.  Drinking 1 bourbon at lunch.     Prev health:  Patient  Declines colonoscopy and Shingles vaccine today.  ROS as above per HPI, otherwise neg.    The following portions of the patient's history were reviewed and updated as appropriate: allergies, current medications, past medical history, family and social history, and problem list. Patient is a nonsmoker.    PMH reviewed.  Past Medical History  Diagnosis Date  . CHF (congestive heart failure) 10/2010    ECHO:  EF 40%, Grade II diastolic dysfunction  . Alcoholism   . Allergy     seasonal   . Anxiety   . Depression   . Asthma   . Hyperlipidemia   . Hypertension   . Seizures    Past Surgical History  Procedure Laterality Date  . Removal of ingrown toenail      Medications reviewed. Current Outpatient Prescriptions  Medication Sig Dispense Refill  . acetaminophen (TYLENOL) 500 MG tablet Take 1,500 mg by mouth every 6 (six) hours as needed for headache (headache).     Marland Kitchen. aspirin EC 81 MG EC tablet Take 1 tablet (81 mg total) by mouth daily. 90 tablet 0  . atorvastatin (LIPITOR) 40 MG tablet Take 1 tablet (40 mg total) by mouth daily at 6 PM. Need appointment for future refills. 30 tablet 0  . B-Complex  CAPS Take 1 capsule by mouth daily.    Marland Kitchen. bismuth subsalicylate (PEPTO BISMOL) 262 MG/15ML suspension Take 30 mLs by mouth every 6 (six) hours as needed for diarrhea or loose stools.    . carvedilol (COREG) 6.25 MG tablet Take 1 tablet (6.25 mg total) by mouth 2 (two) times daily with a meal. <please schedule appointment for future refills> 60 tablet 2  . Cholecalciferol (VITAMIN D-3) 1000 UNITS CAPS Take 1,000 Units by mouth daily.    . clonazePAM (KLONOPIN) 0.5 MG tablet Take 1 tablet (0.5 mg total) by mouth 2 (two) times daily as needed for anxiety (anxiety). 30 tablet 0  . dicyclomine (BENTYL) 20 MG tablet Take 1 tablet (20 mg total) by mouth 3 (three) times daily before meals. 90 tablet 0  . DULoxetine (CYMBALTA) 30 MG capsule Take 1 capsule (30 mg total) by mouth daily. 30 capsule 3  . Ephedrine-Guaifenesin (PRIMATENE ASTHMA) 12.5-200 MG TABS Take 2 tablets by mouth daily as needed (for shortness of breath).    . EPINEPHrine HCl (ASTHMANEFRIN IN) Inhale 2 puffs into the lungs 3 (three) times daily as needed (Shortness of breath).    . feeding supplement, RESOURCE BREEZE, (RESOURCE BREEZE) LIQD Take 1 Container by mouth 3 (three)  times daily between meals. Chocolate or whatever flavor desired by patient 237 mL 0  . fluticasone (FLONASE) 50 MCG/ACT nasal spray Use two sprays in each nostril daily 16 g 11  . furosemide (LASIX) 40 MG tablet Take 1 tablet (40 mg total) by mouth daily. Need appointment for future refills. 30 tablet 0  . gabapentin (NEURONTIN) 300 MG capsule Take 1 capsule (300 mg total) by mouth 3 (three) times daily. 90 capsule 0  . ipratropium (ATROVENT HFA) 17 MCG/ACT inhaler Inhale 2 puffs into the lungs 3 (three) times daily as needed for wheezing (wheezing).     . Ipratropium-Albuterol (COMBIVENT RESPIMAT) 20-100 MCG/ACT AERS respimat Inhale 2 puffs into the lungs every 4 (four) hours as needed for wheezing (wheezing). 4 g 3  . lisinopril (PRINIVIL,ZESTRIL) 20 MG tablet Take 20  mg by mouth daily.    Marland Kitchen LORazepam (ATIVAN) 1 MG tablet Take 1 tablet (1 mg total) by mouth every 8 (eight) hours. 24 tablet 0  . montelukast (SINGULAIR) 10 MG tablet Take 1 tablet (10 mg total) by mouth at bedtime. 30 tablet 3  . Multiple Vitamin (MULTIVITAMIN WITH MINERALS) TABS tablet Take 1 tablet by mouth daily.    . potassium chloride SA (K-DUR,KLOR-CON) 20 MEQ tablet Take 2 tablets (40 mEq total) by mouth daily. 60 tablet 11  . spironolactone (ALDACTONE) 25 MG tablet Take 0.5 tablets (12.5 mg total) by mouth daily. Need appointment for future refills. 15 tablet 0  . tiotropium (SPIRIVA) 18 MCG inhalation capsule Place 1 capsule (18 mcg total) into inhaler and inhale daily. 30 capsule 1  . triamcinolone (KENALOG) 0.025 % ointment Apply 1 application topically 2 (two) times daily. (Patient taking differently: Apply 1 application topically 2 (two) times daily as needed. ) 30 g 0  . [DISCONTINUED] albuterol (PROVENTIL,VENTOLIN) 90 MCG/ACT inhaler Inhale 2 puffs into the lungs every 6 (six) hours as needed for wheezing. 17 g 0   No current facility-administered medications for this visit.     Objective:   Physical Exam BP 145/83 mmHg  Pulse 108  Temp(Src) 98.2 F (36.8 C) (Oral)  Ht 5\' 8"  (1.727 m)  Wt 192 lb 9.6 oz (87.363 kg)  BMI 29.29 kg/m2 Gen:  Alert, cooperative patient who appears stated age in no acute distress.  Vital signs reviewed. HEENT: EOMI,  MMM Cardiac:  Regular rate and rhythm  Pulm:  Scattered wheezes throughout    Exts: No edema  No results found for this or any previous visit (from the past 72 hour(s)).

## 2014-01-05 ENCOUNTER — Encounter: Payer: Self-pay | Admitting: Family Medicine

## 2014-01-05 LAB — BASIC METABOLIC PANEL
BUN: 8 mg/dL (ref 6–23)
CHLORIDE: 95 meq/L — AB (ref 96–112)
CO2: 24 mEq/L (ref 19–32)
CREATININE: 0.68 mg/dL (ref 0.50–1.35)
Calcium: 8.9 mg/dL (ref 8.4–10.5)
Glucose, Bld: 101 mg/dL — ABNORMAL HIGH (ref 70–99)
Potassium: 4.9 mEq/L (ref 3.5–5.3)
SODIUM: 130 meq/L — AB (ref 135–145)

## 2014-01-05 NOTE — Assessment & Plan Note (Signed)
Worsened by recent URI, which is improving. He is to call if he cannot decrease his Combivent in next several days down form every 4 hours.  (see instructions for this).   Lungs sounded pretty good today, but with some scattered wheezes. Refilled Spiriva.

## 2014-01-05 NOTE — Assessment & Plan Note (Signed)
Now in contemplative stage Discussed meeting with Dr. Raymondo Band.  Sounds like he may be more interested in meeting with him rather than someone else. Recommended FU with [facility] in next 1-2 weeks.

## 2014-01-05 NOTE — Assessment & Plan Note (Signed)
Unfortunately he has started drinking today.   Recommended and counseled for complete cessation of alcohol.   He declined any outpt resources, stating he already has these.

## 2014-01-05 NOTE — Assessment & Plan Note (Signed)
Continue at 30 mg Cymbalta for now.  Needs PHQ-9 next visit to monitor for improvement.

## 2014-01-05 NOTE — Assessment & Plan Note (Signed)
Rechecking sodium today.

## 2014-01-13 ENCOUNTER — Ambulatory Visit: Payer: Self-pay | Admitting: Internal Medicine

## 2014-01-13 ENCOUNTER — Other Ambulatory Visit: Payer: Self-pay | Admitting: Internal Medicine

## 2014-01-13 NOTE — Telephone Encounter (Signed)
Rx refill sent to patient pharmacy  Patient has appointment in January 2016

## 2014-01-21 ENCOUNTER — Ambulatory Visit: Payer: Self-pay | Admitting: Family Medicine

## 2014-01-21 ENCOUNTER — Telehealth: Payer: Self-pay | Admitting: Family Medicine

## 2014-01-21 NOTE — Telephone Encounter (Signed)
Patient did not come to appointment.  Leona Singleton, MD

## 2014-01-21 NOTE — Telephone Encounter (Signed)
If he's not eating he needs to be seen and soon.  Giving him nausea medicine without knowing what exactly is going on may make things worse.

## 2014-01-21 NOTE — Telephone Encounter (Signed)
Spoke with pt's wife ; she stated that pt is drinking a fair amount of bourbon and coke.  Pt has a half gallon and has drank half that.  Denies pt drinking any other liquids.  Pt managed to stop drinking for at least 2 weeks after he was discharged from the hospital back in October.  Pt really does not want to eat.  Pt is asked where does he hurt or how he feels, just stills wife he is hurting all over. She really can not get a lot of details from the patient.  She is wanted to know if PCP can call something in for nausea to help him eat.  Please give her a call.  Clovis Pu, RN

## 2014-01-21 NOTE — Telephone Encounter (Signed)
Informed wife that pt need to bee seen today per Dr. Gwendolyn Grant.  She agreed; appt schedule today with Dr. Karie Schwalbe at 3:30 PM.  She stated she is afraid that he will not come in for the appt.  Advised Mrs. Clemence to either bring pt to clinic or ER for evaluation.  She stated understanding.  Clovis Pu, RN

## 2014-01-21 NOTE — Telephone Encounter (Signed)
Pt wife called. Pt is refusing to come to the appt. Wife would like to talk to dr Gwendolyn Grant about what is going on--feeling very frustrated.

## 2014-01-21 NOTE — Telephone Encounter (Signed)
Pts wife called concerned that pt is still drinking, not eaten in at least 2 days, did get him to half a sandwich last not and felt terrible afterwards, feeling nauseous, cannot throw up, hurts all over. Wants to know if Dr. Gwendolyn Grant can call her or what she should do? Wanted to know if MD can call in something for the nausea, but not sure what is causing it.

## 2014-01-23 ENCOUNTER — Inpatient Hospital Stay (HOSPITAL_COMMUNITY)
Admission: EM | Admit: 2014-01-23 | Discharge: 2014-01-23 | DRG: 894 | Discharge status: Against Medical Advice | Payer: No Typology Code available for payment source | Attending: Family Medicine | Admitting: Family Medicine

## 2014-01-23 ENCOUNTER — Emergency Department (HOSPITAL_COMMUNITY): Payer: No Typology Code available for payment source

## 2014-01-23 ENCOUNTER — Encounter (HOSPITAL_COMMUNITY): Payer: Self-pay | Admitting: *Deleted

## 2014-01-23 DIAGNOSIS — R109 Unspecified abdominal pain: Secondary | ICD-10-CM

## 2014-01-23 DIAGNOSIS — F329 Major depressive disorder, single episode, unspecified: Secondary | ICD-10-CM | POA: Diagnosis present

## 2014-01-23 DIAGNOSIS — Z79899 Other long term (current) drug therapy: Secondary | ICD-10-CM | POA: Diagnosis not present

## 2014-01-23 DIAGNOSIS — E785 Hyperlipidemia, unspecified: Secondary | ICD-10-CM | POA: Diagnosis present

## 2014-01-23 DIAGNOSIS — F419 Anxiety disorder, unspecified: Secondary | ICD-10-CM | POA: Diagnosis present

## 2014-01-23 DIAGNOSIS — I509 Heart failure, unspecified: Secondary | ICD-10-CM | POA: Diagnosis present

## 2014-01-23 DIAGNOSIS — K439 Ventral hernia without obstruction or gangrene: Secondary | ICD-10-CM | POA: Diagnosis present

## 2014-01-23 DIAGNOSIS — R1110 Vomiting, unspecified: Secondary | ICD-10-CM

## 2014-01-23 DIAGNOSIS — R112 Nausea with vomiting, unspecified: Secondary | ICD-10-CM | POA: Diagnosis present

## 2014-01-23 DIAGNOSIS — F10288 Alcohol dependence with other alcohol-induced disorder: Principal | ICD-10-CM | POA: Diagnosis present

## 2014-01-23 DIAGNOSIS — F17210 Nicotine dependence, cigarettes, uncomplicated: Secondary | ICD-10-CM | POA: Diagnosis present

## 2014-01-23 DIAGNOSIS — Z7982 Long term (current) use of aspirin: Secondary | ICD-10-CM | POA: Diagnosis not present

## 2014-01-23 DIAGNOSIS — F1020 Alcohol dependence, uncomplicated: Secondary | ICD-10-CM | POA: Diagnosis present

## 2014-01-23 DIAGNOSIS — Y908 Blood alcohol level of 240 mg/100 ml or more: Secondary | ICD-10-CM | POA: Diagnosis present

## 2014-01-23 DIAGNOSIS — I10 Essential (primary) hypertension: Secondary | ICD-10-CM | POA: Diagnosis present

## 2014-01-23 DIAGNOSIS — J45909 Unspecified asthma, uncomplicated: Secondary | ICD-10-CM | POA: Diagnosis present

## 2014-01-23 LAB — COMPREHENSIVE METABOLIC PANEL
ALK PHOS: 90 U/L (ref 39–117)
ALT: 23 U/L (ref 0–53)
ANION GAP: 18 — AB (ref 5–15)
AST: 43 U/L — ABNORMAL HIGH (ref 0–37)
Albumin: 3.7 g/dL (ref 3.5–5.2)
BUN: 14 mg/dL (ref 6–23)
CALCIUM: 8.8 mg/dL (ref 8.4–10.5)
CO2: 23 mEq/L (ref 19–32)
Chloride: 91 mEq/L — ABNORMAL LOW (ref 96–112)
Creatinine, Ser: 0.91 mg/dL (ref 0.50–1.35)
GFR calc non Af Amer: 90 mL/min — ABNORMAL LOW (ref >90)
Glucose, Bld: 159 mg/dL — ABNORMAL HIGH (ref 70–99)
Potassium: 4.4 mEq/L (ref 3.7–5.3)
Sodium: 132 mEq/L — ABNORMAL LOW (ref 137–147)
TOTAL PROTEIN: 7.5 g/dL (ref 6.0–8.3)
Total Bilirubin: 0.7 mg/dL (ref 0.3–1.2)

## 2014-01-23 LAB — CBC WITH DIFFERENTIAL/PLATELET
BASOS ABS: 0 10*3/uL (ref 0.0–0.1)
BASOS PCT: 1 % (ref 0–1)
Eosinophils Absolute: 0 10*3/uL (ref 0.0–0.7)
Eosinophils Relative: 0 % (ref 0–5)
HCT: 41.8 % (ref 39.0–52.0)
HEMOGLOBIN: 14.6 g/dL (ref 13.0–17.0)
Lymphocytes Relative: 17 % (ref 12–46)
Lymphs Abs: 0.9 10*3/uL (ref 0.7–4.0)
MCH: 32.4 pg (ref 26.0–34.0)
MCHC: 34.9 g/dL (ref 30.0–36.0)
MCV: 92.7 fL (ref 78.0–100.0)
MONOS PCT: 9 % (ref 3–12)
Monocytes Absolute: 0.5 10*3/uL (ref 0.1–1.0)
NEUTROS ABS: 4 10*3/uL (ref 1.7–7.7)
NEUTROS PCT: 73 % (ref 43–77)
Platelets: 235 10*3/uL (ref 150–400)
RBC: 4.51 MIL/uL (ref 4.22–5.81)
RDW: 14.1 % (ref 11.5–15.5)
WBC: 5.5 10*3/uL (ref 4.0–10.5)

## 2014-01-23 LAB — ETHANOL: Alcohol, Ethyl (B): 252 mg/dL — ABNORMAL HIGH (ref 0–11)

## 2014-01-23 LAB — LIPASE, BLOOD: Lipase: 24 U/L (ref 11–59)

## 2014-01-23 MED ORDER — SODIUM CHLORIDE 0.9 % IV BOLUS (SEPSIS)
1000.0000 mL | Freq: Once | INTRAVENOUS | Status: AC
  Administered 2014-01-23: 1000 mL via INTRAVENOUS

## 2014-01-23 MED ORDER — IOHEXOL 300 MG/ML  SOLN: 50.0000 mL | Freq: Once | INTRAMUSCULAR | Status: DC | PRN

## 2014-01-23 MED ORDER — ALBUTEROL SULFATE HFA 108 (90 BASE) MCG/ACT IN AERS: 2.0000 | INHALATION_SPRAY | Freq: Four times a day (QID) | RESPIRATORY_TRACT | Status: DC

## 2014-01-23 MED ORDER — ALBUTEROL SULFATE (2.5 MG/3ML) 0.083% IN NEBU
5.0000 mg | INHALATION_SOLUTION | Freq: Once | RESPIRATORY_TRACT | Status: AC
  Administered 2014-01-23: 5 mg via RESPIRATORY_TRACT
  Filled 2014-01-23: qty 6

## 2014-01-23 MED ORDER — PROMETHAZINE HCL 25 MG/ML IJ SOLN
25.0000 mg | Freq: Once | INTRAMUSCULAR | Status: DC
  Filled 2014-01-23: qty 1

## 2014-01-23 MED ORDER — CLONAZEPAM 0.5 MG PO TABS
1.0000 mg | ORAL_TABLET | Freq: Once | ORAL | Status: AC
  Administered 2014-01-23: 1 mg via ORAL
  Filled 2014-01-23: qty 2

## 2014-01-23 NOTE — ED Notes (Signed)
Noted hernia to abd area.  Pt HR is 119

## 2014-01-23 NOTE — ED Notes (Signed)
MD aware of pt hr in 120,  RT to come and start neb

## 2014-01-23 NOTE — ED Provider Notes (Signed)
5:10 PM I was notified by the nurse that the patient wished to leave the hospital. He would not give me a reason why he wishes to leave AGAINST MEDICAL ADVICE however patient does understand concepts that he could die or get worse without further treatment and evaluation. He is invited to return or go to a different hospital if he wishes further treatment. He will leave AGAINST MEDICAL ADVICE  Doug Sou, MD 01/23/14 [id number]

## 2014-01-23 NOTE — Discharge Instructions (Signed)
Your leaving the hospital AGAINST MEDICAL ADVICE. We have not had adequate chance to treat and evaluate you. There is a chance that you could get worse or die without need treatment. Please return or go to a different hospital if you desire treatment

## 2014-01-23 NOTE — ED Notes (Signed)
Bed: CL27 Expected date: 01/23/14 Expected time: 11:21 AM Means of arrival:  Comments: HTN, not feeling well

## 2014-01-23 NOTE — ED Provider Notes (Signed)
CSN: [id number]     Arrival date & time 01/23/14  1122 History   First MD Initiated Contact with Patient 01/23/14 1133     Chief Complaint  Patient presents with  . Nausea    N/V/D/ anorexia x 4 days      HPI  Patient presents with concern of ongoing nausea, vomiting, anorexia. Symptoms of been present for at least 3 days, though possibly longer. There is no new fever, the patient does describe chills, fatigue. No chest pain, dyspnea. Patient has multiple medical issues, including CHF, alcohol dependency. Last drink was today, just prior to ED arrival. Patient has a recently diagnosed inferior abdominal wall hernia.  Patient states that though this is erythematous, it is unchanged from baseline, is not causing him any discomfort.   Past Medical History  Diagnosis Date  . CHF (congestive heart failure) 10/2010    ECHO:  EF 40%, Grade II diastolic dysfunction  . Alcoholism   . Allergy     seasonal   . Anxiety   . Depression   . Asthma   . Hyperlipidemia   . Hypertension   . Seizures    Past Surgical History  Procedure Laterality Date  . Removal of ingrown toenail     Family History  Problem Relation Age of Onset  . Alzheimer's disease Mother   . Diabetes Mother   . Heart disease Maternal Uncle    History  Substance Use Topics  . Smoking status: Current Every Day Smoker -- 1.50 packs/day for 40 years    Types: Cigarettes  . Smokeless tobacco: Never Used     Comment: currently smokes less than 1 ppd  . Alcohol Use: Yes     Comment: 4-5 burbon's a day for the past 15 years    Review of Systems  Constitutional:       Per HPI, otherwise negative  HENT:       Per HPI, otherwise negative  Respiratory:       Per HPI, otherwise negative  Cardiovascular:       Per HPI, otherwise negative  Gastrointestinal: Positive for nausea and vomiting.  Endocrine:       Negative aside from HPI  Genitourinary:       Neg aside from HPI   Musculoskeletal:       Per HPI,  otherwise negative  Skin: Negative.   Neurological: Negative for syncope.      Allergies  Review of patient's allergies indicates no known allergies.  Home Medications   Prior to Admission medications   Medication Sig Start Date End Date Taking? Authorizing Provider  acetaminophen (TYLENOL) 500 MG tablet Take 1,500 mg by mouth every 6 (six) hours as needed for headache (headache).    Yes Historical Provider, MD  aspirin EC 81 MG EC tablet Take 1 tablet (81 mg total) by mouth daily. 11/13/12   Renae Fickle, MD  atorvastatin (LIPITOR) 40 MG tablet Take 1 tablet by mouth daily at 6 pm. Needs office visit. 01/13/14   Chrystie Nose, MD  B-Complex CAPS Take 1 capsule by mouth daily.    Historical Provider, MD  bismuth subsalicylate (PEPTO BISMOL) 262 MG/15ML suspension Take 30 mLs by mouth every 6 (six) hours as needed for diarrhea or loose stools.    Historical Provider, MD  carvedilol (COREG) 6.25 MG tablet Take 1 tablet (6.25 mg total) by mouth 2 (two) times daily with a meal. <please schedule appointment for future refills> 09/21/13   Chrystie Nose, MD  Cholecalciferol (VITAMIN D-3) 1000 UNITS CAPS Take 1,000 Units by mouth daily.    Historical Provider, MD  clonazePAM (KLONOPIN) 0.5 MG tablet Take 1 tablet (0.5 mg total) by mouth 2 (two) times daily as needed for anxiety (anxiety). 12/01/13   Pincus Large, DO  dicyclomine (BENTYL) 20 MG tablet Take 1 tablet (20 mg total) by mouth 3 (three) times daily before meals. 12/01/13   Pincus Large, DO  DULoxetine (CYMBALTA) 30 MG capsule Take 1 capsule (30 mg total) by mouth daily. 12/14/13   Tobey Grim, MD  Ephedrine-Guaifenesin (PRIMATENE ASTHMA) 12.5-200 MG TABS Take 2 tablets by mouth daily as needed (for shortness of breath).    Historical Provider, MD  EPINEPHrine HCl (ASTHMANEFRIN IN) Inhale 2 puffs into the lungs 3 (three) times daily as needed (Shortness of breath).    Historical Provider, MD  feeding supplement, RESOURCE  BREEZE, (RESOURCE BREEZE) LIQD Take 1 Container by mouth 3 (three) times daily between meals. Chocolate or whatever flavor desired by patient 12/08/13   Tobey Grim, MD  fluticasone [facility]) 50 MCG/ACT nasal spray Use two sprays in each nostril daily 11/08/13   Tobey Grim, MD  furosemide (LASIX) 40 MG tablet Take 1 tablet by mouth daily. Needs office visit. 01/13/14   Chrystie Nose, MD  gabapentin (NEURONTIN) 300 MG capsule Take 1 capsule (300 mg total) by mouth 3 (three) times daily. 12/14/13   Tobey Grim, MD  ipratropium (ATROVENT HFA) 17 MCG/ACT inhaler Inhale 2 puffs into the lungs 3 (three) times daily as needed for wheezing (wheezing).     Historical Provider, MD  Ipratropium-Albuterol (COMBIVENT RESPIMAT) 20-100 MCG/ACT AERS respimat Inhale 2 puffs into the lungs every 4 (four) hours as needed for wheezing (wheezing). 12/15/13   Tobey Grim, MD  lisinopril (PRINIVIL,ZESTRIL) 20 MG tablet Take 20 mg by mouth daily.    Historical Provider, MD  LORazepam (ATIVAN) 1 MG tablet Take 1 tablet (1 mg total) by mouth every 8 (eight) hours. 12/01/13   Pincus Large, DO  montelukast (SINGULAIR) 10 MG tablet Take 1 tablet (10 mg total) by mouth at bedtime. 10/26/13   Tobey Grim, MD  Multiple Vitamin (MULTIVITAMIN WITH MINERALS) TABS tablet Take 1 tablet by mouth daily.    Historical Provider, MD  potassium chloride SA (K-DUR,KLOR-CON) 20 MEQ tablet Take 2 tablets (40 mEq total) by mouth daily. 12/01/12   Chrystie Nose, MD  spironolactone (ALDACTONE) 25 MG tablet TAKE HALF TABLET BY MOUTH DAILY. needs appointment for further refills. 01/13/14   Chrystie Nose, MD  tiotropium (SPIRIVA) 18 MCG inhalation capsule Place 1 capsule (18 mcg total) into inhaler and inhale daily. 01/04/14   Tobey Grim, MD  triamcinolone (KENALOG) 0.025 % ointment Apply 1 application topically 2 (two) times daily. Patient taking differently: Apply 1 application topically 2 (two) times daily as  needed.  10/26/13   Tobey Grim, MD   BP 158/92 mmHg  Pulse 116  Temp(Src) 98.2 F (36.8 C) (Oral)  Resp 18  SpO2 97% Physical Exam  Constitutional: He is oriented to person, place, and time. He appears ill.  Unwell appearing male, awake and alert, in no distress  HENT:  Head: Normocephalic and atraumatic.  Eyes: Conjunctivae and EOM are normal.  Cardiovascular: Regular rhythm.  Tachycardia present.   Pulmonary/Chest: Effort normal. No stridor. No respiratory distress.  Abdominal: He exhibits no distension.    Musculoskeletal: He exhibits no edema.  Neurological: He is alert  and oriented to person, place, and time.  Skin: Skin is warm and dry.  Psychiatric: His mood appears anxious. He expresses no suicidal ideation.  Nursing note and vitals reviewed.   ED Course  Procedures (including critical care time) Labs Review Labs Reviewed  ETHANOL - Abnormal; Notable for the following:    Alcohol, Ethyl (B) 252 (*)    All other components within normal limits  COMPREHENSIVE METABOLIC PANEL - Abnormal; Notable for the following:    Sodium 132 (*)    Chloride 91 (*)    Glucose, Bld 159 (*)    AST 43 (*)    GFR calc non Af Amer 90 (*)    Anion gap 18 (*)    All other components within normal limits  LIPASE, BLOOD  CBC WITH DIFFERENTIAL  I-STAT CG4 LACTIC ACID, ED    Imaging Review Ct Abdomen Pelvis Wo Contrast  01/23/2014   CLINICAL DATA:  Nausea, vomiting and diarrhea. History of abdominal hernia.  EXAM: CT ABDOMEN AND PELVIS WITHOUT CONTRAST  TECHNIQUE: Multidetector CT imaging of the abdomen and pelvis was performed following the standard protocol without IV contrast.  COMPARISON:  11/18/2013  FINDINGS: Lung bases are clear.  Negative for free air.  There are small calcifications in the left kidney with a 4 mm stone in the left kidney lower pole. No definite right kidney stones. Negative for hydronephrosis. Urinary bladder is moderately distended. Mild perinephric edema  which has slightly progressed. Probable exophytic cyst in the left kidney upper pole.  Evidence for a low-density stone in the gallbladder measuring up to 1.6 cm. No gross abnormality to the liver, spleen, pancreas or adrenal glands. There is oral contrast in the stomach and proximal small bowel loops. No evidence to suggest obstruction. Again noted is a large ventral hernia containing fat. No acute inflammatory changes within this large ventral hernia.  Atherosclerotic calcifications in the aorta and visceral arteries without aneurysm. No gross abnormality to the prostate or seminal vesicles. No significant free fluid or lymphadenopathy. Normal appearance of the prostate. Mild the wall thickening of the right colon which is unchanged and no evidence for acute colonic inflammation.  No acute bone abnormality. Old compression fracture involving the L1 vertebral body.  IMPRESSION: Large ventral hernia containing fat.  No complicating features.  No acute abnormalities within the abdomen or pelvis.  Cholelithiasis.  Left nephrolithiasis without hydronephrosis.   Electronically Signed   By: Richarda Overlie M.D.   On: 01/23/2014 15:59    Pulse oximetry 100% room air normal  I reviewed the patient's chart.  2:30 PM Patient's wife is here.  She now states that the patient continues to drink alcohol, after a recent brief dry following recent ED evaluation. Patient has previously refused patient alcohol treatment. She corroborates the patient's history of increasing nausea, fatigue, with diarrhea.  4:11 PM CT results discussed with the patient and his wife. He remained tachycardic, nauseous. Patient confirms that he is interested in pursuing assistance with alcohol addiction. MDM   Final diagnoses:  Abdominal pain  Intractable vomiting with nausea, vomiting of unspecified type   patient presents with ongoing nausea, vomiting, generalized discomfort. Patient has a long history of alcohol abuse, and today's  presentation is likely related to alcohol dependency. Patient's CT scan does not demonstrate acute changes.  Patient remains tachycardic, nauseous in spite of fluid resuscitation, antiemetics, benzodiazepines. Patient required admission for further evaluation and management. I discussed this case with our family practice residency team, the patient's primary  care team, and he was transferred/admitted for further evaluation and management.    Gerhard Munchobert Shirlean Berman, MD 01/23/14 [phone]

## 2014-01-23 NOTE — ED Notes (Signed)
N/V/D and not eating or drinking x last 4 days.

## 2014-01-23 NOTE — ED Notes (Signed)
Pt states that he does not want to stay/be tranferred over to [facility]. Wife tried unsuccessfully to convince him to stay. Nurse and Dr. Marlou Starks described, at length, the risks of leaving. Patient taken out and directed towards lobby. Alert, oriented and stable upon leaving.

## 2014-01-23 NOTE — ED Notes (Signed)
N/V/D.  States last BM was this morning was loose stool.  Pt alert and oriented.  States drinks burbon everyday and last drink was this morning.

## 2014-01-24 ENCOUNTER — Emergency Department (HOSPITAL_COMMUNITY): Payer: No Typology Code available for payment source

## 2014-01-24 ENCOUNTER — Encounter (HOSPITAL_COMMUNITY): Payer: Self-pay | Admitting: Emergency Medicine

## 2014-01-24 ENCOUNTER — Inpatient Hospital Stay (HOSPITAL_COMMUNITY)
Admission: EM | Admit: 2014-01-24 | Discharge: 2014-01-27 | DRG: 897 | Disposition: A | Discharge status: Home / Self Care | Payer: No Typology Code available for payment source | Attending: Family Medicine | Admitting: Family Medicine

## 2014-01-24 DIAGNOSIS — E559 Vitamin D deficiency, unspecified: Secondary | ICD-10-CM | POA: Diagnosis present

## 2014-01-24 DIAGNOSIS — E876 Hypokalemia: Secondary | ICD-10-CM | POA: Diagnosis present

## 2014-01-24 DIAGNOSIS — F10239 Alcohol dependence with withdrawal, unspecified: Secondary | ICD-10-CM | POA: Diagnosis not present

## 2014-01-24 DIAGNOSIS — D696 Thrombocytopenia, unspecified: Secondary | ICD-10-CM | POA: Diagnosis present

## 2014-01-24 DIAGNOSIS — F32A Depression, unspecified: Secondary | ICD-10-CM | POA: Diagnosis present

## 2014-01-24 DIAGNOSIS — R0602 Shortness of breath: Secondary | ICD-10-CM

## 2014-01-24 DIAGNOSIS — L409 Psoriasis, unspecified: Secondary | ICD-10-CM | POA: Diagnosis present

## 2014-01-24 DIAGNOSIS — R112 Nausea with vomiting, unspecified: Secondary | ICD-10-CM | POA: Diagnosis not present

## 2014-01-24 DIAGNOSIS — J449 Chronic obstructive pulmonary disease, unspecified: Secondary | ICD-10-CM | POA: Diagnosis present

## 2014-01-24 DIAGNOSIS — Z7982 Long term (current) use of aspirin: Secondary | ICD-10-CM

## 2014-01-24 DIAGNOSIS — Z7951 Long term (current) use of inhaled steroids: Secondary | ICD-10-CM

## 2014-01-24 DIAGNOSIS — F17210 Nicotine dependence, cigarettes, uncomplicated: Secondary | ICD-10-CM | POA: Diagnosis present

## 2014-01-24 DIAGNOSIS — F418 Other specified anxiety disorders: Secondary | ICD-10-CM | POA: Diagnosis present

## 2014-01-24 DIAGNOSIS — F1010 Alcohol abuse, uncomplicated: Secondary | ICD-10-CM

## 2014-01-24 DIAGNOSIS — Y906 Blood alcohol level of 120-199 mg/100 ml: Secondary | ICD-10-CM | POA: Diagnosis present

## 2014-01-24 DIAGNOSIS — Z79899 Other long term (current) drug therapy: Secondary | ICD-10-CM

## 2014-01-24 DIAGNOSIS — F419 Anxiety disorder, unspecified: Secondary | ICD-10-CM | POA: Diagnosis present

## 2014-01-24 DIAGNOSIS — R197 Diarrhea, unspecified: Secondary | ICD-10-CM | POA: Diagnosis present

## 2014-01-24 DIAGNOSIS — J45909 Unspecified asthma, uncomplicated: Secondary | ICD-10-CM | POA: Diagnosis present

## 2014-01-24 DIAGNOSIS — I5042 Chronic combined systolic (congestive) and diastolic (congestive) heart failure: Secondary | ICD-10-CM | POA: Diagnosis present

## 2014-01-24 DIAGNOSIS — R569 Unspecified convulsions: Secondary | ICD-10-CM | POA: Diagnosis present

## 2014-01-24 DIAGNOSIS — E785 Hyperlipidemia, unspecified: Secondary | ICD-10-CM | POA: Diagnosis present

## 2014-01-24 DIAGNOSIS — E871 Hypo-osmolality and hyponatremia: Secondary | ICD-10-CM | POA: Diagnosis present

## 2014-01-24 DIAGNOSIS — G629 Polyneuropathy, unspecified: Secondary | ICD-10-CM | POA: Diagnosis present

## 2014-01-24 DIAGNOSIS — I10 Essential (primary) hypertension: Secondary | ICD-10-CM | POA: Diagnosis present

## 2014-01-24 DIAGNOSIS — F10939 Alcohol use, unspecified with withdrawal, unspecified: Secondary | ICD-10-CM | POA: Diagnosis present

## 2014-01-24 DIAGNOSIS — F329 Major depressive disorder, single episode, unspecified: Secondary | ICD-10-CM | POA: Diagnosis present

## 2014-01-24 DIAGNOSIS — K429 Umbilical hernia without obstruction or gangrene: Secondary | ICD-10-CM | POA: Diagnosis present

## 2014-01-24 DIAGNOSIS — R911 Solitary pulmonary nodule: Secondary | ICD-10-CM | POA: Diagnosis present

## 2014-01-24 DIAGNOSIS — J441 Chronic obstructive pulmonary disease with (acute) exacerbation: Secondary | ICD-10-CM

## 2014-01-24 DIAGNOSIS — Z8701 Personal history of pneumonia (recurrent): Secondary | ICD-10-CM

## 2014-01-24 LAB — CBC WITH DIFFERENTIAL/PLATELET
Basophils Absolute: 0.1 10*3/uL (ref 0.0–0.1)
Basophils Relative: 1 % (ref 0–1)
Eosinophils Absolute: 0.1 10*3/uL (ref 0.0–0.7)
Eosinophils Relative: 1 % (ref 0–5)
HCT: 39.9 % (ref 39.0–52.0)
HEMOGLOBIN: 14 g/dL (ref 13.0–17.0)
LYMPHS ABS: 1.5 10*3/uL (ref 0.7–4.0)
Lymphocytes Relative: 22 % (ref 12–46)
MCH: 31.6 pg (ref 26.0–34.0)
MCHC: 35.1 g/dL (ref 30.0–36.0)
MCV: 90.1 fL (ref 78.0–100.0)
MONOS PCT: 12 % (ref 3–12)
Monocytes Absolute: 0.8 10*3/uL (ref 0.1–1.0)
NEUTROS ABS: 4.2 10*3/uL (ref 1.7–7.7)
NEUTROS PCT: 64 % (ref 43–77)
Platelets: 215 10*3/uL (ref 150–400)
RBC: 4.43 MIL/uL (ref 4.22–5.81)
RDW: 13.7 % (ref 11.5–15.5)
WBC: 6.6 10*3/uL (ref 4.0–10.5)

## 2014-01-24 LAB — COMPREHENSIVE METABOLIC PANEL
ALK PHOS: 93 U/L (ref 39–117)
ALT: 28 U/L (ref 0–53)
ANION GAP: 21 — AB (ref 5–15)
AST: 69 U/L — ABNORMAL HIGH (ref 0–37)
Albumin: 3.7 g/dL (ref 3.5–5.2)
BILIRUBIN TOTAL: 1 mg/dL (ref 0.3–1.2)
BUN: 12 mg/dL (ref 6–23)
CHLORIDE: 90 meq/L — AB (ref 96–112)
CO2: 20 mEq/L (ref 19–32)
Calcium: 8.5 mg/dL (ref 8.4–10.5)
Creatinine, Ser: 0.72 mg/dL (ref 0.50–1.35)
GFR calc non Af Amer: >90 mL/min (ref >90)
GLUCOSE: 144 mg/dL — AB (ref 70–99)
POTASSIUM: 4.1 meq/L (ref 3.7–5.3)
Sodium: 131 mEq/L — ABNORMAL LOW (ref 137–147)
Total Protein: 7.6 g/dL (ref 6.0–8.3)

## 2014-01-24 LAB — ETHANOL: Alcohol, Ethyl (B): 191 mg/dL — ABNORMAL HIGH (ref 0–11)

## 2014-01-24 LAB — I-STAT TROPONIN, ED: Troponin i, poc: 0.02 ng/mL (ref 0.00–0.08)

## 2014-01-24 MED ORDER — SODIUM CHLORIDE 0.9 % IV BOLUS (SEPSIS)
1000.0000 mL | Freq: Once | INTRAVENOUS | Status: AC
  Administered 2014-01-24: 1000 mL via INTRAVENOUS

## 2014-01-24 MED ORDER — IPRATROPIUM-ALBUTEROL 0.5-2.5 (3) MG/3ML IN SOLN
3.0000 mL | Freq: Once | RESPIRATORY_TRACT | Status: AC
  Administered 2014-01-24: 3 mL via RESPIRATORY_TRACT
  Filled 2014-01-24: qty 3

## 2014-01-24 MED ORDER — LORAZEPAM 2 MG/ML IJ SOLN
1.0000 mg | Freq: Once | INTRAMUSCULAR | Status: AC
  Administered 2014-01-24: 1 mg via INTRAVENOUS
  Filled 2014-01-24: qty 1

## 2014-01-24 MED ORDER — CARVEDILOL 6.25 MG PO TABS
6.2500 mg | ORAL_TABLET | Freq: Once | ORAL | Status: AC
  Administered 2014-01-25: 6.25 mg via ORAL
  Filled 2014-01-24: qty 1

## 2014-01-24 MED ORDER — SPIRONOLACTONE 25 MG PO TABS
25.0000 mg | ORAL_TABLET | Freq: Once | ORAL | Status: AC
  Administered 2014-01-25: 25 mg via ORAL
  Filled 2014-01-24: qty 1

## 2014-01-24 MED ORDER — GABAPENTIN 300 MG PO CAPS
300.0000 mg | ORAL_CAPSULE | Freq: Three times a day (TID) | ORAL | Status: DC
  Administered 2014-01-25 – 2014-01-27 (×8): 300 mg via ORAL
  Filled 2014-01-24 (×11): qty 1

## 2014-01-24 NOTE — ED Notes (Signed)
Pt given ginger ale and water 

## 2014-01-24 NOTE — ED Provider Notes (Signed)
CSN: [id number]     Arrival date & time 01/24/14  1946 History   First MD Initiated Contact with Patient 01/24/14 2102     Chief Complaint  Patient presents with  . Emesis  . Fatigue     (Consider location/radiation/quality/duration/timing/severity/associated sxs/prior Treatment) HPI Comments: Patient reports "not feeling well" was seen yesterday at [facility] had labs IV fluids CT Scan but left AMA  Today report that he vomited  numerous times after leaving the hospital, had 1 loose BM was able to drink Bourbon and take a nap waking about 4 PM has been SOB but did not use his inhaler and just feel worse. States does not want Detox   Patient is a 63 y.o. male presenting with vomiting. The history is provided by the patient.  Emesis Severity:  Moderate Timing:  Constant Quality:  Unable to specify Able to tolerate:  Liquids Progression:  Unchanged Chronicity:  Recurrent Recent urination:  Normal Context: not post-tussive and not self-induced   Relieved by:  None tried Worsened by:  Nothing tried Ineffective treatments:  None tried Associated symptoms: abdominal pain   Associated symptoms: no chills   Risk factors: alcohol use     Past Medical History  Diagnosis Date  . CHF (congestive heart failure) 10/2010    ECHO:  EF 40%, Grade II diastolic dysfunction  . Alcoholism   . Allergy     seasonal   . Anxiety   . Depression   . Asthma   . Hyperlipidemia   . Hypertension   . Seizures    Past Surgical History  Procedure Laterality Date  . Removal of ingrown toenail     Family History  Problem Relation Age of Onset  . Alzheimer's disease Mother   . Diabetes Mother   . Heart disease Maternal Uncle    History  Substance Use Topics  . Smoking status: Current Every Day Smoker -- 1.50 packs/day for 40 years    Types: Cigarettes  . Smokeless tobacco: Never Used     Comment: currently smokes less than 1 ppd  . Alcohol Use: Yes     Comment: 4-5 burbon's a day for the past  15 years    Review of Systems  Constitutional: Negative for fever and chills.  Respiratory: Positive for shortness of breath and wheezing. Negative for cough.   Cardiovascular: Positive for chest pain.  Gastrointestinal: Positive for nausea, vomiting and abdominal pain. Negative for abdominal distention.  Neurological: Negative for dizziness and numbness.  All other systems reviewed and are negative.     Allergies  Review of patient's allergies indicates no known allergies.  Home Medications   Prior to Admission medications   Medication Sig Start Date End Date Taking? Authorizing Provider  acetaminophen (TYLENOL) 500 MG tablet Take 1,500 mg by mouth every 6 (six) hours as needed for headache (headache).    Yes Historical Provider, MD  aspirin EC 81 MG EC tablet Take 1 tablet (81 mg total) by mouth daily. 11/13/12  Yes Renae Fickle, MD  atorvastatin (LIPITOR) 40 MG tablet Take 1 tablet by mouth daily at 6 pm. Needs office visit. 01/13/14  Yes Chrystie Nose, MD  B-Complex CAPS Take 1 capsule by mouth daily.   Yes Historical Provider, MD  carvedilol (COREG) 6.25 MG tablet Take 1 tablet (6.25 mg total) by mouth 2 (two) times daily with a meal. <please schedule appointment for future refills> 09/21/13  Yes Chrystie Nose, MD  Cholecalciferol (VITAMIN D-3) 1000 UNITS CAPS Take  1,000 Units by mouth daily.   Yes Historical Provider, MD  clonazePAM (KLONOPIN) 0.5 MG tablet Take 1 tablet (0.5 mg total) by mouth 2 (two) times daily as needed for anxiety (anxiety). 12/01/13  Yes Pincus Large, DO  dicyclomine (BENTYL) 20 MG tablet Take 1 tablet (20 mg total) by mouth 3 (three) times daily before meals. 12/01/13  Yes Pincus Large, DO  DULoxetine (CYMBALTA) 30 MG capsule Take 1 capsule (30 mg total) by mouth daily. 12/14/13  Yes Tobey Grim, MD  Ephedrine-Guaifenesin (PRIMATENE ASTHMA) 12.5-200 MG TABS Take 2 tablets by mouth daily as needed (for shortness of breath).   Yes Historical  Provider, MD  EPINEPHrine HCl (ASTHMANEFRIN IN) Inhale 2 puffs into the lungs 3 (three) times daily as needed (Shortness of breath).   Yes Historical Provider, MD  feeding supplement, RESOURCE BREEZE, (RESOURCE BREEZE) LIQD Take 1 Container by mouth 3 (three) times daily between meals. Chocolate or whatever flavor desired by patient 12/08/13  Yes Tobey Grim, MD  fluticasone [facility]) 50 MCG/ACT nasal spray Use two sprays in each nostril daily 11/08/13  Yes Tobey Grim, MD  furosemide (LASIX) 40 MG tablet Take 1 tablet by mouth daily. Needs office visit. 01/13/14  Yes Chrystie Nose, MD  gabapentin (NEURONTIN) 300 MG capsule Take 1 capsule (300 mg total) by mouth 3 (three) times daily. 12/14/13  Yes Tobey Grim, MD  Ipratropium-Albuterol (COMBIVENT RESPIMAT) 20-100 MCG/ACT AERS respimat Inhale 2 puffs into the lungs every 4 (four) hours as needed for wheezing (wheezing). 12/15/13  Yes Tobey Grim, MD  lisinopril (PRINIVIL,ZESTRIL) 20 MG tablet Take 20 mg by mouth daily.   Yes Historical Provider, MD  LORazepam (ATIVAN) 1 MG tablet Take 1 tablet (1 mg total) by mouth every 8 (eight) hours. 12/01/13  Yes Pincus Large, DO  montelukast (SINGULAIR) 10 MG tablet Take 1 tablet (10 mg total) by mouth at bedtime. 10/26/13  Yes Tobey Grim, MD  Multiple Vitamin (MULTIVITAMIN WITH MINERALS) TABS tablet Take 1 tablet by mouth daily.   Yes Historical Provider, MD  potassium chloride SA (K-DUR,KLOR-CON) 20 MEQ tablet Take 2 tablets (40 mEq total) by mouth daily. 12/01/12  Yes Chrystie Nose, MD  spironolactone (ALDACTONE) 25 MG tablet TAKE HALF TABLET BY MOUTH DAILY. needs appointment for further refills. 01/13/14  Yes Chrystie Nose, MD  tiotropium (SPIRIVA) 18 MCG inhalation capsule Place 1 capsule (18 mcg total) into inhaler and inhale daily. 01/04/14  Yes Tobey Grim, MD  triamcinolone (KENALOG) 0.025 % ointment Apply 1 application topically 2 (two) times daily. Patient taking  differently: Apply 1 application topically 2 (two) times daily as needed (for rash).  10/26/13  Yes Tobey Grim, MD   BP 158/99 mmHg  Pulse 104  Temp(Src) 97.8 F (36.6 C) (Oral)  Resp 23  SpO2 96% Physical Exam  Constitutional: He is oriented to person, place, and time. He appears well-developed and well-nourished.  HENT:  Head: Normocephalic and atraumatic.  Eyes: Pupils are equal, round, and reactive to light.  Neck: Normal range of motion.  Cardiovascular: Regular rhythm.  Tachycardia present.   Pulmonary/Chest: Effort normal. No respiratory distress. He has wheezes.  Abdominal: Soft. He exhibits no distension. There is no tenderness.  Musculoskeletal: Normal range of motion.  Neurological: He is alert and oriented to person, place, and time.  Skin: Skin is warm.  Nursing note and vitals reviewed.   ED Course  Procedures (including critical care time) Labs  Review Labs Reviewed  COMPREHENSIVE METABOLIC PANEL - Abnormal; Notable for the following:    Sodium 131 (*)    Chloride 90 (*)    Glucose, Bld 144 (*)    AST 69 (*)    Anion gap 21 (*)    All other components within normal limits  ETHANOL - Abnormal; Notable for the following:    Alcohol, Ethyl (B) 191 (*)    All other components within normal limits  CBC WITH DIFFERENTIAL  I-STAT TROPOININ, ED    Imaging Review Ct Abdomen Pelvis Wo Contrast  01/23/2014   CLINICAL DATA:  Nausea, vomiting and diarrhea. History of abdominal hernia.  EXAM: CT ABDOMEN AND PELVIS WITHOUT CONTRAST  TECHNIQUE: Multidetector CT imaging of the abdomen and pelvis was performed following the standard protocol without IV contrast.  COMPARISON:  11/18/2013  FINDINGS: Lung bases are clear.  Negative for free air.  There are small calcifications in the left kidney with a 4 mm stone in the left kidney lower pole. No definite right kidney stones. Negative for hydronephrosis. Urinary bladder is moderately distended. Mild perinephric edema which  has slightly progressed. Probable exophytic cyst in the left kidney upper pole.  Evidence for a low-density stone in the gallbladder measuring up to 1.6 cm. No gross abnormality to the liver, spleen, pancreas or adrenal glands. There is oral contrast in the stomach and proximal small bowel loops. No evidence to suggest obstruction. Again noted is a large ventral hernia containing fat. No acute inflammatory changes within this large ventral hernia.  Atherosclerotic calcifications in the aorta and visceral arteries without aneurysm. No gross abnormality to the prostate or seminal vesicles. No significant free fluid or lymphadenopathy. Normal appearance of the prostate. Mild the wall thickening of the right colon which is unchanged and no evidence for acute colonic inflammation.  No acute bone abnormality. Old compression fracture involving the L1 vertebral body.  IMPRESSION: Large ventral hernia containing fat.  No complicating features.  No acute abnormalities within the abdomen or pelvis.  Cholelithiasis.  Left nephrolithiasis without hydronephrosis.   Electronically Signed   By: Richarda Overlie M.D.   On: 01/23/2014 15:59   Dg Chest 2 View  01/24/2014   CLINICAL DATA:  Initial evaluation for shortness of breath.  EXAM: CHEST  2 VIEW  COMPARISON:  Prior study from 01/23/2014  FINDINGS: The cardiac and mediastinal silhouettes are stable in size and contour, and remain within normal limits.  The lungs are normally inflated. No airspace consolidation, pleural effusion, or pulmonary edema is identified. There is no pneumothorax. Irregular biapical pleural thickening noted.  There is accentuation of the normal thoracic kyphosis. No acute osseus abnormality. Multilevel degenerative spurring present within the thoracic spine.  IMPRESSION: No active cardiopulmonary disease.   Electronically Signed   By: Rise Mu M.D.   On: 01/24/2014 23:31     EKG Interpretation   Date/Time:  Monday January 24 2014  21:21:43 EST Ventricular Rate:  117 PR Interval:  110 QRS Duration: 144 QT Interval:  344 QTC Calculation: 480 R Axis:   -109 Text Interpretation:  Sinus tachycardia Multiple ventricular premature  complexes RBBB and LAFB No significant change was found Confirmed by  [facility]  MD, TREY ([id number]) on 01/24/2014 10:02:35 PM      MDM  Patient is very tremulous, remains tachycardic, diarrhea has returned  Will admit for continued care.  I have stated CIWA protocol- patient states does not have Hx of withdrawal seizures Final diagnoses:  SOB (shortness of  breath)  COPD exacerbation  Diarrhea  ETOH abuse         Arman FilterGail K Jessamy Torosyan, NP 01/25/14 0231  Lyanne CoKevin M Campos, MD 01/25/14 [phone]

## 2014-01-24 NOTE — ED Notes (Signed)
EDP notified of increased HR, 12 lead EKG completed and signed by EDP.

## 2014-01-24 NOTE — ED Notes (Signed)
Pt. reports nausea , vomitting , diarrhea and fatigue for several days , seen at [facility] yesterday for the same complaint ( blood tests and CT abdomen done ) , pt. also reported that he drank Burbon today .

## 2014-01-24 NOTE — ED Notes (Signed)
Gail NP at bedside 

## 2014-01-25 DIAGNOSIS — R197 Diarrhea, unspecified: Secondary | ICD-10-CM

## 2014-01-25 DIAGNOSIS — Y906 Blood alcohol level of 120-199 mg/100 ml: Secondary | ICD-10-CM | POA: Diagnosis present

## 2014-01-25 DIAGNOSIS — Z79899 Other long term (current) drug therapy: Secondary | ICD-10-CM | POA: Diagnosis not present

## 2014-01-25 DIAGNOSIS — R0602 Shortness of breath: Secondary | ICD-10-CM

## 2014-01-25 DIAGNOSIS — F10939 Alcohol use, unspecified with withdrawal, unspecified: Secondary | ICD-10-CM | POA: Diagnosis present

## 2014-01-25 DIAGNOSIS — I10 Essential (primary) hypertension: Secondary | ICD-10-CM | POA: Diagnosis present

## 2014-01-25 DIAGNOSIS — F10239 Alcohol dependence with withdrawal, unspecified: Secondary | ICD-10-CM | POA: Diagnosis present

## 2014-01-25 DIAGNOSIS — I5042 Chronic combined systolic (congestive) and diastolic (congestive) heart failure: Secondary | ICD-10-CM | POA: Diagnosis present

## 2014-01-25 DIAGNOSIS — Z8701 Personal history of pneumonia (recurrent): Secondary | ICD-10-CM | POA: Diagnosis not present

## 2014-01-25 DIAGNOSIS — R112 Nausea with vomiting, unspecified: Secondary | ICD-10-CM | POA: Diagnosis present

## 2014-01-25 DIAGNOSIS — L409 Psoriasis, unspecified: Secondary | ICD-10-CM | POA: Diagnosis present

## 2014-01-25 DIAGNOSIS — E785 Hyperlipidemia, unspecified: Secondary | ICD-10-CM | POA: Diagnosis present

## 2014-01-25 DIAGNOSIS — F17210 Nicotine dependence, cigarettes, uncomplicated: Secondary | ICD-10-CM | POA: Diagnosis present

## 2014-01-25 DIAGNOSIS — G629 Polyneuropathy, unspecified: Secondary | ICD-10-CM | POA: Diagnosis present

## 2014-01-25 DIAGNOSIS — J449 Chronic obstructive pulmonary disease, unspecified: Secondary | ICD-10-CM | POA: Diagnosis present

## 2014-01-25 DIAGNOSIS — R911 Solitary pulmonary nodule: Secondary | ICD-10-CM | POA: Diagnosis present

## 2014-01-25 DIAGNOSIS — E876 Hypokalemia: Secondary | ICD-10-CM | POA: Diagnosis present

## 2014-01-25 DIAGNOSIS — J45909 Unspecified asthma, uncomplicated: Secondary | ICD-10-CM | POA: Diagnosis present

## 2014-01-25 DIAGNOSIS — E871 Hypo-osmolality and hyponatremia: Secondary | ICD-10-CM | POA: Diagnosis present

## 2014-01-25 DIAGNOSIS — Z7982 Long term (current) use of aspirin: Secondary | ICD-10-CM | POA: Diagnosis not present

## 2014-01-25 DIAGNOSIS — F329 Major depressive disorder, single episode, unspecified: Secondary | ICD-10-CM

## 2014-01-25 DIAGNOSIS — F419 Anxiety disorder, unspecified: Secondary | ICD-10-CM

## 2014-01-25 DIAGNOSIS — F1010 Alcohol abuse, uncomplicated: Secondary | ICD-10-CM

## 2014-01-25 DIAGNOSIS — R569 Unspecified convulsions: Secondary | ICD-10-CM | POA: Diagnosis present

## 2014-01-25 DIAGNOSIS — I5032 Chronic diastolic (congestive) heart failure: Secondary | ICD-10-CM

## 2014-01-25 DIAGNOSIS — K429 Umbilical hernia without obstruction or gangrene: Secondary | ICD-10-CM | POA: Diagnosis present

## 2014-01-25 DIAGNOSIS — D696 Thrombocytopenia, unspecified: Secondary | ICD-10-CM | POA: Diagnosis present

## 2014-01-25 DIAGNOSIS — Z7951 Long term (current) use of inhaled steroids: Secondary | ICD-10-CM | POA: Diagnosis not present

## 2014-01-25 DIAGNOSIS — E559 Vitamin D deficiency, unspecified: Secondary | ICD-10-CM | POA: Diagnosis present

## 2014-01-25 DIAGNOSIS — F418 Other specified anxiety disorders: Secondary | ICD-10-CM | POA: Diagnosis present

## 2014-01-25 LAB — BASIC METABOLIC PANEL
Anion gap: 10 (ref 5–15)
BUN: 12 mg/dL (ref 6–23)
CALCIUM: 8 mg/dL — AB (ref 8.4–10.5)
CO2: 23 mmol/L (ref 19–32)
CREATININE: 0.88 mg/dL (ref 0.50–1.35)
Chloride: 97 mEq/L (ref 96–112)
GFR calc Af Amer: >90 mL/min (ref >90)
Glucose, Bld: 120 mg/dL — ABNORMAL HIGH (ref 70–99)
Potassium: 3.8 mmol/L (ref 3.5–5.1)
Sodium: 130 mmol/L — ABNORMAL LOW (ref 135–145)

## 2014-01-25 LAB — MRSA PCR SCREENING: MRSA BY PCR: POSITIVE — AB

## 2014-01-25 LAB — TROPONIN I: TROPONIN I: 0.03 ng/mL (ref <0.031)

## 2014-01-25 MED ORDER — LEVALBUTEROL HCL 1.25 MG/0.5ML IN NEBU
1.2500 mg | INHALATION_SOLUTION | Freq: Three times a day (TID) | RESPIRATORY_TRACT | Status: DC | PRN
  Filled 2014-01-25: qty 0.5

## 2014-01-25 MED ORDER — TIOTROPIUM BROMIDE MONOHYDRATE 18 MCG IN CAPS
18.0000 ug | ORAL_CAPSULE | Freq: Every day | RESPIRATORY_TRACT | Status: DC
  Administered 2014-01-25 – 2014-01-27 (×3): 18 ug via RESPIRATORY_TRACT
  Filled 2014-01-25: qty 5

## 2014-01-25 MED ORDER — ACETAMINOPHEN 650 MG RE SUPP: 650.0000 mg | Freq: Four times a day (QID) | RECTAL | Status: DC | PRN

## 2014-01-25 MED ORDER — LORAZEPAM 2 MG/ML IJ SOLN
0.0000 mg | Freq: Four times a day (QID) | INTRAMUSCULAR | Status: DC
  Administered 2014-01-25: 1 mg via INTRAVENOUS

## 2014-01-25 MED ORDER — HYDRALAZINE HCL 20 MG/ML IJ SOLN
INTRAMUSCULAR | Status: AC
  Filled 2014-01-25: qty 1

## 2014-01-25 MED ORDER — FOLIC ACID 1 MG PO TABS
1.0000 mg | ORAL_TABLET | Freq: Every day | ORAL | Status: DC
  Administered 2014-01-25 – 2014-01-27 (×3): 1 mg via ORAL
  Filled 2014-01-25 (×3): qty 1

## 2014-01-25 MED ORDER — FLUTICASONE PROPIONATE 50 MCG/ACT NA SUSP
2.0000 | Freq: Every day | NASAL | Status: DC
  Administered 2014-01-25 – 2014-01-27 (×3): 2 via NASAL
  Filled 2014-01-25: qty 16

## 2014-01-25 MED ORDER — VITAMIN B-1 100 MG PO TABS
100.0000 mg | ORAL_TABLET | Freq: Every day | ORAL | Status: DC
  Filled 2014-01-25: qty 1

## 2014-01-25 MED ORDER — LORAZEPAM 1 MG PO TABS
1.0000 mg | ORAL_TABLET | Freq: Three times a day (TID) | ORAL | Status: DC
  Administered 2014-01-25 – 2014-01-27 (×7): 1 mg via ORAL
  Filled 2014-01-25 (×6): qty 1

## 2014-01-25 MED ORDER — THIAMINE HCL 100 MG/ML IJ SOLN
100.0000 mg | Freq: Every day | INTRAMUSCULAR | Status: DC
  Filled 2014-01-25 (×3): qty 1

## 2014-01-25 MED ORDER — LOPERAMIDE HCL 2 MG PO CAPS
2.0000 mg | ORAL_CAPSULE | ORAL | Status: DC | PRN
  Administered 2014-01-25: 2 mg via ORAL
  Filled 2014-01-25 (×2): qty 1

## 2014-01-25 MED ORDER — MONTELUKAST SODIUM 10 MG PO TABS
10.0000 mg | ORAL_TABLET | Freq: Every day | ORAL | Status: DC
  Administered 2014-01-25 – 2014-01-26 (×2): 10 mg via ORAL
  Filled 2014-01-25 (×3): qty 1

## 2014-01-25 MED ORDER — MUPIROCIN 2 % EX OINT
1.0000 "application " | TOPICAL_OINTMENT | Freq: Two times a day (BID) | CUTANEOUS | Status: DC
  Administered 2014-01-25 – 2014-01-27 (×4): 1 via NASAL
  Filled 2014-01-25: qty 22

## 2014-01-25 MED ORDER — HYDRALAZINE HCL 20 MG/ML IJ SOLN
5.0000 mg | Freq: Once | INTRAMUSCULAR | Status: AC
  Administered 2014-01-25: 5 mg via INTRAVENOUS

## 2014-01-25 MED ORDER — CARVEDILOL 6.25 MG PO TABS
6.2500 mg | ORAL_TABLET | Freq: Every day | ORAL | Status: DC
  Administered 2014-01-25 – 2014-01-27 (×3): 6.25 mg via ORAL
  Filled 2014-01-25 (×3): qty 1

## 2014-01-25 MED ORDER — LORAZEPAM 2 MG/ML IJ SOLN
1.0000 mg | Freq: Four times a day (QID) | INTRAMUSCULAR | Status: DC | PRN
  Administered 2014-01-25: 1 mg via INTRAVENOUS
  Filled 2014-01-25: qty 1

## 2014-01-25 MED ORDER — LORAZEPAM 2 MG/ML IJ SOLN
0.0000 mg | Freq: Two times a day (BID) | INTRAMUSCULAR | Status: DC
Start: 2014-01-25 — End: 2014-01-25

## 2014-01-25 MED ORDER — CHLORHEXIDINE GLUCONATE CLOTH 2 % EX PADS
6.0000 | MEDICATED_PAD | Freq: Every day | CUTANEOUS | Status: DC
  Administered 2014-01-25 – 2014-01-27 (×3): 6 via TOPICAL

## 2014-01-25 MED ORDER — FUROSEMIDE 40 MG PO TABS
40.0000 mg | ORAL_TABLET | Freq: Every day | ORAL | Status: DC
  Administered 2014-01-25 – 2014-01-27 (×3): 40 mg via ORAL
  Filled 2014-01-25 (×3): qty 1

## 2014-01-25 MED ORDER — LISINOPRIL 20 MG PO TABS
20.0000 mg | ORAL_TABLET | Freq: Every day | ORAL | Status: DC
  Administered 2014-01-25 – 2014-01-27 (×3): 20 mg via ORAL
  Filled 2014-01-25 (×3): qty 1

## 2014-01-25 MED ORDER — IPRATROPIUM-ALBUTEROL 0.5-2.5 (3) MG/3ML IN SOLN
3.0000 mL | RESPIRATORY_TRACT | Status: DC | PRN
  Administered 2014-01-25: 3 mL via RESPIRATORY_TRACT
  Filled 2014-01-25: qty 3

## 2014-01-25 MED ORDER — IPRATROPIUM-ALBUTEROL 0.5-2.5 (3) MG/3ML IN SOLN
3.0000 mL | Freq: Once | RESPIRATORY_TRACT | Status: AC
  Administered 2014-01-25: 3 mL via RESPIRATORY_TRACT
  Filled 2014-01-25: qty 3

## 2014-01-25 MED ORDER — THIAMINE HCL 100 MG/ML IJ SOLN
100.0000 mg | Freq: Every day | INTRAMUSCULAR | Status: DC
  Filled 2014-01-25: qty 1

## 2014-01-25 MED ORDER — LORAZEPAM 1 MG PO TABS: 0.0000 mg | ORAL_TABLET | Freq: Four times a day (QID) | ORAL | Status: DC

## 2014-01-25 MED ORDER — LORAZEPAM 1 MG PO TABS: 0.0000 mg | ORAL_TABLET | Freq: Two times a day (BID) | ORAL | Status: DC

## 2014-01-25 MED ORDER — ASPIRIN EC 81 MG PO TBEC
81.0000 mg | DELAYED_RELEASE_TABLET | Freq: Every day | ORAL | Status: DC
  Administered 2014-01-25 – 2014-01-27 (×3): 81 mg via ORAL
  Filled 2014-01-25 (×3): qty 1

## 2014-01-25 MED ORDER — DULOXETINE HCL 30 MG PO CPEP
30.0000 mg | ORAL_CAPSULE | Freq: Every day | ORAL | Status: DC
  Administered 2014-01-25 – 2014-01-27 (×3): 30 mg via ORAL
  Filled 2014-01-25 (×3): qty 1

## 2014-01-25 MED ORDER — BOOST / RESOURCE BREEZE PO LIQD
240.0000 mL | Freq: Three times a day (TID) | ORAL | Status: DC
  Administered 2014-01-25: 1 via ORAL
  Administered 2014-01-25: 240 mL via ORAL
  Administered 2014-01-26 – 2014-01-27 (×2): 1 via ORAL

## 2014-01-25 MED ORDER — TRIAMCINOLONE ACETONIDE 0.025 % EX CREA
1.0000 "application " | TOPICAL_CREAM | Freq: Two times a day (BID) | CUTANEOUS | Status: DC
  Administered 2014-01-25 – 2014-01-27 (×5): 1 via TOPICAL
  Filled 2014-01-25: qty 15

## 2014-01-25 MED ORDER — SODIUM CHLORIDE 0.9 % IV SOLN: INTRAVENOUS | Status: AC

## 2014-01-25 MED ORDER — ADULT MULTIVITAMIN W/MINERALS CH
1.0000 | ORAL_TABLET | Freq: Every day | ORAL | Status: DC
  Administered 2014-01-26 – 2014-01-27 (×2): 1 via ORAL
  Filled 2014-01-25 (×3): qty 1

## 2014-01-25 MED ORDER — ATORVASTATIN CALCIUM 40 MG PO TABS
40.0000 mg | ORAL_TABLET | Freq: Every day | ORAL | Status: DC
  Administered 2014-01-25 – 2014-01-26 (×2): 40 mg via ORAL
  Filled 2014-01-25 (×3): qty 1

## 2014-01-25 MED ORDER — SPIRONOLACTONE 25 MG PO TABS
25.0000 mg | ORAL_TABLET | Freq: Every day | ORAL | Status: DC
  Administered 2014-01-26 – 2014-01-27 (×2): 25 mg via ORAL
  Filled 2014-01-25 (×2): qty 1

## 2014-01-25 MED ORDER — VITAMIN B-1 100 MG PO TABS
100.0000 mg | ORAL_TABLET | Freq: Every day | ORAL | Status: DC
  Administered 2014-01-25 – 2014-01-27 (×3): 100 mg via ORAL
  Filled 2014-01-25 (×3): qty 1

## 2014-01-25 MED ORDER — ACETAMINOPHEN 325 MG PO TABS: 650.0000 mg | ORAL_TABLET | Freq: Four times a day (QID) | ORAL | Status: DC | PRN

## 2014-01-25 MED ORDER — LORAZEPAM 1 MG PO TABS: 1.0000 mg | ORAL_TABLET | Freq: Once | ORAL | Status: AC

## 2014-01-25 MED ORDER — HEPARIN SODIUM (PORCINE) 5000 UNIT/ML IJ SOLN
5000.0000 [IU] | Freq: Three times a day (TID) | INTRAMUSCULAR | Status: DC
  Administered 2014-01-25 – 2014-01-27 (×7): 5000 [IU] via SUBCUTANEOUS
  Filled 2014-01-25 (×9): qty 1

## 2014-01-25 MED ORDER — SODIUM CHLORIDE 0.9 % IV SOLN
INTRAVENOUS | Status: DC
  Administered 2014-01-25: 06:00:00 via INTRAVENOUS

## 2014-01-25 MED ORDER — CLONAZEPAM 0.5 MG PO TABS
0.5000 mg | ORAL_TABLET | Freq: Two times a day (BID) | ORAL | Status: DC | PRN
  Filled 2014-01-25: qty 1

## 2014-01-25 MED ORDER — LORAZEPAM 2 MG/ML IJ SOLN
INTRAMUSCULAR | Status: AC
Start: 2014-01-25 — End: 2014-01-25
  Filled 2014-01-25: qty 1

## 2014-01-25 MED ORDER — LORAZEPAM 1 MG PO TABS
1.0000 mg | ORAL_TABLET | Freq: Four times a day (QID) | ORAL | Status: DC | PRN
  Administered 2014-01-25: 1 mg via ORAL
  Filled 2014-01-25 (×2): qty 1

## 2014-01-25 NOTE — Progress Notes (Signed)
UR completed.  Macayla Ekdahl, RN BSN M[facility] CCM Trauma/Neuro ICU Case Manager [phone]  

## 2014-01-25 NOTE — H&P (Signed)
Family Medicine [facility] Ad[facility] History and Physical Service Pager: [phone]  Patient name: Juan [name] Medical record number: [id number] Date of birth: Feb 07, 1950 Age: 63 y.o. Gender: male  Primary Care Provider: Renold DonWALDEN,JEFF, MD Consultants: none Code Status: FULL (discussed on admission)  Chief Complaint: n/v/d,SOB  Assessment and Plan: Juan [name] is a 63 y.o. male presenting with nausea, vomiting, diarrhea, shortness of breath . PMH is significant for ETOH abuse, diastolic CHF, hepatomegaly, HTN, depression, anxiety, RBB, peripheral neuropathy, seizure d/t ETOH withdrawal  ETOH abuse/ withdrawal.  Mildly tremulous on exam.  AAOx3. No clonus.  Na 131.  AST 69.  ETOH 191.  Patient has a h/o of seizure d/t ETOH withdrawal   -Admit to step down under Dr Lum BabeEniola -CIWA protocol -Thiamine -IVFs  -NPO -Zofran PRN nausea -monitor for seizures  Hyponatremia: Na 131. Likely related to beer potomania  -repeat BMET in am  COPD:  Doesn't appear to be having an exacerbation. Expiratory wheeze on exam but CXR clear. Bronchiectasis was seen on CT 06/14/13. Hx of aspiration pneumonia and with recent episodes of emesis.  Would need to cover for aspiration if initiating ABX.    -Continue home Combivent, Spiriva  - consider outpatient pulm f/u   Diastolic CHF: echo 09/2013 EF 13-08%60-65%. Does not appear fluid overloaded on exam. Reports some orthopnea and PND but hasn't changed the number of pillows that he sleeps with.  -Continue home lasix -strict I's and O's  HTN: BP 161/95.  Has not taken meds today. -Continue home Lisinopril, Lasix, Coreg  Peripheral Neuropathy:   -Continue home Gabapentin  Anxiety/depression:  -Continue Cymbalta, Klonopin, Ativan  Psoriasis: excoriations on b/l ankles exam -Continue home triamcinolone cr  Lung Nodule seen on imaging: noticed on CT scan 06/14/13 and hasn't had a follow up CT - consider CT chest w/out contrast   Tobacco  abuse: 60 pack year hx. Not motivated to quit.  - smoking cessation   Umbilical hernia: not incarcerated. Previously was encouraged to be cleared by Cardiology in order to have surgery.  - outpatient f/u with cardiology and surgery   FEN/GI: NPO due to recent episodes of emesis  Prophylaxis: sub-q heparin  Disposition: Admit to step down unit for CIWA management.  History of Present Illness: Juan [name] is a 63 y.o. male presenting with nausea, vomiting, diarrhea (ETOH withdrawal).  Per RN [facility] yesterday evening.  Patient reports that he hasn't eaten much for the past 5-6 days.  He also endorses diarrhea off and on for the past several weeks. He reports his last drink was yesterday at 11 am.  He states that he normally drinks 4-6 oz of bourbon total for the day. Smokes tobacco. Patient endorses some dyspnea but he reports that it is better since breathing treatments in ED. He admits to coughing up mucous and phlegm but this is chronic for him.  Denies any fever or chills. Denies hematemesis.  Admits to sore throat.   Review Of Systems: Per HPI with the following additions: none Otherwise 12 point review of systems was performed and was unremarkable.  Patient Active Problem List   Diagnosis Date Noted  . Intractable nausea and vomiting 01/23/2014  . Peripheral neuropathy 12/15/2013  . Diarrhea 11/28/2013  . ETOH abuse 11/18/2013  . Rash and nonspecific skin eruption 10/28/2013  . Bronchiectasis without acute exacerbation 06/30/2013  . Lung nodule seen on imaging study 06/15/2013  . Chronic systolic heart failure 06/13/2013  .  Transaminitis 06/13/2013  . RBBB 11/09/2012  . Hyponatremia 11/08/2012  . Seizure due to alcohol withdrawal 11/08/2012  . Umbilical hernia 08/06/2012  . Smoker unmotivated to quit 07/22/2012  . Tremor 07/30/2011  . HTN (hypertension) 07/30/2011  . Hepatomegaly 11/05/2010  . Depression 11/05/2010  .  Anxiety 11/05/2010  . Allergic rhinitis 11/05/2010   Past Medical History: Past Medical History  Diagnosis Date  . CHF (congestive heart failure) 10/2010    ECHO:  EF 40%, Grade II diastolic dysfunction  . Alcoholism   . Allergy     seasonal   . Anxiety   . Depression   . Asthma   . Hyperlipidemia   . Hypertension   . Seizures    Past Surgical History: Past Surgical History  Procedure Laterality Date  . Removal of ingrown toenail     Social History: History  Substance Use Topics  . Smoking status: Current Every Day Smoker -- 1.50 packs/day for 40 years    Types: Cigarettes  . Smokeless tobacco: Never Used     Comment: currently smokes less than 1 ppd  . Alcohol Use: Yes     Comment: 4-5 burbon's a day for the past 15 years   Additional social history: Drinks EtOH, Smokes tobacco, no illicit drugs,  Please also refer to relevant sections of EMR.  Family History: Family History  Problem Relation Age of Onset  . Alzheimer's disease Mother   . Diabetes Mother   . Heart disease Maternal Uncle    Allergies and Medications: No Known Allergies No current facility-administered medications on file prior to encounter.   Current Outpatient Prescriptions on File Prior to Encounter  Medication Sig Dispense Refill  . acetaminophen (TYLENOL) 500 MG tablet Take 1,500 mg by mouth every 6 (six) hours as needed for headache (headache).     Marland Kitchen aspirin EC 81 MG EC tablet Take 1 tablet (81 mg total) by mouth daily. 90 tablet 0  . atorvastatin (LIPITOR) 40 MG tablet Take 1 tablet by mouth daily at 6 pm. Needs office visit. 30 tablet 0  . B-Complex CAPS Take 1 capsule by mouth daily.    . carvedilol (COREG) 6.25 MG tablet Take 1 tablet (6.25 mg total) by mouth 2 (two) times daily with a meal. <please schedule appointment for future refills> 60 tablet 2  . Cholecalciferol (VITAMIN D-3) 1000 UNITS CAPS Take 1,000 Units by mouth daily.    . clonazePAM (KLONOPIN) 0.5 MG tablet Take 1 tablet  (0.5 mg total) by mouth 2 (two) times daily as needed for anxiety (anxiety). 30 tablet 0  . dicyclomine (BENTYL) 20 MG tablet Take 1 tablet (20 mg total) by mouth 3 (three) times daily before meals. 90 tablet 0  . DULoxetine (CYMBALTA) 30 MG capsule Take 1 capsule (30 mg total) by mouth daily. 30 capsule 3  . Ephedrine-Guaifenesin (PRIMATENE ASTHMA) 12.5-200 MG TABS Take 2 tablets by mouth daily as needed (for shortness of breath).    . EPINEPHrine HCl (ASTHMANEFRIN IN) Inhale 2 puffs into the lungs 3 (three) times daily as needed (Shortness of breath).    . feeding supplement, RESOURCE BREEZE, (RESOURCE BREEZE) LIQD Take 1 Container by mouth 3 (three) times daily between meals. Chocolate or whatever flavor desired by patient 237 mL 0  . fluticasone (FLONASE) 50 MCG/ACT nasal spray Use two sprays in each nostril daily 16 g 11  . furosemide (LASIX) 40 MG tablet Take 1 tablet by mouth daily. Needs office visit. 30 tablet 0  . gabapentin (  NEURONTIN) 300 MG capsule Take 1 capsule (300 mg total) by mouth 3 (three) times daily. 90 capsule 0  . Ipratropium-Albuterol (COMBIVENT RESPIMAT) 20-100 MCG/ACT AERS respimat Inhale 2 puffs into the lungs every 4 (four) hours as needed for wheezing (wheezing). 4 g 3  . lisinopril (PRINIVIL,ZESTRIL) 20 MG tablet Take 20 mg by mouth daily.    Marland Kitchen LORazepam (ATIVAN) 1 MG tablet Take 1 tablet (1 mg total) by mouth every 8 (eight) hours. 24 tablet 0  . montelukast (SINGULAIR) 10 MG tablet Take 1 tablet (10 mg total) by mouth at bedtime. 30 tablet 3  . Multiple Vitamin (MULTIVITAMIN WITH MINERALS) TABS tablet Take 1 tablet by mouth daily.    . potassium chloride SA (K-DUR,KLOR-CON) 20 MEQ tablet Take 2 tablets (40 mEq total) by mouth daily. 60 tablet 11  . spironolactone (ALDACTONE) 25 MG tablet TAKE HALF TABLET BY MOUTH DAILY. needs appointment for further refills. 15 tablet 0  . tiotropium (SPIRIVA) 18 MCG inhalation capsule Place 1 capsule (18 mcg total) into inhaler and  inhale daily. 30 capsule 6  . triamcinolone (KENALOG) 0.025 % ointment Apply 1 application topically 2 (two) times daily. (Patient taking differently: Apply 1 application topically 2 (two) times daily as needed (for rash). ) 30 g 0  . [DISCONTINUED] albuterol (PROVENTIL,VENTOLIN) 90 MCG/ACT inhaler Inhale 2 puffs into the lungs every 6 (six) hours as needed for wheezing. 17 g 0    Objective: BP 158/99 mmHg  Pulse 104  Temp(Src) 97.8 F (36.6 C) (Oral)  Resp 23  SpO2 96% Exam: General: awake, alert, sitting up in bed, disheveled man, NAD HEENT: [place]/AT, EOMI, PERRLA, o/p with tobacco staining of teeth and tongue, poor dentition Cardiovascular: tachycardic, RR, no m/r/g Respiratory: global expiratory wheeze with poor air movement, no increased WOB Abdomen: protuberant, ND/NT, liver down 2 finger breadths, softball sized umbilical hernia that was reducible, +excoriation of hernia, no bleeding, noninfected appearing, +BS Extremities: WWP, extensive dry, flaky skin with excoriation on b/l medial heels and ankles, non bleeding, +keratotic toenails Skin: as above Neuro: AAOx3, follows commands, mild intention tremor, no clonus, sensation and strength grossly in tact  Labs and Imaging: CBC BMET   Recent Labs Lab 01/24/14 1947  WBC 6.6  HGB 14.0  HCT 39.9  PLT 215    Recent Labs Lab 01/24/14 1947  NA 131*  K 4.1  CL 90*  CO2 20  BUN 12  CREATININE 0.72  GLUCOSE 144*  CALCIUM 8.5     Ct Abdomen Pelvis Wo Contrast  01/23/2014   CLINICAL DATA:  Nausea, vomiting and diarrhea. History of abdominal hernia.  EXAM: CT ABDOMEN AND PELVIS WITHOUT CONTRAST  TECHNIQUE: Multidetector CT imaging of the abdomen and pelvis was performed following the standard protocol without IV contrast.  COMPARISON:  11/18/2013  FINDINGS: Lung bases are clear.  Negative for free air.  There are small calcifications in the left kidney with a 4 mm stone in the left kidney lower pole. No definite right kidney  stones. Negative for hydronephrosis. Urinary bladder is moderately distended. Mild perinephric edema which has slightly progressed. Probable exophytic cyst in the left kidney upper pole.  Evidence for a low-density stone in the gallbladder measuring up to 1.6 cm. No gross abnormality to the liver, spleen, pancreas or adrenal glands. There is oral contrast in the stomach and proximal small bowel loops. No evidence to suggest obstruction. Again noted is a large ventral hernia containing fat. No acute inflammatory changes within this large ventral hernia.  Atherosclerotic calcifications in the aorta and visceral arteries without aneurysm. No gross abnormality to the prostate or seminal vesicles. No significant free fluid or lymphadenopathy. Normal appearance of the prostate. Mild the wall thickening of the right colon which is unchanged and no evidence for acute colonic inflammation.  No acute bone abnormality. Old compression fracture involving the L1 vertebral body.  IMPRESSION: Large ventral hernia containing fat.  No complicating features.  No acute abnormalities within the abdomen or pelvis.  Cholelithiasis.  Left nephrolithiasis without hydronephrosis.   Electronically Signed   By: Richarda Overlie M.D.   On: 01/23/2014 15:59   Dg Chest 2 View  01/24/2014   CLINICAL DATA:  Initial evaluation for shortness of breath.  EXAM: CHEST  2 VIEW  COMPARISON:  Prior study from 01/23/2014  FINDINGS: The cardiac and mediastinal silhouettes are stable in size and contour, and remain within normal limits.  The lungs are normally inflated. No airspace consolidation, pleural effusion, or pulmonary edema is identified. There is no pneumothorax. Irregular biapical pleural thickening noted.  There is accentuation of the normal thoracic kyphosis. No acute osseus abnormality. Multilevel degenerative spurring present within the thoracic spine.  IMPRESSION: No active cardiopulmonary disease.   Electronically Signed   By: Rise Mu M.D.   On: 01/24/2014 23:31    Raliegh Ip, DO 01/25/2014, 2:32 AM PGY-1, [place] [facility] FPTS Intern pager: [phone], text pages welcome  Upper Level Addendum:  I have seen and evaluated this patient along with Dr. Nadine Counts and reviewed the above note, making necessary revisions in [facility].   Clare Gandy, MD Family Medicine PGY-2

## 2014-01-25 NOTE — Progress Notes (Signed)
**  Interval note**  Patient's CIWA score 3.  Patient reports that N/V/D have subsided since we spoke last.  Breathing is not as good as earlier.  He reports that he feels SOB currently.  RN reports to me that he just received a duoneb treatment.  O: BP 164/96 mmHg  Pulse 110  Temp(Src) 99.2 F (37.3 C) (Oral)  Resp 24  Ht 5\' 8"  (1.727 m)  Wt 182 lb 15.7 oz (83 kg)  BMI 27.83 kg/m2  SpO2 98% Gen: awake, alert, anxious looking, NAD, RN at bedside HEENT: [place]/AT, EOMI, [place] in place Cardio: tachycardic, RR, no m/r/g Pulm: decreased BS throughout, audible expiratory wheeze  A/P: Juan [name] is a 63 y.o. male presenting with nausea, vomiting, diarrhea, shortness of breath . PMH is significant for ETOH abuse, diastolic CHF, hepatomegaly, HTN, depression, anxiety, RBB, peripheral neuropathy, seizure d/t ETOH withdrawal  -Continue CIWA protocol -Monitor for seizures -Repeat EKG and troponins this am -Continue to hydrate with IVFs -Will consider adding diet back this afternoon, clears -Continue monitoring Na -Consider changing duonebs to xopenex in light of tachycardic rhythm. -BP running high in room.  Instructed RN to give Hydralazine 5mg  IV.  Also, RN to give BP meds at 8am instead of 10am.  Ashly M. Nadine Counts, DO PGY-1, [facility] Family Medicine

## 2014-01-25 NOTE — Discharge Summary (Signed)
Family Medicine Teaching [facility] Discharge Summary  Patient name: Juan [name] Medical record number: [id number] Date of birth: 1950/07/16 Age: 63 y.o. Gender: male Date of Admission: 01/24/2014  Date of Discharge: 01/27/14 Admitting Physician: Janit PaganKehinde Eniola, MD  Primary Care Provider: Renold DonWALDEN,JEFF, MD Consultants: none  Indication for Hospitalization: ETOH withdrawal, SOB  Discharge Diagnoses/Problem List:  Alcohol withdrawal ETOH abuse Diarrhea SOB Depression Anxiety HTN Hyponatremia Peripheral neuropathy  Disposition: Discharge home  Discharge Condition: Stable  Discharge Exam:  BP 127/73 mmHg  Pulse 92  Temp(Src) 98.4 F (36.9 C) (Oral)  Resp 18  Ht 5\' 8"  (1.727 m)  Wt 182 lb 6.9 oz (82.75 kg)  BMI 27.74 kg/m2  SpO2 99%   General: disheveled appearing, sleeping in bed, NAD, nurse's aid at bedside Cardiovascular: RRR, no m/r/g, brisk cap refill Respiratory: global expiratory wheeze, no increased WOB, [place] in place Abdomen: protuberant, large umbilical hernia, +BS, NT/ND, soft Extremities: WWP, extensive dry, flaky skin with excoriation on b/l medial heels and ankles, non bleeding, +keratotic toenails, healing lesions on lateral aspect of R calf Neuro: AAOx3, follows commands, no clonus, sensation and strength grossly in tact, normal speech, very mild hand tremor   Brief Hospital Course:  Juan [name] is a 63 Y/O m with PMH of CHF, COPD, Depression, Anxiety,HTN, RBBB, ETOH abuse and tobacco use, who presented with a few days of N/V and diarrhea.  In the ED, CMET was significant for hyponatremia to 131 and an AST elevation to 69.  Alcohol level was 191.  CXR was without infiltrate.  Patient was lucid.  He was tachycardic on exam and for this reason he was treated with Xopenex instead of albuterol for the duration of his hospitalization.  On exam, patient was disheveled and mildly tremulous.  He was admitted to the FPTS for further management of alcohol  withdrawal.    Patient was treated according to the CIWA protocol.  He was also treated with Thiamine, Folate, MVI and IVFs.  He was monitored in a step down unit.  As patient had a h/o seizure activity with prior withdrawals from ETOH, he was also placed on seizure precautions.  Patient had no seizure activity during this hospitalization.  Patient's Na level was monitored and attributed to beer potomania.  In addition, he was found to be hypocalcemic.  PTH was drawn and was normal.  Albumin also normal.  He was treated with calcium carbonate, which he responded well to.  Patient was also found to be Vit D deficient to 15.  He was started on a 6 week course of PO Ergocalciferol 50K units to take once weekly.  Patient was eventually able to titrate down to his home dose of Ativan, with only very mild tremor in his hands on discharge.  He was discharge home in stable condition with close follow up with his PCP.     In addition, patient had a CT chest performed in May of this year that revealed a pulmonary nodule in RUL.  Repeat CT was recommended 3 months afterwards, but this was not obtained by patient.  We repeated his CT chest, which revealed a stable RUL nodule.  Recommendations by radiology is to repeat this again in 9 months.  His home medications were continued for his HTN, peripheral neuropathy, anxiety/depression, and COPD       Issues for Follow Up:   Continued counseling on ETOH abuse/dependence  Recommend repeat CMET for Ca follow up.  Albumin normal in hospital.  PTH normal as  well. Consider continuing calcium carbonate.  Follow up hgb, platelets.  Hgb 10.9 on discharge.  Patient asymptomatic. No obvious bleeds.  Likely dilutional.   Recommend f/u CT chest without contrast in Sept 2016 for f/u of lung nodule.  F/U compliance of Vit D. (discharged with 6 week Rx of Ergocalciferol)  F/U outpatient cardiology eval for umbilical hernia surgery  Significant Procedures: none  Significant  Labs and Imaging:   Recent Labs Lab 01/24/14 1947 01/26/14 0240 01/27/14 0448  WBC 6.6 7.3 5.5  HGB 14.0 11.7* 10.9*  HCT 39.9 34.1* 33.6*  PLT 215 133* 128*    Recent Labs Lab 01/23/14 1155 01/24/14 1947 01/25/14 0450 01/26/14 0240 01/27/14 0448  NA 132* 131* 130* 129* 129*  K 4.4 4.1 3.8 2.6* 3.9  CL 91* 90* 97 96 99  CO2 23 20 23 25 23   GLUCOSE 159* 144* 120* 133* 102*  BUN 14 12 12 6 8   CREATININE 0.91 0.72 0.88 0.89 0.87  CALCIUM 8.8 8.5 8.0* 7.9* 8.3*  MG  --   --   --  1.6  --   ALKPHOS 90 93  --  72 74  AST 43* 69*  --  70* 67*  ALT 23 28  --  27 30  ALBUMIN 3.7 3.7  --  3.0* 3.0*   CT from [facility] Ct Chest Wo Contrast  01/26/2014   CLINICAL DATA:  63YOM with suggested 3 month follow up For Chest CT in May 2015 regarding a non solid opacity in the posterior segment of the RUL, nonspecific-may be inflammatory in origin though non solid tumor is excluded. PMH= of CHF, COPD,Depression, Anxiety,HTN, RBBB, ETOH abuse and tobacco use  EXAM: CT CHEST WITHOUT CONTRAST  TECHNIQUE: Multidetector CT imaging of the chest was performed following the standard protocol without IV contrast.  COMPARISON:  CT, 06/14/2013  FINDINGS: Since the prior study, opacity in the left lower lobe has improved. There is still mild residual reticular opacity along the lateral aspect of the mid to upper left lower lobe.  On the right, cystic bronchiectasis in the right upper lobe with associated coarse reticular and irregular ill-defined airspace opacities have mildly improved. Area measured previously in the posterior right upper lobe, which is a somewhat more focal area of airspace and reticular opacity and mild bronchiectasis, is without change.  Milder bronchiectasis is noted in the right middle lobe and in the left upper lobe lingula. There are other areas of mild peripheral reticular scarring which are stable. There is stable parenchymal scarring at the lung apices. There are also stable areas of  paraseptal emphysema.  No new lung opacities.  No pleural effusion.  No pulmonary edema.  Heart is normal in size and configuration. There are dense coronary artery calcifications, stable. No neck base, axillary, mediastinal or hilar masses or pathologically enlarged lymph nodes.  Limited evaluation of the upper abdomen shows a stable low-density, 2.2 cm, upper pole renal lesion consistent with a cyst. There is fatty infiltration of the liver. A nonobstructing stone is noted in the lower pole of the left kidney, not included on the prior field of view.  Bones are demineralized. There is a compression fracture of L1, stable. Degenerative changes along the thoracic spine are stable. No osteoblastic or osteolytic lesions.  IMPRESSION: 1. Area of bronchiectasis and peribronchovascular opacity in left lower lobe has significantly improved from the prior study. 2. More severe bronchiectasis with associated coarse reticular and patchy airspace peribronchovascular opacities in the right upper lobe  have shown a more mild improvement. The more focal area of opacity in the posterior lateral right upper lobe is stable. 3. No new abnormalities on the chest CT. No discrete masses. No acute findings. 4. Recommend additional followup chest CT to document further stability/clearing of these abnormalities. Recommend CT in 9 months without contrast.   Electronically Signed   By: Amie Portland M.D.   On: 01/26/2014 14:43    Results/Tests Pending at Time of Discharge: none  Discharge Medications:    Medication List    STOP taking these medications        ASTHMANEFRIN IN     PRIMATENE ASTHMA 12.5-200 MG Tabs  Generic drug:  Ephedrine-Guaifenesin     Vitamin D-3 1000 UNITS Caps      TAKE these medications        acetaminophen 500 MG tablet  Commonly known as:  TYLENOL  Take 1,500 mg by mouth every 6 (six) hours as needed for headache (headache).     aspirin 81 MG EC tablet  Take 1 tablet (81 mg total) by mouth  daily.     atorvastatin 40 MG tablet  Commonly known as:  LIPITOR  Take 1 tablet by mouth daily at 6 pm. Needs office visit.     B-Complex Caps  Take 1 capsule by mouth daily.     carvedilol 6.25 MG tablet  Commonly known as:  COREG  Take 1 tablet (6.25 mg total) by mouth 2 (two) times daily with a meal. <please schedule appointment for future refills>     clonazePAM 0.5 MG tablet  Commonly known as:  KLONOPIN  Take 1 tablet (0.5 mg total) by mouth 2 (two) times daily as needed for anxiety (anxiety).     dicyclomine 20 MG tablet  Commonly known as:  BENTYL  Take 1 tablet (20 mg total) by mouth 3 (three) times daily before meals.     DULoxetine 30 MG capsule  Commonly known as:  CYMBALTA  Take 1 capsule (30 mg total) by mouth daily.     feeding supplement (RESOURCE BREEZE) Liqd  Take 1 Container by mouth 3 (three) times daily between meals. Chocolate or whatever flavor desired by patient     fluticasone 50 MCG/ACT nasal spray  Commonly known as:  FLONASE  Use two sprays in each nostril daily     furosemide 40 MG tablet  Commonly known as:  LASIX  Take 1 tablet by mouth daily. Needs office visit.     gabapentin 300 MG capsule  Commonly known as:  NEURONTIN  Take 1 capsule (300 mg total) by mouth 3 (three) times daily.     Ipratropium-Albuterol 20-100 MCG/ACT Aers respimat  Commonly known as:  COMBIVENT RESPIMAT  Inhale 2 puffs into the lungs every 4 (four) hours as needed for wheezing (wheezing).     lisinopril 20 MG tablet  Commonly known as:  PRINIVIL,ZESTRIL  Take 20 mg by mouth daily.     LORazepam 1 MG tablet  Commonly known as:  ATIVAN  Take 1 tablet (1 mg total) by mouth every 8 (eight) hours.     montelukast 10 MG tablet  Commonly known as:  SINGULAIR  Take 1 tablet (10 mg total) by mouth at bedtime.     multivitamin with minerals Tabs tablet  Take 1 tablet by mouth daily.     mupirocin ointment 2 %  Commonly known as:  BACTROBAN  Place 1 application  into the nose 2 (two) times daily.  potassium chloride SA 20 MEQ tablet  Commonly known as:  K-DUR,KLOR-CON  Take 2 tablets (40 mEq total) by mouth daily.     spironolactone 25 MG tablet  Commonly known as:  ALDACTONE  TAKE HALF TABLET BY MOUTH DAILY. needs appointment for further refills.     tiotropium 18 MCG inhalation capsule  Commonly known as:  SPIRIVA  Place 1 capsule (18 mcg total) into inhaler and inhale daily.     triamcinolone 0.025 % ointment  Commonly known as:  KENALOG  Apply 1 application topically 2 (two) times daily.     Vitamin D (Ergocalciferol) 50000 UNITS Caps capsule  Commonly known as:  DRISDOL  Take 1 capsule (50,000 Units total) by mouth every 7 (seven) days.        Discharge Instructions: Please refer to Patient Instructions section of EMR for full details.  Patient was counseled important signs and symptoms that should prompt return to medical care, changes in medications, dietary instructions, activity restrictions, and follow up appointments.   Follow-Up Appointments: Follow-up Information    Follow up with [facility], [facility]. Go on 02/11/2014.   Specialty:  Family Medicine   Why:  9:15am (hospital follow up)   Contact information:   [address] [place] [address] [phone]       Raliegh Ip, DO 01/30/2014, 8:27 AM PGY-1, [facility]

## 2014-01-25 NOTE — Progress Notes (Signed)
INITIAL NUTRITION ASSESSMENT  DOCUMENTATION CODES Per approved criteria  -Not Applicable   INTERVENTION: Continue Resource Breeze po TID, each supplement provides 250 kcal and 9 grams of protein  NUTRITION DIAGNOSIS: Inadequate oral intake related to altered GI function as evidenced by clear liquid diet.   Goal: Pt to meet >/= 90% of their estimated nutrition needs   Monitor:  Diet advancement, PO intake, supplement acceptance, weight trends, labs  Reason for Assessment: Pt identified as at nutrition risk on the Malnutrition Screen Tool  63 y.o. male  Admitting Dx: Gastroenteritis and ETOH abuse with withdrawal  ASSESSMENT: Pt presenting with nausea, vomiting, diarrhea, shortness of breath . PMH is significant for ETOH abuse, diastolic CHF, hepatomegaly, HTN, depression, anxiety, RBB, peripheral neuropathy, seizure d/t ETOH withdrawal.  Pt with hx of N/V/D during other admissions due to withdrawal.  Pt just had an episode of diarrhea and is being cleaned up by RN.  Pt consuming 100% of his clear liquid diet.    Height: Ht Readings from Last 1 Encounters:  01/25/14 5\' 8"  (1.727 m)    Weight: Wt Readings from Last 1 Encounters:  01/25/14 182 lb 15.7 oz (83 kg)    Ideal Body Weight: 70 kg   % Ideal Body Weight: 119%  Wt Readings from Last 10 Encounters:  01/25/14 182 lb 15.7 oz (83 kg)  01/04/14 192 lb 9.6 oz (87.363 kg)  12/14/13 183 lb 8 oz (83.235 kg)  12/01/13 186 lb 8 oz (84.596 kg)  11/19/13 188 lb 0.8 oz (85.3 kg)  10/26/13 188 lb 12.8 oz (85.639 kg)  08/03/13 187 lb 3.2 oz (84.913 kg)  06/29/13 179 lb 3.2 oz (81.285 kg)  06/17/13 188 lb 6.4 oz (85.458 kg)  11/26/12 176 lb (79.833 kg)    Usual Body Weight: 180-190 lb   % Usual Body Weight: 100%  BMI:  Body mass index is 27.83 kg/(m^2).  Estimated Nutritional Needs: Kcal: 2000-2200 Protein: 100-115 grams Fluid: > 2 L/day  Skin: skin cracking on heels  Diet Order: Diet clear liquid Meal  Completion: 100%  EDUCATION NEEDS: -No education needs identified at this time   Intake/Output Summary (Last 24 hours) at 01/25/14 1129 Last data filed at 01/25/14 1000  Gross per 24 hour  Intake 1101.67 ml  Output      2 ml  Net 1099.67 ml    Last BM: 12/21   Labs:   Recent Labs Lab 01/23/14 1155 01/24/14 1947 01/25/14 0450  NA 132* 131* 130*  K 4.4 4.1 3.8  CL 91* 90* 97  CO2 23 20 23   BUN 14 12 12   CREATININE 0.91 0.72 9.41  CALCIUM 8.8 8.5 8.0*  GLUCOSE 159* 144* 120*    CBG (last 3)  No results for input(s): GLUCAP in the last 72 hours.  Scheduled Meds: . sodium chloride   Intravenous STAT  . aspirin EC  81 mg Oral Daily  . atorvastatin  40 mg Oral q1800  . carvedilol  6.25 mg Oral Daily  . Chlorhexidine Gluconate Cloth  6 each Topical Q0600  . DULoxetine  30 mg Oral Daily  . feeding supplement (RESOURCE BREEZE)  240 mL Oral TID BM  . fluticasone  2 spray Each Nare Daily  . folic acid  1 mg Oral Daily  . furosemide  40 mg Oral Daily  . gabapentin  300 mg Oral TID  . heparin  5,000 Units Subcutaneous 3 times per day  . lisinopril  20 mg Oral Daily  .  LORazepam      . LORazepam  1 mg Oral 3 times per day  . montelukast  10 mg Oral QHS  . multivitamin with minerals  1 tablet Oral Daily  . mupirocin ointment  1 application Nasal BID  . [START ON 01/26/2014] spironolactone  25 mg Oral Daily  . thiamine  100 mg Oral Daily   Or  . thiamine  100 mg Intravenous Daily  . tiotropium  18 mcg Inhalation Daily  . triamcinolone  1 application Topical BID    Continuous Infusions: . sodium chloride 125 mL/hr at 01/25/14 0700    Past Medical History  Diagnosis Date  . CHF (congestive heart failure) 10/2010    ECHO:  EF 40%, Grade II diastolic dysfunction  . Alcoholism   . Allergy     seasonal   . Anxiety   . Depression   . Asthma   . Hyperlipidemia   . Hypertension   . Seizures     Past Surgical History  Procedure Laterality Date  . Removal of  ingrown toenail      Kendell BaneHeather Olegario Emberson RD, LDN, CNSC [phone] Pager [phone] After Hours Pager

## 2014-01-26 ENCOUNTER — Inpatient Hospital Stay (HOSPITAL_COMMUNITY): Payer: No Typology Code available for payment source

## 2014-01-26 DIAGNOSIS — J441 Chronic obstructive pulmonary disease with (acute) exacerbation: Secondary | ICD-10-CM

## 2014-01-26 DIAGNOSIS — E871 Hypo-osmolality and hyponatremia: Secondary | ICD-10-CM

## 2014-01-26 DIAGNOSIS — E876 Hypokalemia: Secondary | ICD-10-CM

## 2014-01-26 DIAGNOSIS — I10 Essential (primary) hypertension: Secondary | ICD-10-CM

## 2014-01-26 DIAGNOSIS — F10239 Alcohol dependence with withdrawal, unspecified: Principal | ICD-10-CM

## 2014-01-26 DIAGNOSIS — R911 Solitary pulmonary nodule: Secondary | ICD-10-CM

## 2014-01-26 DIAGNOSIS — G629 Polyneuropathy, unspecified: Secondary | ICD-10-CM

## 2014-01-26 LAB — COMPREHENSIVE METABOLIC PANEL
ALBUMIN: 3 g/dL — AB (ref 3.5–5.2)
ALT: 27 U/L (ref 0–53)
AST: 70 U/L — ABNORMAL HIGH (ref 0–37)
Alkaline Phosphatase: 72 U/L (ref 39–117)
Anion gap: 8 (ref 5–15)
BUN: 6 mg/dL (ref 6–23)
CALCIUM: 7.9 mg/dL — AB (ref 8.4–10.5)
CO2: 25 mmol/L (ref 19–32)
CREATININE: 0.89 mg/dL (ref 0.50–1.35)
Chloride: 96 mEq/L (ref 96–112)
GFR calc Af Amer: >90 mL/min (ref >90)
Glucose, Bld: 133 mg/dL — ABNORMAL HIGH (ref 70–99)
Potassium: 2.6 mmol/L — CL (ref 3.5–5.1)
Sodium: 129 mmol/L — ABNORMAL LOW (ref 135–145)
TOTAL PROTEIN: 5.9 g/dL — AB (ref 6.0–8.3)
Total Bilirubin: 1.4 mg/dL — ABNORMAL HIGH (ref 0.3–1.2)

## 2014-01-26 LAB — CBC
HEMATOCRIT: 34.1 % — AB (ref 39.0–52.0)
HEMOGLOBIN: 11.7 g/dL — AB (ref 13.0–17.0)
MCH: 32.6 pg (ref 26.0–34.0)
MCHC: 34.3 g/dL (ref 30.0–36.0)
MCV: 95 fL (ref 78.0–100.0)
PLATELETS: 133 10*3/uL — AB (ref 150–400)
RBC: 3.59 MIL/uL — AB (ref 4.22–5.81)
RDW: 13.8 % (ref 11.5–15.5)
WBC: 7.3 10*3/uL (ref 4.0–10.5)

## 2014-01-26 LAB — VITAMIN D 25 HYDROXY (VIT D DEFICIENCY, FRACTURES): Vit D, 25-Hydroxy: 15 ng/mL — ABNORMAL LOW (ref 30–100)

## 2014-01-26 LAB — MAGNESIUM: Magnesium: 1.6 mg/dL (ref 1.5–2.5)

## 2014-01-26 MED ORDER — POTASSIUM CHLORIDE CRYS ER 20 MEQ PO TBCR
40.0000 meq | EXTENDED_RELEASE_TABLET | Freq: Two times a day (BID) | ORAL | Status: AC
  Administered 2014-01-26 (×2): 40 meq via ORAL
  Filled 2014-01-26 (×2): qty 2

## 2014-01-26 MED ORDER — POTASSIUM CHLORIDE CRYS ER 20 MEQ PO TBCR
40.0000 meq | EXTENDED_RELEASE_TABLET | Freq: Two times a day (BID) | ORAL | Status: DC
  Administered 2014-01-26: 40 meq via ORAL
  Filled 2014-01-26: qty 2

## 2014-01-26 MED ORDER — POTASSIUM CHLORIDE CRYS ER 20 MEQ PO TBCR: 40.0000 meq | EXTENDED_RELEASE_TABLET | Freq: Once | ORAL | Status: DC

## 2014-01-26 MED ORDER — POTASSIUM CHLORIDE CRYS ER 20 MEQ PO TBCR
40.0000 meq | EXTENDED_RELEASE_TABLET | Freq: Every day | ORAL | Status: DC
  Administered 2014-01-27: 40 meq via ORAL
  Filled 2014-01-26: qty 2

## 2014-01-26 MED ORDER — CALCIUM CARBONATE ANTACID 500 MG PO CHEW
1.0000 | CHEWABLE_TABLET | Freq: Two times a day (BID) | ORAL | Status: DC
  Administered 2014-01-26 – 2014-01-27 (×3): 200 mg via ORAL
  Filled 2014-01-26 (×4): qty 1

## 2014-01-26 MED ORDER — POTASSIUM CHLORIDE CRYS ER 20 MEQ PO TBCR: 20.0000 meq | EXTENDED_RELEASE_TABLET | Freq: Two times a day (BID) | ORAL | Status: DC

## 2014-01-26 NOTE — Progress Notes (Signed)
Family Medicine Teaching Service Daily Progress Note Intern Pager: [phone]  Patient name: Juan [name] Medical record number: [id number] Date of birth: [birth year] Age: 63 y.o. Gender: male  Primary Care Provider: Renold DonWALDEN,JEFF, MD Consultants: none Code Status: FULL (discussed on admission)  Pt Overview and Major Events to Date:  12/23: K 2.6  Assessment and Plan: Juan [name] is a 63 y.o. male presenting with nausea, vomiting, diarrhea, shortness of breath . PMH is significant for ETOH abuse, diastolic CHF, hepatomegaly, HTN, depression, anxiety, RBB, peripheral neuropathy, seizure d/t ETOH withdrawal  ETOH abuse/ withdrawal. Patient has a h/o of seizure d/t ETOH withdrawal. CIWA scores 2, 0, 0.  -CIWA protocol -Thiamine, MVI, Folate -IVFs  -Zofran PRN nausea -monitor for seizures  Diarrhea: Improving.  No episodes since yesterday afternoon -On precautions -C diff pending -Monitoring for electrolyte abnormalities as outlined below  Hyponatremia: Na 131>130>129. Likely related to beer potomania  -repeat daily CMETs  Hypokalemia: K 2.6, Mg 1.2  -restart KDur 40meq daily. -recheck BMET at 2pm. Consider giving another 20meq. -Daily CMET  Hypocalcemia: Ca 7.9. Mg 1.2 -start calcium carbonate daily -daily CMET -PTH and Vit D levels pending  COPD: Doesn't appear to be having an exacerbation. Expiratory wheeze on exam but CXR clear. Bronchiectasis was seen on CT 06/14/13. Hx of aspiration pneumonia and with recent episodes of emesis. Would need to cover for aspiration if initiating ABX.  -Continue home Spiriva  -replaced Duoneb with Xopenex 12/22 -consider outpatient pulm f/u   Diastolic CHF: echo 09/2013 EF 14-78%60-65%. Does not appear fluid overloaded on exam. Reports some orthopnea and PND but hasn't changed the number of pillows that he sleeps with.  -Continue home lasix -Restart home potassium -strict I's and O's (4 unmeasured urines)  HTN: BP 114/70.   -Continue home Lisinopril, Lasix, Coreg  Peripheral Neuropathy:  -Continue home Gabapentin  Anxiety/depression:  -Continue Cymbalta, Klonopin, Ativan  Psoriasis: excoriations on b/l ankles exam -Continue home triamcinolone cr  Lung Nodule seen on imaging: noticed on CT scan 06/14/13 and hasn't had a follow up CT - CT chest w/out contrast ordered  Tobacco abuse: 60 pack year hx. Not motivated to quit.  - smoking cessation   Umbilical hernia: not incarcerated. Previously was encouraged to be cleared by Cardiology in order to have surgery.  - outpatient f/u with cardiology and surgery   FEN/GI: MIVF @125cc /hr, replete Ca and K as above, Heart healthy diet PPx: sub-q heparin  Disposition: Discharge once medically stable.  Anticipate either this evening or tomorrow.  Subjective:  Patient reports that he is feeling well today.  He reports that he has not had anymore diarrhea since yesterday afternoon. Denies tremors, weakness, fatigue, irritability, nausea or vomiting.  Patient is eager to advance diet from liquids to solids.  Objective: Temp:  [98.1 F (36.7 C)-99.3 F (37.4 C)] 98.1 F (36.7 C) (12/22 2000) Pulse Rate:  [92-123] 92 (12/23 0400) Resp:  [16-27] 16 (12/23 0400) BP: (114-176)/(70-108) 114/70 mmHg (12/23 0400) SpO2:  [85 %-99 %] 92 % (12/23 0400) Physical Exam: General: sleeping in bed, NAD Cardiovascular: RRR, no m/r/g, brisk cap refill Respiratory: global expiratory wheeze, no increased WOB, [place] in place Abdomen: protuberant, large umbilical hernia, +BS, NT/ND, soft Extremities: WWP, extensive dry, flaky skin with excoriation on b/l medial heels and ankles, non bleeding, +keratotic toenails, healing lesions on lateral aspect of R calf Neuro: AAOx3, follows commands, no clonus, sensation and strength grossly in tact, normal speech  Laboratory:  Recent Labs Lab 01/23/14 1155  01/24/14 1947 01/26/14 0240  WBC 5.5 6.6 7.3  HGB 14.6 14.0 11.7*  HCT 41.8  39.9 34.1*  PLT 235 215 133*    Recent Labs Lab 01/23/14 1155 01/24/14 1947 01/25/14 0450 01/26/14 0240  NA 132* 131* 130* 129*  K 4.4 4.1 3.8 2.6*  CL 91* 90* 97 96  CO2 23 20 23 25   BUN 14 12 12 6   CREATININE 0.91 0.72 0.88 0.89  CALCIUM 8.8 8.5 8.0* 7.9*  PROT 7.5 7.6  --  5.9*  BILITOT 0.7 1.0  --  1.4*  ALKPHOS 90 93  --  72  ALT 23 28  --  27  AST 43* 69*  --  70*  GLUCOSE 159* 144* 120* 133*   MRSA: POSITIVE C diff pending  Imaging/Diagnostic Tests: Ct Abdomen Pelvis Wo Contrast  01/23/2014   CLINICAL DATA:  Nausea, vomiting and diarrhea. History of abdominal hernia.  EXAM: CT ABDOMEN AND PELVIS WITHOUT CONTRAST  TECHNIQUE: Multidetector CT imaging of the abdomen and pelvis was performed following the standard protocol without IV contrast.  COMPARISON:  11/18/2013  FINDINGS: Lung bases are clear.  Negative for free air.  There are small calcifications in the left kidney with a 4 mm stone in the left kidney lower pole. No definite right kidney stones. Negative for hydronephrosis. Urinary bladder is moderately distended. Mild perinephric edema which has slightly progressed. Probable exophytic cyst in the left kidney upper pole.  Evidence for a low-density stone in the gallbladder measuring up to 1.6 cm. No gross abnormality to the liver, spleen, pancreas or adrenal glands. There is oral contrast in the stomach and proximal small bowel loops. No evidence to suggest obstruction. Again noted is a large ventral hernia containing fat. No acute inflammatory changes within this large ventral hernia.  Atherosclerotic calcifications in the aorta and visceral arteries without aneurysm. No gross abnormality to the prostate or seminal vesicles. No significant free fluid or lymphadenopathy. Normal appearance of the prostate. Mild the wall thickening of the right colon which is unchanged and no evidence for acute colonic inflammation.  No acute bone abnormality. Old compression fracture  involving the L1 vertebral body.  IMPRESSION: Large ventral hernia containing fat.  No complicating features.  No acute abnormalities within the abdomen or pelvis.  Cholelithiasis.  Left nephrolithiasis without hydronephrosis.   Electronically Signed   By: Richarda Overlie M.D.   On: 01/23/2014 15:59   Dg Chest 2 View  01/24/2014   CLINICAL DATA:  Initial evaluation for shortness of breath.  EXAM: CHEST  2 VIEW  COMPARISON:  Prior study from 01/23/2014  FINDINGS: The cardiac and mediastinal silhouettes are stable in size and contour, and remain within normal limits.  The lungs are normally inflated. No airspace consolidation, pleural effusion, or pulmonary edema is identified. There is no pneumothorax. Irregular biapical pleural thickening noted.  There is accentuation of the normal thoracic kyphosis. No acute osseus abnormality. Multilevel degenerative spurring present within the thoracic spine.  IMPRESSION: No active cardiopulmonary disease.   Electronically Signed   By: Rise Mu M.D.   On: 01/24/2014 23:31    Raliegh Ip, DO 01/26/2014, 6:16 AM PGY-1, [place] [facility] FPTS Intern pager: [phone], text pages welcome

## 2014-01-26 NOTE — Progress Notes (Signed)
Anxious at times

## 2014-01-26 NOTE — Progress Notes (Signed)
Abdominal hernia with wound

## 2014-01-26 NOTE — Progress Notes (Signed)
hernia

## 2014-01-27 DIAGNOSIS — J441 Chronic obstructive pulmonary disease with (acute) exacerbation: Secondary | ICD-10-CM | POA: Insufficient documentation

## 2014-01-27 LAB — PARATHYROID HORMONE, INTACT (NO CA): PTH: 60 pg/mL (ref 14–64)

## 2014-01-27 LAB — COMPREHENSIVE METABOLIC PANEL
ALBUMIN: 3 g/dL — AB (ref 3.5–5.2)
ALT: 30 U/L (ref 0–53)
AST: 67 U/L — ABNORMAL HIGH (ref 0–37)
Alkaline Phosphatase: 74 U/L (ref 39–117)
Anion gap: 7 (ref 5–15)
BUN: 8 mg/dL (ref 6–23)
CO2: 23 mmol/L (ref 19–32)
Calcium: 8.3 mg/dL — ABNORMAL LOW (ref 8.4–10.5)
Chloride: 99 mEq/L (ref 96–112)
Creatinine, Ser: 0.87 mg/dL (ref 0.50–1.35)
GFR calc Af Amer: >90 mL/min (ref >90)
GFR calc non Af Amer: >90 mL/min (ref >90)
Glucose, Bld: 102 mg/dL — ABNORMAL HIGH (ref 70–99)
Potassium: 3.9 mmol/L (ref 3.5–5.1)
Sodium: 129 mmol/L — ABNORMAL LOW (ref 135–145)
TOTAL PROTEIN: 5.9 g/dL — AB (ref 6.0–8.3)
Total Bilirubin: 0.8 mg/dL (ref 0.3–1.2)

## 2014-01-27 LAB — CBC
HEMATOCRIT: 33.6 % — AB (ref 39.0–52.0)
Hemoglobin: 10.9 g/dL — ABNORMAL LOW (ref 13.0–17.0)
MCH: 30.8 pg (ref 26.0–34.0)
MCHC: 32.4 g/dL (ref 30.0–36.0)
MCV: 94.9 fL (ref 78.0–100.0)
Platelets: 128 10*3/uL — ABNORMAL LOW (ref 150–400)
RBC: 3.54 MIL/uL — ABNORMAL LOW (ref 4.22–5.81)
RDW: 13.6 % (ref 11.5–15.5)
WBC: 5.5 10*3/uL (ref 4.0–10.5)

## 2014-01-27 LAB — CLOSTRIDIUM DIFFICILE BY PCR: CDIFFPCR: NEGATIVE

## 2014-01-27 MED ORDER — VITAMIN D (ERGOCALCIFEROL) 1.25 MG (50000 UNIT) PO CAPS
50000.0000 [IU] | ORAL_CAPSULE | ORAL | Status: DC
  Filled 2014-01-27: qty 1

## 2014-01-27 MED ORDER — MUPIROCIN 2 % EX OINT: 1.0000 "application " | TOPICAL_OINTMENT | Freq: Two times a day (BID) | CUTANEOUS | Status: DC

## 2014-01-27 MED ORDER — SALINE SPRAY 0.65 % NA SOLN
1.0000 | NASAL | Status: DC | PRN
  Administered 2014-01-27: 1 via NASAL
  Filled 2014-01-27: qty 44

## 2014-01-27 MED ORDER — VITAMIN D (ERGOCALCIFEROL) 1.25 MG (50000 UNIT) PO CAPS: 50000.0000 [IU] | ORAL_CAPSULE | ORAL | Status: DC

## 2014-01-27 NOTE — Discharge Instructions (Signed)
We are so glad to see that you are doing better.  You were admitted for alcohol withdrawal and shortness of breath.  We recommend that you consider outpatient substance abuse treatment, like Alcoholics Anonymous.  You were also found to be Vitamin D deficient.  You have been prescribed a vitamin d supplement to take for the next 6 weeks.  We have scheduled an appointment for you to follow up with your PCP in the first week of January.  Please make sure to go to this appointment.   Alcohol Withdrawal Alcohol withdrawal happens when you normally drink alcohol a lot and suddenly stop drinking. Alcohol withdrawal symptoms can be mild to very bad. Mild withdrawal symptoms can include feeling sick to your stomach (nauseous), headache, or feeling irritable. Bad withdrawal symptoms can include shakiness, being very nervous (anxious), and not thinking clearly.  HOME CARE  Join an alcohol support group.  Stay away from people or situations that make you want to drink.  Eat a healthy diet. Eat a lot of fresh fruits, vegetables, and lean meats. GET HELP RIGHT AWAY IF:   You become confused. You start to see and hear things that are not really there.  You feel your heart beating very fast.  You throw up (vomit) blood or cannot stop throwing up. This may be bright red or look like black coffee grounds.  You have blood in your poop (stool). This may be bright red, maroon colored, or black and tarry.  You are lightheaded or pass out (faint).  You develop a fever. MAKE SURE YOU:   Understand these instructions.  Will watch your condition.  Will get help right away if you are not doing well or get worse. Document Released: 07/10/2007 Document Revised: 04/15/2011 Document Reviewed: 07/10/2007 [facility] Patient Information 2015 [place], [place]. This information is not intended to replace advice given to you by your health care provider. Make sure you discuss any questions you have with your health care  provider.

## 2014-01-27 NOTE — Progress Notes (Signed)
Family Medicine Teaching Service Daily Progress Note Intern Pager: [phone]  Patient name: Juan [name] Medical record number: [id number] Date of birth: 1951/01/08 Age: 63 y.o. Gender: male  Primary Care Provider: Renold Don, MD Consultants: none Code Status: FULL (discussed on admission)  Pt Overview and Major Events to Date:  12/23: K 2.6, transferred to telemetry  Assessment and Plan: DOC TEA is a 63 y.o. male presenting with nausea, vomiting, diarrhea, shortness of breath . PMH is significant for ETOH abuse, diastolic CHF, hepatomegaly, HTN, depression, anxiety, RBB, peripheral neuropathy, seizure d/t ETOH withdrawal  ETOH abuse/ withdrawal. Patient has a h/o of seizure d/t ETOH withdrawal. CIWA scores 1, 1, 0, 8, 0 .Patient did not require PRN ativan overnight and is on home regimen.  -CIWA protocol -Thiamine, MVI, Folate -IVFs  -Zofran PRN nausea -monitor for seizures  Thrombocytopenia: Platelets 133> 128. On sub-q heparin. -Continue to monitor.  Hold heparin if continued decrease in platelets.  Diarrhea: Improving.  No episodes since yesterday afternoon. -On precautions -C diff pending collection -Monitoring for electrolyte abnormalities as outlined below  Hyponatremia: Na 131>130>129>129. Likely related to beer potomania  -repeat daily CMETs  Hypokalemia: K 2.6>3.9, Mg 1.2  -KDur daily. -Daily CMET  Hypocalcemia: Ca 7.9>8.3. Mg 1.2 -calcium carbonate daily -daily CMET -PTH levels pending  Vitamin D deficiency: Vit D level 15 -Begin repletion with Ergocalciferol 50,000 units PO every week x6 weeks  COPD: Doesn't appear to be having an exacerbation. Expiratory wheeze on exam but CXR clear. Bronchiectasis was seen on CT 06/14/13. Hx of aspiration pneumonia and with recent episodes of emesis. Would need to cover for aspiration if initiating ABX.  Patient on 2.5L O2, does not use O2 at home. -Continue home Spiriva  -replaced Duoneb with  Xopenex 12/22 -consider outpatient pulm f/u  -wean O2 as tolerated  Diastolic CHF: echo 09/2013 EF 62-86%. Does not appear fluid overloaded on exam. Reports some orthopnea and PND but hasn't changed the number of pillows that he sleeps with.  -Continue home lasix -Restart home potassium -strict I's and O's (2 unmeasured urines)  HTN: BP 127/73. HR 92  -Continue home Lisinopril, Lasix, Coreg  Peripheral Neuropathy:  -Continue home Gabapentin  Anxiety/depression:  -Continue Cymbalta, Klonopin, Ativan  Psoriasis: excoriations on b/l ankles exam, improving -Continue home triamcinolone cr  Lung Nodule seen on imaging: noticed on CT scan 06/14/13. Repeat CT chest w/out contrast stable lung opacity/nodule.  -Recommend repeat CT w/out contrast in 9 months  Tobacco abuse: 60 pack year hx. Not motivated to quit.  - smoking cessation   Umbilical hernia: not incarcerated. Previously was encouraged to be cleared by Cardiology in order to have surgery.  - outpatient f/u with cardiology and surgery   FEN/GI: MIVF @125cc /hr, replete Ca and K as above, Heart healthy diet PPx: sub-q heparin  Disposition: Discharge once medically stable.  Anticipate discharge today.  Subjective:  Patient reports that he is feeling well today.  He reports that he has not had anymore diarrhea since yesterday afternoon. Denies tremors, weakness, fatigue, irritability, nausea or vomiting.  Patient reports that he continues to have spells of SOB, but otherwise voices no concerns. Objective: Temp:  [98.3 F (36.8 C)-98.7 F (37.1 C)] 98.6 F (37 C) (12/23 2104) Pulse Rate:  [91-111] 111 (12/23 2104) Resp:  [13-23] 19 (12/23 2104) BP: (122-138)/(74-87) 131/78 mmHg (12/23 2104) SpO2:  [88 %-97 %] 95 % (12/23 2104) Weight:  [182 lb 6.9 oz (82.75 kg)-182 lb 14.4 oz (82.963 kg)] 182 lb  6.9 oz (82.75 kg) (12/23 2104) Physical Exam: General: disheveled appearing, sleeping in bed, NAD, nurse's aid at  bedside Cardiovascular: RRR, no m/r/g, brisk cap refill Respiratory: global expiratory wheeze, no increased WOB, [place] in place Abdomen: protuberant, large umbilical hernia, +BS, NT/ND, soft Extremities: WWP, extensive dry, flaky skin with excoriation on b/l medial heels and ankles, non bleeding, +keratotic toenails, healing lesions on lateral aspect of R calf Neuro: AAOx3, follows commands, no clonus, sensation and strength grossly in tact, normal speech, very mild hand tremor   Laboratory:  Recent Labs Lab 01/23/14 1155 01/24/14 1947 01/26/14 0240  WBC 5.5 6.6 7.3  HGB 14.6 14.0 11.7*  HCT 41.8 39.9 34.1*  PLT 235 215 133*    Recent Labs Lab 01/23/14 1155 01/24/14 1947 01/25/14 0450 01/26/14 0240  NA 132* 131* 130* 129*  K 4.4 4.1 3.8 2.6*  CL 91* 90* 97 96  CO2 23 20 23 25   BUN 14 12 12 6   CREATININE 0.91 0.72 0.88 0.89  CALCIUM 8.8 8.5 8.0* 7.9*  PROT 7.5 7.6  --  5.9*  BILITOT 0.7 1.0  --  1.4*  ALKPHOS 90 93  --  72  ALT 23 28  --  27  AST 43* 69*  --  70*  GLUCOSE 159* 144* 120* 133*   MRSA: POSITIVE C diff pending  Imaging/Diagnostic Tests: Ct Abdomen Pelvis Wo Contrast  01/23/2014   CLINICAL DATA:  Nausea, vomiting and diarrhea. History of abdominal hernia.  EXAM: CT ABDOMEN AND PELVIS WITHOUT CONTRAST  TECHNIQUE: Multidetector CT imaging of the abdomen and pelvis was performed following the standard protocol without IV contrast.  COMPARISON:  11/18/2013  FINDINGS: Lung bases are clear.  Negative for free air.  There are small calcifications in the left kidney with a 4 mm stone in the left kidney lower pole. No definite right kidney stones. Negative for hydronephrosis. Urinary bladder is moderately distended. Mild perinephric edema which has slightly progressed. Probable exophytic cyst in the left kidney upper pole.  Evidence for a low-density stone in the gallbladder measuring up to 1.6 cm. No gross abnormality to the liver, spleen, pancreas or adrenal glands.  There is oral contrast in the stomach and proximal small bowel loops. No evidence to suggest obstruction. Again noted is a large ventral hernia containing fat. No acute inflammatory changes within this large ventral hernia.  Atherosclerotic calcifications in the aorta and visceral arteries without aneurysm. No gross abnormality to the prostate or seminal vesicles. No significant free fluid or lymphadenopathy. Normal appearance of the prostate. Mild the wall thickening of the right colon which is unchanged and no evidence for acute colonic inflammation.  No acute bone abnormality. Old compression fracture involving the L1 vertebral body.  IMPRESSION: Large ventral hernia containing fat.  No complicating features.  No acute abnormalities within the abdomen or pelvis.  Cholelithiasis.  Left nephrolithiasis without hydronephrosis.   Electronically Signed   By: Richarda Overlie M.D.   On: 01/23/2014 15:59   Dg Chest 2 View  01/24/2014   CLINICAL DATA:  Initial evaluation for shortness of breath.  EXAM: CHEST  2 VIEW  COMPARISON:  Prior study from 01/23/2014  FINDINGS: The cardiac and mediastinal silhouettes are stable in size and contour, and remain within normal limits.  The lungs are normally inflated. No airspace consolidation, pleural effusion, or pulmonary edema is identified. There is no pneumothorax. Irregular biapical pleural thickening noted.  There is accentuation of the normal thoracic kyphosis. No acute osseus abnormality. Multilevel  degenerative spurring present within the thoracic spine.  IMPRESSION: No active cardiopulmonary disease.   Electronically Signed   By: Rise MuBenjamin  McClintock M.D.   On: 01/24/2014 23:31   Ct Chest Wo Contrast  01/26/2014   CLINICAL DATA:  63YOM with suggested 3 month follow up For Chest CT in May 2015 regarding a non solid opacity in the posterior segment of the RUL, nonspecific-may be inflammatory in origin though non solid tumor is excluded. PMH= of CHF, COPD,Depression,  Anxiety,HTN, RBBB, ETOH abuse and tobacco use  EXAM: CT CHEST WITHOUT CONTRAST  TECHNIQUE: Multidetector CT imaging of the chest was performed following the standard protocol without IV contrast.  COMPARISON:  CT, 06/14/2013  FINDINGS: Since the prior study, opacity in the left lower lobe has improved. There is still mild residual reticular opacity along the lateral aspect of the mid to upper left lower lobe.  On the right, cystic bronchiectasis in the right upper lobe with associated coarse reticular and irregular ill-defined airspace opacities have mildly improved. Area measured previously in the posterior right upper lobe, which is a somewhat more focal area of airspace and reticular opacity and mild bronchiectasis, is without change.  Milder bronchiectasis is noted in the right middle lobe and in the left upper lobe lingula. There are other areas of mild peripheral reticular scarring which are stable. There is stable parenchymal scarring at the lung apices. There are also stable areas of paraseptal emphysema.  No new lung opacities.  No pleural effusion.  No pulmonary edema.  Heart is normal in size and configuration. There are dense coronary artery calcifications, stable. No neck base, axillary, mediastinal or hilar masses or pathologically enlarged lymph nodes.  Limited evaluation of the upper abdomen shows a stable low-density, 2.2 cm, upper pole renal lesion consistent with a cyst. There is fatty infiltration of the liver. A nonobstructing stone is noted in the lower pole of the left kidney, not included on the prior field of view.  Bones are demineralized. There is a compression fracture of L1, stable. Degenerative changes along the thoracic spine are stable. No osteoblastic or osteolytic lesions.  IMPRESSION: 1. Area of bronchiectasis and peribronchovascular opacity in left lower lobe has significantly improved from the prior study. 2. More severe bronchiectasis with associated coarse reticular and patchy  airspace peribronchovascular opacities in the right upper lobe have shown a more mild improvement. The more focal area of opacity in the posterior lateral right upper lobe is stable. 3. No new abnormalities on the chest CT. No discrete masses. No acute findings. 4. Recommend additional followup chest CT to document further stability/clearing of these abnormalities. Recommend CT in 9 months without contrast.   Electronically Signed   By: Amie Portlandavid  Ormond M.D.   On: 01/26/2014 14:43    Raliegh IpAshly M Tennis Mckinnon, DO 01/27/2014, 5:05 AM PGY-1, [facility] FPTS Intern pager: [phone], text pages welcome

## 2014-01-27 NOTE — Progress Notes (Signed)
Discharge instructions and D/C medications reviewed with the patient.  Follow up appointments reviewed with the patient.  Patient voices understanding to teaching. To door via wheelchair.   Home via taxi.

## 2014-01-30 ENCOUNTER — Other Ambulatory Visit: Payer: Self-pay | Admitting: Internal Medicine

## 2014-01-31 ENCOUNTER — Encounter: Payer: Self-pay | Admitting: Family Medicine

## 2014-01-31 ENCOUNTER — Ambulatory Visit (INDEPENDENT_AMBULATORY_CARE_PROVIDER_SITE_OTHER): Payer: No Typology Code available for payment source | Admitting: Family Medicine

## 2014-01-31 VITALS — BP 120/79 | HR 94 | Temp 98.7°F | Ht 68.0 in | Wt 186.6 lb

## 2014-01-31 DIAGNOSIS — J441 Chronic obstructive pulmonary disease with (acute) exacerbation: Secondary | ICD-10-CM

## 2014-01-31 DIAGNOSIS — F1010 Alcohol abuse, uncomplicated: Secondary | ICD-10-CM

## 2014-01-31 LAB — CBC WITH DIFFERENTIAL/PLATELET
Basophils Absolute: 0.1 10*3/uL (ref 0.0–0.1)
Basophils Relative: 1 % (ref 0–1)
Eosinophils Absolute: 0.8 10*3/uL — ABNORMAL HIGH (ref 0.0–0.7)
Eosinophils Relative: 9 % — ABNORMAL HIGH (ref 0–5)
HEMATOCRIT: 38.6 % — AB (ref 39.0–52.0)
Hemoglobin: 13 g/dL (ref 13.0–17.0)
Lymphocytes Relative: 18 % (ref 12–46)
Lymphs Abs: 1.5 10*3/uL (ref 0.7–4.0)
MCH: 32.5 pg (ref 26.0–34.0)
MCHC: 33.7 g/dL (ref 30.0–36.0)
MCV: 96.5 fL (ref 78.0–100.0)
MPV: 9.8 fL (ref 9.4–12.4)
Monocytes Absolute: 1.5 10*3/uL — ABNORMAL HIGH (ref 0.1–1.0)
Monocytes Relative: 17 % — ABNORMAL HIGH (ref 3–12)
NEUTROS ABS: 4.7 10*3/uL (ref 1.7–7.7)
NEUTROS PCT: 55 % (ref 43–77)
Platelets: 261 10*3/uL (ref 150–400)
RBC: 4 MIL/uL — AB (ref 4.22–5.81)
RDW: 14.3 % (ref 11.5–15.5)
WBC: 8.6 10*3/uL (ref 4.0–10.5)

## 2014-01-31 MED ORDER — PREDNISONE 50 MG PO TABS: 50.0000 mg | ORAL_TABLET | Freq: Every day | ORAL | Status: DC

## 2014-01-31 MED ORDER — DOXYCYCLINE HYCLATE 100 MG PO TABS: 100.0000 mg | ORAL_TABLET | Freq: Two times a day (BID) | ORAL | Status: DC

## 2014-01-31 NOTE — Assessment & Plan Note (Signed)
History and exam consistent with COPD exacerbation since leaving the hospital Discussed with attending who feels we do not need to cover for anaerobes so will treat the usual fashion with prednisone and doxycycline. Discussed red flags for return and seek more medical care.

## 2014-01-31 NOTE — Telephone Encounter (Signed)
Rx refill sent to patient pharmacy   

## 2014-01-31 NOTE — Assessment & Plan Note (Signed)
Has started drinking again since leaving the hospital Encouraged him to seek help at the [facility], which is his idea Follow-up with PCP re: scheduled on January 8, encouraged him to keep that

## 2014-01-31 NOTE — Progress Notes (Signed)
Patient ID: Juan [name], male   DOB: 1950-03-03, 63 y.o.   MRN: [id number]   HPI  Patient presents today for hospital follow-up  He was admitted on 01/25/2014 for ethanol abuse and gastroenteritis. History of heavy benzodiazepines and slowly weaned to home dose.   Since leaving the hospital he has started drinking again, he approximates 5-6 ounces of bourbon daily. Previously he was drinking 10-12 ounces a day. He has considered going to the [facility] for treatment.  Cough Patient states that he developed mild wheeze on the day of discharge. Since leaving he states that the wheeze is very become more prominent, he's developed intermittent dyspnea, and very productive cough. He states this is much more productive than his baseline cough. He denies fevers, chills, chest pain He has normal by mouth intake.  He is taking Spiriva and albuterol per day, albuterol is helping his cough and breathing.  Smoking status noted ROS: Per HPI  Objective: BP 120/79 mmHg  Pulse 94  Temp(Src) 98.7 F (37.1 C) (Oral)  Ht 5\' 8"  (1.727 m)  Wt 186 lb 9.6 oz (84.641 kg)  BMI 28.38 kg/m2 Gen: NAD, alert, cooperative with exam HEENT: NCAT, nares without significantly swollen turbinates CV: RRR, good S1/S2, no murmur Resp: Nonlabored, inspiratory and expiratory wheezes throughout Ext: Trace leg edema bilaterally Neuro: Alert and oriented, No gross deficits  Assessment and plan:  COPD exacerbation History and exam consistent with COPD exacerbation since leaving the hospital Discussed with attending who feels we do not need to cover for anaerobes so will treat the usual fashion with prednisone and doxycycline. Discussed red flags for return and seek more medical care.   ETOH abuse Has started drinking again since leaving the hospital Encouraged him to seek help at the [facility], which is his idea Follow-up with PCP re: scheduled on January 8, encouraged him to keep that    No  orders of the defined types were placed in this encounter.    Meds ordered this encounter  Medications  . predniSONE (DELTASONE) 50 MG tablet    Sig: Take 1 tablet (50 mg total) by mouth daily with breakfast.    Dispense:  5 tablet    Refill:  0  . doxycycline (VIBRA-TABS) 100 MG tablet    Sig: Take 1 tablet (100 mg total) by mouth 2 (two) times daily.    Dispense:  14 tablet    Refill:  0

## 2014-01-31 NOTE — Patient Instructions (Signed)
Great to meet you!  I would recommend you contact the [facility] like you were talking about  I think you are having a COPD exacerbation, I have sent an antibiotic and prednisone to your pharmacy.   Please Come back to see Dr. Gwendolyn Grant in 3-4 weeks.   Chronic Obstructive Pulmonary Disease Chronic obstructive pulmonary disease (COPD) is a common lung problem. In COPD, the flow of air from the lungs is limited. The way your lungs work will probably never return to normal, but there are things you can do to improve your lungs and make yourself feel better. HOME CARE  Take all medicines as told by your doctor.  Avoid medicines or cough syrups that dry up your airway (such as antihistamines) and do not allow you to get rid of thick spit. You do not need to avoid them if told differently by your doctor.  If you smoke, stop. Smoking makes the problem worse.  Avoid being around things that make your breathing worse (like smoke, chemicals, and fumes).  Use oxygen therapy and therapy to help improve your lungs (pulmonary rehabilitation) if told by your doctor. If you need home oxygen therapy, ask your doctor if you should buy a tool to measure your oxygen level (oximeter).  Avoid people who have a sickness you can catch (contagious).  Avoid going outside when it is very hot, cold, or humid.  Eat healthy foods. Eat smaller meals more often. Rest before meals.  Stay active, but remember to also rest.  Make sure to get all the shots (vaccines) your doctor recommends. Ask your doctor if you need a pneumonia shot.  Learn and use tips on how to relax.  Learn and use tips on how to control your breathing as told by your doctor. Try:  Breathing in (inhaling) through your nose for 1 second. Then, pucker your lips and breath out (exhale) through your lips for 2 seconds.  Putting one hand on your belly (abdomen). Breathe in slowly through your nose for 1 second. Your hand on your belly should  move out. Pucker your lips and breathe out slowly through your lips. Your hand on your belly should move in as you breathe out.  Learn and use controlled coughing to clear thick spit from your lungs. The steps are: 1. Lean your head a little forward. 2. Breathe in deeply. 3. Try to hold your breath for 3 seconds. 4. Keep your mouth slightly open while coughing 2 times. 5. Spit any thick spit out into a tissue. 6. Rest and do the steps again 1 or 2 times as needed. GET HELP IF:  You cough up more thick spit than usual.  There is a change in the color or thickness of the spit.  It is harder to breathe than usual.  Your breathing is faster than usual. GET HELP RIGHT AWAY IF:   You have shortness of breath while resting.  You have shortness of breath that stops you from:  Being able to talk.  Doing normal activities.  You chest hurts for longer than 5 minutes.  Your skin color is more blue than usual.  Your pulse oximeter shows that you have low oxygen for longer than 5 minutes. MAKE SURE YOU:   Understand these instructions.  Will watch your condition.  Will get help right away if you are not doing well or get worse. Document Released: 07/10/2007 Document Revised: 06/07/2013 Document Reviewed: 09/17/2012 [facility] Patient Information 2015 [place], [place]. This information is not intended  to replace advice given to you by your health care provider. Make sure you discuss any questions you have with your health care provider.  

## 2014-02-01 LAB — COMPREHENSIVE METABOLIC PANEL
ALBUMIN: 3.9 g/dL (ref 3.5–5.2)
ALT: 48 U/L (ref 0–53)
AST: 38 U/L — AB (ref 0–37)
Alkaline Phosphatase: 81 U/L (ref 39–117)
BUN: 12 mg/dL (ref 6–23)
CALCIUM: 9.1 mg/dL (ref 8.4–10.5)
CO2: 22 mEq/L (ref 19–32)
CREATININE: 0.96 mg/dL (ref 0.50–1.35)
Chloride: 93 mEq/L — ABNORMAL LOW (ref 96–112)
GLUCOSE: 114 mg/dL — AB (ref 70–99)
Potassium: 4.8 mEq/L (ref 3.5–5.3)
Sodium: 130 mEq/L — ABNORMAL LOW (ref 135–145)
TOTAL PROTEIN: 7.3 g/dL (ref 6.0–8.3)
Total Bilirubin: 0.3 mg/dL (ref 0.2–1.2)

## 2014-02-09 ENCOUNTER — Ambulatory Visit (INDEPENDENT_AMBULATORY_CARE_PROVIDER_SITE_OTHER): Payer: No Typology Code available for payment source | Admitting: Family Medicine

## 2014-02-09 ENCOUNTER — Other Ambulatory Visit: Payer: Self-pay | Admitting: Internal Medicine

## 2014-02-09 ENCOUNTER — Encounter: Payer: Self-pay | Admitting: Family Medicine

## 2014-02-09 VITALS — BP 145/93 | HR 119 | Temp 97.7°F | Ht 68.0 in | Wt 182.1 lb

## 2014-02-09 DIAGNOSIS — E871 Hypo-osmolality and hyponatremia: Secondary | ICD-10-CM

## 2014-02-09 DIAGNOSIS — F1010 Alcohol abuse, uncomplicated: Secondary | ICD-10-CM

## 2014-02-09 DIAGNOSIS — F1020 Alcohol dependence, uncomplicated: Secondary | ICD-10-CM

## 2014-02-09 LAB — COMPREHENSIVE METABOLIC PANEL
ALBUMIN: 3.9 g/dL (ref 3.5–5.2)
ALT: 26 U/L (ref 0–53)
AST: 30 U/L (ref 0–37)
Alkaline Phosphatase: 73 U/L (ref 39–117)
BILIRUBIN TOTAL: 0.3 mg/dL (ref 0.2–1.2)
BUN: 11 mg/dL (ref 6–23)
CALCIUM: 9 mg/dL (ref 8.4–10.5)
CO2: 20 mEq/L (ref 19–32)
CREATININE: 0.68 mg/dL (ref 0.50–1.35)
Chloride: 93 mEq/L — ABNORMAL LOW (ref 96–112)
GLUCOSE: 150 mg/dL — AB (ref 70–99)
Potassium: 4.8 mEq/L (ref 3.5–5.3)
Sodium: 127 mEq/L — ABNORMAL LOW (ref 135–145)
Total Protein: 6.8 g/dL (ref 6.0–8.3)

## 2014-02-09 NOTE — Patient Instructions (Addendum)
Thank you for coming in,   I have made a referral to home health for evaluation. This will occur in 2-3 days.   I have also sent a message to our social worker that will help [place] with any options.   I will call you with the results of the labs from today.    Please feel free to call with any questions or concerns at any time, at [phone]. --Dr. Jordan Likes

## 2014-02-09 NOTE — Progress Notes (Signed)
Subjective:    Patient ID: Juan [name], male    DOB: December 03, 1950, 64 y.o.   MRN: [id number]  HPI  Juan [name] is here for malaise and nausea.   Patient was recently discharged from the hospital. He was admitted for nausea, diarrhea and having withdrawals from alcohol. He improved and since being home, has been ok until Saturday night. He started drinking again and on Saturday night he began to feel nauseous again and anorexic. He stays at home while his wife goes to work and doesn't eat much of anything. He is also having some indigestion. He started drinking 10 oz of bourbon again. He has no thoughts of SI but his wife reports that that he apathetic in terms of his life. He states that he is mentally ill. He reported last visit that he was going to the [facility] but hasn't gone yet. He didn't qualify to go to the [facility] due to his hernia. [facility]. Denise any fever, chills or night sweats.  Patient has had recent falls and has a healing laceration on his forehead. He doesn't know how he fell but feels more weak than usual. He has been hospitalized 4 times in the past year.    Current Outpatient Prescriptions on File Prior to Visit  Medication Sig Dispense Refill  . acetaminophen (TYLENOL) 500 MG tablet Take 1,500 mg by mouth every 6 (six) hours as needed for headache (headache).     Marland Kitchen aspirin EC 81 MG EC tablet Take 1 tablet (81 mg total) by mouth daily. 90 tablet 0  . atorvastatin (LIPITOR) 40 MG tablet Take 1 tablet by mouth daily at 6 pm. Needs office visit. 30 tablet 0  . B-Complex CAPS Take 1 capsule by mouth daily.    . carvedilol (COREG) 6.25 MG tablet Take 1 tablet (6.25 mg total) by mouth 2 (two) times daily with a meal. MUST KEEP APPOINTMENT 02/17/2014 WITH DR HILTY FOR FUTURE REFILLS. 30 tablet 0  . clonazePAM (KLONOPIN) 0.5 MG tablet Take 1 tablet (0.5 mg total) by mouth 2 (two) times daily as needed for anxiety (anxiety).  30 tablet 0  . dicyclomine (BENTYL) 20 MG tablet Take 1 tablet (20 mg total) by mouth 3 (three) times daily before meals. 90 tablet 0  . doxycycline (VIBRA-TABS) 100 MG tablet Take 1 tablet (100 mg total) by mouth 2 (two) times daily. 14 tablet 0  . DULoxetine (CYMBALTA) 30 MG capsule Take 1 capsule (30 mg total) by mouth daily. 30 capsule 3  . feeding supplement, RESOURCE BREEZE, (RESOURCE BREEZE) LIQD Take 1 Container by mouth 3 (three) times daily between meals. Chocolate or whatever flavor desired by patient 237 mL 0  . fluticasone (FLONASE) 50 MCG/ACT nasal spray Use two sprays in each nostril daily 16 g 11  . furosemide (LASIX) 40 MG tablet Take 1 tablet by mouth daily. Needs office visit. 30 tablet 0  . gabapentin (NEURONTIN) 300 MG capsule Take 1 capsule (300 mg total) by mouth 3 (three) times daily. 90 capsule 0  . Ipratropium-Albuterol (COMBIVENT RESPIMAT) 20-100 MCG/ACT AERS respimat Inhale 2 puffs into the lungs every 4 (four) hours as needed for wheezing (wheezing). 4 g 3  . lisinopril (PRINIVIL,ZESTRIL) 20 MG tablet Take 1 tablet (20 mg total) by mouth daily. MUST KEEP APPOINTMENT 02/17/2014 WITH DR HILTY FOR FUTURE REFILLS. 15 tablet 0  . LORazepam (ATIVAN) 1 MG tablet Take 1 tablet (1 mg total) by  mouth every 8 (eight) hours. 24 tablet 0  . montelukast (SINGULAIR) 10 MG tablet Take 1 tablet (10 mg total) by mouth at bedtime. 30 tablet 3  . Multiple Vitamin (MULTIVITAMIN WITH MINERALS) TABS tablet Take 1 tablet by mouth daily.    . mupirocin ointment (BACTROBAN) 2 % Place 1 application into the nose 2 (two) times daily. 22 g 0  . potassium chloride SA (K-DUR,KLOR-CON) 20 MEQ tablet TAKE TWO TABLETS BY MOUTH DAILY  60 tablet 0  . predniSONE (DELTASONE) 50 MG tablet Take 1 tablet (50 mg total) by mouth daily with breakfast. 5 tablet 0  . spironolactone (ALDACTONE) 25 MG tablet TAKE HALF TABLET BY MOUTH DAILY. needs appointment for further refills. 15 tablet 0  . tiotropium (SPIRIVA) 18  MCG inhalation capsule Place 1 capsule (18 mcg total) into inhaler and inhale daily. 30 capsule 6  . triamcinolone (KENALOG) 0.025 % ointment Apply 1 application topically 2 (two) times daily. (Patient taking differently: Apply 1 application topically 2 (two) times daily as needed (for rash). ) 30 g 0  . Vitamin D, Ergocalciferol, (DRISDOL) 50000 UNITS CAPS capsule Take 1 capsule (50,000 Units total) by mouth every 7 (seven) days. 6 capsule 0  . [DISCONTINUED] albuterol (PROVENTIL,VENTOLIN) 90 MCG/ACT inhaler Inhale 2 puffs into the lungs every 6 (six) hours as needed for wheezing. 17 g 0   No current facility-administered medications on file prior to visit.    Review of Systems See HPI     Objective:   Physical Exam BP 145/93 mmHg  Pulse 119  Temp(Src) 97.7 F (36.5 C) (Oral)  Ht 5\' 8"  (1.727 m)  Wt 182 lb 1.6 oz (82.6 kg)  BMI 27.69 kg/m2 Gen: NAD, alert, unsteady gait CV: tachycardic, regular rhythm, capillary refill brisk  Resp: CTABL, no wheezes, non-labored, no crackles  Abd: anterior abdominal hernia, not incarcerated.  Psych: flat affect, poor mood       Assessment & Plan:

## 2014-02-09 NOTE — Telephone Encounter (Signed)
Rx(s) sent to pharmacy electronically.  

## 2014-02-10 ENCOUNTER — Telehealth: Payer: Self-pay | Admitting: *Deleted

## 2014-02-10 NOTE — Telephone Encounter (Signed)
-----   Message from Myra Rude, MD sent at 02/10/2014 12:10 PM EST ----- Please call patient and let him know that his sodium is low but at his baseline. Will need to check again in 2 weeks. Thanks.

## 2014-02-10 NOTE — Assessment & Plan Note (Signed)
Checked Na again. Remains asymptomatic and most likely related to his drinking. His Na increased when he was admitted.  - CMP: decrease to 127 - recheck in 2 weeks.

## 2014-02-10 NOTE — Telephone Encounter (Signed)
Called pt and spoke to spouse and informed her of the message below pertaining to her husbands labs. Juan [name], [name]

## 2014-02-10 NOTE — Assessment & Plan Note (Addendum)
Symptoms most likely related to his drinking as well as from multiple hospitalizations. Started drinking 10 oz of bourbon since discharge.  - Home health referral.   - unsteady gait and multiple hospitalizations - PT to evaluate  - discussed with SW about any other possibilities for treatment and will evaluate.   - discussed with Dr. Gwendolyn Grant  - f/u with me or Dr. Gwendolyn Grant in one week to make sure he's gone to [facility] and/or need for Behavioral health  - may need CMP at f/u.

## 2014-02-11 ENCOUNTER — Telehealth: Payer: Self-pay | Admitting: Clinical

## 2014-02-11 ENCOUNTER — Ambulatory Visit: Payer: Self-pay | Admitting: Family Medicine

## 2014-02-11 NOTE — Telephone Encounter (Signed)
CSW received a call from [place] informing CSW that pt will be seen on Monday 02/14/13.  Theresia Bough, MSW, LCSW [phone]

## 2014-02-11 NOTE — Telephone Encounter (Signed)
CSW received a call from [place] who informed CSW that unfortunately pt could not receive HH until pt has a plan of care regarding his substance abuse.  CSW spoke with wife in length today. CSW and wife explored options for inpatient treatment as pt has said he would be agreeable. Wife and CSW contacted several places, pt was accepted into [facility] in [place], [place] however pt declined. Pt stated he's too sick to go. Wife stated she is leaving the home today as she can no longer deal with pts alcoholism.  (CSW assisting wife in finding resources for housing.)  Theresia Bough, MSW, [place] [phone]

## 2014-02-12 ENCOUNTER — Other Ambulatory Visit: Payer: Self-pay

## 2014-02-12 ENCOUNTER — Encounter (HOSPITAL_COMMUNITY): Payer: Self-pay | Admitting: Emergency Medicine

## 2014-02-12 ENCOUNTER — Emergency Department (HOSPITAL_COMMUNITY): Payer: No Typology Code available for payment source

## 2014-02-12 ENCOUNTER — Inpatient Hospital Stay (HOSPITAL_COMMUNITY)
Admission: EM | Admit: 2014-02-12 | Discharge: 2014-02-15 | DRG: 897 | Disposition: A | Discharge status: Home / Self Care | Payer: No Typology Code available for payment source | Attending: Family Medicine | Admitting: Family Medicine

## 2014-02-12 DIAGNOSIS — F418 Other specified anxiety disorders: Secondary | ICD-10-CM | POA: Diagnosis present

## 2014-02-12 DIAGNOSIS — F10239 Alcohol dependence with withdrawal, unspecified: Principal | ICD-10-CM | POA: Diagnosis present

## 2014-02-12 DIAGNOSIS — R05 Cough: Secondary | ICD-10-CM | POA: Diagnosis present

## 2014-02-12 DIAGNOSIS — I10 Essential (primary) hypertension: Secondary | ICD-10-CM | POA: Diagnosis present

## 2014-02-12 DIAGNOSIS — L409 Psoriasis, unspecified: Secondary | ICD-10-CM | POA: Diagnosis present

## 2014-02-12 DIAGNOSIS — Z833 Family history of diabetes mellitus: Secondary | ICD-10-CM | POA: Diagnosis not present

## 2014-02-12 DIAGNOSIS — I5022 Chronic systolic (congestive) heart failure: Secondary | ICD-10-CM | POA: Diagnosis present

## 2014-02-12 DIAGNOSIS — F32A Depression, unspecified: Secondary | ICD-10-CM | POA: Diagnosis present

## 2014-02-12 DIAGNOSIS — F1010 Alcohol abuse, uncomplicated: Secondary | ICD-10-CM | POA: Diagnosis present

## 2014-02-12 DIAGNOSIS — G629 Polyneuropathy, unspecified: Secondary | ICD-10-CM | POA: Diagnosis present

## 2014-02-12 DIAGNOSIS — F17200 Nicotine dependence, unspecified, uncomplicated: Secondary | ICD-10-CM

## 2014-02-12 DIAGNOSIS — F419 Anxiety disorder, unspecified: Secondary | ICD-10-CM | POA: Diagnosis present

## 2014-02-12 DIAGNOSIS — F17210 Nicotine dependence, cigarettes, uncomplicated: Secondary | ICD-10-CM | POA: Diagnosis present

## 2014-02-12 DIAGNOSIS — I4510 Unspecified right bundle-branch block: Secondary | ICD-10-CM | POA: Diagnosis present

## 2014-02-12 DIAGNOSIS — I5032 Chronic diastolic (congestive) heart failure: Secondary | ICD-10-CM | POA: Diagnosis present

## 2014-02-12 DIAGNOSIS — E871 Hypo-osmolality and hyponatremia: Secondary | ICD-10-CM | POA: Diagnosis present

## 2014-02-12 DIAGNOSIS — J449 Chronic obstructive pulmonary disease, unspecified: Secondary | ICD-10-CM | POA: Diagnosis present

## 2014-02-12 DIAGNOSIS — F10939 Alcohol use, unspecified with withdrawal, unspecified: Secondary | ICD-10-CM | POA: Diagnosis present

## 2014-02-12 DIAGNOSIS — K429 Umbilical hernia without obstruction or gangrene: Secondary | ICD-10-CM | POA: Diagnosis present

## 2014-02-12 DIAGNOSIS — F329 Major depressive disorder, single episode, unspecified: Secondary | ICD-10-CM | POA: Diagnosis present

## 2014-02-12 DIAGNOSIS — Z720 Tobacco use: Secondary | ICD-10-CM | POA: Diagnosis present

## 2014-02-12 DIAGNOSIS — R059 Cough, unspecified: Secondary | ICD-10-CM

## 2014-02-12 DIAGNOSIS — R000 Tachycardia, unspecified: Secondary | ICD-10-CM

## 2014-02-12 LAB — COMPREHENSIVE METABOLIC PANEL
ALT: 25 U/L (ref 0–53)
AST: 39 U/L — AB (ref 0–37)
Albumin: 3.7 g/dL (ref 3.5–5.2)
Alkaline Phosphatase: 84 U/L (ref 39–117)
Anion gap: 11 (ref 5–15)
BILIRUBIN TOTAL: 0.7 mg/dL (ref 0.3–1.2)
BUN: 15 mg/dL (ref 6–23)
CALCIUM: 7.8 mg/dL — AB (ref 8.4–10.5)
CO2: 20 mmol/L (ref 19–32)
Chloride: 94 mEq/L — ABNORMAL LOW (ref 96–112)
Creatinine, Ser: 0.89 mg/dL (ref 0.50–1.35)
GFR calc Af Amer: >90 mL/min (ref >90)
GFR calc non Af Amer: >90 mL/min (ref >90)
Glucose, Bld: 145 mg/dL — ABNORMAL HIGH (ref 70–99)
Potassium: 4.1 mmol/L (ref 3.5–5.1)
Sodium: 125 mmol/L — ABNORMAL LOW (ref 135–145)
TOTAL PROTEIN: 6.9 g/dL (ref 6.0–8.3)

## 2014-02-12 LAB — URINALYSIS, ROUTINE W REFLEX MICROSCOPIC
Bilirubin Urine: NEGATIVE
Glucose, UA: NEGATIVE mg/dL
Hgb urine dipstick: NEGATIVE
KETONES UR: NEGATIVE mg/dL
Leukocytes, UA: NEGATIVE
Nitrite: NEGATIVE
PH: 6 (ref 5.0–8.0)
PROTEIN: NEGATIVE mg/dL
Specific Gravity, Urine: 1.019 (ref 1.005–1.030)
Urobilinogen, UA: 0.2 mg/dL (ref 0.0–1.0)

## 2014-02-12 LAB — CBC WITH DIFFERENTIAL/PLATELET
Basophils Absolute: 0 10*3/uL (ref 0.0–0.1)
Basophils Relative: 0 % (ref 0–1)
Eosinophils Absolute: 0 10*3/uL (ref 0.0–0.7)
Eosinophils Relative: 0 % (ref 0–5)
HCT: 43.5 % (ref 39.0–52.0)
Hemoglobin: 15.1 g/dL (ref 13.0–17.0)
Lymphocytes Relative: 16 % (ref 12–46)
Lymphs Abs: 1.1 10*3/uL (ref 0.7–4.0)
MCH: 32.3 pg (ref 26.0–34.0)
MCHC: 34.7 g/dL (ref 30.0–36.0)
MCV: 93.1 fL (ref 78.0–100.0)
MONO ABS: 0.7 10*3/uL (ref 0.1–1.0)
Monocytes Relative: 11 % (ref 3–12)
Neutro Abs: 5.1 10*3/uL (ref 1.7–7.7)
Neutrophils Relative %: 73 % (ref 43–77)
PLATELETS: 310 10*3/uL (ref 150–400)
RBC: 4.67 MIL/uL (ref 4.22–5.81)
RDW: 13.7 % (ref 11.5–15.5)
WBC: 7 10*3/uL (ref 4.0–10.5)

## 2014-02-12 LAB — MAGNESIUM: MAGNESIUM: 1.9 mg/dL (ref 1.5–2.5)

## 2014-02-12 LAB — I-STAT TROPONIN, ED: TROPONIN I, POC: 0.01 ng/mL (ref 0.00–0.08)

## 2014-02-12 MED ORDER — ALBUTEROL SULFATE (2.5 MG/3ML) 0.083% IN NEBU
5.0000 mg | INHALATION_SOLUTION | Freq: Once | RESPIRATORY_TRACT | Status: AC
  Administered 2014-02-12: 5 mg via RESPIRATORY_TRACT
  Filled 2014-02-12: qty 6

## 2014-02-12 MED ORDER — LORAZEPAM 2 MG/ML IJ SOLN
1.0000 mg | Freq: Once | INTRAMUSCULAR | Status: AC
  Administered 2014-02-12: 1 mg via INTRAVENOUS
  Filled 2014-02-12: qty 1

## 2014-02-12 MED ORDER — METHYLPREDNISOLONE SODIUM SUCC 125 MG IJ SOLR
125.0000 mg | Freq: Once | INTRAMUSCULAR | Status: AC
  Administered 2014-02-12: 125 mg via INTRAVENOUS
  Filled 2014-02-12: qty 2

## 2014-02-12 MED ORDER — IPRATROPIUM BROMIDE 0.02 % IN SOLN
0.5000 mg | RESPIRATORY_TRACT | Status: AC
  Administered 2014-02-12: 0.5 mg via RESPIRATORY_TRACT
  Filled 2014-02-12: qty 2.5

## 2014-02-12 MED ORDER — MAGNESIUM SULFATE 2 GM/50ML IV SOLN
2.0000 g | Freq: Once | INTRAVENOUS | Status: AC
  Administered 2014-02-12: 2 g via INTRAVENOUS
  Filled 2014-02-12: qty 50

## 2014-02-12 MED ORDER — GI COCKTAIL ~~LOC~~
30.0000 mL | Freq: Once | ORAL | Status: AC
  Administered 2014-02-12: 30 mL via ORAL
  Filled 2014-02-12: qty 30

## 2014-02-12 MED ORDER — SODIUM CHLORIDE 0.9 % IV BOLUS (SEPSIS)
1000.0000 mL | INTRAVENOUS | Status: AC
  Administered 2014-02-12: 1000 mL via INTRAVENOUS

## 2014-02-12 MED ORDER — KETOROLAC TROMETHAMINE 30 MG/ML IJ SOLN
30.0000 mg | Freq: Once | INTRAMUSCULAR | Status: AC
  Administered 2014-02-12: 30 mg via INTRAVENOUS
  Filled 2014-02-12: qty 1

## 2014-02-12 MED ORDER — SODIUM CHLORIDE 0.9 % IV BOLUS (SEPSIS)
1000.0000 mL | INTRAVENOUS | Status: AC
Start: 2014-02-12 — End: 2014-02-12
  Administered 2014-02-12: 1000 mL via INTRAVENOUS

## 2014-02-12 NOTE — ED Notes (Signed)
Bed: [facility]79 Expected date:  Expected time:  Means of arrival:  Comments: ETOH

## 2014-02-12 NOTE — ED Notes (Signed)
Provided pt with [place] sandwich and Ginger ale

## 2014-02-12 NOTE — ED Notes (Addendum)
Pt here with c/o ETOH states that he has not been able to eat in a couple of days. Denies n/v/d. States that he drinks everyday but does not know how much. Denies SI/HI

## 2014-02-12 NOTE — ED Provider Notes (Signed)
CSN: [id number]     Arrival date & time 02/12/14  1808 History   First MD Initiated Contact with Patient 02/12/14 1834     Chief Complaint  Patient presents with  . Alcohol Intoxication    (Consider location/radiation/quality/duration/timing/severity/associated sxs/prior Treatment) HPI Level V Caveat Juan [name] is a 64 yo male presenting with report of feeling "sick".  He states he drinks 6-8 oz of bourbon daily.  He states his most bothersome complaint is general weakness and body aches.  He reports 3 oz of bourbon today.  He reports some intermittent vomiting in the last week but none today.  His last bowel movement was today and it was normal. He reports frequent falls He denies productive cough, fever, chest pain or syncopal episode.    Past Medical History  Diagnosis Date  . CHF (congestive heart failure) 10/2010    ECHO:  EF 40%, Grade II diastolic dysfunction  . Alcoholism   . Allergy     seasonal   . Anxiety   . Depression   . Asthma   . Hyperlipidemia   . Hypertension   . Seizures    Past Surgical History  Procedure Laterality Date  . Removal of ingrown toenail     Family History  Problem Relation Age of Onset  . Alzheimer's disease Mother   . Diabetes Mother   . Heart disease Maternal Uncle    History  Substance Use Topics  . Smoking status: Current Every Day Smoker -- 1.50 packs/day for 40 years    Types: Cigarettes  . Smokeless tobacco: Never Used     Comment: currently smokes less than 1 ppd  . Alcohol Use: Yes     Comment: 4-5 burbon's a day for the past 15 years    Review of Systems  Unable to perform ROS: Other  Constitutional:       Intoxicated    Allergies  Review of patient's allergies indicates no known allergies.  Home Medications   Prior to Admission medications   Medication Sig Start Date End Date Taking? Authorizing Provider  acetaminophen (TYLENOL) 500 MG tablet Take 1,500 mg by mouth every 6 (six) hours as needed for headache  (headache).    Yes Historical Provider, MD  B-Complex CAPS Take 1 capsule by mouth daily.   Yes Historical Provider, MD  clonazePAM (KLONOPIN) 0.5 MG tablet Take 1 tablet (0.5 mg total) by mouth 2 (two) times daily as needed for anxiety (anxiety). 12/01/13  Yes Pincus Large, DO  DULoxetine (CYMBALTA) 30 MG capsule Take 1 capsule (30 mg total) by mouth daily. 12/14/13  Yes Tobey Grim, MD  fluticasone [facility]) 50 MCG/ACT nasal spray Use two sprays in each nostril daily 11/08/13  Yes Tobey Grim, MD  gabapentin (NEURONTIN) 300 MG capsule Take 1 capsule (300 mg total) by mouth 3 (three) times daily. 12/14/13  Yes Tobey Grim, MD  lisinopril (PRINIVIL,ZESTRIL) 20 MG tablet Take 1 tablet (20 mg total) by mouth daily. MUST KEEP APPOINTMENT 02/17/2014 WITH DR HILTY FOR FUTURE REFILLS. 02/09/14  Yes Chrystie Nose, MD  LORazepam (ATIVAN) 1 MG tablet Take 1 tablet (1 mg total) by mouth every 8 (eight) hours. 12/01/13  Yes Pincus Large, DO  aspirin EC 81 MG EC tablet Take 1 tablet (81 mg total) by mouth daily. Patient not taking: Reported on 02/12/2014 11/13/12   Renae Fickle, MD  atorvastatin (LIPITOR) 40 MG tablet Take 1 tablet by mouth daily at 6 pm. Needs office  visit. 01/13/14   Chrystie Nose, MD  carvedilol (COREG) 6.25 MG tablet Take 1 tablet (6.25 mg total) by mouth 2 (two) times daily with a meal. MUST KEEP APPOINTMENT 02/17/2014 WITH DR HILTY FOR FUTURE REFILLS. 02/09/14   Chrystie Nose, MD  dicyclomine (BENTYL) 20 MG tablet Take 1 tablet (20 mg total) by mouth 3 (three) times daily before meals. 12/01/13   Pincus Large, DO  doxycycline (VIBRA-TABS) 100 MG tablet Take 1 tablet (100 mg total) by mouth 2 (two) times daily. 01/31/14   Elenora Gamma, MD  feeding supplement, RESOURCE BREEZE, (RESOURCE BREEZE) LIQD Take 1 Container by mouth 3 (three) times daily between meals. Chocolate or whatever flavor desired by patient Patient not taking: Reported on 02/12/2014 12/08/13    Tobey Grim, MD  furosemide (LASIX) 40 MG tablet Take 1 tablet by mouth daily. Needs office visit. 01/13/14   Chrystie Nose, MD  Ipratropium-Albuterol (COMBIVENT RESPIMAT) 20-100 MCG/ACT AERS respimat Inhale 2 puffs into the lungs every 4 (four) hours as needed for wheezing (wheezing). 12/15/13   Tobey Grim, MD  montelukast (SINGULAIR) 10 MG tablet Take 1 tablet (10 mg total) by mouth at bedtime. 10/26/13   Tobey Grim, MD  Multiple Vitamin (MULTIVITAMIN WITH MINERALS) TABS tablet Take 1 tablet by mouth daily.    Historical Provider, MD  mupirocin ointment (BACTROBAN) 2 % Place 1 application into the nose 2 (two) times daily. 01/27/14   Ashly Hulen Skains, DO  potassium chloride SA (K-DUR,KLOR-CON) 20 MEQ tablet TAKE TWO TABLETS BY MOUTH DAILY  01/31/14   Chrystie Nose, MD  predniSONE (DELTASONE) 50 MG tablet Take 1 tablet (50 mg total) by mouth daily with breakfast. 01/31/14   Elenora Gamma, MD  spironolactone (ALDACTONE) 25 MG tablet TAKE HALF TABLET BY MOUTH DAILY. needs appointment for further refills. 01/13/14   Chrystie Nose, MD  tiotropium (SPIRIVA) 18 MCG inhalation capsule Place 1 capsule (18 mcg total) into inhaler and inhale daily. 01/04/14   Tobey Grim, MD  triamcinolone (KENALOG) 0.025 % ointment Apply 1 application topically 2 (two) times daily. Patient taking differently: Apply 1 application topically 2 (two) times daily as needed (for rash).  10/26/13   Tobey Grim, MD  Vitamin D, Ergocalciferol, (DRISDOL) 50000 UNITS CAPS capsule Take 1 capsule (50,000 Units total) by mouth every 7 (seven) days. 01/28/14   Ashly M Gottschalk, DO   BP 147/77 mmHg  Pulse 127  Temp(Src) 97.9 F (36.6 C) (Oral)  Resp 14  SpO2 97% Physical Exam  Constitutional: He appears well-developed and well-nourished. No distress.  HENT:  Head: Normocephalic and atraumatic.  Mouth/Throat: Oropharynx is clear and moist. Mucous membranes are dry. No oropharyngeal exudate.   Eyes: Conjunctivae are normal.  Neck: Neck supple. No thyromegaly present.  Cardiovascular: Regular rhythm and intact distal pulses.  Tachycardia present.   Pulmonary/Chest: Effort normal. No respiratory distress. He has no decreased breath sounds. He has wheezes in the right upper field, the right middle field, the right lower field, the left upper field, the left middle field and the left lower field. He has no rhonchi. He has no rales. He exhibits no tenderness.  Abdominal: Soft. There is no tenderness.  Musculoskeletal: He exhibits no tenderness.  Lymphadenopathy:    He has no cervical adenopathy.  Neurological: GCS eye subscore is 3. GCS verbal subscore is 4. GCS motor subscore is 6.  Will open his eyes quickly to verbal stimuli, but speech is  slurred and there is a strong smell of ETOH  Skin: Skin is warm and dry. No rash noted. He is not diaphoretic.  Psychiatric: He has a normal mood and affect.  Nursing note and vitals reviewed.   ED Course  Procedures (including critical care time) Labs Review Labs Reviewed  COMPREHENSIVE METABOLIC PANEL - Abnormal; Notable for the following:    Sodium 125 (*)    Chloride 94 (*)    Glucose, Bld 145 (*)    Calcium 7.8 (*)    AST 39 (*)    All other components within normal limits  CBC - Abnormal; Notable for the following:    RBC 4.05 (*)    Hemoglobin 12.7 (*)    HCT 36.5 (*)    All other components within normal limits  URINALYSIS, ROUTINE W REFLEX MICROSCOPIC  MAGNESIUM  I-STAT TROPOININ, ED    Imaging Review Dg Chest 2 View  02/12/2014   CLINICAL DATA:  Initial evaluation for cough, history of congestive heart failure asthma and hypertension  EXAM: CHEST  2 VIEW  COMPARISON:  01/24/2014  FINDINGS: Hyperinflation consistent with COPD. Heart size upper normal. Vascular pattern normal. Mild bronchitic change. Mild scarring or atelectasis left lung base.  IMPRESSION: No evidence of pneumonia or other acute finding. Evidence of COPD  with chronic bronchitic change.   Electronically Signed   By: Esperanza Heir M.D.   On: 02/12/2014 20:28     EKG Interpretation   Date/Time:  Saturday February 12 2014 19:17:32 EST Ventricular Rate:  128 PR Interval:  123 QRS Duration: 132 QT Interval:  421 QTC Calculation: 614 R Axis:   -101 Text Interpretation:  Sinus tachycardia Right bundle branch block Inferior  infarct, old Baseline wander in lead(s) V6 Confirmed by KOHUT  MD, STEPHEN  ([id number]) on 02/12/2014 7:53:47 PM      MDM   Final diagnoses:  Cough  Tachycardia  Hyponatremia   64 yo male reports feeling generally weak with inability to eat, currently intoxicated on exam.  He reports chronic alcohol intake. He is tachycardic and wheezing on exam. Discussed case with Dr. Juleen China.  CBC, CMP, Mag, Troponin, UA, EKG, CXR, NS bolus, Duoneb and solu-medrol. Consider dehydration, COPD exacerbation,pneumonia, alcohol withdrawal.  Labs reviewed: Na: 125, AST: 39, Mag: 1.9.  Will replete Mag with 2 gm IV. EKG: sinus tachycardia, no ischemia, normal troponin  CXR resulted: COPD but no pneumonia  Pt more alert but remains tachycardic.  2nd liter NS, ativan, pt requesting medicine for his stomach, GI cocktail given.   Consulted Dr. Caleb Popp [facility]) regarding need for admission due to continued tachycardia and hyponatremia. Pt will be transferred and admitted at [facility] for further evaluation. EMTALA signed and pt and wife updated with plan. The patient appears reasonably stabilized for admission considering the current resources, flow, and capabilities available in the ED at this time, and I doubt any further screening and/or treatment in the ED prior to transfer and admission.     Filed Vitals:   02/12/14 1810 02/12/14 1850 02/12/14 1918  BP: 135/82 147/77 149/75  Pulse: 129 127 129  Temp: 97.9 F (36.6 C)    TempSrc: Oral    Resp: 12 14   SpO2: 99% 97%    Meds given in ED:  Medications  sodium chloride 0.9  % bolus 1,000 mL (0 mLs Intravenous Stopped 02/12/14 2128)  LORazepam (ATIVAN) injection 1 mg (1 mg Intravenous Given 02/12/14 2002)  sodium chloride 0.9 % bolus 1,000 mL (  0 mLs Intravenous Stopped 02/12/14 2249)  ketorolac (TORADOL) 30 MG/ML injection 30 mg (30 mg Intravenous Given 02/12/14 2021)  gi cocktail (Maalox,Lidocaine,Donnatal) (30 mLs Oral Given 02/12/14 2020)  albuterol (PROVENTIL) (2.5 MG/3ML) 0.083% nebulizer solution 5 mg (5 mg Nebulization Given 02/12/14 2023)  ipratropium (ATROVENT) nebulizer solution 0.5 mg (0.5 mg Nebulization Given 02/12/14 2023)  methylPREDNISolone sodium succinate (SOLU-MEDROL) 125 mg/2 mL injection 125 mg (125 mg Intravenous Given 02/12/14 2047)  magnesium sulfate IVPB 2 g 50 mL (0 g Intravenous Stopped 02/12/14 2249)  LORazepam (ATIVAN) injection 1 mg (1 mg Intravenous Given 02/12/14 2122)    New Prescriptions   No medications on file       Harle Battiest, NP 02/13/14 1145  Raeford Razor, MD 02/14/14 1128

## 2014-02-13 DIAGNOSIS — E871 Hypo-osmolality and hyponatremia: Secondary | ICD-10-CM

## 2014-02-13 DIAGNOSIS — F10230 Alcohol dependence with withdrawal, uncomplicated: Secondary | ICD-10-CM

## 2014-02-13 DIAGNOSIS — I10 Essential (primary) hypertension: Secondary | ICD-10-CM

## 2014-02-13 DIAGNOSIS — R000 Tachycardia, unspecified: Secondary | ICD-10-CM

## 2014-02-13 DIAGNOSIS — I5032 Chronic diastolic (congestive) heart failure: Secondary | ICD-10-CM

## 2014-02-13 LAB — BASIC METABOLIC PANEL
Anion gap: 14 (ref 5–15)
BUN: 16 mg/dL (ref 6–23)
CALCIUM: 7.7 mg/dL — AB (ref 8.4–10.5)
CHLORIDE: 95 meq/L — AB (ref 96–112)
CO2: 18 mmol/L — ABNORMAL LOW (ref 19–32)
CREATININE: 1.08 mg/dL (ref 0.50–1.35)
GFR calc non Af Amer: 72 mL/min — ABNORMAL LOW (ref >90)
GFR, EST AFRICAN AMERICAN: 84 mL/min — AB (ref >90)
GLUCOSE: 179 mg/dL — AB (ref 70–99)
Potassium: 4.6 mmol/L (ref 3.5–5.1)
SODIUM: 127 mmol/L — AB (ref 135–145)

## 2014-02-13 LAB — CBC
HEMATOCRIT: 36.5 % — AB (ref 39.0–52.0)
Hemoglobin: 12.7 g/dL — ABNORMAL LOW (ref 13.0–17.0)
MCH: 31.4 pg (ref 26.0–34.0)
MCHC: 34.8 g/dL (ref 30.0–36.0)
MCV: 90.1 fL (ref 78.0–100.0)
Platelets: 257 10*3/uL (ref 150–400)
RBC: 4.05 MIL/uL — ABNORMAL LOW (ref 4.22–5.81)
RDW: 13.3 % (ref 11.5–15.5)
WBC: 4.3 10*3/uL (ref 4.0–10.5)

## 2014-02-13 LAB — MRSA PCR SCREENING: MRSA by PCR: POSITIVE — AB

## 2014-02-13 MED ORDER — IPRATROPIUM-ALBUTEROL 0.5-2.5 (3) MG/3ML IN SOLN
3.0000 mL | RESPIRATORY_TRACT | Status: DC | PRN
  Administered 2014-02-13 (×2): 3 mL via RESPIRATORY_TRACT
  Filled 2014-02-13 (×2): qty 3

## 2014-02-13 MED ORDER — MONTELUKAST SODIUM 10 MG PO TABS
10.0000 mg | ORAL_TABLET | Freq: Every day | ORAL | Status: DC
  Administered 2014-02-13 – 2014-02-14 (×2): 10 mg via ORAL
  Filled 2014-02-13 (×3): qty 1

## 2014-02-13 MED ORDER — FOLIC ACID 1 MG PO TABS
1.0000 mg | ORAL_TABLET | Freq: Every day | ORAL | Status: DC
  Administered 2014-02-13 – 2014-02-15 (×3): 1 mg via ORAL
  Filled 2014-02-13 (×3): qty 1

## 2014-02-13 MED ORDER — ASPIRIN EC 81 MG PO TBEC
81.0000 mg | DELAYED_RELEASE_TABLET | Freq: Every day | ORAL | Status: DC
  Administered 2014-02-13 – 2014-02-15 (×3): 81 mg via ORAL
  Filled 2014-02-13 (×3): qty 1

## 2014-02-13 MED ORDER — SODIUM CHLORIDE 0.9 % IJ SOLN
3.0000 mL | Freq: Two times a day (BID) | INTRAMUSCULAR | Status: DC
  Administered 2014-02-13 – 2014-02-15 (×4): 3 mL via INTRAVENOUS

## 2014-02-13 MED ORDER — FLUTICASONE PROPIONATE 50 MCG/ACT NA SUSP
2.0000 | Freq: Every day | NASAL | Status: DC
  Administered 2014-02-13 – 2014-02-15 (×3): 2 via NASAL
  Filled 2014-02-13: qty 16

## 2014-02-13 MED ORDER — ADULT MULTIVITAMIN W/MINERALS CH
1.0000 | ORAL_TABLET | Freq: Every day | ORAL | Status: DC
  Administered 2014-02-13 – 2014-02-15 (×3): 1 via ORAL
  Filled 2014-02-13 (×3): qty 1

## 2014-02-13 MED ORDER — DULOXETINE HCL 30 MG PO CPEP
30.0000 mg | ORAL_CAPSULE | Freq: Every day | ORAL | Status: DC
  Administered 2014-02-13 – 2014-02-15 (×3): 30 mg via ORAL
  Filled 2014-02-13 (×3): qty 1

## 2014-02-13 MED ORDER — CARVEDILOL 6.25 MG PO TABS
6.2500 mg | ORAL_TABLET | Freq: Two times a day (BID) | ORAL | Status: DC
  Administered 2014-02-13 – 2014-02-15 (×6): 6.25 mg via ORAL
  Filled 2014-02-13 (×8): qty 1

## 2014-02-13 MED ORDER — LISINOPRIL 10 MG PO TABS
10.0000 mg | ORAL_TABLET | Freq: Every day | ORAL | Status: DC
  Administered 2014-02-13 – 2014-02-15 (×3): 10 mg via ORAL
  Filled 2014-02-13 (×3): qty 1

## 2014-02-13 MED ORDER — ACETAMINOPHEN 500 MG PO TABS
1000.0000 mg | ORAL_TABLET | Freq: Four times a day (QID) | ORAL | Status: DC | PRN
  Administered 2014-02-13: 1000 mg via ORAL
  Filled 2014-02-13: qty 2

## 2014-02-13 MED ORDER — LORAZEPAM 2 MG/ML IJ SOLN
2.0000 mg | INTRAMUSCULAR | Status: DC | PRN
  Administered 2014-02-13 – 2014-02-14 (×5): 2 mg via INTRAVENOUS
  Filled 2014-02-13 (×5): qty 1

## 2014-02-13 MED ORDER — ENOXAPARIN SODIUM 40 MG/0.4ML ~~LOC~~ SOLN
40.0000 mg | SUBCUTANEOUS | Status: DC
  Administered 2014-02-13 – 2014-02-15 (×3): 40 mg via SUBCUTANEOUS
  Filled 2014-02-13 (×3): qty 0.4

## 2014-02-13 MED ORDER — TIOTROPIUM BROMIDE MONOHYDRATE 18 MCG IN CAPS
18.0000 ug | ORAL_CAPSULE | Freq: Every day | RESPIRATORY_TRACT | Status: DC
  Administered 2014-02-13 – 2014-02-15 (×3): 18 ug via RESPIRATORY_TRACT
  Filled 2014-02-13: qty 5

## 2014-02-13 MED ORDER — PANTOPRAZOLE SODIUM 20 MG PO TBEC
20.0000 mg | DELAYED_RELEASE_TABLET | Freq: Every day | ORAL | Status: DC
  Administered 2014-02-13 – 2014-02-15 (×3): 20 mg via ORAL
  Filled 2014-02-13 (×3): qty 1

## 2014-02-13 MED ORDER — GABAPENTIN 300 MG PO CAPS
300.0000 mg | ORAL_CAPSULE | Freq: Three times a day (TID) | ORAL | Status: DC | PRN
  Filled 2014-02-13: qty 1

## 2014-02-13 MED ORDER — VITAMIN B-1 100 MG PO TABS
100.0000 mg | ORAL_TABLET | Freq: Every day | ORAL | Status: DC
  Administered 2014-02-13 – 2014-02-15 (×3): 100 mg via ORAL
  Filled 2014-02-13 (×3): qty 1

## 2014-02-13 MED ORDER — TRIAMCINOLONE ACETONIDE 0.1 % EX OINT
1.0000 "application " | TOPICAL_OINTMENT | Freq: Two times a day (BID) | CUTANEOUS | Status: DC | PRN
  Filled 2014-02-13: qty 15

## 2014-02-13 MED ORDER — GI COCKTAIL ~~LOC~~
30.0000 mL | Freq: Once | ORAL | Status: AC
  Administered 2014-02-13: 30 mL via ORAL
  Filled 2014-02-13: qty 30

## 2014-02-13 MED ORDER — SODIUM CHLORIDE 0.9 % IV SOLN
INTRAVENOUS | Status: DC
  Administered 2014-02-13 (×2): via INTRAVENOUS

## 2014-02-13 MED ORDER — BOOST / RESOURCE BREEZE PO LIQD
1.0000 | Freq: Three times a day (TID) | ORAL | Status: DC
  Administered 2014-02-14 – 2014-02-15 (×5): 1 via ORAL

## 2014-02-13 MED ORDER — POTASSIUM CHLORIDE CRYS ER 20 MEQ PO TBCR
40.0000 meq | EXTENDED_RELEASE_TABLET | Freq: Every day | ORAL | Status: DC
  Administered 2014-02-13 – 2014-02-15 (×3): 40 meq via ORAL
  Filled 2014-02-13 (×3): qty 2

## 2014-02-13 MED ORDER — IPRATROPIUM-ALBUTEROL 20-100 MCG/ACT IN AERS: 2.0000 | INHALATION_SPRAY | RESPIRATORY_TRACT | Status: DC | PRN

## 2014-02-13 MED ORDER — ATORVASTATIN CALCIUM 40 MG PO TABS
40.0000 mg | ORAL_TABLET | Freq: Every day | ORAL | Status: DC
  Administered 2014-02-13 – 2014-02-15 (×3): 40 mg via ORAL
  Filled 2014-02-13 (×3): qty 1

## 2014-02-13 MED ORDER — SPIRONOLACTONE 12.5 MG HALF TABLET
12.5000 mg | ORAL_TABLET | Freq: Every day | ORAL | Status: DC
  Administered 2014-02-13 – 2014-02-15 (×3): 12.5 mg via ORAL
  Filled 2014-02-13 (×3): qty 1

## 2014-02-13 MED ORDER — PANTOPRAZOLE SODIUM 40 MG IV SOLR
40.0000 mg | INTRAVENOUS | Status: AC
  Administered 2014-02-13: 40 mg via INTRAVENOUS
  Filled 2014-02-13: qty 40

## 2014-02-13 MED ORDER — DICYCLOMINE HCL 20 MG PO TABS
20.0000 mg | ORAL_TABLET | Freq: Three times a day (TID) | ORAL | Status: DC
  Administered 2014-02-13 – 2014-02-15 (×9): 20 mg via ORAL
  Filled 2014-02-13 (×10): qty 1

## 2014-02-13 MED ORDER — FUROSEMIDE 40 MG PO TABS
40.0000 mg | ORAL_TABLET | Freq: Every day | ORAL | Status: DC
  Administered 2014-02-13 – 2014-02-15 (×3): 40 mg via ORAL
  Filled 2014-02-13 (×3): qty 1

## 2014-02-13 NOTE — ED Notes (Signed)
carelink at bedside 

## 2014-02-13 NOTE — ED Notes (Signed)
Report given to Tim RN

## 2014-02-13 NOTE — H&P (Signed)
Family Medicine Teaching [facility] Admission History and Physical Service Pager: [phone]  Patient name: Juan [name] Medical record number: [id number] Date of birth: 1950-11-28 Age: 64 y.o. Gender: male  Primary Care Provider: Renold Don, MD Consultants: None Code Status: Full per discussion on admission  Chief Complaint: Feeling sick and generally unwell  Assessment and Plan: Juan [name] is a 64 y.o. male presenting with feeling generally unwell, found to be tachycardic, hyponatremic, and in alcohol intoxication. PMH is significant for hyponatremia, alcohol abuse, HTN, diastolic CHF, COPD, peripheral neuropathy, RBBB, umbilical hernia  Tachycardia: Could be related to dehydration and/or alcohol withdrawal/anxiety. Does not meet SIRS criteria, no signs of infection.  No signs of PE on exam.  - Admit to SDU under FPTS, attending Chambliss - Monitor on telemetry - Repeat EKG this AM - CIWA protocol as below - IV hydration - s/p 2 - 1L boluses in ED - Will need to initiate ACS protocol if patient develops CP - iStat troponin neg, initial EKG nonischemic  Hyponatremia: Na 125 on admission, seems to be chronic issue with Na recently 130 > 127. Likely related to beer potomania in setting of chronic alcoholism  - repeat BMET this AM - Nutrition consult - NS @ 19mL/hr though will need to monitor for fluid overload and plan to d/c after 12-24 hours. - Continue home resource breeze and heart healthy diet.  Alcohol abuse: Reports last drink yesterday. Tremulous on exam and reporting anxiety. Stomach mildly distended, stable from previously, ?hepatomegaly. - SDU CIWA protocol and monitor vitals. - SW c/s for substance abuse and to help find treatment program he will go to. - daily PPI for GI ppx - Consider abd [place] for abd distention, though recent CT without hepatomegaly or ascites  COPD: Doesn't appear to be having an exacerbation. Lung exam and CXR clear. Reported dyspnea  not evident on exam and pt reported it occurred after an anxiety-inducing ambulance ride. - Hx of aspiration pneumonia and with recent episodes of emesis. Would need to cover for aspiration if initiating ABX.  -Continue home Combivent, Spiriva, singulair, flonase  Diastolic CHF: echo 09/2013 EF 14-78%, grade 1 diastolic dysfunction. Does not appear fluid overloaded on exam, doubt exacerbation. Giving 100cc/hr - will need to d/c this soon to avoid exacerbating CHF. -Continue home lasix, spironolactone (half dose), K, aspirin -strict I's and O's  HTN: BP mildly hypertensive while in the ED O/N. Has not taken meds today. -Continue home Spiro and Lisinopril (at half dose while on Ativan), Lasix, Coreg  Peripheral Neuropathy:Stable, no complaints currently -Continue home Gabapentin  Anxiety/depression: Stable, likely anxious in setting of alcohol withdrawal -Continue Cymbalta - Holding home Klonopin, Ativan - on CIWA protocol  Psoriasis: better control than previously, mild excoriations of legs -Continue home triamcinolone cr  Lung Nodule seen on imaging: noticed on CT scan 06/14/13 and hasn't had a follow up CT - consider CT chest w/out contrast (most likely as OP)  Tobacco abuse: 60 pack year hx. Contemplative about quitting.  - smoking cessation counseling  Umbilical hernia: not incarcerated. Previously was encouraged to be cleared by Cardiology in order to have surgery.  - outpatient f/u with cardiology and surgery   FEN/GI: NS @ 19mL/hr, heart healthy diet Prophylaxis: Lovenox  Disposition: Admit to SDU under FPTS, attending Chambliss. May be able to transfer out of SDU soon (no other tele beds were available upon transfer from [facility]). Dispo pending tachycardia w/u and stabilization of withdrawal  History of Present Illness: Juan [name]  is a 64 y.o. male presenting with feeling unable to eat for about 4 days other than chicken broth and problem with aloholism, both  causing him to 'run out of energy.' He also generally felt bad. He was having small amount of emesis and complete loss of appetite. He was taken to [facility] 1/6 for the same issue and it has not improved since. Na that time was 127, thought due to beer potomania, and symptoms also thought due to drinking and deconditioning from multiple hospitalizations. Had set up home health at that time and planned to f/u with PCP closely. Last alcoholic beverage was reported as being 8th, though earlier reported to be 9th before coming to hospital which he agrees is probably true. He denies abdominal pain or diarrhea (since 2 weeks ago). Reports normal intake before the last 2 weeks. Denies h/o alcohol withdrawal symptoms other than seizures which he denies being alcohol related. Thought it was more related to syncope 2 years ago due to CHF with EF 35%. Denies chest pain. Has frequent dyspnea worse from baseline since ambulance ride from [facility] causing motion sickness. Denies palpitations. Afebrile. Thinks he has lost about 10 lbs over the past few weeks since admission prior to xmas when in hospital with diarrhea. Denies new medications. Unable to sleep well over past few weeks. Was not able to go to [facility] as his wife decided it was not the place to go.   Drinks daily, typically 8 oz bourbon. 60 pack-year hx (1.5 PPD) and is contemplational to quit smoking.  In the ED: GI cocktail x2, protonix, solu-medrol 125mg , 1L NS bolus x2, Magnesium, Ativan 1mg  x2, toradol, atrovent, albuterol  Review Of Systems: Per HPI with the following additions: none, as above Otherwise 12 point review of systems was performed and was unremarkable.  Patient Active Problem List   Diagnosis Date Noted  . Tachycardia 02/12/2014  . COPD exacerbation   . Hypokalemia 01/26/2014  . Alcohol withdrawal 01/25/2014  . SOB (shortness of breath)   . Chronic diastolic heart failure   . Intractable nausea and vomiting 01/23/2014  .  Peripheral neuropathy 12/15/2013  . Diarrhea 11/28/2013  . ETOH abuse 11/18/2013  . Bronchiectasis without acute exacerbation 06/30/2013  . Lung nodule seen on imaging study 06/15/2013  . Chronic systolic heart failure 06/13/2013  . Transaminitis 06/13/2013  . RBBB 11/09/2012  . Hyponatremia 11/08/2012  . Seizure due to alcohol withdrawal 11/08/2012  . Umbilical hernia 08/06/2012  . Smoker unmotivated to quit 07/22/2012  . Tremor 07/30/2011  . HTN (hypertension) 07/30/2011  . Hepatomegaly 11/05/2010  . Depression 11/05/2010  . Anxiety 11/05/2010   Past Medical History: Past Medical History  Diagnosis Date  . CHF (congestive heart failure) 10/2010    ECHO:  EF 40%, Grade II diastolic dysfunction  . Alcoholism   . Allergy     seasonal   . Anxiety   . Depression   . Asthma   . Hyperlipidemia   . Hypertension   . Seizures    Past Surgical History: Past Surgical History  Procedure Laterality Date  . Removal of ingrown toenail     Social History: History  Substance Use Topics  . Smoking status: Current Every Day Smoker -- 1.50 packs/day for 40 years    Types: Cigarettes  . Smokeless tobacco: Never Used     Comment: currently smokes less than 1 ppd  . Alcohol Use: Yes     Comment: 4-5 burbon's a day for the past 15  years   Additional social history: drinks ~8oz bourbon daily, smokes 1.5PPD x40years, no drugs  Please also refer to relevant sections of EMR.  Family History: Family History  Problem Relation Age of Onset  . Alzheimer's disease Mother   . Diabetes Mother   . Heart disease Maternal Uncle    Allergies and Medications: No Known Allergies No current facility-administered medications on file prior to encounter.   Current Outpatient Prescriptions on File Prior to Encounter  Medication Sig Dispense Refill  . acetaminophen (TYLENOL) 500 MG tablet Take 1,500 mg by mouth every 6 (six) hours as needed for headache (headache).     . DULoxetine (CYMBALTA) 30 MG  capsule Take 1 capsule (30 mg total) by mouth daily. 30 capsule 3  . fluticasone (FLONASE) 50 MCG/ACT nasal spray Use two sprays in each nostril daily 16 g 11  . gabapentin (NEURONTIN) 300 MG capsule Take 1 capsule (300 mg total) by mouth 3 (three) times daily. 90 capsule 0  . aspirin EC 81 MG EC tablet Take 1 tablet (81 mg total) by mouth daily. 90 tablet 0  . atorvastatin (LIPITOR) 40 MG tablet Take 1 tablet by mouth daily at 6 pm. Needs office visit. 30 tablet 0  . B-Complex CAPS Take 1 capsule by mouth daily.    . carvedilol (COREG) 6.25 MG tablet Take 1 tablet (6.25 mg total) by mouth 2 (two) times daily with a meal. MUST KEEP APPOINTMENT 02/17/2014 WITH DR HILTY FOR FUTURE REFILLS. 30 tablet 0  . clonazePAM (KLONOPIN) 0.5 MG tablet Take 1 tablet (0.5 mg total) by mouth 2 (two) times daily as needed for anxiety (anxiety). 30 tablet 0  . dicyclomine (BENTYL) 20 MG tablet Take 1 tablet (20 mg total) by mouth 3 (three) times daily before meals. 90 tablet 0  . doxycycline (VIBRA-TABS) 100 MG tablet Take 1 tablet (100 mg total) by mouth 2 (two) times daily. 14 tablet 0  . feeding supplement, RESOURCE BREEZE, (RESOURCE BREEZE) LIQD Take 1 Container by mouth 3 (three) times daily between meals. Chocolate or whatever flavor desired by patient (Patient not taking: Reported on 02/12/2014) 237 mL 0  . furosemide (LASIX) 40 MG tablet Take 1 tablet by mouth daily. Needs office visit. 30 tablet 0  . Ipratropium-Albuterol (COMBIVENT RESPIMAT) 20-100 MCG/ACT AERS respimat Inhale 2 puffs into the lungs every 4 (four) hours as needed for wheezing (wheezing). 4 g 3  . lisinopril (PRINIVIL,ZESTRIL) 20 MG tablet Take 1 tablet (20 mg total) by mouth daily. MUST KEEP APPOINTMENT 02/17/2014 WITH DR HILTY FOR FUTURE REFILLS. 15 tablet 0  . LORazepam (ATIVAN) 1 MG tablet Take 1 tablet (1 mg total) by mouth every 8 (eight) hours. (Patient not taking: Reported on 02/12/2014) 24 tablet 0  . montelukast (SINGULAIR) 10 MG tablet  Take 1 tablet (10 mg total) by mouth at bedtime. 30 tablet 3  . Multiple Vitamin (MULTIVITAMIN WITH MINERALS) TABS tablet Take 1 tablet by mouth daily.    . mupirocin ointment (BACTROBAN) 2 % Place 1 application into the nose 2 (two) times daily. (Patient not taking: Reported on 02/12/2014) 22 g 0  . potassium chloride SA (K-DUR,KLOR-CON) 20 MEQ tablet TAKE TWO TABLETS BY MOUTH DAILY  60 tablet 0  . predniSONE (DELTASONE) 50 MG tablet Take 1 tablet (50 mg total) by mouth daily with breakfast. (Patient not taking: Reported on 02/12/2014) 5 tablet 0  . spironolactone (ALDACTONE) 25 MG tablet TAKE HALF TABLET BY MOUTH DAILY. needs appointment for further refills. 15 tablet  0  . tiotropium (SPIRIVA) 18 MCG inhalation capsule Place 1 capsule (18 mcg total) into inhaler and inhale daily. 30 capsule 6  . triamcinolone (KENALOG) 0.025 % ointment Apply 1 application topically 2 (two) times daily. (Patient taking differently: Apply 1 application topically 2 (two) times daily as needed (for rash). ) 30 g 0  . Vitamin D, Ergocalciferol, (DRISDOL) 50000 UNITS CAPS capsule Take 1 capsule (50,000 Units total) by mouth every 7 (seven) days. 6 capsule 0  . [DISCONTINUED] albuterol (PROVENTIL,VENTOLIN) 90 MCG/ACT inhaler Inhale 2 puffs into the lungs every 6 (six) hours as needed for wheezing. 17 g 0    Objective: BP 142/80 mmHg  Pulse 136  Temp(Src) 97.9 F (36.6 C) (Oral)  Resp 25  SpO2 97% Exam: General: discheveled appearing, laying in bed, NAD, tremulous HEENT: NCAT, EOMI, slightly dry MM, OP stained dark (as if with bile from recent emesis), poor dentition, sclera clear Cardiovascular: Tachycardia, regular rate, no m/r/g. 2+ DP/radial pulses b/l Respiratory: CTAB, no w/r/c. Tachypnea, no subcostal retractions Abdomen: Soft, obese, NDNT, large umbilical hernia, some noted fullness in RUQ ?hepatomegaly, difficult exam due to habitus, similar to previous exams Extremities/Skin: WWP, some excoriations, well  controlled psoriasis plaques seen on legs compared to previously. No edema Neuro: A&O, CN 2-12 grossly intact. Tremulous, speech normal.  Labs and Imaging: CBC BMET   Recent Labs Lab 02/12/14 1849  WBC 7.0  HGB 15.1  HCT 43.5  PLT 310    Recent Labs Lab 02/12/14 1849  NA 125*  K 4.1  CL 94*  CO2 20  BUN 15  CREATININE 0.89  GLUCOSE 145*  CALCIUM 7.8*     Urinalysis    Component Value Date/Time   COLORURINE YELLOW 02/12/2014 2004   APPEARANCEUR CLEAR 02/12/2014 2004   LABSPEC 1.019 02/12/2014 2004   PHURINE 6.0 02/12/2014 2004   GLUCOSEU NEGATIVE 02/12/2014 2004   HGBUR NEGATIVE 02/12/2014 2004   BILIRUBINUR NEGATIVE 02/12/2014 2004   KETONESUR NEGATIVE 02/12/2014 2004   PROTEINUR NEGATIVE 02/12/2014 2004   UROBILINOGEN 0.2 02/12/2014 2004   NITRITE NEGATIVE 02/12/2014 2004   LEUKOCYTESUR NEGATIVE 02/12/2014 2004     iStat troponin neg  CXR (1/9): No evidence of pneumonia or other acute finding. Evidence of COPD with chronic bronchitic change.  EKG (1/9): Sinus tachycardia, RBBB (present on previous EKGs), no ST elevations  Shirlee Latch, MD 02/13/2014, 12:23 AM PGY-1,  Family Medicine FPTS Intern pager: [phone], text pages welcome  I have seen and examined the patient with Dr Beryle Flock and agree with her assessment and plan with my additions in blue.  Leona Singleton, MD PGY-3, [facility] 02/13/2014 8:42 AM

## 2014-02-13 NOTE — ED Notes (Signed)
Per flow manager at [facility], they will not be accepting any SD patients until atleast 5am.

## 2014-02-13 NOTE — ED Notes (Signed)
[facility] called  

## 2014-02-13 NOTE — Social Work (Signed)
Clinical Social Work Department BRIEF PSYCHOSOCIAL ASSESSMENT 02/13/2014  Patient:  Juan [name], Juan [name]     Account Number:  [id number]     Admit date:  02/12/2014  Clinical Social Worker:  Eddie Candle  Date/Time:  02/13/2014 05:48 PM  Referred by:  Physician  Date Referred:  02/13/2014 Referred for  Substance Abuse   Other Referral:   Interview type:  Patient Other interview type:   Wife was also present during the assessment.    PSYCHOSOCIAL DATA Living Status:  WIFE Admitted from facility:   Level of care:   Primary support name:  Filbert Eckart Primary support relationship to patient:  SPOUSE Degree of support available:   Wife is ery supportive and has been facilitating patient's medical care and has been involved in seeking placement.    CURRENT CONCERNS Current Concerns  Substance Abuse   Other Concerns:    SOCIAL WORK ASSESSMENT / PLAN CSW assessed patient and encouraged patient and wife to explore their treatment options.  CSW will share information with weekeay CSW in order to identify if there are additional resources to help support patient getting connected into treatment. CSW encouraged them to explore SAIOP and inpatient substance abuse treatment.   Assessment/plan status:  Psychosocial Support/Ongoing Assessment of Needs Other assessment/ plan:   Information/referral to community resources:    PATIENT'S/FAMILY'S RESPONSE TO PLAN OF CARE: Patient desires outpatient treatment., wife desires for patient to go inpatient in order to treat his alcohol addiciton. CSW recommended that they also consider [facility]. CSW also identified that weekday CSW will follow up with additional resources.   Beverly Sessions MSW, LCSW [phone]

## 2014-02-13 NOTE — ED Notes (Signed)
Last drink at 2pm on 02/12/13

## 2014-02-13 NOTE — ED Notes (Signed)
Attempted too call report to [facility]

## 2014-02-14 LAB — BASIC METABOLIC PANEL
Anion gap: 5 (ref 5–15)
BUN: 15 mg/dL (ref 6–23)
CHLORIDE: 99 meq/L (ref 96–112)
CO2: 22 mmol/L (ref 19–32)
Calcium: 7.6 mg/dL — ABNORMAL LOW (ref 8.4–10.5)
Creatinine, Ser: 0.93 mg/dL (ref 0.50–1.35)
GFR calc non Af Amer: 89 mL/min — ABNORMAL LOW (ref >90)
GLUCOSE: 107 mg/dL — AB (ref 70–99)
Potassium: 4.2 mmol/L (ref 3.5–5.1)
SODIUM: 126 mmol/L — AB (ref 135–145)

## 2014-02-14 LAB — CBC
HCT: 31.6 % — ABNORMAL LOW (ref 39.0–52.0)
Hemoglobin: 10.7 g/dL — ABNORMAL LOW (ref 13.0–17.0)
MCH: 31.1 pg (ref 26.0–34.0)
MCHC: 33.9 g/dL (ref 30.0–36.0)
MCV: 91.9 fL (ref 78.0–100.0)
Platelets: 212 10*3/uL (ref 150–400)
RBC: 3.44 MIL/uL — ABNORMAL LOW (ref 4.22–5.81)
RDW: 13.5 % (ref 11.5–15.5)
WBC: 7.4 10*3/uL (ref 4.0–10.5)

## 2014-02-14 MED ORDER — CHLORHEXIDINE GLUCONATE CLOTH 2 % EX PADS
6.0000 | MEDICATED_PAD | Freq: Every day | CUTANEOUS | Status: DC
Start: 2014-02-14 — End: 2014-02-15
  Administered 2014-02-14 – 2014-02-15 (×2): 6 via TOPICAL

## 2014-02-14 MED ORDER — MUPIROCIN 2 % EX OINT
1.0000 | TOPICAL_OINTMENT | Freq: Two times a day (BID) | CUTANEOUS | Status: DC
Start: 2014-02-14 — End: 2014-02-15
  Administered 2014-02-14 – 2014-02-15 (×3): 1 via NASAL
  Filled 2014-02-14 (×2): qty 22

## 2014-02-14 MED ORDER — WHITE PETROLATUM GEL
Status: AC
  Administered 2014-02-14: 0.2
  Filled 2014-02-14: qty 1

## 2014-02-14 NOTE — Progress Notes (Signed)
Utilization review completed. Anette Guarneri, RN, BSN.1

## 2014-02-14 NOTE — Progress Notes (Signed)
Family Medicine Teaching Service Daily Progress Note Intern Pager: [phone]  Patient name: Juan [name] Medical record number: [id number] Date of birth: 12-17-1950 Age: 64 y.o. Gender: male  Primary Care Provider: Renold Don, MD Consultants: None Code Status: Full  Pt Overview and Major Events to Date:  1/10: Admitted for withdrawal symtpoms and tachycardia  Assessment and Plan: Juan [name] is a 64 y.o. male presenting with feeling generally unwell, found to be tachycardic, hyponatremic, and in alcohol intoxication. PMH is significant for hyponatremia, alcohol abuse, HTN, diastolic CHF, COPD, peripheral neuropathy, RBBB, umbilical hernia  Tachycardia: Could be related to dehydration and/or alcohol withdrawal/anxiety. Does not meet SIRS criteria, no signs of infection. No signs of PE on exam.Improving.  - Monitor on telemetry - CIWA protocol as below - IV hydration - s/p 2 - 1L boluses in ED - Will need to initiate ACS protocol if patient develops CP - iStat troponin neg, initial EKG nonischemic  Hyponatremia: Na 125 on admission, seems to be chronic issue with Na recently 126. Likely related to beer potomania in setting of chronic alcoholism  - Nutrition consulted - Continue home resource breeze and heart healthy diet.  Alcohol abuse: Reports last drink 1/9. Tremulous on exam and reporting anxiety. Stomach mildly distended, stable from previously. CIWA score max yesterday was 16 with 3 Ativan's. - SDU CIWA protocol and monitor vitals. - SW c/s for substance abuse and to help find treatment program he will go to  - daily PPI for GI ppx  COPD: Doesn't appear to be having an exacerbation. Lung exam and CXR clear. Reported dyspnea not evident on exam and pt reported it occurred after an anxiety-inducing ambulance ride. - Hx of aspiration pneumonia and with recent episodes of emesis. Would need to cover for aspiration if initiating ABX.  -Continue home Combivent, Spiriva,  singulair, flonase  Diastolic CHF: echo 09/2013 EF 83-41%, grade 1 diastolic dysfunction. Does not appear fluid overloaded on exam, doubt exacerbation.  -Continue home lasix, spironolactone (half dose), K, aspirin -strict I's and O's  HTN: BP mildly hypertensive while in the ED O/N. Has not taken meds today. -Continue home Spiro and Lisinopril (at half dose while on Ativan), Lasix, Coreg  Peripheral Neuropathy:Stable, no complaints currently -Continue home Gabapentin  Anxiety/depression: Stable, likely anxious in setting of alcohol withdrawal -Continue Cymbalta - Holding home Klonopin, Ativan - on CIWA protocol  Psoriasis: better control than previously, mild excoriations of legs -Continue home triamcinolone cr  Lung Nodule seen on imaging: noticed on CT scan 06/14/13 and hasn't had a follow up CT - consider CT chest w/out contrast as an outpatient  Tobacco abuse: 60 pack year hx. Contemplative about quitting.  - smoking cessation counseling  Umbilical hernia: not incarcerated. Previously was encouraged to be cleared by Cardiology in order to have surgery.  - outpatient f/u with cardiology and surgery   FEN/GI: heart healthy diet Prophylaxis: Lovenox  Disposition: Continue current management as above; will observe another day.   Subjective:  Patient doing well this morning. He is wanting to know if he can go home. When the conversation about going to a rehab center comes up patient says he will eventually go but feels as though he needs to go home and rest first. He does not seem motivated to go to treatment right now. He says his symptoms have improved and he feels better.  Objective: Temp:  [97.9 F (36.6 C)-98.7 F (37.1 C)] 98.3 F (36.8 C) (01/11 0308) Pulse Rate:  [100-116] 100 (01/11  0308) Resp:  [16-24] 21 (01/11 0308) BP: (117-158)/(70-96) 118/70 mmHg (01/11 0308) SpO2:  [96 %-99 %] 99 % (01/11 0308) Physical Exam: General: discheveled appearing, laying in  bed, NAD, tremulous HEENT: NCAT, EOMI, MMM, poor dentition, sclera clear Cardiovascular: RRR, no m/r/g. 2+ DP/radial pulses b/l Respiratory: CTAB, no w/r/c. Comfortable work of breathing Abdomen: Soft, obese, NDNT, large umbilical hernia Extremities/Skin: WWP, some excoriations, well controlled psoriasis plaques seen on legs compared to previously. No edema Neuro: A&Ox3, CN 2-12 grossly intact. Tremulous, speech normal.  Laboratory: Results for orders placed or performed during the hospital encounter of 02/12/14 (from the past 24 hour(s))  CBC     Status: Abnormal   Collection Time: 02/13/14  8:55 AM  Result Value Ref Range   WBC 4.3 4.0 - 10.5 K/uL   RBC 4.05 (L) 4.22 - 5.81 MIL/uL   Hemoglobin 12.7 (L) 13.0 - 17.0 g/dL   HCT 16.1 (L) 09.6 - 04.5 %   MCV 90.1 78.0 - 100.0 fL   MCH 31.4 26.0 - 34.0 pg   MCHC 34.8 30.0 - 36.0 g/dL   RDW 40.9 81.1 - 91.4 %   Platelets 257 150 - 400 K/uL  Basic metabolic panel     Status: Abnormal   Collection Time: 02/13/14  8:55 AM  Result Value Ref Range   Sodium 127 (L) 135 - 145 mmol/L   Potassium 4.6 3.5 - 5.1 mmol/L   Chloride 95 (L) 96 - 112 mEq/L   CO2 18 (L) 19 - 32 mmol/L   Glucose, Bld 179 (H) 70 - 99 mg/dL   BUN 16 6 - 23 mg/dL   Creatinine, Ser 7.82 0.50 - 1.35 mg/dL   Calcium 7.7 (L) 8.4 - 10.5 mg/dL   GFR calc non Af Amer 72 (L) >90 mL/min   GFR calc Af Amer 84 (L) >90 mL/min   Anion gap 14 5 - 15  CBC     Status: Abnormal   Collection Time: 02/14/14  2:15 AM  Result Value Ref Range   WBC 7.4 4.0 - 10.5 K/uL   RBC 3.44 (L) 4.22 - 5.81 MIL/uL   Hemoglobin 10.7 (L) 13.0 - 17.0 g/dL   HCT 95.6 (L) 21.3 - 08.6 %   MCV 91.9 78.0 - 100.0 fL   MCH 31.1 26.0 - 34.0 pg   MCHC 33.9 30.0 - 36.0 g/dL   RDW 57.8 46.9 - 62.9 %   Platelets 212 150 - 400 K/uL  Basic metabolic panel     Status: Abnormal   Collection Time: 02/14/14  2:15 AM  Result Value Ref Range   Sodium 126 (L) 135 - 145 mmol/L   Potassium 4.2 3.5 - 5.1 mmol/L    Chloride 99 96 - 112 mEq/L   CO2 22 19 - 32 mmol/L   Glucose, Bld 107 (H) 70 - 99 mg/dL   BUN 15 6 - 23 mg/dL   Creatinine, Ser 5.28 0.50 - 1.35 mg/dL   Calcium 7.6 (L) 8.4 - 10.5 mg/dL   GFR calc non Af Amer 89 (L) >90 mL/min   GFR calc Af Amer >90 >90 mL/min   Anion gap 5 5 - 15   CIWA Last 3 scores 3-4  Imaging/Diagnostic Tests: Dg Chest 2 View  02/12/2014  IMPRESSION: No evidence of pneumonia or other acute finding. Evidence of COPD with chronic bronchitic change.   Electronically Signed   By: Esperanza Heir M.D.   On: 02/12/2014 20:28    Pincus Large, DO  02/14/2014, 7:49 AM PGY-1, [facility] FPTS Intern pager: [phone], text pages welcome

## 2014-02-14 NOTE — Progress Notes (Signed)
Report given to Asher Muir, Charity fundraiser. Will transfer to 5W.

## 2014-02-14 NOTE — Progress Notes (Signed)
INITIAL NUTRITION ASSESSMENT  DOCUMENTATION CODES Per approved criteria  -Non-severe (moderate) malnutrition in the context of chronic illness   INTERVENTION:  Continue Resource Breeze PO TID, each supplement provides 250 kcal and 9 grams of protein  NUTRITION DIAGNOSIS: Malnutrition related to inadequate protein intake as evidenced by mild depletion of muscle and subcutaneous fat mass.   Goal: Intake to meet >90% of estimated nutrition needs.  Monitor:  PO intake, labs, weight trend.  Reason for Assessment: MD Consult for beer potomania hyponatremia  64 y.o. male  Admitting Dx: Tachycardia  ASSESSMENT: Patient presented to the ED on 1/9 with tachycardia, hyponatremia, and in alcohol intoxication. Hyponatremia suspected to be related to beer potomania.   Patient reports that he tries to eat 3 meals a day at home, but finds it hard to eat enough. He requested that the RD unplug him so he could go home. Seemed confused during RD visit. Suspect intake of protein was poor PTA given heavy ETOH use. Currently eating 100% of meals and drinking Resource Breeze between meals. Drinks Breeze supplement at home.   Nutrition Focused Physical Exam:  Subcutaneous Fat:  Orbital Region: WNL Upper Arm Region: mild depletion Thoracic and Lumbar Region: WNL  Muscle:  Temple Region: WNL Clavicle Bone Region: WNL Clavicle and Acromion Bone Region: WNL Scapular Bone Region: WNL Dorsal Hand: WNL Patellar Region: mild depletion Anterior Thigh Region: mild depletion Posterior Calf Region: moderate depletion  Edema: none  Height: Ht Readings from Last 1 Encounters:  02/13/14 5\' 8"  (1.727 m)    Weight: Wt Readings from Last 1 Encounters:  02/13/14 184 lb 1.4 oz (83.5 kg)    Ideal Body Weight: 70 kg  % Ideal Body Weight: 119%  Wt Readings from Last 10 Encounters:  02/13/14 184 lb 1.4 oz (83.5 kg)  02/09/14 182 lb 1.6 oz (82.6 kg)  01/31/14 186 lb 9.6 oz (84.641 kg)  01/26/14 182  lb 6.9 oz (82.75 kg)  01/04/14 192 lb 9.6 oz (87.363 kg)  12/14/13 183 lb 8 oz (83.235 kg)  12/01/13 186 lb 8 oz (84.596 kg)  11/19/13 188 lb 0.8 oz (85.3 kg)  10/26/13 188 lb 12.8 oz (85.639 kg)  08/03/13 187 lb 3.2 oz (84.913 kg)    Usual Body Weight: 188 lb  % Usual Body Weight: 98%  BMI:  Body mass index is 28 kg/(m^2).  Estimated Nutritional Needs: Kcal: 2000-2200 Protein: 100-120 gm Fluid: 2 L  Skin: no issues  Diet Order: Diet Heart  EDUCATION NEEDS: -Education not appropriate at this time   Intake/Output Summary (Last 24 hours) at 02/14/14 0925 Last data filed at 02/14/14 0700  Gross per 24 hour  Intake   2180 ml  Output   1953 ml  Net    227 ml    Last BM: 1/9   Labs:   Recent Labs Lab 02/12/14 1849 02/13/14 0855 02/14/14 0215  NA 125* 127* 126*  K 4.1 4.6 4.2  CL 94* 95* 99  CO2 20 18* 22  BUN 15 16 15   CREATININE 0.89 1.08 0.93  CALCIUM 7.8* 7.7* 7.6*  MG 1.9  --   --   GLUCOSE 145* 179* 107*    CBG (last 3)  No results for input(s): GLUCAP in the last 72 hours.  Scheduled Meds: . aspirin EC  81 mg Oral Daily  . atorvastatin  40 mg Oral q1800  . carvedilol  6.25 mg Oral BID WC  . Chlorhexidine Gluconate Cloth  6 each Topical Q0600  .  dicyclomine  20 mg Oral TID AC  . DULoxetine  30 mg Oral Daily  . enoxaparin (LOVENOX) injection  40 mg Subcutaneous Q24H  . feeding supplement (RESOURCE BREEZE)  1 Container Oral TID BM  . fluticasone  2 spray Each Nare Daily  . folic acid  1 mg Oral Daily  . furosemide  40 mg Oral Daily  . lisinopril  10 mg Oral Daily  . montelukast  10 mg Oral QHS  . multivitamin with minerals  1 tablet Oral Daily  . mupirocin ointment  1 application Nasal BID  . pantoprazole  20 mg Oral Daily  . potassium chloride SA  40 mEq Oral Daily  . sodium chloride  3 mL Intravenous Q12H  . spironolactone  12.5 mg Oral Daily  . thiamine  100 mg Oral Daily  . tiotropium  18 mcg Inhalation Daily    Continuous  Infusions: . sodium chloride 100 mL/hr at 02/14/14 0600    Past Medical History  Diagnosis Date  . CHF (congestive heart failure) 10/2010    ECHO:  EF 40%, Grade II diastolic dysfunction  . Alcoholism   . Allergy     seasonal   . Anxiety   . Depression   . Asthma   . Hyperlipidemia   . Hypertension   . Seizures     Past Surgical History  Procedure Laterality Date  . Removal of ingrown toenail      Joaquin Courts, RD, LDN, CNSC Pager [phone] After Hours Pager [phone]

## 2014-02-14 NOTE — Progress Notes (Signed)
Attempted to call report to 5W, left name and phone number for callback.

## 2014-02-14 NOTE — Progress Notes (Signed)
Attempted to call report again to 5W, RN to call back.

## 2014-02-15 ENCOUNTER — Other Ambulatory Visit: Payer: Self-pay | Admitting: Family Medicine

## 2014-02-15 LAB — CBC
HCT: 33.7 % — ABNORMAL LOW (ref 39.0–52.0)
Hemoglobin: 11.7 g/dL — ABNORMAL LOW (ref 13.0–17.0)
MCH: 31.8 pg (ref 26.0–34.0)
MCHC: 34.7 g/dL (ref 30.0–36.0)
MCV: 91.6 fL (ref 78.0–100.0)
PLATELETS: 163 10*3/uL (ref 150–400)
RBC: 3.68 MIL/uL — ABNORMAL LOW (ref 4.22–5.81)
RDW: 13.3 % (ref 11.5–15.5)
WBC: 8.4 10*3/uL (ref 4.0–10.5)

## 2014-02-15 NOTE — Discharge Summary (Signed)
Family Medicine Teaching [facility] [place] Discharge Summary  Patient name: Juan [name] Medical record number: [id number] Date of birth: 09-20-1950 Age: 64 y.o. Gender: male Date of Admission: 02/12/2014  Date of Discharge: 02/15/2014 Admitting Physician: Carney Living, MD  Primary Care Provider: Renold Don, MD Consultants: None  Indication for Hospitalization: Malaise in the setting of possible alcohol withdrawl  Discharge Diagnoses/Problem List:   Patient Active Problem List   Diagnosis Date Noted  . Tachycardia 02/12/2014  . COPD exacerbation   . Hypokalemia 01/26/2014  . Alcohol withdrawal 01/25/2014  . SOB (shortness of breath)   . Chronic diastolic heart failure   . Intractable nausea and vomiting 01/23/2014  . Peripheral neuropathy 12/15/2013  . Diarrhea 11/28/2013  . ETOH abuse 11/18/2013  . Bronchiectasis without acute exacerbation 06/30/2013  . Lung nodule seen on imaging study 06/15/2013  . Chronic systolic heart failure 06/13/2013  . Transaminitis 06/13/2013  . RBBB 11/09/2012  . Hyponatremia 11/08/2012  . Seizure due to alcohol withdrawal 11/08/2012  . Umbilical hernia 08/06/2012  . Smoker unmotivated to quit 07/22/2012  . Tremor 07/30/2011  . HTN (hypertension) 07/30/2011  . Hepatomegaly 11/05/2010  . Depression 11/05/2010  . Anxiety 11/05/2010    Disposition: Home  Discharge Condition: Improved  Discharge Exam:  Filed Vitals:   02/15/14 1357  BP: 128/83  Pulse: 114  Temp: 99.4 F (37.4 C)  Resp: 20   General: discheveled appearing, laying in bed, NAD, tremulous HEENT: NCAT, EOMI, MMM, poor dentition, sclera clear Cardiovascular: RRR, no m/r/g. 2+ DP/radial pulses b/l Respiratory: CTAB, no w/r/c. Comfortable work of breathing Abdomen: Soft, obese, NDNT, large umbilical hernia Extremities/Skin: WWP, some excoriations, well controlled psoriasis plaques seen on legs compared to previously. No edema Neuro: A&Ox3, CN 2-12 grossly intact.  Tremulous, speech normal.  Brief Hospital Course:  Juan [name] is a 64 y.o. male who presented with with feeling generally unwell, found to be tachycardic, hyponatremic, and in alcohol intoxication. PMH is significant for hyponatremia, alcohol abuse, HTN, diastolic CHF, COPD, peripheral neuropathy, RBBB, and umbilical hernia. Patient's tachycardia related to dehydration in the setting of alcohol intoxication. He did not meet SIRS criteria and there were no signs of infection. He was given IV hydration with resolution of tachycardia. Patient has long-term history of alcohol abuse. On initial exam he was tremulous and reporting anxiety. CIWA protocol was initiated vitals monitored. During this admission patient had a CIWA score with a max of 16. He received Ativan per protocol. At time of discharge patient did not appear to be in DTs or alcohol withdrawal. Social work was consulted for potential inpatient rehabilitation. Patient declined at this time and said he would consider outpatient therapy. Patient noted to have some gait instability. PT was consulted and suggested outpatient therapy.  The patient's other chronic conditions, including psoriasis, depression, peripheral neuropathy, COPD, and CHF were stable during this hospitalization and the patient was continued on his home medications.   Issues for Follow Up:  1. PT recommended outpatient PT follow-up 2. Continue discussions about alcohol cessation 3. Monitor readiness for rehabilitation 4. Follow patient's hyponatremia and hypocalcemia 5. Patient needs repeat CT chest without contrast for lung nodule noticed on CT on 06/14/13  Significant Procedures: None  Significant Labs and Imaging:   Recent Labs Lab 02/13/14 0855 02/14/14 0215 02/15/14 1238  WBC 4.3 7.4 8.4  HGB 12.7* 10.7* 11.7*  HCT 36.5* 31.6* 33.7*  PLT 257 212 163    Recent Labs Lab 02/12/14 1849 02/13/14 0855 02/14/14 0215  NA 125* 127* 126*  K 4.1 4.6 4.2   CL 94* 95* 99  CO2 20 18* 22  GLUCOSE 145* 179* 107*  BUN CREATININE 0.89 1.08 0.93  CALCIUM 7.8* 7.7* 7.6*  MG 1.9  --   --   ALKPHOS 84  --   --   AST 39*  --   --   ALT 25  --   --   ALBUMIN 3.7  --   --    Dg Chest 2 View 02/12/2014 IMPRESSION: No evidence of pneumonia or other acute finding. Evidence of COPD with chronic bronchitic change.   Results/Tests Pending at Time of Discharge: None  Discharge Medications:    Medication List    STOP taking these medications        clonazePAM 0.5 MG tablet  Commonly known as:  KLONOPIN     doxycycline 100 MG tablet  Commonly known as:  VIBRA-TABS     Ipratropium-Albuterol 20-100 MCG/ACT Aers respimat  Commonly known as:  COMBIVENT RESPIMAT     LORazepam 1 MG tablet  Commonly known as:  ATIVAN     predniSONE 50 MG tablet  Commonly known as:  DELTASONE      TAKE these medications        acetaminophen 500 MG tablet  Commonly known as:  TYLENOL  Take 1,500 mg by mouth every 6 (six) hours as needed for headache (headache).     aspirin 81 MG EC tablet  Take 1 tablet (81 mg total) by mouth daily.     atorvastatin 40 MG tablet  Commonly known as:  LIPITOR  Take 1 tablet by mouth daily at 6 pm. Needs office visit.     B-Complex Caps  Take 1 capsule by mouth daily.     carvedilol 6.25 MG tablet  Commonly known as:  COREG  Take 1 tablet (6.25 mg total) by mouth 2 (two) times daily with a meal. MUST KEEP APPOINTMENT 02/17/2014 WITH DR HILTY FOR FUTURE REFILLS.     dicyclomine 20 MG tablet  Commonly known as:  BENTYL  Take 1 tablet (20 mg total) by mouth 3 (three) times daily before meals.     DULoxetine 30 MG capsule  Commonly known as:  CYMBALTA  Take 1 capsule (30 mg total) by mouth daily.     feeding supplement (RESOURCE BREEZE) Liqd  Take 1 Container by mouth 3 (three) times daily between meals. Chocolate or whatever flavor desired by patient     fluticasone 50 MCG/ACT nasal spray  Commonly known  as:  FLONASE  Use two sprays in each nostril daily     furosemide 40 MG tablet  Commonly known as:  LASIX  Take 1 tablet by mouth daily. Needs office visit.     gabapentin 300 MG capsule  Commonly known as:  NEURONTIN  Take 1 capsule (300 mg total) by mouth 3 (three) times daily.     lisinopril 20 MG tablet  Commonly known as:  PRINIVIL,ZESTRIL  Take 1 tablet (20 mg total) by mouth daily. MUST KEEP APPOINTMENT 02/17/2014 WITH DR HILTY FOR FUTURE REFILLS.     Magnesium 500 MG Tabs  Take 500 mg by mouth daily.     montelukast 10 MG tablet  Commonly known as:  SINGULAIR  Take 1 tablet (10 mg total) by mouth at bedtime.     multivitamin with minerals Tabs tablet  Take 1 tablet by mouth daily.     mupirocin ointment 2 %  Commonly known as:  BACTROBAN  Place 1 application into the nose 2 (two) times daily.     potassium chloride SA 20 MEQ tablet  Commonly known as:  K-DUR,KLOR-CON  TAKE TWO TABLETS BY MOUTH DAILY     spironolactone 25 MG tablet  Commonly known as:  ALDACTONE  TAKE HALF TABLET BY MOUTH DAILY. needs appointment for further refills.     tiotropium 18 MCG inhalation capsule  Commonly known as:  SPIRIVA  Place 1 capsule (18 mcg total) into inhaler and inhale daily.     triamcinolone 0.025 % ointment  Commonly known as:  KENALOG  Apply 1 application topically 2 (two) times daily.     Vitamin D (Ergocalciferol) 50000 UNITS Caps capsule  Commonly known as:  DRISDOL  Take 1 capsule (50,000 Units total) by mouth every 7 (seven) days.        Discharge Instructions: Please refer to Patient Instructions section of EMR for full details.  Patient was counseled important signs and symptoms that should prompt return to medical care, changes in medications, dietary instructions, activity restrictions, and follow up appointments.   Follow-Up Appointments: Follow-up Information    Follow up with Chrystie Nose, MD On 02/17/2014.   Specialty:  Cardiology   Why:  :15a     Contact information:   [address] [place] [address] [phone]       Follow up with Renold Don, MD On 02/18/2014.   Specialty:  Family Medicine   Why:  :45a for hospital follow-up   Contact information:   [address] [place] [address] [phone]       Pincus Large, DO 02/16/2014, 10:23 PM PGY-1, [facility]

## 2014-02-15 NOTE — Evaluation (Signed)
Physical Therapy Evaluation Patient Details Name: Juan [name] MRN: [id number] DOB: 12/16/1950 Today's Date: 02/15/2014   History of Present Illness  Juan [name] is a 64 y.o. male adm 02/12/14 with tachycardia, hyponatremia likely due to alcohol withdrawal/toxicity. PMH is significant for alcoholism, HTN, diastolic HF, hyponatremia, seizures 2/2 alcohol withdrawal  Clinical Impression  Pt agreeable to PT assessment and endorses he has decreased balance even when he has not been drinking alcohol. He scored 45/56 on Berg Balance Assessment, which puts him at moderate-significant risk of falling in upcoming 6 months and research has shown he should use a cane at all times. He prefers to use his "walking stick" that he has at home. We discussed potential benefits of OPPT and improving his balance, however he wants to think about this and will talk to his doctor on a follow-up visit.    Follow Up Recommendations Outpatient PT;Supervision for mobility/OOB (Pt refusing at this time--states he wants to wait until he's home a few days and see how he feels)    Equipment Recommendations  None recommended by PT    Recommendations for Other Services       Precautions / Restrictions Precautions Precautions: Fall Precaution Comments: pt reports most falls occur when he's been drinking, but sometimes just "gets off balance even when I have'nt been drinking"      Mobility  Bed Mobility Overal bed mobility: Modified Independent                Transfers Overall transfer level: Needs assistance Equipment used: 1 person hand held assist Transfers: Sit to/from Stand Sit to Stand: Min assist;Supervision         General transfer comment: x 3; from bed, required HHA due to dizziness/lightheaded; in bathroom stood with grab bar  Ambulation/Gait Ambulation/Gait assistance: Min guard;Supervision Ambulation Distance (Feet): 200 Feet Assistive device: 1 person hand held assist (to  simulate walking stick) Gait Pattern/deviations: Step-through pattern;Decreased stride length;Wide base of support;Trunk flexed   Gait velocity interpretation: Below normal speed for age/gender General Gait Details: unable to significantly change velocity  Stairs            Wheelchair Mobility    Modified Rankin (Stroke Patients Only)       Balance Overall balance assessment: History of Falls                               Standardized Balance Assessment Standardized Balance Assessment : Berg Balance Test;Dynamic Gait Index Berg Balance Test Sit to Stand: Able to stand  independently using hands Standing Unsupported: Able to stand safely 2 minutes Sitting with Back Unsupported but Feet Supported on Floor or Stool: Able to sit safely and securely 2 minutes Stand to Sit: Sits safely with minimal use of hands Transfers: Able to transfer safely, minor use of hands Standing Unsupported with Eyes Closed: Able to stand 10 seconds safely Standing Ubsupported with Feet Together: Able to place feet together independently but unable to hold for 30 seconds From Standing, Reach Forward with Outstretched Arm: Can reach forward >12 cm safely (5") From Standing Position, Pick up Object from Floor: Able to pick up shoe safely and easily From Standing Position, Turn to Look Behind Over each Shoulder: Looks behind from both sides and weight shifts well Turn 360 Degrees: Able to turn 360 degrees safely in 4 seconds or less Standing Unsupported, Alternately Place Feet on Step/Stool: Able to complete >2 steps/needs minimal assist  Standing Unsupported, One Foot in Front: Able to plae foot ahead of the other independently and hold 30 seconds Standing on One Leg: Tries to lift leg/unable to hold 3 seconds but remains standing independently Total Score: 45 Dynamic Gait Index Level Surface: Mild Impairment Change in Gait Speed: Mild Impairment Gait with Horizontal Head Turns: Mild  Impairment Gait with Vertical Head Turns: Mild Impairment Gait and Pivot Turn: Normal Step Over Obstacle: Normal Step Around Obstacles: Mild Impairment       Pertinent Vitals/Pain Pain Assessment: No/denies pain    Home Living Family/patient expects to be discharged to:: Private residence Living Arrangements: Spouse/significant other Available Help at Discharge: Family;Available PRN/intermittently Type of Home: House Home Access: Stairs to enter   Entrance Stairs-Number of Steps: 1 Home Layout: One level Home Equipment: Walker - 2 wheels;Other (comment) (walking stick-reports he can put regular tip on it)      Prior Function Level of Independence: Independent with assistive device(s)               Hand Dominance   Dominant Hand: Right    Extremity/Trunk Assessment   Upper Extremity Assessment: Overall WFL for tasks assessed           Lower Extremity Assessment: Generalized weakness      Cervical / Trunk Assessment: Kyphotic  Communication   Communication: No difficulties  Cognition Arousal/Alertness: Awake/alert Behavior During Therapy: WFL for tasks assessed/performed Overall Cognitive Status: Within Functional Limits for tasks assessed                      General Comments      Exercises        Assessment/Plan    PT Assessment All further PT needs can be met in the next venue of care  PT Diagnosis Difficulty walking   PT Problem List Decreased strength;Decreased balance;Decreased knowledge of use of DME  PT Treatment Interventions     PT Goals (Current goals can be found in the Care Plan section) Acute Rehab PT Goals PT Goal Formulation: All assessment and education complete, DC therapy    Frequency     Barriers to discharge        Co-evaluation               End of Session Equipment Utilized During Treatment: Gait belt Activity Tolerance: Patient tolerated treatment well Patient left: in chair;with call bell/phone  within reach Nurse Communication: Mobility status         Time: 3736-6815 PT Time Calculation (min) (ACUTE ONLY): 30 min   Charges:   PT Evaluation $Initial PT Evaluation Tier I: 1 Procedure PT Treatments $Gait Training: 8-22 mins   PT G Codes:        Graylee Arutyunyan Mar 02, 2014, 1:51 PM Pager [phone]

## 2014-02-15 NOTE — Progress Notes (Signed)
Family Medicine Teaching Service Daily Progress Note Intern Pager: [phone]  Patient name: Juan [name] Medical record number: [id number] Date of birth: 30-Oct-1950 Age: 64 y.o. Gender: male  Primary Care Provider: Renold Don, MD Consultants: None Code Status: Full  Pt Overview and Major Events to Date:  1/10: Admitted for withdrawal symtpoms and tachycardia  Assessment and Plan: Juan [name] is a 65 y.o. male presenting with feeling generally unwell, found to be tachycardic, hyponatremic, and in alcohol intoxication. PMH is significant for hyponatremia, alcohol abuse, HTN, diastolic CHF, COPD, peripheral neuropathy, RBBB, umbilical hernia  Tachycardia: Could be related to dehydration and/or alcohol withdrawal/anxiety. Does not meet SIRS criteria, no signs of infection. No signs of PE on exam. Resolved. - Monitor on telemetry - CIWA protocol as below - IV hydration - s/p 2 - 1L boluses in ED - Will need to initiate ACS protocol if patient develops CP - iStat troponin neg, initial EKG nonischemic  Hyponatremia: Na 125 on admission, seems to be chronic issue with Na recently 126. Likely related to beer potomania in setting of chronic alcoholism  - Nutrition consulted - Continue home resource breeze and heart healthy diet.  Alcohol abuse: Reports last drink 1/9. Tremulous on exam and reporting anxiety. Stomach mildly distended, stable from previously. CIWA scores stable. Does not appear to be in DT's or alcohol withdrawal currently.  -CIWA protocol and monitor vitals. - SW c/s for substance abuse and to help find treatment program he will go to - information given to patient and wife - daily PPI for GI ppx -repeat CBC for Hbg stability -PT consulted for gait instability  COPD: Doesn't appear to be having an exacerbation. Lung exam and CXR clear. Reported dyspnea not evident on exam and pt reported it occurred after an anxiety-inducing ambulance ride. - Hx of aspiration  pneumonia and with recent episodes of emesis. Would need to cover for aspiration if initiating ABX.  -Continue home Combivent, Spiriva, singulair, flonase  Diastolic CHF: echo 09/2013 EF 14-78%, grade 1 diastolic dysfunction. Does not appear fluid overloaded on exam, doubt exacerbation.  -Continue home lasix, spironolactone (half dose), K, aspirin -strict I's and O's  HTN: BP mildly hypertensive while in the ED O/N. Has not taken meds today. -Continue home Spiro and Lisinopril (at half dose while on Ativan), Lasix, Coreg  Peripheral Neuropathy:Stable, no complaints currently -Continue home Gabapentin  Anxiety/depression: Stable, likely anxious in setting of alcohol withdrawal -Continue Cymbalta - Holding home Klonopin, Ativan - on CIWA protocol  Psoriasis: better control than previously, mild excoriations of legs -Continue home triamcinolone cr  Lung Nodule seen on imaging: noticed on CT scan 06/14/13 and hasn't had a follow up CT - consider CT chest w/out contrast as an outpatient  Tobacco abuse: 60 pack year hx. Contemplative about quitting.  - smoking cessation counseling  Umbilical hernia: not incarcerated. Previously was encouraged to be cleared by Cardiology in order to have surgery.  - outpatient f/u with cardiology and surgery   FEN/GI: heart healthy diet Prophylaxis: Lovenox  Disposition: Continue current management as above; discharge home pending PT eval.  Subjective:  Patient doing well this morning. No overnight events.  Objective: Temp:  [98.1 F (36.7 C)-98.5 F (36.9 C)] 98.2 F (36.8 C) (01/11 2111) Pulse Rate:  [87-97] 87 (01/11 2111) Resp:  [17-25] 17 (01/11 2111) BP: (118-144)/(68-80) 118/74 mmHg (01/11 2111) SpO2:  [95 %-100 %] 95 % (01/11 2111) Physical Exam: General: discheveled appearing, laying in bed, NAD, tremulous HEENT: NCAT, EOMI, MMM, poor  dentition, sclera clear Cardiovascular: RRR, no m/r/g. 2+ DP/radial pulses  b/l Respiratory: CTAB, no w/r/c. Comfortable work of breathing Abdomen: Soft, obese, NDNT, large umbilical hernia Extremities/Skin: WWP, some excoriations, well controlled psoriasis plaques seen on legs compared to previously. No edema Neuro: A&Ox3, CN 2-12 grossly intact. Tremulous, speech normal.  Laboratory: No results found for this or any previous visit (from the past 24 hour(s)). CIWA Last 3 scores 0-1  Imaging/Diagnostic Tests: Dg Chest 2 View  02/12/2014  IMPRESSION: No evidence of pneumonia or other acute finding. Evidence of COPD with chronic bronchitic change.   Electronically Signed   By: Esperanza Heir M.D.   On: 02/12/2014 20:28    Pincus Large, DO 02/15/2014, 7:53 AM PGY-1, [facility] FPTS Intern pager: [phone], text pages welcome

## 2014-02-15 NOTE — Discharge Instructions (Signed)
Discharge Date: 02/15/2014  Reason for Hospitalization: Tachycardia and Withdrawal symptoms.  It is important that you consider stopping alcohol as it is causing several adverse symptoms for you. Please look at the information that was provided to you. Your symptoms have improved. Please seek help from a rehabilitation facility if you want to stop drinking.   Thank you for letting us participate in your care!

## 2014-02-15 NOTE — Progress Notes (Signed)
Late Entry: Patient discharged per orders. Patient's wife at bedside for discharge teaching/instructions/follow up care/follow up appointments/medications. Discussed outpatient treatment options for alcohol cessation with patient. Time allowed for questions and concerns. Patient and his wife state they have none. I transported the patient to the main entrance via wheelchair. Patient's wife to transport home. Asher Muir Karon Cotterill,RN

## 2014-02-17 ENCOUNTER — Encounter: Payer: Self-pay | Admitting: Internal Medicine

## 2014-02-17 ENCOUNTER — Ambulatory Visit (INDEPENDENT_AMBULATORY_CARE_PROVIDER_SITE_OTHER): Payer: No Typology Code available for payment source | Admitting: Internal Medicine

## 2014-02-17 VITALS — BP 120/72 | HR 109 | Ht 68.0 in | Wt 184.5 lb

## 2014-02-17 DIAGNOSIS — I5022 Chronic systolic (congestive) heart failure: Secondary | ICD-10-CM

## 2014-02-17 DIAGNOSIS — I10 Essential (primary) hypertension: Secondary | ICD-10-CM

## 2014-02-17 DIAGNOSIS — I4510 Unspecified right bundle-branch block: Secondary | ICD-10-CM

## 2014-02-17 DIAGNOSIS — F1010 Alcohol abuse, uncomplicated: Secondary | ICD-10-CM

## 2014-02-17 MED ORDER — CARVEDILOL 12.5 MG PO TABS: 12.5000 mg | ORAL_TABLET | Freq: Two times a day (BID) | ORAL | Status: DC

## 2014-02-17 NOTE — Patient Instructions (Signed)
Your physician has recommended you make the following change in your medication...  1. INCREASE carvedilol to 12.5mg  twice daily  Your physician wants you to follow-up in: 6 months with Dr. [name]. You will receive a reminder letter in the mail two months in advance. If you don't receive a letter, please call our office to schedule the follow-up appointment.

## 2014-02-17 NOTE — Progress Notes (Signed)
OFFICE NOTE  Chief Complaint:  Hospital follow-up, feeling better  Primary Care Physician: Renold Don, MD  HPI:  Juan [name] is a 64 yo male with a history of heavy drinking for the past 15 years he has been drinking 4-5 drinks a day. He presented to [facility] after seizure like activity. Wife noted rhythmic jerking bilaterally for about 30 seconds. He was unresponsive and post ictal afterwards for about 30 min. Did not bite his tongue, no bladder or bowel incontinence. No history of head injury recently. 2 years ago he had a similar episode after abstaining from alcohol and stopping his klonopin suddenly. This time wife thinks he has been taking his medications. Of note his Zoloft has recently been changed to Effexor. He also presented with an aspiration pneumonia and was treated for such. It was felt that he had acute on chronic systolic heart failure exacerbation. An echocardiogram was performed which demonstrated a severe cardiomyopathy with global hypokinesis and EF of 30-35%.  He underwent a nuclear stress test which did not demonstrate any signs of ischemia therefore was thought that he had a nonischemic cardiomyopathy possibly related to alcohol use.   He was diuresed and then placed on appropriate heart failure medications. A life vest was then placed because he had some episodes of nonsustained ventricular tachycardia in a hospital. It was thought this may be due to a metabolic derangement. Since discharge she has been clinically stable. His weight has remained stable and he has had no further signs of heart failure. He denies any shocks with a life vest. He does report that it may be hurting his back somewhat. He has been abstinent from alcohol since his hospitalization. His only other main concern is that he has history of anxiety and felt that he did better on Zoloft and Effexor versus Effexor alone. He wishes to discuss this with his psychiatrist, but is apparently taken  off of Zoloft due to hyponatremia. I suspect this may be due to heart failure, not the medication. If he does feel that he did better on Zoloft it would be reasonable to consider restarting that. I think anything we can do to keep him from drinking would be best for his heart.  Juan [name] returns today for follow-up. Has not seen him in over year and a half. In the interim he's been hospitalized about 5 times. He's had numerous episodes of alcohol abuse and withdrawal. Fortunately, repeat echo in August 2015 showed his EF had improved back to normal. He discontinue the life vest at that time. It's been very difficult to understand if he's compliant with his medications when he is binging on alcohol. After discharge this time he feels that he is still significantly weak and fatigued. Heart rate is noted to be elevated today with a right bundle branch block. He denies any chest pain or worsening shortness of breath. Weight has been very stable if not less than it had been previously. He did have significant diarrhea.  PMHx:  Past Medical History  Diagnosis Date  . CHF (congestive heart failure) 10/2010    ECHO:  EF 40%, Grade II diastolic dysfunction  . Alcoholism   . Allergy     seasonal   . Anxiety   . Depression   . Asthma   . Hyperlipidemia   . Hypertension   . Seizures     Past Surgical History  Procedure Laterality Date  . Removal of ingrown toenail  FAMHx:  Family History  Problem Relation Age of Onset  . Alzheimer's disease Mother   . Diabetes Mother   . Heart disease Maternal Uncle     SOCHx:   reports that he has been smoking Cigarettes.  He has a 50 pack-year smoking history. He has never used smokeless tobacco. He reports that he drinks alcohol. He reports that he does not use illicit drugs.  ALLERGIES:  No Known Allergies  ROS: A comprehensive review of systems was negative except for: Constitutional: positive for fatigue Respiratory: positive for dyspnea on  exertion Cardiovascular: positive for fatigue Behavioral/Psych: positive for anxiety  HOME MEDS: Current Outpatient Prescriptions  Medication Sig Dispense Refill  . acetaminophen (TYLENOL) 500 MG tablet Take 1,500 mg by mouth every 6 (six) hours as needed for headache (headache).     Marland Kitchen aspirin EC 81 MG EC tablet Take 1 tablet (81 mg total) by mouth daily. 90 tablet 0  . atorvastatin (LIPITOR) 40 MG tablet Take 1 tablet by mouth daily at 6 pm. Needs office visit. 30 tablet 0  . B-Complex CAPS Take 1 capsule by mouth daily.    . carvedilol (COREG) 12.5 MG tablet Take 1 tablet (12.5 mg total) by mouth 2 (two) times daily with a meal. 60 tablet 6  . COMBIVENT RESPIMAT 20-100 MCG/ACT AERS respimat Inhale two puffs by mouth every four hours as needed forWheezing 4 g 2  . dicyclomine (BENTYL) 20 MG tablet Take 1 tablet (20 mg total) by mouth 3 (three) times daily before meals. 90 tablet 0  . DULoxetine (CYMBALTA) 30 MG capsule Take 1 capsule (30 mg total) by mouth daily. 30 capsule 3  . fluticasone (FLONASE) 50 MCG/ACT nasal spray Use two sprays in each nostril daily 16 g 11  . furosemide (LASIX) 40 MG tablet Take 1 tablet by mouth daily. Needs office visit. 30 tablet 0  . gabapentin (NEURONTIN) 300 MG capsule Take 300 mg by mouth 3 (three) times daily as needed.    Marland Kitchen lisinopril (PRINIVIL,ZESTRIL) 20 MG tablet Take 1 tablet (20 mg total) by mouth daily. MUST KEEP APPOINTMENT 02/17/2014 WITH DR HILTY FOR FUTURE REFILLS. 15 tablet 0  . Magnesium 500 MG TABS Take 500 mg by mouth daily.    . montelukast (SINGULAIR) 10 MG tablet Take 1 tablet (10 mg total) by mouth at bedtime. 30 tablet 3  . Multiple Vitamin (MULTIVITAMIN WITH MINERALS) TABS tablet Take 1 tablet by mouth daily.    . mupirocin ointment (BACTROBAN) 2 % Place 1 application into the nose 2 (two) times daily. 22 g 0  . potassium chloride SA (K-DUR,KLOR-CON) 20 MEQ tablet TAKE TWO TABLETS BY MOUTH DAILY  60 tablet 0  . spironolactone  (ALDACTONE) 25 MG tablet TAKE HALF TABLET BY MOUTH DAILY. needs appointment for further refills. 15 tablet 0  . tiotropium (SPIRIVA) 18 MCG inhalation capsule Place 1 capsule (18 mcg total) into inhaler and inhale daily. 30 capsule 6  . triamcinolone (KENALOG) 0.025 % ointment Apply 1 application topically 2 (two) times daily. (Patient taking differently: Apply 1 application topically 2 (two) times daily as needed (for rash). ) 30 g 0  . Vitamin D, Ergocalciferol, (DRISDOL) 50000 UNITS CAPS capsule Take 1 capsule (50,000 Units total) by mouth every 7 (seven) days. 6 capsule 0  . [DISCONTINUED] albuterol (PROVENTIL,VENTOLIN) 90 MCG/ACT inhaler Inhale 2 puffs into the lungs every 6 (six) hours as needed for wheezing. 17 g 0   No current facility-administered medications for this visit.  LABS/IMAGING: No results found for this or any previous visit (from the past 48 hour(s)). No results found.  VITALS: BP 120/72 mmHg  Pulse 109  Ht  (1.727 m)  Wt 184 lb 8 oz (83.689 kg)  BMI 28.06 kg/m2  EXAM: General appearance: alert and no distress Neck: no carotid bruit and no JVD Lungs: clear to auscultation bilaterally Heart: regular rate and rhythm, S1, S2 normal, no murmur, click, rub or gallop Abdomen: soft, non-tender; bowel sounds normal; no masses,  no organomegaly Extremities: extremities normal, atraumatic, no cyanosis or edema Pulses: 2+ and symmetric Skin: Skin color, texture, turgor normal. No rashes or lesions Neurologic: Grossly normal Psych: Does not appear anxious  EKG: Sinus tachycardia at 109, right bundle branch block   ASSESSMENT: 1. Nonischemic cardiomyopathy, EF 30-40% --> improved to 60% 2. Ongoing alcohol abuse, history of withdrawal seizure 3. History of anxiety  PLAN: 1.   Juan [name] is on appropriate heart failure medications. His heart rate remains elevated and could tolerate additional beta blocker. I would recommend increasing his carvedilol to 12.5  mg twice daily. His wife indicates that he is given some thought to possibly having elective hernia surgery. I think this would be possibly acceptable risk however would need to have another echo to reassure myself that his EF has remained stable. Additionally he appears to be on adequate heart failure medications. We may need to watch ongoing use of Lipitor given his significant alcohol use. I think he needs to focus on sobriety and better nutrition before considering surgery. Plan to see him back annually or sooner as necessary.  Chrystie Nose, MD, [facility] Attending Cardiologist [facility]  HILTY,Kenneth C 02/17/2014, 2:30 PM

## 2014-02-18 ENCOUNTER — Ambulatory Visit (INDEPENDENT_AMBULATORY_CARE_PROVIDER_SITE_OTHER): Payer: No Typology Code available for payment source | Admitting: Family Medicine

## 2014-02-18 ENCOUNTER — Encounter: Payer: Self-pay | Admitting: Family Medicine

## 2014-02-18 VITALS — BP 138/89 | HR 104 | Temp 97.7°F | Ht 68.0 in | Wt 184.0 lb

## 2014-02-18 DIAGNOSIS — F1010 Alcohol abuse, uncomplicated: Secondary | ICD-10-CM

## 2014-02-18 DIAGNOSIS — G629 Polyneuropathy, unspecified: Secondary | ICD-10-CM

## 2014-02-18 NOTE — Patient Instructions (Addendum)
My biggest recommendation to you today is to call [facility] for help with getting off of alcohol.  I'd like to see you back the week after next to check in.    Let me know about if you'd like to try a Lidocaine patch for you back if it's bothering you.

## 2014-02-18 NOTE — Progress Notes (Signed)
Subjective:    Juan [name] is a 64 y.o. male who presents to [facility] today for hospital FU:  1.  Hospital FU:  Came home on Tuesday Pm.  Still feels very weak, was weak in hospital.  Somewhat unbalanced while walking, using cane to ambulate.  Admitted for alcohol withdrawal.  He has consistently declined inpatient rehab for alcohol, declines again today.  States he will try outpatient via [facility] or [facility].    Admits to still drinking 3 oz of liquor a day.   No diarrhea, palpitations, chest pain, dyspnea.  No abdominal pain or N/V.  No fevers or chills.    The following portions of the patient's history were reviewed and updated as appropriate: allergies, current medications, past medical history, family and social history, and problem list. Patient is a nonsmoker.    PMH reviewed.  Past Medical History  Diagnosis Date  . CHF (congestive heart failure) 10/2010    ECHO:  EF 40%, Grade II diastolic dysfunction  . Alcoholism   . Allergy     seasonal   . Anxiety   . Depression   . Asthma   . Hyperlipidemia   . Hypertension   . Seizures    Past Surgical History  Procedure Laterality Date  . Removal of ingrown toenail      Medications reviewed. Current Outpatient Prescriptions  Medication Sig Dispense Refill  . acetaminophen (TYLENOL) 500 MG tablet Take 1,500 mg by mouth every 6 (six) hours as needed for headache (headache).     Marland Kitchen aspirin EC 81 MG EC tablet Take 1 tablet (81 mg total) by mouth daily. 90 tablet 0  . atorvastatin (LIPITOR) 40 MG tablet Take 1 tablet by mouth daily at 6 pm. Needs office visit. 30 tablet 0  . B-Complex CAPS Take 1 capsule by mouth daily.    . carvedilol (COREG) 12.5 MG tablet Take 1 tablet (12.5 mg total) by mouth 2 (two) times daily with a meal. 60 tablet 6  . COMBIVENT RESPIMAT 20-100 MCG/ACT AERS respimat Inhale two puffs by mouth every four hours as needed forWheezing 4 g 2  . dicyclomine (BENTYL) 20 MG tablet Take 1 tablet (20 mg  total) by mouth 3 (three) times daily before meals. 90 tablet 0  . DULoxetine (CYMBALTA) 30 MG capsule Take 1 capsule (30 mg total) by mouth daily. 30 capsule 3  . fluticasone (FLONASE) 50 MCG/ACT nasal spray Use two sprays in each nostril daily 16 g 11  . furosemide (LASIX) 40 MG tablet Take 1 tablet by mouth daily. Needs office visit. 30 tablet 0  . gabapentin (NEURONTIN) 300 MG capsule Take 300 mg by mouth 3 (three) times daily as needed.    Marland Kitchen lisinopril (PRINIVIL,ZESTRIL) 20 MG tablet Take 1 tablet (20 mg total) by mouth daily. MUST KEEP APPOINTMENT 02/17/2014 WITH DR HILTY FOR FUTURE REFILLS. 15 tablet 0  . Magnesium 500 MG TABS Take 500 mg by mouth daily.    . montelukast (SINGULAIR) 10 MG tablet Take 1 tablet (10 mg total) by mouth at bedtime. 30 tablet 3  . Multiple Vitamin (MULTIVITAMIN WITH MINERALS) TABS tablet Take 1 tablet by mouth daily.    . mupirocin ointment (BACTROBAN) 2 % Place 1 application into the nose 2 (two) times daily. 22 g 0  . potassium chloride SA (K-DUR,KLOR-CON) 20 MEQ tablet TAKE TWO TABLETS BY MOUTH DAILY  60 tablet 0  . spironolactone (ALDACTONE) 25 MG tablet TAKE HALF TABLET BY MOUTH DAILY. needs appointment  for further refills. 15 tablet 0  . tiotropium (SPIRIVA) 18 MCG inhalation capsule Place 1 capsule (18 mcg total) into inhaler and inhale daily. 30 capsule 6  . triamcinolone (KENALOG) 0.025 % ointment Apply 1 application topically 2 (two) times daily. (Patient taking differently: Apply 1 application topically 2 (two) times daily as needed (for rash). ) 30 g 0  . Vitamin D, Ergocalciferol, (DRISDOL) 50000 UNITS CAPS capsule Take 1 capsule (50,000 Units total) by mouth every 7 (seven) days. 6 capsule 0  . [DISCONTINUED] albuterol (PROVENTIL,VENTOLIN) 90 MCG/ACT inhaler Inhale 2 puffs into the lungs every 6 (six) hours as needed for wheezing. 17 g 0   No current facility-administered medications for this visit.     Objective:   Physical Exam BP 138/89 mmHg   Pulse 104  Temp(Src) 97.7 F (36.5 C) (Oral)  Ht 5\' 8"  (1.727 m)  Wt 184 lb (83.462 kg)  BMI 27.98 kg/m2  SpO2 95% Gen:  Alert, cooperative patient who appears stated age in no acute distress.  Vital signs reviewed.  Somewhat disheveled appearing though wearing clean clothes.  HEENT: EOMI,  MMM Cardiac:  Regular rate and rhythm with Grade II murmur noted  Pulm:  Clear to auscultation bilaterally    Back:  Kyphosis present  Ext:  No edema Neuro:  Ambulated up and down the hall for me.  Does appear somewhat unsteady on feet, but able to hold a straight line.  No antalgic or other diagnostic gait.  Somewhat tremulous.  No asterixis.  Psych:  Not depressed or anxious appearing.  Denies SI/HI  Results for orders placed or performed during the hospital encounter of 02/12/14 (from the past 72 hour(s))  CBC     Status: Abnormal   Collection Time: 02/15/14 12:38 PM  Result Value Ref Range   WBC 8.4 4.0 - 10.5 K/uL   RBC 3.68 (L) 4.22 - 5.81 MIL/uL   Hemoglobin 11.7 (L) 13.0 - 17.0 g/dL   HCT 72.9 (L) 02.1 - 11.5 %   MCV 91.6 78.0 - 100.0 fL   MCH 31.8 26.0 - 34.0 pg   MCHC 34.7 30.0 - 36.0 g/dL   RDW 52.0 80.2 - 23.3 %   Platelets 163 150 - 400 K/uL    Comment: REPEATED TO VERIFY

## 2014-02-18 NOTE — Assessment & Plan Note (Signed)
Main issue again today.   He tells me he is going to call [facility] today to set up an appoitnment. States wife is at her wit's end, but also "nagging" him which "makes things worse."  Long discussion today about cessation of EtOH on his own versus professional help.  Told him I believe inpatient is best option for him but he refuses this.   Would usually have him FU with me in 1 week - however I am working hospital service next week. FU with me in 2 weeks.

## 2014-02-18 NOTE — Assessment & Plan Note (Signed)
Discussed this is contributing to his somewhat unsteady gait. Another reason for EtOH cessation.

## 2014-03-04 ENCOUNTER — Ambulatory Visit (INDEPENDENT_AMBULATORY_CARE_PROVIDER_SITE_OTHER): Payer: No Typology Code available for payment source | Admitting: Family Medicine

## 2014-03-04 ENCOUNTER — Encounter: Payer: Self-pay | Admitting: Family Medicine

## 2014-03-04 VITALS — BP 119/68 | HR 90 | Temp 98.5°F | Ht 68.0 in | Wt 185.2 lb

## 2014-03-04 DIAGNOSIS — R0602 Shortness of breath: Secondary | ICD-10-CM

## 2014-03-04 DIAGNOSIS — E871 Hypo-osmolality and hyponatremia: Secondary | ICD-10-CM

## 2014-03-04 DIAGNOSIS — F1010 Alcohol abuse, uncomplicated: Secondary | ICD-10-CM

## 2014-03-04 DIAGNOSIS — J471 Bronchiectasis with (acute) exacerbation: Secondary | ICD-10-CM

## 2014-03-04 LAB — CBC
HCT: 35.6 % — ABNORMAL LOW (ref 39.0–52.0)
Hemoglobin: 12.3 g/dL — ABNORMAL LOW (ref 13.0–17.0)
MCH: 31.8 pg (ref 26.0–34.0)
MCHC: 34.6 g/dL (ref 30.0–36.0)
MCV: 92 fL (ref 78.0–100.0)
MPV: 8.8 fL (ref 8.6–12.4)
Platelets: 404 10*3/uL — ABNORMAL HIGH (ref 150–400)
RBC: 3.87 MIL/uL — ABNORMAL LOW (ref 4.22–5.81)
RDW: 14.2 % (ref 11.5–15.5)
WBC: 8.5 10*3/uL (ref 4.0–10.5)

## 2014-03-04 LAB — BASIC METABOLIC PANEL
BUN: 7 mg/dL (ref 6–23)
CO2: 25 mEq/L (ref 19–32)
CREATININE: 0.82 mg/dL (ref 0.50–1.35)
Calcium: 9.1 mg/dL (ref 8.4–10.5)
Chloride: 91 mEq/L — ABNORMAL LOW (ref 96–112)
Glucose, Bld: 108 mg/dL — ABNORMAL HIGH (ref 70–99)
Potassium: 5.1 mEq/L (ref 3.5–5.3)
SODIUM: 124 meq/L — AB (ref 135–145)

## 2014-03-04 MED ORDER — PREDNISONE 20 MG PO TABS
40.0000 mg | ORAL_TABLET | Freq: Every day | ORAL | Status: DC
Start: 2014-03-04 — End: 2014-04-13

## 2014-03-04 MED ORDER — ALBUTEROL SULFATE (2.5 MG/3ML) 0.083% IN NEBU
2.5000 mg | INHALATION_SOLUTION | Freq: Once | RESPIRATORY_TRACT | Status: AC
  Administered 2014-03-04: 2.5 mg via RESPIRATORY_TRACT

## 2014-03-04 MED ORDER — DOXYCYCLINE HYCLATE 100 MG PO TABS: 100.0000 mg | ORAL_TABLET | Freq: Two times a day (BID) | ORAL | Status: DC

## 2014-03-04 MED ORDER — CLONAZEPAM 0.5 MG PO TABS: 0.5000 mg | ORAL_TABLET | Freq: Two times a day (BID) | ORAL | Status: DC | PRN

## 2014-03-04 MED ORDER — IPRATROPIUM BROMIDE 0.02 % IN SOLN
0.5000 mg | Freq: Once | RESPIRATORY_TRACT | Status: AC
  Administered 2014-03-04: 0.5 mg via RESPIRATORY_TRACT

## 2014-03-04 NOTE — Patient Instructions (Signed)
I have sent in some Doxycycline as an antibiotic for your chest.  Take once daily for the next 7 days.  I have also sent in 5 days worth of Prednisone.  Checking labs today.  The most important thing you can do for your health is to call for outpatient therapy with alcoholism.    Please come back and see me in about 1 month or sooner if you feel worse.

## 2014-03-04 NOTE — Assessment & Plan Note (Signed)
Provided albuterol/atrovent treatment today in clinic.   His post-treatment exam was vastly improved.  Still some wheezing and rhonchi noted.  Doxy plus steroids to treat.

## 2014-03-04 NOTE — Progress Notes (Signed)
Subjective:    Juan [name] is a 64 y.o. male who presents to [facility] today:  1.  Alcoholism:  Continue to drink.  Has increased from 3 oz to 5 oz daily.  Has stopped seeing his psychiatrist, stops he was just "giving out pills" and otherwise not offering much counseling.  Has NOT yet called for outpatient health.  States that cough and dyspnea precluded him from following up.    2.  Cough in chest:  Had URI that started about 10 days ago, and about 1 week ago "settled into his chest."  Denies any fevers or chills.  Productive cough that occurs during the day, but worse at night.  Has increased use of his inhaler 4-5 times a day.     ROS as above per HPI, otherwise neg.   The following portions of the patient's history were reviewed and updated as appropriate: allergies, current medications, past medical history, family and social history, and problem list. Patient is a nonsmoker.    PMH reviewed.  Past Medical History  Diagnosis Date  . CHF (congestive heart failure) 10/2010    ECHO:  EF 40%, Grade II diastolic dysfunction  . Alcoholism   . Allergy     seasonal   . Anxiety   . Depression   . Asthma   . Hyperlipidemia   . Hypertension   . Seizures    Past Surgical History  Procedure Laterality Date  . Removal of ingrown toenail      Medications reviewed. Current Outpatient Prescriptions  Medication Sig Dispense Refill  . acetaminophen (TYLENOL) 500 MG tablet Take 1,500 mg by mouth every 6 (six) hours as needed for headache (headache).     Marland Kitchen aspirin EC 81 MG EC tablet Take 1 tablet (81 mg total) by mouth daily. 90 tablet 0  . atorvastatin (LIPITOR) 40 MG tablet Take 1 tablet by mouth daily at 6 pm. Needs office visit. 30 tablet 0  . B-Complex CAPS Take 1 capsule by mouth daily.    . carvedilol (COREG) 12.5 MG tablet Take 1 tablet (12.5 mg total) by mouth 2 (two) times daily with a meal. 60 tablet 6  . COMBIVENT RESPIMAT 20-100 MCG/ACT AERS respimat Inhale two puffs by mouth  every four hours as needed forWheezing 4 g 2  . dicyclomine (BENTYL) 20 MG tablet Take 1 tablet (20 mg total) by mouth 3 (three) times daily before meals. 90 tablet 0  . DULoxetine (CYMBALTA) 30 MG capsule Take 1 capsule (30 mg total) by mouth daily. 30 capsule 3  . fluticasone (FLONASE) 50 MCG/ACT nasal spray Use two sprays in each nostril daily 16 g 11  . furosemide (LASIX) 40 MG tablet Take 1 tablet by mouth daily. Needs office visit. 30 tablet 0  . gabapentin (NEURONTIN) 300 MG capsule Take 300 mg by mouth 3 (three) times daily as needed.    Marland Kitchen lisinopril (PRINIVIL,ZESTRIL) 20 MG tablet Take 1 tablet (20 mg total) by mouth daily. MUST KEEP APPOINTMENT 02/17/2014 WITH DR HILTY FOR FUTURE REFILLS. 15 tablet 0  . Magnesium 500 MG TABS Take 500 mg by mouth daily.    . montelukast (SINGULAIR) 10 MG tablet Take 1 tablet (10 mg total) by mouth at bedtime. 30 tablet 3  . Multiple Vitamin (MULTIVITAMIN WITH MINERALS) TABS tablet Take 1 tablet by mouth daily.    . mupirocin ointment (BACTROBAN) 2 % Place 1 application into the nose 2 (two) times daily. 22 g 0  . potassium chloride SA (K-DUR,KLOR-CON)  20 MEQ tablet TAKE TWO TABLETS BY MOUTH DAILY  60 tablet 0  . spironolactone (ALDACTONE) 25 MG tablet TAKE HALF TABLET BY MOUTH DAILY. needs appointment for further refills. 15 tablet 0  . tiotropium (SPIRIVA) 18 MCG inhalation capsule Place 1 capsule (18 mcg total) into inhaler and inhale daily. 30 capsule 6  . triamcinolone (KENALOG) 0.025 % ointment Apply 1 application topically 2 (two) times daily. (Patient taking differently: Apply 1 application topically 2 (two) times daily as needed (for rash). ) 30 g 0  . Vitamin D, Ergocalciferol, (DRISDOL) 50000 UNITS CAPS capsule Take 1 capsule (50,000 Units total) by mouth every 7 (seven) days. 6 capsule 0  . [DISCONTINUED] albuterol (PROVENTIL,VENTOLIN) 90 MCG/ACT inhaler Inhale 2 puffs into the lungs every 6 (six) hours as needed for wheezing. 17 g 0   No current  facility-administered medications for this visit.     Objective:   Physical Exam BP 119/68 mmHg  Pulse 90  Temp(Src) 98.5 F (36.9 C) (Oral)  Ht 5\' 8"  (1.727 m)  Wt 185 lb 3.2 oz (84.006 kg)  BMI 28.17 kg/m2 Gen:  Alert, cooperative patient who appears stated age in no acute distress.  Vital signs reviewed.  Somewhat disheveled appearing again today.  HEENT: EOMI,  MMM Cardiac:  Regular rate and rhythm  Pulm:  Diffuse wheezing in all lung fields.  Rhonchi at bases BL.   Abd:  Soft/nondistended/nontender.   Neuro:  Alert and oriented.  No focal deficits noted.    No results found for this or any previous visit (from the past 72 hour(s)).

## 2014-03-04 NOTE — Assessment & Plan Note (Signed)
He is at the contemplation phase of alcohol cessation.  However he does NOT want to go outpatient until he is over his COPD exacerbation.   Strong conversation with him today about how alcohol is affecting his life.   He understands this.  Plans to call for outpatient rehab Monday after talking more with his wife.  Unsure if he will FU with this.   Checking hyponatremia today.  I'll be calling him on Monday with lab results and also to ensure he's called outpatient therapy.

## 2014-03-07 ENCOUNTER — Telehealth: Payer: Self-pay | Admitting: Family Medicine

## 2014-03-10 ENCOUNTER — Other Ambulatory Visit: Payer: Self-pay | Admitting: *Deleted

## 2014-03-10 MED ORDER — TIOTROPIUM BROMIDE MONOHYDRATE 18 MCG IN CAPS: 18.0000 ug | ORAL_CAPSULE | Freq: Every day | RESPIRATORY_TRACT | Status: DC

## 2014-03-10 NOTE — Telephone Encounter (Signed)
Called and left message about hyponatremia -- within normal range.  Asked to call back regarding if he's found any help with alcohol cessation.

## 2014-03-15 ENCOUNTER — Other Ambulatory Visit: Payer: Self-pay | Admitting: *Deleted

## 2014-03-16 ENCOUNTER — Other Ambulatory Visit: Payer: Self-pay | Admitting: Internal Medicine

## 2014-03-16 ENCOUNTER — Other Ambulatory Visit: Payer: Self-pay | Admitting: Family Medicine

## 2014-03-16 MED ORDER — TIOTROPIUM BROMIDE MONOHYDRATE 18 MCG IN CAPS: 18.0000 ug | ORAL_CAPSULE | Freq: Every day | RESPIRATORY_TRACT | Status: DC

## 2014-03-16 NOTE — Telephone Encounter (Signed)
Left message on pharmacy line; called in pt's Rx for Spiriva 18 mcg capsule #30 with 6 refills per Dr. Gwendolyn Grant written today.  Rx filled today, but stated print and not normal.   Clovis Pu, RN

## 2014-03-17 NOTE — Telephone Encounter (Signed)
Rx has been sent to the pharmacy electronically. ° °

## 2014-04-07 ENCOUNTER — Other Ambulatory Visit: Payer: Self-pay | Admitting: Internal Medicine

## 2014-04-07 NOTE — Telephone Encounter (Signed)
Rx(s) sent to pharmacy electronically.  

## 2014-04-10 ENCOUNTER — Other Ambulatory Visit: Payer: Self-pay | Admitting: Family Medicine

## 2014-04-13 ENCOUNTER — Inpatient Hospital Stay (HOSPITAL_COMMUNITY): Payer: No Typology Code available for payment source

## 2014-04-13 ENCOUNTER — Inpatient Hospital Stay (HOSPITAL_COMMUNITY)
Admission: EM | Admit: 2014-04-13 | Discharge: 2014-04-17 | DRG: 897 | Disposition: A | Discharge status: Home / Self Care | Payer: No Typology Code available for payment source | Attending: Family Medicine | Admitting: Family Medicine

## 2014-04-13 ENCOUNTER — Encounter (HOSPITAL_COMMUNITY): Payer: Self-pay | Admitting: Emergency Medicine

## 2014-04-13 DIAGNOSIS — R569 Unspecified convulsions: Secondary | ICD-10-CM | POA: Diagnosis present

## 2014-04-13 DIAGNOSIS — Z7952 Long term (current) use of systemic steroids: Secondary | ICD-10-CM | POA: Diagnosis not present

## 2014-04-13 DIAGNOSIS — R059 Cough, unspecified: Secondary | ICD-10-CM | POA: Insufficient documentation

## 2014-04-13 DIAGNOSIS — I10 Essential (primary) hypertension: Secondary | ICD-10-CM | POA: Diagnosis present

## 2014-04-13 DIAGNOSIS — E872 Acidosis, unspecified: Secondary | ICD-10-CM

## 2014-04-13 DIAGNOSIS — F329 Major depressive disorder, single episode, unspecified: Secondary | ICD-10-CM | POA: Diagnosis present

## 2014-04-13 DIAGNOSIS — E871 Hypo-osmolality and hyponatremia: Secondary | ICD-10-CM | POA: Diagnosis present

## 2014-04-13 DIAGNOSIS — R0603 Acute respiratory distress: Secondary | ICD-10-CM | POA: Insufficient documentation

## 2014-04-13 DIAGNOSIS — F419 Anxiety disorder, unspecified: Secondary | ICD-10-CM | POA: Diagnosis present

## 2014-04-13 DIAGNOSIS — F17210 Nicotine dependence, cigarettes, uncomplicated: Secondary | ICD-10-CM | POA: Diagnosis present

## 2014-04-13 DIAGNOSIS — F10231 Alcohol dependence with withdrawal delirium: Principal | ICD-10-CM | POA: Diagnosis present

## 2014-04-13 DIAGNOSIS — R000 Tachycardia, unspecified: Secondary | ICD-10-CM

## 2014-04-13 DIAGNOSIS — R05 Cough: Secondary | ICD-10-CM

## 2014-04-13 DIAGNOSIS — E785 Hyperlipidemia, unspecified: Secondary | ICD-10-CM | POA: Diagnosis present

## 2014-04-13 DIAGNOSIS — F10931 Alcohol use, unspecified with withdrawal delirium: Secondary | ICD-10-CM

## 2014-04-13 DIAGNOSIS — F1010 Alcohol abuse, uncomplicated: Secondary | ICD-10-CM | POA: Diagnosis present

## 2014-04-13 DIAGNOSIS — F10939 Alcohol use, unspecified with withdrawal, unspecified: Secondary | ICD-10-CM | POA: Diagnosis present

## 2014-04-13 DIAGNOSIS — J449 Chronic obstructive pulmonary disease, unspecified: Secondary | ICD-10-CM | POA: Diagnosis present

## 2014-04-13 DIAGNOSIS — K429 Umbilical hernia without obstruction or gangrene: Secondary | ICD-10-CM | POA: Diagnosis present

## 2014-04-13 DIAGNOSIS — F10239 Alcohol dependence with withdrawal, unspecified: Secondary | ICD-10-CM | POA: Diagnosis present

## 2014-04-13 DIAGNOSIS — R0902 Hypoxemia: Secondary | ICD-10-CM

## 2014-04-13 DIAGNOSIS — I5022 Chronic systolic (congestive) heart failure: Secondary | ICD-10-CM | POA: Diagnosis present

## 2014-04-13 DIAGNOSIS — I5032 Chronic diastolic (congestive) heart failure: Secondary | ICD-10-CM | POA: Diagnosis present

## 2014-04-13 DIAGNOSIS — G629 Polyneuropathy, unspecified: Secondary | ICD-10-CM | POA: Diagnosis present

## 2014-04-13 DIAGNOSIS — Z7982 Long term (current) use of aspirin: Secondary | ICD-10-CM | POA: Diagnosis not present

## 2014-04-13 DIAGNOSIS — R0600 Dyspnea, unspecified: Secondary | ICD-10-CM

## 2014-04-13 DIAGNOSIS — R4182 Altered mental status, unspecified: Secondary | ICD-10-CM | POA: Diagnosis present

## 2014-04-13 LAB — I-STAT ARTERIAL BLOOD GAS, ED
Acid-base deficit: 2 mmol/L (ref 0.0–2.0)
Bicarbonate: 22.4 meq/L (ref 20.0–24.0)
O2 Saturation: 97 %
Patient temperature: 98.2
TCO2: 23 mmol/L (ref 0–100)
pCO2 arterial: 35.9 mmHg (ref 35.0–45.0)
pH, Arterial: 7.402 (ref 7.350–7.450)
pO2, Arterial: 90 mmHg (ref 80.0–100.0)

## 2014-04-13 LAB — CBC WITH DIFFERENTIAL/PLATELET
BASOS ABS: 0.1 10*3/uL (ref 0.0–0.1)
Basophils Relative: 0 % (ref 0–1)
Eosinophils Absolute: 0.1 10*3/uL (ref 0.0–0.7)
Eosinophils Relative: 1 % (ref 0–5)
HCT: 39.3 % (ref 39.0–52.0)
Hemoglobin: 13.9 g/dL (ref 13.0–17.0)
Lymphocytes Relative: 10 % — ABNORMAL LOW (ref 12–46)
Lymphs Abs: 1.5 10*3/uL (ref 0.7–4.0)
MCH: 31.4 pg (ref 26.0–34.0)
MCHC: 35.4 g/dL (ref 30.0–36.0)
MCV: 88.9 fL (ref 78.0–100.0)
Monocytes Absolute: 2.6 10*3/uL — ABNORMAL HIGH (ref 0.1–1.0)
Monocytes Relative: 17 % — ABNORMAL HIGH (ref 3–12)
Neutro Abs: 11.1 10*3/uL — ABNORMAL HIGH (ref 1.7–7.7)
Neutrophils Relative %: 72 % (ref 43–77)
Platelets: 448 10*3/uL — ABNORMAL HIGH (ref 150–400)
RBC: 4.42 MIL/uL (ref 4.22–5.81)
RDW: 13.3 % (ref 11.5–15.5)
WBC: 15.3 10*3/uL — ABNORMAL HIGH (ref 4.0–10.5)

## 2014-04-13 LAB — COMPREHENSIVE METABOLIC PANEL
ALK PHOS: 106 U/L (ref 39–117)
ALT: 30 U/L (ref 0–53)
AST: 59 U/L — AB (ref 0–37)
Albumin: 3.6 g/dL (ref 3.5–5.2)
Anion gap: 23 — ABNORMAL HIGH (ref 5–15)
BILIRUBIN TOTAL: 0.8 mg/dL (ref 0.3–1.2)
BUN: <5 mg/dL — ABNORMAL LOW (ref 6–23)
CALCIUM: 9.8 mg/dL (ref 8.4–10.5)
CHLORIDE: 86 mmol/L — AB (ref 96–112)
CO2: 15 mmol/L — AB (ref 19–32)
Creatinine, Ser: 1.12 mg/dL (ref 0.50–1.35)
GFR calc Af Amer: 80 mL/min — ABNORMAL LOW (ref >90)
GFR calc non Af Amer: 69 mL/min — ABNORMAL LOW (ref >90)
Glucose, Bld: 225 mg/dL — ABNORMAL HIGH (ref 70–99)
POTASSIUM: 3.5 mmol/L (ref 3.5–5.1)
Sodium: 124 mmol/L — ABNORMAL LOW (ref 135–145)
Total Protein: 6.8 g/dL (ref 6.0–8.3)

## 2014-04-13 LAB — BASIC METABOLIC PANEL WITH GFR
Anion gap: 7 (ref 5–15)
BUN: <5 mg/dL — ABNORMAL LOW (ref 6–23)
CO2: 24 mmol/L (ref 19–32)
Calcium: 7.6 mg/dL — ABNORMAL LOW (ref 8.4–10.5)
Chloride: 101 mmol/L (ref 96–112)
Creatinine, Ser: 1.01 mg/dL (ref 0.50–1.35)
GFR calc Af Amer: >90 mL/min (ref >90)
GFR calc non Af Amer: 78 mL/min — ABNORMAL LOW (ref >90)
Glucose, Bld: 110 mg/dL — ABNORMAL HIGH (ref 70–99)
Potassium: 3.7 mmol/L (ref 3.5–5.1)
Sodium: 132 mmol/L — ABNORMAL LOW (ref 135–145)

## 2014-04-13 LAB — ETHANOL

## 2014-04-13 LAB — EXPECTORATED SPUTUM ASSESSMENT W GRAM STAIN, RFLX TO RESP C

## 2014-04-13 LAB — TROPONIN I
TROPONIN I: 0.03 ng/mL (ref <0.031)
Troponin I: <0.03 ng/mL (ref <0.031)
Troponin I: <0.03 ng/mL (ref <0.031)

## 2014-04-13 LAB — LIPASE, BLOOD: LIPASE: 16 U/L (ref 11–59)

## 2014-04-13 LAB — MRSA PCR SCREENING: MRSA by PCR: NEGATIVE

## 2014-04-13 LAB — I-STAT CG4 LACTIC ACID, ED: Lactic Acid, Venous: 11.54 mmol/L (ref 0.5–2.0)

## 2014-04-13 LAB — LACTIC ACID, PLASMA: LACTIC ACID, VENOUS: 2.1 mmol/L — AB (ref 0.5–2.0)

## 2014-04-13 LAB — BRAIN NATRIURETIC PEPTIDE: B Natriuretic Peptide: 133.5 pg/mL — ABNORMAL HIGH (ref 0.0–100.0)

## 2014-04-13 MED ORDER — FOLIC ACID 5 MG/ML IJ SOLN
1.0000 mg | Freq: Every day | INTRAMUSCULAR | Status: DC
  Administered 2014-04-13 – 2014-04-14 (×2): 1 mg via INTRAVENOUS
  Filled 2014-04-13 (×2): qty 0.2

## 2014-04-13 MED ORDER — DICYCLOMINE HCL 20 MG PO TABS
20.0000 mg | ORAL_TABLET | Freq: Three times a day (TID) | ORAL | Status: DC
  Administered 2014-04-13 – 2014-04-17 (×15): 20 mg via ORAL
  Filled 2014-04-13 (×16): qty 1

## 2014-04-13 MED ORDER — THIAMINE HCL 100 MG/ML IJ SOLN: 100.0000 mg | Freq: Every day | INTRAMUSCULAR | Status: DC

## 2014-04-13 MED ORDER — TIOTROPIUM BROMIDE MONOHYDRATE 18 MCG IN CAPS
18.0000 ug | ORAL_CAPSULE | Freq: Every day | RESPIRATORY_TRACT | Status: DC
  Administered 2014-04-13: 18 ug via RESPIRATORY_TRACT
  Filled 2014-04-13: qty 5

## 2014-04-13 MED ORDER — ACETAMINOPHEN 650 MG RE SUPP: 650.0000 mg | Freq: Four times a day (QID) | RECTAL | Status: DC | PRN

## 2014-04-13 MED ORDER — CARVEDILOL 12.5 MG PO TABS
12.5000 mg | ORAL_TABLET | Freq: Two times a day (BID) | ORAL | Status: DC
  Administered 2014-04-13 – 2014-04-17 (×9): 12.5 mg via ORAL
  Filled 2014-04-13 (×12): qty 1

## 2014-04-13 MED ORDER — IPRATROPIUM-ALBUTEROL 0.5-2.5 (3) MG/3ML IN SOLN
3.0000 mL | RESPIRATORY_TRACT | Status: DC
  Administered 2014-04-13 (×3): 3 mL via RESPIRATORY_TRACT
  Filled 2014-04-13 (×4): qty 3

## 2014-04-13 MED ORDER — SODIUM CHLORIDE 0.9 % IV BOLUS (SEPSIS)
1000.0000 mL | Freq: Once | INTRAVENOUS | Status: AC
  Administered 2014-04-13: 1000 mL via INTRAVENOUS

## 2014-04-13 MED ORDER — HEPARIN SODIUM (PORCINE) 5000 UNIT/ML IJ SOLN
5000.0000 [IU] | Freq: Three times a day (TID) | INTRAMUSCULAR | Status: DC
  Administered 2014-04-13 – 2014-04-17 (×13): 5000 [IU] via SUBCUTANEOUS
  Filled 2014-04-13 (×16): qty 1

## 2014-04-13 MED ORDER — LORAZEPAM 2 MG/ML IJ SOLN
2.0000 mg | INTRAMUSCULAR | Status: DC | PRN
  Filled 2014-04-13: qty 1

## 2014-04-13 MED ORDER — LORAZEPAM 1 MG PO TABS: 0.0000 mg | ORAL_TABLET | Freq: Two times a day (BID) | ORAL | Status: DC

## 2014-04-13 MED ORDER — LORAZEPAM 2 MG/ML IJ SOLN
1.0000 mg | Freq: Once | INTRAMUSCULAR | Status: AC
  Administered 2014-04-13: 1 mg via INTRAVENOUS
  Filled 2014-04-13: qty 1

## 2014-04-13 MED ORDER — MAGNESIUM OXIDE 400 (241.3 MG) MG PO TABS
400.0000 mg | ORAL_TABLET | Freq: Every day | ORAL | Status: DC
  Administered 2014-04-13 – 2014-04-15 (×3): 400 mg via ORAL
  Filled 2014-04-13 (×3): qty 1

## 2014-04-13 MED ORDER — SPIRONOLACTONE 12.5 MG HALF TABLET
12.5000 mg | ORAL_TABLET | Freq: Every day | ORAL | Status: DC
  Administered 2014-04-13: 12.5 mg via ORAL
  Filled 2014-04-13 (×2): qty 1

## 2014-04-13 MED ORDER — IPRATROPIUM-ALBUTEROL 0.5-2.5 (3) MG/3ML IN SOLN: 3.0000 mL | RESPIRATORY_TRACT | Status: DC | PRN

## 2014-04-13 MED ORDER — POTASSIUM CHLORIDE CRYS ER 20 MEQ PO TBCR
40.0000 meq | EXTENDED_RELEASE_TABLET | Freq: Every day | ORAL | Status: DC
  Administered 2014-04-13 – 2014-04-17 (×5): 40 meq via ORAL
  Filled 2014-04-13 (×5): qty 2

## 2014-04-13 MED ORDER — THIAMINE HCL 100 MG/ML IJ SOLN
100.0000 mg | Freq: Every day | INTRAMUSCULAR | Status: DC
  Administered 2014-04-13 – 2014-04-14 (×2): 100 mg via INTRAVENOUS
  Filled 2014-04-13: qty 2
  Filled 2014-04-13: qty 1

## 2014-04-13 MED ORDER — ONDANSETRON HCL 4 MG/2ML IJ SOLN: 4.0000 mg | Freq: Four times a day (QID) | INTRAMUSCULAR | Status: DC | PRN

## 2014-04-13 MED ORDER — ATORVASTATIN CALCIUM 40 MG PO TABS
40.0000 mg | ORAL_TABLET | Freq: Every day | ORAL | Status: DC
  Administered 2014-04-14 – 2014-04-17 (×4): 40 mg via ORAL
  Filled 2014-04-13 (×6): qty 1

## 2014-04-13 MED ORDER — MONTELUKAST SODIUM 10 MG PO TABS
10.0000 mg | ORAL_TABLET | Freq: Every day | ORAL | Status: DC
  Administered 2014-04-13 – 2014-04-16 (×4): 10 mg via ORAL
  Filled 2014-04-13 (×6): qty 1

## 2014-04-13 MED ORDER — VITAMIN B-1 100 MG PO TABS: 100.0000 mg | ORAL_TABLET | Freq: Every day | ORAL | Status: DC

## 2014-04-13 MED ORDER — SODIUM CHLORIDE 0.45 % IV BOLUS
1000.0000 mL | Freq: Once | INTRAVENOUS | Status: AC
  Administered 2014-04-13: 1000 mL via INTRAVENOUS

## 2014-04-13 MED ORDER — IOHEXOL 350 MG/ML SOLN
100.0000 mL | Freq: Once | INTRAVENOUS | Status: AC | PRN
  Administered 2014-04-13: 90 mL via INTRAVENOUS

## 2014-04-13 MED ORDER — THIAMINE HCL 100 MG/ML IJ SOLN
Freq: Once | INTRAVENOUS | Status: AC
  Administered 2014-04-13: 07:00:00 via INTRAVENOUS
  Filled 2014-04-13: qty 1000

## 2014-04-13 MED ORDER — LORAZEPAM 2 MG/ML IJ SOLN
0.0000 mg | Freq: Two times a day (BID) | INTRAMUSCULAR | Status: DC
  Administered 2014-04-13: 1 mg via INTRAVENOUS
  Filled 2014-04-13: qty 1

## 2014-04-13 MED ORDER — DULOXETINE HCL 30 MG PO CPEP
30.0000 mg | ORAL_CAPSULE | Freq: Every day | ORAL | Status: DC
  Administered 2014-04-13 – 2014-04-17 (×5): 30 mg via ORAL
  Filled 2014-04-13 (×5): qty 1

## 2014-04-13 MED ORDER — FUROSEMIDE 40 MG PO TABS
40.0000 mg | ORAL_TABLET | Freq: Every day | ORAL | Status: DC
  Administered 2014-04-13: 40 mg via ORAL
  Filled 2014-04-13: qty 2

## 2014-04-13 MED ORDER — ASPIRIN EC 81 MG PO TBEC
81.0000 mg | DELAYED_RELEASE_TABLET | Freq: Every day | ORAL | Status: DC
  Administered 2014-04-13 – 2014-04-17 (×5): 81 mg via ORAL
  Filled 2014-04-13 (×5): qty 1

## 2014-04-13 MED ORDER — LISINOPRIL 20 MG PO TABS
20.0000 mg | ORAL_TABLET | Freq: Every day | ORAL | Status: DC
  Administered 2014-04-13: 20 mg via ORAL
  Filled 2014-04-13 (×2): qty 1

## 2014-04-13 MED ORDER — LORAZEPAM 2 MG/ML IJ SOLN: 0.0000 mg | Freq: Four times a day (QID) | INTRAMUSCULAR | Status: DC

## 2014-04-13 MED ORDER — SODIUM CHLORIDE 0.9 % IJ SOLN
3.0000 mL | Freq: Two times a day (BID) | INTRAMUSCULAR | Status: DC
  Administered 2014-04-13 – 2014-04-16 (×8): 3 mL via INTRAVENOUS
  Filled 2014-04-13: qty 3

## 2014-04-13 MED ORDER — ONDANSETRON HCL 4 MG PO TABS: 4.0000 mg | ORAL_TABLET | Freq: Four times a day (QID) | ORAL | Status: DC | PRN

## 2014-04-13 MED ORDER — ACETAMINOPHEN 325 MG PO TABS
650.0000 mg | ORAL_TABLET | Freq: Four times a day (QID) | ORAL | Status: DC | PRN
  Administered 2014-04-13 – 2014-04-15 (×4): 650 mg via ORAL
  Filled 2014-04-13 (×4): qty 2

## 2014-04-13 MED ORDER — GABAPENTIN 300 MG PO CAPS
300.0000 mg | ORAL_CAPSULE | Freq: Three times a day (TID) | ORAL | Status: DC
  Administered 2014-04-13 – 2014-04-17 (×13): 300 mg via ORAL
  Filled 2014-04-13 (×15): qty 1

## 2014-04-13 MED ORDER — LORAZEPAM 1 MG PO TABS: 0.0000 mg | ORAL_TABLET | Freq: Four times a day (QID) | ORAL | Status: DC

## 2014-04-13 MED ORDER — SODIUM CHLORIDE 0.9 % IV SOLN
INTRAVENOUS | Status: DC
  Administered 2014-04-13: 07:00:00 via INTRAVENOUS

## 2014-04-13 NOTE — ED Provider Notes (Signed)
CSN: [id number]     Arrival date & time 04/13/14  0137 History  This chart was scribed for Marisa Severin, MD by Bronson Curb, ED Scribe. This patient was seen in room B18C/B18C and the patient's care was started at 1:44 AM.   Chief Complaint  Patient presents with  . Seizures  . Tachycardia    The history is provided by the patient. No language interpreter was used.     HPI Comments: Juan [name] is a 64 y.o. male, with history of CHF, alcoholism, anxiety, depression, HLD, HTN, and seizures, who presents to the Emergency Department for possible seizure that occurred PTA. Per EMS, patient is from home where he suffered a seizure and post ictal upon their arrival with heartrate in the 140s. EMS reports the patient had 2 seizures, related to EtOH abuse, in the past and is noncompliant with his medications. Patient denies recent EtOH withdrawals or EtOH withdrawal seizures. Patient denies any pain at present, and states he is "starting to feel a little bit better". He states he typically drinks Bourbon, and notes it takes him 3 days to finish a "big bottle".  Patient is unsure of his last drink.  He seems confused.   Past Medical History  Diagnosis Date  . CHF (congestive heart failure) 10/2010    ECHO:  EF 40%, Grade II diastolic dysfunction  . Alcoholism   . Allergy     seasonal   . Anxiety   . Depression   . Asthma   . Hyperlipidemia   . Hypertension   . Seizures    Past Surgical History  Procedure Laterality Date  . Removal of ingrown toenail     Family History  Problem Relation Age of Onset  . Alzheimer's disease Mother   . Diabetes Mother   . Heart disease Maternal Uncle    History  Substance Use Topics  . Smoking status: Current Every Day Smoker -- 1.25 packs/day for 40 years    Types: Cigarettes  . Smokeless tobacco: Never Used     Comment: a little more than 1 ppd (02/17/14)  . Alcohol Use: 0.0 oz/week    0 Standard drinks or equivalent per week     Comment:  4-5 burbon's a day for the past 15 years - 02/17/13 (enough alcohol to maintain not having seizures)    Review of Systems  Neurological: Positive for seizures.  All other systems reviewed and are negative.     Allergies  Review of patient's allergies indicates no known allergies.  Home Medications   Prior to Admission medications   Medication Sig Start Date End Date Taking? Authorizing Provider  acetaminophen (TYLENOL) 500 MG tablet Take 1,500 mg by mouth every 6 (six) hours as needed for headache (headache).     Historical Provider, MD  aspirin EC 81 MG EC tablet Take 1 tablet (81 mg total) by mouth daily. 11/13/12   Renae Fickle, MD  atorvastatin (LIPITOR) 40 MG tablet Take 1 tablet (40 mg total) by mouth daily at 6 PM. 03/17/14   Chrystie Nose, MD  B-Complex CAPS Take 1 capsule by mouth daily.    Historical Provider, MD  carvedilol (COREG) 12.5 MG tablet Take 1 tablet (12.5 mg total) by mouth 2 (two) times daily with a meal. 02/17/14   Chrystie Nose, MD  clonazePAM (KLONOPIN) 0.5 MG tablet Take 1 tablet (0.5 mg total) by mouth 2 (two) times daily as needed for anxiety. 03/04/14   Tobey Grim, MD  COMBIVENT RESPIMAT 20-100 MCG/ACT AERS respimat INHALE TWO PUFFS BY MOUTH EVERY FOUR HOURS AS NEEDED for wheezing  04/11/14   Tobey Grim, MD  dicyclomine (BENTYL) 20 MG tablet Take 1 tablet (20 mg total) by mouth 3 (three) times daily before meals. 12/01/13   Pincus Large, DO  doxycycline (VIBRA-TABS) 100 MG tablet Take 1 tablet (100 mg total) by mouth 2 (two) times daily. 03/04/14   Tobey Grim, MD  DULoxetine (CYMBALTA) 30 MG capsule Take 1 capsule (30 mg total) by mouth daily. 12/14/13   Tobey Grim, MD  fluticasone Aleda Grana) 50 MCG/ACT nasal spray Use two sprays in each nostril daily 11/08/13   Tobey Grim, MD  furosemide (LASIX) 40 MG tablet Take 1 tablet (40 mg total) by mouth daily. 03/17/14   Chrystie Nose, MD  gabapentin (NEURONTIN) 300 MG capsule TAKE ONE  CAPSULE BY MOUTH THREE TIMES DAILY  03/17/14   Tobey Grim, MD  lisinopril (PRINIVIL,ZESTRIL) 20 MG tablet Take 1 tablet (20 mg total) by mouth daily. 04/07/14   Chrystie Nose, MD  Magnesium 500 MG TABS Take 500 mg by mouth daily.    Historical Provider, MD  montelukast (SINGULAIR) 10 MG tablet Take 1 tablet (10 mg total) by mouth at bedtime. 10/26/13   Tobey Grim, MD  Multiple Vitamin (MULTIVITAMIN WITH MINERALS) TABS tablet Take 1 tablet by mouth daily.    Historical Provider, MD  mupirocin ointment (BACTROBAN) 2 % Place 1 application into the nose 2 (two) times daily. 01/27/14   Ashly Hulen Skains, DO  potassium chloride SA (K-DUR,KLOR-CON) 20 MEQ tablet Take 2 tablets (40 mEq total) by mouth daily. 03/17/14   Chrystie Nose, MD  predniSONE (DELTASONE) 20 MG tablet Take 2 tablets (40 mg total) by mouth daily with breakfast. X 5 days 03/04/14   Tobey Grim, MD  spironolactone (ALDACTONE) 25 MG tablet TAKE HALF TABLET BY MOUTH DAILY. needs appointment for further refills. 01/13/14   Chrystie Nose, MD  spironolactone (ALDACTONE) 25 MG tablet TAKE HALF TABLET BY MOUTH DAILY  03/17/14   Chrystie Nose, MD  tiotropium (SPIRIVA) 18 MCG inhalation capsule Place 1 capsule (18 mcg total) into inhaler and inhale daily. 03/16/14   Tobey Grim, MD  triamcinolone (KENALOG) 0.025 % ointment Apply 1 application topically 2 (two) times daily. Patient taking differently: Apply 1 application topically 2 (two) times daily as needed (for rash).  10/26/13   Tobey Grim, MD  Vitamin D, Ergocalciferol, (DRISDOL) 50000 UNITS CAPS capsule Take 1 capsule (50,000 Units total) by mouth every 7 (seven) days. 01/28/14   Raliegh Ip, DO   Triage Vitals: BP 150/86 mmHg  Pulse 139  Temp(Src) 98.2 F (36.8 C) (Oral)  Resp 34  SpO2 95%  Physical Exam  Constitutional: He appears well-developed. He has a sickly appearance. No distress.  HENT:  Head: Normocephalic and atraumatic.  Nose: Nose  normal.  Mouth/Throat: Oropharynx is clear and moist. No oropharyngeal exudate.  No oral trauma.  Eyes: Conjunctivae and EOM are normal.  Neck: Neck supple. No tracheal deviation present.  Cardiovascular: Regular rhythm, normal heart sounds and intact distal pulses.  Tachycardia present.  Exam reveals no gallop and no friction rub.   No murmur heard. Pulmonary/Chest: Effort normal. No respiratory distress.  Musculoskeletal: Normal range of motion. He exhibits no edema or tenderness.  Neurological: He is alert. Coordination abnormal.  Tremulous, confused, oriented to self  Skin: Skin is warm and  dry. No rash noted. No erythema. No pallor.  Psychiatric:  Confused.  Nursing note and vitals reviewed.   ED Course  Procedures (including critical care time)  DIAGNOSTIC STUDIES: Oxygen Saturation is 95% on 2L N/C, adequate by my interpretation.    COORDINATION OF CARE: At 0149 Discussed treatment plan with patient. Patient agrees.   Labs Review Labs Reviewed  COMPREHENSIVE METABOLIC PANEL - Abnormal; Notable for the following:    Sodium 124 (*)    Chloride 86 (*)    CO2 15 (*)    Glucose, Bld 225 (*)    BUN <5 (*)    AST 59 (*)    GFR calc non Af Amer 69 (*)    GFR calc Af Amer 80 (*)    Anion gap 23 (*)    All other components within normal limits  CBC WITH DIFFERENTIAL/PLATELET - Abnormal; Notable for the following:    WBC 15.3 (*)    Platelets 448 (*)    Neutro Abs 11.1 (*)    Lymphocytes Relative 10 (*)    Monocytes Relative 17 (*)    Monocytes Absolute 2.6 (*)    All other components within normal limits  I-STAT CG4 LACTIC ACID, ED - Abnormal; Notable for the following:    Lactic Acid, Venous 11.54 (*)    All other components within normal limits  CULTURE, BLOOD (ROUTINE X 2)  CULTURE, BLOOD (ROUTINE X 2)  CULTURE, EXPECTORATED SPUTUM-ASSESSMENT  LIPASE, BLOOD  ETHANOL  TROPONIN I  BLOOD GAS, ARTERIAL  BRAIN NATRIURETIC PEPTIDE  TROPONIN I  TROPONIN I   TROPONIN I  BASIC METABOLIC PANEL  I-STAT ARTERIAL BLOOD GAS, ED    Imaging Review Dg Chest 2 View  04/13/2014   CLINICAL DATA:  Cough, tachycardia, hypoxia.  Symptoms for 1 day.  EXAM: CHEST  2 VIEW  COMPARISON:  02/12/2014  FINDINGS: The lungs are hyperinflated. There is cardiomegaly, this appears new/progressed from prior. Ill-defined opacity at the lung bases likely atelectasis, lung base evaluation limited by overlying artifact and soft tissue attenuation. No confluent airspace disease. Pulmonary edema. No pleural effusion or pneumothorax. There is exaggerated thoracic kyphosis and multilevel degenerative change throughout spine.  IMPRESSION: 1. Mild cardiomegaly, new/progressed from prior exam. 2. Hyperinflation.  Probable bibasilar atelectasis.   Electronically Signed   By: Rubye Oaks M.D.   On: 04/13/2014 05:18     EKG Interpretation   Date/Time:  Wednesday April 13 2014 01:46:32 EST Ventricular Rate:  140 PR Interval:  90 QRS Duration: 138 QT Interval:  355 QTC Calculation: 542 R Axis:   -119 Text Interpretation:  Sinus or ectopic atrial tachycardia RBBB and LAFB  Minimal ST elevation, lateral leads No significant change since last  tracing Confirmed by Jonhatan Hearty  MD, Terianne Thaker ([id number]) on 04/13/2014 1:55:49 AM     CRITICAL CARE Performed by: Olivia Mackie Total critical care time:60 min Critical care time was exclusive of separately billable procedures and treating other patients. Critical care was necessary to treat or prevent imminent or life-threatening deterioration. Critical care was time spent personally by me on the following activities: development of treatment plan with patient and/or surrogate as well as nursing, discussions with consultants, evaluation of patient's response to treatment, examination of patient, obtaining history from patient or surrogate, ordering and performing treatments and interventions, ordering and review of laboratory studies, ordering and review  of radiographic studies, pulse oximetry and re-evaluation of patient's condition.  MDM   Final diagnoses:  Alcohol withdrawal seizure, with delirium  Lactic acidosis  Hyponatremia   64 year old male with seizure and altered mental status.  Patient appears to be in acute alcohol withdrawal with tachycardia, altered mental status.  History of alcoholism.  He is unsure of his last drink.  In speaking with his wife, she is unsure when he last drank, but there is alcohol, available in the house.  Alcohol today is noted to be less than 5.  Patient placed on see well.  He has received fluid boluses to help with his lactic acidosis.  Case discussed with family practice will admit to the stepdown unit.     I personally performed the services described in this documentation, which was scribed in my presence. The recorded information has been reviewed and is accurate.     Marisa Severin, MD 04/13/14 [phone]

## 2014-04-13 NOTE — ED Notes (Signed)
EMS reports pt had a seizure at home and was post ictal on their arrival. Pt was tachy 140s on EMS arrival to scene. Pt a/o x 4 on arrival to ED with HR 130s. Pt denies any pain on arrival to ED but states he can feel his heart racing. Pt has hx of seizures related to etoh abuse per EMS.

## 2014-04-13 NOTE — H&P (Signed)
Family Medicine Teaching [facility] Admission History and Physical Service Pager: [phone]  Patient name: Juan [name] Medical record number: [id number] Date of birth: [birth year] Age: 64 y.o. Gender: male  Primary Care Provider: Renold Don, MD Consultants: None Code Status: Full  Chief Complaint: Seizure  Assessment and Plan: Juan [name] is a 64 y.o. male presenting with witnessed seizure and increased WOB. PMH is significant for alcoholism and alcoholism related seizures, CHF, anxiety, depression, HLD, HTN, asthma.  #Seizure, in the setting of Alcohol abuse: Does not know when last drink was. "Could have been 2-3 days ago". Neg for alcohol in ED. Slightly diminished mentation but oriented x3. Tachycardic. Denies HA, CP, dizziness, vision changes, n/v/d/c. - Admit to inpatient. Attending Dr. Deirdre Priest - SDU CIWA protocol and monitor vitals. - Telemetry - SW c/s for substance abuse and to help find treatment program he will go to. - Daily PPI for GI ppx - Banana bag @125ml /hr; Thiamine and folic acid replacement - Lactic acid 11.5  #Hyponatremia: Na 124 on admission, seems to be chronic issue with Na recently 124 -127. Likely related to beer potomania in setting of chronic alcoholism  - Nutrition consult - GOAL: correct at rate of <0.5 meq/L/hr (due to chronic nature) - reassess Na @1200  3/9 with BMP - Heart healthy diet.  #Tachypnea w/ hx of COPD and aspiration PNA: lactic acid 11.5 - Doesn't appear to be having an exacerbation. CXR unremarkable.  - Hx of aspiration pneumonia.Would need to cover for aspiration if initiating ABX.  -Continue home Combivent, Spiriva, singulair - BNP - pending - sputum cultures ordered - Blood cultures ordered - ABG: wnl - CTA Chest for PE r/o  #Diastolic CHF: echo 09/2013 EF 70-01%, grade 1 diastolic dysfunction. -Continue home lasix, spironolactone, K, aspirin -strict I's and O's - cycle troponins  #HTN: BP mildly  hypertensive while in the ED.Poor med compliance at home. -Continue home Cleda Daub and Lisinopril, Lasix, Coreg  #Peripheral Neuropathy: -Continue home Gabapentin  #Anxiety/depression:  -Continue Cymbalta - Holding home Klonopin, Ativan - on CIWA protocol  #Tobacco abuse: 60 pack year hx.  - smoking cessation counseling  #Hx of Umbilical Hernia - No tenderness on exam  FEN/GI: banana bag @125ml /hr; heart healthy diet Prophylaxis: SQ Hep  Disposition: admit to stepdown  History of Present Illness: Juan [name] is a 64 y.o. male presenting with a witnessed seizure by his wife. PMH is significant for alcoholism and alcoholism related seizures, CHF, anxiety, depression, HLD, HTN, asthma. He is generally a very poor historian and his wife is able to provide few details, as well. Wife reports he had a seizure at home early this morning around 1:30 AM. Wife reports he was sitting on the sofa and he began speaking unintelligibly, then began having generalized full-body convulsions for about 1 minute. When the seizure stopped, he was very confused and did not respond well, and EMS was called to transport him to the ED. Pt and wife report he has had "poor breathing" for about 2 weeks, with cough productive of some colored sputum / phlegm (though productive cough has been present for "quite a while" and he has had breathing issues for "years," though has never been diagnosed with COPD). It is unclear whether he has had fever / chills, N/V. He cannot report when he last drank alcohol; per his wife, he typically "drinks around the clock," but she is not sure when his last drink was. Per his wife, he has been out of medications for a  couple of weeks and prior to that it was "spotty" that he took his medications as he was supposed to. He is a current smoker, 1-1.5 packs per day.  In the ED, he was found to have an EtOH level of <5. He was given Ativan and IVF (bolus x 2L).   Review Of Systems: Per HPI;  ROS limited by pt's mental status / waxing and waning confusion. Otherwise 12 point review of systems was performed and was unremarkable.  Patient Active Problem List   Diagnosis Date Noted  . Alcohol withdrawal seizure 04/13/2014  . Hypoxia   . Respiratory distress   . Bronchiectasis with acute exacerbation 03/04/2014  . Tachycardia 02/12/2014  . Hypokalemia 01/26/2014  . Alcohol withdrawal 01/25/2014  . SOB (shortness of breath)   . Chronic diastolic heart failure   . Intractable nausea and vomiting 01/23/2014  . Peripheral neuropathy 12/15/2013  . Diarrhea 11/28/2013  . ETOH abuse 11/18/2013  . Bronchiectasis without acute exacerbation 06/30/2013  . Lung nodule seen on imaging study 06/15/2013  . Chronic systolic heart failure 06/13/2013  . Transaminitis 06/13/2013  . RBBB 11/09/2012  . Hyponatremia 11/08/2012  . Seizure due to alcohol withdrawal 11/08/2012  . Umbilical hernia 08/06/2012  . Smoker unmotivated to quit 07/22/2012  . Tremor 07/30/2011  . HTN (hypertension) 07/30/2011  . Hepatomegaly 11/05/2010  . Depression 11/05/2010  . Anxiety 11/05/2010   Past Medical History: Past Medical History  Diagnosis Date  . CHF (congestive heart failure) 10/2010    ECHO:  EF 40%, Grade II diastolic dysfunction  . Alcoholism   . Allergy     seasonal   . Anxiety   . Depression   . Asthma   . Hyperlipidemia   . Hypertension   . Seizures    Past Surgical History: Past Surgical History  Procedure Laterality Date  . Removal of ingrown toenail     Social History: History  Substance Use Topics  . Smoking status: Current Every Day Smoker -- 1.25 packs/day for 40 years    Types: Cigarettes  . Smokeless tobacco: Never Used     Comment: a little more than 1 ppd (02/17/14)  . Alcohol Use: 0.0 oz/week    0 Standard drinks or equivalent per week     Comment: 4-5 burbon's a day for the past 15 years - 02/17/13 (enough alcohol to maintain not having seizures)   Additional social  history: none  Please also refer to relevant sections of EMR.  Family History: Family History  Problem Relation Age of Onset  . Alzheimer's disease Mother   . Diabetes Mother   . Heart disease Maternal Uncle    Allergies and Medications: No Known Allergies No current facility-administered medications on file prior to encounter.   Current Outpatient Prescriptions on File Prior to Encounter  Medication Sig Dispense Refill  . acetaminophen (TYLENOL) 500 MG tablet Take 1,500 mg by mouth every 6 (six) hours as needed for headache (headache).     Marland Kitchen aspirin EC 81 MG EC tablet Take 1 tablet (81 mg total) by mouth daily. 90 tablet 0  . atorvastatin (LIPITOR) 40 MG tablet Take 1 tablet (40 mg total) by mouth daily at 6 PM. 30 tablet 6  . B-Complex CAPS Take 1 capsule by mouth daily.    . carvedilol (COREG) 12.5 MG tablet Take 1 tablet (12.5 mg total) by mouth 2 (two) times daily with a meal. 60 tablet 6  . clonazePAM (KLONOPIN) 0.5 MG tablet Take 1 tablet (0.5  mg total) by mouth 2 (two) times daily as needed for anxiety. 60 tablet 1  . COMBIVENT RESPIMAT 20-100 MCG/ACT AERS respimat INHALE TWO PUFFS BY MOUTH EVERY FOUR HOURS AS NEEDED for wheezing  4 g 1  . dicyclomine (BENTYL) 20 MG tablet Take 1 tablet (20 mg total) by mouth 3 (three) times daily before meals. 90 tablet 0  . DULoxetine (CYMBALTA) 30 MG capsule Take 1 capsule (30 mg total) by mouth daily. 30 capsule 3  . fluticasone (FLONASE) 50 MCG/ACT nasal spray Use two sprays in each nostril daily 16 g 11  . furosemide (LASIX) 40 MG tablet Take 1 tablet (40 mg total) by mouth daily. 30 tablet 6  . gabapentin (NEURONTIN) 300 MG capsule TAKE ONE CAPSULE BY MOUTH THREE TIMES DAILY  90 capsule 6  . lisinopril (PRINIVIL,ZESTRIL) 20 MG tablet Take 1 tablet (20 mg total) by mouth daily. 30 tablet 10  . Magnesium 500 MG TABS Take 500 mg by mouth daily.    . montelukast (SINGULAIR) 10 MG tablet Take 1 tablet (10 mg total) by mouth at bedtime. 30  tablet 3  . Multiple Vitamin (MULTIVITAMIN WITH MINERALS) TABS tablet Take 1 tablet by mouth daily.    . mupirocin ointment (BACTROBAN) 2 % Place 1 application into the nose 2 (two) times daily. 22 g 0  . potassium chloride SA (K-DUR,KLOR-CON) 20 MEQ tablet Take 2 tablets (40 mEq total) by mouth daily. 60 tablet 6  . spironolactone (ALDACTONE) 25 MG tablet TAKE HALF TABLET BY MOUTH DAILY. needs appointment for further refills. 15 tablet 0  . spironolactone (ALDACTONE) 25 MG tablet TAKE HALF TABLET BY MOUTH DAILY  15 tablet 5  . tiotropium (SPIRIVA) 18 MCG inhalation capsule Place 1 capsule (18 mcg total) into inhaler and inhale daily. 30 capsule 6  . triamcinolone (KENALOG) 0.025 % ointment Apply 1 application topically 2 (two) times daily. (Patient taking differently: Apply 1 application topically 2 (two) times daily as needed (for rash). ) 30 g 0  . Vitamin D, Ergocalciferol, (DRISDOL) 50000 UNITS CAPS capsule Take 1 capsule (50,000 Units total) by mouth every 7 (seven) days. 6 capsule 0  . [DISCONTINUED] albuterol (PROVENTIL,VENTOLIN) 90 MCG/ACT inhaler Inhale 2 puffs into the lungs every 6 (six) hours as needed for wheezing. 17 g 0    Objective: BP 142/70 mmHg  Pulse 145  Temp(Src) 98.2 F (36.8 C) (Oral)  Resp 19  SpO2 96% Exam: General -- oriented x3, cooperative. Difficult to understand. HEENT -- Head is normocephalic. EOMI. Ears, nose and throat were benign. Integument -- intact. No rash, erythema, or ecchymoses.  Chest -- poor expansion. Diffuse crackles throughout. Cardiac -- tachycardic. No murmurs noted, although difficult to distinguish 2/2 breath sounds Abdomen -- obese, soft, nontender. Umbilical hernia appreciated. Bowel sounds present. CNS -- cranial nerves II through XII grossly intact. Extremeties - no tenderness or effusions noted. +1 LE edema. ROM good. 5/5 bilateral strength. Dorsalis pedis pulses present and symmetrical.   Labs and Imaging: CBC BMET   Recent  Labs Lab 04/13/14 0153  WBC 15.3*  HGB 13.9  HCT 39.3  PLT 448*    Recent Labs Lab 04/13/14 0153  NA 124*  K 3.5  CL 86*  CO2 15*  BUN <5*  CREATININE 1.12  GLUCOSE 225*  CALCIUM 9.8     Lactic Acid (POC) 11.5 ABG - wnl  Kathee Delton, MD 04/13/2014, 6:08 AM PGY-1, [facility] Family [facility] FPTS Intern pager: [phone], text pages welcome  FPTS  Upper-Level Resident Addendum  I have independently interviewed and examined the patient. I have discussed the above with Dr. Wende Mott and agree with his documentation as above. The above reflects his original note, which I have reviewed and edited as appropriate.  Bobbye Morton, MD PGY-3, [facility] Family Medicine FPTS Service pager: [phone] (text pages welcome through [facility])

## 2014-04-13 NOTE — ED Notes (Signed)
Pt up to bedside commode to have a bm.

## 2014-04-13 NOTE — Progress Notes (Signed)
Clinical Social Work Department BRIEF PSYCHOSOCIAL ASSESSMENT 04/13/2014  Patient:  Juan [name], Juan [name]     Account Number:  [id number]     Admit date:  04/13/2014  Clinical Social Worker:  Andres Shad  Date/Time:  04/13/2014 02:39 PM  Referred by:  Physician  Date Referred:  04/13/2014 Referred for  Substance Abuse   Other Referral:   Interview type:  Patient Other interview type:    PSYCHOSOCIAL DATA Living Status:  WIFE Admitted from facility:   Level of care:  Independent Living Primary support name:  Wife: [name] Primary support relationship to patient:  FAMILY Degree of support available:   High level of support per patient.    CURRENT CONCERNS Current Concerns  Substance Abuse   Other Concerns:    SOCIAL WORK ASSESSMENT / PLAN LCSW recieved consult regarding substance abuse.  patient resting in room when LCSW came to visit. Reports his detox is going well, been sleeping a lot, reports no tremors, agaitaiton, headaches, but is observed having trouble keeping his eyes open.  Patient of where he is and disposition to be admitted to the hospital. LCSW explained role and reason for consult. patient reports at this time he is not interested in treatment, if anything he may consider outpatient.  Patient reports he lives with wife and 4 kittens.  Reports he will drink Bourbon daily, usually has the ability to finish bottle in 3-5 days.  Patient again offered outpatient services in which he politely declined.  LCSW asked to follow up in the hospital and offer services in which he was agreeable.  LCSW will transfer case to unit LCSW to follow up if patient changes his mind about services.  At this time he denied the SBIRT due to not remembering his last drink and reporting feeling tired.   Assessment/plan status:  Psychosocial Support/Ongoing Assessment of Needs Other assessment/ plan:   Needs SBIRT if patient agreeable   Information/referral to community resources:    outpatient SA resources    PATIENT'S/FAMILY'S RESPONSE TO PLAN OF CARE: Patient very polite and cooperative during assessment. Reporting feeling tired, but not going through withdrawls at this time. Patient appreciative of outpatient and inpatient information and possible referrals, but kindly declines. Reports LCSW can follow up later, but at this time he just wants to go home, feels etoh is not a problem that he has been drinking for many years.    LCSW reviewed patient chart in which patient has been offered SA services in the past specifically inpatient: [name] and [name]. Patient has not been agreeable per [name] CSW with family medicine.  Patient cannot go to [name] due to medical acuity per [name]. Again patient not agreeable at this time, but would benefit from CDIOP and inpatient if patient is agreeable.    [name], MSW Clinical Social Work: Emergency Room [phone]

## 2014-04-13 NOTE — Progress Notes (Signed)
Patient arrived to 2C02 via stretcher. Patient denies any pain or discomfort at this time. Wife at bedside.

## 2014-04-14 LAB — COMPREHENSIVE METABOLIC PANEL
ALT: 20 U/L (ref 0–53)
AST: 37 U/L (ref 0–37)
Albumin: 2.7 g/dL — ABNORMAL LOW (ref 3.5–5.2)
Alkaline Phosphatase: 89 U/L (ref 39–117)
Anion gap: 3 — ABNORMAL LOW (ref 5–15)
BUN: 6 mg/dL (ref 6–23)
CALCIUM: 7.3 mg/dL — AB (ref 8.4–10.5)
CHLORIDE: 103 mmol/L (ref 96–112)
CO2: 24 mmol/L (ref 19–32)
CREATININE: 0.93 mg/dL (ref 0.50–1.35)
GFR, EST NON AFRICAN AMERICAN: 88 mL/min — AB (ref >90)
Glucose, Bld: 98 mg/dL (ref 70–99)
POTASSIUM: 3.6 mmol/L (ref 3.5–5.1)
SODIUM: 130 mmol/L — AB (ref 135–145)
TOTAL PROTEIN: 5.1 g/dL — AB (ref 6.0–8.3)
Total Bilirubin: 0.9 mg/dL (ref 0.3–1.2)

## 2014-04-14 LAB — CBC
HCT: 31 % — ABNORMAL LOW (ref 39.0–52.0)
HEMOGLOBIN: 10.5 g/dL — AB (ref 13.0–17.0)
MCH: 31.5 pg (ref 26.0–34.0)
MCHC: 33.9 g/dL (ref 30.0–36.0)
MCV: 93.1 fL (ref 78.0–100.0)
PLATELETS: 292 10*3/uL (ref 150–400)
RBC: 3.33 MIL/uL — ABNORMAL LOW (ref 4.22–5.81)
RDW: 13.7 % (ref 11.5–15.5)
WBC: 8.7 10*3/uL (ref 4.0–10.5)

## 2014-04-14 MED ORDER — LISINOPRIL 10 MG PO TABS
10.0000 mg | ORAL_TABLET | Freq: Every day | ORAL | Status: DC
  Administered 2014-04-14 – 2014-04-17 (×4): 10 mg via ORAL
  Filled 2014-04-14 (×4): qty 1

## 2014-04-14 MED ORDER — IPRATROPIUM-ALBUTEROL 0.5-2.5 (3) MG/3ML IN SOLN
3.0000 mL | Freq: Two times a day (BID) | RESPIRATORY_TRACT | Status: DC
  Administered 2014-04-14 – 2014-04-15 (×3): 3 mL via RESPIRATORY_TRACT
  Filled 2014-04-14 (×3): qty 3

## 2014-04-14 MED ORDER — IPRATROPIUM-ALBUTEROL 0.5-2.5 (3) MG/3ML IN SOLN: 3.0000 mL | Freq: Two times a day (BID) | RESPIRATORY_TRACT | Status: DC

## 2014-04-14 MED ORDER — FOLIC ACID 1 MG PO TABS
1.0000 mg | ORAL_TABLET | Freq: Every day | ORAL | Status: DC
  Administered 2014-04-15 – 2014-04-17 (×3): 1 mg via ORAL
  Filled 2014-04-14 (×3): qty 1

## 2014-04-14 MED ORDER — SALINE SPRAY 0.65 % NA SOLN
1.0000 | NASAL | Status: DC | PRN
  Administered 2014-04-14: 1 via NASAL
  Filled 2014-04-14 (×2): qty 44

## 2014-04-14 MED ORDER — CETYLPYRIDINIUM CHLORIDE 0.05 % MT LIQD
7.0000 mL | Freq: Two times a day (BID) | OROMUCOSAL | Status: DC
  Administered 2014-04-14 – 2014-04-16 (×3): 7 mL via OROMUCOSAL

## 2014-04-14 MED ORDER — VITAMIN B-1 100 MG PO TABS
100.0000 mg | ORAL_TABLET | Freq: Every day | ORAL | Status: DC
  Administered 2014-04-15 – 2014-04-17 (×3): 100 mg via ORAL
  Filled 2014-04-14 (×3): qty 1

## 2014-04-14 MED ORDER — SODIUM CHLORIDE 0.45 % IV BOLUS
500.0000 mL | Freq: Once | INTRAVENOUS | Status: AC
  Administered 2014-04-14: 500 mL via INTRAVENOUS

## 2014-04-14 MED ORDER — FLUTICASONE PROPIONATE 50 MCG/ACT NA SUSP
2.0000 | Freq: Two times a day (BID) | NASAL | Status: DC
  Administered 2014-04-14: 2 via NASAL
  Filled 2014-04-14: qty 16

## 2014-04-14 MED ORDER — FLUTICASONE PROPIONATE 50 MCG/ACT NA SUSP
1.0000 | Freq: Every day | NASAL | Status: DC
  Filled 2014-04-14: qty 16

## 2014-04-14 MED ORDER — ALBUTEROL SULFATE (2.5 MG/3ML) 0.083% IN NEBU: 2.5000 mg | INHALATION_SOLUTION | RESPIRATORY_TRACT | Status: DC | PRN

## 2014-04-14 MED ORDER — FLUTICASONE PROPIONATE 50 MCG/ACT NA SUSP
1.0000 | Freq: Two times a day (BID) | NASAL | Status: DC
  Administered 2014-04-15 – 2014-04-17 (×5): 1 via NASAL
  Filled 2014-04-14: qty 16

## 2014-04-14 NOTE — Progress Notes (Signed)
Notified Dr. Wende Mott of pt SBP still in the 80's and low 90's after first bolus. New orders given.

## 2014-04-14 NOTE — Progress Notes (Signed)
Notified Dr. Wende Mott of pt  BP 81/42. New orders given.

## 2014-04-14 NOTE — Progress Notes (Signed)
Family Medicine Teaching Service Daily Progress Note Intern Pager: [phone]  Patient name: Juan [name] Medical record number: [id number] Date of birth: 1950-05-10 Age: 64 y.o. Gender: male  Primary Care Provider: Renold Don, MD Consultants: None Code Status: Full  Pt Overview and Major Events to Date:  3/9: Admitted for seizure  Assessment and Plan: Juan [name] is a 64 y.o. male presenting with witnessed seizure and increased WOB. PMH is significant for alcoholism and alcoholism related seizures, CHF, anxiety, depression, HLD, HTN, asthma.  #Seizure, in the setting of Alcohol abuse: last drink the day before admission. Neg for alcohol in ED. Slightly diminished mentation but oriented x3. Tachycardic. Denies HA, CP, dizziness, vision changes, n/v/d/c. - SDU CIWA protocol and monitor vitals. - Last 3 CIWA score 14,8, 5 - Telemetry - SW c/s for substance abuse and to help find treatment program - patient still not ready for treatment program - Daily PPI for GI ppx - Banana bag @125ml /hr; Thiamine and folic acid replacement - Lactic acid 11.5  #Hyponatremia: Na 124 on admission, seems to be chronic issue with Na recently 124 -127. Likely related to beer potomania in setting of chronic alcoholism. Today Na 130. - Nutrition consult - GOAL: correct at rate of <0.5 meq/L/hr (due to chronic nature) - Heart healthy diet. -continue to monitor  #Tachypnea w/ hx of COPD and aspiration PNA: lactic acid 11.5. Hx of aspiration pneumonia. CXR with no acute process. Doesn't appear to be having an exacerbation.  -Would need to cover for aspiration if initiating ABX.  -Duonebs scheduled BID -Continue home singulair  - BNP - 133 - sputum culture inadequate - Blood cultures pending - ABG: wnl - CTA Chest negative for PE -continue to monitor  #Diastolic CHF: echo 09/2013 EF 27-61%, grade 1 diastolic dysfunction. SLIV.  -Continue home K, aspirin -Hold lasix and spironilactone at  this time due to soft pressures -strict I's and O's - cycled troponins negative  #HTN: BP mildly hypertensive while in the ED.Poor med compliance at home. -Continue Coreg and lisinopril at decreased dose and -holding spiro and lasix home Juan [name]   #Peripheral Neuropathy:Patient states he does not take gabapentin at home. -Continue home Gabapentin  #Anxiety/depression:  -Continue Cymbalta - Holding home Klonopin, Ativan - on CIWA protocol  #Tobacco abuse: 60 pack year hx.Smokes 1 ppd. - smoking cessation counseling; patient not willing to quit at this time -Patient declines nicotine patch  #Hx of Umbilical Hernia. Stable. Not incarcerated or ulcerated.  - No tenderness on exam  FEN/GI: SLIV; heart healthy diet Prophylaxis: SQ Hep  Disposition: Continue current management as above; pending improvement.  Subjective:  Patient doing well this morning. He did not have any overnight events. Patient still endorsing some dyspnea and wheezing. Discussed with him alcohol cessation and treatment and patient not ready to make a change.  Objective: Temp:  [98.4 F (36.9 C)-99.3 F (37.4 C)] 99.1 F (37.3 C) (03/10 0534) Pulse Rate:  [88-131] 102 (03/10 0600) Resp:  [14-28] 27 (03/10 0534) BP: (84-131)/(46-86) 123/80 mmHg (03/10 0600) SpO2:  [93 %-99 %] 95 % (03/10 0534) Weight:  [183 lb 6.8 oz (83.2 kg)] 183 lb 6.8 oz (83.2 kg) (03/09 1703) Physical Exam: General - oriented x3, cooperative. NAD HEENT - Head is normocephalic. EOMI. MMM Integument - intact. No rash, erythema, or ecchymoses.  Chest - poor expansion. Diffuse wheezing throughout. Decreased air movement. Comfortable work of breathing. Cardiac - tachycardic, regular rhythm. No murmurs noted,  Abdomen - obese, soft, nontender. Umbilical hernia  appreciated. Bowel sounds present. CNS - grossly intact Extremeties - no tenderness or effusions noted. No edema. ROM good. 5/5 bilateral strength. Dorsalis pedis pulses present and  symmetrical.   Laboratory: Results for orders placed or performed during the hospital encounter of 04/13/14 (from the past 24 hour(s))  Culture, sputum-assessment     Status: None   Collection Time: 04/13/14 11:48 AM  Result Value Ref Range   Specimen Description SPU    Special Requests NONE    Sputum evaluation      MICROSCOPIC FINDINGS SUGGEST THAT THIS SPECIMEN IS NOT REPRESENTATIVE OF LOWER RESPIRATORY SECRETIONS. PLEASE RECOLLECT. Gram Stain Report Called to,Read Back By and Verified With: Vinnie Langton AT 1421 04/13/14 BY K BARR    Report Status 04/13/2014 FINAL   Lactic acid, plasma     Status: Abnormal   Collection Time: 04/13/14  2:30 PM  Result Value Ref Range   Lactic Acid, Venous 2.1 (HH) 0.5 - 2.0 mmol/L  Troponin I     Status: None   Collection Time: 04/13/14  2:31 PM  Result Value Ref Range   Troponin I <0.03 <0.031 ng/mL  Basic metabolic panel     Status: Abnormal   Collection Time: 04/13/14  2:31 PM  Result Value Ref Range   Sodium 132 (L) 135 - 145 mmol/L   Potassium 3.7 3.5 - 5.1 mmol/L   Chloride 101 96 - 112 mmol/L   CO2 24 19 - 32 mmol/L   Glucose, Bld 110 (H) 70 - 99 mg/dL   BUN <5 (L) 6 - 23 mg/dL   Creatinine, Ser 3.08 0.50 - 1.35 mg/dL   Calcium 7.6 (L) 8.4 - 10.5 mg/dL   GFR calc non Af Amer 78 (L) >90 mL/min   GFR calc Af Amer >90 >90 mL/min   Anion gap 7 5 - 15  MRSA PCR Screening     Status: None   Collection Time: 04/13/14  5:23 PM  Result Value Ref Range   MRSA by PCR NEGATIVE NEGATIVE  Troponin I     Status: None   Collection Time: 04/13/14  6:41 PM  Result Value Ref Range   Troponin I <0.03 <0.031 ng/mL  Brain natriuretic peptide     Status: Abnormal   Collection Time: 04/13/14  9:40 PM  Result Value Ref Range   B Natriuretic Peptide 133.5 (H) 0.0 - 100.0 pg/mL  CBC     Status: Abnormal   Collection Time: 04/14/14  5:47 AM  Result Value Ref Range   WBC 8.7 4.0 - 10.5 K/uL   RBC 3.33 (L) 4.22 - 5.81 MIL/uL   Hemoglobin 10.5 (L)  13.0 - 17.0 g/dL   HCT 65.7 (L) 84.6 - 96.2 %   MCV 93.1 78.0 - 100.0 fL   MCH 31.5 26.0 - 34.0 pg   MCHC 33.9 30.0 - 36.0 g/dL   RDW 95.2 84.1 - 32.4 %   Platelets 292 150 - 400 K/uL  Comprehensive metabolic panel     Status: Abnormal   Collection Time: 04/14/14  5:47 AM  Result Value Ref Range   Sodium 130 (L) 135 - 145 mmol/L   Potassium 3.6 3.5 - 5.1 mmol/L   Chloride 103 96 - 112 mmol/L   CO2 24 19 - 32 mmol/L   Glucose, Bld 98 70 - 99 mg/dL   BUN 6 6 - 23 mg/dL   Creatinine, Ser 4.01 0.50 - 1.35 mg/dL   Calcium 7.3 (L) 8.4 - 10.5 mg/dL   Total  Protein 5.1 (L) 6.0 - 8.3 g/dL   Albumin 2.7 (L) 3.5 - 5.2 g/dL   AST 37 0 - 37 U/L   ALT 20 0 - 53 U/L   Alkaline Phosphatase 89 39 - 117 U/L   Total Bilirubin 0.9 0.3 - 1.2 mg/dL   GFR calc non Af Amer 88 (L) >90 mL/min   GFR calc Af Amer >90 >90 mL/min   Anion gap 3 (L) 5 - 15    Imaging/Diagnostic Tests: Dg Chest 2 View 04/13/2014   IMPRESSION: 1. Mild cardiomegaly, new/progressed from prior exam. 2. Hyperinflation.  Probable bibasilar atelectasis.   Ct Angio Chest Pe W/cm &/or Wo Cm 04/13/2014  IMPRESSION: 1. No pulmonary embolus. 2. Chronic lung disease with bronchiectasis and coarse reticular opacities, most significant in the right upper lobe. This appears similar to prior exam. Mild emphysema. 3. Mild atherosclerosis.  Coronary artery calcifications.    Pincus Large, DO 04/14/2014, 7:59 AM PGY-1, [facility] FPTS Intern pager: [phone], text pages welcome

## 2014-04-14 NOTE — Progress Notes (Signed)
Nutrition Brief Note  RD consulted for Chronic Hyponatremia  Wt Readings from Last 15 Encounters:  04/13/14 183 lb 6.8 oz (83.2 kg)  03/04/14 185 lb 3.2 oz (84.006 kg)  02/18/14 184 lb (83.462 kg)  02/17/14 184 lb 8 oz (83.689 kg)  02/13/14 184 lb 1.4 oz (83.5 kg)  02/09/14 182 lb 1.6 oz (82.6 kg)  01/31/14 186 lb 9.6 oz (84.641 kg)  01/26/14 182 lb 6.9 oz (82.75 kg)  01/04/14 192 lb 9.6 oz (87.363 kg)  12/14/13 183 lb 8 oz (83.235 kg)  12/01/13 186 lb 8 oz (84.596 kg)  11/19/13 188 lb 0.8 oz (85.3 kg)  10/26/13 188 lb 12.8 oz (85.639 kg)  08/03/13 187 lb 3.2 oz (84.913 kg)  06/29/13 179 lb 3.2 oz (81.285 kg)    Body mass index is 27.9 kg/(m^2). Patient meets criteria for Overweight based on current BMI. Patient denies any recent weight loss. He states that for 4 days PTA he was only drinking Bourbon, only ate a few crackers daily. He denies any nausea, vomiting, abdominal pain, and diarrhea  Current diet order is Heart Healthy diet, patient is consuming approximately 100% of meals at this time. Patient states that his appetite is good and he is eating very well. Labs and medications reviewed.   RD emphasized the importance of getting adequate nutrition, especially in the setting of alcohol abuse. Provided and discussed "Sobriety Nutrition Therapy" handout from the Academy of [employer]etics. Encouraged eating/snacking every 3-4 hours. Discussed healthy snacks and beverages that are easy to prepare or take on the go. Discouraged high intake of caffeine and sugar-sweetened beverages. Patient appreciate of information provided and will share with his wife.   No further nutrition interventions warranted at this time. If nutrition issues arise, please re-consult RD.   Ian Malkin RD, LDN Inpatient Clinical Dietitian Pager: [phone] After Hours Pager: [phone]

## 2014-04-15 ENCOUNTER — Inpatient Hospital Stay (HOSPITAL_COMMUNITY): Payer: No Typology Code available for payment source

## 2014-04-15 DIAGNOSIS — F10230 Alcohol dependence with withdrawal, uncomplicated: Secondary | ICD-10-CM

## 2014-04-15 LAB — BASIC METABOLIC PANEL
Anion gap: 7 (ref 5–15)
BUN: 6 mg/dL (ref 6–23)
CALCIUM: 7.7 mg/dL — AB (ref 8.4–10.5)
CO2: 26 mmol/L (ref 19–32)
Chloride: 97 mmol/L (ref 96–112)
Creatinine, Ser: 0.83 mg/dL (ref 0.50–1.35)
GFR calc Af Amer: >90 mL/min (ref >90)
Glucose, Bld: 110 mg/dL — ABNORMAL HIGH (ref 70–99)
POTASSIUM: 4 mmol/L (ref 3.5–5.1)
Sodium: 130 mmol/L — ABNORMAL LOW (ref 135–145)

## 2014-04-15 LAB — CBC
HEMATOCRIT: 29.1 % — AB (ref 39.0–52.0)
Hemoglobin: 10 g/dL — ABNORMAL LOW (ref 13.0–17.0)
MCH: 31.8 pg (ref 26.0–34.0)
MCHC: 34.4 g/dL (ref 30.0–36.0)
MCV: 92.7 fL (ref 78.0–100.0)
PLATELETS: 232 10*3/uL (ref 150–400)
RBC: 3.14 MIL/uL — ABNORMAL LOW (ref 4.22–5.81)
RDW: 13.7 % (ref 11.5–15.5)
WBC: 7.5 10*3/uL (ref 4.0–10.5)

## 2014-04-15 LAB — TROPONIN I: Troponin I: <0.03 ng/mL (ref <0.031)

## 2014-04-15 LAB — MAGNESIUM: Magnesium: 1.3 mg/dL — ABNORMAL LOW (ref 1.5–2.5)

## 2014-04-15 MED ORDER — FUROSEMIDE 40 MG PO TABS
40.0000 mg | ORAL_TABLET | Freq: Every day | ORAL | Status: DC
  Administered 2014-04-15 – 2014-04-17 (×3): 40 mg via ORAL
  Filled 2014-04-15 (×3): qty 1

## 2014-04-15 MED ORDER — MAGNESIUM SULFATE 4 GM/100ML IV SOLN
4.0000 g | Freq: Once | INTRAVENOUS | Status: AC
  Administered 2014-04-15: 4 g via INTRAVENOUS
  Filled 2014-04-15: qty 100

## 2014-04-15 MED ORDER — TIOTROPIUM BROMIDE MONOHYDRATE 18 MCG IN CAPS
18.0000 ug | ORAL_CAPSULE | Freq: Every day | RESPIRATORY_TRACT | Status: DC
  Administered 2014-04-15 – 2014-04-17 (×3): 18 ug via RESPIRATORY_TRACT
  Filled 2014-04-15: qty 5

## 2014-04-15 MED ORDER — SPIRONOLACTONE 12.5 MG HALF TABLET
12.5000 mg | ORAL_TABLET | Freq: Every day | ORAL | Status: DC
  Administered 2014-04-15 – 2014-04-17 (×3): 12.5 mg via ORAL
  Filled 2014-04-15 (×3): qty 1

## 2014-04-15 MED ORDER — IPRATROPIUM-ALBUTEROL 0.5-2.5 (3) MG/3ML IN SOLN
3.0000 mL | RESPIRATORY_TRACT | Status: DC | PRN
  Administered 2014-04-15 – 2014-04-17 (×4): 3 mL via RESPIRATORY_TRACT
  Filled 2014-04-15 (×4): qty 3

## 2014-04-15 NOTE — Progress Notes (Signed)
Utilization Review Completed.Juan [name] T3/12/2014  

## 2014-04-15 NOTE — Progress Notes (Signed)
Patient being transferred to 5W24. Report called to receiving nurse.

## 2014-04-15 NOTE — Progress Notes (Signed)
Dr. Wende Mott notified of patient having 10 beat run of Vtach. Pt asymptomatic and states that he feels fine. New orders given.

## 2014-04-15 NOTE — Progress Notes (Signed)
Paged about asymptomatic unsustained VT (10 beat). Magnesium, troponin and EKG ordered. Will follow up.  Kathee Delton, MD,MS,  PGY1 04/15/2014 3:34 AM

## 2014-04-15 NOTE — Progress Notes (Signed)
Family Medicine Teaching Service Daily Progress Note Intern Pager: [phone]  Patient name: Juan [name] Medical record number: [id number] Date of birth: 06-18-1950 Age: 64 y.o. Gender: male  Primary Care Provider: Renold Don, MD Consultants: None Code Status: Full  Pt Overview and Major Events to Date:  3/9: Admitted for seizure  Assessment and Plan: Juan [name] is a 64 y.o. male presenting with witnessed seizure and increased WOB. PMH is significant for alcoholism and alcoholism related seizures, CHF, anxiety, depression, HLD, HTN, asthma.  #Seizure, in the setting of Alcohol abuse: last drink the day before admission. Neg for alcohol in ED. Slightly diminished mentation but oriented x3. Tachycardic. Denies HA, CP, dizziness, vision changes, n/v/d/c. Stable - CIWA protocol and monitor vitals. - Last score 4 - transfer to floor - SW c/s for substance abuse and to help find treatment program - patient still not ready for treatment program - Daily PPI for GI ppx - Replace thiamine and folic acid replacement  #Hyponatremia: Na 124 on admission, seems to be chronic issue with Na recently 124 -127. Likely related to beer potomania in setting of chronic alcoholism. Stable at Na 130. - Nutrition consult - no recommendations  - Heart healthy diet. -continue to monitor  #Hypomagnesium: Mg 1.3. Low Mg could have predisposed to VT experienced yesterday. Hopefully will stabilize myocardium with repletiton. -replete Mg with 4g IV Mag sulfate -recheck in AM  #COPD: lactic acid 11.5>2.1. Hx of aspiration pneumonia. CXR with no acute process. Doesn't appear to be having an exacerbation.  -Continue home singulair, spiriva, and combivent - BNP - 133 - sputum culture inadequate - Blood cultures no growth - ABG: wnl - CTA Chest negative for PE -continue to monitor  #Diastolic CHF: echo 09/2013 EF 02-63%, grade 1 diastolic dysfunction. SLIV.  -Continue home medications -strict I's  and O's -cycled troponins negative  #HTN: BP mildly hypertensive while in the ED.Poor med compliance at home. NPO elevated here.  -Continue Coreg, spiro, lasix, and lisinopril at decreased dose   #Peripheral Neuropathy:Patient states he does not take gabapentin at home. -Continue home Gabapentin  #Anxiety/depression:  -Continue Cymbalta - Holding home Klonopin, Ativan - on CIWA protocol  #Tobacco abuse: 60 pack year hx.Smokes 1 ppd. - smoking cessation counseling; patient not willing to quit at this time -Patient declines nicotine patch  #Hx of Umbilical Hernia. Stable. Not incarcerated or ulcerated.  - No tenderness on exam  FEN/GI: SLIV; heart healthy diet Prophylaxis: SQ Hep  Disposition: Continue current management as above; likely home tomorrow.  Subjective:  Patient doing well this morning. Is wondering when he will be able to go home. Says that the breathing treatment sh have been helping him and he feels much improved. Denies any SOB even though he is currently still wearing [place].   Overnight patient had 10 beat run of VT. It was noted that his magnesium was low. Trop neg. Was asymptomatic.  Objective: Temp:  [97.7 F (36.5 C)-98.9 F (37.2 C)] 98.2 F (36.8 C) (03/11 0737) Pulse Rate:  [67-111] 90 (03/11 0737) Resp:  [16-27] 19 (03/11 0737) BP: (103-157)/(75-96) 157/96 mmHg (03/11 0737) SpO2:  [93 %-100 %] 99 % (03/11 0803) Physical Exam: General - oriented x3, cooperative. NAD HEENT - Head is normocephalic. EOMI. MMM Integument - intact. No rash, erythema, or ecchymoses.  Chest - poor expansion. Minimal wheezing in upper air lobes. Decreased air movement. Comfortable work of breathing. Cardiac - RRR. No murmurs noted,  Abdomen - obese, soft, nontender. Umbilical hernia appreciated.  Bowel sounds present. CNS - grossly intact Extremeties - no tenderness or effusions noted. No edema. ROM good. 5/5 bilateral strength. Dorsalis pedis pulses present and  symmetrical.   Laboratory: Results for orders placed or performed during the hospital encounter of 04/13/14 (from the past 24 hour(s))  Troponin I     Status: None   Collection Time: 04/15/14  3:00 AM  Result Value Ref Range   Troponin I <0.03 <0.031 ng/mL  Magnesium     Status: Abnormal   Collection Time: 04/15/14  3:00 AM  Result Value Ref Range   Magnesium 1.3 (L) 1.5 - 2.5 mg/dL  Basic metabolic panel     Status: Abnormal   Collection Time: 04/15/14  3:50 AM  Result Value Ref Range   Sodium 130 (L) 135 - 145 mmol/L   Potassium 4.0 3.5 - 5.1 mmol/L   Chloride 97 96 - 112 mmol/L   CO2 26 19 - 32 mmol/L   Glucose, Bld 110 (H) 70 - 99 mg/dL   BUN 6 6 - 23 mg/dL   Creatinine, Ser 1.61 0.50 - 1.35 mg/dL   Calcium 7.7 (L) 8.4 - 10.5 mg/dL   GFR calc non Af Amer >90 >90 mL/min   GFR calc Af Amer >90 >90 mL/min   Anion gap 7 5 - 15  CBC     Status: Abnormal   Collection Time: 04/15/14  3:50 AM  Result Value Ref Range   WBC 7.5 4.0 - 10.5 K/uL   RBC 3.14 (L) 4.22 - 5.81 MIL/uL   Hemoglobin 10.0 (L) 13.0 - 17.0 g/dL   HCT 09.6 (L) 04.5 - 40.9 %   MCV 92.7 78.0 - 100.0 fL   MCH 31.8 26.0 - 34.0 pg   MCHC 34.4 30.0 - 36.0 g/dL   RDW 81.1 91.4 - 78.2 %   Platelets 232 150 - 400 K/uL    Imaging/Diagnostic Tests: Dg Chest 2 View 04/13/2014   IMPRESSION: 1. Mild cardiomegaly, new/progressed from prior exam. 2. Hyperinflation.  Probable bibasilar atelectasis.   Ct Angio Chest Pe W/cm &/or Wo Cm 04/13/2014  IMPRESSION: 1. No pulmonary embolus. 2. Chronic lung disease with bronchiectasis and coarse reticular opacities, most significant in the right upper lobe. This appears similar to prior exam. Mild emphysema. 3. Mild atherosclerosis.  Coronary artery calcifications.    Pincus Large, DO 04/15/2014, 8:16 AM PGY-1, [facility] FPTS Intern pager: [phone], text pages welcome

## 2014-04-16 DIAGNOSIS — R05 Cough: Secondary | ICD-10-CM

## 2014-04-16 DIAGNOSIS — R059 Cough, unspecified: Secondary | ICD-10-CM | POA: Insufficient documentation

## 2014-04-16 LAB — CBC
HCT: 30.6 % — ABNORMAL LOW (ref 39.0–52.0)
HEMOGLOBIN: 10.5 g/dL — AB (ref 13.0–17.0)
MCH: 31.2 pg (ref 26.0–34.0)
MCHC: 34.3 g/dL (ref 30.0–36.0)
MCV: 90.8 fL (ref 78.0–100.0)
Platelets: 213 10*3/uL (ref 150–400)
RBC: 3.37 MIL/uL — AB (ref 4.22–5.81)
RDW: 13.5 % (ref 11.5–15.5)
WBC: 7.8 10*3/uL (ref 4.0–10.5)

## 2014-04-16 LAB — BASIC METABOLIC PANEL
ANION GAP: 10 (ref 5–15)
BUN: <5 mg/dL — ABNORMAL LOW (ref 6–23)
CALCIUM: 8.2 mg/dL — AB (ref 8.4–10.5)
CHLORIDE: 94 mmol/L — AB (ref 96–112)
CO2: 25 mmol/L (ref 19–32)
Creatinine, Ser: 0.71 mg/dL (ref 0.50–1.35)
GFR calc Af Amer: >90 mL/min (ref >90)
GFR calc non Af Amer: >90 mL/min (ref >90)
GLUCOSE: 102 mg/dL — AB (ref 70–99)
POTASSIUM: 4.1 mmol/L (ref 3.5–5.1)
Sodium: 129 mmol/L — ABNORMAL LOW (ref 135–145)

## 2014-04-16 LAB — MAGNESIUM: MAGNESIUM: 1.7 mg/dL (ref 1.5–2.5)

## 2014-04-16 MED ORDER — DM-GUAIFENESIN ER 30-600 MG PO TB12
1.0000 | ORAL_TABLET | Freq: Two times a day (BID) | ORAL | Status: DC
  Administered 2014-04-16 – 2014-04-17 (×2): 1 via ORAL
  Filled 2014-04-16 (×3): qty 1

## 2014-04-16 NOTE — Progress Notes (Signed)
Family Medicine Teaching Service Daily Progress Note Intern Pager: [phone]  Patient name: Juan [name] Medical record number: [id number] Date of birth: June 01, 1950 Age: 64 y.o. Gender: male  Primary Care Provider: Renold Don, MD Consultants: None Code Status: Full  Pt Overview and Major Events to Date:  3/9: Admitted for seizure  Assessment and Plan: Juan [name] is a 64 y.o. male presenting with witnessed seizure and increased WOB. PMH is significant for alcoholism and alcoholism related seizures, CHF, anxiety, depression, HLD, HTN, asthma.  #Seizure, in the setting of Alcohol abuse: last drink the day before admission. Neg for alcohol in ED. Slightly diminished mentation but oriented x3. Tachycardic. Denies HA, CP, dizziness, vision changes, n/v/d/c. Stable - CIWA protocol and monitor vitals. - Last score 4 (not documented in 24 hours) - SW c/s for substance abuse and to help find treatment program - patient still not ready for treatment program - Daily PPI for GI ppx - Replaced thiamine and folic acid replacement - CT: No acute intracranial findings. Minimal chronic ischemic microvascular disease.Mild chronic sinus inflammatory disease.  #Hyponatremia: Na 124 on admission, seems to be chronic issue with Na recently 124 -127. Likely related to beer potomania in setting of chronic alcoholism. Stable at Na 130>129. - Nutrition consult - no recommendations  - Heart healthy diet.  #Hypomagnesium: Mg 1.3. Low Mg could have predisposed to VT experienced yesterday. Hopefully will stabilize myocardium with repletiton. -repleted Mg with 4g IV Mag sulfate  #COPD: lactic acid 11.5>2.1. Hx of aspiration pneumonia. CXR with no acute process. Doesn't appear to be having an exacerbation.  -Continue home singulair, spiriva, and combivent - BNP - 133 - sputum culture inadequate - Blood cultures no growth - ABG: wnl - CTA Chest negative for PE  #Diastolic CHF: echo 09/2013 EF 69-62%,  grade 1 diastolic dysfunction. SLIV.  -Continue home medications -strict I's and O's -cycled troponins negative - Echo ordered  #HTN: BP mildly hypertensive while in the ED.Poor med compliance at home. NPO elevated here.  -Continue Coreg, spiro, lasix, and lisinopril at decreased dose   #Peripheral Neuropathy:Patient states he does not take gabapentin at home. -Continue home Gabapentin  #Anxiety/depression:  -Continue Cymbalta - Holding home Klonopin, Ativan - on CIWA protocol  #Tobacco abuse: 60 pack year hx.Smokes 1 ppd. - smoking cessation counseling; patient not willing to quit at this time -Patient declines nicotine patch  #Hx of Umbilical Hernia. Stable. Not incarcerated or ulcerated.  - No tenderness on exam  FEN/GI: SLIV; heart healthy diet Prophylaxis: SQ Hep  Disposition: Possible home today pending ambulation and oxygen saturations. Echo is ordered, but if improvement with no Oxygen requirement and able to ambulate, will obtain echo outpatient if not completed prior to being reasy for discharge. Awaiting PT recs.  Subjective:  Patient complains of congestion today. He states his shortness of breath has resolved. He is on room air. He is tolerating PO. He feels ready to go home.   Objective: Temp:  [98.3 F (36.8 C)-99.4 F (37.4 C)] 99.4 F (37.4 C) (03/12 0447) Pulse Rate:  [79-91] 84 (03/12 0447) Resp:  [15-18] 18 (03/12 0447) BP: (119-132)/(69-79) 123/69 mmHg (03/12 0447) SpO2:  [94 %-97 %] 94 % (03/12 0447) Physical Exam: General - oriented x3, cooperative. NAD HEENT - Head is normocephalic. EOMI. MMM, nasal congestion present.  Integument - intact. No rash, erythema, or ecchymoses.  Chest - poor expansion. Minimal wheezing in upper air lobes. Decreased air movement. Comfortable work of breathing. Cardiac - RRR. No murmurs  noted,  Abdomen - obese, soft, nontender. Umbilical hernia appreciated. Bowel sounds present. CNS - grossly intact Extremeties -  no tenderness or effusions noted. No edema. ROM good. 5/5 bilateral strength. Dorsalis pedis pulses present and symmetrical.   Laboratory: Results for orders placed or performed during the hospital encounter of 04/13/14 (from the past 24 hour(s))  Basic metabolic panel     Status: Abnormal   Collection Time: 04/16/14  5:41 AM  Result Value Ref Range   Sodium 129 (L) 135 - 145 mmol/L   Potassium 4.1 3.5 - 5.1 mmol/L   Chloride 94 (L) 96 - 112 mmol/L   CO2 25 19 - 32 mmol/L   Glucose, Bld 102 (H) 70 - 99 mg/dL   BUN <5 (L) 6 - 23 mg/dL   Creatinine, Ser 1.79 0.50 - 1.35 mg/dL   Calcium 8.2 (L) 8.4 - 10.5 mg/dL   GFR calc non Af Amer >90 >90 mL/min   GFR calc Af Amer >90 >90 mL/min   Anion gap 10 5 - 15  CBC     Status: Abnormal   Collection Time: 04/16/14  5:41 AM  Result Value Ref Range   WBC 7.8 4.0 - 10.5 K/uL   RBC 3.37 (L) 4.22 - 5.81 MIL/uL   Hemoglobin 10.5 (L) 13.0 - 17.0 g/dL   HCT 15.0 (L) 56.9 - 79.4 %   MCV 90.8 78.0 - 100.0 fL   MCH 31.2 26.0 - 34.0 pg   MCHC 34.3 30.0 - 36.0 g/dL   RDW 80.1 65.5 - 37.4 %   Platelets 213 150 - 400 K/uL  Magnesium     Status: None   Collection Time: 04/16/14  5:41 AM  Result Value Ref Range   Magnesium 1.7 1.5 - 2.5 mg/dL   EKG: RBBB, ST with PVC (uncganged)  Imaging/Diagnostic Tests: Dg Chest 2 View 04/13/2014   IMPRESSION: 1. Mild cardiomegaly, new/progressed from prior exam. 2. Hyperinflation.  Probable bibasilar atelectasis.   Ct Angio Chest Pe W/cm &/or Wo Cm 04/13/2014  IMPRESSION: 1. No pulmonary embolus. 2. Chronic lung disease with bronchiectasis and coarse reticular opacities, most significant in the right upper lobe. This appears similar to prior exam. Mild emphysema. 3. Mild atherosclerosis.  Coronary artery calcifications.    CT head (04/14/2013): No acute intracranial findings. Minimal chronic ischemic microvascular disease.Mild chronic sinus inflammatory disease.  Natalia Leatherwood, DO 04/16/2014, 8:40 AM PGY-3, [facility] FPTS Intern pager: [phone], text pages welcome

## 2014-04-16 NOTE — Progress Notes (Signed)
  Echocardiogram 2D Echocardiogram has been performed.  Delcie Roch 04/16/2014, 11:03 AM

## 2014-04-16 NOTE — Evaluation (Addendum)
Physical Therapy Evaluation Patient Details Name: Juan [name] MRN: [id number] DOB: [birth year] Today's Date: 04/16/2014   History of Present Illness  Admit with dyspnea and weakness.  Pt with ETOH withdrawal and seizure.    Clinical Impression  Pt admitted with above diagnosis. Pt currently with only slight  functional limitations when not using RW.  Pt should do well at home using RW at all times initially as pt states he is mostly better than his baseline prior to coming in hospital.  Pt did not get winded today and sats >90% on RA with ambulation.  Pt reports he will use RW at home and already has a RW.  Recommend a HHPT safety eval as well.  Will follow pt acutely if he stays in hospital.   Pt will benefit from skilled PT to increase their independence and safety with mobility to allow discharge to the venue listed below.      Follow Up Recommendations Home health PT;Supervision - Intermittent (safety eval)    Equipment Recommendations  None recommended by PT    Recommendations for Other Services       Precautions / Restrictions Precautions Precautions: Fall Restrictions Weight Bearing Restrictions: No      Mobility  Bed Mobility Overal bed mobility: Independent                Transfers Overall transfer level: Independent                  Ambulation/Gait Ambulation/Gait assistance: supervision-minguard assist Ambulation Distance (Feet): 350 Feet Assistive device: None Gait Pattern/deviations: Step-through pattern;Decreased stride length;Drifts right/left   Gait velocity interpretation: <1.8 ft/sec, indicative of risk for recurrent falls General Gait Details: Pt able to ambulate well overall with occasional LOB with challenges but was able to self correct.  HAs a RW at home and can use it prn.  Informed pt to be safest he should use RW at home and pt agreed as pt can ambulate with RW with Modif I  Stairs            Wheelchair Mobility     Modified Rankin (Stroke Patients Only)       Balance Overall balance assessment: Needs assistance Sitting-balance support: No upper extremity supported;Feet supported Sitting balance-Leahy Scale: Good     Standing balance support: No upper extremity supported;During functional activity Standing balance-Leahy Scale: Fair Standing balance comment: can stand statically and balance on his own.                  Standardized Balance Assessment Standardized Balance Assessment : Dynamic Gait Index   Dynamic Gait Index Level Surface: Normal Change in Gait Speed: Normal Gait with Horizontal Head Turns: Mild Impairment Gait with Vertical Head Turns: Mild Impairment Gait and Pivot Turn: Normal Step Over Obstacle: Normal Step Around Obstacles: Normal Steps: Mild Impairment Total Score: 21       Pertinent Vitals/Pain Pain Assessment: No/denies pain  Sats >90% on RA.  DOE 1/4 with ambulation.      Home Living Family/patient expects to be discharged to:: Private residence Living Arrangements: Spouse/significant other Available Help at Discharge: Family;Available PRN/intermittently Type of Home: House Home Access: Level entry (front)     Home Layout: Two level;Able to live on main level with bedroom/bathroom;Laundry or work area in [employer] Equipment: Environmental consultant - 2 wheels;Shower seat;Grab bars - tub/shower (walking stick)      Prior Function Level of Independence: Independent         Comments: Pt  drives rarely     Hand Dominance   Dominant Hand: Right    Extremity/Trunk Assessment   Upper Extremity Assessment: Defer to OT evaluation           Lower Extremity Assessment: Generalized weakness      Cervical / Trunk Assessment: Kyphotic  Communication   Communication: No difficulties  Cognition Arousal/Alertness: Awake/alert Behavior During Therapy: WFL for tasks assessed/performed Overall Cognitive Status: Within Functional Limits for tasks  assessed                      General Comments General comments (skin integrity, edema, etc.): Scored 21/24 on DGI suggesting low risk of falls.  Still recommend RW at home due to pt with general weakness and difficultywith higher level activities.      Exercises General Exercises - Lower Extremity Hip ABduction/ADduction: AROM;Both;15 reps;Standing Hip Flexion/Marching: AROM;Both;15 reps;Standing Toe Raises: AROM;Both;15 reps;Standing Mini-Sqauts: AROM;Both;15 reps;Standing      Assessment/Plan    PT Assessment Patient needs continued PT services  PT Diagnosis Generalized weakness   PT Problem List Decreased balance;Decreased mobility;Decreased safety awareness;Decreased knowledge of precautions  PT Treatment Interventions DME instruction;Gait training;Functional mobility training;Therapeutic activities;Therapeutic exercise;Balance training;Patient/family education   PT Goals (Current goals can be found in the Care Plan section) Acute Rehab PT Goals Patient Stated Goal: to go home PT Goal Formulation: With patient Time For Goal Achievement: 04/23/14 Potential to Achieve Goals: Good    Frequency Min 3X/week   Barriers to discharge        Co-evaluation               End of Session Equipment Utilized During Treatment: Gait belt Activity Tolerance: Patient tolerated treatment well Patient left: in bed;with call bell/phone within reach Nurse Communication: Mobility status         Time: 6789-3810 PT Time Calculation (min) (ACUTE ONLY): 12 min   Charges:   PT Evaluation $Initial PT Evaluation Tier I: 1 Procedure     PT G CodesBerline Lopes 23-Apr-2014, 5:05 PM Myan Locatelli [facility] Acute Rehabilitation [phone] (pager)

## 2014-04-17 LAB — CBC
HCT: 34 % — ABNORMAL LOW (ref 39.0–52.0)
Hemoglobin: 11.5 g/dL — ABNORMAL LOW (ref 13.0–17.0)
MCH: 31.1 pg (ref 26.0–34.0)
MCHC: 33.8 g/dL (ref 30.0–36.0)
MCV: 91.9 fL (ref 78.0–100.0)
Platelets: 209 10*3/uL (ref 150–400)
RBC: 3.7 MIL/uL — AB (ref 4.22–5.81)
RDW: 13.6 % (ref 11.5–15.5)
WBC: 8.8 10*3/uL (ref 4.0–10.5)

## 2014-04-17 LAB — BASIC METABOLIC PANEL
Anion gap: 10 (ref 5–15)
BUN: 7 mg/dL (ref 6–23)
CO2: 27 mmol/L (ref 19–32)
Calcium: 8.7 mg/dL (ref 8.4–10.5)
Chloride: 92 mmol/L — ABNORMAL LOW (ref 96–112)
Creatinine, Ser: 0.9 mg/dL (ref 0.50–1.35)
GFR calc Af Amer: >90 mL/min (ref >90)
GFR, EST NON AFRICAN AMERICAN: 89 mL/min — AB (ref >90)
Glucose, Bld: 109 mg/dL — ABNORMAL HIGH (ref 70–99)
POTASSIUM: 4.3 mmol/L (ref 3.5–5.1)
Sodium: 129 mmol/L — ABNORMAL LOW (ref 135–145)

## 2014-04-17 MED ORDER — DICYCLOMINE HCL 20 MG PO TABS: 20.0000 mg | ORAL_TABLET | Freq: Three times a day (TID) | ORAL | Status: DC

## 2014-04-17 MED ORDER — LISINOPRIL 20 MG PO TABS: 10.0000 mg | ORAL_TABLET | Freq: Every day | ORAL | Status: DC

## 2014-04-17 NOTE — Progress Notes (Signed)
Family Medicine Teaching Service Daily Progress Note Intern Pager: [phone]  Patient name: Juan [name] Medical record number: [id number] Date of birth: May 10, 1950 Age: 64 y.o. Gender: male  Primary Care Provider: Renold Don, MD Consultants: None Code Status: Full  Pt Overview and Major Events to Date:  3/9: Admitted for seizure  Assessment and Plan: Juan [name] is a 64 y.o. male presenting with witnessed seizure and increased WOB. PMH is significant for alcoholism and alcoholism related seizures, CHF, anxiety, depression, HLD, HTN, asthma.  #Seizure, in the setting of Alcohol abuse: last drink the day before admission. Neg for alcohol in ED. Slightly diminished mentation but oriented x3. Tachycardic. Denies HA, CP, dizziness, vision changes, n/v/d/c. Stable - CIWA protocol and monitor vitals. - Last score 4 (not documented in 24 hours) - SW c/s for substance abuse and to help find treatment program - patient still not ready for treatment program - Daily PPI for GI ppx - Replaced thiamine and folic acid - CT: No acute intracranial findings. Minimal chronic ischemic microvascular disease.Mild chronic sinus inflammatory disease.  #Hyponatremia: Na 124 on admission, seems to be chronic issue with Na recently 124 -127. Likely related to beer potomania in setting of chronic alcoholism. Stable at Na 130>129. - Nutrition consult - no recommendations  - Heart healthy diet.  #Hypomagnesium: Mg 1.3. Low Mg could have predisposed to VT experienced earlier. Hopefully will stabilize myocardium with repletiton. -repleted Mg with 4g IV Mag sulfate  #COPD: lactic acid 11.5>2.1. Hx of aspiration pneumonia. CXR with no acute process. Doesn't appear to be having an exacerbation.  -Continue home singulair, spiriva, and combivent - BNP - 133 - sputum culture inadequate - Blood cultures no growth - ABG: wnl - CTA Chest negative for PE - CONSIDER OUTPATIENT PULM FUNCTION TESTS  #Diastolic  CHF:  -Continue home medications -strict I's and O's -cycled troponins negative - Echo ordered 3/12  - EF 55-60%; mild concentric LV hypertrophy; LA with moderate dilation.  #HTN: BP mildly hypertensive while in the ED.Poor med compliance at home. NPO elevated here.  -Continue Coreg, spiro, lasix, and lisinopril at decreased dose   #Peripheral Neuropathy:Patient states he does not take gabapentin at home. -Continue home Gabapentin  #Anxiety/depression:  -Continue Cymbalta - Holding home Klonopin, Ativan - on CIWA protocol  #Tobacco abuse: 60 pack year hx.Smokes 1 ppd. - smoking cessation counseling; patient not willing to quit at this time -Patient declines nicotine patch  #Hx of Umbilical Hernia. Stable. Not incarcerated or ulcerated.  - No tenderness on exam  FEN/GI: SLIV; heart healthy diet Prophylaxis: SQ Hep  Disposition: Tolerated ambulation with PT; recommended HHPT; ready for DC today.  Subjective:  Patient complains of congestion today. He states his shortness of breath has resolved. He is on room air. He is tolerating PO. He feels ready to go home.   Objective: Temp:  [99.3 F (37.4 C)-99.6 F (37.6 C)] 99.6 F (37.6 C) (03/13 0515) Pulse Rate:  [77-78] 77 (03/13 0515) Resp:  [18-20] 20 (03/13 0515) BP: (118-133)/(71-77) 126/76 mmHg (03/13 0515) SpO2:  [91 %-97 %] 94 % (03/13 0923) Physical Exam: General - oriented x3, cooperative. NAD HEENT - Head is normocephalic. EOMI. MMM, nasal congestion present.  Integument - intact. No rash, erythema, or ecchymoses.  Chest - poor expansion. Minimal wheezing in upper air lobes. Decreased air movement. Comfortable work of breathing. Cardiac - RRR. No murmurs noted,  Abdomen - obese, soft, nontender. Umbilical hernia appreciated. Bowel sounds present. CNS - grossly intact Extremeties -  no tenderness or effusions noted. No edema. ROM good. 5/5 bilateral strength. Dorsalis pedis pulses present and symmetrical.    Laboratory: Results for orders placed or performed during the hospital encounter of 04/13/14 (from the past 24 hour(s))  Basic metabolic panel     Status: Abnormal   Collection Time: 04/17/14  5:56 AM  Result Value Ref Range   Sodium 129 (L) 135 - 145 mmol/L   Potassium 4.3 3.5 - 5.1 mmol/L   Chloride 92 (L) 96 - 112 mmol/L   CO2 27 19 - 32 mmol/L   Glucose, Bld 109 (H) 70 - 99 mg/dL   BUN 7 6 - 23 mg/dL   Creatinine, Ser 9.60 0.50 - 1.35 mg/dL   Calcium 8.7 8.4 - 45.4 mg/dL   GFR calc non Af Amer 89 (L) >90 mL/min   GFR calc Af Amer >90 >90 mL/min   Anion gap 10 5 - 15  CBC     Status: Abnormal   Collection Time: 04/17/14  5:56 AM  Result Value Ref Range   WBC 8.8 4.0 - 10.5 K/uL   RBC 3.70 (L) 4.22 - 5.81 MIL/uL   Hemoglobin 11.5 (L) 13.0 - 17.0 g/dL   HCT 09.8 (L) 11.9 - 14.7 %   MCV 91.9 78.0 - 100.0 fL   MCH 31.1 26.0 - 34.0 pg   MCHC 33.8 30.0 - 36.0 g/dL   RDW 82.9 56.2 - 13.0 %   Platelets 209 150 - 400 K/uL   EKG: RBBB, ST with PVC (uncganged)  Imaging/Diagnostic Tests: Dg Chest 2 View 04/13/2014   IMPRESSION: 1. Mild cardiomegaly, new/progressed from prior exam. 2. Hyperinflation.  Probable bibasilar atelectasis.   Ct Angio Chest Pe W/cm &/or Wo Cm 04/13/2014  IMPRESSION: 1. No pulmonary embolus. 2. Chronic lung disease with bronchiectasis and coarse reticular opacities, most significant in the right upper lobe. This appears similar to prior exam. Mild emphysema. 3. Mild atherosclerosis.  Coronary artery calcifications.    CT head (04/14/2013): No acute intracranial findings. Minimal chronic ischemic microvascular disease.Mild chronic sinus inflammatory disease.  Kathee Delton, MD 04/17/2014, 12:44 PM PGY-1, [facility] FPTS Intern pager: [phone], text pages welcome

## 2014-04-17 NOTE — Discharge Instructions (Signed)
[facility] Crisis Call Center (for just about everything, including Substance Abuse) Tel: [phone]   Alcoholics Anonymous [address], [place], [place] [address]. Tel: [phone]  Finding Treatment for Alcohol and Drug Addiction It can be hard to find the right place to get professional treatment. Here are some important things to consider:  There are different types of treatment to choose from.  Some programs are live-in (residential) while others are not (outpatient). Sometimes a combination is offered.  No single type of program is right for everyone.  Most treatment programs involve a combination of education, counseling, and a 12-step, spiritually-based approach.  There are non-spiritually based programs (not 12-step).  Some treatment programs are government sponsored. They are geared for patients without private insurance.  Treatment programs can vary in many respects such as:  Cost and types of insurance accepted.  Types of on-site medical services offered.  Length of stay, setting, and size.  Overall philosophy of treatment. A person may need specialized treatment or have needs not addressed by all programs. For example, adolescents need treatment appropriate for their age. Other people have secondary disorders that must be managed as well. Secondary conditions can include mental illness, such as depression or diabetes. Often, a period of detoxification from alcohol or drugs is needed. This requires medical supervision and not all programs offer this. THINGS TO CONSIDER WHEN SELECTING A TREATMENT PROGRAM   Is the program certified by the appropriate government agency? Even private programs must be certified and employ certified professionals.  Does the program accept your insurance? If not, can a payment plan be set up?  Is the facility clean, organized, and well run? Do they allow you to speak with graduates who can share their treatment experience with you? Can  you tour the facility? Can you meet with staff?  Does the program meet the full range of individual needs?  Does the treatment program address sexual orientation and physical disabilities? Do they provide age, gender, and culturally appropriate treatment services?  Is treatment available in languages other than English?  Is long-term aftercare support or guidance encouraged and provided?  Is assessment of an individual's treatment plan ongoing to ensure it meets changing needs?  Does the program use strategies to encourage reluctant patients to remain in treatment long enough to increase the likelihood of success?  Does the program offer counseling (individual or group) and other behavioral therapies?  Does the program offer medicine as part of the treatment regimen, if needed?  Is there ongoing monitoring of possible relapse? Is there a defined relapse prevention program? Are services or referrals offered to family members to ensure they understand addiction and the recovery process? This would help them support the recovering individual.  Are 12-step meetings held at the center or is transport available for patients to attend outside meetings? In countries outside of the [place]. and [place], Magazine features editor for contact information for services in your area. Document Released: 12/20/2004 Document Revised: 04/15/2011 Document Reviewed: 07/02/2007 [facility] Patient Information 2015 [place], [place]. This information is not intended to replace advice given to you by your health care provider. Make sure you discuss any questions you have with your health care provider.

## 2014-04-17 NOTE — Progress Notes (Signed)
NURSING PROGRESS NOTE  Juan [name] [id number] Discharge Data: 04/17/2014 4:51 PM Attending Provider: Carney Living, MD MQK:MMNOTR,RNHA, MD     [name] to be D/C'd Home per MD order.  Discussed with the patient the After Visit Summary and all questions fully answered. All IV's discontinued with no bleeding noted. All belongings returned to patient for patient to take home.   Last Vital Signs:  Blood pressure 116/69, pulse 81, temperature 98.6 F (37 C), temperature source Oral, resp. rate 18, height 5\' 8"  (1.727 m), weight 83.2 kg (183 lb 6.8 oz), SpO2 95 %.  Discharge Medication List   Medication List    TAKE these medications        acetaminophen 500 MG tablet  Commonly known as:  TYLENOL  Take 1,500 mg by mouth every 6 (six) hours as needed for headache (headache).     aspirin 81 MG EC tablet  Take 1 tablet (81 mg total) by mouth daily.     atorvastatin 40 MG tablet  Commonly known as:  LIPITOR  Take 1 tablet (40 mg total) by mouth daily at 6 PM.     B-Complex Caps  Take 1 capsule by mouth daily.     carvedilol 12.5 MG tablet  Commonly known as:  COREG  Take 1 tablet (12.5 mg total) by mouth 2 (two) times daily with a meal.     clonazePAM 0.5 MG tablet  Commonly known as:  KLONOPIN  Take 1 tablet (0.5 mg total) by mouth 2 (two) times daily as needed for anxiety.     COMBIVENT RESPIMAT 20-100 MCG/ACT Aers respimat  Generic drug:  Ipratropium-Albuterol  INHALE TWO PUFFS BY MOUTH EVERY FOUR HOURS AS NEEDED for wheezing     dicyclomine 20 MG tablet  Commonly known as:  BENTYL  Take 1 tablet (20 mg total) by mouth 3 (three) times daily before meals.     DULoxetine 30 MG capsule  Commonly known as:  CYMBALTA  Take 1 capsule (30 mg total) by mouth daily.     fluticasone 50 MCG/ACT nasal spray  Commonly known as:  FLONASE  Use two sprays in each nostril daily     furosemide 40 MG tablet  Commonly known as:  LASIX  Take 1 tablet (40 mg total)  by mouth daily.     gabapentin 300 MG capsule  Commonly known as:  NEURONTIN  TAKE ONE CAPSULE BY MOUTH THREE TIMES DAILY     lisinopril 20 MG tablet  Commonly known as:  PRINIVIL,ZESTRIL  Take 0.5 tablets (10 mg total) by mouth daily.     Magnesium 500 MG Tabs  Take 500 mg by mouth daily.     montelukast 10 MG tablet  Commonly known as:  SINGULAIR  Take 1 tablet (10 mg total) by mouth at bedtime.     multivitamin with minerals Tabs tablet  Take 1 tablet by mouth daily.     potassium chloride SA 20 MEQ tablet  Commonly known as:  K-DUR,KLOR-CON  Take 2 tablets (40 mEq total) by mouth daily.     spironolactone 25 MG tablet  Commonly known as:  ALDACTONE  TAKE HALF TABLET BY MOUTH DAILY     tiotropium 18 MCG inhalation capsule  Commonly known as:  SPIRIVA  Place 1 capsule (18 mcg total) into inhaler and inhale daily.     triamcinolone 0.025 % ointment  Commonly known as:  KENALOG  Apply 1 application topically 2 (two) times daily.  Vitamin D (Ergocalciferol) 50000 UNITS Caps capsule  Commonly known as:  DRISDOL  Take 1 capsule (50,000 Units total) by mouth every 7 (seven) days.

## 2014-04-18 NOTE — Discharge Summary (Signed)
Family Medicine Teaching [facility] Discharge Summary  Patient name: Juan [name] Medical record number: [id number] Date of birth: 05/07/1950 Age: 64 y.o. Gender: male Date of Admission: 04/13/2014  Date of Discharge: 04/17/2014 Admitting Physician: Carney Living, MD  Primary Care Provider: Renold Don, MD Consultants: None  Indication for Hospitalization: Seizure  Discharge Diagnoses/Problem List:  Patient Active Problem List   Diagnosis Date Noted  . Cough   . Alcohol withdrawal seizure 04/13/2014  . Hypoxia   . Respiratory distress   . Bronchiectasis with acute exacerbation 03/04/2014  . Tachycardia 02/12/2014  . Hypokalemia 01/26/2014  . Alcohol withdrawal 01/25/2014  . SOB (shortness of breath)   . Chronic diastolic heart failure   . Intractable nausea and vomiting 01/23/2014  . Peripheral neuropathy 12/15/2013  . Diarrhea 11/28/2013  . ETOH abuse 11/18/2013  . Bronchiectasis without acute exacerbation 06/30/2013  . Lung nodule seen on imaging study 06/15/2013  . Chronic systolic heart failure 06/13/2013  . Transaminitis 06/13/2013  . RBBB 11/09/2012  . Hyponatremia 11/08/2012  . Seizure due to alcohol withdrawal 11/08/2012  . Umbilical hernia 08/06/2012  . Smoker unmotivated to quit 07/22/2012  . Tremor 07/30/2011  . HTN (hypertension) 07/30/2011  . Hepatomegaly 11/05/2010  . Depression 11/05/2010  . Anxiety 11/05/2010   Disposition: Home  Discharge Condition: Improved  Discharge Exam:  Filed Vitals:   04/17/14 1411  BP: 116/69  Pulse: 81  Temp: 98.6 F (37 C)  Resp: 18   General - oriented x3, cooperative. NAD HEENT - Head is normocephalic. EOMI. MMM, nasal congestion present.  Integument - intact. No rash, erythema, or ecchymoses.  Chest - poor expansion. Minimal wheezing in upper air lobes. Decreased air movement. Comfortable work of breathing. Cardiac - RRR. No murmurs noted,  Abdomen - obese, soft, nontender. Umbilical hernia  appreciated. Bowel sounds present. CNS - grossly intact Extremeties - no tenderness or effusions noted. No edema. ROM good. 5/5 bilateral strength. Dorsalis pedis pulses present and symmetrical.   Brief Hospital Course:  Juan [name] is a 64 y.o. male presenting with witnessed seizure and increased WOB. PMH is significant for alcoholism and alcoholism related seizures, CHF, anxiety, depression, HLD, HTN, asthma.  Seizure, in the setting of Alcohol abuse: Seizure likely due to alcohol withdrawal. Neg for alcohol in ED. CT with no acute intracranial findings. Patient did not have any more seizure like activity. He did not require prn ativan. Patient was offered again this admission treatment facilities to help with alcohol abuse but patient declined.   COPD:Presnented with respiratory distress and required 2L [place]. CXR with no acute process. ABG wnl. CTA negative for PE. Did not appear to be having an exacerbation. Home medications were continued with improvement.  The patient's other chronic conditions, including depression, HTN, dCHF, peripheral neuropathy were stable during this hospitalization and the patient was continued on home medications.   Issues for Follow Up:  1. Outpatient pulmonary function tests 2. Continue to encourage alcohol cessation and outpatient rehab  Significant Procedures: None  Significant Labs and Imaging:   Recent Labs Lab 04/15/14 0350 04/16/14 0541 04/17/14 0556  WBC 7.5 7.8 8.8  HGB 10.0* 10.5* 11.5*  HCT 29.1* 30.6* 34.0*  PLT 232 213 209    Recent Labs Lab 04/13/14 0153 04/13/14 1431 04/14/14 0547 04/15/14 0300 04/15/14 0350 04/16/14 0541 04/17/14 0556  NA 124* 132* 130*  --  130* 129* 129*  K 3.5 3.7 3.6  --  4.0 4.1 4.3  CL 86* 101 103  --  97 94* 92*  CO2 15* 24 24  --  GLUCOSE 225* 110* 98  --  110* 102* 109*  BUN <5* <5* 6  --  6 <5* 7  CREATININE 1.12 1.01 0.93  --  0.83 0.71 0.90  CALCIUM 9.8 7.6* 7.3*  --  7.7* 8.2*  8.7  MG  --   --   --  1.3*  --  1.7  --   ALKPHOS 106  --  89  --   --   --   --   AST 59*  --  37  --   --   --   --   ALT 30  --  20  --   --   --   --   ALBUMIN 3.6  --  2.7*  --   --   --   --    Dg Chest 2 View 04/13/2014 IMPRESSION: 1. Mild cardiomegaly, new/progressed from prior exam. 2. Hyperinflation. Probable bibasilar atelectasis.   Ct Angio Chest Pe W/cm &/or Wo Cm 04/13/2014 IMPRESSION: 1. No pulmonary embolus. 2. Chronic lung disease with bronchiectasis and coarse reticular opacities, most significant in the right upper lobe. This appears similar to prior exam. Mild emphysema. 3. Mild atherosclerosis. Coronary artery calcifications.   CT head (04/14/2013): No acute intracranial findings. Minimal chronic ischemic microvascular disease.Mild chronic sinus inflammatory disease.  Results/Tests Pending at Time of Discharge: None  Discharge Medications:    Medication List    TAKE these medications        acetaminophen 500 MG tablet  Commonly known as:  TYLENOL  Take 1,500 mg by mouth every 6 (six) hours as needed for headache (headache).     aspirin 81 MG EC tablet  Take 1 tablet (81 mg total) by mouth daily.     atorvastatin 40 MG tablet  Commonly known as:  LIPITOR  Take 1 tablet (40 mg total) by mouth daily at 6 PM.     B-Complex Caps  Take 1 capsule by mouth daily.     carvedilol 12.5 MG tablet  Commonly known as:  COREG  Take 1 tablet (12.5 mg total) by mouth 2 (two) times daily with a meal.     clonazePAM 0.5 MG tablet  Commonly known as:  KLONOPIN  Take 1 tablet (0.5 mg total) by mouth 2 (two) times daily as needed for anxiety.     COMBIVENT RESPIMAT 20-100 MCG/ACT Aers respimat  Generic drug:  Ipratropium-Albuterol  INHALE TWO PUFFS BY MOUTH EVERY FOUR HOURS AS NEEDED for wheezing     dicyclomine 20 MG tablet  Commonly known as:  BENTYL  Take 1 tablet (20 mg total) by mouth 3 (three) times daily before meals.     DULoxetine 30 MG capsule   Commonly known as:  CYMBALTA  Take 1 capsule (30 mg total) by mouth daily.     fluticasone 50 MCG/ACT nasal spray  Commonly known as:  FLONASE  Use two sprays in each nostril daily     furosemide 40 MG tablet  Commonly known as:  LASIX  Take 1 tablet (40 mg total) by mouth daily.     gabapentin 300 MG capsule  Commonly known as:  NEURONTIN  TAKE ONE CAPSULE BY MOUTH THREE TIMES DAILY     lisinopril 20 MG tablet  Commonly known as:  PRINIVIL,ZESTRIL  Take 0.5 tablets (10 mg total) by mouth daily.     Magnesium 500 MG Tabs  Take 500  mg by mouth daily.     montelukast 10 MG tablet  Commonly known as:  SINGULAIR  Take 1 tablet (10 mg total) by mouth at bedtime.     multivitamin with minerals Tabs tablet  Take 1 tablet by mouth daily.     potassium chloride SA 20 MEQ tablet  Commonly known as:  K-DUR,KLOR-CON  Take 2 tablets (40 mEq total) by mouth daily.     spironolactone 25 MG tablet  Commonly known as:  ALDACTONE  TAKE HALF TABLET BY MOUTH DAILY     tiotropium 18 MCG inhalation capsule  Commonly known as:  SPIRIVA  Place 1 capsule (18 mcg total) into inhaler and inhale daily.     triamcinolone 0.025 % ointment  Commonly known as:  KENALOG  Apply 1 application topically 2 (two) times daily.     Vitamin D (Ergocalciferol) 50000 UNITS Caps capsule  Commonly known as:  DRISDOL  Take 1 capsule (50,000 Units total) by mouth every 7 (seven) days.        Discharge Instructions: Please refer to Patient Instructions section of EMR for full details.  Patient was counseled important signs and symptoms that should prompt return to medical care, changes in medications, dietary instructions, activity restrictions, and follow up appointments.   Follow-Up Appointments:     Follow-up Information    Follow up with Maryjean Ka, MD. Go on 04/22/2014.   Specialty:  Family Medicine   Why:  @ 8:45am   Contact information:   [address] [place]  [address] [phone]       Pincus Large, DO 04/18/2014, 10:17 AM PGY-1, [facility]

## 2014-04-19 LAB — CULTURE, BLOOD (ROUTINE X 2)
CULTURE: NO GROWTH
Culture: NO GROWTH

## 2014-04-19 NOTE — Care Management Note (Signed)
    Page 1 of 1   04/19/2014     4:43:57 PM CARE MANAGEMENT NOTE 04/19/2014  Patient:  Juan [name], Juan [name]   Account Number:  [id number]  Date Initiated:  04/19/2014  Documentation initiated by:  Letha Cape  Subjective/Objective Assessment:   admit     Action/Plan:   pt eval- hhpt   Anticipated DC Date:  04/17/2014   Anticipated DC Plan:  HOME W HOME HEALTH SERVICES      DC Planning Services  CM consult      Choice offered to / List presented to:             Status of service:  Completed, signed off Medicare Important Message given?  NO (If response is "NO", the following Medicare IM given date fields will be blank) Date Medicare IM given:   Medicare IM given by:   Date Additional Medicare IM given:   Additional Medicare IM given by:    Discharge Disposition:  HOME/SELF CARE  Per UR Regulation:  Reviewed for med. necessity/level of care/duration of stay  If discussed at Long Length of Stay Meetings, dates discussed:    Comments:  04/19/14 1642 Letha Cape RN, BSN [phone] NCM noticed patient dc over the weekend and NCM tried to contact patient about hhpt, could not contact patient. Left message for patient.

## 2014-04-22 ENCOUNTER — Inpatient Hospital Stay: Payer: Self-pay | Admitting: Family Medicine

## 2014-05-09 ENCOUNTER — Inpatient Hospital Stay: Payer: Self-pay | Admitting: Family Medicine

## 2014-05-10 ENCOUNTER — Other Ambulatory Visit: Payer: Self-pay | Admitting: Family Medicine

## 2014-05-27 ENCOUNTER — Encounter: Payer: Self-pay | Admitting: Family Medicine

## 2014-05-27 ENCOUNTER — Ambulatory Visit (INDEPENDENT_AMBULATORY_CARE_PROVIDER_SITE_OTHER): Payer: No Typology Code available for payment source | Admitting: Family Medicine

## 2014-05-27 VITALS — BP 140/84 | HR 100 | Temp 98.3°F | Ht 68.0 in | Wt 188.0 lb

## 2014-05-27 DIAGNOSIS — J471 Bronchiectasis with (acute) exacerbation: Secondary | ICD-10-CM

## 2014-05-27 DIAGNOSIS — E871 Hypo-osmolality and hyponatremia: Secondary | ICD-10-CM

## 2014-05-27 DIAGNOSIS — F1010 Alcohol abuse, uncomplicated: Secondary | ICD-10-CM | POA: Diagnosis not present

## 2014-05-27 DIAGNOSIS — F1020 Alcohol dependence, uncomplicated: Secondary | ICD-10-CM

## 2014-05-27 LAB — COMPREHENSIVE METABOLIC PANEL
ALT: 21 U/L (ref 0–53)
AST: 34 U/L (ref 0–37)
Albumin: 3.8 g/dL (ref 3.5–5.2)
Alkaline Phosphatase: 119 U/L — ABNORMAL HIGH (ref 39–117)
BUN: 10 mg/dL (ref 6–23)
CO2: 25 meq/L (ref 19–32)
Calcium: 8.7 mg/dL (ref 8.4–10.5)
Chloride: 90 mEq/L — ABNORMAL LOW (ref 96–112)
Creat: 0.91 mg/dL (ref 0.50–1.35)
Glucose, Bld: 103 mg/dL — ABNORMAL HIGH (ref 70–99)
POTASSIUM: 4.6 meq/L (ref 3.5–5.3)
SODIUM: 126 meq/L — AB (ref 135–145)
TOTAL PROTEIN: 6.8 g/dL (ref 6.0–8.3)
Total Bilirubin: 0.5 mg/dL (ref 0.2–1.2)

## 2014-05-27 LAB — CBC
HCT: 36.9 % — ABNORMAL LOW (ref 39.0–52.0)
HEMOGLOBIN: 11.9 g/dL — AB (ref 13.0–17.0)
MCH: 31 pg (ref 26.0–34.0)
MCHC: 32.2 g/dL (ref 30.0–36.0)
MCV: 96.1 fL (ref 78.0–100.0)
MPV: 9.1 fL (ref 8.6–12.4)
Platelets: 266 10*3/uL (ref 150–400)
RBC: 3.84 MIL/uL — AB (ref 4.22–5.81)
RDW: 14.1 % (ref 11.5–15.5)
WBC: 8.5 10*3/uL (ref 4.0–10.5)

## 2014-05-27 MED ORDER — PREDNISONE 50 MG PO TABS: ORAL_TABLET | ORAL | Status: DC

## 2014-05-27 MED ORDER — IPRATROPIUM-ALBUTEROL 20-100 MCG/ACT IN AERS: INHALATION_SPRAY | RESPIRATORY_TRACT | Status: DC

## 2014-05-27 MED ORDER — DOXYCYCLINE HYCLATE 100 MG PO TABS: 100.0000 mg | ORAL_TABLET | Freq: Two times a day (BID) | ORAL | Status: DC

## 2014-05-27 NOTE — Assessment & Plan Note (Signed)
Exacerbation today.  As per usual, triggered by URI, developed LRTI. Prednisone and Doxycycline.  Knowing he has lung disease, I still opted for Doxy rather than fluoroquinolone.  He has done well outpt on Doxy previously and doesn't appear that ill today.

## 2014-05-27 NOTE — Assessment & Plan Note (Signed)
REchecking sodium today, as well as other electrolytes.

## 2014-05-27 NOTE — Patient Instructions (Signed)
I think you need to follow up with [facility], this is a great idea.  We are checking your sodium today.  Refill for Combivent.  Prednisone and Doxycycline   Come see me in a month or so to make sure you're doing well.

## 2014-05-27 NOTE — Progress Notes (Signed)
Subjective:    Juan [name] is a 64 y.o. male who presents to [facility] today for several issues:  1.  URI: Began having ear pain about 2 weeks ago, became sinus congestion and stuffiness the next day.  Transitioned to cough within a day or two after that.  Now with just cough.  Productive of thick green sputum.  Increased Combivent usage, he is almost out of this.  Every 4 hours or so.  No fevers or chills.    2.  Alcoholism:  He is attempting to gradually wean himself off of EtOH.  Started about a week ago, decreased his ounces daily.  Down to just a few three days ago.  None today or yesterday.  Still taking his Klonopin to prevent seizures, as needed if he feels shaky or withdrawal symptoms (he has had these multiple times and knows the signs he tells me).   ROS as above per HPI, otherwise neg.    The following portions of the patient's history were reviewed and updated as appropriate: allergies, current medications, past medical history, family and social history, and problem list. Patient is a nonsmoker.    PMH reviewed.  Past Medical History  Diagnosis Date  . CHF (congestive heart failure) 10/2010    ECHO:  EF 40%, Grade II diastolic dysfunction  . Alcoholism   . Allergy     seasonal   . Anxiety   . Depression   . Asthma   . Hyperlipidemia   . Hypertension   . Seizures    Past Surgical History  Procedure Laterality Date  . Removal of ingrown toenail      Medications reviewed. Current Outpatient Prescriptions  Medication Sig Dispense Refill  . acetaminophen (TYLENOL) 500 MG tablet Take 1,500 mg by mouth every 6 (six) hours as needed for headache (headache).     Marland Kitchen aspirin EC 81 MG EC tablet Take 1 tablet (81 mg total) by mouth daily. 90 tablet 0  . atorvastatin (LIPITOR) 40 MG tablet Take 1 tablet (40 mg total) by mouth daily at 6 PM. 30 tablet 6  . B-Complex CAPS Take 1 capsule by mouth daily.    . carvedilol (COREG) 12.5 MG tablet Take 1 tablet (12.5 mg total) by mouth 2  (two) times daily with a meal. 60 tablet 6  . clonazePAM (KLONOPIN) 0.5 MG tablet Take 1 tablet (0.5 mg total) by mouth 2 (two) times daily as needed for anxiety. (Patient taking differently: Take 0.5 mg by mouth 2 (two) times daily. ) 60 tablet 1  . COMBIVENT RESPIMAT 20-100 MCG/ACT AERS respimat INHALE TWO PUFFS BY MOUTH EVERY FOUR HOURS as needed for wheezing 4 g 1  . dicyclomine (BENTYL) 20 MG tablet Take 1 tablet (20 mg total) by mouth 3 (three) times daily before meals. 90 tablet 0  . DULoxetine (CYMBALTA) 30 MG capsule Take 1 capsule (30 mg total) by mouth daily. 30 capsule 3  . fluticasone (FLONASE) 50 MCG/ACT nasal spray Use two sprays in each nostril daily 16 g 11  . furosemide (LASIX) 40 MG tablet Take 1 tablet (40 mg total) by mouth daily. 30 tablet 6  . gabapentin (NEURONTIN) 300 MG capsule TAKE ONE CAPSULE BY MOUTH THREE TIMES DAILY  90 capsule 6  . lisinopril (PRINIVIL,ZESTRIL) 20 MG tablet Take 0.5 tablets (10 mg total) by mouth daily. 30 tablet 10  . Magnesium 500 MG TABS Take 500 mg by mouth daily.    . montelukast (SINGULAIR) 10 MG tablet Take 1  tablet (10 mg total) by mouth at bedtime. 30 tablet 3  . Multiple Vitamin (MULTIVITAMIN WITH MINERALS) TABS tablet Take 1 tablet by mouth daily.    . potassium chloride SA (K-DUR,KLOR-CON) 20 MEQ tablet Take 2 tablets (40 mEq total) by mouth daily. 60 tablet 6  . spironolactone (ALDACTONE) 25 MG tablet TAKE HALF TABLET BY MOUTH DAILY  15 tablet 5  . tiotropium (SPIRIVA) 18 MCG inhalation capsule Place 1 capsule (18 mcg total) into inhaler and inhale daily. 30 capsule 6  . triamcinolone (KENALOG) 0.025 % ointment Apply 1 application topically 2 (two) times daily. (Patient taking differently: Apply 1 application topically 2 (two) times daily as needed (for rash). ) 30 g 0  . Vitamin D, Ergocalciferol, (DRISDOL) 50000 UNITS CAPS capsule Take 1 capsule (50,000 Units total) by mouth every 7 (seven) days. 6 capsule 0  . [DISCONTINUED] albuterol  (PROVENTIL,VENTOLIN) 90 MCG/ACT inhaler Inhale 2 puffs into the lungs every 6 (six) hours as needed for wheezing. 17 g 0   No current facility-administered medications for this visit.     Objective:   Physical Exam BP 140/84 mmHg  Pulse 100  Temp(Src) 98.3 F (36.8 C) (Oral)  Ht  (1.727 m)  Wt 188 lb (85.276 kg)  BMI 28.59 kg/m2 Gen:  Alert, cooperative patient who appears stated age in no acute distress.  Vital signs reviewed. HEENT: EOMI,  MMM Cardiac:  Minimally tachycardic but regular rhythm Pulm:  Wheezing present BL bases. Poor air movement at bases, but this is more his usual presentation.   Abd:  Hernia noted.  No signs of infection.  Exts: Trace edema BL ankles Skin:  No rash BL lower extremities today.  Some scarring from his prior rashes.   Neuro:  Awake, alert, conversant.  Oriented x 4.  No focal deficits.    No results found for this or any previous visit (from the past 72 hour(s)).

## 2014-05-27 NOTE — Assessment & Plan Note (Addendum)
I am concerned he is starting to withdraw.  He is borderline tachycardic today.  We discussed this at length as he has a hx of WD seizuresi.  He has plenty of Klonopin at home he assures me.ut  I discussed with him about seeing our in-house Visual merchandiser.  He declined this service.   He said that he lives down the street from [facility], and assures me that he will obtain care there to help with alcohol cessation and prevent withdrawal.   When I mentioned that he has stated this repeatedly previously and that it's very hard to cease from EtOH on one's own, he stated that his wife retires at the end of the month (2 weeks) and will be available to take him to weekly appts if need be.   Ultimately, he has capacity to make his own decisions, however unsafe they may be.  Dnies any SI/HI or even passive death wish.   FU with me in 1 month to assess for improvement -- he should have started his sessions at [facility] by then.  Sooner if worsening.

## 2014-05-28 ENCOUNTER — Encounter: Payer: Self-pay | Admitting: Family Medicine

## 2014-06-02 ENCOUNTER — Other Ambulatory Visit: Payer: Self-pay | Admitting: Family Medicine

## 2014-06-23 ENCOUNTER — Emergency Department (HOSPITAL_COMMUNITY): Payer: No Typology Code available for payment source

## 2014-06-23 ENCOUNTER — Encounter (HOSPITAL_COMMUNITY): Payer: Self-pay | Admitting: Emergency Medicine

## 2014-06-23 ENCOUNTER — Inpatient Hospital Stay (HOSPITAL_COMMUNITY)
Admission: EM | Admit: 2014-06-23 | Discharge: 2014-06-30 | DRG: 897 | Disposition: A | Discharge status: Home / Self Care | Payer: No Typology Code available for payment source | Attending: Family Medicine | Admitting: Family Medicine

## 2014-06-23 ENCOUNTER — Telehealth: Payer: Self-pay | Admitting: *Deleted

## 2014-06-23 DIAGNOSIS — R059 Cough, unspecified: Secondary | ICD-10-CM

## 2014-06-23 DIAGNOSIS — E872 Acidosis, unspecified: Secondary | ICD-10-CM | POA: Insufficient documentation

## 2014-06-23 DIAGNOSIS — E871 Hypo-osmolality and hyponatremia: Secondary | ICD-10-CM | POA: Diagnosis present

## 2014-06-23 DIAGNOSIS — J449 Chronic obstructive pulmonary disease, unspecified: Secondary | ICD-10-CM | POA: Insufficient documentation

## 2014-06-23 DIAGNOSIS — F10939 Alcohol use, unspecified with withdrawal, unspecified: Secondary | ICD-10-CM | POA: Insufficient documentation

## 2014-06-23 DIAGNOSIS — J441 Chronic obstructive pulmonary disease with (acute) exacerbation: Secondary | ICD-10-CM | POA: Diagnosis present

## 2014-06-23 DIAGNOSIS — Z9114 Patient's other noncompliance with medication regimen: Secondary | ICD-10-CM | POA: Diagnosis present

## 2014-06-23 DIAGNOSIS — R05 Cough: Secondary | ICD-10-CM

## 2014-06-23 DIAGNOSIS — E860 Dehydration: Secondary | ICD-10-CM | POA: Diagnosis present

## 2014-06-23 DIAGNOSIS — I5022 Chronic systolic (congestive) heart failure: Secondary | ICD-10-CM | POA: Diagnosis present

## 2014-06-23 DIAGNOSIS — F10129 Alcohol abuse with intoxication, unspecified: Principal | ICD-10-CM | POA: Diagnosis present

## 2014-06-23 DIAGNOSIS — Z7982 Long term (current) use of aspirin: Secondary | ICD-10-CM

## 2014-06-23 DIAGNOSIS — G629 Polyneuropathy, unspecified: Secondary | ICD-10-CM | POA: Diagnosis present

## 2014-06-23 DIAGNOSIS — F419 Anxiety disorder, unspecified: Secondary | ICD-10-CM | POA: Diagnosis present

## 2014-06-23 DIAGNOSIS — F329 Major depressive disorder, single episode, unspecified: Secondary | ICD-10-CM | POA: Diagnosis present

## 2014-06-23 DIAGNOSIS — R000 Tachycardia, unspecified: Secondary | ICD-10-CM | POA: Diagnosis present

## 2014-06-23 DIAGNOSIS — Z7952 Long term (current) use of systemic steroids: Secondary | ICD-10-CM

## 2014-06-23 DIAGNOSIS — J45909 Unspecified asthma, uncomplicated: Secondary | ICD-10-CM | POA: Diagnosis present

## 2014-06-23 DIAGNOSIS — I10 Essential (primary) hypertension: Secondary | ICD-10-CM | POA: Diagnosis present

## 2014-06-23 DIAGNOSIS — F17210 Nicotine dependence, cigarettes, uncomplicated: Secondary | ICD-10-CM | POA: Diagnosis present

## 2014-06-23 DIAGNOSIS — Z9181 History of falling: Secondary | ICD-10-CM | POA: Diagnosis not present

## 2014-06-23 DIAGNOSIS — F1010 Alcohol abuse, uncomplicated: Secondary | ICD-10-CM | POA: Diagnosis not present

## 2014-06-23 DIAGNOSIS — R531 Weakness: Secondary | ICD-10-CM | POA: Insufficient documentation

## 2014-06-23 DIAGNOSIS — K760 Fatty (change of) liver, not elsewhere classified: Secondary | ICD-10-CM | POA: Diagnosis present

## 2014-06-23 DIAGNOSIS — F10239 Alcohol dependence with withdrawal, unspecified: Secondary | ICD-10-CM | POA: Diagnosis not present

## 2014-06-23 DIAGNOSIS — E785 Hyperlipidemia, unspecified: Secondary | ICD-10-CM | POA: Diagnosis present

## 2014-06-23 DIAGNOSIS — Z79899 Other long term (current) drug therapy: Secondary | ICD-10-CM

## 2014-06-23 DIAGNOSIS — W19XXXA Unspecified fall, initial encounter: Secondary | ICD-10-CM | POA: Insufficient documentation

## 2014-06-23 HISTORY — DX: Chronic obstructive pulmonary disease, unspecified: J44.9

## 2014-06-23 LAB — CBC WITH DIFFERENTIAL/PLATELET
BASOS ABS: 0 10*3/uL (ref 0.0–0.1)
Basophils Relative: 1 % (ref 0–1)
EOS ABS: 0 10*3/uL (ref 0.0–0.7)
Eosinophils Relative: 1 % (ref 0–5)
HEMATOCRIT: 40.4 % (ref 39.0–52.0)
Hemoglobin: 13.8 g/dL (ref 13.0–17.0)
Lymphocytes Relative: 21 % (ref 12–46)
Lymphs Abs: 1.3 10*3/uL (ref 0.7–4.0)
MCH: 31.2 pg (ref 26.0–34.0)
MCHC: 34.2 g/dL (ref 30.0–36.0)
MCV: 91.4 fL (ref 78.0–100.0)
Monocytes Absolute: 0.7 10*3/uL (ref 0.1–1.0)
Monocytes Relative: 10 % (ref 3–12)
NEUTROS ABS: 4.2 10*3/uL (ref 1.7–7.7)
Neutrophils Relative %: 67 % (ref 43–77)
Platelets: 201 10*3/uL (ref 150–400)
RBC: 4.42 MIL/uL (ref 4.22–5.81)
RDW: 14.5 % (ref 11.5–15.5)
WBC: 6.3 10*3/uL (ref 4.0–10.5)

## 2014-06-23 LAB — I-STAT CG4 LACTIC ACID, ED: Lactic Acid, Venous: 5.56 mmol/L (ref 0.5–2.0)

## 2014-06-23 LAB — BRAIN NATRIURETIC PEPTIDE: B Natriuretic Peptide: 15.1 pg/mL (ref 0.0–100.0)

## 2014-06-23 LAB — COMPREHENSIVE METABOLIC PANEL
ALK PHOS: 150 U/L — AB (ref 38–126)
ALT: 88 U/L — ABNORMAL HIGH (ref 17–63)
ANION GAP: 17 — AB (ref 5–15)
AST: 210 U/L — ABNORMAL HIGH (ref 15–41)
Albumin: 3.1 g/dL — ABNORMAL LOW (ref 3.5–5.0)
BUN: 9 mg/dL (ref 6–20)
CO2: 21 mmol/L — AB (ref 22–32)
Calcium: 8 mg/dL — ABNORMAL LOW (ref 8.9–10.3)
Chloride: 92 mmol/L — ABNORMAL LOW (ref 101–111)
Creatinine, Ser: 0.85 mg/dL (ref 0.61–1.24)
GLUCOSE: 143 mg/dL — AB (ref 65–99)
POTASSIUM: 4.4 mmol/L (ref 3.5–5.1)
Sodium: 130 mmol/L — ABNORMAL LOW (ref 135–145)
TOTAL PROTEIN: 6.6 g/dL (ref 6.5–8.1)
Total Bilirubin: 0.8 mg/dL (ref 0.3–1.2)

## 2014-06-23 LAB — I-STAT TROPONIN, ED: TROPONIN I, POC: 0.01 ng/mL (ref 0.00–0.08)

## 2014-06-23 LAB — PHOSPHORUS: Phosphorus: 3.6 mg/dL (ref 2.5–4.6)

## 2014-06-23 LAB — MRSA PCR SCREENING: MRSA by PCR: NEGATIVE

## 2014-06-23 LAB — MAGNESIUM: Magnesium: 2 mg/dL (ref 1.7–2.4)

## 2014-06-23 LAB — ETHANOL: ALCOHOL ETHYL (B): 333 mg/dL — AB (ref <5)

## 2014-06-23 MED ORDER — LORAZEPAM 0.5 MG PO TABS
1.0000 mg | ORAL_TABLET | Freq: Four times a day (QID) | ORAL | Status: DC | PRN
  Administered 2014-06-23 – 2014-06-24 (×2): 1 mg via ORAL
  Filled 2014-06-23 (×2): qty 1

## 2014-06-23 MED ORDER — LORAZEPAM 1 MG PO TABS: 0.0000 mg | ORAL_TABLET | Freq: Two times a day (BID) | ORAL | Status: DC

## 2014-06-23 MED ORDER — THIAMINE HCL 100 MG/ML IJ SOLN
100.0000 mg | Freq: Once | INTRAMUSCULAR | Status: AC
  Administered 2014-06-23: 100 mg via INTRAVENOUS
  Filled 2014-06-23: qty 2

## 2014-06-23 MED ORDER — THIAMINE HCL 100 MG/ML IJ SOLN
100.0000 mg | Freq: Every day | INTRAMUSCULAR | Status: DC
  Filled 2014-06-23: qty 1

## 2014-06-23 MED ORDER — LORAZEPAM 2 MG/ML IJ SOLN: 0.0000 mg | Freq: Two times a day (BID) | INTRAMUSCULAR | Status: DC

## 2014-06-23 MED ORDER — ONDANSETRON HCL 4 MG/2ML IJ SOLN
4.0000 mg | Freq: Three times a day (TID) | INTRAMUSCULAR | Status: DC | PRN
  Administered 2014-06-23: 4 mg via INTRAVENOUS
  Filled 2014-06-23: qty 2

## 2014-06-23 MED ORDER — SODIUM CHLORIDE 0.9 % IV SOLN
INTRAVENOUS | Status: DC
  Administered 2014-06-23: 23:00:00 via INTRAVENOUS

## 2014-06-23 MED ORDER — DULOXETINE HCL 30 MG PO CPEP
30.0000 mg | ORAL_CAPSULE | Freq: Every day | ORAL | Status: DC
  Administered 2014-06-23 – 2014-06-30 (×8): 30 mg via ORAL
  Filled 2014-06-23 (×8): qty 1

## 2014-06-23 MED ORDER — VITAMIN B-1 100 MG PO TABS: 100.0000 mg | ORAL_TABLET | Freq: Every day | ORAL | Status: DC

## 2014-06-23 MED ORDER — SODIUM CHLORIDE 0.9 % IJ SOLN
3.0000 mL | Freq: Two times a day (BID) | INTRAMUSCULAR | Status: DC
  Administered 2014-06-23 – 2014-06-25 (×5): 3 mL via INTRAVENOUS
  Administered 2014-06-26: 10 mL via INTRAVENOUS
  Administered 2014-06-26 – 2014-06-30 (×4): 3 mL via INTRAVENOUS

## 2014-06-23 MED ORDER — SODIUM CHLORIDE 0.9 % IV SOLN
INTRAVENOUS | Status: DC
  Administered 2014-06-23: 19:00:00 via INTRAVENOUS

## 2014-06-23 MED ORDER — ENOXAPARIN SODIUM 40 MG/0.4ML ~~LOC~~ SOLN
40.0000 mg | SUBCUTANEOUS | Status: DC
  Administered 2014-06-24 – 2014-06-29 (×7): 40 mg via SUBCUTANEOUS
  Filled 2014-06-23 (×8): qty 0.4

## 2014-06-23 MED ORDER — LORAZEPAM 2 MG/ML IJ SOLN
0.0000 mg | Freq: Four times a day (QID) | INTRAMUSCULAR | Status: DC
  Administered 2014-06-23: 2 mg via INTRAVENOUS
  Filled 2014-06-23: qty 1

## 2014-06-23 MED ORDER — SODIUM CHLORIDE 0.9 % IV BOLUS (SEPSIS)
1000.0000 mL | Freq: Once | INTRAVENOUS | Status: AC
  Administered 2014-06-23: 1000 mL via INTRAVENOUS

## 2014-06-23 MED ORDER — ADULT MULTIVITAMIN W/MINERALS CH
1.0000 | ORAL_TABLET | Freq: Every day | ORAL | Status: DC
  Administered 2014-06-24 – 2014-06-30 (×7): 1 via ORAL
  Filled 2014-06-23 (×7): qty 1

## 2014-06-23 MED ORDER — LORAZEPAM 2 MG/ML IJ SOLN
1.0000 mg | Freq: Four times a day (QID) | INTRAMUSCULAR | Status: DC | PRN
  Filled 2014-06-23: qty 1

## 2014-06-23 MED ORDER — FOLIC ACID 1 MG PO TABS
1.0000 mg | ORAL_TABLET | Freq: Every day | ORAL | Status: DC
  Administered 2014-06-24 – 2014-06-30 (×7): 1 mg via ORAL
  Filled 2014-06-23 (×7): qty 1

## 2014-06-23 MED ORDER — SODIUM CHLORIDE 0.9 % IV BOLUS (SEPSIS)
1500.0000 mL | Freq: Once | INTRAVENOUS | Status: AC
  Administered 2014-06-23: 1500 mL via INTRAVENOUS

## 2014-06-23 MED ORDER — CLONAZEPAM 0.5 MG PO TABS
0.5000 mg | ORAL_TABLET | Freq: Two times a day (BID) | ORAL | Status: DC | PRN
  Administered 2014-06-24: 0.5 mg via ORAL
  Filled 2014-06-23: qty 1

## 2014-06-23 MED ORDER — IPRATROPIUM BROMIDE 0.02 % IN SOLN
0.5000 mg | Freq: Once | RESPIRATORY_TRACT | Status: AC
  Administered 2014-06-23: 0.5 mg via RESPIRATORY_TRACT
  Filled 2014-06-23: qty 2.5

## 2014-06-23 MED ORDER — ALBUTEROL SULFATE (2.5 MG/3ML) 0.083% IN NEBU
5.0000 mg | INHALATION_SOLUTION | Freq: Once | RESPIRATORY_TRACT | Status: AC
  Administered 2014-06-23: 5 mg via RESPIRATORY_TRACT
  Filled 2014-06-23: qty 6

## 2014-06-23 MED ORDER — LORAZEPAM 1 MG PO TABS: 0.0000 mg | ORAL_TABLET | Freq: Four times a day (QID) | ORAL | Status: DC

## 2014-06-23 NOTE — ED Provider Notes (Addendum)
CSN: [id number]     Arrival date & time 06/23/14  1620 History   First MD Initiated Contact with Patient 06/23/14 1621     Chief Complaint  Patient presents with  . Shortness of Breath     (Consider location/radiation/quality/duration/timing/severity/associated sxs/prior Treatment) Patient is a 64 y.o. male presenting with cough. The history is provided by the patient. The history is limited by the condition of the patient (poor historian and some degree of intoxication).  Cough Cough characteristics:  Productive Sputum characteristics:  Nondescript Severity:  Moderate Onset quality:  Gradual Duration: unknown. Timing:  Constant Progression:  Unable to specify Chronicity:  Chronic Smoker: yes   Context: weather changes   Context: not sick contacts and not upper respiratory infection   Relieved by:  Beta-agonist inhaler Worsened by:  Nothing tried Ineffective treatments:  None tried Associated symptoms: chest pain, shortness of breath and wheezing   Associated symptoms: no chills, no fever, no rhinorrhea, no sinus congestion and no sore throat   Associated symptoms comment:  Was drinking alcohol today but states he drinks every day and having some pain with deep breaths.  Wife called due to concern that pt fell.  Pt denies falling but states he is tired of being sick   Past Medical History  Diagnosis Date  . CHF (congestive heart failure) 10/2010    ECHO:  EF 40%, Grade II diastolic dysfunction  . Alcoholism   . Allergy     seasonal   . Anxiety   . Depression   . Asthma   . Hyperlipidemia   . Hypertension   . Seizures   . COPD (chronic obstructive pulmonary disease)    Past Surgical History  Procedure Laterality Date  . Removal of ingrown toenail     Family History  Problem Relation Age of Onset  . Alzheimer's disease Mother   . Diabetes Mother   . Heart disease Maternal Uncle    History  Substance Use Topics  . Smoking status: Current Every Day Smoker -- 1.25  packs/day for 40 years    Types: Cigarettes  . Smokeless tobacco: Never Used     Comment: a little more than 1 ppd (02/17/14)  . Alcohol Use: 0.0 oz/week    0 Standard drinks or equivalent per week     Comment: 4-5 burbon's a day for the past 15 years - 02/17/13 (enough alcohol to maintain not having seizures)    Review of Systems  Constitutional: Negative for fever and chills.  HENT: Negative for rhinorrhea and sore throat.   Respiratory: Positive for cough, shortness of breath and wheezing.   Cardiovascular: Positive for chest pain.  All other systems reviewed and are negative.     Allergies  Review of patient's allergies indicates no known allergies.  Home Medications   Prior to Admission medications   Medication Sig Start Date End Date Taking? Authorizing Provider  acetaminophen (TYLENOL) 500 MG tablet Take 1,500 mg by mouth every 6 (six) hours as needed for headache (headache).     Historical Provider, MD  aspirin EC 81 MG EC tablet Take 1 tablet (81 mg total) by mouth daily. 11/13/12   Renae Fickle, MD  atorvastatin (LIPITOR) 40 MG tablet Take 1 tablet (40 mg total) by mouth daily at 6 PM. 03/17/14   Chrystie Nose, MD  B-Complex CAPS Take 1 capsule by mouth daily.    Historical Provider, MD  carvedilol (COREG) 12.5 MG tablet Take 1 tablet (12.5 mg total) by mouth 2 (  two) times daily with a meal. 02/17/14   Chrystie Nose, MD  clonazePAM (KLONOPIN) 0.5 MG tablet Take 1 tablet (0.5 mg total) by mouth 2 (two) times daily as needed for anxiety. Patient taking differently: Take 0.5 mg by mouth 2 (two) times daily.  03/04/14   Tobey Grim, MD  dicyclomine (BENTYL) 20 MG tablet Take 1 tablet (20 mg total) by mouth 3 (three) times daily before meals. 04/17/14   Kathee Delton, MD  doxycycline (VIBRA-TABS) 100 MG tablet Take 1 tablet (100 mg total) by mouth 2 (two) times daily. 05/27/14   Tobey Grim, MD  DULoxetine (CYMBALTA) 30 MG capsule Take 1 capsule (30 mg total) by mouth  daily. 12/14/13   Tobey Grim, MD  fluticasone Aleda Grana) 50 MCG/ACT nasal spray Use two sprays in each nostril daily 11/08/13   Tobey Grim, MD  furosemide (LASIX) 40 MG tablet Take 1 tablet (40 mg total) by mouth daily. 03/17/14   Chrystie Nose, MD  gabapentin (NEURONTIN) 300 MG capsule TAKE ONE CAPSULE BY MOUTH THREE TIMES DAILY  03/17/14   Tobey Grim, MD  Ipratropium-Albuterol (COMBIVENT RESPIMAT) 20-100 MCG/ACT AERS respimat INHALE TWO PUFFS BY MOUTH EVERY FOUR HOURS as needed for wheezing 05/27/14   Tobey Grim, MD  lisinopril (PRINIVIL,ZESTRIL) 20 MG tablet Take 0.5 tablets (10 mg total) by mouth daily. 04/17/14   Kathee Delton, MD  Magnesium 500 MG TABS Take 500 mg by mouth daily.    Historical Provider, MD  montelukast (SINGULAIR) 10 MG tablet Take 1 tablet (10 mg total) by mouth at bedtime. 10/26/13   Tobey Grim, MD  Multiple Vitamin (MULTIVITAMIN WITH MINERALS) TABS tablet Take 1 tablet by mouth daily.    Historical Provider, MD  potassium chloride SA (K-DUR,KLOR-CON) 20 MEQ tablet Take 2 tablets (40 mEq total) by mouth daily. 03/17/14   Chrystie Nose, MD  predniSONE (DELTASONE) 50 MG tablet Take 1 tab daily x 5 days 05/27/14   Tobey Grim, MD  spironolactone (ALDACTONE) 25 MG tablet TAKE HALF TABLET BY MOUTH DAILY  03/17/14   Chrystie Nose, MD  tiotropium (SPIRIVA) 18 MCG inhalation capsule Place 1 capsule (18 mcg total) into inhaler and inhale daily. 03/16/14   Tobey Grim, MD  triamcinolone (KENALOG) 0.025 % ointment Apply 1 application topically 2 (two) times daily. Patient taking differently: Apply 1 application topically 2 (two) times daily as needed (for rash).  10/26/13   Tobey Grim, MD  Vitamin D, Ergocalciferol, (DRISDOL) 50000 UNITS CAPS capsule TAKE 1 CAPSULE BY MOUTH EVERY 7 DAYS. 06/06/14   Tobey Grim, MD   BP 119/92 mmHg  Pulse 137  Temp(Src) 97.6 F (36.4 C) (Oral)  Resp 21  SpO2 95% Physical Exam  Constitutional: He is  oriented to person, place, and time. He appears well-developed and well-nourished. No distress.  Disheveled  HENT:  Head: Normocephalic and atraumatic.  Mouth/Throat: Oropharynx is clear and moist. Mucous membranes are dry. Abnormal dentition. Dental caries present.  Gingivitis, poor dentition.  Teeth are black and brown  Eyes: Conjunctivae and EOM are normal. Pupils are equal, round, and reactive to light.  Neck: Normal range of motion. Neck supple.  Cardiovascular: Regular rhythm and intact distal pulses.  Tachycardia present.   No murmur heard. Pulmonary/Chest: Effort normal. No accessory muscle usage. Tachypnea noted. No respiratory distress. He has wheezes. He has rhonchi. He has rales.  Abdominal: Soft. He exhibits no distension. There is no  tenderness. There is no rebound and no guarding.  Large umbilical hernia nontender  Musculoskeletal: Normal range of motion. He exhibits no edema or tenderness.  Neurological: He is alert and oriented to person, place, and time.  Skin: Skin is warm and dry. No rash noted. No erythema.  Psychiatric: He has a normal mood and affect. His behavior is normal.  Nursing note and vitals reviewed.   ED Course  Procedures (including critical care time) Labs Review Labs Reviewed  COMPREHENSIVE METABOLIC PANEL - Abnormal; Notable for the following:    Sodium 130 (*)    Chloride 92 (*)    CO2 21 (*)    Glucose, Bld 143 (*)    Calcium 8.0 (*)    Albumin 3.1 (*)    AST 210 (*)    ALT 88 (*)    Alkaline Phosphatase 150 (*)    Anion gap 17 (*)    All other components within normal limits  ETHANOL - Abnormal; Notable for the following:    Alcohol, Ethyl (B) 333 (*)    All other components within normal limits  I-STAT CG4 LACTIC ACID, ED - Abnormal; Notable for the following:    Lactic Acid, Venous 5.56 (*)    All other components within normal limits  CBC WITH DIFFERENTIAL/PLATELET  BRAIN NATRIURETIC PEPTIDE  URINALYSIS, ROUTINE W REFLEX  MICROSCOPIC  MAGNESIUM  PHOSPHORUS  I-STAT TROPOININ, ED    Imaging Review Dg Chest 2 View  06/23/2014   CLINICAL DATA:  Shortness of breath. Increase congestion. Chronic cough. History of COPD and heart failure.  EXAM: CHEST  2 VIEW  COMPARISON:  04/13/2014; 02/12/2014; chest CT - 04/13/2014  FINDINGS: Grossly unchanged cardiac silhouette and mediastinal contours. Improved aeration of lungs with minimal residual left basilar linear heterogeneous opacities favored to represent atelectasis. No new focal airspace opacities. No pleural effusion or pneumothorax. No evidence of edema. Unchanged mild (approximately 25%) compression deformity of a lower thoracic vertebral body. Stigmata of DISH within the thoracic spine.  IMPRESSION: No acute cardiopulmonary disease.   Electronically Signed   By: Simonne Come M.D.   On: 06/23/2014 17:40     EKG Interpretation   Date/Time:  Thursday Jun 23 2014 16:24:48 EDT Ventricular Rate:  138 PR Interval:  92 QRS Duration: 132 QT Interval:  337 QTC Calculation: 511 R Axis:   -125 Text Interpretation:  Sinus tachycardia Multiform ventricular premature  complexes Aberrant complex Right bundle branch block No significant change  since last tracing Confirmed by Anitra Lauth  MD, Alphonzo Lemmings ([id number]) on 06/23/2014  4:31:31 PM      MDM   Final diagnoses:  Cough  Dehydration  Alcohol abuse  Tachycardia    Patient being brought in by EMS for a possible fall. Patient's wife called the ambulance because she felt like her husband fell today when he was walking in the house. He commonly falls in this area of the hospital because there is a step up and he will misstep. Patient denies falling however did state that he was having some pain with taking deep breath and productive chronic cough and maybe some mild shortness of breath. Patient denies any nausea, vomiting or fever. Initially EMS put patient on CPAP and gave him nitroglycerin however patient denies having any chest  pain or trouble breathing at this time and CPAP was discontinued. Patient has bilateral rales, rhonchi and wheezing. He is tachycardic but has a normal blood pressure and oxygen saturation. He is disheveled and smells of alcohol. He states  that he has been drinking today and he drinks every day. He denies feeling intoxicated at this time. No significant distal edema or abdominal pain. He has a large umbilical hernia that is nontender.  Concern for this patient with a COPD exacerbation versus pneumonia versus aspiration pneumonia from being a chronic drinker versus CHF as he does have a history.  Patient denies any seizures today or suicidal, homicidal ideation.  EKG was sinus tachycardia without acute changes. Patient given albuterol and Atrovent. Placed on nasal cannula.  CBC, CMP, troponin, BNP, EtOH, UA, lactic acid pending   6:46 PM Patient was wife arrived and gives a more thorough history. For the last week patient has been drinking alcohol more excessively he has had multiple falls is becoming more weak and intermittently may have complained of worsening productive cough. He has elevation of his liver enzymes, hypocalcemia, low albumin, mild hyponatremia and a lactic acid of 5.5. X-ray without acute findings. Patient given IV fluids and thiamine. He has no evidence of CHF at this time. Will admit patient to family medicine for further care.  Gwyneth Sprout, MD 06/23/14 [id number]  Gwyneth Sprout, MD 06/23/14 [phone]

## 2014-06-23 NOTE — ED Notes (Signed)
Per EMS- pt states it "hurts to breath"; 96% on RA, placed on CPAP- at 100%. Productive cough, states this is chronic. CHF, COPD hx. A/o x4. vss

## 2014-06-23 NOTE — Progress Notes (Signed)
New Admission Note: Pt transferred to unit from the [facility] ED  Arrival Method: Stretcher  Mental Orientation: Alert and oriented x4 Telemetry: awaiting orders Assessment:  To be be Completed Skin: Intact IV: R Fa Pain: Denies Tubes: None Safety Measures: Safety Fall Prevention Plan in place Admission: To be completed 6 [place] Orientation: Patient has been orientated to the room, unit and staff.  Family: None at bedside  MD to bedside for assessment. Will continue to monitor the patient. Call light has been placed within reach and bed alarm has been activated.   Burley Saver, RN-BC Phone: [phone]

## 2014-06-23 NOTE — Telephone Encounter (Signed)
[name] called stating that pt has been drinking a lot.  Pt has had 1/2 gallon of bourbon and coke colayesterday with 24 hours.  Pt also had alcohol today, but unsure of the amount.  Pt drinks at least 6 cans of Coke Cola a day along with what is mixed the the alcohol.  Pt does not eat a lot per wife.  Mrs. Simeon Craft called EMS to transport pt to ED.  She found patient laying on the front pouch.  She also stated she fell last night and crawled to the couch.  PCP is aware of situation.  Clovis Pu, RN

## 2014-06-23 NOTE — ED Notes (Signed)
Patient back from radiology.

## 2014-06-23 NOTE — H&P (Signed)
Family Medicine Teaching [facility] Admission History and Physical Service Pager: [phone]  Patient name: Juan [name] Medical record number: [id number] Date of birth: [birth year] Age: 64 y.o. Gender: male  Primary Care Provider: Renold Don, MD Consultants: None Code Status: Full  Chief Complaint: Falls  Assessment and Plan: Juan [name] is a 64 y.o. male presenting with increasing falls and dehydration in the setting of an alcohol binge. PMH is significant for alcohol abuse, history of alcohol withdrawal seizures, CHF, anxiety, depression, HLD, HTN, asthma  Falls / Tachycardia / Alcohol abuse. Tachycardic to 120s and lactate of 5.56 on admission, likely secondary to dehydration due to current alcohol binge. Does not meet SIRS criteria and has no signs of infection. Falls likely due to intoxication. Has strong history of alcohol abuse and was admitted 3 months ago for seizure secondary to withdrawals. Last drank today. - Admit to telemetry under attending Dr Lum Babe - s/p 1.5L bolus in ED- still very tachycardic with orthostatic symptoms, so will give additional bolus (most recent echo with normal EF) - IVF: NS /hr - Trend lactic acid - CIWA - Patient interested in stopping alcohol abuse, consider social work consult for detox options  Mixed anion gap and mild acidosis: Partially due to lactate elevation (with no signs of systemic infection / shock) and elevated ethanol level as well as alcoholic ketoacidosis.  - Monitor with BMP in AM for gap closure and trend lactate.   COPD with history of aspiration pneumonia. Currently stable on room air. CXR negative. Does have increased sputum recently, but no signs of acute exacerbation. Not on home oxygen. Not treating as exacerbation at admission.  - Continue home spiriva, combivent, and singulair - Supplemental oxygen as needed to keep sats >88% - Low threshold to start antibiotics and systemic steroids if develops symptoms  of acute exacerbation. (would need to cover anaerobes given history of aspiration)  Hepatic steatosis: Noted on Abdominal U/S. Now with elevated hepatic enzymes consistent with alcohol abuse (AST 210, ALT 88). LFTs normal last month.  - Continue to monitor  - Hep Panel (A, B, and C) negative March 2015 - Check coags in AM (historically normal)  History of HFpEF / HTN / HLD. Echo 04/2014 with EF 55-60%. Does not appear to be volume overloaded. Currently near dry weight (184-188lb). BNP 15.1.  - Hold home lasix and spironolactone in setting of dehydration - Continue ASA, coreg - Continue home lisinopril - Continue home statin - Will get 1 additional troponin for rule out.   Peripheral Neuropathy - Continue home gabapentin  Anxiety/depression - Continue home Cymbalta - Holding home klonopin - on CIWA  Tobacco Abuse - Cessation counseling  FEN/GI: Heart healthy diet, NS@125c /hr Prophylaxis: lovenox  Disposition: Admitted pending above work up.  History of Present Illness: Juan [name] is a 64 y.o. male presenting with increasing falls and weakness in the setting of an alcohol binge.   Patient states that he has been drinking about half a gallon of bourbon daily. Last drank today. Also reports that he has had increasing falls and weakness. Attributes these symptoms to alcohol use. Describes occasional vertigo. States that he has not had much to eat or drink recently.   Also reports some shortness of breath that is at his baseline, however has been coughing more with increased sputum production. Endorses night sweats and "weight loss" though has not weighed himself at home. No chest pain.  Review Of Systems: Per HPI, otherwise 12 point review of systems was performed  and was unremarkable.  Patient Active Problem List   Diagnosis Date Noted  . Dehydration 06/23/2014  . Cough   . Bronchiectasis with acute exacerbation 03/04/2014  . Tachycardia 02/12/2014  . Hypokalemia  01/26/2014  . Chronic diastolic heart failure   . Peripheral neuropathy 12/15/2013  . ETOH abuse 11/18/2013  . Bronchiectasis without acute exacerbation 06/30/2013  . Lung nodule seen on imaging study 06/15/2013  . Chronic systolic heart failure 06/13/2013  . Transaminitis 06/13/2013  . RBBB 11/09/2012  . Hyponatremia 11/08/2012  . Umbilical hernia 08/06/2012  . Smoker unmotivated to quit 07/22/2012  . Tremor 07/30/2011  . HTN (hypertension) 07/30/2011  . Hepatomegaly 11/05/2010  . Depression 11/05/2010  . Anxiety 11/05/2010   Past Medical History: Past Medical History  Diagnosis Date  . CHF (congestive heart failure) 10/2010    ECHO:  EF 40%, Grade II diastolic dysfunction  . Alcoholism   . Allergy     seasonal   . Anxiety   . Depression   . Asthma   . Hyperlipidemia   . Hypertension   . Seizures   . COPD (chronic obstructive pulmonary disease)    Past Surgical History: Past Surgical History  Procedure Laterality Date  . Removal of ingrown toenail     Social History: History  Substance Use Topics  . Smoking status: Current Every Day Smoker -- 1.25 packs/day for 40 years    Types: Cigarettes  . Smokeless tobacco: Never Used     Comment: a little more than 1 ppd (02/17/14)  . Alcohol Use: 0.0 oz/week    0 Standard drinks or equivalent per week     Comment: 4-5 burbon's a day for the past 15 years - 02/17/13 (enough alcohol to maintain not having seizures)   Please also refer to relevant sections of EMR.  Family History: Family History  Problem Relation Age of Onset  . Alzheimer's disease Mother   . Diabetes Mother   . Heart disease Maternal Uncle    Allergies and Medications: No Known Allergies No current facility-administered medications on file prior to encounter.   Current Outpatient Prescriptions on File Prior to Encounter  Medication Sig Dispense Refill  . acetaminophen (TYLENOL) 500 MG tablet Take 1,500 mg by mouth every 6 (six) hours as needed for  headache (headache).     Marland Kitchen aspirin EC 81 MG EC tablet Take 1 tablet (81 mg total) by mouth daily. 90 tablet 0  . atorvastatin (LIPITOR) 40 MG tablet Take 1 tablet (40 mg total) by mouth daily at 6 PM. 30 tablet 6  . B-Complex CAPS Take 1 capsule by mouth daily.    . carvedilol (COREG) 12.5 MG tablet Take 1 tablet (12.5 mg total) by mouth 2 (two) times daily with a meal. 60 tablet 6  . clonazePAM (KLONOPIN) 0.5 MG tablet Take 1 tablet (0.5 mg total) by mouth 2 (two) times daily as needed for anxiety. (Patient taking differently: Take 0.5 mg by mouth 2 (two) times daily. ) 60 tablet 1  . dicyclomine (BENTYL) 20 MG tablet Take 1 tablet (20 mg total) by mouth 3 (three) times daily before meals. 90 tablet 0  . doxycycline (VIBRA-TABS) 100 MG tablet Take 1 tablet (100 mg total) by mouth 2 (two) times daily. 20 tablet 0  . DULoxetine (CYMBALTA) 30 MG capsule Take 1 capsule (30 mg total) by mouth daily. 30 capsule 3  . fluticasone (FLONASE) 50 MCG/ACT nasal spray Use two sprays in each nostril daily 16 g 11  .  furosemide (LASIX) 40 MG tablet Take 1 tablet (40 mg total) by mouth daily. 30 tablet 6  . gabapentin (NEURONTIN) 300 MG capsule TAKE ONE CAPSULE BY MOUTH THREE TIMES DAILY  90 capsule 6  . Ipratropium-Albuterol (COMBIVENT RESPIMAT) 20-100 MCG/ACT AERS respimat INHALE TWO PUFFS BY MOUTH EVERY FOUR HOURS as needed for wheezing 4 g 3  . lisinopril (PRINIVIL,ZESTRIL) 20 MG tablet Take 0.5 tablets (10 mg total) by mouth daily. 30 tablet 10  . Magnesium 500 MG TABS Take 500 mg by mouth daily.    . montelukast (SINGULAIR) 10 MG tablet Take 1 tablet (10 mg total) by mouth at bedtime. 30 tablet 3  . Multiple Vitamin (MULTIVITAMIN WITH MINERALS) TABS tablet Take 1 tablet by mouth daily.    . potassium chloride SA (K-DUR,KLOR-CON) 20 MEQ tablet Take 2 tablets (40 mEq total) by mouth daily. 60 tablet 6  . predniSONE (DELTASONE) 50 MG tablet Take 1 tab daily x 5 days 5 tablet 0  . spironolactone (ALDACTONE) 25  MG tablet TAKE HALF TABLET BY MOUTH DAILY  15 tablet 5  . tiotropium (SPIRIVA) 18 MCG inhalation capsule Place 1 capsule (18 mcg total) into inhaler and inhale daily. 30 capsule 6  . triamcinolone (KENALOG) 0.025 % ointment Apply 1 application topically 2 (two) times daily. (Patient taking differently: Apply 1 application topically 2 (two) times daily as needed (for rash). ) 30 g 0  . Vitamin D, Ergocalciferol, (DRISDOL) 50000 UNITS CAPS capsule TAKE 1 CAPSULE BY MOUTH EVERY 7 DAYS. 6 capsule 0  . [DISCONTINUED] albuterol (PROVENTIL,VENTOLIN) 90 MCG/ACT inhaler Inhale 2 puffs into the lungs every 6 (six) hours as needed for wheezing. 17 g 0    Objective: BP 135/72 mmHg  Pulse 134  Temp(Src) 97.6 F (36.4 C) (Oral)  Resp 19  SpO2 94% Exam: General: 64 year old unkempt appearing man lying in hospital bed in NAD Eyes: EOMI ENTM: Poor dentition, MMM Neck: FROM Cardiovascular: Tachycardic, regular, no murmurs appreciated Respiratory: NWOB, diminished breath sounds bilaterally with no wheezes or crackles appreciated Abdomen: protuberant umbilical hernia noted. +BS, s, nt, nd, liver palpated 3cm below costal margin MSK: WWP Skin: No rashes Neuro: Alert and conversational. CN2-12 grossly intact. Strength equal bilaterally. Unsteady on feet. Negative Romberg. No pronator drift. No asterixis.  Psych: No apparent SI or HI or AVH.  Labs and Imaging: CBC BMET   Recent Labs Lab 06/23/14 1641  WBC 6.3  HGB 13.8  HCT 40.4  PLT 201    Recent Labs Lab 06/23/14 1641  NA 130*  K 4.4  CL 92*  CO2 21*  BUN 9  CREATININE 0.85  GLUCOSE 143*  CALCIUM 8.0*     Lactic acid 5.56 Troponin 0.01 Ethanol 333  EKG: Sinus tachycardia, RBBB, no acute ischemic changes  Dg Chest 2 View  06/23/2014   CLINICAL DATA:  Shortness of breath. Increase congestion. Chronic cough. History of COPD and heart failure.  EXAM: CHEST  2 VIEW  COMPARISON:  04/13/2014; 02/12/2014; chest CT - 04/13/2014  FINDINGS:  Grossly unchanged cardiac silhouette and mediastinal contours. Improved aeration of lungs with minimal residual left basilar linear heterogeneous opacities favored to represent atelectasis. No new focal airspace opacities. No pleural effusion or pneumothorax. No evidence of edema. Unchanged mild (approximately 25%) compression deformity of a lower thoracic vertebral body. Stigmata of DISH within the thoracic spine.  IMPRESSION: No acute cardiopulmonary disease.   Electronically Signed   By: Simonne Come M.D.   On: 06/23/2014 17:40  Ardith Dark, MD 06/23/2014, 6:50 PM PGY-1, [facility] FPTS Intern pager: [phone], text pages welcome   I have seen and evaluated the patient with Dr. Jimmey Ralph. I am in agreement with the note above in its revised form. My additions are in red.  Ryan B. Jarvis Newcomer, MD, PGY-2 06/23/2014 10:56 PM

## 2014-06-23 NOTE — ED Notes (Signed)
Pt expressing nausea, refusing zofran PRN at this time.

## 2014-06-23 NOTE — ED Notes (Signed)
Pt transported to xray at this time

## 2014-06-24 DIAGNOSIS — E785 Hyperlipidemia, unspecified: Secondary | ICD-10-CM

## 2014-06-24 DIAGNOSIS — R05 Cough: Secondary | ICD-10-CM

## 2014-06-24 DIAGNOSIS — J441 Chronic obstructive pulmonary disease with (acute) exacerbation: Secondary | ICD-10-CM

## 2014-06-24 DIAGNOSIS — J449 Chronic obstructive pulmonary disease, unspecified: Secondary | ICD-10-CM

## 2014-06-24 DIAGNOSIS — F10239 Alcohol dependence with withdrawal, unspecified: Secondary | ICD-10-CM

## 2014-06-24 DIAGNOSIS — I10 Essential (primary) hypertension: Secondary | ICD-10-CM | POA: Insufficient documentation

## 2014-06-24 LAB — URINALYSIS, ROUTINE W REFLEX MICROSCOPIC
BILIRUBIN URINE: NEGATIVE
Glucose, UA: NEGATIVE mg/dL
KETONES UR: NEGATIVE mg/dL
LEUKOCYTES UA: NEGATIVE
NITRITE: NEGATIVE
PROTEIN: 100 mg/dL — AB
Specific Gravity, Urine: 1.015 (ref 1.005–1.030)
UROBILINOGEN UA: 0.2 mg/dL (ref 0.0–1.0)
pH: 5 (ref 5.0–8.0)

## 2014-06-24 LAB — CBC
HCT: 32.2 % — ABNORMAL LOW (ref 39.0–52.0)
HEMOGLOBIN: 11.1 g/dL — AB (ref 13.0–17.0)
MCH: 31.8 pg (ref 26.0–34.0)
MCHC: 34.5 g/dL (ref 30.0–36.0)
MCV: 92.3 fL (ref 78.0–100.0)
Platelets: 166 10*3/uL (ref 150–400)
RBC: 3.49 MIL/uL — ABNORMAL LOW (ref 4.22–5.81)
RDW: 14.6 % (ref 11.5–15.5)
WBC: 10.6 10*3/uL — AB (ref 4.0–10.5)

## 2014-06-24 LAB — LACTIC ACID, PLASMA
LACTIC ACID, VENOUS: 2.9 mmol/L — AB (ref 0.5–2.0)
Lactic Acid, Venous: 3 mmol/L (ref 0.5–2.0)

## 2014-06-24 LAB — BASIC METABOLIC PANEL
ANION GAP: 10 (ref 5–15)
BUN: 6 mg/dL (ref 6–20)
CO2: 23 mmol/L (ref 22–32)
CREATININE: 0.83 mg/dL (ref 0.61–1.24)
Calcium: 6.8 mg/dL — ABNORMAL LOW (ref 8.9–10.3)
Chloride: 97 mmol/L — ABNORMAL LOW (ref 101–111)
GFR calc Af Amer: >60 mL/min (ref >60)
GFR calc non Af Amer: >60 mL/min (ref >60)
Glucose, Bld: 146 mg/dL — ABNORMAL HIGH (ref 65–99)
Potassium: 4.1 mmol/L (ref 3.5–5.1)
Sodium: 130 mmol/L — ABNORMAL LOW (ref 135–145)

## 2014-06-24 LAB — URINE MICROSCOPIC-ADD ON

## 2014-06-24 LAB — PROTIME-INR
INR: 1.19 (ref 0.00–1.49)
PROTHROMBIN TIME: 15.2 s (ref 11.6–15.2)

## 2014-06-24 LAB — TROPONIN I

## 2014-06-24 MED ORDER — CARVEDILOL 12.5 MG PO TABS
12.5000 mg | ORAL_TABLET | Freq: Two times a day (BID) | ORAL | Status: DC
  Filled 2014-06-24 (×3): qty 1

## 2014-06-24 MED ORDER — LORAZEPAM 2 MG/ML IJ SOLN: 1.0000 mg | Freq: Once | INTRAMUSCULAR | Status: DC

## 2014-06-24 MED ORDER — ATORVASTATIN CALCIUM 40 MG PO TABS
40.0000 mg | ORAL_TABLET | Freq: Every day | ORAL | Status: DC
  Administered 2014-06-24 – 2014-06-29 (×6): 40 mg via ORAL
  Filled 2014-06-24 (×7): qty 1

## 2014-06-24 MED ORDER — LISINOPRIL 5 MG PO TABS
5.0000 mg | ORAL_TABLET | Freq: Every day | ORAL | Status: DC
Start: 2014-06-24 — End: 2014-06-30
  Administered 2014-06-24 – 2014-06-30 (×7): 5 mg via ORAL
  Filled 2014-06-24 (×7): qty 1

## 2014-06-24 MED ORDER — IPRATROPIUM-ALBUTEROL 20-100 MCG/ACT IN AERS
2.0000 | INHALATION_SPRAY | RESPIRATORY_TRACT | Status: DC | PRN
  Administered 2014-06-25 – 2014-06-29 (×12): 2 via RESPIRATORY_TRACT
  Filled 2014-06-24: qty 4

## 2014-06-24 MED ORDER — SODIUM CHLORIDE 0.9 % IV BOLUS (SEPSIS)
1000.0000 mL | Freq: Once | INTRAVENOUS | Status: AC
  Administered 2014-06-24: 1000 mL via INTRAVENOUS

## 2014-06-24 MED ORDER — SODIUM CHLORIDE 0.9 % IV SOLN
INTRAVENOUS | Status: DC
  Administered 2014-06-24 – 2014-06-25 (×2): via INTRAVENOUS
  Administered 2014-06-25: 1000 mL via INTRAVENOUS
  Administered 2014-06-29: 06:00:00 via INTRAVENOUS

## 2014-06-24 MED ORDER — THIAMINE HCL 100 MG/ML IJ SOLN
100.0000 mg | Freq: Every day | INTRAMUSCULAR | Status: DC
  Filled 2014-06-24 (×2): qty 1

## 2014-06-24 MED ORDER — VITAMIN B-1 100 MG PO TABS
100.0000 mg | ORAL_TABLET | Freq: Every day | ORAL | Status: DC
  Administered 2014-06-25 – 2014-06-30 (×6): 100 mg via ORAL
  Filled 2014-06-24 (×7): qty 1

## 2014-06-24 MED ORDER — SODIUM CHLORIDE 0.9 % IV SOLN
8.0000 mg | Freq: Three times a day (TID) | INTRAVENOUS | Status: AC | PRN
  Filled 2014-06-24: qty 4

## 2014-06-24 MED ORDER — LISINOPRIL 10 MG PO TABS
10.0000 mg | ORAL_TABLET | Freq: Every day | ORAL | Status: DC
  Filled 2014-06-24: qty 1

## 2014-06-24 MED ORDER — TIOTROPIUM BROMIDE MONOHYDRATE 18 MCG IN CAPS
18.0000 ug | ORAL_CAPSULE | Freq: Every day | RESPIRATORY_TRACT | Status: DC
Start: 2014-06-24 — End: 2014-06-30
  Administered 2014-06-24 – 2014-06-30 (×7): 18 ug via RESPIRATORY_TRACT
  Filled 2014-06-24 (×2): qty 5

## 2014-06-24 MED ORDER — THIAMINE HCL 100 MG PO TABS: 100.0000 mg | ORAL_TABLET | Freq: Every day | ORAL | Status: DC

## 2014-06-24 MED ORDER — LORAZEPAM 2 MG/ML IJ SOLN: 1.0000 mg | Freq: Once | INTRAMUSCULAR | Status: DC | PRN

## 2014-06-24 MED ORDER — MONTELUKAST SODIUM 10 MG PO TABS
10.0000 mg | ORAL_TABLET | Freq: Every day | ORAL | Status: DC
  Administered 2014-06-24 – 2014-06-29 (×6): 10 mg via ORAL
  Filled 2014-06-24 (×7): qty 1

## 2014-06-24 MED ORDER — LORAZEPAM 2 MG/ML IJ SOLN
2.0000 mg | INTRAMUSCULAR | Status: DC | PRN
  Administered 2014-06-24 – 2014-06-25 (×2): 2 mg via INTRAVENOUS
  Filled 2014-06-24 (×2): qty 1

## 2014-06-24 MED ORDER — CARVEDILOL 25 MG PO TABS
25.0000 mg | ORAL_TABLET | Freq: Two times a day (BID) | ORAL | Status: DC
  Administered 2014-06-24 – 2014-06-30 (×13): 25 mg via ORAL
  Filled 2014-06-24 (×15): qty 1

## 2014-06-24 MED ORDER — ASPIRIN EC 81 MG PO TBEC
81.0000 mg | DELAYED_RELEASE_TABLET | Freq: Every day | ORAL | Status: DC
  Administered 2014-06-24 – 2014-06-30 (×7): 81 mg via ORAL
  Filled 2014-06-24 (×7): qty 1

## 2014-06-24 MED ORDER — IPRATROPIUM-ALBUTEROL 0.5-2.5 (3) MG/3ML IN SOLN
3.0000 mL | Freq: Four times a day (QID) | RESPIRATORY_TRACT | Status: DC | PRN
  Administered 2014-06-24 (×2): 3 mL via RESPIRATORY_TRACT
  Filled 2014-06-24 (×2): qty 3

## 2014-06-24 NOTE — Progress Notes (Signed)
Returned to reevaluate Juan [name] with his wife in the room. He reports feeling warm but no other changes. Ate some earlier. Wife says he literally drank a half gallon of bourbon over the past 24 hours. He has urinated once since arriving to the floor.   No documented fevers. RR normal; 100% on 2L by [place] HR somewhat improved at 120bpm, tacky mucous membranes, otherwise no change in exam.   Immediate concerns: he is still tachycardic, but did respond to IVF's so I have ordered another 1L NS bolus followed by continued NS at maintenance rate. It is possible his level of ethanol has continued to suppress ADH function allowing continued UOP despite intravascular depletion.   Ongoing concerns: I do not believe he is convinced of the connection between his various medical/social problems and alcohol. Thus, he is still at the rate limiting step of recovery from alcoholism: acceptance.

## 2014-06-24 NOTE — Progress Notes (Signed)
Patient wants combivent instead of duoneb. Notified on call for Family Medicine. Inquired to pharmacy if they had combivent inhalers available, they do.

## 2014-06-24 NOTE — Progress Notes (Signed)
LCSW consulted for alcohol abuse (Substance Abuse Consult) Patient well known to this Clinical research associate from previous work with patient in the Emergency Room. Patient again offered counseling and resources (inpatient vs outpatient) for long term use of alcohol. Patient refusing at this time. Denying needs.  Patient reports at this time things are stable and he is aware of AA meetings and services in the area. LCSW will sign off for now, however can be re-consulted it needs arise or if patient becomes interested.  List of resources given to patient.  [name], MSW Clinical Social Work: Emergency Room [phone]

## 2014-06-24 NOTE — Discharge Summary (Signed)
Family Medicine Teaching [facility] Discharge Summary  Patient name: Juan [name] Medical record number: [id number] Date of birth: September 19, 1950 Age: 64 y.o. Gender: male Date of Admission: 06/23/2014  Date of Discharge: 06/30/2014 Admitting Physician: Doreene Eland, MD  Primary Care Provider: Renold Don, MD Consultants: None  Indication for Hospitalization: Dehydration, alcohol intoxication  Discharge Diagnoses/Problem List:  Alcohol abuse, Dehydration, CHF, anxiety, depression, HLD, HTN, COPD  Disposition: Home  Discharge Condition: Stable  Discharge Exam:  Blood pressure 130/72, pulse 70, temperature 97.9 F (36.6 C), temperature source Oral, resp. rate 18, height 5\' 8"  (1.727 m), weight 185 lb 4.8 oz (84.052 kg), SpO2 94 %. General: 64 year old unkempt appearing man lying in hospital bed in NAD Cardiovascular: RRR, no murmurs appreciated Respiratory: NWOB, CTAB Abdomen: protuberant umbilical hernia noted. +BS, s, nt, nd,  Extremities: WWP Neuro: Alert and conversational. No focal neurological deficits noted.   Brief Hospital Course:  Juan [name] is a 64 year old male who presented with progressive weakness, falls, and dehydration in the setting of an alcohol binge. His hospital course by problem is outlined below:  Alcohol Abuse / Dehydration / Falls. On admission, patient was noted to be visibly intoxicated. He was tachycardic to the 120s and had a lactate of 5.56 on admission.  Patient did not meet any other SIRS criteria on admission and additionally had no sources of infection. Patient reported on admission that he had been drinking a "half gallon" of bourbon daily. We fluid resuscitated the patient and placed him on the CIWA protocol. His lactic acid gradually improved back to normal levels and his tachycardia gradually resolved. Of note, the patient's wife had been buying him alcohol out of fear that he may have a withdrawal seizure without it. Patient  developed significant withdrawal symptoms on hospital day 2 and was transferred to the step-down unit. He was given Ativan prn per CIWA and was able to be transferred back to the floor on hospital day 4. On the floor, he did well without significant complications and the only withdrawal symptoms he exhibited were mild anxiety. The patient's PCP had extensive conversations with the patient and his wife about options for alcohol cessation. Patient at first seemed to be in the pre-contemplative phase regarding his alcohol abuse and attributed his frequent hospitalizations to other causes. After these discussions, the patient and his wife decided to look into rehab options. He was discharged home in stable condition  COPD Exacerbation. Patient developed worsening wheezing with subjective shortness of breathing on hospital day 3. CXR was clear and we treated the patient with a 5 day course of prednisone and 7 day course of levaquin.   Tachycardia / HTN. We increased the patient's home dose of coreg to 25mg  bid and decreased his home dose of lisinopril to 5mg  daily.   The patient's other chronic medical conditions were stable during this admission.   Issues for Follow Up:  1. Patient stated that he would look into rehab options. Would continue to encourage alcohol cessation and rehab. 2. Follow up HR and BP. Increased the patient's coreg to 25mg  bid and decreased lisinopril to 5mg  daily 3. Follow up volume status. Patient was clinically dehydrated on admission, however was euvolemic at the time of discharge after fluid resuscitation. We restarted the patient's lasix and spironolactone at discharge - can consider reducing doses if patient continues to become dehydrated.  4. Follow up respiratory status. Patient finished steroid course here for COPD exacerbation and was discharged to  finish his 7 day course of levaquin.   Significant Procedures: None  Significant Labs and Imaging:   Recent Labs Lab  06/26/14 0320 06/27/14 0235 06/28/14 0330  WBC 6.8 8.0 9.3  HGB 9.7* 10.7* 11.1*  HCT 29.0* 31.1* 32.6*  PLT 142* 153 181    Recent Labs Lab 06/25/14 0224 06/26/14 0320 06/27/14 0235 06/28/14 0330 06/29/14 0418 06/30/14 0344  NA 133* 130* 128* 129* 131* 131*  K 3.5 2.9* 3.7 4.8 5.0 4.4  CL 102 98* 95* 96* 95* 92*  CO2 GLUCOSE 107* 106* 110* 117* 123* 143*  BUN 5* <5* <5* CREATININE 0.87 0.78 0.80 0.82 1.02 1.01  CALCIUM 7.1* 7.1* 7.5* 8.5* 9.1 8.9  ALKPHOS 100 98  --   --   --   --   AST 144* 118*  --   --   --   --   ALT 59 57  --   --   --   --   ALBUMIN 2.5* 2.6*  --   --   --   --    Lactate: 5.56 > 5.2 > 3.0 Troponin 0.01 Ethanol 333  BNP: 574.4  EKG: Sinus tachycardia, RBBB, no acute ischemic changes  Dg Chest 2 View  06/23/2014   CLINICAL DATA:  Shortness of breath. Increase congestion. Chronic cough. History of COPD and heart failure.  EXAM: CHEST  2 VIEW  COMPARISON:  04/13/2014; 02/12/2014; chest CT - 04/13/2014  FINDINGS: Grossly unchanged cardiac silhouette and mediastinal contours. Improved aeration of lungs with minimal residual left basilar linear heterogeneous opacities favored to represent atelectasis. No new focal airspace opacities. No pleural effusion or pneumothorax. No evidence of edema. Unchanged mild (approximately 25%) compression deformity of a lower thoracic vertebral body. Stigmata of DISH within the thoracic spine.  IMPRESSION: No acute cardiopulmonary disease.   Electronically Signed   By: Simonne Come M.D.   On: 06/23/2014 17:40   Results/Tests Pending at Time of Discharge: None  Discharge Medications:    Medication List    STOP taking these medications        doxycycline 100 MG tablet  Commonly known as:  VIBRA-TABS     predniSONE 50 MG tablet  Commonly known as:  DELTASONE      TAKE these medications        acetaminophen 500 MG tablet  Commonly known as:  TYLENOL  Take 1,500 mg by mouth every 6  (six) hours as needed for headache (headache).     aspirin 81 MG EC tablet  Take 1 tablet (81 mg total) by mouth daily.     atorvastatin 40 MG tablet  Commonly known as:  LIPITOR  Take 1 tablet (40 mg total) by mouth daily at 6 PM.     B-Complex Caps  Take 1 capsule by mouth daily.     carvedilol 25 MG tablet  Commonly known as:  COREG  Take 1 tablet (25 mg total) by mouth 2 (two) times daily with a meal.     clonazePAM 0.5 MG tablet  Commonly known as:  KLONOPIN  Take 1 tablet (0.5 mg total) by mouth 2 (two) times daily as needed for anxiety.     dicyclomine 20 MG tablet  Commonly known as:  BENTYL  Take 1 tablet (20 mg total) by mouth 3 (three) times daily before meals.     DULoxetine 30 MG capsule  Commonly known as:  CYMBALTA  Take  1 capsule (30 mg total) by mouth daily.     fluticasone 50 MCG/ACT nasal spray  Commonly known as:  FLONASE  Use two sprays in each nostril daily     furosemide 40 MG tablet  Commonly known as:  LASIX  Take 1 tablet (40 mg total) by mouth daily.     gabapentin 300 MG capsule  Commonly known as:  NEURONTIN  TAKE ONE CAPSULE BY MOUTH THREE TIMES DAILY     Ipratropium-Albuterol 20-100 MCG/ACT Aers respimat  Commonly known as:  COMBIVENT RESPIMAT  INHALE TWO PUFFS BY MOUTH EVERY FOUR HOURS as needed for wheezing     levofloxacin 500 MG tablet  Commonly known as:  LEVAQUIN  Take 1 tablet (500 mg total) by mouth daily.     lisinopril 5 MG tablet  Commonly known as:  PRINIVIL,ZESTRIL  Take 1 tablet (5 mg total) by mouth daily.     Magnesium 500 MG Tabs  Take 500 mg by mouth daily.     montelukast 10 MG tablet  Commonly known as:  SINGULAIR  Take 1 tablet (10 mg total) by mouth at bedtime.     multivitamin with minerals Tabs tablet  Take 1 tablet by mouth daily.     potassium chloride SA 20 MEQ tablet  Commonly known as:  K-DUR,KLOR-CON  Take 2 tablets (40 mEq total) by mouth daily.     sodium chloride 1 G tablet  Take 1 g by  mouth 2 (two) times daily with a meal.     spironolactone 25 MG tablet  Commonly known as:  ALDACTONE  TAKE HALF TABLET BY MOUTH DAILY     thiamine 100 MG tablet  Take 1 tablet (100 mg total) by mouth daily.     tiotropium 18 MCG inhalation capsule  Commonly known as:  SPIRIVA  Place 1 capsule (18 mcg total) into inhaler and inhale daily.     triamcinolone 0.025 % ointment  Commonly known as:  KENALOG  Apply 1 application topically 2 (two) times daily.     Vitamin D (Ergocalciferol) 50000 UNITS Caps capsule  Commonly known as:  DRISDOL  TAKE 1 CAPSULE BY MOUTH EVERY 7 DAYS.        Discharge Instructions: Please refer to Patient Instructions section of EMR for full details.  Patient was counseled important signs and symptoms that should prompt return to medical care, changes in medications, dietary instructions, activity restrictions, and follow up appointments.   Follow-Up Appointments: Follow-up Information    Follow up with [facility], [place] On 07/12/2014.   Specialty:  Family Medicine   Why:  10:45am   Contact information:   [address] [place] [address] [phone]       Ardith Dark, MD 07/01/2014, 10:00 AM PGY-1, [facility] Family Medicine

## 2014-06-24 NOTE — Progress Notes (Signed)
CRITICAL VALUE ALERT  Critical value received:  Lactic Acid = 5.2  Date of notification:  06/24/14  Time of notification:  0043   Critical value read back:Yes.    Nurse who received alert:  Burley Saver, RN  MD notified (1st page):  Family Medicine [phone]  Time of first page:  [phone]  Responding MD:  Dr. Nadine Counts  Time MD responded:  [phone]

## 2014-06-24 NOTE — Progress Notes (Signed)
Family Medicine Teaching Service Daily Progress Note Intern Pager: [phone]  Patient name: Juan [name] Medical record number: [id number] Date of birth: 07-10-1950 Age: 64 y.o. Gender: male  Primary Care Provider: Renold Don, MD Consultants: None Code Status: Full  Pt Overview and Major Events to Date:  5/19 - Admitted with Falls and dehydration secondary to alcohol abuse  Assessment and Plan: [name] is a 64 y.o. male presenting with increasing falls and dehydration in the setting of an alcohol binge. PMH is significant for alcohol abuse, history of alcohol withdrawal seizures, CHF, anxiety, depression, HLD, HTN, asthma  Falls / Tachycardia / Alcohol abuse. Tachycardic to 120s and lactate of 5.56 on admission, likely secondary to dehydration due to current alcohol binge. Patient with history of chronic tachycardia - has been evaluated by cardiology in the past. Does not meet SIRS criteria and has no signs of infection. Falls likely due to intoxication. Has strong history of alcohol abuse and was admitted 3 months ago for seizure secondary to withdrawals. Last drank on 5/19. Lactate improving with IVF.  - s/p 3.5L total bolus of NS - IVF: NS @125cc /hr - Trend lactic acid - CIWA - Patient initially reported interest in alcohol cessation, but now denying.  - Social work consulted - PT/OT consulted  COPD with history of aspiration pneumonia. Currently stable on room air. CXR negative. Does have increased sputum recently, but no signs of acute exacerbation. Not on home oxygen. Not treating as exacerbation at admission.  - Continue home spiriva, combivent, and singulair - Supplemental oxygen as needed to keep sats >88% - Low threshold to start antibiotics and systemic steroids if develops symptoms of acute exacerbation. (would need to cover anaerobes given history of aspiration, in addition to HCAP coverage if infiltrate found on CXR)  Hepatic steatosis: Noted on Abdominal  U/S. Now with elevated hepatic enzymes consistent with alcohol abuse (AST 210, ALT 88). LFTs normal last month.  - Continue to monitor  - Hep Panel (A, B, and C) negative March 2015 - Coags pending  History of HFpEF / HTN / HLD. Echo 04/2014 with EF 55-60%. Does not appear to be volume overloaded. Currently near dry weight (184-188lb). BNP 15.1.  - Hold home lasix and spironolactone in setting of dehydration - Increase home dose of coreg to 25mg  bid, and decrease home lisinopril to 5mg  daily.  - Continue ASA - Continue home statin - Troponin negative - f/u repeat EKG  Anxiety/depression - Continue home Cymbalta - Discontinuing home klonopin while on CIWA - would consider stopping as outpatient given history of alcoholism  Tobacco Abuse - Cessation counseling  FEN/GI: Heart healthy diet, NS@125c /hr Prophylaxis: lovenox  Disposition: Admitted to stepdown pending above management.   Subjective:  States that he became short of breath last night an coughed up sputum. Received breathing treatments and respiratory status much improved. Denies feeling anxious. States that he feels "30%" better.   Objective: Temp:  [97.6 F (36.4 C)-98.9 F (37.2 C)] 98.7 F (37.1 C) (05/20 0534) Pulse Rate:  [126-137] 126 (05/20 0534) Resp:  [15-22] 19 (05/20 0534) BP: (112-148)/(66-97) 148/97 mmHg (05/20 0534) SpO2:  [92 %-100 %] 99 % (05/20 0534) Weight:  [187 lb 9.8 oz (85.1 kg)] 187 lb 9.8 oz (85.1 kg) (05/19 2015) Physical Exam: General: 64 year old unkempt appearing man lying in hospital bed in NAD, visibly tremulous  Cardiovascular: Tachycardic, regular, no murmurs appreciated Respiratory: NWOB, CTAB Abdomen: protuberant umbilical hernia noted. +BS, s, nt, nd, liver palpated 3cm below  costal margin Extremities: WWP Neuro: Alert and conversational. CN2-12 grossly intact. Strength equal bilaterally. Visibly tremulous.   Laboratory/Imaging/Diagnostic Tests:  Recent Labs Lab 06/23/14 1641  06/24/14 0545  WBC 6.3 10.6*  HGB 13.8 11.1*  HCT 40.4 32.2*  PLT 201 166    Recent Labs Lab 06/23/14 1641 06/24/14 0545  NA 130* 130*  K 4.4 4.1  CL 92* 97*  CO2 21* 23  BUN 9 6  CREATININE 0.85 0.83  CALCIUM 8.0* 6.8*  PROT 6.6  --   BILITOT 0.8  --   ALKPHOS 150*  --   ALT 88*  --   AST 210*  --   GLUCOSE 143* 146*   Lactate: 5.56 > 5.2 > 3.0  Troponin <0.03  Ardith Dark, MD 06/24/2014, 8:33 AM PGY-1, [facility] Family Medicine FPTS Intern pager: [phone], text pages welcome

## 2014-06-24 NOTE — Discharge Instructions (Signed)
Alcohol Use Disorder °Alcohol use disorder is a mental disorder. It is not a one-time incident of heavy drinking. Alcohol use disorder is the excessive and uncontrollable use of alcohol over time that leads to problems with functioning in one or more areas of daily living. People with this disorder risk harming themselves and others when they drink to excess. Alcohol use disorder also can cause other mental disorders, such as mood and anxiety disorders, and serious physical problems. People with alcohol use disorder often misuse other drugs.  °Alcohol use disorder is common and widespread. Some people with this disorder drink alcohol to cope with or escape from negative life events. Others drink to relieve chronic pain or symptoms of mental illness. People with a family history of alcohol use disorder are at higher risk of losing control and using alcohol to excess.  °SYMPTOMS  °Signs and symptoms of alcohol use disorder may include the following:  °· Consumption of alcohol in larger amounts or over a longer period of time than intended. °· Multiple unsuccessful attempts to cut down or control alcohol use.   °· A great deal of time spent obtaining alcohol, using alcohol, or recovering from the effects of alcohol (hangover). °· A strong desire or urge to use alcohol (cravings).   °· Continued use of alcohol despite problems at work, school, or home because of alcohol use.   °· Continued use of alcohol despite problems in relationships because of alcohol use. °· Continued use of alcohol in situations when it is physically hazardous, such as driving a car. °· Continued use of alcohol despite awareness of a physical or psychological problem that is likely related to alcohol use. Physical problems related to alcohol use can involve the brain, heart, liver, stomach, and intestines. Psychological problems related to alcohol use include intoxication, depression, anxiety, psychosis, delirium, and dementia.   °· The need for  increased amounts of alcohol to achieve the same desired effect, or a decreased effect from the consumption of the same amount of alcohol (tolerance). °· Withdrawal symptoms upon reducing or stopping alcohol use, or alcohol use to reduce or avoid withdrawal symptoms. Withdrawal symptoms include: °· Racing heart. °· Hand tremor. °· Difficulty sleeping. °· Nausea. °· Vomiting. °· Hallucinations. °· Restlessness. °· Seizures. °DIAGNOSIS °Alcohol use disorder is diagnosed through an assessment by your health care provider. Your health care provider may start by asking three or four questions to screen for excessive or problematic alcohol use. To confirm a diagnosis of alcohol use disorder, at least two symptoms must be present within a 12-month period. The severity of alcohol use disorder depends on the number of symptoms: °· Mild--two or three. °· Moderate--four or five. °· Severe--six or more. °Your health care provider may perform a physical exam or use results from lab tests to see if you have physical problems resulting from alcohol use. Your health care provider may refer you to a mental health professional for evaluation. °TREATMENT  °Some people with alcohol use disorder are able to reduce their alcohol use to low-risk levels. Some people with alcohol use disorder need to quit drinking alcohol. When necessary, mental health professionals with specialized training in substance use treatment can help. Your health care provider can help you decide how severe your alcohol use disorder is and what type of treatment you need. The following forms of treatment are available:  °· Detoxification. Detoxification involves the use of prescription medicines to prevent alcohol withdrawal symptoms in the first week after quitting. This is important for people with a history of symptoms   of withdrawal and for heavy drinkers who are likely to have withdrawal symptoms. Alcohol withdrawal can be dangerous and, in severe cases, cause  death. Detoxification is usually provided in a hospital or in-patient substance use treatment facility.  Counseling or talk therapy. Talk therapy is provided by substance use treatment counselors. It addresses the reasons people use alcohol and ways to keep them from drinking again. The goals of talk therapy are to help people with alcohol use disorder find healthy activities and ways to cope with life stress, to identify and avoid triggers for alcohol use, and to handle cravings, which can cause relapse.  Medicines.Different medicines can help treat alcohol use disorder through the following actions:  Decrease alcohol cravings.  Decrease the positive reward response felt from alcohol use.  Produce an uncomfortable physical reaction when alcohol is used (aversion therapy).  Support groups. Support groups are run by people who have quit drinking. They provide emotional support, advice, and guidance. These forms of treatment are often combined. Some people with alcohol use disorder benefit from intensive combination treatment provided by specialized substance use treatment centers. Both inpatient and outpatient treatment programs are available. Document Released: 02/29/2004 Document Revised: 06/07/2013 Document Reviewed: 04/30/2012 [facility] Patient Information 2015 [place], [place]. This information is not intended to replace advice given to you by your health care provider. Make sure you discuss any questions you have with your health care provider.   Alcohol and Nutrition Nutrition serves two purposes. It provides energy. It also maintains body structure and function. Food supplies energy. It also provides the building blocks needed to replace worn or damaged cells. Alcoholics often eat poorly. This limits their supply of essential nutrients. This affects energy supply and structure maintenance. Alcohol also affects the body's nutrients in:  Digestion.  Storage.  Using and getting rid of  waste products. IMPAIRMENT OF NUTRIENT DIGESTION AND UTILIZATION   Once ingested, food must be broken down into small components (digested). Then it is available for energy. It helps maintain body structure and function. Digestion begins in the mouth. It continues in the stomach and intestines, with help from the pancreas. The nutrients from digested food are absorbed from the intestines into the blood. Then they are carried to the liver. The liver prepares nutrients for:  Immediate use.  Storage and future use.  Alcohol inhibits the breakdown of nutrients into usable molecules.  It decreases secretion of digestive enzymes from the pancreas.  Alcohol impairs nutrient absorption by damaging the cells lining the stomach and intestines.  It also interferes with moving some nutrients into the blood.  In addition, nutritional deficiencies themselves may lead to further absorption problems.  For example, folate deficiency changes the cells that line the small intestine. This impairs how water is absorbed. It also affects absorbed nutrients. These include glucose, sodium, and additional folate.  Even if nutrients are digested and absorbed, alcohol can prevent them from being fully used. It changes their transport, storage, and excretion. Impaired utilization of nutrients by alcoholics is indicated by:  Decreased liver stores of vitamins, such as vitamin A.  Increased excretion of nutrients such as fat. ALCOHOL AND ENERGY SUPPLY   Three basic nutritional components found in food are:  Carbohydrates.  Proteins.  Fats.  These are used as energy. Some alcoholics take in as much as 50% of their total daily calories from alcohol. They often neglect important foods.  Even when enough food is eaten, alcohol can impair the ways the body controls blood sugar (glucose) levels.  It may either increase or decrease blood sugar.  In non-diabetic alcoholics, increased blood sugar (hyperglycemia) is  caused by poor insulin secretion. It is usually temporary.  Decreased blood sugar (hypoglycemia) can cause serious injury even if this condition is short-lived. Low blood sugar can happen when a fasting or malnourished person drinks alcohol. When there is no food to supply energy, stored sugar is used up. The products of alcohol inhibit forming glucose from other compounds such as amino acids. As a result, alcohol causes the brain and other body tissue to lack glucose. It is needed for energy and function.  Alcohol is an energy source. But how the body processes and uses the energy from alcohol is complex. Also, when alcohol is substituted for carbohydrates, subjects tend to lose weight. This indicates that they get less energy from alcohol than from food. ALCOHOL - MAINTAINING CELL STRUCTURE AND FUNCTION  Structure Cells are made mostly of protein. So an adequate protein diet is important for maintaining cell structure. This is especially true if cells are being damaged. Research indicates that alcohol affects protein nutrition by causing impaired:  Digestion of proteins to amino acids.  Processing of amino acids by the small intestine and liver.  Synthesis of proteins from amino acids.  Protein secretion by the liver. Function Nutrients are essential for the body to function well. They provide the tools that the body needs to work well:   Proteins.  Vitamins.  Minerals. Alcohol can disrupt body function. It may cause nutrient deficiencies. And it may interfere with the way nutrients are processed. Vitamins  Vitamins are essential to maintain growth and normal metabolism. They regulate many of the body`s processes. Chronic heavy drinking causes deficiencies in many vitamins. This is caused by eating less. And, in some cases, vitamins may be poorly absorbed. For example, alcohol inhibits fat absorption. It impairs how the vitamins A, E, and D are normally absorbed along with dietary fats.  Not enough vitamin A may cause night blindness. Not enough vitamin D may cause softening of the bones.  Some alcoholics lack vitamins A, C, D, E, K, and the B vitamins. These are all involved in wound healing and cell maintenance. In particular, because vitamin K is necessary for blood clotting, lacking that vitamin can cause delayed clotting. The result is excess bleeding. Lacking other vitamins involved in brain function may cause severe neurological damage. Minerals Deficiencies of minerals such as calcium, magnesium, iron, and zinc are common in alcoholics. The alcohol itself does not seem to affect how these minerals are absorbed. Rather, they seem to occur secondary to other alcohol-related problems, such as:  Less calcium absorbed.  Not enough magnesium.  More urinary excretion.  Vomiting.  Diarrhea.  Not enough iron due to gastrointestinal bleeding.  Not enough zinc or losses related to other nutrient deficiencies.  Mineral deficiencies can cause a variety of medical consequences. These range from calcium-related bone disease to zinc-related night blindness and skin lesions. ALCOHOL, MALNUTRITION, AND MEDICAL COMPLICATIONS  Liver Disease   Alcoholic liver damage is caused primarily by alcohol itself. But poor nutrition may increase the risk of alcohol-related liver damage. For example, nutrients normally found in the liver are known to be affected by drinking alcohol. These include carotenoids, which are the major sources of vitamin A, and vitamin E compounds. Decreases in such nutrients may play some role in alcohol-related liver damage. Pancreatitis  Research suggests that malnutrition may increase the risk of developing alcoholic pancreatitis. Research suggests that a diet  lacking in protein may increase alcohol's damaging effect on the pancreas. Brain  Nutritional deficiencies may have severe effects on brain function. These may be permanent. Specifically, thiamine  deficiencies are often seen in alcoholics. They can cause severe neurological problems. These include:  Impaired movement.  Memory loss seen in Wernicke-Korsakoff syndrome. Pregnancy  Alcohol has toxic effects on fetal development. It causes alcohol-related birth defects. They include fetal alcohol syndrome. Alcohol itself is toxic to the fetus. Also, the nutritional deficiency can affect how the fetus develops. That may compound the risk of developmental damage.  Nutritional needs during pregnancy are 10% to 30% greater than normal. Food intake can increase by as much as 140% to cover the needs of both mother and fetus. An alcoholic mother`s nutritional problems may adversely affect the nutrition of the fetus. And alcohol itself can also restrict nutrition flow to the fetus. NUTRITIONAL STATUS OF ALCOHOLICS  Techniques for assessing nutritional status include:  Taking body measurements to estimate fat reserves. They include:  Weight.  Height.  Mass.  Skin fold thickness.  Performing blood analysis to provide measurements of circulating:  Proteins.  Vitamins.  Minerals.  These techniques tend to be imprecise. For many nutrients, there is no clear "cut-off" point that would allow an accurate definition of deficiency. So assessing the nutritional status of alcoholics is limited by these techniques. Dietary status may provide information about the risk of developing nutritional problems. Dietary status is assessed by:  Taking patients' dietary histories.  Evaluating the amount and types of food they are eating.  It is difficult to determine what exact amount of alcohol begins to have damaging effects on nutrition. In general, moderate drinkers have 2 drinks or less per day. They seem to be at little risk for nutritional problems. Various medical disorders begin to appear at greater levels.  Research indicates that the majority of even the heaviest drinkers have few obvious  nutritional deficiencies. Many alcoholics who are hospitalized for medical complications of their disease do have severe malnutrition. Alcoholics tend to eat poorly. Often they eat less than the amounts of food necessary to provide enough:  Carbohydrates.  Protein.  Fat.  Vitamins A and C.  B vitamins.  Minerals like calcium and iron. Of major concern is alcohol's effect on digesting food and use of nutrients. It may shift a mildly malnourished person toward severe malnutrition. Document Released: 11/15/2004 Document Revised: 04/15/2011 Document Reviewed: 05/01/2005 [facility] Patient Information 2015 [place], [place]. This information is not intended to replace advice given to you by your health care provider. Make sure you discuss any questions you have with your health care provider.   Alcohol Intoxication Alcohol intoxication occurs when the amount of alcohol that a person has consumed impairs his or her ability to mentally and physically function. Alcohol directly impairs the normal chemical activity of the brain. Drinking large amounts of alcohol can lead to changes in mental function and behavior, and it can cause many physical effects that can be harmful.  Alcohol intoxication can range in severity from mild to very severe. Various factors can affect the level of intoxication that occurs, such as the person's age, gender, weight, frequency of alcohol consumption, and the presence of other medical conditions (such as diabetes, seizures, or heart conditions). Dangerous levels of alcohol intoxication may occur when people drink large amounts of alcohol in a short period (binge drinking). Alcohol can also be especially dangerous when combined with certain prescription medicines or "recreational" drugs. SIGNS AND SYMPTOMS Some common  signs and symptoms of mild alcohol intoxication include:  Loss of coordination.  Changes in mood and behavior.  Impaired judgment.  Slurred speech. As  alcohol intoxication progresses to more severe levels, other signs and symptoms will appear. These may include:  Vomiting.  Confusion and impaired memory.  Slowed breathing.  Seizures.  Loss of consciousness. DIAGNOSIS  Your health care provider will take a medical history and perform a physical exam. You will be asked about the amount and type of alcohol you have consumed. Blood tests will be done to measure the concentration of alcohol in your blood. In many places, your blood alcohol level must be lower than 80 mg/dL (5.05%) to legally drive. However, many dangerous effects of alcohol can occur at much lower levels.  TREATMENT  People with alcohol intoxication often do not require treatment. Most of the effects of alcohol intoxication are temporary, and they go away as the alcohol naturally leaves the body. Your health care provider will monitor your condition until you are stable enough to go home. Fluids are sometimes given through an IV access tube to help prevent dehydration.  HOME CARE INSTRUCTIONS  Do not drive after drinking alcohol.  Stay hydrated. Drink enough water and fluids to keep your urine clear or pale yellow. Avoid caffeine.   Only take over-the-counter or prescription medicines as directed by your health care provider.  SEEK MEDICAL CARE IF:   You have persistent vomiting.   You do not feel better after a few days.  You have frequent alcohol intoxication. Your health care provider can help determine if you should see a substance use treatment counselor. SEEK IMMEDIATE MEDICAL CARE IF:   You become shaky or tremble when you try to stop drinking.   You shake uncontrollably (seizure).   You throw up (vomit) blood. This may be bright red or may look like black coffee grounds.   You have blood in your stool. This may be bright red or may appear as a black, tarry, bad smelling stool.   You become lightheaded or faint.  MAKE SURE YOU:   Understand these  instructions.  Will watch your condition.  Will get help right away if you are not doing well or get worse. Document Released: 10/31/2004 Document Revised: 09/23/2012 Document Reviewed: 06/26/2012 [facility] Patient Information 2015 [place], [place]. This information is not intended to replace advice given to you by your health care provider. Make sure you discuss any questions you have with your health care provider.   Alcohol Withdrawal Anytime drug use is interfering with normal living activities it has become abuse. This includes problems with family and friends. Psychological dependence has developed when your mind tells you that the drug is needed. This is usually followed by physical dependence when a continuing increase of drugs are required to get the same feeling or "high." This is known as addiction or chemical dependency. A person's risk is much higher if there is a history of chemical dependency in the family. Mild Withdrawal Following Stopping Alcohol, When Addiction or Chemical Dependency Has Developed When a person has developed tolerance to alcohol, any sudden stopping of alcohol can cause uncomfortable physical symptoms. Most of the time these are mild and consist of tremors in the hands and increases in heart rate, breathing, and temperature. Sometimes these symptoms are associated with anxiety, panic attacks, and bad dreams. There may also be stomach upset. Normal sleep patterns are often interrupted with periods of inability to sleep (insomnia). This may last for 6  months. Because of this discomfort, many people choose to continue drinking to get rid of this discomfort and to try to feel normal. Severe Withdrawal with Decreased or No Alcohol Intake, When Addiction or Chemical Dependency Has Developed About five percent of alcoholics will develop signs of severe withdrawal when they stop using alcohol. One sign of this is development of generalized seizures (convulsions). Other signs  of this are severe agitation and confusion. This may be associated with believing in things which are not real or seeing things which are not really there (delusions and hallucinations). Vitamin deficiencies are usually present if alcohol intake has been long-term. Treatment for this most often requires hospitalization and close observation. Addiction can only be helped by stopping use of all chemicals. This is hard but may save your life. With continual alcohol use, possible outcomes are usually loss of self respect and esteem, violence, and death. Addiction cannot be cured but it can be stopped. This often requires outside help and the care of professionals. Treatment centers are listed in the yellow pages under Cocaine, Narcotics, and Alcoholics Anonymous. Most hospitals and clinics can refer you to a specialized care center. It is not necessary for you to go through the uncomfortable symptoms of withdrawal. Your caregiver can provide you with medicines that will help you through this difficult period. Try to avoid situations, friends, or drugs that made it possible for you to keep using alcohol in the past. Learn how to say no. It takes a long period of time to overcome addictions to all drugs, including alcohol. There may be many times when you feel as though you want a drink. After getting rid of the physical addiction and withdrawal, you will have a lessening of the craving which tells you that you need alcohol to feel normal. Call your caregiver if more support is needed. Learn who to talk to in your family and among your friends so that during these periods you can receive outside help. Alcoholics Anonymous (AA) has helped many people over the years. To get further help, contact AA or call your caregiver, counselor, or clergyperson. Al-Anon and Alateen are support groups for friends and family members of an alcoholic. The people who love and care for an alcoholic often need help, too. For information  about these organizations, check your phone directory or call a local alcoholism treatment center.  SEEK IMMEDIATE MEDICAL CARE IF:   You have a seizure.  You have a fever.  You experience uncontrolled vomiting or you vomit up blood. This may be bright red or look like black coffee grounds.  You have blood in the stool. This may be bright red or appear as a black, tarry, bad-smelling stool.  You become lightheaded or faint. Do not drive if you feel this way. Have someone else drive you or call 161 for help.  You become more agitated or confused.  You develop uncontrolled anxiety.  You begin to see things that are not really there (hallucinate). Your caregiver has determined that you completely understand your medical condition, and that your mental state is back to normal. You understand that you have been treated for alcohol withdrawal, have agreed not to drink any alcohol for a minimum of 1 day, will not operate a car or other machinery for 24 hours, and have had an opportunity to ask any questions about your condition. Document Released: 10/31/2004 Document Revised: 04/15/2011 Document Reviewed: 09/09/2007 [facility] Patient Information 2015 [place], [place]. This information is not intended to replace  advice given to you by your health care provider. Make sure you discuss any questions you have with your health care provider.

## 2014-06-24 NOTE — Progress Notes (Signed)
**  Interval Note**  Paged by RN with concerns for wheezing, tremors.  Patient reports that he has been coughing and gagging on his mucous.  He has felt nauseated but has not vomited.  He reports that he feels worse than when he came in 2/2 wheeze and cough.  His wife reports that they were seen recently by Dr Gwendolyn Grant, who prescribed Prednisone and Doxy.  She reports that Mr Rusak has not been taking medications.  She also is asking about referral to pulmonology.  She said this was previously discussed w/PCP but Mr Obriant seemed to get better so was not referred.  Patient not diaphoretic and afebrile.  Patient resting in bed, wife at bedside, 2L [place] in place, wheezes appreciated throughout, RR normal, HR continues to be elevated 126.  Exam, otherwise, unchanged from previous.  Order home inhalers and Singulair.  Zofran increased from 4mg  to 8mg  PRN.  Will discuss with day team and reassess in am.  Would consider adding Prednisone and Doxy, if patient does not improve with home therapies.  Teghan Philbin M. Nadine Counts, DO PGY-1, [facility]

## 2014-06-25 LAB — CBC
HEMATOCRIT: 29.6 % — AB (ref 39.0–52.0)
Hemoglobin: 10 g/dL — ABNORMAL LOW (ref 13.0–17.0)
MCH: 32.2 pg (ref 26.0–34.0)
MCHC: 33.8 g/dL (ref 30.0–36.0)
MCV: 95.2 fL (ref 78.0–100.0)
Platelets: 153 10*3/uL (ref 150–400)
RBC: 3.11 MIL/uL — AB (ref 4.22–5.81)
RDW: 14.6 % (ref 11.5–15.5)
WBC: 5.7 10*3/uL (ref 4.0–10.5)

## 2014-06-25 LAB — COMPREHENSIVE METABOLIC PANEL
ALBUMIN: 2.5 g/dL — AB (ref 3.5–5.0)
ALT: 59 U/L (ref 17–63)
ANION GAP: 7 (ref 5–15)
AST: 144 U/L — AB (ref 15–41)
Alkaline Phosphatase: 100 U/L (ref 38–126)
BUN: 5 mg/dL — AB (ref 6–20)
CHLORIDE: 102 mmol/L (ref 101–111)
CO2: 24 mmol/L (ref 22–32)
CREATININE: 0.87 mg/dL (ref 0.61–1.24)
Calcium: 7.1 mg/dL — ABNORMAL LOW (ref 8.9–10.3)
GFR calc Af Amer: >60 mL/min (ref >60)
GFR calc non Af Amer: >60 mL/min (ref >60)
Glucose, Bld: 107 mg/dL — ABNORMAL HIGH (ref 65–99)
POTASSIUM: 3.5 mmol/L (ref 3.5–5.1)
Sodium: 133 mmol/L — ABNORMAL LOW (ref 135–145)
Total Bilirubin: 1.6 mg/dL — ABNORMAL HIGH (ref 0.3–1.2)
Total Protein: 5.2 g/dL — ABNORMAL LOW (ref 6.5–8.1)

## 2014-06-25 LAB — LACTIC ACID, PLASMA: Lactic Acid, Venous: 5.2 mmol/L (ref 0.5–2.0)

## 2014-06-25 MED ORDER — NICOTINE 21 MG/24HR TD PT24
21.0000 mg | MEDICATED_PATCH | Freq: Every day | TRANSDERMAL | Status: DC
  Administered 2014-06-25 – 2014-06-30 (×6): 21 mg via TRANSDERMAL
  Filled 2014-06-25 (×6): qty 1

## 2014-06-25 MED ORDER — LORAZEPAM 2 MG/ML IJ SOLN
2.0000 mg | Freq: Four times a day (QID) | INTRAMUSCULAR | Status: DC
  Administered 2014-06-25 – 2014-06-26 (×4): 2 mg via INTRAVENOUS
  Filled 2014-06-25 (×3): qty 1

## 2014-06-25 MED ORDER — LORAZEPAM 2 MG/ML IJ SOLN
2.0000 mg | INTRAMUSCULAR | Status: DC | PRN
  Administered 2014-06-25 – 2014-06-27 (×7): 2 mg via INTRAVENOUS
  Filled 2014-06-25 (×9): qty 1

## 2014-06-25 NOTE — Progress Notes (Signed)
Family Medicine Teaching Service Daily Progress Note Intern Pager: [phone]  Patient name: Juan [name] Medical record number: [id number] Date of birth: 03-02-1950 Age: 64 y.o. Gender: male  Primary Care Provider: Renold Don, MD Consultants: None Code Status: Full  Pt Overview and Major Events to Date:  5/19 - Admitted with Falls and dehydration secondary to alcohol abuse 5/21: ~ 48 hrs after last drink; CIWA 22 this morning. Schedule Ativan 2 mg q 6hrs. Monitor for DTs  Assessment and Plan: Juan [name] is a 64 y.o. male presenting with increasing falls and dehydration in the setting of an alcohol binge. PMH is significant for alcohol abuse, history of alcohol withdrawal seizures, CHF, anxiety, depression, HLD, HTN, asthma  Alcohol abuse. Tachycardic resolved.lactate of 5.56 on admission >> 2.9 (5/20)  likely secondary to dehydration due to current alcohol binge. Patient with history of chronic tachycardia - has been evaluated by cardiology in the past. Falls likely due to intoxication. Has strong history of alcohol abuse and was admitted 3 months ago for seizure secondary to withdrawals. Last drank on 5/19. s/p 3.5L total bolus of NS - IVF: NS /hr - CIWA: Last 3 - 6, 14, 22 - Scheduled Ativan  q 6hrs (5/21 >>) - Patient initially reported interest in alcohol cessation, but now denying = Social work consulted - PT/OT consulted  COPD with history of aspiration pneumonia. Currently stable on room air. CXR negative. Does have increased sputum recently, but no signs of acute exacerbation. Not on home oxygen. Not treating as exacerbation at admission.  - Continue home spiriva, combivent, and singulair - Low threshold to start antibiotics and systemic steroids if develops symptoms of acute exacerbation. (would need to cover anaerobes given history of aspiration, in addition to HCAP coverage if infiltrate found on CXR)  Hepatic steatosis: Noted on Abdominal U/S. Now with  elevated hepatic enzymes consistent with alcohol abuse (AST 210, ALT 88). LFTs normal last month.  - Continue to monitor  - Hep Panel (A, B, and C) negative March 2015 - Coags normal  History of HFpEF / HTN / HLD. Echo 04/2014 with EF 55-60%. Does not appear to be volume overloaded. Currently near dry weight (184-188lb). BNP 15.1.  - Hold home lasix and spironolactone in setting of dehydration - Increase home dose of coreg to  bid, and decrease home lisinopril to  daily.  - Continue ASA - Continue home statin - Troponin negative - f/u repeat EKG  Anxiety/depression - Continue home Cymbalta - Discontinuing home klonopin while on CIWA - would consider stopping as outpatient given history of alcoholism  Tobacco Abuse - Cessation counseling  FEN/GI: Heart healthy diet, NS@125c /hr Prophylaxis: lovenox  Disposition: Admitted to stepdown pending above management.   Subjective:  Denies any complaints today. No CP, Ab pain, trouble breathing.    Objective: Temp:  [98.2 F (36.8 C)-98.7 F (37.1 C)] 98.7 F (37.1 C) (05/21 0400) Pulse Rate:  [91-122] 91 (05/21 0400) Resp:  [19-32] 22 (05/21 0400) BP: (125-145)/(53-86) 128/53 mmHg (05/21 0400) SpO2:  [93 %-99 %] 93 % (05/21 0400) Physical Exam: General: 64 year old unkempt appearing man lying in hospital bed in NAD, Mild tremors;   Cardiovascular: RRR, no murmurs appreciated Respiratory: NWOB, CTAB Abdomen: protuberant umbilical hernia noted. +BS, s, nt, nd,  Extremities: WWP Neuro: Alert and conversational. mild tremulous. No asterixis   Laboratory/Imaging/Diagnostic Tests:  Recent Labs Lab 06/23/14 1641 06/24/14 0545 06/25/14 0224  WBC 6.3 10.6* 5.7  HGB 13.8 11.1* 10.0*  HCT 40.4 32.2*  29.6*  PLT 201 166 153    Recent Labs Lab 06/23/14 1641 06/24/14 0545 06/25/14 0224  NA 130* 130* 133*  K 4.4 4.1 3.5  CL 92* 97* 102  CO2 21* 23 24  BUN 9 6 5*  CREATININE 0.85 0.83 0.87  CALCIUM 8.0* 6.8* 7.1*   PROT 6.6  --  5.2*  BILITOT 0.8  --  1.6*  ALKPHOS 150*  --  100  ALT 88*  --  59  AST 210*  --  144*  GLUCOSE 143* 146* 107*   Lactate: 5.56 > 5.2 > 3.0  Troponin <0.03  Jamal Collin, MD 06/25/2014, 7:58 AM PGY-2, [facility] FPTS Intern pager: [phone], text pages welcome

## 2014-06-25 NOTE — Progress Notes (Signed)
SHIFT SUMMARY: Patient has been so restless and agitated this morning and has several attempts to get out of bed with unsteady gait.  Ativan was given as ordered.  Message sent to MD if I can get a restraint order.  MD stated to keep a close watch on the patient instead of applying the restraint. MD came to the room to assess the patient.    Wife arrived and patient is more calmer but still disoriented.  He is oriented to name, place and time.  He is occasionally hallucinating, thinking he is at home.  Wife is very supportive and cooperative with the staff.

## 2014-06-26 ENCOUNTER — Inpatient Hospital Stay (HOSPITAL_COMMUNITY): Payer: No Typology Code available for payment source

## 2014-06-26 DIAGNOSIS — J441 Chronic obstructive pulmonary disease with (acute) exacerbation: Secondary | ICD-10-CM | POA: Insufficient documentation

## 2014-06-26 LAB — COMPREHENSIVE METABOLIC PANEL
ALK PHOS: 98 U/L (ref 38–126)
ALT: 57 U/L (ref 17–63)
AST: 118 U/L — ABNORMAL HIGH (ref 15–41)
Albumin: 2.6 g/dL — ABNORMAL LOW (ref 3.5–5.0)
Anion gap: 10 (ref 5–15)
BUN: <5 mg/dL — ABNORMAL LOW (ref 6–20)
CALCIUM: 7.1 mg/dL — AB (ref 8.9–10.3)
CHLORIDE: 98 mmol/L — AB (ref 101–111)
CO2: 22 mmol/L (ref 22–32)
Creatinine, Ser: 0.78 mg/dL (ref 0.61–1.24)
GFR calc Af Amer: >60 mL/min (ref >60)
GFR calc non Af Amer: >60 mL/min (ref >60)
Glucose, Bld: 106 mg/dL — ABNORMAL HIGH (ref 65–99)
Potassium: 2.9 mmol/L — ABNORMAL LOW (ref 3.5–5.1)
SODIUM: 130 mmol/L — AB (ref 135–145)
Total Bilirubin: 1.5 mg/dL — ABNORMAL HIGH (ref 0.3–1.2)
Total Protein: 5.5 g/dL — ABNORMAL LOW (ref 6.5–8.1)

## 2014-06-26 LAB — BRAIN NATRIURETIC PEPTIDE: B Natriuretic Peptide: 574.4 pg/mL — ABNORMAL HIGH (ref 0.0–100.0)

## 2014-06-26 LAB — CBC
HEMATOCRIT: 29 % — AB (ref 39.0–52.0)
Hemoglobin: 9.7 g/dL — ABNORMAL LOW (ref 13.0–17.0)
MCH: 31.8 pg (ref 26.0–34.0)
MCHC: 33.4 g/dL (ref 30.0–36.0)
MCV: 95.1 fL (ref 78.0–100.0)
Platelets: 142 10*3/uL — ABNORMAL LOW (ref 150–400)
RBC: 3.05 MIL/uL — AB (ref 4.22–5.81)
RDW: 14.4 % (ref 11.5–15.5)
WBC: 6.8 10*3/uL (ref 4.0–10.5)

## 2014-06-26 LAB — LACTIC ACID, PLASMA: Lactic Acid, Venous: 0.8 mmol/L (ref 0.5–2.0)

## 2014-06-26 MED ORDER — ENSURE PUDDING PO PUDG
1.0000 | Freq: Three times a day (TID) | ORAL | Status: DC
  Administered 2014-06-26 – 2014-06-29 (×8): 1 via ORAL
  Filled 2014-06-26 (×18): qty 1

## 2014-06-26 MED ORDER — PREDNISONE 50 MG PO TABS
50.0000 mg | ORAL_TABLET | Freq: Every day | ORAL | Status: DC
  Administered 2014-06-27 – 2014-06-30 (×4): 50 mg via ORAL
  Filled 2014-06-26 (×5): qty 1

## 2014-06-26 MED ORDER — LEVOFLOXACIN 500 MG PO TABS
500.0000 mg | ORAL_TABLET | Freq: Every day | ORAL | Status: DC
  Administered 2014-06-26 – 2014-06-30 (×5): 500 mg via ORAL
  Filled 2014-06-26 (×5): qty 1

## 2014-06-26 MED ORDER — LORAZEPAM 2 MG/ML IJ SOLN: 1.0000 mg | Freq: Four times a day (QID) | INTRAMUSCULAR | Status: DC

## 2014-06-26 MED ORDER — POTASSIUM CHLORIDE CRYS ER 20 MEQ PO TBCR
40.0000 meq | EXTENDED_RELEASE_TABLET | Freq: Three times a day (TID) | ORAL | Status: DC
  Administered 2014-06-26 – 2014-06-29 (×10): 40 meq via ORAL
  Filled 2014-06-26 (×13): qty 2

## 2014-06-26 NOTE — Progress Notes (Signed)
Family Medicine Teaching Service Daily Progress Note Intern Pager: [phone]  Patient name: Juan [name] Medical record number: [id number] Date of birth: 06-28-1950 Age: 64 y.o. Gender: male  Primary Care Provider: Renold Don, MD Consultants: None Code Status: Full  Pt Overview and Major Events to Date:  5/19 - Admitted with Falls and dehydration secondary to alcohol abuse 5/21: ~ 48 hrs after last drink; CIWA 22 this morning. Schedule Ativan 2 mg q 6hrs. Monitor for DTs 5/22: Inc sputum production, cough and SOB still on RA. CXR ordered. Start Abx and steroids for COPD  Assessment and Plan: Juan [name] is a 64 y.o. male presenting with increasing falls and dehydration in the setting of an alcohol binge. PMH is significant for alcohol abuse, history of alcohol withdrawal seizures, CHF, anxiety, depression, HLD, HTN, asthma  Alcohol abuse. Tachycardic resolved.lactate of 5.56 on admission >> 2.9 (5/20)  likely secondary to dehydration due to current alcohol binge. Patient with history of chronic tachycardia - has been evaluated by cardiology in the past. Falls likely due to intoxication. Has strong history of alcohol abuse and was admitted 3 months ago for seizure secondary to withdrawals. Last drank on 5/19. s/p 3.5L total bolus of NS - CIWA: Last 3 - 8,7,8 - Scheduled Ativan stopped - Patient initially reported interest in alcohol cessation, but now denying = Social work consulted - PT/OT consulted  COPD with history of aspiration pneumonia. Currently stable on room air. increased sputum, cough and SOB 5/22.  - Obtain CXR (would need to cover anaerobes given history of aspiration, in addition to HCAP coverage if infiltrate found on CXR) - Continue home spiriva, combivent, and singulair -  Prednisone  qd (5/22 >>) - Levaquin (5/22>>)  Hepatic steatosis: Noted on Abdominal U/S. Now with elevated hepatic enzymes consistent with alcohol abuse (AST 210, ALT 88). LFTs normal  last month.  - Continue to monitor  - Hep Panel (A, B, and C) negative March 2015 - Coags normal  History of HFpEF / HTN / HLD. Echo 04/2014 with EF 55-60%. Does not appear to be volume overloaded. Currently near dry weight (184-188lb). BNP 15.1.  - Holding home lasix and spironolactone in setting of dehydration - Increased home dose of coreg to  bid, and decrease home lisinopril to  daily.  - Continue ASA - Continue home statin - Troponin negative  Anxiety/depression - Continue home Cymbalta - Discontinuing home klonopin while on CIWA - would consider stopping as outpatient given history of alcoholism  Tobacco Abuse - Cessation counseling  FEN/GI: Heart healthy diet,KVO Prophylaxis: lovenox  Disposition: Admitted to stepdown pending above management.   Subjective:  Reports worsening cough w/ sputum production and increased SOB. Denies fevers, chills, CP.   Objective: Temp:  [98.2 F (36.8 C)-98.6 F (37 C)] 98.2 F (36.8 C) (05/22 0730) Pulse Rate:  [79-95] 79 (05/22 1000) Resp:  [17-33] 19 (05/22 1000) BP: (109-156)/(70-90) 123/80 mmHg (05/22 1000) SpO2:  [80 %-97 %] 94 % (05/22 1000) FiO2 (%):  [21 %] 21 % (05/22 0930) Physical Exam: General: 64 year old unkempt appearing man lying in hospital bed Cardiovascular: RRR, no murmurs appreciated Respiratory: mild tachypnea, Rhonchi b/l  Abdomen: protuberant umbilical hernia noted. +BS, s, nt, nd,  Extremities: WWP Neuro: Alert and conversational. mild tremulous. No asterixis   Laboratory/Imaging/Diagnostic Tests:  Recent Labs Lab 06/24/14 0545 06/25/14 0224 06/26/14 0320  WBC 10.6* 5.7 6.8  HGB 11.1* 10.0* 9.7*  HCT 32.2* 29.6* 29.0*  PLT 166 153 142*  Recent Labs Lab 06/23/14 1641 06/24/14 0545 06/25/14 0224 06/26/14 0320  NA 130* 130* 133* 130*  K 4.4 4.1 3.5 2.9*  CL 92* 97* 102 98*  CO2 21* 23 24 22   BUN 9 6 5* <5*  CREATININE 0.85 0.83 0.87 0.78  CALCIUM 8.0* 6.8* 7.1* 7.1*  PROT 6.6   --  5.2* 5.5*  BILITOT 0.8  --  1.6* 1.5*  ALKPHOS 150*  --  100 98  ALT 88*  --  59 57  AST 210*  --  144* 118*  GLUCOSE 143* 146* 107* 106*   Lactate: 5.56 > 5.2 > 3.0  Troponin <0.03  Jamal Collin, MD 06/26/2014, 11:03 AM PGY-2, [facility] FPTS Intern pager: [phone], text pages welcome

## 2014-06-26 NOTE — Evaluation (Signed)
Physical Therapy Evaluation Patient Details Name: Juan [name] MRN: [id number] DOB: 1950/08/25 Today's Date: 06/26/2014   History of Present Illness  Juan [name] is a 64 y.o. male presenting with increasing falls and dehydration in the setting of an alcohol binge. PMH is significant for alcohol abuse, history of alcohol withdrawal seizures, CHF, anxiety, depression, HLD, HTN, asthma  Clinical Impression  Pt admitted with above diagnosis. Pt currently with functional limitations due to the deficits listed below (see PT Problem List).  Pt will benefit from skilled PT to increase their independence and safety with mobility to allow discharge to the venue listed below.       Follow Up Recommendations Home health PT;Other (comment) (Will need to monitor for progress with functional mobility, if slow progress, must consider SNF)    Equipment Recommendations  None recommended by PT    Recommendations for Other Services OT consult     Precautions / Restrictions Precautions Precautions: Fall      Mobility  Bed Mobility Overal bed mobility: Needs Assistance Bed Mobility: Supine to Sit     Supine to sit: Mod assist     General bed mobility comments: Mod assist to pull to sit once feet and knees cleared from EOB; extremely slow-moving, and requiring step-by-step cues  Transfers Overall transfer level: Needs assistance Equipment used: Rolling walker (2 wheeled) Transfers: Sit to/from Stand Sit to Stand: Mod assist         General transfer comment: light mod assist to power up from bed at lowest setting; noted uncontrolled descent to sit  Ambulation/Gait Ambulation/Gait assistance: Min assist Ambulation Distance (Feet):  (pivotal steps bed to chair) Assistive device: Rolling walker (2 wheeled) Gait Pattern/deviations: Shuffle;Trunk flexed     General Gait Details: small steps to get to chair, trunk flexed; pt opted to sit instead of walking more  Stairs            Wheelchair Mobility    Modified Rankin (Stroke Patients Only)       Balance Overall balance assessment: Needs assistance           Standing balance-Leahy Scale: Poor                               Pertinent Vitals/Pain Pain Assessment: No/denies pain    Home Living Family/patient expects to be discharged to:: Private residence Living Arrangements: Spouse/significant other (It is also documented that pt lives alone) Available Help at Discharge: Other (Comment);Family;Available PRN/intermittently (Will need reliable info re: available home assist) Type of Home: House Home Access: Level entry (front is level; another entry has 8 steps per pt)     Home Layout: Two level;Able to live on main level with bedroom/bathroom;Laundry or work area in [employer] Equipment: Environmental consultant - 2 wheels;Shower seat;Grab bars - tub/shower (walking stick)      Prior Function Level of Independence: Independent         Comments: per note from 2 months ago, pt drives, but rarely     Hand Dominance   Dominant Hand: Right    Extremity/Trunk Assessment   Upper Extremity Assessment: Generalized weakness           Lower Extremity Assessment: Generalized weakness         Communication   Communication: No difficulties (but slow to answer questions on eval)  Cognition Arousal/Alertness: Lethargic (slow movign, but eyes open most of session) Behavior During Therapy: [facility] for tasks assessed/performed  Overall Cognitive Status: No family/caregiver present to determine baseline cognitive functioning                      General Comments General comments (skin integrity, edema, etc.): Session conducted on Room Air and O2 sats remained greater than or equal to 94%    Exercises        Assessment/Plan    PT Assessment Patient needs continued PT services  PT Diagnosis Difficulty walking;Generalized weakness   PT Problem List Decreased strength;Decreased  activity tolerance;Decreased balance;Decreased mobility;Decreased coordination;Decreased cognition;Decreased knowledge of use of DME;Decreased safety awareness;Decreased knowledge of precautions  PT Treatment Interventions DME instruction;Gait training;Stair training;Functional mobility training;Therapeutic activities;Therapeutic exercise;Balance training;Cognitive remediation;Patient/family education   PT Goals (Current goals can be found in the Care Plan section) Acute Rehab PT Goals Patient Stated Goal: did not state PT Goal Formulation: With patient Time For Goal Achievement: 07/10/14 Potential to Achieve Goals: Good    Frequency Min 3X/week   Barriers to discharge Decreased caregiver support Not sure exactly how much assist is available to pt; Not sure if his wife is still with him    Co-evaluation               End of Session Equipment Utilized During Treatment: Gait belt Activity Tolerance: Patient tolerated treatment well Patient left: in chair;with call bell/phone within reach;with chair alarm set (eating lunch) Nurse Communication: Mobility status         Time: 6962-9528 PT Time Calculation (min) (ACUTE ONLY): 22 min   Charges:         PT G CodesOlen Pel 06/26/2014, 2:49 PM  Van Clines, Jal  Acute Rehabilitation Services Pager [phone] Office [phone]

## 2014-06-27 DIAGNOSIS — F10939 Alcohol use, unspecified with withdrawal, unspecified: Secondary | ICD-10-CM | POA: Insufficient documentation

## 2014-06-27 DIAGNOSIS — F10239 Alcohol dependence with withdrawal, unspecified: Secondary | ICD-10-CM | POA: Insufficient documentation

## 2014-06-27 LAB — CBC WITH DIFFERENTIAL/PLATELET
BASOS ABS: 0 10*3/uL (ref 0.0–0.1)
Basophils Relative: 1 % (ref 0–1)
EOS ABS: 0.4 10*3/uL (ref 0.0–0.7)
Eosinophils Relative: 5 % (ref 0–5)
HEMATOCRIT: 31.1 % — AB (ref 39.0–52.0)
Hemoglobin: 10.7 g/dL — ABNORMAL LOW (ref 13.0–17.0)
Lymphocytes Relative: 14 % (ref 12–46)
Lymphs Abs: 1.1 10*3/uL (ref 0.7–4.0)
MCH: 32.4 pg (ref 26.0–34.0)
MCHC: 34.4 g/dL (ref 30.0–36.0)
MCV: 94.2 fL (ref 78.0–100.0)
MONOS PCT: 16 % — AB (ref 3–12)
Monocytes Absolute: 1.2 10*3/uL — ABNORMAL HIGH (ref 0.1–1.0)
NEUTROS ABS: 5.2 10*3/uL (ref 1.7–7.7)
NEUTROS PCT: 64 % (ref 43–77)
PLATELETS: 153 10*3/uL (ref 150–400)
RBC: 3.3 MIL/uL — ABNORMAL LOW (ref 4.22–5.81)
RDW: 14.3 % (ref 11.5–15.5)
WBC: 8 10*3/uL (ref 4.0–10.5)

## 2014-06-27 LAB — BASIC METABOLIC PANEL
Anion gap: 9 (ref 5–15)
CALCIUM: 7.5 mg/dL — AB (ref 8.9–10.3)
CO2: 24 mmol/L (ref 22–32)
CREATININE: 0.8 mg/dL (ref 0.61–1.24)
Chloride: 95 mmol/L — ABNORMAL LOW (ref 101–111)
GLUCOSE: 110 mg/dL — AB (ref 65–99)
Potassium: 3.7 mmol/L (ref 3.5–5.1)
Sodium: 128 mmol/L — ABNORMAL LOW (ref 135–145)

## 2014-06-27 MED ORDER — SPIRONOLACTONE 12.5 MG HALF TABLET
12.5000 mg | ORAL_TABLET | Freq: Every day | ORAL | Status: DC
  Administered 2014-06-27 – 2014-06-28 (×2): 12.5 mg via ORAL
  Filled 2014-06-27 (×2): qty 1

## 2014-06-27 MED ORDER — FUROSEMIDE 40 MG PO TABS
40.0000 mg | ORAL_TABLET | Freq: Every day | ORAL | Status: DC
  Administered 2014-06-27 – 2014-06-28 (×2): 40 mg via ORAL
  Filled 2014-06-27 (×2): qty 1

## 2014-06-27 MED ORDER — LORAZEPAM 2 MG/ML IJ SOLN
1.0000 mg | Freq: Four times a day (QID) | INTRAMUSCULAR | Status: DC | PRN
Start: 2014-06-27 — End: 2014-06-30

## 2014-06-27 MED ORDER — ACETAMINOPHEN 500 MG PO TABS
500.0000 mg | ORAL_TABLET | Freq: Four times a day (QID) | ORAL | Status: DC | PRN
  Administered 2014-06-27: 500 mg via ORAL
  Filled 2014-06-27: qty 1

## 2014-06-27 MED ORDER — LORAZEPAM 1 MG PO TABS
1.0000 mg | ORAL_TABLET | Freq: Four times a day (QID) | ORAL | Status: DC | PRN
  Administered 2014-06-28 – 2014-06-30 (×7): 1 mg via ORAL
  Filled 2014-06-27 (×7): qty 1

## 2014-06-27 NOTE — Progress Notes (Signed)
Family Medicine Teaching Service Daily Progress Note Intern Pager: [phone]  Patient name: Juan [name] Medical record number: [id number] Date of birth: 11/20/1950 Age: 64 y.o. Gender: male  Primary Care Provider: Renold Don, MD Consultants: None Code Status: Full  Pt Overview and Major Events to Date:  5/19 - Admitted with Falls and dehydration secondary to alcohol abuse 5/21: ~ 48 hrs after last drink; CIWA 22 this morning. Schedule Ativan 2 mg q 6hrs. Monitor for DTs  Assessment and Plan: KIP CROPP is a 64 y.o. male presenting with increasing falls and dehydration in the setting of an alcohol binge. PMH is significant for alcohol abuse, history of alcohol withdrawal seizures, CHF, anxiety, depression, HLD, HTN, asthma  Alcohol abuse. Patient noted to be tachycardic with lactic acidosis on admission, likely secondary to dehydration due to current alcohol binge (now resolved). Has strong history of alcohol abuse and was admitted 3 months ago for seizure secondary to withdrawals. Last drank on 5/19.  - CIWA - s/p Scheduled Ativan  q 6hrs (5/21 >> 5/22) - Patient initially reported interest in alcohol cessation, but now denying = Social work consulted - PT/OT consulted  COPD with history of aspiration pneumonia. Did not have signs of acute exacerbation on admission and was stable on room air with negative CXR. Now with worsened wheezing and SOB. Repeat CXR negative. Mild fever on 5/23.  - Continue home spiriva, combivent, and singulair - Levofloxacin (5/22- ) - Prednisone (5/22- )  History of HFpEF / HTN / HLD. Echo 04/2014 with EF 55-60%. Did not appear to be volume overloaded on admission and wasy near dry weight (184-188lb). Now up 5 pounds, and with elevated BNP (574) - Increase home dose of coreg to  bid, and decrease home lisinopril to  daily.  - Restart home spironolactone and lasix.  - Continue ASA, statin  Hepatic steatosis: Noted on Abdominal U/S. Now  with elevated hepatic enzymes consistent with alcohol abuse (AST 210, ALT 88). LFTs normal last month.  - Continue to monitor  - Hep Panel (A, B, and C) negative March 2015 - Coags normal  Anxiety/depression - Continue home Cymbalta - Discontinuing home klonopin while on CIWA - would consider stopping as outpatient given history of alcoholism  Tobacco Abuse - Cessation counseling  FEN/GI: Heart healthy diet, SLIV Prophylaxis: lovenox  Disposition: Admitted to stepdown pending above management.   Subjective:  No complaints. Breathing better. No CP. No fevers or chills. Still unsure of going to rehab.   Objective: Temp:  [98.2 F (36.8 C)-100.8 F (38.2 C)] 100.8 F (38.2 C) (05/23 0435) Pulse Rate:  [79-93] 92 (05/23 0435) Resp:  [19-29] 28 (05/23 0435) BP: (123-157)/(77-96) 157/96 mmHg (05/23 0435) SpO2:  [91 %-96 %] 92 % (05/23 0435) FiO2 (%):  [21 %] 21 % (05/22 0930) Weight:  [192 lb 10.9 oz (87.4 kg)] 192 lb 10.9 oz (87.4 kg) (05/23 0435) Physical Exam: General: 64 year old unkempt appearing man lying in hospital bed in NAD Cardiovascular: RRR, no murmurs appreciated Respiratory: NWOB, CTAB Abdomen: protuberant umbilical hernia noted. +BS, s, nt, nd,  Extremities: WWP Neuro: Alert and conversational. mild tremulous. No asterixis. No focal neurological deficits noted.   Laboratory/Imaging/Diagnostic Tests:  Recent Labs Lab 06/25/14 0224 06/26/14 0320 06/27/14 0235  WBC 5.7 6.8 8.0  HGB 10.0* 9.7* 10.7*  HCT 29.6* 29.0* 31.1*  PLT 153 142* 153    Recent Labs Lab 06/23/14 1641  06/25/14 0224 06/26/14 0320 06/27/14 0235  NA 130*  < >  133* 130* 128*  K 4.4  < > 3.5 2.9* 3.7  CL 92*  < > 102 98* 95*  CO2 21*  < > 24 22 24   BUN 9  < > 5* <5* <5*  CREATININE 0.85  < > 0.87 0.78 0.80  CALCIUM 8.0*  < > 7.1* 7.1* 7.5*  PROT 6.6  --  5.2* 5.5*  --   BILITOT 0.8  --  1.6* 1.5*  --   ALKPHOS 150*  --  100 98  --   ALT 88*  --  59 57  --   AST 210*  --   144* 118*  --   GLUCOSE 143*  < > 107* 106* 110*  < > = values in this interval not displayed.  BNP: 574.4  Ardith Dark, MD 06/27/2014, 6:46 AM PGY-1, [facility] FPTS Intern pager: [phone], text pages welcome

## 2014-06-27 NOTE — Progress Notes (Signed)
Attempted to call report but RN in a contact room and the secretary said she would call back. Pt now eating dinner. Will attempt to call report again and allow pt time to eat dinner before transferring. Lorin Picket, RN

## 2014-06-27 NOTE — Evaluation (Signed)
Occupational Therapy Evaluation Patient Details Name: Juan [name] MRN: [id number] DOB: Jul 29, 1950 Today's Date: 06/27/2014    History of Present Illness Juan [name] is a 64 y.o. male presenting with increasing falls and dehydration in the setting of an alcohol binge. PMH is significant for alcohol abuse, history of alcohol withdrawal seizures, CHF, anxiety, depression, HLD, HTN, asthma   Clinical Impression   Pt admitted with dehydration. Pt currently with functional limitations due to the deficits listed below (see OT Problem List).  Pt will benefit from skilled OT to increase their safety and independence with ADL and functional mobility for ADL to facilitate discharge to venue listed below.      Follow Up Recommendations  Home health OT;SNF;Supervision/Assistance - 24 hour;Other (comment) (depending on progress and care at home)    Equipment Recommendations  3 in 1 bedside comode    Recommendations for Other Services       Precautions / Restrictions Precautions Precautions: Fall      Mobility Bed Mobility Overal bed mobility: Needs Assistance Bed Mobility: Supine to Sit     Supine to sit: Mod assist     General bed mobility comments: Mod assist to pull to sit once feet and knees cleared from EOB; extremely slow-moving, and requiring step-by-step cues  Transfers Overall transfer level: Needs assistance Equipment used: 1 person hand held assist Transfers: Sit to/from [employer] Transfers Sit to Stand: Mod assist Stand pivot transfers: Mod assist            Balance Overall balance assessment: Needs assistance   Sitting balance-Leahy Scale: Good     Standing balance support: Single extremity supported Standing balance-Leahy Scale: Fair                              ADL Overall ADL's : Needs assistance/impaired Eating/Feeding: Minimal assistance;Sitting   Grooming: Min guard;Sitting   Upper Body Bathing: Minimal  assitance;Sitting   Lower Body Bathing: Sit to/from stand;Maximal assistance   Upper Body Dressing : Set up;Sitting   Lower Body Dressing: Sit to/from stand;Maximal assistance       Toileting- Clothing Manipulation and Hygiene: Minimal assistance;Sit to/from stand Toileting - Clothing Manipulation Details (indicate cue type and reason): using urinal                       Pertinent Vitals/Pain Pain Assessment: No/denies pain     Hand Dominance Right   Extremity/Trunk Assessment Upper Extremity Assessment Upper Extremity Assessment: Generalized weakness           Communication Communication Communication: No difficulties (but slow to answer questions on eval)   Cognition Arousal/Alertness: Lethargic;Awake/alert Behavior During Therapy: Flat affect Overall Cognitive Status: Within Functional Limits for tasks assessed                     General Comments   pt agreed to OOB  Pt had been sleeping dall day prior to OT eval            Home Living Family/patient expects to be discharged to:: Private residence Living Arrangements: Spouse/significant other (It is also documented that pt lives alone) Available Help at Discharge: Other (Comment);Family;Available PRN/intermittently (Will need reliable info re: available home assist) Type of Home: House Home Access: Level entry (front is level; another entry has 8 steps per pt)     Home Layout: Two level;Able to live on main level with bedroom/bathroom;Laundry or  work area in basement     [employer] Shower/Tub: Tub/shower unit;Curtain Shower/tub characteristics: Engineer, building services: Standard     Home Equipment: Environmental consultant - 2 wheels;Shower seat;Grab bars - tub/shower (walking stick)          Prior Functioning/Environment Level of Independence: Independent        Comments: per note from 2 months ago, pt drives, but rarely    OT Diagnosis: Generalized weakness   OT Problem List: Decreased  strength;Decreased activity tolerance;Impaired balance (sitting and/or standing);Decreased safety awareness   OT Treatment/Interventions: Self-care/ADL training;DME and/or AE instruction;Patient/family education    OT Goals(Current goals can be found in the care plan section) Acute Rehab OT Goals Patient Stated Goal: get stonger and back home OT Goal Formulation: With patient Time For Goal Achievement: 07/11/14 Potential to Achieve Goals: Good ADL Goals Pt Will Perform Grooming: with supervision;standing Pt Will Perform Lower Body Dressing: with supervision;sit to/from stand Pt Will Transfer to Toilet: with supervision;ambulating;regular height toilet Pt Will Perform Toileting - Clothing Manipulation and hygiene: with supervision;sit to/from stand Pt Will Perform Tub/Shower Transfer: with supervision  OT Frequency: Min 2X/week   Barriers to D/C:            Co-evaluation              End of Session Nurse Communication: Mobility status  Activity Tolerance: Patient tolerated treatment well Patient left: in chair;with call bell/phone within reach   Time: 0454-0981 OT Time Calculation (min): 19 min Charges:  OT General Charges $OT Visit: 1 Procedure OT Evaluation $Initial OT Evaluation Tier I: 1 Procedure G-Codes:    Alba Cory 07-18-14, 1:51 PM

## 2014-06-27 NOTE — Clinical Social Work Note (Signed)
Clinical Social Work Assessment  Patient Details  Name: Juan [name] MRN: [id number] Date of Birth: [birth year]  Date of referral:  06/27/14               Reason for consult:  Substance Use/ETOH Abuse                Permission sought to share information with:   (did not seek permission to talk with anyone else) Permission granted to share information::  No  Name::        Agency::     Relationship::     Contact Information:     Housing/Transportation Living arrangements for the past 2 months:  Single Family Home Source of Information:  Patient Patient Interpreter Needed:  None Criminal Activity/Legal Involvement Pertinent to Current Situation/Hospitalization:  No - Comment as needed Significant Relationships:  Spouse Lives with:  Spouse Do you feel safe going back to the place where you live?  Yes Need for family participation in patient care:  No (Coment)  Care giving concerns:  Pt lives with spouse who expressed concerns about pt ETOH abuse- per RN, however, pt wife helps provide the pt will alcohol (reported that wife will get up in the middle of the night and provide pt with a beer)   Office manager / plan:  CSW spoke with pt concerning his alcohol abuse and his request for resources.  CSW completed SBIRT with the pt.  Employment status:  Retired Health and safety inspector:    PT Recommendations:  Not assessed at this time Information / Referral to community resources:  Outpatient Substance Abuse Treatment Options, SBIRT  Patient/Family's Response to care:  Patient is not enthusiastic about the prospect of attending rehab but states that his wife will not stop bugging him about it.  Pt does not report a strong desire to quite or cut back drinking for any reason but to appease his spouse.  Pt states that he drinks anywhere from 5-7 nights a week and has about 3 drinks on each occasion.  Pt is interested in outpatient options.  Pt reports that he has never attended a  rehab program in the past.  Pt states that he understands that his wife wants him to stop for health reasons.  Patient/Family's Understanding of and Emotional Response to Diagnosis, Current Treatment, and Prognosis:  Pt seems to have poor understanding of his drinking issues and their affect on his health.  Emotional Assessment Appearance:  Appears stated age Attitude/Demeanor/Rapport:    Affect (typically observed):  Calm, Appropriate, In denial Orientation:  Oriented to Self, Oriented to Place, Oriented to  Time, Oriented to Situation Alcohol / Substance use:  Alcohol Use Psych involvement (Current and /or in the community):  No (Comment)  Discharge Needs  Concerns to be addressed:  Substance Abuse Concerns Readmission within the last 30 days:  No Current discharge risk:  Substance Abuse Barriers to Discharge:  Continued Medical Work up   [employer], LCSW 06/27/2014, 2:33 PM

## 2014-06-28 LAB — CBC
HCT: 32.6 % — ABNORMAL LOW (ref 39.0–52.0)
HEMOGLOBIN: 11.1 g/dL — AB (ref 13.0–17.0)
MCH: 31.6 pg (ref 26.0–34.0)
MCHC: 34 g/dL (ref 30.0–36.0)
MCV: 92.9 fL (ref 78.0–100.0)
Platelets: 181 10*3/uL (ref 150–400)
RBC: 3.51 MIL/uL — AB (ref 4.22–5.81)
RDW: 14 % (ref 11.5–15.5)
WBC: 9.3 10*3/uL (ref 4.0–10.5)

## 2014-06-28 LAB — BASIC METABOLIC PANEL
Anion gap: 10 (ref 5–15)
BUN: 11 mg/dL (ref 6–20)
CO2: 23 mmol/L (ref 22–32)
Calcium: 8.5 mg/dL — ABNORMAL LOW (ref 8.9–10.3)
Chloride: 96 mmol/L — ABNORMAL LOW (ref 101–111)
Creatinine, Ser: 0.82 mg/dL (ref 0.61–1.24)
GFR calc Af Amer: >60 mL/min (ref >60)
GFR calc non Af Amer: >60 mL/min (ref >60)
Glucose, Bld: 117 mg/dL — ABNORMAL HIGH (ref 65–99)
Potassium: 4.8 mmol/L (ref 3.5–5.1)
Sodium: 129 mmol/L — ABNORMAL LOW (ref 135–145)

## 2014-06-28 MED ORDER — IPRATROPIUM-ALBUTEROL 20-100 MCG/ACT IN AERS
2.0000 | INHALATION_SPRAY | Freq: Three times a day (TID) | RESPIRATORY_TRACT | Status: DC
  Administered 2014-06-29 – 2014-06-30 (×4): 2 via RESPIRATORY_TRACT
  Filled 2014-06-28 (×2): qty 4

## 2014-06-28 MED ORDER — IPRATROPIUM-ALBUTEROL 0.5-2.5 (3) MG/3ML IN SOLN
RESPIRATORY_TRACT | Status: AC
  Administered 2014-06-28: 3 mL
  Filled 2014-06-28: qty 3

## 2014-06-28 MED ORDER — FLUTICASONE PROPIONATE 50 MCG/ACT NA SUSP
2.0000 | Freq: Every day | NASAL | Status: DC
  Filled 2014-06-28: qty 16

## 2014-06-28 MED ORDER — IPRATROPIUM-ALBUTEROL 0.5-2.5 (3) MG/3ML IN SOLN: 3.0000 mL | RESPIRATORY_TRACT | Status: DC | PRN

## 2014-06-28 NOTE — Progress Notes (Signed)
FMTS Attending Daily Note:   S:  Patient awake and alert, watching television. NAD, no complaints.  Exam:  Still somewhat disheveled looking but better than yesterday.  Dry mucus membranes.  Heart RRR.  Lungs with diffuse rhonchi throughout.  No crackles.  Abdomen with large protuberant hernia, nontender.  No drainage.  Extremities without edema.  Imp/Plan: 1.  Alcohol abuse: - again completely minimizes symptoms.  - continually reiterates it was "GI bug" and not alcohol toxicity which led to this most recent hospitalization.  Not interested in EtOH rehab - This is what has now unfortunately become the usual course for [name] -- interest in rehab during acute intoxication and immediate withdrawal period, changing to complete refusal of "any help" several days later. - Agree with Ativan per CIWA - I will meet with wife and patient tomorrow to lay out options.  Wife has expressed possibility she may live him, but fears for him killing himself.  2.  Bronchiectasis: - doesn't have true diagnosis of COPD - Continuing spiriva - needs to see pulm outpatient, but he continually cancels this appointments as well  3.  Dehydration: - still somewhat dry appearing.  Hyponatremia has dipped. - Doesn't have any fluids beside his bed - Encouraged to take PO.  Short-term increase in IV Fluids.  4.  Deconditioning: - appreciate PT input.  Home health PT versus SNF.  I assume he would refuse SNF, but this might be the best thing for him, especially if wife agrees she can't care for him at home.   Tobey Grim, MD 06/28/2014 8:50 AM

## 2014-06-28 NOTE — Progress Notes (Signed)
Physical Therapy Treatment Patient Details Name: Juan [name] MRN: [id number] DOB: 03-Jun-1950 Today's Date: 06/28/2014    History of Present Illness Juan [name] is a 64 y.o. male presenting with increasing falls and dehydration in the setting of an alcohol binge. PMH is significant for alcohol abuse, history of alcohol withdrawal seizures, CHF, anxiety, depression, HLD, HTN, asthma    PT Comments    Pt with ability to ambulate today but noticeable fall risk with slow gait speed, impaired ability to perform higher level balance deficits. Pt reports at least 4 falls in the last year related to ETOH and educated for need to cease drinking, use RW at all times and continue therapy for improved balance. Pt educated for hip and ankle strengthening to assist with balance reactions as well. Will continue to follow.   Follow Up Recommendations  Home health PT;Other (comment)     Equipment Recommendations       Recommendations for Other Services       Precautions / Restrictions Precautions Precautions: Fall    Mobility  Bed Mobility         Supine to sit: Supervision     General bed mobility comments: supervision for safety  Transfers Overall transfer level: Needs assistance   Transfers: Sit to/from Stand Sit to Stand: Supervision         General transfer comment: supervison for safety and mobility  Ambulation/Gait Ambulation/Gait assistance: Min guard Ambulation Distance (Feet): 400 Feet Assistive device: None Gait Pattern/deviations: Step-through pattern;Decreased stride length;Shuffle Gait velocity: 62ft/17 sec = 1.76 at increased fall risk Gait velocity interpretation: <1.8 ft/sec, indicative of risk for recurrent falls     Stairs            Wheelchair Mobility    Modified Rankin (Stroke Patients Only)       Balance Overall balance assessment: Needs assistance   Sitting balance-Leahy Scale: Good       Standing balance-Leahy Scale:  Poor   Single Leg Stance - Right Leg: 2 Single Leg Stance - Left Leg: 2 Tandem Stance - Right Leg: 10 Tandem Stance - Left Leg: 10   Rhomberg - Eyes Closed: 4   High Level Balance Comments: pt able to turn 360 degrees bil in 6 sec, pt with inability to fully step heel to toe with sharpened Rhomberg but was able to step forward without assist. Unable to complete full Berg secondary to fatigue    Cognition Arousal/Alertness: Awake/alert Behavior During Therapy: Flat affect Overall Cognitive Status: Within Functional Limits for tasks assessed                      Exercises General Exercises - Lower Extremity Hip Flexion/Marching: AROM;Seated;Both;20 reps Toe Raises: AROM;Seated;Both;20 reps    General Comments        Pertinent Vitals/Pain Pain Assessment: No/denies pain    Home Living                      Prior Function            PT Goals (current goals can now be found in the care plan section) Progress towards PT goals: Progressing toward goals    Frequency       PT Plan Current plan remains appropriate    Co-evaluation             End of Session Equipment Utilized During Treatment: Gait belt Activity Tolerance: Patient tolerated treatment well Patient left: in chair;with call  bell/phone within reach;with chair alarm set     Time: [phone] PT Time Calculation (min) (ACUTE ONLY): 18 min  Charges:  $Gait Training: 8-22 mins                    G Codes:      Delorse Lek 07/25/14, 10:41 AM Delaney Meigs, PT [phone]

## 2014-06-28 NOTE — Progress Notes (Signed)
Family Medicine Teaching Service Daily Progress Note Intern Pager: [phone]  Patient name: Juan [name] Medical record number: [id number] Date of birth: 02/23/1950 Age: 64 y.o. Gender: male  Primary Care Provider: Renold Don, MD Consultants: None Code Status: Full  Pt Overview and Major Events to Date:  5/19 - Admitted with Falls and dehydration secondary to alcohol abuse 5/21: ~ 48 hrs after last drink; CIWA 22 this morning. Schedule Ativan 2 mg q 6hrs. Monitor for DTs 5/23 - Withdrawal symptoms improving CIWA 2-5. Transferred to floor.   Assessment and Plan: Juan [name] is a 65 y.o. male presenting with increasing falls and dehydration in the setting of an alcohol binge. PMH is significant for alcohol abuse, history of alcohol withdrawal seizures, CHF, anxiety, depression, HLD, HTN, asthma  Alcohol abuse. Patient noted to be tachycardic with lactic acidosis on admission, likely secondary to dehydration due to current alcohol binge (now resolved). Has strong history of alcohol abuse and was admitted 3 months ago for seizure secondary to withdrawals. Last drank on 5/19.  - CIWA - Patient initially reported interest in alcohol cessation, but now denying = Social work consulted - Patient in pre-contemplative phase regarding alcohol abuse, does not seem to attribute frequent hospitalizations and medical problems to alcohol abuse - PT/OT consulted: HH vs SNF  COPD with history of aspiration pneumonia. Did not have signs of acute exacerbation on admission and was stable on room air with negative CXR. Now with worsened wheezing and SOB. Repeat CXR negative. Mild fever on 5/23.  - Continue home spiriva, combivent, and singulair - Levofloxacin (5/22- ) - Prednisone (5/22- ) - Consider pulm referral as outpatient  Hyponatremia. Chronically low 128-131. - Continue to monitor.  - Hold home diuretics today.   History of HFpEF / HTN / HLD. Echo 04/2014 with EF 55-60%. Did not appear to  be volume overloaded on admission and was near dry weight (184-188lb). Repeat BNP mildly elevated (574) - Increase home dose of coreg to 25mg  bid, and decrease home lisinopril to 5mg  daily.  - Holding home spironolactone and lasix.  - Continue ASA, statin  Hepatic steatosis: Noted on Abdominal U/S. Now with elevated hepatic enzymes consistent with alcohol abuse (AST 210, ALT 88 on admission). LFTs normal last month.  - Continue to monitor  - Hep Panel (A, B, and C) negative March 2015 - Coags normal  Anxiety/depression - Continue home Cymbalta - Discontinuing home klonopin while on CIWA - would consider stopping as outpatient given history of alcoholism  Tobacco Abuse - Cessation counseling  FEN/GI: Heart healthy diet, SLIV Prophylaxis: lovenox  Disposition: Admitted pending above management. Anticipate discharge later today or tomorrow.   Subjective:  Doing well this morning. Continues to have a small amount of anxiety. No further tremors. No chest pain or shortness of breath. CIWA scores 2-5 overnight. Required 1 dose of prn Ativan.   Objective: Temp:  [98.2 F (36.8 C)-98.8 F (37.1 C)] 98.2 F (36.8 C) (05/24 0546) Pulse Rate:  [67-90] 72 (05/24 0546) Resp:  [14-26] 20 (05/24 0546) BP: (122-154)/(73-90) 154/79 mmHg (05/24 0546) SpO2:  [94 %-97 %] 96 % (05/24 0546) Weight:  [187 lb 9.6 oz (85.095 kg)-189 lb 6 oz (85.9 kg)] 189 lb 6 oz (85.9 kg) (05/24 0155) Physical Exam: General: 64 year old unkempt appearing man lying in hospital bed in NAD Cardiovascular: RRR, no murmurs appreciated Respiratory: NWOB, CTAB Abdomen: protuberant umbilical hernia noted. +BS, s, nt, nd,  Extremities: WWP Neuro: Alert and conversational. No asterixis. No  focal neurological deficits noted.   Laboratory/Imaging/Diagnostic Tests:  Recent Labs Lab 06/26/14 0320 06/27/14 0235 06/28/14 0330  WBC 6.8 8.0 9.3  HGB 9.7* 10.7* 11.1*  HCT 29.0* 31.1* 32.6*  PLT 142* 153 181    Recent  Labs Lab 06/23/14 1641  06/25/14 0224 06/26/14 0320 06/27/14 0235 06/28/14 0330  NA 130*  < > 133* 130* 128* 129*  K 4.4  < > 3.5 2.9* 3.7 4.8  CL 92*  < > 102 98* 95* 96*  CO2 21*  < > BUN 9  < > 5* <5* <5* 11  CREATININE 0.85  < > 0.87 0.78 0.80 0.82  CALCIUM 8.0*  < > 7.1* 7.1* 7.5* 8.5*  PROT 6.6  --  5.2* 5.5*  --   --   BILITOT 0.8  --  1.6* 1.5*  --   --   ALKPHOS 150*  --  100 98  --   --   ALT 88*  --  59 57  --   --   AST 210*  --  144* 118*  --   --   GLUCOSE 143*  < > 107* 106* 110* 117*  < > = values in this interval not displayed.  Juan Dark, MD 06/28/2014, 7:23 AM PGY-1, [facility] FPTS Intern pager: [phone], text pages welcome

## 2014-06-28 NOTE — Progress Notes (Signed)
Patient noted to have requested prn combivent inhaler on several occasions. Patient states he uses his home  inaler at least 3 times every day. RT treatment protocol assessment completed. Orders changed accordinglyy.

## 2014-06-29 DIAGNOSIS — W19XXXA Unspecified fall, initial encounter: Secondary | ICD-10-CM | POA: Insufficient documentation

## 2014-06-29 LAB — BASIC METABOLIC PANEL
Anion gap: 10 (ref 5–15)
BUN: 15 mg/dL (ref 6–20)
CHLORIDE: 95 mmol/L — AB (ref 101–111)
CO2: 26 mmol/L (ref 22–32)
Calcium: 9.1 mg/dL (ref 8.9–10.3)
Creatinine, Ser: 1.02 mg/dL (ref 0.61–1.24)
GFR calc Af Amer: >60 mL/min (ref >60)
Glucose, Bld: 123 mg/dL — ABNORMAL HIGH (ref 65–99)
POTASSIUM: 5 mmol/L (ref 3.5–5.1)
Sodium: 131 mmol/L — ABNORMAL LOW (ref 135–145)

## 2014-06-29 MED ORDER — SPIRONOLACTONE 12.5 MG HALF TABLET
12.5000 mg | ORAL_TABLET | Freq: Every day | ORAL | Status: DC
  Administered 2014-06-29 – 2014-06-30 (×2): 12.5 mg via ORAL
  Filled 2014-06-29 (×2): qty 1

## 2014-06-29 MED ORDER — POTASSIUM CHLORIDE CRYS ER 20 MEQ PO TBCR
40.0000 meq | EXTENDED_RELEASE_TABLET | Freq: Every day | ORAL | Status: DC
  Administered 2014-06-30: 40 meq via ORAL
  Filled 2014-06-29: qty 2

## 2014-06-29 MED ORDER — FUROSEMIDE 40 MG PO TABS
40.0000 mg | ORAL_TABLET | Freq: Every day | ORAL | Status: DC
  Administered 2014-06-29 – 2014-06-30 (×2): 40 mg via ORAL
  Filled 2014-06-29 (×2): qty 1

## 2014-06-29 MED ORDER — FLUTICASONE PROPIONATE 50 MCG/ACT NA SUSP
2.0000 | Freq: Two times a day (BID) | NASAL | Status: DC | PRN
  Administered 2014-06-29 (×2): 2 via NASAL
  Filled 2014-06-29: qty 16

## 2014-06-29 NOTE — Progress Notes (Signed)
Pt lying in bed wife at the bedside, wife states he is very upset and needs something to relax him when available. Pt appears calm laying in bed. Informed pt what time his medications are due.

## 2014-06-29 NOTE — Progress Notes (Signed)
Patient in bed. Wife called to check on patient for she wanted to know if he was okay. Wife stated he has been very depressed and very mad today while she was here with him at the bedside. Currently patient has a flat affect but doesn't appear to be upset or mad. Patient is complian with taking medications and complains of no pain at this time. Patient does state he feels anxious and Ativan given per PRN order. Will continue to monitor patient to end of shift

## 2014-06-29 NOTE — Progress Notes (Signed)
Occupational Therapy Treatment Patient Details Name: Juan [name] MRN: [id number] DOB: Feb 17, 1950 Today's Date: 06/29/2014    History of present illness CARTRELL BENTSEN is a 64 y.o. male presenting with increasing falls and dehydration in the setting of an alcohol binge. PMH is significant for alcohol abuse, history of alcohol withdrawal seizures, CHF, anxiety, depression, HLD, HTN, asthma   OT comments  Much improved this visit  Follow Up Recommendations  Home health OT;Supervision/Assistance - 24 hour;Supervision - Intermittent (depending on progress and care at home)    Equipment Recommendations  3 in 1 bedside comode    Recommendations for Other Services      Precautions / Restrictions         Mobility Bed Mobility         Supine to sit: Supervision     General bed mobility comments: supervision for safety  Transfers Overall transfer level: Needs assistance     Sit to Stand: Min guard         General transfer comment: Vc to widen BOS    Balance                                   ADL                               Toileting- Clothing Manipulation and Hygiene: Min guard;Sit to/from stand;Cueing for sequencing;Cueing for safety       Functional mobility during ADLs: Min guard;Cueing for sequencing;Cueing for safety General ADL Comments: encouraged use of RW.  pts feet very close together .  educated on risk of falls with feet so close together.        Vision                     Perception     Praxis      Cognition   Behavior During Therapy: Flat affect Overall Cognitive Status: Within Functional Limits for tasks assessed                       Extremity/Trunk Assessment               Exercises     Shoulder Instructions       General Comments      Pertinent Vitals/ Pain       Pain Assessment: No/denies pain  Home Living                                          Prior Functioning/Environment              Frequency Min 2X/week     Progress Toward Goals  OT Goals(current goals can now be found in the care plan section)  Progress towards OT goals: Progressing toward goals     Plan Discharge plan needs to be updated    Co-evaluation                 End of Session     Activity Tolerance Patient tolerated treatment well   Patient Left in chair;with call bell/phone within reach   Nurse Communication Mobility status        Time: 1324-4010 OT Time Calculation (min): 18 min  Charges: OT General  Charges $OT Visit: 1 Procedure OT Treatments $Self Care/Home Management : 8-22 mins  Azaryah Heathcock D 06/29/2014, 9:30 AM

## 2014-06-29 NOTE — Progress Notes (Signed)
Family Medicine Teaching Service Daily Progress Note Intern Pager: [phone]  Patient name: Juan [name] Medical record number: [id number] Date of birth: Mar 08, 1950 Age: 64 y.o. Gender: male  Primary Care Provider: Renold Don, MD Consultants: None Code Status: Full  Pt Overview and Major Events to Date:  5/19 - Admitted with Falls and dehydration secondary to alcohol abuse 5/21: ~ 48 hrs after last drink; CIWA 22 this morning. Schedule Ativan 2 mg q 6hrs. Monitor for DTs 5/23 - Withdrawal symptoms improving CIWA 2-5. Transferred to floor.   Assessment and Plan: Juan [name] is a 64 y.o. male presenting with increasing falls and dehydration in the setting of an alcohol binge. PMH is significant for alcohol abuse, history of alcohol withdrawal seizures, CHF, anxiety, depression, HLD, HTN, asthma  Alcohol abuse. Patient noted to be tachycardic with lactic acidosis on admission, likely secondary to dehydration due to current alcohol binge (now resolved). Has strong history of alcohol abuse and was admitted 3 months ago for seizure secondary to withdrawals. Last drank on 5/19.  - CIWA - Patient initially reported interest in alcohol cessation, but now denying = Social work consulted - Patient in pre-contemplative phase regarding alcohol abuse, does not seem to attribute frequent hospitalizations and medical problems to alcohol abuse - PT/OT consulted: HH  COPD with history of aspiration pneumonia. Did not have signs of acute exacerbation on admission and was stable on room air with negative CXR. Now with worsened wheezing and SOB. Repeat CXR negative. Mild fever on 5/23.  - Continue home spiriva, combivent, and singulair - Levofloxacin (5/22- ) - Prednisone (5/22- ) - Consider pulm referral as outpatient  Hyponatremia. Chronically low 128-131. - Continue to monitor.   History of HFpEF / HTN / HLD. Echo 04/2014 with EF 55-60%. Did not appear to be volume overloaded on admission and  was near dry weight (184-188lb). Repeat BNP mildly elevated (574) - Increase home dose of coreg to 25mg  bid, and decrease home lisinopril to 5mg  daily.  - Restart home spironolactone and lasix.  - Continue ASA, statin  Hepatic steatosis: Noted on Abdominal U/S. Now with elevated hepatic enzymes consistent with alcohol abuse (AST 210, ALT 88 on admission). LFTs normal last month.  - Continue to monitor  - Hep Panel (A, B, and C) negative March 2015 - Coags normal  Anxiety/depression - Continue home Cymbalta - Discontinuing home klonopin while on CIWA - would consider stopping as outpatient given history of alcoholism  Tobacco Abuse - Cessation counseling  FEN/GI: Heart healthy diet, SLIV Prophylaxis: lovenox  Disposition: Admitted pending above management. Anticipate discharge later today or tomorrow.   Subjective:  Some anxiety overnight, otherwise doing well. CIWA 2-6. No chest pain or shortness of breath. Did have a mild cough. No other complaints.   Objective: Temp:  [98.1 F (36.7 C)-98.6 F (37 C)] 98.6 F (37 C) (05/25 0511) Pulse Rate:  [66-81] 68 (05/25 0511) Resp:  [18] 18 (05/25 0511) BP: (106-142)/(64-84) 131/83 mmHg (05/25 0511) SpO2:  [96 %-98 %] 96 % (05/25 0511) Weight:  [185 lb 4.8 oz (84.052 kg)] 185 lb 4.8 oz (84.052 kg) (05/25 0511) Physical Exam: General: 64 year old unkempt appearing man lying in hospital bed in NAD Cardiovascular: RRR, no murmurs appreciated Respiratory: NWOB, CTAB Abdomen: protuberant umbilical hernia noted. +BS, s, nt, nd,  Extremities: WWP Neuro: Alert and conversational. No asterixis. No focal neurological deficits noted.   Laboratory/Imaging/Diagnostic Tests:  Recent Labs Lab 06/26/14 0320 06/27/14 0235 06/28/14 0330  WBC 6.8 8.0  9.3  HGB 9.7* 10.7* 11.1*  HCT 29.0* 31.1* 32.6*  PLT 142* 153 181    Recent Labs Lab 06/23/14 1641  06/25/14 0224 06/26/14 0320 06/27/14 0235 06/28/14 0330 06/29/14 0418  NA 130*  <  > 133* 130* 128* 129* 131*  K 4.4  < > 3.5 2.9* 3.7 4.8 5.0  CL 92*  < > 102 98* 95* 96* 95*  CO2 21*  < > BUN 9  < > 5* <5* <5* 11 15  CREATININE 0.85  < > 0.87 0.78 0.80 0.82 1.02  CALCIUM 8.0*  < > 7.1* 7.1* 7.5* 8.5* 9.1  PROT 6.6  --  5.2* 5.5*  --   --   --   BILITOT 0.8  --  1.6* 1.5*  --   --   --   ALKPHOS 150*  --  100 98  --   --   --   ALT 88*  --  59 57  --   --   --   AST 210*  --  144* 118*  --   --   --   GLUCOSE 143*  < > 107* 106* 110* 117* 123*  < > = values in this interval not displayed.  Ardith Dark, MD 06/29/2014, 7:19 AM PGY-1, [place] FPTS Intern pager: [phone], text pages welcome

## 2014-06-30 ENCOUNTER — Inpatient Hospital Stay: Payer: Self-pay | Admitting: Family Medicine

## 2014-06-30 LAB — BASIC METABOLIC PANEL
ANION GAP: 10 (ref 5–15)
BUN: 17 mg/dL (ref 6–20)
CHLORIDE: 92 mmol/L — AB (ref 101–111)
CO2: 29 mmol/L (ref 22–32)
CREATININE: 1.01 mg/dL (ref 0.61–1.24)
Calcium: 8.9 mg/dL (ref 8.9–10.3)
GFR calc Af Amer: >60 mL/min (ref >60)
GFR calc non Af Amer: >60 mL/min (ref >60)
GLUCOSE: 143 mg/dL — AB (ref 65–99)
Potassium: 4.4 mmol/L (ref 3.5–5.1)
SODIUM: 131 mmol/L — AB (ref 135–145)

## 2014-06-30 MED ORDER — LORAZEPAM 1 MG PO TABS
1.0000 mg | ORAL_TABLET | Freq: Two times a day (BID) | ORAL | Status: DC
  Administered 2014-06-30: 1 mg via ORAL
  Filled 2014-06-30: qty 1

## 2014-06-30 MED ORDER — LISINOPRIL 5 MG PO TABS: 5.0000 mg | ORAL_TABLET | Freq: Every day | ORAL | Status: DC

## 2014-06-30 MED ORDER — CARVEDILOL 25 MG PO TABS: 25.0000 mg | ORAL_TABLET | Freq: Two times a day (BID) | ORAL | Status: DC

## 2014-06-30 MED ORDER — LEVOFLOXACIN 500 MG PO TABS
500.0000 mg | ORAL_TABLET | Freq: Every day | ORAL | Status: DC
Start: 2014-06-30 — End: 2014-07-20

## 2014-06-30 MED ORDER — CLONAZEPAM 0.5 MG PO TABS: 0.5000 mg | ORAL_TABLET | Freq: Two times a day (BID) | ORAL | Status: DC | PRN

## 2014-06-30 NOTE — Progress Notes (Signed)
D/c instructions given to and d/w pt. Pt verbalizes understanding 

## 2014-06-30 NOTE — Progress Notes (Signed)
Family Medicine Teaching Service Daily Progress Note Intern Pager: [phone]  Patient name: Juan [name] Medical record number: [id number] Date of birth: 1950/10/19 Age: 64 y.o. Gender: male  Primary Care Provider: Renold Don, MD Consultants: None Code Status: Full  Pt Overview and Major Events to Date:  5/19 - Admitted with Falls and dehydration secondary to alcohol abuse 5/21: ~ 48 hrs after last drink; CIWA 22 this morning. Schedule Ativan 2 mg q 6hrs. Monitor for DTs 5/23 - Withdrawal symptoms improving CIWA 2-5. Transferred to floor.   Assessment and Plan: Juan [name] is a 64 y.o. male presenting with increasing falls and dehydration in the setting of an alcohol binge. PMH is significant for alcohol abuse, history of alcohol withdrawal seizures, CHF, anxiety, depression, HLD, HTN, asthma  Alcohol abuse. Patient noted to be tachycardic with lactic acidosis on admission, likely secondary to dehydration due to current alcohol binge (now resolved). Has strong history of alcohol abuse and was admitted 3 months ago for seizure secondary to withdrawals. Last drank on 5/19.  - CIWA - Patient in pre-contemplative phase regarding alcohol abuse, does not seem to attribute frequent hospitalizations and medical problems to alcohol abuse - Patient currently contemplating rehab options.  - PT/OT consulted: HH  COPD with history of aspiration pneumonia. Did not have signs of acute exacerbation on admission and was stable on room air with negative CXR. Now with worsened wheezing and SOB. Repeat CXR negative. Mild fever on 5/23.  - Continue home spiriva, combivent, and singulair - Levofloxacin (5/22-5/28) - Prednisone (5/22-5/26) - Consider pulm referral as outpatient  Hyponatremia. Chronically low 128-131. - Continue to monitor.   History of HFpEF / HTN / HLD. Echo 04/2014 with EF 55-60%. Did not appear to be volume overloaded on admission and was near dry weight (184-188lb). Repeat BNP  mildly elevated (574) - Increase home dose of coreg to 25mg  bid, and decrease home lisinopril to 5mg  daily.  - Restart home spironolactone and lasix.  - Continue ASA, statin  Hepatic steatosis: Noted on Abdominal U/S. Now with elevated hepatic enzymes consistent with alcohol abuse (AST 210, ALT 88 on admission). LFTs normal last month.  - Continue to monitor  - Hep Panel (A, B, and C) negative March 2015 - Coags normal  Anxiety/depression - Continue home Cymbalta - Discontinuing home klonopin while on CIWA - would consider stopping as outpatient given history of alcoholism  Tobacco Abuse - Cessation counseling  FEN/GI: Heart healthy diet, SLIV Prophylaxis: lovenox  Disposition: Admitted pending above management. Anticipate discharge later today or tomorrow.   Subjective:  Continues to have anxiety overnight. States that he will look into going to [facility] for rehab after discharge. Also states that his wife is looking into other options for him. No withdrawal symptoms overnight. CIWA 2-6.   Objective: Temp:  [97.9 F (36.6 C)-98.4 F (36.9 C)] 97.9 F (36.6 C) (05/26 0239) Pulse Rate:  [70-75] 70 (05/26 0239) Resp:  [18] 18 (05/26 0239) BP: (104-122)/(61-71) 122/71 mmHg (05/26 0239) SpO2:  [96 %-98 %] 96 % (05/26 0239) Physical Exam: General: 64 year old unkempt appearing man lying in hospital bed in NAD Cardiovascular: RRR, no murmurs appreciated Respiratory: NWOB, CTAB Abdomen: protuberant umbilical hernia noted. +BS, s, nt, nd,  Extremities: WWP Neuro: Alert and conversational. No focal neurological deficits noted.   Laboratory/Imaging/Diagnostic Tests:  Recent Labs Lab 06/26/14 0320 06/27/14 0235 06/28/14 0330  WBC 6.8 8.0 9.3  HGB 9.7* 10.7* 11.1*  HCT 29.0* 31.1* 32.6*  PLT 142*  153 181    Recent Labs Lab 06/23/14 1641  06/25/14 0224 06/26/14 0320  06/28/14 0330 06/29/14 0418 06/30/14 0344  NA 130*  < > 133* 130*  < > 129* 131* 131*  K 4.4  <  > 3.5 2.9*  < > 4.8 5.0 4.4  CL 92*  < > 102 98*  < > 96* 95* 92*  CO2 21*  < > 24 22  < > BUN 9  < > 5* <5*  < > CREATININE 0.85  < > 0.87 0.78  < > 0.82 1.02 1.01  CALCIUM 8.0*  < > 7.1* 7.1*  < > 8.5* 9.1 8.9  PROT 6.6  --  5.2* 5.5*  --   --   --   --   BILITOT 0.8  --  1.6* 1.5*  --   --   --   --   ALKPHOS 150*  --  100 98  --   --   --   --   ALT 88*  --  59 57  --   --   --   --   AST 210*  --  144* 118*  --   --   --   --   GLUCOSE 143*  < > 107* 106*  < > 117* 123* 143*  < > = values in this interval not displayed.  Ardith Dark, MD 06/30/2014, 7:32 AM PGY-1, [facility] Family Medicine FPTS Intern pager: [phone], text pages welcome

## 2014-07-05 ENCOUNTER — Other Ambulatory Visit: Payer: Self-pay | Admitting: Family Medicine

## 2014-07-11 DIAGNOSIS — E872 Acidosis, unspecified: Secondary | ICD-10-CM | POA: Insufficient documentation

## 2014-07-12 ENCOUNTER — Encounter: Payer: Self-pay | Admitting: Family Medicine

## 2014-07-12 ENCOUNTER — Ambulatory Visit (INDEPENDENT_AMBULATORY_CARE_PROVIDER_SITE_OTHER): Payer: No Typology Code available for payment source | Admitting: Family Medicine

## 2014-07-12 VITALS — BP 125/75 | HR 85 | Temp 97.9°F | Ht 68.0 in | Wt 188.5 lb

## 2014-07-12 DIAGNOSIS — F1010 Alcohol abuse, uncomplicated: Secondary | ICD-10-CM | POA: Diagnosis not present

## 2014-07-12 DIAGNOSIS — J471 Bronchiectasis with (acute) exacerbation: Secondary | ICD-10-CM | POA: Diagnosis not present

## 2014-07-12 MED ORDER — ALBUTEROL SULFATE (2.5 MG/3ML) 0.083% IN NEBU: 2.5000 mg | INHALATION_SOLUTION | Freq: Four times a day (QID) | RESPIRATORY_TRACT | Status: DC | PRN

## 2014-07-12 MED ORDER — IPRATROPIUM-ALBUTEROL 20-100 MCG/ACT IN AERS: INHALATION_SPRAY | RESPIRATORY_TRACT | Status: DC

## 2014-07-12 NOTE — Progress Notes (Signed)
Subjective:    Juan [name] is a 64 y.o. male who presents to [facility] today for FU for hospitalization for alcohol W/D:  1.  Alcoholism:  Has not had any EtOH since leaving the hospital.  Did NOT go to rehab at [facility].  States that having his wife home during the day (she is retired) is helpful.  Denies any craving for EtOH. States that wife "is tickled" with his progress and asked why he didn't quit years ago.  No seizure like activity.   2.  Breathing difficulties:  States baseline dypsnea has not really improved since leaving hospital.  He was sent home on Levaquin and Prednisone for presumed COPD exacerbation.  States he's taking the rest of his medicines as prescribed. Has never seen a pulmonologist.   Describes as "breathing attacks."  Describes shortness of breath, increased work of breathing.  Feels "terrible." Only relief is "sitting still."  Recurs as soon as he starts moving again.  Describes cough as productive of thick mucus, initially yellow and now white phlegm.  Denies LE edema.  No chest pain, no palpitations.    Has been taking OTC cough syrup (unknown), using home Combivent multiple times a day. Using Spiriva and Flonase plus Singulair every morning.     Prev health:  Patient needs colonoscopy -- which he has consistently refused.    ROS as above per HPI, otherwise neg.    The following portions of the patient's history were reviewed and updated as appropriate: allergies, current medications, past medical history, family and social history, and problem list. Patient is a nonsmoker.    PMH reviewed.  Past Medical History  Diagnosis Date  . CHF (congestive heart failure) 10/2010    ECHO:  EF 40%, Grade II diastolic dysfunction  . Alcoholism   . Allergy     seasonal   . Anxiety   . Depression   . Asthma   . Hyperlipidemia   . Hypertension   . Seizures   . COPD (chronic obstructive pulmonary disease)    Past Surgical History  Procedure Laterality Date  . Removal of  ingrown toenail      Medications reviewed. Current Outpatient Prescriptions  Medication Sig Dispense Refill  . acetaminophen (TYLENOL) 500 MG tablet Take 1,500 mg by mouth every 6 (six) hours as needed for headache (headache).     Marland Kitchen aspirin EC 81 MG EC tablet Take 1 tablet (81 mg total) by mouth daily. 90 tablet 0  . atorvastatin (LIPITOR) 40 MG tablet Take 1 tablet (40 mg total) by mouth daily at 6 PM. 30 tablet 6  . B-Complex CAPS Take 1 capsule by mouth daily.    . carvedilol (COREG) 25 MG tablet Take 1 tablet (25 mg total) by mouth 2 (two) times daily with a meal. 60 tablet 0  . clonazePAM (KLONOPIN) 0.5 MG tablet TAKE ONE TABLET BY MOUTH TWICE DAILY AS NEEDED FOR ANXIETY 60 tablet 1  . dicyclomine (BENTYL) 20 MG tablet Take 1 tablet (20 mg total) by mouth 3 (three) times daily before meals. (Patient taking differently: Take 20 mg by mouth 3 (three) times daily with meals as needed for spasms. ) 90 tablet 0  . DULoxetine (CYMBALTA) 30 MG capsule Take 1 capsule (30 mg total) by mouth daily. 30 capsule 3  . fluticasone (FLONASE) 50 MCG/ACT nasal spray Use two sprays in each nostril daily 16 g 11  . furosemide (LASIX) 40 MG tablet Take 1 tablet (40 mg total) by mouth daily.  30 tablet 6  . gabapentin (NEURONTIN) 300 MG capsule TAKE ONE CAPSULE BY MOUTH THREE TIMES DAILY  (Patient not taking: Reported on 06/24/2014) 90 capsule 6  . Ipratropium-Albuterol (COMBIVENT RESPIMAT) 20-100 MCG/ACT AERS respimat INHALE TWO PUFFS BY MOUTH EVERY FOUR HOURS as needed for wheezing 4 g 3  . levofloxacin (LEVAQUIN) 500 MG tablet Take 1 tablet (500 mg total) by mouth daily. 2 tablet 0  . lisinopril (PRINIVIL,ZESTRIL) 5 MG tablet Take 1 tablet (5 mg total) by mouth daily. 30 tablet 0  . Magnesium 500 MG TABS Take 500 mg by mouth daily.    . montelukast (SINGULAIR) 10 MG tablet Take 1 tablet (10 mg total) by mouth at bedtime. 30 tablet 3  . Multiple Vitamin (MULTIVITAMIN WITH MINERALS) TABS tablet Take 1 tablet by  mouth daily.    . potassium chloride SA (K-DUR,KLOR-CON) 20 MEQ tablet Take 2 tablets (40 mEq total) by mouth daily. 60 tablet 6  . sodium chloride 1 G tablet Take 1 g by mouth 2 (two) times daily with a meal.    . spironolactone (ALDACTONE) 25 MG tablet TAKE HALF TABLET BY MOUTH DAILY  15 tablet 5  . thiamine 100 MG tablet Take 1 tablet (100 mg total) by mouth daily. 30 tablet 0  . tiotropium (SPIRIVA) 18 MCG inhalation capsule Place 1 capsule (18 mcg total) into inhaler and inhale daily. 30 capsule 6  . triamcinolone (KENALOG) 0.025 % ointment Apply 1 application topically 2 (two) times daily. (Patient taking differently: Apply 1 application topically 2 (two) times daily as needed (for rash). ) 30 g 0  . Vitamin D, Ergocalciferol, (DRISDOL) 50000 UNITS CAPS capsule TAKE 1 CAPSULE BY MOUTH EVERY 7 DAYS. 6 capsule 0  . [DISCONTINUED] albuterol (PROVENTIL,VENTOLIN) 90 MCG/ACT inhaler Inhale 2 puffs into the lungs every 6 (six) hours as needed for wheezing. 17 g 0   No current facility-administered medications for this visit.     Objective:   Physical Exam BP 125/75 mmHg  Pulse 85  Temp(Src) 97.9 F (36.6 C) (Oral)  Ht  (1.727 m)  Wt 188 lb 8 oz (85.503 kg)  BMI 28.67 kg/m2 Gen:  Alert, cooperative patient who appears stated age in no acute distress.  Vital signs reviewed. HEENT: EOMI,  MMM Cardiac:  Regular rate and rhythm  Pulm:  Diffuse wheezing all lung fields.  Poor air movement at bases.  Abd:  NO change to abdominal hernia.  No drainage or pain.  Exts: Trace BL LE edema -- better than usual exam  No results found for this or any previous visit (from the past 72 hour(s)).

## 2014-07-12 NOTE — Patient Instructions (Addendum)
I am sending in for a nebulizer for you to use at home as well.   I will need to fax the machine in for you.    Come back and see me in 3-4 weeks to make sure the breathing is getting better.   I'm glad to hear that you're able to stay away from the alcohol!

## 2014-07-14 NOTE — Assessment & Plan Note (Signed)
Pulse ox remained good with ambulation. Albuterol/atrovent treatment provided here for relief.  Prescription for nebulizer -- when he is really struggling I'm unclear how much air he is able to take in with the MDI.   Would benefit from Pulmonologist referral.  Plan referral today -- has declined in past but current symptoms are distressing enough he seems he will follow through now.

## 2014-07-14 NOTE — Assessment & Plan Note (Signed)
Hopeful that he will continue remaining abstinent.  I am concerned about what he will do during the day when his wife goes back for relief work 3 months this summer  This will be real test for him.  Still declines any "help with alcohol."  Feels he can do this on his own. Encouraged to obtain services.  FU in 2-3 weeks or sooner if dyspnea doesn't resolve.

## 2014-07-17 ENCOUNTER — Inpatient Hospital Stay (HOSPITAL_COMMUNITY)
Admission: EM | Admit: 2014-07-17 | Discharge: 2014-07-20 | DRG: 191 | Disposition: A | Discharge status: Home / Self Care | Payer: No Typology Code available for payment source | Attending: Family Medicine | Admitting: Family Medicine

## 2014-07-17 ENCOUNTER — Encounter (HOSPITAL_COMMUNITY): Payer: Self-pay | Admitting: Emergency Medicine

## 2014-07-17 DIAGNOSIS — R000 Tachycardia, unspecified: Secondary | ICD-10-CM | POA: Diagnosis present

## 2014-07-17 DIAGNOSIS — F17210 Nicotine dependence, cigarettes, uncomplicated: Secondary | ICD-10-CM | POA: Diagnosis present

## 2014-07-17 DIAGNOSIS — R0602 Shortness of breath: Secondary | ICD-10-CM | POA: Diagnosis not present

## 2014-07-17 DIAGNOSIS — F1011 Alcohol abuse, in remission: Secondary | ICD-10-CM | POA: Insufficient documentation

## 2014-07-17 DIAGNOSIS — I10 Essential (primary) hypertension: Secondary | ICD-10-CM | POA: Diagnosis present

## 2014-07-17 DIAGNOSIS — Z79899 Other long term (current) drug therapy: Secondary | ICD-10-CM

## 2014-07-17 DIAGNOSIS — E785 Hyperlipidemia, unspecified: Secondary | ICD-10-CM | POA: Diagnosis present

## 2014-07-17 DIAGNOSIS — J9600 Acute respiratory failure, unspecified whether with hypoxia or hypercapnia: Secondary | ICD-10-CM | POA: Diagnosis present

## 2014-07-17 DIAGNOSIS — J80 Acute respiratory distress syndrome: Secondary | ICD-10-CM | POA: Diagnosis present

## 2014-07-17 DIAGNOSIS — T380X5A Adverse effect of glucocorticoids and synthetic analogues, initial encounter: Secondary | ICD-10-CM | POA: Diagnosis present

## 2014-07-17 DIAGNOSIS — J441 Chronic obstructive pulmonary disease with (acute) exacerbation: Secondary | ICD-10-CM | POA: Diagnosis not present

## 2014-07-17 DIAGNOSIS — E871 Hypo-osmolality and hyponatremia: Secondary | ICD-10-CM | POA: Diagnosis present

## 2014-07-17 DIAGNOSIS — Z7982 Long term (current) use of aspirin: Secondary | ICD-10-CM

## 2014-07-17 DIAGNOSIS — I5042 Chronic combined systolic (congestive) and diastolic (congestive) heart failure: Secondary | ICD-10-CM | POA: Diagnosis present

## 2014-07-17 DIAGNOSIS — F419 Anxiety disorder, unspecified: Secondary | ICD-10-CM | POA: Diagnosis present

## 2014-07-17 MED ORDER — IPRATROPIUM BROMIDE 0.02 % IN SOLN
1.0000 mg | Freq: Once | RESPIRATORY_TRACT | Status: AC
  Administered 2014-07-18: 1 mg via RESPIRATORY_TRACT
  Filled 2014-07-17: qty 5

## 2014-07-17 MED ORDER — ALBUTEROL (5 MG/ML) CONTINUOUS INHALATION SOLN
10.0000 mg/h | INHALATION_SOLUTION | RESPIRATORY_TRACT | Status: DC
  Administered 2014-07-18: 10 mg/h via RESPIRATORY_TRACT
  Filled 2014-07-17: qty 20

## 2014-07-17 MED ORDER — NITROGLYCERIN 0.4 MG SL SUBL
0.4000 mg | SUBLINGUAL_TABLET | SUBLINGUAL | Status: DC | PRN
  Filled 2014-07-17: qty 1

## 2014-07-17 MED ORDER — MAGNESIUM SULFATE 2 GM/50ML IV SOLN
2.0000 g | Freq: Once | INTRAVENOUS | Status: AC
  Administered 2014-07-18: 2 g via INTRAVENOUS
  Filled 2014-07-17: qty 50

## 2014-07-17 NOTE — ED Provider Notes (Signed)
CSN: [id number]     Arrival date & time 07/17/14  2343 History   None    This chart was scribed for Tomasita Crumble, MD by Arlan Organ, ED Scribe. This patient was seen in room [facility] and the patient's care was started 11:44 PM.   No chief complaint on file.  The history is provided by the patient. No language interpreter was used.    LEVEL 5 CAVEAT DUE TO SEVERE RESPIRATORY DISTRESS  HPI Comments: Juan [name] brought in by EMS is a 64 y.o. male with a PMHx of CHF, hyperlipidemia, HTN, and COPD who presents to the Emergency Department here for constant, ongoing, progressively worsened respiratory distress onset this evening prior to arrival. Upon EMS arrival, shortness of breath significantly worsened along with noted associated wheezing. Two breathing treatments given, 5 albuterol, and CPAP machine initiated. Juan [name] denies any improvement but denies any worsening. He is not currently on any fluid pills. No known allergies to medications.  Past Medical History  Diagnosis Date  . CHF (congestive heart failure) 10/2010    ECHO:  EF 40%, Grade II diastolic dysfunction  . Alcoholism   . Allergy     seasonal   . Anxiety   . Depression   . Asthma   . Hyperlipidemia   . Hypertension   . Seizures   . COPD (chronic obstructive pulmonary disease)    Past Surgical History  Procedure Laterality Date  . Removal of ingrown toenail     Family History  Problem Relation Age of Onset  . Alzheimer's disease Mother   . Diabetes Mother   . Heart disease Maternal Uncle    History  Substance Use Topics  . Smoking status: Current Every Day Smoker -- 1.25 packs/day for 40 years    Types: Cigarettes  . Smokeless tobacco: Never Used     Comment: a little more than 1 ppd (02/17/14)  . Alcohol Use: 0.0 oz/week    0 Standard drinks or equivalent per week     Comment: 4-5 burbon's a day for the past 15 years - 02/17/13 (enough alcohol to maintain not having seizures)    Review of  Systems  Unable to perform ROS: Severe respiratory distress  All other systems reviewed and are negative.     Allergies  Review of patient's allergies indicates no known allergies.  Home Medications   Prior to Admission medications   Medication Sig Start Date End Date Taking? Authorizing Provider  acetaminophen (TYLENOL) 500 MG tablet Take 1,500 mg by mouth every 6 (six) hours as needed for headache (headache).     Historical Provider, MD  albuterol (PROVENTIL) (2.5 MG/3ML) 0.083% nebulizer solution Take 3 mLs (2.5 mg total) by nebulization every 6 (six) hours as needed for wheezing or shortness of breath. 07/12/14   Tobey Grim, MD  aspirin EC 81 MG EC tablet Take 1 tablet (81 mg total) by mouth daily. 11/13/12   Renae Fickle, MD  atorvastatin (LIPITOR) 40 MG tablet Take 1 tablet (40 mg total) by mouth daily at 6 PM. 03/17/14   Chrystie Nose, MD  B-Complex CAPS Take 1 capsule by mouth daily.    Historical Provider, MD  carvedilol (COREG) 25 MG tablet Take 1 tablet (25 mg total) by mouth 2 (two) times daily with a meal. 06/30/14   Ardith Dark, MD  clonazePAM (KLONOPIN) 0.5 MG tablet TAKE ONE TABLET BY MOUTH TWICE DAILY AS NEEDED FOR ANXIETY 07/06/14   Tobey Grim, MD  dicyclomine (BENTYL) 20 MG tablet Take 1 tablet (20 mg total) by mouth 3 (three) times daily before meals. Patient taking differently: Take 20 mg by mouth 3 (three) times daily with meals as needed for spasms.  04/17/14   Kathee Delton, MD  DULoxetine (CYMBALTA) 30 MG capsule Take 1 capsule (30 mg total) by mouth daily. 12/14/13   Tobey Grim, MD  fluticasone Aleda Grana) 50 MCG/ACT nasal spray Use two sprays in each nostril daily 11/08/13   Tobey Grim, MD  furosemide (LASIX) 40 MG tablet Take 1 tablet (40 mg total) by mouth daily. 03/17/14   Chrystie Nose, MD  gabapentin (NEURONTIN) 300 MG capsule TAKE ONE CAPSULE BY MOUTH THREE TIMES DAILY  Patient not taking: Reported on 06/24/2014 03/17/14   Tobey Grim, MD  Ipratropium-Albuterol (COMBIVENT RESPIMAT) 20-100 MCG/ACT AERS respimat INHALE TWO PUFFS BY MOUTH EVERY FOUR HOURS as needed for wheezing 07/12/14   Tobey Grim, MD  levofloxacin (LEVAQUIN) 500 MG tablet Take 1 tablet (500 mg total) by mouth daily. 06/30/14   Ardith Dark, MD  lisinopril (PRINIVIL,ZESTRIL) 5 MG tablet Take 1 tablet (5 mg total) by mouth daily. 06/30/14   Ardith Dark, MD  Magnesium 500 MG TABS Take 500 mg by mouth daily.    Historical Provider, MD  montelukast (SINGULAIR) 10 MG tablet Take 1 tablet (10 mg total) by mouth at bedtime. 10/26/13   Tobey Grim, MD  Multiple Vitamin (MULTIVITAMIN WITH MINERALS) TABS tablet Take 1 tablet by mouth daily.    Historical Provider, MD  potassium chloride SA (K-DUR,KLOR-CON) 20 MEQ tablet Take 2 tablets (40 mEq total) by mouth daily. 03/17/14   Chrystie Nose, MD  sodium chloride 1 G tablet Take 1 g by mouth 2 (two) times daily with a meal.    Historical Provider, MD  spironolactone (ALDACTONE) 25 MG tablet TAKE HALF TABLET BY MOUTH DAILY  03/17/14   Chrystie Nose, MD  thiamine 100 MG tablet Take 1 tablet (100 mg total) by mouth daily. 06/24/14   Ardith Dark, MD  tiotropium (SPIRIVA) 18 MCG inhalation capsule Place 1 capsule (18 mcg total) into inhaler and inhale daily. 03/16/14   Tobey Grim, MD  triamcinolone (KENALOG) 0.025 % ointment Apply 1 application topically 2 (two) times daily. Patient taking differently: Apply 1 application topically 2 (two) times daily as needed (for rash).  10/26/13   Tobey Grim, MD  Vitamin D, Ergocalciferol, (DRISDOL) 50000 UNITS CAPS capsule TAKE 1 CAPSULE BY MOUTH EVERY 7 DAYS. 06/06/14   Tobey Grim, MD   Triage Vitals: BP 192/132 mmHg  Pulse 110  Temp(Src) 96.5 F (35.8 C) (Axillary)  Resp 28  SpO2 99%   Physical Exam  Constitutional: He is oriented to person, place, and time. Vital signs are normal. He appears well-developed and well-nourished.  Non-toxic appearance.  He does not appear ill. He appears distressed.  HENT:  Head: Normocephalic and atraumatic.  Nose: Nose normal.  Mouth/Throat: Oropharynx is clear and moist. No oropharyngeal exudate.  Eyes: Conjunctivae and EOM are normal. Pupils are equal, round, and reactive to light. No scleral icterus.  Neck: Normal range of motion. Neck supple. No tracheal deviation, no edema, no erythema and normal range of motion present. No thyroid mass and no thyromegaly present.  Cardiovascular: Regular rhythm, S1 normal, S2 normal, normal heart sounds, intact distal pulses and normal pulses.  Tachycardia present.  Exam reveals no gallop and no friction rub.  No murmur heard. Pulses:      Radial pulses are 2+ on the right side, and 2+ on the left side.       Dorsalis pedis pulses are 2+ on the right side, and 2+ on the left side.  Pulmonary/Chest: Breath sounds normal. Accessory muscle usage present. Tachypnea noted. He is in respiratory distress. He has no wheezes. He has no rhonchi. He has no rales.  Wheezing everywhere   Abdominal: Soft. Normal appearance and bowel sounds are normal. He exhibits no distension, no ascites and no mass. There is no hepatosplenomegaly. There is no tenderness. There is no rebound, no guarding and no CVA tenderness.  Musculoskeletal: Normal range of motion. He exhibits edema. He exhibits no tenderness.  Lymphadenopathy:    He has no cervical adenopathy.  Neurological: He is alert and oriented to person, place, and time. He has normal strength. No cranial nerve deficit or sensory deficit.  Skin: Skin is warm, dry and intact. No petechiae and no rash noted. He is not diaphoretic. No erythema. No pallor.  Psychiatric: He has a normal mood and affect. His behavior is normal. Judgment normal.  Nursing note and vitals reviewed.   ED Course  Procedures (including critical care time)  DIAGNOSTIC STUDIES: Oxygen Saturation is 99% on CPAP, Normal by my interpretation.    COORDINATION OF  CARE: 11:44 PM-Discussed treatment plan with pt at bedside and pt agreed to plan.     Labs Review Labs Reviewed  CBC WITH DIFFERENTIAL/PLATELET - Abnormal; Notable for the following:    WBC 19.2 (*)    MCHC 36.6 (*)    Neutrophils Relative % 83 (*)    Neutro Abs 15.8 (*)    Lymphocytes Relative 8 (*)    Monocytes Absolute 1.4 (*)    All other components within normal limits  COMPREHENSIVE METABOLIC PANEL - Abnormal; Notable for the following:    Sodium 112 (*)    Chloride 77 (*)    Glucose, Bld 186 (*)    BUN <5 (*)    Calcium 8.6 (*)    ALT 14 (*)    Total Bilirubin 1.4 (*)    All other components within normal limits  BRAIN NATRIURETIC PEPTIDE - Abnormal; Notable for the following:    B Natriuretic Peptide 554.8 (*)    All other components within normal limits  COMPREHENSIVE METABOLIC PANEL - Abnormal; Notable for the following:    Sodium 113 (*)    Chloride 77 (*)    Glucose, Bld 224 (*)    BUN 5 (*)    Calcium 8.5 (*)    Albumin 3.4 (*)    All other components within normal limits  CBC - Abnormal; Notable for the following:    WBC 16.5 (*)    HCT 37.2 (*)    MCHC 36.6 (*)    All other components within normal limits  OSMOLALITY - Abnormal; Notable for the following:    Osmolality 239 (*)    All other components within normal limits  BASIC METABOLIC PANEL - Abnormal; Notable for the following:    Sodium 117 (*)    Potassium 5.4 (*)    Chloride 80 (*)    Glucose, Bld 135 (*)    All other components within normal limits  GLUCOSE, CAPILLARY - Abnormal; Notable for the following:    Glucose-Capillary 139 (*)    All other components within normal limits  I-STAT CHEM 8, ED - Abnormal; Notable for the following:    Sodium  111 (*)    Chloride 80 (*)    BUN 5 (*)    Creatinine, Ser 0.60 (*)    Glucose, Bld 198 (*)    Calcium, Ion 1.03 (*)    All other components within normal limits  I-STAT CHEM 8, ED - Abnormal; Notable for the following:    Sodium 112 (*)     Chloride 78 (*)    BUN 4 (*)    Creatinine, Ser 0.60 (*)    Glucose, Bld 196 (*)    All other components within normal limits  I-STAT ARTERIAL BLOOD GAS, ED - Abnormal; Notable for the following:    pH, Arterial 7.335 (*)    pO2, Arterial 121.0 (*)    All other components within normal limits  MRSA PCR SCREENING  CULTURE, EXPECTORATED SPUTUM-ASSESSMENT  CULTURE, BLOOD (ROUTINE X 2)  CULTURE, BLOOD (ROUTINE X 2)  PROTIME-INR  ETHANOL  BASIC METABOLIC PANEL  BASIC METABOLIC PANEL  I-STAT CG4 LACTIC ACID, ED  Rosezena Sensor, ED    Imaging Review Dg Chest Port 1 View  07/18/2014   CLINICAL DATA:  Acute onset of respiratory distress. Initial encounter.  EXAM: PORTABLE CHEST - 1 VIEW  COMPARISON:  Chest radiograph performed 06/26/2014  FINDINGS: The lungs are well-aerated. Minimal bilateral atelectasis is noted. There is no evidence of pleural effusion or pneumothorax.  The cardiomediastinal silhouette is within normal limits. No acute osseous abnormalities are seen.  IMPRESSION: Minimal bilateral atelectasis noted; lungs otherwise clear.   Electronically Signed   By: Roanna Raider M.D.   On: 07/18/2014 00:18     EKG Interpretation None      MDM   Final diagnoses:  None  Patient presents to the emergency department in severe respiratory distress. He does have a history of COPD and CHF. Bedside ultrasound does not show any B lines, this is likely COPD. He was given continuous albuterol treatment, magnesium in the emergency department. EMS already gave the patient steroids and albuterol en route.Marland Kitchen He was placed on BiPAP and states his breathing has improved.  Sodium is 111,  He has had hyponatremia in the past however this is significantly lower. Will repeat laboratory study.Repeat shows hyponatremia of 112, this is new for the patient.  He is in critical condition and will require admission for further care.  I spoke with family medicine resident who will admit to stepdown unit. CXR  neg for infection.  Emergency Ultrasound: Limited Thoracic Performed and interpreted by Dr Mora Bellman Longitudinal view of anterior left and right lung fields in real-time with linear probe. Indication: Respiratory distress Findings: positive lung sliding negative B lines Interpretation: no evidence of pneumothorax. Images electronically archived.       CRITICAL CARE Performed by: Tomasita Crumble   Total critical care time: . Respiratory distress  Critical care time was exclusive of separately billable procedures and treating other patients.  Critical care was necessary to treat or prevent imminent or life-threatening deterioration.  Critical care was time spent personally by me on the following activities: development of treatment plan with patient and/or surrogate as well as nursing, discussions with consultants, evaluation of patient's response to treatment, examination of patient, obtaining history from patient or surrogate, ordering and performing treatments and interventions, ordering and review of laboratory studies, ordering and review of radiographic studies, pulse oximetry and re-evaluation of patient's condition.    I personally performed the services described in this documentation, which was scribed in my presence. The recorded information has been reviewed and is  accurate.   Tomasita Crumble, MD 07/18/14 [phone]

## 2014-07-17 NOTE — ED Notes (Signed)
resp distress x3 days, accessory muscles in use. EMS placed CPAP, 98%. 197/125 initial pressure, pt continues to be hypertensive.

## 2014-07-18 ENCOUNTER — Emergency Department (HOSPITAL_COMMUNITY): Payer: No Typology Code available for payment source

## 2014-07-18 DIAGNOSIS — E785 Hyperlipidemia, unspecified: Secondary | ICD-10-CM | POA: Diagnosis present

## 2014-07-18 DIAGNOSIS — F419 Anxiety disorder, unspecified: Secondary | ICD-10-CM | POA: Diagnosis present

## 2014-07-18 DIAGNOSIS — E871 Hypo-osmolality and hyponatremia: Secondary | ICD-10-CM

## 2014-07-18 DIAGNOSIS — J80 Acute respiratory distress syndrome: Secondary | ICD-10-CM | POA: Diagnosis present

## 2014-07-18 DIAGNOSIS — F1010 Alcohol abuse, uncomplicated: Secondary | ICD-10-CM | POA: Diagnosis not present

## 2014-07-18 DIAGNOSIS — J441 Chronic obstructive pulmonary disease with (acute) exacerbation: Secondary | ICD-10-CM | POA: Diagnosis not present

## 2014-07-18 DIAGNOSIS — T380X5A Adverse effect of glucocorticoids and synthetic analogues, initial encounter: Secondary | ICD-10-CM | POA: Diagnosis present

## 2014-07-18 DIAGNOSIS — R000 Tachycardia, unspecified: Secondary | ICD-10-CM | POA: Diagnosis present

## 2014-07-18 DIAGNOSIS — J9600 Acute respiratory failure, unspecified whether with hypoxia or hypercapnia: Secondary | ICD-10-CM

## 2014-07-18 DIAGNOSIS — I5042 Chronic combined systolic (congestive) and diastolic (congestive) heart failure: Secondary | ICD-10-CM | POA: Diagnosis present

## 2014-07-18 DIAGNOSIS — I10 Essential (primary) hypertension: Secondary | ICD-10-CM | POA: Diagnosis present

## 2014-07-18 DIAGNOSIS — F17210 Nicotine dependence, cigarettes, uncomplicated: Secondary | ICD-10-CM | POA: Diagnosis present

## 2014-07-18 DIAGNOSIS — Z79899 Other long term (current) drug therapy: Secondary | ICD-10-CM | POA: Diagnosis not present

## 2014-07-18 DIAGNOSIS — R0602 Shortness of breath: Secondary | ICD-10-CM | POA: Diagnosis present

## 2014-07-18 DIAGNOSIS — Z7982 Long term (current) use of aspirin: Secondary | ICD-10-CM | POA: Diagnosis not present

## 2014-07-18 LAB — BASIC METABOLIC PANEL
ANION GAP: 13 (ref 5–15)
ANION GAP: 13 (ref 5–15)
Anion gap: 11 (ref 5–15)
BUN: 7 mg/dL (ref 6–20)
BUN: 11 mg/dL (ref 6–20)
BUN: 17 mg/dL (ref 6–20)
CALCIUM: 8.8 mg/dL — AB (ref 8.9–10.3)
CO2: 24 mmol/L (ref 22–32)
CO2: 25 mmol/L (ref 22–32)
CO2: 26 mmol/L (ref 22–32)
CREATININE: 0.8 mg/dL (ref 0.61–1.24)
Calcium: 9 mg/dL (ref 8.9–10.3)
Calcium: 8.7 mg/dL — ABNORMAL LOW (ref 8.9–10.3)
Chloride: 80 mmol/L — ABNORMAL LOW (ref 101–111)
Chloride: 81 mmol/L — ABNORMAL LOW (ref 101–111)
Chloride: 78 mmol/L — ABNORMAL LOW (ref 101–111)
Creatinine, Ser: 0.88 mg/dL (ref 0.61–1.24)
Creatinine, Ser: 0.99 mg/dL (ref 0.61–1.24)
GFR calc Af Amer: >60 mL/min (ref >60)
GFR calc Af Amer: >60 mL/min (ref >60)
GFR calc Af Amer: >60 mL/min (ref >60)
GFR calc non Af Amer: >60 mL/min (ref >60)
GLUCOSE: 236 mg/dL — AB (ref 65–99)
Glucose, Bld: 135 mg/dL — ABNORMAL HIGH (ref 65–99)
Glucose, Bld: 151 mg/dL — ABNORMAL HIGH (ref 65–99)
POTASSIUM: 5 mmol/L (ref 3.5–5.1)
Potassium: 5.4 mmol/L — ABNORMAL HIGH (ref 3.5–5.1)
Potassium: 5.2 mmol/L — ABNORMAL HIGH (ref 3.5–5.1)
SODIUM: 117 mmol/L — AB (ref 135–145)
SODIUM: 117 mmol/L — AB (ref 135–145)
Sodium: 117 mmol/L — CL (ref 135–145)

## 2014-07-18 LAB — I-STAT CHEM 8, ED
BUN: 4 mg/dL — AB (ref 6–20)
BUN: 5 mg/dL — ABNORMAL LOW (ref 6–20)
CALCIUM ION: 1.03 mmol/L — AB (ref 1.13–1.30)
CALCIUM ION: 1.13 mmol/L (ref 1.13–1.30)
CHLORIDE: 78 mmol/L — AB (ref 101–111)
CHLORIDE: 80 mmol/L — AB (ref 101–111)
CREATININE: 0.6 mg/dL — AB (ref 0.61–1.24)
Creatinine, Ser: 0.6 mg/dL — ABNORMAL LOW (ref 0.61–1.24)
GLUCOSE: 198 mg/dL — AB (ref 65–99)
Glucose, Bld: 196 mg/dL — ABNORMAL HIGH (ref 65–99)
HEMATOCRIT: 48 % (ref 39.0–52.0)
HEMATOCRIT: 48 % (ref 39.0–52.0)
Hemoglobin: 16.3 g/dL (ref 13.0–17.0)
Hemoglobin: 16.3 g/dL (ref 13.0–17.0)
Potassium: 4.2 mmol/L (ref 3.5–5.1)
Potassium: 4.7 mmol/L (ref 3.5–5.1)
Sodium: 111 mmol/L — CL (ref 135–145)
Sodium: 112 mmol/L — CL (ref 135–145)
TCO2: 22 mmol/L (ref 0–100)
TCO2: 23 mmol/L (ref 0–100)

## 2014-07-18 LAB — CBC
HEMATOCRIT: 37.2 % — AB (ref 39.0–52.0)
Hemoglobin: 13.6 g/dL (ref 13.0–17.0)
MCH: 31.3 pg (ref 26.0–34.0)
MCHC: 36.6 g/dL — ABNORMAL HIGH (ref 30.0–36.0)
MCV: 85.7 fL (ref 78.0–100.0)
Platelets: 286 10*3/uL (ref 150–400)
RBC: 4.34 MIL/uL (ref 4.22–5.81)
RDW: 13.4 % (ref 11.5–15.5)
WBC: 16.5 10*3/uL — AB (ref 4.0–10.5)

## 2014-07-18 LAB — CBC WITH DIFFERENTIAL/PLATELET
BASOS PCT: 0 % (ref 0–1)
Basophils Absolute: 0 10*3/uL (ref 0.0–0.1)
Eosinophils Absolute: 0.4 10*3/uL (ref 0.0–0.7)
Eosinophils Relative: 2 % (ref 0–5)
HEMATOCRIT: 39.3 % (ref 39.0–52.0)
Hemoglobin: 14.4 g/dL (ref 13.0–17.0)
LYMPHS PCT: 8 % — AB (ref 12–46)
Lymphs Abs: 1.6 10*3/uL (ref 0.7–4.0)
MCH: 31.4 pg (ref 26.0–34.0)
MCHC: 36.6 g/dL — ABNORMAL HIGH (ref 30.0–36.0)
MCV: 85.8 fL (ref 78.0–100.0)
MONO ABS: 1.4 10*3/uL — AB (ref 0.1–1.0)
Monocytes Relative: 7 % (ref 3–12)
NEUTROS ABS: 15.8 10*3/uL — AB (ref 1.7–7.7)
Neutrophils Relative %: 83 % — ABNORMAL HIGH (ref 43–77)
Platelets: 320 10*3/uL (ref 150–400)
RBC: 4.58 MIL/uL (ref 4.22–5.81)
RDW: 13.4 % (ref 11.5–15.5)
WBC: 19.2 10*3/uL — ABNORMAL HIGH (ref 4.0–10.5)

## 2014-07-18 LAB — COMPREHENSIVE METABOLIC PANEL
ALT: 14 U/L — AB (ref 17–63)
ALT: 17 U/L (ref 17–63)
AST: 37 U/L (ref 15–41)
AST: 19 U/L (ref 15–41)
Albumin: 3.7 g/dL (ref 3.5–5.0)
Albumin: 3.4 g/dL — ABNORMAL LOW (ref 3.5–5.0)
Alkaline Phosphatase: 86 U/L (ref 38–126)
Alkaline Phosphatase: 88 U/L (ref 38–126)
Anion gap: 13 (ref 5–15)
Anion gap: 14 (ref 5–15)
BILIRUBIN TOTAL: 1.4 mg/dL — AB (ref 0.3–1.2)
BUN: <5 mg/dL — ABNORMAL LOW (ref 6–20)
BUN: 5 mg/dL — ABNORMAL LOW (ref 6–20)
CHLORIDE: 77 mmol/L — AB (ref 101–111)
CHLORIDE: 77 mmol/L — AB (ref 101–111)
CO2: 22 mmol/L (ref 22–32)
CO2: 22 mmol/L (ref 22–32)
CREATININE: 0.71 mg/dL (ref 0.61–1.24)
Calcium: 8.6 mg/dL — ABNORMAL LOW (ref 8.9–10.3)
Calcium: 8.5 mg/dL — ABNORMAL LOW (ref 8.9–10.3)
Creatinine, Ser: 0.79 mg/dL (ref 0.61–1.24)
GFR calc Af Amer: >60 mL/min (ref >60)
GFR calc non Af Amer: >60 mL/min (ref >60)
Glucose, Bld: 186 mg/dL — ABNORMAL HIGH (ref 65–99)
Glucose, Bld: 224 mg/dL — ABNORMAL HIGH (ref 65–99)
POTASSIUM: 4.8 mmol/L (ref 3.5–5.1)
Potassium: 3.9 mmol/L (ref 3.5–5.1)
SODIUM: 112 mmol/L — AB (ref 135–145)
SODIUM: 113 mmol/L — AB (ref 135–145)
Total Bilirubin: 0.4 mg/dL (ref 0.3–1.2)
Total Protein: 6.6 g/dL (ref 6.5–8.1)
Total Protein: 7.3 g/dL (ref 6.5–8.1)

## 2014-07-18 LAB — I-STAT ARTERIAL BLOOD GAS, ED
Acid-base deficit: 2 mmol/L (ref 0.0–2.0)
Bicarbonate: 23.9 mEq/L (ref 20.0–24.0)
O2 Saturation: 98 %
TCO2: 25 mmol/L (ref 0–100)
pCO2 arterial: 44.8 mmHg (ref 35.0–45.0)
pH, Arterial: 7.335 — ABNORMAL LOW (ref 7.350–7.450)
pO2, Arterial: 121 mmHg — ABNORMAL HIGH (ref 80.0–100.0)

## 2014-07-18 LAB — GLUCOSE, CAPILLARY
GLUCOSE-CAPILLARY: 223 mg/dL — AB (ref 65–99)
Glucose-Capillary: 139 mg/dL — ABNORMAL HIGH (ref 65–99)
Glucose-Capillary: 181 mg/dL — ABNORMAL HIGH (ref 65–99)

## 2014-07-18 LAB — I-STAT TROPONIN, ED: TROPONIN I, POC: 0.03 ng/mL (ref 0.00–0.08)

## 2014-07-18 LAB — BRAIN NATRIURETIC PEPTIDE: B Natriuretic Peptide: 554.8 pg/mL — ABNORMAL HIGH (ref 0.0–100.0)

## 2014-07-18 LAB — ETHANOL: Alcohol, Ethyl (B): <5 mg/dL (ref <5)

## 2014-07-18 LAB — OSMOLALITY: Osmolality: 239 mOsm/kg — ABNORMAL LOW (ref 275–300)

## 2014-07-18 LAB — MRSA PCR SCREENING: MRSA by PCR: NEGATIVE

## 2014-07-18 LAB — PROTIME-INR
INR: 0.96 (ref 0.00–1.49)
Prothrombin Time: 12.9 seconds (ref 11.6–15.2)

## 2014-07-18 LAB — I-STAT CG4 LACTIC ACID, ED: Lactic Acid, Venous: 1.01 mmol/L (ref 0.5–2.0)

## 2014-07-18 MED ORDER — ATORVASTATIN CALCIUM 40 MG PO TABS
40.0000 mg | ORAL_TABLET | Freq: Every day | ORAL | Status: DC
  Administered 2014-07-18 – 2014-07-19 (×2): 40 mg via ORAL
  Filled 2014-07-18 (×3): qty 1

## 2014-07-18 MED ORDER — THIAMINE HCL 100 MG/ML IJ SOLN
100.0000 mg | Freq: Every day | INTRAMUSCULAR | Status: DC
  Administered 2014-07-18: 100 mg via INTRAVENOUS
  Filled 2014-07-18 (×2): qty 1

## 2014-07-18 MED ORDER — CLONAZEPAM 0.5 MG PO TABS
0.5000 mg | ORAL_TABLET | Freq: Three times a day (TID) | ORAL | Status: DC | PRN
  Administered 2014-07-18: 0.5 mg via ORAL
  Filled 2014-07-18: qty 1

## 2014-07-18 MED ORDER — FUROSEMIDE 10 MG/ML IJ SOLN
40.0000 mg | Freq: Two times a day (BID) | INTRAMUSCULAR | Status: DC
  Administered 2014-07-18 (×2): 40 mg via INTRAVENOUS
  Filled 2014-07-18 (×4): qty 4

## 2014-07-18 MED ORDER — CLONAZEPAM 0.5 MG PO TABS
0.5000 mg | ORAL_TABLET | Freq: Two times a day (BID) | ORAL | Status: DC
  Administered 2014-07-18 – 2014-07-20 (×4): 0.5 mg via ORAL
  Filled 2014-07-18 (×4): qty 1

## 2014-07-18 MED ORDER — LORAZEPAM 0.5 MG PO TABS
0.5000 mg | ORAL_TABLET | Freq: Four times a day (QID) | ORAL | Status: DC | PRN
Start: 2014-07-18 — End: 2014-07-20

## 2014-07-18 MED ORDER — IPRATROPIUM-ALBUTEROL 0.5-2.5 (3) MG/3ML IN SOLN
3.0000 mL | Freq: Four times a day (QID) | RESPIRATORY_TRACT | Status: DC
  Administered 2014-07-18: 3 mL via RESPIRATORY_TRACT
  Filled 2014-07-18: qty 3

## 2014-07-18 MED ORDER — ADULT MULTIVITAMIN W/MINERALS CH
1.0000 | ORAL_TABLET | Freq: Every day | ORAL | Status: DC
  Administered 2014-07-19 – 2014-07-20 (×2): 1 via ORAL
  Filled 2014-07-18 (×2): qty 1

## 2014-07-18 MED ORDER — MONTELUKAST SODIUM 10 MG PO TABS
10.0000 mg | ORAL_TABLET | Freq: Every day | ORAL | Status: DC
  Administered 2014-07-18 – 2014-07-19 (×2): 10 mg via ORAL
  Filled 2014-07-18 (×3): qty 1

## 2014-07-18 MED ORDER — ACETAMINOPHEN 500 MG PO TABS
500.0000 mg | ORAL_TABLET | Freq: Four times a day (QID) | ORAL | Status: DC | PRN
  Administered 2014-07-18 – 2014-07-20 (×3): 500 mg via ORAL
  Filled 2014-07-18 (×3): qty 1

## 2014-07-18 MED ORDER — METHYLPREDNISOLONE SODIUM SUCC 40 MG IJ SOLR
40.0000 mg | Freq: Every day | INTRAMUSCULAR | Status: DC
  Filled 2014-07-18: qty 1

## 2014-07-18 MED ORDER — SODIUM CHLORIDE 1 G PO TABS
1.0000 g | ORAL_TABLET | Freq: Two times a day (BID) | ORAL | Status: DC
  Administered 2014-07-18 – 2014-07-20 (×5): 1 g via ORAL
  Filled 2014-07-18 (×7): qty 1

## 2014-07-18 MED ORDER — ALBUTEROL SULFATE (2.5 MG/3ML) 0.083% IN NEBU
2.5000 mg | INHALATION_SOLUTION | RESPIRATORY_TRACT | Status: DC | PRN
Start: 2014-07-18 — End: 2014-07-20
  Administered 2014-07-18 (×2): 2.5 mg via RESPIRATORY_TRACT
  Filled 2014-07-18 (×2): qty 3

## 2014-07-18 MED ORDER — PREDNISONE 20 MG PO TABS
40.0000 mg | ORAL_TABLET | Freq: Every day | ORAL | Status: DC
  Administered 2014-07-18 – 2014-07-20 (×3): 40 mg via ORAL
  Filled 2014-07-18 (×4): qty 2

## 2014-07-18 MED ORDER — LEVOFLOXACIN 500 MG PO TABS
500.0000 mg | ORAL_TABLET | Freq: Every day | ORAL | Status: DC
  Administered 2014-07-19 – 2014-07-20 (×2): 500 mg via ORAL
  Filled 2014-07-18 (×2): qty 1

## 2014-07-18 MED ORDER — DULOXETINE HCL 30 MG PO CPEP
30.0000 mg | ORAL_CAPSULE | Freq: Every day | ORAL | Status: DC
  Administered 2014-07-18 – 2014-07-20 (×3): 30 mg via ORAL
  Filled 2014-07-18 (×3): qty 1

## 2014-07-18 MED ORDER — CETYLPYRIDINIUM CHLORIDE 0.05 % MT LIQD
7.0000 mL | Freq: Two times a day (BID) | OROMUCOSAL | Status: DC
  Administered 2014-07-18 – 2014-07-20 (×5): 7 mL via OROMUCOSAL

## 2014-07-18 MED ORDER — CHLORHEXIDINE GLUCONATE 0.12 % MT SOLN
15.0000 mL | Freq: Two times a day (BID) | OROMUCOSAL | Status: DC
  Administered 2014-07-18 – 2014-07-20 (×5): 15 mL via OROMUCOSAL
  Filled 2014-07-18 (×7): qty 15

## 2014-07-18 MED ORDER — ASPIRIN EC 81 MG PO TBEC
81.0000 mg | DELAYED_RELEASE_TABLET | Freq: Every day | ORAL | Status: DC
  Administered 2014-07-18 – 2014-07-20 (×3): 81 mg via ORAL
  Filled 2014-07-18 (×3): qty 1

## 2014-07-18 MED ORDER — IPRATROPIUM-ALBUTEROL 0.5-2.5 (3) MG/3ML IN SOLN
3.0000 mL | Freq: Four times a day (QID) | RESPIRATORY_TRACT | Status: DC
  Administered 2014-07-18 – 2014-07-20 (×7): 3 mL via RESPIRATORY_TRACT
  Filled 2014-07-18 (×7): qty 3

## 2014-07-18 MED ORDER — ENALAPRILAT 1.25 MG/ML IV SOLN
1.2500 mg | Freq: Once | INTRAVENOUS | Status: AC
  Administered 2014-07-18: 1.25 mg via INTRAVENOUS
  Filled 2014-07-18: qty 1

## 2014-07-18 MED ORDER — PREDNISONE 20 MG PO TABS: 40.0000 mg | ORAL_TABLET | Freq: Every day | ORAL | Status: DC

## 2014-07-18 MED ORDER — LEVOFLOXACIN IN D5W 500 MG/100ML IV SOLN
500.0000 mg | INTRAVENOUS | Status: DC
  Administered 2014-07-18: 500 mg via INTRAVENOUS
  Filled 2014-07-18: qty 100

## 2014-07-18 MED ORDER — IPRATROPIUM-ALBUTEROL 0.5-2.5 (3) MG/3ML IN SOLN
3.0000 mL | RESPIRATORY_TRACT | Status: DC
  Administered 2014-07-18 (×2): 3 mL via RESPIRATORY_TRACT
  Filled 2014-07-18 (×2): qty 3

## 2014-07-18 MED ORDER — LORAZEPAM 2 MG/ML IJ SOLN: 0.5000 mg | Freq: Four times a day (QID) | INTRAMUSCULAR | Status: DC | PRN

## 2014-07-18 MED ORDER — FUROSEMIDE 40 MG PO TABS
40.0000 mg | ORAL_TABLET | Freq: Two times a day (BID) | ORAL | Status: DC
  Administered 2014-07-19: 40 mg via ORAL
  Filled 2014-07-18 (×3): qty 1

## 2014-07-18 MED ORDER — FOLIC ACID 5 MG/ML IJ SOLN
1.0000 mg | Freq: Every day | INTRAMUSCULAR | Status: DC
  Administered 2014-07-18: 1 mg via INTRAVENOUS
  Filled 2014-07-18 (×2): qty 0.2

## 2014-07-18 MED ORDER — CLONAZEPAM 0.5 MG PO TABS
0.5000 mg | ORAL_TABLET | Freq: Two times a day (BID) | ORAL | Status: DC | PRN
  Administered 2014-07-18: 0.5 mg via ORAL
  Filled 2014-07-18: qty 1

## 2014-07-18 MED ORDER — HEPARIN SODIUM (PORCINE) 5000 UNIT/ML IJ SOLN
5000.0000 [IU] | Freq: Three times a day (TID) | INTRAMUSCULAR | Status: DC
  Administered 2014-07-18 – 2014-07-20 (×8): 5000 [IU] via SUBCUTANEOUS
  Filled 2014-07-18 (×10): qty 1

## 2014-07-18 MED ORDER — INSULIN ASPART 100 UNIT/ML ~~LOC~~ SOLN
0.0000 [IU] | Freq: Three times a day (TID) | SUBCUTANEOUS | Status: DC
  Administered 2014-07-18: 1 [IU] via SUBCUTANEOUS
  Administered 2014-07-18: 2 [IU] via SUBCUTANEOUS
  Administered 2014-07-19: 5 [IU] via SUBCUTANEOUS
  Administered 2014-07-19 – 2014-07-20 (×3): 1 [IU] via SUBCUTANEOUS

## 2014-07-18 MED ORDER — LEVOFLOXACIN IN D5W 500 MG/100ML IV SOLN: 500.0000 mg | INTRAVENOUS | Status: DC

## 2014-07-18 MED ORDER — SODIUM CHLORIDE 1 G PO TABS: 1.0000 g | ORAL_TABLET | Freq: Two times a day (BID) | ORAL | Status: DC

## 2014-07-18 NOTE — H&P (Signed)
Family Medicine Teaching [facility] Admission History and Physical Service Pager: [phone]  Patient name: [name] Medical record number: [id number] Date of birth: May 25, 1950 Age: 64 y.o. Gender: male  Primary Care Provider: Renold Don, MD Consultants: None Code Status: Full code  Chief Complaint: Respiratory distress  Assessment and Plan: Juan [name] is a 64 y.o. male presenting with respiratory distress. PMH is significant for Bronchiectasis vs COPD, HLD, HTN, EtOH abuse.  #Acute respiratory distress: patient with underlying lung disease, bronchiectasis, in addition to cardiac disease (CHF, although last EF of 55-60% without mention of diastolic dysfunction). Lungs sound very wheezy and are with minimal air movement. BNP elevated to ~500 and weight appears to be around dry weight (184lb from char review). PE a possibility with tachycardia, although this is post-albuterol. Well score 1.5 for tachycardia (which can be explained by albuterol as mentioned). Delirium tremens a possibility, however, apart from tachycardia, patient not exhibiting signs of withdrawal. ABG reassuring. Last drink about one month ago. Currently with labored breathing. Mentation is normal. Per patient, albuterol/atrovent helped in ED. Also received solu-medrol which may have helped - admit to inpatient, stepdown, admitting physician Dr. Deirdre Priest - BiPap PRN to keep O2 sats >90% - Repeat CXR in AM if not improving - Duonebs q6hrs - Albuterol q4hrs PRN - holding outpatient oral medications while on bipap. Will need to reconcile medications if does not come off in AM - continue steroids - Levofloxacin 500mg  IV qD - consider pulmonology consult in AM as PCP was trying to set this up  #Leukocytosis: with tachypnea, tachycardia and slight hypothermia (will repeat), slight concern for sepsis picture, however, tachypnea explained by exacerbation, tachycardia secondary to albuterol nebulizer treatments  and hypothermia possibly related to underlying cirrhosis. Leukocytosis most likely secondary to steroids - CBC in AM - Blood culture since starting patient on Levaquin for above problem - will watch patient closely. If develops low grade fevers, will likely start empiric abx  #Hyponatremia, severe: most likely chronic. Sodium of 112 on admission. Baseline appears to be in mid 120s to 130. Patient on salt tablets at home but has not been taking them regularly. Does nto appear to be symptomatic, however, currently with severe hyponatremia. - lasix 40mg  BID, but may need to adjust if patient remains NPO 2/2 to bipap - watch Bmet closely. Follow-up chemistry at 500 this AM; aiming for ~70meq rise per 24 hour period - neuro checks - saline lock IV  #History of alcohol abuse: states last drink was over one month ago. This is confirmed via PCP notes. - continue outpatient management - EtOH - folate - B1 - holding Klonopin as it is PRN  #HTN: s/p enalapril IV in ED for blood pressures in 190s/130s recorded in MAR - holding oral BP medications overnight - follow-up BPs  FEN/GI: SLIV, NPO while on bipap Prophylaxis: heparin subq  Disposition: Stepdown unit  History of Present Illness: Juan [name] is a 64 y.o. male presenting with respiratory distress. Patient's wife states that patient was seen at [facility] office on 6/7 for worsening shortness of breath and given a treatment plan, including augmenting nebulizer treatment and starting antibiotic coverage. Over the weekend, patient's dyspnea continually worsened to a point where patient was having dyspnea at rest. He told his wife that she needed to call EMS and that he could not walk to the car. When EMS arrived, he required a stretcher. No fevers, nausea, vomiting. No chest pain. Per ED report, patient received albuterol, CPAP and 125mg   of Solu-Medrol while en route via EMS. Upon arrival to the ED, patient received CAT and atrovent in addition to  enalapril IV for elevated blood pressure.  Review Of Systems: Per HPI with the following additions: None Otherwise 12 point review of systems was performed and was unremarkable.  Patient Active Problem List   Diagnosis Date Noted  . Metabolic acidosis   . Fall   . Alcohol withdrawal   . COPD exacerbation   . Essential hypertension   . HLD (hyperlipidemia)   . COLD (chronic obstructive lung disease)   . Dehydration 06/23/2014  . Alcohol abuse   . Weakness   . Cough   . Bronchiectasis with acute exacerbation 03/04/2014  . Tachycardia 02/12/2014  . Hypokalemia 01/26/2014  . Chronic diastolic heart failure   . Peripheral neuropathy 12/15/2013  . ETOH abuse 11/18/2013  . Bronchiectasis without acute exacerbation 06/30/2013  . Lung nodule seen on imaging study 06/15/2013  . Chronic systolic heart failure 06/13/2013  . Transaminitis 06/13/2013  . RBBB 11/09/2012  . Hyponatremia 11/08/2012  . Umbilical hernia 08/06/2012  . Smoker unmotivated to quit 07/22/2012  . Tremor 07/30/2011  . HTN (hypertension) 07/30/2011  . Hepatomegaly 11/05/2010  . Depression 11/05/2010  . Anxiety 11/05/2010   Past Medical History: Past Medical History  Diagnosis Date  . CHF (congestive heart failure) 10/2010    ECHO:  EF 40%, Grade II diastolic dysfunction  . Alcoholism   . Allergy     seasonal   . Anxiety   . Depression   . Asthma   . Hyperlipidemia   . Hypertension   . Seizures   . COPD (chronic obstructive pulmonary disease)    Past Surgical History: Past Surgical History  Procedure Laterality Date  . Removal of ingrown toenail     Social History: History  Substance Use Topics  . Smoking status: Current Every Day Smoker -- 1.25 packs/day for 40 years    Types: Cigarettes  . Smokeless tobacco: Never Used     Comment: a little more than 1 ppd (02/17/14)  . Alcohol Use: 0.0 oz/week    0 Standard drinks or equivalent per week     Comment: 4-5 burbon's a day for the past 15 years -  02/17/13 (enough alcohol to maintain not having seizures)   Additional social history: None  Please also refer to relevant sections of EMR.  Family History: Family History  Problem Relation Age of Onset  . Alzheimer's disease Mother   . Diabetes Mother   . Heart disease Maternal Uncle    Allergies and Medications: No Known Allergies No current facility-administered medications on file prior to encounter.   Current Outpatient Prescriptions on File Prior to Encounter  Medication Sig Dispense Refill  . acetaminophen (TYLENOL) 500 MG tablet Take 1,500 mg by mouth every 6 (six) hours as needed for headache (headache).     Marland Kitchen albuterol (PROVENTIL) (2.5 MG/3ML) 0.083% nebulizer solution Take 3 mLs (2.5 mg total) by nebulization every 6 (six) hours as needed for wheezing or shortness of breath. 150 mL 1  . aspirin EC 81 MG EC tablet Take 1 tablet (81 mg total) by mouth daily. 90 tablet 0  . atorvastatin (LIPITOR) 40 MG tablet Take 1 tablet (40 mg total) by mouth daily at 6 PM. 30 tablet 6  . B-Complex CAPS Take 1 capsule by mouth daily.    . carvedilol (COREG) 25 MG tablet Take 1 tablet (25 mg total) by mouth 2 (two) times daily with a  meal. 60 tablet 0  . clonazePAM (KLONOPIN) 0.5 MG tablet TAKE ONE TABLET BY MOUTH TWICE DAILY AS NEEDED FOR ANXIETY 60 tablet 1  . dicyclomine (BENTYL) 20 MG tablet Take 1 tablet (20 mg total) by mouth 3 (three) times daily before meals. (Patient taking differently: Take 20 mg by mouth 3 (three) times daily with meals as needed for spasms. ) 90 tablet 0  . DULoxetine (CYMBALTA) 30 MG capsule Take 1 capsule (30 mg total) by mouth daily. 30 capsule 3  . fluticasone (FLONASE) 50 MCG/ACT nasal spray Use two sprays in each nostril daily 16 g 11  . furosemide (LASIX) 40 MG tablet Take 1 tablet (40 mg total) by mouth daily. 30 tablet 6  . gabapentin (NEURONTIN) 300 MG capsule TAKE ONE CAPSULE BY MOUTH THREE TIMES DAILY  (Patient not taking: Reported on 06/24/2014) 90  capsule 6  . Ipratropium-Albuterol (COMBIVENT RESPIMAT) 20-100 MCG/ACT AERS respimat INHALE TWO PUFFS BY MOUTH EVERY FOUR HOURS as needed for wheezing 4 g 3  . levofloxacin (LEVAQUIN) 500 MG tablet Take 1 tablet (500 mg total) by mouth daily. 2 tablet 0  . lisinopril (PRINIVIL,ZESTRIL) 5 MG tablet Take 1 tablet (5 mg total) by mouth daily. 30 tablet 0  . Magnesium 500 MG TABS Take 500 mg by mouth daily.    . montelukast (SINGULAIR) 10 MG tablet Take 1 tablet (10 mg total) by mouth at bedtime. 30 tablet 3  . Multiple Vitamin (MULTIVITAMIN WITH MINERALS) TABS tablet Take 1 tablet by mouth daily.    . potassium chloride SA (K-DUR,KLOR-CON) 20 MEQ tablet Take 2 tablets (40 mEq total) by mouth daily. 60 tablet 6  . sodium chloride 1 G tablet Take 1 g by mouth 2 (two) times daily with a meal.    . spironolactone (ALDACTONE) 25 MG tablet TAKE HALF TABLET BY MOUTH DAILY  15 tablet 5  . thiamine 100 MG tablet Take 1 tablet (100 mg total) by mouth daily. 30 tablet 0  . tiotropium (SPIRIVA) 18 MCG inhalation capsule Place 1 capsule (18 mcg total) into inhaler and inhale daily. 30 capsule 6  . triamcinolone (KENALOG) 0.025 % ointment Apply 1 application topically 2 (two) times daily. (Patient taking differently: Apply 1 application topically 2 (two) times daily as needed (for rash). ) 30 g 0  . Vitamin D, Ergocalciferol, (DRISDOL) 50000 UNITS CAPS capsule TAKE 1 CAPSULE BY MOUTH EVERY 7 DAYS. 6 capsule 0  . [DISCONTINUED] albuterol (PROVENTIL,VENTOLIN) 90 MCG/ACT inhaler Inhale 2 puffs into the lungs every 6 (six) hours as needed for wheezing. 17 g 0    Objective: BP 137/83 mmHg  Pulse 112  Temp(Src) 96.5 F (35.8 C) (Axillary)  Resp 22  SpO2 99% Exam: General: Fair appearing, mildly distress Eyes: Right pupil slightly larger than left. Both reactive to light but right appears more reactive. EOMI intact ENTM: Bipap on Neck: Appears normal Cardiovascular: Tachycardia, could not appreciate murmur,  however lung sounds obscured heart sounds Respiratory: Diffuse expiratory wheeze with prolonged expiratory phase. Labored breathing with usage of accessory muscles Abdomen: Soft, non-tender, distended, large ventral hernia present that is non-tender MSK: No leg edema or tenderness Skin: No cyanosis Neuro: Alert, oriented x3  Labs and Imaging: CBC BMET   Recent Labs Lab 07/18/14 0016 07/18/14 0041  WBC 19.2*  --   HGB 14.4  16.3 16.3  HCT 39.3  48.0 48.0  PLT 320  --     Recent Labs Lab 07/18/14 0016 07/18/14 0041  NA 112*  111* 112*  K 4.8  4.7 4.2  CL 77*  80* 78*  CO2 22  --   BUN <5*  5* 4*  CREATININE 0.71  0.60* 0.60*  GLUCOSE 186*  198* 196*  CALCIUM 8.6*  --      Dg Chest Port 1 View  07/18/2014   CLINICAL DATA:  Acute onset of respiratory distress. Initial encounter.  EXAM: PORTABLE CHEST - 1 VIEW  COMPARISON:  Chest radiograph performed 06/26/2014  FINDINGS: The lungs are well-aerated. Minimal bilateral atelectasis is noted. There is no evidence of pleural effusion or pneumothorax.  The cardiomediastinal silhouette is within normal limits. No acute osseous abnormalities are seen.  IMPRESSION: Minimal bilateral atelectasis noted; lungs otherwise clear.   Electronically Signed   By: Roanna Raider M.D.   On: 07/18/2014 00:18    Narda Bonds, MD 07/18/2014, 2:07 AM PGY-2, [place] [facility] FPTS Intern pager: [phone], text pages welcome

## 2014-07-18 NOTE — Progress Notes (Signed)
Family Medicine Teaching Service Daily Progress Note Intern Pager: [phone]  Patient name: Juan [name] Medical record number: [id number] Date of birth: 30-Jul-1950 Age: 64 y.o. Gender: male  Primary Care Provider: Renold Don, MD Consultants: none Code Status: Full  Pt Overview and Major Events to Date:  06/13: Admitted  Assessment and Plan: Juan [name] is a 64 y.o. male presenting with respiratory distress. PMH is significant for Bronchiectasis vs COPD, HLD, HTN, EtOH abuse.   #Acute respiratory distress: patient with underlying lung disease, bronchiectasis, in addition to cardiac disease (CHF, although last EF of 55-60% without mention of diastolic dysfunction). Lungs sound very wheezy and are with minimal air movement. BNP elevated to ~500 and weight appears to be around dry weight (184lb from char review). PE a possibility with tachycardia, although this is post-albuterol. Well score 1.5 for tachycardia (which can be explained by albuterol as mentioned). Delirium tremens a possibility, however, apart from tachycardia, patient not exhibiting signs of withdrawal. ABG reassuring. Last drink about one month ago. Currently with labored breathing. Mentation is normal. Per patient, albuterol/atrovent helped in ED. Also received solu-medrol which may have helped.  - BiPap PRN to keep O2 sats >90%, currently on 3L [place] - Duonebs q6hrs - Albuterol q4hrs PRN (none overnight) - Resume home oral meds this am - continue steroids (06/13>) - Levofloxacin  IV qD (06/13>); transition to PO 6/14 - consider pulmonology consult, as PCP was trying to set this up  #Leukocytosis: with tachypnea, tachycardia and slight hypothermia (will repeat), slight concern for sepsis picture, however, tachypnea explained by exacerbation, tachycardia secondary to albuterol nebulizer treatments and hypothermia possibly related to underlying cirrhosis. Leukocytosis most likely secondary to steroids. Improving this  am 19.2>16.2 - Blood culture since starting patient on Levaquin for above problem - will watch patient closely. If develops low grade fevers, will likely start empiric abx  #Hyponatremia, severe: 113 this am.  most likely chronic. Sodium of 112 on admission. Baseline appears to be in mid 120s to 130. Patient on salt tablets at home but has not been taking them regularly. Does not appear to be symptomatic, however, currently with severe hyponatremia. - lasix  BID, but may need to adjust if patient remains NPO 2/2 to bipap - watch Bmet closely; aiming for ~72meq rise per 24 hour period - neuro checks - saline lock IV  #History of alcohol abuse: states last drink was over one month ago. This is confirmed via PCP notes. - continue outpatient management - EtOH - folate - B1 - Resume Klonopin; Increased this to TID PRN   #HTN: Normotensive. s/p enalapril IV in ED for blood pressures in 190s/130s recorded in MAR - Hold BP medications for now - follow-up BPs  FEN/GI: SLIV, NPO while on bipap Prophylaxis: heparin subq  Disposition: Discharge pending improvement in respiratory symptoms  Subjective:  Patient reports that he is doing better this morning.  Breathing has improved.  Continues to be anxious.  Objective: Temp:  [96.5 F (35.8 C)-97.5 F (36.4 C)] 97.5 F (36.4 C) (06/13 0315) Pulse Rate:  [106-114] 107 (06/13 0430) Resp:  [16-37] 17 (06/13 0430) BP: (80-192)/(57-132) 90/61 mmHg (06/13 0430) SpO2:  [96 %-100 %] 99 % (06/13 0430) FiO2 (%):  [40 %] 40 % (06/13 0315) Weight:  [185 lb 6.5 oz (84.1 kg)] 185 lb 6.5 oz (84.1 kg) (06/13 0315) Physical Exam: General: awake, alert, NAD, wife at bedside Cardiovascular: tachycardic, regular rhythm, no murmurs Respiratory: global expiratory wheeze, 3L [place] in place saturation 94%  Abdomen: obese, soft, NT/ND, large nontender umbilical hernia Extremities: +1 DP, no edema, moves extremities independently  Laboratory:  Recent  Labs Lab 07/18/14 0016 07/18/14 0041 07/18/14 0407  WBC 19.2*  --  16.5*  HGB 14.4  16.3 16.3 13.6  HCT 39.3  48.0 48.0 37.2*  PLT 320  --  286    Recent Labs Lab 07/18/14 0016 07/18/14 0041 07/18/14 0407  NA 112*  111* 112* 113*  K 4.8  4.7 4.2 3.9  CL 77*  80* 78* 77*  CO2 22  --  22  BUN <5*  5* 4* 5*  CREATININE 0.71  0.60* 0.60* 0.79  CALCIUM 8.6*  --  8.5*  PROT 6.6  --  7.3  BILITOT 1.4*  --  0.4  ALKPHOS 86  --  88  ALT 14*  --  17  AST 37  --  19  GLUCOSE 186*  198* 196* 224*    Imaging/Diagnostic Tests:   Raliegh Ip, DO 07/18/2014, 6:48 AM PGY-1, [place] [facility] FPTS Intern pager: [phone], text pages welcome

## 2014-07-18 NOTE — ED Notes (Signed)
Dr Oni given a copy of chem 8 results  

## 2014-07-18 NOTE — Progress Notes (Signed)
Interval progress note  S: Pt asleep. Wife states breathing at baseline. Also states he has not had a drink since last hospitalization. However, mild increased agitation this evening per wife.  O: BP 112/79 mmHg  Pulse 114  Temp(Src) 98.2 F (36.8 C) (Oral)  Resp 21  Ht 5\' 8"  (1.727 m)  Wt 185 lb 6.5 oz (84.1 kg)  BMI 28.20 kg/m2  SpO2 98% GEN: NAD, asleep PULM: Mild increased respiratory effort that wife states is his current baseline this hospitalization [place] in place, O2 at 3L flow  A/P:  Pt appears stable from respiratory standpoint overnight with no acute worsening to alter tx course overnight. With h/o alcoholism and wife reporting mild increased agitation this evening, along with mild tachycardia, will change klonopin TID PRN to BID scheduled and add CIWA protocol for increased monitoring overnight. Reassess in 1-2 days.  Leona Singleton, MD

## 2014-07-18 NOTE — Progress Notes (Signed)
CRITICAL VALUE ALERT  Critical value received:  Sodium 117  Date of notification:  07/18/14  Time of notification:  1813  Critical value read back: yes  Nurse who received alert:  Alesia Richards, RN  MD notified (1st page):  Merdis Delay, NP  Time of first page:  1906  MD notified (2nd page):  Time of second page:  Responding MD:    Time MD responded:    No new orders received at this time. Will continue to monitor patient closely.

## 2014-07-18 NOTE — Progress Notes (Signed)
Inpatient Diabetes Program Recommendations  AACE/ADA: New Consensus Statement on Inpatient Glycemic Control (2013)  Target Ranges:  Prepandial:   less than 140 mg/dL      Peak postprandial:   less than 180 mg/dL (1-2 hours)      Critically ill patients:  140 - 180 mg/dL   Results for CARY, BRICKHOUSE (MRN [id number]) as of 07/18/2014 08:43  Ref. Range 07/18/2014 00:16 07/18/2014 00:16 07/18/2014 00:41 07/18/2014 04:07  Glucose Latest Ref Range: 65-99 mg/dL 561 (H) 537 (H) 943 (H) 224 (H)   Diabetes history: No Outpatient Diabetes medications: NA Current orders for Inpatient glycemic control: None  Inpatient Diabetes Program Recommendations Correction (SSI): Fasting lab glucose 224 mg/dl this morning. While inpatient and ordered steroids, please consider ordering CBGs with Novolog correction. HgbA1C: Initial glucose 198 mg/dl. Please consider adding an A1C to blood in lab to evaluate glycemic control over the past 2-3 months.  Thanks, Orlando Penner, RN, MSN, CCRN, CDE Diabetes Coordinator Inpatient Diabetes Program [phone] (Team Pager from 8am to 5pm) [phone] (AP office) [phone] [facility] office) [phone] [facility] office)

## 2014-07-18 NOTE — Progress Notes (Signed)
Patient is resting comfortably and vitals are stable with no evidence of respiratory distress, did not place him on BIPAP at this time. RT will continue to monitor.

## 2014-07-18 NOTE — Progress Notes (Signed)
CRITICAL VALUE ALERT  Critical value received:  Sodium 117  Date of notification:  07/18/14  Time of notification:  1330  Critical value read back: yes  Nurse who received alert:  Hyui, RN  MD notified (1st page):  Delynn Flavin, MD  Time of first page:  1350  MD notified (2nd page):  Time of second page:  Responding MD: Nadine Counts  Time MD responded:  1350  No new orders received. Will continue to monitor patient closely.

## 2014-07-18 NOTE — Progress Notes (Signed)
RN called report at 0226, pt arrived to 2C02 at 12. Pt is on bipap and vital signs are stable.  Wife is at the bedside.  RN will continue to monitor.

## 2014-07-19 ENCOUNTER — Encounter (HOSPITAL_COMMUNITY): Payer: Self-pay | Admitting: General Practice

## 2014-07-19 DIAGNOSIS — F1011 Alcohol abuse, in remission: Secondary | ICD-10-CM | POA: Insufficient documentation

## 2014-07-19 DIAGNOSIS — J441 Chronic obstructive pulmonary disease with (acute) exacerbation: Principal | ICD-10-CM

## 2014-07-19 LAB — BASIC METABOLIC PANEL
Anion gap: 9 (ref 5–15)
Anion gap: 9 (ref 5–15)
BUN: 18 mg/dL (ref 6–20)
BUN: 15 mg/dL (ref 6–20)
CALCIUM: 8.6 mg/dL — AB (ref 8.9–10.3)
CO2: 27 mmol/L (ref 22–32)
CO2: 28 mmol/L (ref 22–32)
CREATININE: 0.82 mg/dL (ref 0.61–1.24)
Calcium: 8.6 mg/dL — ABNORMAL LOW (ref 8.9–10.3)
Chloride: 83 mmol/L — ABNORMAL LOW (ref 101–111)
Chloride: 83 mmol/L — ABNORMAL LOW (ref 101–111)
Creatinine, Ser: 0.87 mg/dL (ref 0.61–1.24)
GFR calc Af Amer: >60 mL/min (ref >60)
GFR calc Af Amer: >60 mL/min (ref >60)
GFR calc non Af Amer: >60 mL/min (ref >60)
GFR calc non Af Amer: >60 mL/min (ref >60)
GLUCOSE: 131 mg/dL — AB (ref 65–99)
Glucose, Bld: 209 mg/dL — ABNORMAL HIGH (ref 65–99)
Potassium: 4.2 mmol/L (ref 3.5–5.1)
Potassium: 4 mmol/L (ref 3.5–5.1)
SODIUM: 119 mmol/L — AB (ref 135–145)
Sodium: 120 mmol/L — ABNORMAL LOW (ref 135–145)

## 2014-07-19 LAB — CBC
HEMATOCRIT: 34.4 % — AB (ref 39.0–52.0)
Hemoglobin: 12.2 g/dL — ABNORMAL LOW (ref 13.0–17.0)
MCH: 30.9 pg (ref 26.0–34.0)
MCHC: 35.5 g/dL (ref 30.0–36.0)
MCV: 87.1 fL (ref 78.0–100.0)
Platelets: 251 10*3/uL (ref 150–400)
RBC: 3.95 MIL/uL — ABNORMAL LOW (ref 4.22–5.81)
RDW: 13.5 % (ref 11.5–15.5)
WBC: 13.1 10*3/uL — ABNORMAL HIGH (ref 4.0–10.5)

## 2014-07-19 LAB — SODIUM, URINE, RANDOM: Sodium, Ur: 17 mmol/L

## 2014-07-19 LAB — GLUCOSE, CAPILLARY
GLUCOSE-CAPILLARY: 143 mg/dL — AB (ref 65–99)
GLUCOSE-CAPILLARY: 213 mg/dL — AB (ref 65–99)
Glucose-Capillary: 167 mg/dL — ABNORMAL HIGH (ref 65–99)
Glucose-Capillary: 286 mg/dL — ABNORMAL HIGH (ref 65–99)
Glucose-Capillary: 195 mg/dL — ABNORMAL HIGH (ref 65–99)

## 2014-07-19 MED ORDER — FUROSEMIDE 40 MG PO TABS
40.0000 mg | ORAL_TABLET | Freq: Two times a day (BID) | ORAL | Status: DC
  Filled 2014-07-19 (×2): qty 1

## 2014-07-19 MED ORDER — FOLIC ACID 1 MG PO TABS
1.0000 mg | ORAL_TABLET | Freq: Every day | ORAL | Status: DC
  Administered 2014-07-19 – 2014-07-20 (×2): 1 mg via ORAL
  Filled 2014-07-19 (×2): qty 1

## 2014-07-19 MED ORDER — SALINE SPRAY 0.65 % NA SOLN
1.0000 | NASAL | Status: DC | PRN
  Filled 2014-07-19: qty 44

## 2014-07-19 MED ORDER — VITAMIN B-1 100 MG PO TABS
100.0000 mg | ORAL_TABLET | Freq: Every day | ORAL | Status: DC
  Administered 2014-07-19 – 2014-07-20 (×2): 100 mg via ORAL
  Filled 2014-07-19 (×2): qty 1

## 2014-07-19 MED ORDER — GUAIFENESIN ER 600 MG PO TB12
600.0000 mg | ORAL_TABLET | Freq: Two times a day (BID) | ORAL | Status: DC
  Administered 2014-07-19 (×2): 600 mg via ORAL
  Filled 2014-07-19 (×4): qty 1

## 2014-07-19 MED ORDER — OXYMETAZOLINE HCL 0.05 % NA SOLN
1.0000 | Freq: Two times a day (BID) | NASAL | Status: DC | PRN
  Filled 2014-07-19: qty 15

## 2014-07-19 NOTE — Progress Notes (Signed)
Juan [name] is a 64 y.o. male patient who transferred  from [facility] 02 awake, alert  & orientated  X 3, Full Code, VSS - Blood pressure 119/72, pulse 104, temperature 98.4 F (36.9 C), temperature source Oral, resp. rate 20, height 5\' 8"  (1.727 m), weight 82 kg (180 lb 12.4 oz), SpO2 98 %., O2   @ 4 L nasal cannular, no c/o shortness of breath, no c/o chest pain, no distress noted. Non-Tele  placed and pt is currently running:normal sinus rhythm.   IV site WDL: antecubital left, condition patent and no redness with a transparent dsg that's clean dry and intact.  Allergies:  No Known Allergies   Past Medical History  Diagnosis Date  . CHF (congestive heart failure) 10/2010    ECHO:  EF 40%, Grade II diastolic dysfunction  . Alcoholism   . Allergy     seasonal   . Anxiety   . Depression   . Asthma   . Hyperlipidemia   . Hypertension   . Seizures   . COPD (chronic obstructive pulmonary disease)     Pt orientation to unit, room and routine. SR up x 2, fall risk assessment complete with Patient and family verbalizing understanding of risks associated with falls. Pt verbalizes an understanding of how to use the call bell and to call for help before getting out of bed.  Skin, clean-dry- intact without evidence of bruising, pt. Has dry peeling skin to bilat. Ankle/ heel area.   No evidence of skin break down noted on exam.     Will cont to monitor and assist as needed.  Joana Reamer, RN 07/19/2014 5:53 PM

## 2014-07-19 NOTE — Discharge Summary (Signed)
Family Medicine Teaching [facility] Discharge Summary  Patient name: Juan [name] Medical record number: [id number] Date of birth: Apr 21, 1950 Age: 64 y.o. Gender: male Date of Admission: 07/17/2014  Date of Discharge: 07/20/14 Admitting Physician: Carney Living, MD  Primary Care Provider: Renold Don, MD Consultants: none  Indication for Hospitalization: dyspnea, COPD exacerbation  Discharge Diagnoses/Problem List:  Acute respiratory failure H/o ETOH abuse  Disposition: Discharge home with wife  Discharge Condition: stable  Discharge Exam:  Blood pressure 128/86, pulse 111, temperature 98.6 F (37 C), temperature source Oral, resp. rate 18, height  (1.727 m), weight 180 lb 12.4 oz (82 kg), SpO2 96 %. General: awake, alert, NAD, sitting in bed eating Cardiovascular: tachycardic, regular rhythm, no murmurs Respiratory: global mild expiratory wheeze (greatly improved, has not received am trx yet), good air movement, [place] not in place (sitting on top of head) Abdomen: obese, soft, NT/ND, large nontender umbilical hernia Extremities: +1 DP, trace edema, moves extremities independently Neuro: AOx3, follows commands Psych: mood stable, speech normal  Brief Hospital Course:  Juan [name] is a 64 y.o. male that presented with respiratory distress. PMH is significant for Bronchiectasis vs COPD, HLD, HTN, EtOH abuse.  Per ED report, patient received albuterol, CPAP and  of Solu-Medrol while en route via EMS. Upon arrival to the ED, patient received CAT and atrovent in addition to enalapril IV for elevated blood pressure.  On admission, patient with decreased air movement throughout and global wheeze.  He was tachycardic.  ABG reassuring.  BNP 500.  WBC 19.2. (thought to be 2/2 steroids, as patient was afebrile).  CXR unremarkable.  He was admitted to the stepdown unit for close monitoring.  He required BiPAP.  Patient was continued on scheduled duonebs and PRN  albuterol.  He was continued on Prednisone and Levaquin for suspected COPD exacerbation and completed a 5 day treatment course.  Patient was ultimately weaned to RA.  Patient also noted to be hyponatremic.  Na 112 on admission.  Patient has a h/o chronic hyponatremia and was on Lasix ans Salt tablets for this outpatient.  Compliance with these medications was questionable.  He was restarted on his home medications and Na was trended.  Lasix briefly held to evaluate urine sodium and osmolality.  These labs were suggestive of CHF/Cirrhosis etiology.  Sodium level had greatly improved to 129 at discharge.  Although patient reported ETOH cessation since last hospital admission, he was placed on CIWA protocol.  He exhibited some anxiety during hospitalization 2/2 concerns over nasal congestion.  CIWA was discontinued.  Patient was discharged in stable condition.  Discharge instructions and return precautions were reviewed with patient, who voiced good understanding.  Issues for Follow Up:  1. Am cortisol once prednisone completed. 2. Referral to pulmonology.  Wife requesting this. 3. Na.  Compliance with Lasix/ Na tablets  Significant Procedures: none  Significant Labs and Imaging:   Recent Labs Lab 07/18/14 0407 07/19/14 0410 07/20/14 0855  WBC 16.5* 13.1* 16.3*  HGB 13.6 12.2* 12.6*  HCT 37.2* 34.4* 36.7*  PLT 286 251 266    Recent Labs Lab 07/18/14 0016  07/18/14 0407  07/18/14 1718 07/18/14 2136 07/19/14 0410 07/19/14 0940 07/20/14 0855  NA 112*  111*  < > 113*  < > 117* 117* 119* 120* 129*  K 4.8  4.7  < > 3.9  < > 5.0 5.2* 4.2 4.0 3.7  CL 77*  80*  < > 77*  < > 81* 78* 83* 83* 90*  CO2 22  --  22  < > GLUCOSE 186*  198*  < > 224*  < > 151* 236* 131* 209* 147*  BUN <5*  5*  < > 5*  < > CREATININE 0.71  0.60*  < > 0.79  < > 0.88 0.99 0.82 0.87 0.83  CALCIUM 8.6*  --  8.5*  < > 8.8* 8.7* 8.6* 8.6* 9.1  ALKPHOS 86  --  88  --   --   --    --   --   --   AST 37  --  19  --   --   --   --   --   --   ALT 14*  --  17  --   --   --   --   --   --   ALBUMIN 3.7  --  3.4*  --   --   --   --   --   --   < > = values in this interval not displayed.  UNa: 17, UOsm: 195  Dg Chest Port 1 View  07/18/2014   CLINICAL DATA:  Acute onset of respiratory distress. Initial encounter.  EXAM: PORTABLE CHEST - 1 VIEW  COMPARISON:  Chest radiograph performed 06/26/2014  FINDINGS: The lungs are well-aerated. Minimal bilateral atelectasis is noted. There is no evidence of pleural effusion or pneumothorax.  The cardiomediastinal silhouette is within normal limits. No acute osseous abnormalities are seen.  IMPRESSION: Minimal bilateral atelectasis noted; lungs otherwise clear.   Electronically Signed   By: Roanna Raider M.D.   On: 07/18/2014 00:18   Results/Tests Pending at Time of Discharge: none  Discharge Medications:    Medication List    TAKE these medications        acetaminophen 500 MG tablet  Commonly known as:  TYLENOL  Take 1,500 mg by mouth every 6 (six) hours as needed for headache (headache).     albuterol (2.5 MG/3ML) 0.083% nebulizer solution  Commonly known as:  PROVENTIL  Take 3 mLs (2.5 mg total) by nebulization every 6 (six) hours as needed for wheezing or shortness of breath.     aspirin 81 MG EC tablet  Take 1 tablet (81 mg total) by mouth daily.     atorvastatin 40 MG tablet  Commonly known as:  LIPITOR  Take 1 tablet (40 mg total) by mouth daily at 6 PM.     B-Complex Caps  Take 1 capsule by mouth daily.     carvedilol 25 MG tablet  Commonly known as:  COREG  Take 1 tablet (25 mg total) by mouth 2 (two) times daily with a meal.     clonazePAM 0.5 MG tablet  Commonly known as:  KLONOPIN  TAKE ONE TABLET BY MOUTH TWICE DAILY AS NEEDED FOR ANXIETY     dicyclomine 20 MG tablet  Commonly known as:  BENTYL  Take 1 tablet (20 mg total) by mouth 3 (three) times daily before meals.     DULoxetine 30 MG capsule   Commonly known as:  CYMBALTA  Take 1 capsule (30 mg total) by mouth daily.     fluticasone 50 MCG/ACT nasal spray  Commonly known as:  FLONASE  Use two sprays in each nostril daily     furosemide 40 MG tablet  Commonly known as:  LASIX  Take 1 tablet (40 mg total) by mouth daily.  gabapentin 300 MG capsule  Commonly known as:  NEURONTIN  TAKE ONE CAPSULE BY MOUTH THREE TIMES DAILY     guaiFENesin 600 MG 12 hr tablet  Commonly known as:  MUCINEX  Take 2 tablets (1,200 mg total) by mouth 2 (two) times daily.     Ipratropium-Albuterol 20-100 MCG/ACT Aers respimat  Commonly known as:  COMBIVENT RESPIMAT  INHALE TWO PUFFS BY MOUTH EVERY FOUR HOURS as needed for wheezing     levofloxacin 500 MG tablet  Commonly known as:  LEVAQUIN  Take 1 tablet (500 mg total) by mouth daily.     lisinopril 5 MG tablet  Commonly known as:  PRINIVIL,ZESTRIL  Take 1 tablet (5 mg total) by mouth daily.     Magnesium 500 MG Tabs  Take 500 mg by mouth daily.     montelukast 10 MG tablet  Commonly known as:  SINGULAIR  Take 1 tablet (10 mg total) by mouth at bedtime.     multivitamin with minerals Tabs tablet  Take 1 tablet by mouth daily.     potassium chloride SA 20 MEQ tablet  Commonly known as:  K-DUR,KLOR-CON  Take 2 tablets (40 mEq total) by mouth daily.     predniSONE 20 MG tablet  Commonly known as:  DELTASONE  Take 2 tablets (40 mg total) by mouth daily with breakfast.     sodium chloride 1 G tablet  Take 1 g by mouth 2 (two) times daily with a meal.     spironolactone 25 MG tablet  Commonly known as:  ALDACTONE  TAKE HALF TABLET BY MOUTH DAILY     thiamine 100 MG tablet  Take 1 tablet (100 mg total) by mouth daily.     tiotropium 18 MCG inhalation capsule  Commonly known as:  SPIRIVA  Place 1 capsule (18 mcg total) into inhaler and inhale daily.     triamcinolone 0.025 % ointment  Commonly known as:  KENALOG  Apply 1 application topically 2 (two) times daily.      Vitamin D (Ergocalciferol) 50000 UNITS Caps capsule  Commonly known as:  DRISDOL  TAKE 1 CAPSULE BY MOUTH EVERY 7 DAYS.        Discharge Instructions: Please refer to Patient Instructions section of EMR for full details.  Patient was counseled important signs and symptoms that should prompt return to medical care, changes in medications, dietary instructions, activity restrictions, and follow up appointments.   Follow-Up Appointments: Follow-up Information    Follow up with Delynn Flavin, DO. Go on 07/27/2014.   Specialty:  Family Medicine   Why:  2:30pm hospital follow up   Contact information:   [address] [place] [address] [phone]       Raliegh Ip, DO 07/21/2014, 5:12 PM PGY-1, [facility] Family [facility]

## 2014-07-19 NOTE — Progress Notes (Signed)
Family Medicine Teaching Service Daily Progress Note Intern Pager: [phone]  Patient name: Juan [name] Medical record number: [id number] Date of birth: 31-Aug-1950 Age: 64 y.o. Gender: male  Primary Care Provider: Renold Don, MD Consultants: none Code Status: Full  Pt Overview and Major Events to Date:  06/13: Admitted; Klonopin BID w/ CIWA added overnight  Assessment and Plan: Juan [name] is a 64 y.o. male presenting with respiratory distress. PMH is significant for Bronchiectasis vs COPD, HLD, HTN, EtOH abuse.   #Acute respiratory distress: patient with underlying lung disease, bronchiectasis, in addition to cardiac disease (CHF, although last EF of 55-60% without mention of diastolic dysfunction). Lungs sound very wheezy and are with minimal air movement. BNP elevated to ~500 and weight appears to be around dry weight (184lb from char review). PE a possibility with tachycardia, although this is post-albuterol. Well score 1.5 for tachycardia (which can be explained by albuterol as mentioned). Delirium tremens a possibility, however, apart from tachycardia, patient not exhibiting signs of withdrawal. ABG reassuring. Last drink about one month ago. Currently with labored breathing. Mentation is normal. Per patient, albuterol/atrovent helped in ED. Also received solu-medrol which may have helped.  - BiPap PRN to keep O2 sats >90%, currently on 3L [place] - Wean O2 as able - Duonebs q6hrs - Albuterol q4hrs PRN (none overnight) - continue steroids (06/13>) - Levofloxacin 500mg  IV qD (06/13>); transition to PO 6/14 - consider pulmonology consult, as PCP was trying to set this up - Will transfer to floor this afternoon if stable  #Leukocytosis: with tachypnea, tachycardia and slight hypothermia on admission, slight concern for sepsis picture, however, tachypnea explained by exacerbation, tachycardia secondary to albuterol nebulizer treatments and hypothermia possibly related to underlying  cirrhosis. Leukocytosis most likely secondary to steroids. Improving this am 19.2>16.2>13.1 - Blood culture pending - will watch patient closely. If develops low grade fevers, will likely start empiric abx  #Hyponatremia, severe:  Improving 119 this am  most likely chronic. Sodium of 112 on admission. Baseline appears to be in mid 120s to 130. Patient on salt tablets at home but has not been taking them regularly. Does not appear to be symptomatic, however, currently with severe hyponatremia. - lasix 40mg  BID, Salt tabs resumed 6/13 - watch Bmet closely; aiming for ~22meq rise per 24 hour period - neuro checks - saline lock IV  #History of alcohol abuse: states last drink was over one month ago. This is confirmed via PCP notes. - continue outpatient management - EtOH - folate - B1 - Klonopin BID, CIWA Ativan PRN  #HTN: Normotensive. s/p enalapril IV in ED for blood pressures in 190s/130s recorded in MAR - Hold BP medications for now - follow-up BPs  FEN/GI: SLIV, HH diet Prophylaxis: heparin subq  Disposition: Discharge pending improvement in respiratory symptoms  Subjective:  Patient reports that breathing does not feel improved.  He states he feels congested in his chest and his nose.  He reports that anxiety seems worse as well.  Denies CP, abdominal pain, N/V.  Objective: Temp:  [97.7 F (36.5 C)-99.5 F (37.5 C)] 98.1 F (36.7 C) (06/14 0400) Pulse Rate:  [94-114] 99 (06/14 0400) Resp:  [14-27] 16 (06/14 0400) BP: (97-126)/(57-83) 104/60 mmHg (06/14 0400) SpO2:  [93 %-99 %] 96 % (06/14 0400) Weight:  [180 lb 12.4 oz (82 kg)] 180 lb 12.4 oz (82 kg) (06/14 0400) Physical Exam: General: awake, alert, NAD, wife at bedside Cardiovascular: tachycardic, regular rhythm, no murmurs Respiratory: global inspiratory and expiratory wheeze (has  not received am trx yet), 3L [place] in place Abdomen: obese, soft, NT/ND, large nontender umbilical hernia Extremities: +1 DP, no edema, moves  extremities independently  Laboratory:  Recent Labs Lab 07/18/14 0016 07/18/14 0041 07/18/14 0407 07/19/14 0410  WBC 19.2*  --  16.5* 13.1*  HGB 14.4  16.3 16.3 13.6 12.2*  HCT 39.3  48.0 48.0 37.2* 34.4*  PLT 320  --  286 251    Recent Labs Lab 07/18/14 0016  07/18/14 0407  07/18/14 1718 07/18/14 2136 07/19/14 0410  NA 112*  111*  < > 113*  < > 117* 117* 119*  K 4.8  4.7  < > 3.9  < > 5.0 5.2* 4.2  CL 77*  80*  < > 77*  < > 81* 78* 83*  CO2 22  --  22  < > BUN <5*  5*  < > 5*  < > CREATININE 0.71  0.60*  < > 0.79  < > 0.88 0.99 0.82  CALCIUM 8.6*  --  8.5*  < > 8.8* 8.7* 8.6*  PROT 6.6  --  7.3  --   --   --   --   BILITOT 1.4*  --  0.4  --   --   --   --   ALKPHOS 86  --  88  --   --   --   --   ALT 14*  --  17  --   --   --   --   AST 37  --  19  --   --   --   --   GLUCOSE 186*  198*  < > 224*  < > 151* 236* 131*  < > = values in this interval not displayed.  Imaging/Diagnostic Tests:   Raliegh Ip, DO 07/19/2014, 6:50 AM PGY-1, [facility] Family [facility] FPTS Intern pager: [phone], text pages welcome

## 2014-07-19 NOTE — Progress Notes (Signed)
Utilization Review Completed.  

## 2014-07-20 LAB — BASIC METABOLIC PANEL
Anion gap: 10 (ref 5–15)
BUN: 12 mg/dL (ref 6–20)
CHLORIDE: 90 mmol/L — AB (ref 101–111)
CO2: 29 mmol/L (ref 22–32)
CREATININE: 0.83 mg/dL (ref 0.61–1.24)
Calcium: 9.1 mg/dL (ref 8.9–10.3)
GFR calc Af Amer: >60 mL/min (ref >60)
GLUCOSE: 147 mg/dL — AB (ref 65–99)
Potassium: 3.7 mmol/L (ref 3.5–5.1)
Sodium: 129 mmol/L — ABNORMAL LOW (ref 135–145)

## 2014-07-20 LAB — CBC
HCT: 36.7 % — ABNORMAL LOW (ref 39.0–52.0)
Hemoglobin: 12.6 g/dL — ABNORMAL LOW (ref 13.0–17.0)
MCH: 31.6 pg (ref 26.0–34.0)
MCHC: 34.3 g/dL (ref 30.0–36.0)
MCV: 92 fL (ref 78.0–100.0)
PLATELETS: 266 10*3/uL (ref 150–400)
RBC: 3.99 MIL/uL — AB (ref 4.22–5.81)
RDW: 13.6 % (ref 11.5–15.5)
WBC: 16.3 10*3/uL — ABNORMAL HIGH (ref 4.0–10.5)

## 2014-07-20 LAB — OSMOLALITY, URINE: OSMOLALITY UR: 264 mosm/kg — AB (ref 390–1090)

## 2014-07-20 LAB — GLUCOSE, CAPILLARY
GLUCOSE-CAPILLARY: 122 mg/dL — AB (ref 65–99)
Glucose-Capillary: 146 mg/dL — ABNORMAL HIGH (ref 65–99)

## 2014-07-20 LAB — HEMOGLOBIN A1C
HEMOGLOBIN A1C: 6.6 % — AB (ref 4.8–5.6)
Mean Plasma Glucose: 143 mg/dL

## 2014-07-20 MED ORDER — LEVOFLOXACIN 500 MG PO TABS: 500.0000 mg | ORAL_TABLET | Freq: Every day | ORAL | Status: DC

## 2014-07-20 MED ORDER — PREDNISONE 20 MG PO TABS: 40.0000 mg | ORAL_TABLET | Freq: Every day | ORAL | Status: DC

## 2014-07-20 MED ORDER — IPRATROPIUM-ALBUTEROL 0.5-2.5 (3) MG/3ML IN SOLN: 3.0000 mL | Freq: Four times a day (QID) | RESPIRATORY_TRACT | Status: DC | PRN

## 2014-07-20 MED ORDER — GUAIFENESIN ER 600 MG PO TB12: 1200.0000 mg | ORAL_TABLET | Freq: Two times a day (BID) | ORAL | Status: DC

## 2014-07-20 MED ORDER — GUAIFENESIN ER 600 MG PO TB12
1200.0000 mg | ORAL_TABLET | Freq: Two times a day (BID) | ORAL | Status: DC
  Administered 2014-07-20: 1200 mg via ORAL
  Filled 2014-07-20 (×2): qty 2

## 2014-07-20 NOTE — Discharge Instructions (Signed)
You were admitted for a COPD exacerbation.  You were treated with antibiotics and steroids.  You should continue to take these medications for the next 2 days.  You can continue taking Mucinex for chest congestion and Afrin twice daily for the next 2 days.  If you continue to have nasal congestion after that you may use saline nasal drops/spray.  You have been scheduled for a hospital follow up visit on 6/22 with Dr Nadine Counts.  Dr Gwendolyn Grant has no appointment at this time until 7/1.  We will discuss referral to pulmonology at the hospital visit.    Chronic Obstructive Pulmonary Disease Chronic obstructive pulmonary disease (COPD) is a common lung problem. In COPD, the flow of air from the lungs is limited. The way your lungs work will probably never return to normal, but there are things you can do to improve your lungs and make yourself feel better. HOME CARE  Take all medicines as told by your doctor.  Avoid medicines or cough syrups that dry up your airway (such as antihistamines) and do not allow you to get rid of thick spit. You do not need to avoid them if told differently by your doctor.  If you smoke, stop. Smoking makes the problem worse.  Avoid being around things that make your breathing worse (like smoke, chemicals, and fumes).  Use oxygen therapy and therapy to help improve your lungs (pulmonary rehabilitation) if told by your doctor. If you need home oxygen therapy, ask your doctor if you should buy a tool to measure your oxygen level (oximeter).  Avoid people who have a sickness you can catch (contagious).  Avoid going outside when it is very hot, cold, or humid.  Eat healthy foods. Eat smaller meals more often. Rest before meals.  Stay active, but remember to also rest.  Make sure to get all the shots (vaccines) your doctor recommends. Ask your doctor if you need a pneumonia shot.  Learn and use tips on how to relax.  Learn and use tips on how to control your breathing as  told by your doctor. Try:  Breathing in (inhaling) through your nose for 1 second. Then, pucker your lips and breath out (exhale) through your lips for 2 seconds.  Putting one hand on your belly (abdomen). Breathe in slowly through your nose for 1 second. Your hand on your belly should move out. Pucker your lips and breathe out slowly through your lips. Your hand on your belly should move in as you breathe out.  Learn and use controlled coughing to clear thick spit from your lungs. The steps are: 1. Lean your head a little forward. 2. Breathe in deeply. 3. Try to hold your breath for 3 seconds. 4. Keep your mouth slightly open while coughing 2 times. 5. Spit any thick spit out into a tissue. 6. Rest and do the steps again 1 or 2 times as needed. GET HELP IF:  You cough up more thick spit than usual.  There is a change in the color or thickness of the spit.  It is harder to breathe than usual.  Your breathing is faster than usual. GET HELP RIGHT AWAY IF:   You have shortness of breath while resting.  You have shortness of breath that stops you from:  Being able to talk.  Doing normal activities.  You chest hurts for longer than 5 minutes.  Your skin color is more blue than usual.  Your pulse oximeter shows that you have low oxygen for  longer than 5 minutes. MAKE SURE YOU:   Understand these instructions.  Will watch your condition.  Will get help right away if you are not doing well or get worse. Document Released: 07/10/2007 Document Revised: 06/07/2013 Document Reviewed: 09/17/2012 [facility] Patient Information 2015 [place], [place]. This information is not intended to replace advice given to you by your health care provider. Make sure you discuss any questions you have with your health care provider.

## 2014-07-20 NOTE — Progress Notes (Signed)
Family Medicine Teaching Service Daily Progress Note Intern Pager: [phone]  Patient name: Juan [name] Medical record number: [id number] Date of birth: 11-Jan-1951 Age: 64 y.o. Gender: male  Primary Care Provider: Renold Don, MD Consultants: none Code Status: Full  Pt Overview and Major Events to Date:  06/13: Admitted; Klonopin BID w/ CIWA added overnight 06/4: transferred to telemetry bed  Assessment and Plan: Juan [name] is a 64 y.o. male presenting with respiratory distress. PMH is significant for Bronchiectasis vs COPD, HLD, HTN, EtOH abuse.   #Acute respiratory distress: patient with underlying lung disease, bronchiectasis, in addition to cardiac disease (CHF, although last EF of 55-60% without mention of diastolic dysfunction). Lungs sound very wheezy and are with minimal air movement. BNP elevated to ~500 and weight appears to be around dry weight (184lb from char review). PE a possibility with tachycardia, although this is post-albuterol. Well score 1.5 for tachycardia (which can be explained by albuterol as mentioned). Delirium tremens a possibility, however, apart from tachycardia, patient not exhibiting signs of withdrawal. ABG reassuring. Last drink about one month ago. Currently with labored breathing. Mentation is normal. Per patient, albuterol/atrovent helped in ED. Also received solu-medrol which may have helped.  - Currently on 4L [place] but does not have [place] in place and breathing well on RA, Discussed with RN, she will wean off O2  - Duonebs q6hrs - Albuterol q4hrs PRN (none> 24h) - continue steroids (06/13>) - Levofloxacin 500mg  IV qD (06/13>); transition to PO 6/14 - consider pulmonology consult, as PCP was trying to set this up  #Leukocytosis: with tachypnea, tachycardia and slight hypothermia on admission, slight concern for sepsis picture, however, tachypnea explained by exacerbation, tachycardia secondary to albuterol nebulizer treatments and hypothermia  possibly related to underlying cirrhosis. Leukocytosis most likely secondary to steroids. Improving this am 19.2>16.2>13.1 - Blood culture pending - will watch patient closely. If develops low grade fevers, will likely start empiric abx  #Hyponatremia, severe:  Improving 120, am labs pending. Sodium of 112 on admission. Baseline appears to be in mid 120s to 130. Patient on salt tablets at home but has not been taking them regularly. Does not appear to be symptomatic, however, currently with severe hyponatremia. UNa: 17, UOsm: 195, suggestive of CHF/Cirrhosis etiology - Resume lasix 40mg  BID  - Salt tabs resumed 6/13 - watch Bmet closely; aiming for ~4meq rise per 24 hour period - neuro checks - saline lock IV  #History of alcohol abuse: states last drink was over one month ago. This is confirmed via PCP notes. CIWA 3,1,2 overnight - continue outpatient management - EtOH - folate - B1 - Klonopin BID, CIWA Ativan PRN (none needed ovrnt)  #HTN: Normotensive. s/p enalapril IV in ED for blood pressures in 190s/130s recorded in MAR - Hold BP medications for now - follow-up BPs  FEN/GI: SLIV, HH diet Prophylaxis: heparin subq  Disposition: Discharge pending improvement in respiratory symptoms  Subjective:  Patient reports that breathing is stable.  He reports that he continues to cough up phlegm but feels that the Mucinex is working to break the congestion up.  He reports that anxiety worse than baseline because he has congestion.  Feels like nasal congestion is getting better with nasal spray.  Denies CP, abdominal pain, N/V.  Patient asking when he can go home.  We discussed that he needs to be weaned to RA before this can be considered.  Voices good understanding.  Objective: Temp:  [98.1 F (36.7 C)-98.6 F (37 C)] 98.6 F (37  C) (06/15 0530) Pulse Rate:  [89-121] 102 (06/15 0530) Resp:  [16-24] 22 (06/15 0530) BP: (97-140)/(54-84) 140/83 mmHg (06/15 0530) SpO2:  [92 %-100 %] 98 %  (06/15 0530) FiO2 (%):  [40 %] 40 % (06/14 1206) Physical Exam: General: awake, alert, NAD, sitting in bed eating Cardiovascular: tachycardic, regular rhythm, no murmurs Respiratory: global mild expiratory wheeze (greatly improved, has not received am trx yet), good air movement, [place] not in place (sitting on top of head) Abdomen: obese, soft, NT/ND, large nontender umbilical hernia Extremities: +1 DP, trace edema, moves extremities independently Neuro: AOx3, follows commands  Laboratory:  Recent Labs Lab 07/18/14 0016 07/18/14 0041 07/18/14 0407 07/19/14 0410  WBC 19.2*  --  16.5* 13.1*  HGB 14.4  16.3 16.3 13.6 12.2*  HCT 39.3  48.0 48.0 37.2* 34.4*  PLT 320  --  286 251    Recent Labs Lab 07/18/14 0016  07/18/14 0407  07/18/14 2136 07/19/14 0410 07/19/14 0940  NA 112*  111*  < > 113*  < > 117* 119* 120*  K 4.8  4.7  < > 3.9  < > 5.2* 4.2 4.0  CL 77*  80*  < > 77*  < > 78* 83* 83*  CO2 22  --  22  < > BUN <5*  5*  < > 5*  < > CREATININE 0.71  0.60*  < > 0.79  < > 0.99 0.82 0.87  CALCIUM 8.6*  --  8.5*  < > 8.7* 8.6* 8.6*  PROT 6.6  --  7.3  --   --   --   --   BILITOT 1.4*  --  0.4  --   --   --   --   ALKPHOS 86  --  88  --   --   --   --   ALT 14*  --  17  --   --   --   --   AST 37  --  19  --   --   --   --   GLUCOSE 186*  198*  < > 224*  < > 236* 131* 209*  < > = values in this interval not displayed.  Imaging/Diagnostic Tests:   Raliegh Ip, DO 07/20/2014, 7:21 AM PGY-1, [facility] Family Medicine FPTS Intern pager: [phone], text pages welcome

## 2014-07-20 NOTE — Progress Notes (Signed)
SATURATION QUALIFICATIONS: (This note is used to comply with regulatory documentation for home oxygen)  Patient Saturations on Room Air at Rest = 96%  Patient Saturations on Room Air while Ambulating = 91%  Patient Saturations on 4 Liters of oxygen while Ambulating = 98%  Please briefly explain why patient needs home oxygen: patient doesn't need oxygen at home

## 2014-07-20 NOTE — Progress Notes (Signed)
Patient was discharged home by MD order; discharged instructions review and give to patient and his wife with care notes; IV DIC; patient will be escorted to the car by nurse tech via wheelchair.  

## 2014-07-20 NOTE — Progress Notes (Signed)
Noted that HgbA1C on 6/14 was 6.6%. This would be considered a diagnosis of diabetes according to the American Diabetes Association guidelines.  Novolog SENSITIVE correction scale TID is ordered while in hospital. Smith Mince RN BSN CDE

## 2014-07-24 LAB — CULTURE, BLOOD (ROUTINE X 2)
Culture: NO GROWTH
Culture: NO GROWTH

## 2014-07-26 ENCOUNTER — Telehealth: Payer: Self-pay | Admitting: Family Medicine

## 2014-07-26 NOTE — Telephone Encounter (Signed)
Nursing staff - please call patient and inform him that he has a scheduled hospital follow up on 6/22, he will likely have labs completed then, thanks

## 2014-07-26 NOTE — Telephone Encounter (Signed)
Nursing staff - I incorrectly though that the patient had follow up on 6/22 (this was documented in the discharge summary), I suggest that the patient have follow up sooner than 7/1, please offer an appointment sooner

## 2014-07-26 NOTE — Telephone Encounter (Signed)
Wants to know if he needs his blood chemistry checked before July 1 since he had problems with that in the hospital

## 2014-07-27 ENCOUNTER — Telehealth: Payer: Self-pay | Admitting: Family Medicine

## 2014-07-27 ENCOUNTER — Other Ambulatory Visit: Payer: Self-pay | Admitting: Family Medicine

## 2014-07-27 ENCOUNTER — Inpatient Hospital Stay: Payer: Self-pay | Admitting: Family Medicine

## 2014-07-27 NOTE — Telephone Encounter (Signed)
Nurses note reviewed. Patient will follow up with PCP. No labs until seen by PCP.

## 2014-07-27 NOTE — Telephone Encounter (Signed)
Pt doesn't want to schedule an appt before July 1  It was requested by Dr Randolm Idol for him to be seen for hospital followup prior to July 1 Pt is adamant that he will only see Dr Gwendolyn Grant since that is his PCP

## 2014-07-27 NOTE — Telephone Encounter (Signed)
LMOVM for pt to return call.  Please have him schedule appt sooner than 08/05/14. Olar Santini, Maryjo [place]

## 2014-08-04 ENCOUNTER — Other Ambulatory Visit: Payer: Self-pay | Admitting: Family Medicine

## 2014-08-05 ENCOUNTER — Encounter: Payer: Self-pay | Admitting: Family Medicine

## 2014-08-05 ENCOUNTER — Ambulatory Visit (INDEPENDENT_AMBULATORY_CARE_PROVIDER_SITE_OTHER): Payer: No Typology Code available for payment source | Admitting: Family Medicine

## 2014-08-05 VITALS — BP 111/64 | HR 79 | Temp 98.3°F | Ht 68.0 in | Wt 187.0 lb

## 2014-08-05 DIAGNOSIS — E871 Hypo-osmolality and hyponatremia: Secondary | ICD-10-CM

## 2014-08-05 DIAGNOSIS — K429 Umbilical hernia without obstruction or gangrene: Secondary | ICD-10-CM

## 2014-08-05 DIAGNOSIS — J471 Bronchiectasis with (acute) exacerbation: Secondary | ICD-10-CM | POA: Diagnosis not present

## 2014-08-05 DIAGNOSIS — J329 Chronic sinusitis, unspecified: Secondary | ICD-10-CM | POA: Diagnosis not present

## 2014-08-05 DIAGNOSIS — F17210 Nicotine dependence, cigarettes, uncomplicated: Secondary | ICD-10-CM

## 2014-08-05 DIAGNOSIS — F17200 Nicotine dependence, unspecified, uncomplicated: Secondary | ICD-10-CM

## 2014-08-05 DIAGNOSIS — F1010 Alcohol abuse, uncomplicated: Secondary | ICD-10-CM | POA: Diagnosis not present

## 2014-08-05 LAB — BASIC METABOLIC PANEL
BUN: 9 mg/dL (ref 6–23)
CO2: 24 mEq/L (ref 19–32)
CREATININE: 1.03 mg/dL (ref 0.50–1.35)
Calcium: 9.1 mg/dL (ref 8.4–10.5)
Chloride: 96 mEq/L (ref 96–112)
Glucose, Bld: 123 mg/dL — ABNORMAL HIGH (ref 70–99)
POTASSIUM: 5.5 meq/L — AB (ref 3.5–5.3)
SODIUM: 132 meq/L — AB (ref 135–145)

## 2014-08-05 MED ORDER — AMOXICILLIN-POT CLAVULANATE 875-125 MG PO TABS: 1.0000 | ORAL_TABLET | Freq: Two times a day (BID) | ORAL | Status: DC

## 2014-08-05 NOTE — Progress Notes (Signed)
Subjective:    Juan [name] is a 64 y.o. male who presents to [facility] today for hospital FU:  1.  Hospitalization:  Patient admitted for COPD exacerbation and hypoxia.  Sounds like acute on chronic respriatory failure.  Placed on Bipap, able to be weaned to room air prior to discharge.  Also found to be hyponatremic to 112 on admit.  Improved to 129 prior to discharge.  Questioned medication adherence by inpatient team due to low sodium.  Since DC from hospital:  Left hospital about 2 weeks ago.  Since leaving, he's had decreasing requirements for his bronchodilator.  None this week.  He HAS had worsening sinus pressure on the Right side and post-nasal drip.  This has been occurring since he left the hospital back in May, for over 6 weeks. No fevers.  Can feel postnasal drip occurring at night. Triggers cough that awakens him.  He continues to smoke. He smokes about a pack a day. He has smoked a pack a day since he was a teenager. He has never had any prolonged period of time without smoking.  Prev health:    Currently overdue for shingles shot and colonoscopy.  Defers today.  ROS as above per HPI, otherwise neg.    The following portions of the patient's history were reviewed and updated as appropriate: allergies, current medications, past medical history, family and social history, and problem list. Patient is a nonsmoker.    PMH reviewed.  Past Medical History  Diagnosis Date  . CHF (congestive heart failure) 10/2010    ECHO:  EF 40%, Grade II diastolic dysfunction  . Alcoholism   . Allergy     seasonal   . Anxiety   . Depression   . Asthma   . Hyperlipidemia   . Hypertension   . Seizures   . COPD (chronic obstructive pulmonary disease)    Past Surgical History  Procedure Laterality Date  . Toenail excision Right 1967    Removal of ingrown toenail [Other]    Medications reviewed. Current Outpatient Prescriptions  Medication Sig Dispense Refill  . acetaminophen (TYLENOL)  500 MG tablet Take 1,500 mg by mouth every 6 (six) hours as needed for headache (headache).     Marland Kitchen albuterol (PROVENTIL) (2.5 MG/3ML) 0.083% nebulizer solution Take 3 mLs (2.5 mg total) by nebulization every 6 (six) hours as needed for wheezing or shortness of breath. 150 mL 1  . aspirin EC 81 MG EC tablet Take 1 tablet (81 mg total) by mouth daily. 90 tablet 0  . atorvastatin (LIPITOR) 40 MG tablet Take 1 tablet (40 mg total) by mouth daily at 6 PM. 30 tablet 6  . B-Complex CAPS Take 1 capsule by mouth daily.    . carvedilol (COREG) 25 MG tablet Take 1 tablet (25 mg total) by mouth 2 (two) times daily with a meal. 60 tablet 0  . clonazePAM (KLONOPIN) 0.5 MG tablet TAKE ONE TABLET BY MOUTH TWICE DAILY AS NEEDED FOR ANXIETY 60 tablet 1  . dicyclomine (BENTYL) 20 MG tablet Take 1 tablet (20 mg total) by mouth 3 (three) times daily before meals. (Patient taking differently: Take 20 mg by mouth 3 (three) times daily with meals as needed for spasms. ) 90 tablet 0  . DULoxetine (CYMBALTA) 30 MG capsule TAKE ONE CAPSULE BY MOUTH ONE TIME DAILY 30 capsule 3  . fluticasone (FLONASE) 50 MCG/ACT nasal spray Use two sprays in each nostril daily 16 g 11  . furosemide (LASIX) 40 MG tablet  Take 1 tablet (40 mg total) by mouth daily. 30 tablet 6  . gabapentin (NEURONTIN) 300 MG capsule TAKE ONE CAPSULE BY MOUTH THREE TIMES DAILY  (Patient not taking: Reported on 06/24/2014) 90 capsule 6  . guaiFENesin (MUCINEX) 600 MG 12 hr tablet Take 2 tablets (1,200 mg total) by mouth 2 (two) times daily. 20 tablet 0  . Ipratropium-Albuterol (COMBIVENT RESPIMAT) 20-100 MCG/ACT AERS respimat INHALE TWO PUFFS BY MOUTH EVERY FOUR HOURS as needed for wheezing 4 g 3  . levofloxacin (LEVAQUIN) 500 MG tablet Take 1 tablet (500 mg total) by mouth daily. 2 tablet 0  . lisinopril (PRINIVIL,ZESTRIL) 5 MG tablet TAKE 1 TABLET BY MOUTH EVERY DAY 30 tablet 3  . Magnesium 500 MG TABS Take 500 mg by mouth daily.    . montelukast (SINGULAIR) 10 MG  tablet Take 1 tablet (10 mg total) by mouth at bedtime. 30 tablet 3  . Multiple Vitamin (MULTIVITAMIN WITH MINERALS) TABS tablet Take 1 tablet by mouth daily.    . potassium chloride SA (K-DUR,KLOR-CON) 20 MEQ tablet Take 2 tablets (40 mEq total) by mouth daily. 60 tablet 6  . predniSONE (DELTASONE) 20 MG tablet Take 2 tablets (40 mg total) by mouth daily with breakfast. 4 tablet 0  . sodium chloride 1 G tablet Take 1 g by mouth 2 (two) times daily with a meal.    . spironolactone (ALDACTONE) 25 MG tablet TAKE HALF TABLET BY MOUTH DAILY  15 tablet 5  . thiamine 100 MG tablet Take 1 tablet (100 mg total) by mouth daily. (Patient not taking: Reported on 07/18/2014) 30 tablet 0  . tiotropium (SPIRIVA) 18 MCG inhalation capsule Place 1 capsule (18 mcg total) into inhaler and inhale daily. 30 capsule 6  . triamcinolone (KENALOG) 0.025 % ointment Apply 1 application topically 2 (two) times daily. (Patient taking differently: Apply 1 application topically 2 (two) times daily as needed (for rash). ) 30 g 0  . Vitamin D, Ergocalciferol, (DRISDOL) 50000 UNITS CAPS capsule TAKE 1 CAPSULE BY MOUTH EVERY 7 DAYS. 6 capsule 0  . [DISCONTINUED] albuterol (PROVENTIL,VENTOLIN) 90 MCG/ACT inhaler Inhale 2 puffs into the lungs every 6 (six) hours as needed for wheezing. 17 g 0   No current facility-administered medications for this visit.     Objective:   Physical Exam BP 111/64 mmHg  Pulse 79  Temp(Src) 98.3 F (36.8 C) (Oral)  Ht  (1.727 m)  Wt 187 lb (84.823 kg)  BMI 28.44 kg/m2 Gen:  Alert, cooperative patient who appears stated age in no acute distress.  Vital signs reviewed. HEENT: EOMI,  MMM.  TTP maxillary sinus on RIght side.  With significant purulence Right nare Cardiac:  Regular rate and rhythm  Pulm: Rhonchi BL bases and some Right upper lobe.  Otherwise fairly good air movement, though does have prolonged expiratory phase.   Abd:  Soft/nondistended/nontender.  Hernia reducible.  Exts: Non  edematous BL  LE, warm and well perfused.   No results found for this or any previous visit (from the past 72 hour(s)).

## 2014-08-05 NOTE — Assessment & Plan Note (Signed)
Rechecking be met today. Will call patient with results.

## 2014-08-05 NOTE — Assessment & Plan Note (Signed)
Currently stable. He has been abstinent of alcohol. Return to get him to quit smoking. At that point we will refer him for surgery. Has been evaluated for surgery and cleared from cardiac standpoint.  He would like to defer surgery for now.

## 2014-08-05 NOTE — Assessment & Plan Note (Signed)
Treat with Augmentin 10 days. He may need a prolonged course because of how long he has been suffering with this.

## 2014-08-05 NOTE — Assessment & Plan Note (Signed)
Sounds little more motivated today. As noted above referring to pharmacy clinic for discussion on smoking cessation. Discussed with him about matching patch to nicotine usage. He deferred further conversation until being seen approximately clinic.

## 2014-08-05 NOTE — Patient Instructions (Addendum)
Stop the Mucinex.    On your way out, tell the ladies up front to make an appointment with Dr. [facility].    I'll refer you to Pulmonology.  We'll try the Augmentin for your sinuses.    It was good to see you.  Come back to see me in 4 weeks to recheck your sodium.

## 2014-08-05 NOTE — Assessment & Plan Note (Signed)
Referral to pulmonologist today. Referral to pharmacy clinic for more extensive discussion on stopping smoking. He has tried the patch before in the past but it "did not work." He did not know the strength. History of the lozenges in the past with a little bit more success but again has never had any significant time of abstention from cigarette usage. Reviewed his CT scan images with it. I pointed out the areas of scarring and bronchiectasis as well as the persistent opacities. -I would like for pulmonology to comment on this if possible. He will be due for a repeat CT scan without contrast in August/September

## 2014-08-05 NOTE — Assessment & Plan Note (Signed)
Endorses abstinence. Encouraged continued abstinence

## 2014-08-09 ENCOUNTER — Telehealth: Payer: Self-pay | Admitting: Family Medicine

## 2014-08-09 DIAGNOSIS — E875 Hyperkalemia: Secondary | ICD-10-CM

## 2014-08-09 NOTE — Telephone Encounter (Signed)
LVM for pt to call back to inform him of below and to get his lab appointment scheduled. Juan [name], [name], [place]

## 2014-08-09 NOTE — Telephone Encounter (Signed)
Hey guys, would someone mind calling Juan [name] to let him know his sodium was good but that his potassium was a little high?  I'm in the hospital this week or would call myself.  He should schedule a lab appt for Wed or Thurs for a recheck.  I've put this in already. Thanks!

## 2014-08-10 ENCOUNTER — Telehealth: Payer: Self-pay | Admitting: *Deleted

## 2014-08-10 NOTE — Telephone Encounter (Signed)
2nd attempt to reach pt to inform him of below. Zimmerman Rumple, [name], CMA   

## 2014-08-10 NOTE — Telephone Encounter (Signed)
[name] called stating patient has been having diarrhea x 3 days.  Pt was started on Augmentin 08/05/14.  Mrs. Shidler is unsure if patient is having a fever; stated he was very clammy last night or having abdominal pain.  Advised her to make sure patient is well hydrated.  Pt is taking medication with food.  Will forward to PCP for further advise.  Clovis Pu, RN

## 2014-08-11 NOTE — Telephone Encounter (Signed)
Patient needs to return for BMET to check potassium. We've been trying to contact them to set this up.  If he is still having issues with diarrhea, I can switch the antibiotic.  Please contact to set up the lab appt for a BMET today or tomorrow.  I will switch antibiotic if he's having issues.  Thanks!

## 2014-08-11 NOTE — Telephone Encounter (Signed)
Mrs. Leja informed that antibiotic will be changed if patient is still having diarrhea per Dr. Gwendolyn Grant.  Mrs. Parlee stated she was not sure if patient had any episodes of diarrhea today.  Pt will let patient know to call clinic for lab appointment for a BMET.  Clovis Pu, RN

## 2014-08-11 NOTE — Telephone Encounter (Signed)
Left message for Mrs. Senna to return nurse call.  Clovis Pu, RN

## 2014-08-15 NOTE — Telephone Encounter (Signed)
Attempted to contact pt again to inform him of below. Juan [name], [name], [place]

## 2014-08-18 NOTE — Telephone Encounter (Signed)
4th and final attempt. Will forward to MD for next step. Fleeger, Maryjo Rochester

## 2014-08-18 NOTE — Telephone Encounter (Signed)
I am covering for Dr. Gwendolyn Grant who is away from the office.  I called pt's home number and there was no answer. As untreated hyperkalemia can be potentially very harmful, I deemed it apropriate to utilize pt's emergency contact in order to reach him. From review of prior telephone notes, pt's wife was previously informed about need to repeat potassium level. I then called pt's wife at work and was able to reach her.   As I am not personally familiar with this patient, I did not release any information to her other than to say that pt needed to come in for labwork. She recalled the need for repeat potassium level and said she had reminded him of this previously and that she will remind him again. I advised that he come in tomorrow to have this lab drawn. Wife understood these instructions.  Latrelle Dodrill, MD

## 2014-08-22 ENCOUNTER — Other Ambulatory Visit: Payer: No Typology Code available for payment source

## 2014-08-22 DIAGNOSIS — E875 Hyperkalemia: Secondary | ICD-10-CM

## 2014-08-22 LAB — BASIC METABOLIC PANEL
BUN: 15 mg/dL (ref 6–23)
CHLORIDE: 95 meq/L — AB (ref 96–112)
CO2: 25 meq/L (ref 19–32)
Calcium: 9.4 mg/dL (ref 8.4–10.5)
Creat: 1.1 mg/dL (ref 0.50–1.35)
Glucose, Bld: 111 mg/dL — ABNORMAL HIGH (ref 70–99)
Potassium: 5.3 mEq/L (ref 3.5–5.3)
SODIUM: 131 meq/L — AB (ref 135–145)

## 2014-08-22 NOTE — Progress Notes (Signed)
BMP DONE TODAY Juan [name] 

## 2014-08-24 NOTE — Telephone Encounter (Signed)
Attempted to call Mr. Wittig back.  Had to leave message.  Stated "labs okay" and asked him to call back.  I'll speak to him then.

## 2014-08-26 ENCOUNTER — Other Ambulatory Visit: Payer: Self-pay | Admitting: Family Medicine

## 2014-08-26 ENCOUNTER — Ambulatory Visit (INDEPENDENT_AMBULATORY_CARE_PROVIDER_SITE_OTHER): Payer: No Typology Code available for payment source | Admitting: Pharmacist

## 2014-08-26 ENCOUNTER — Encounter: Payer: Self-pay | Admitting: Pharmacist

## 2014-08-26 DIAGNOSIS — F17200 Nicotine dependence, unspecified, uncomplicated: Secondary | ICD-10-CM

## 2014-08-26 DIAGNOSIS — F17210 Nicotine dependence, cigarettes, uncomplicated: Secondary | ICD-10-CM | POA: Diagnosis not present

## 2014-08-26 MED ORDER — NICOTINE POLACRILEX 4 MG MT LOZG: 4.0000 mg | LOZENGE | OROMUCOSAL | Status: DC | PRN

## 2014-08-26 MED ORDER — NICOTINE 21 MG/24HR TD PT24: 21.0000 mg | MEDICATED_PATCH | Freq: Every day | TRANSDERMAL | Status: DC

## 2014-08-26 NOTE — Progress Notes (Signed)
S:  Patient arrives in a good mood. Patient arrives for evaluation/assistance with tobacco dependence.   Age when started using tobacco on a daily basis: 64yo. Number of Cigarettes per day: 25. Brand smoked Carlton Menthol. Estimated Nicotine Content per Cigarette (mg): 0.8-1mg .  Estimated Nicotine intake per day: 25mg .   Smokes first cigarette right after waking. Denies smoking during the night.  Estimated Fagerstrom Score: 8/10. Longest time ever been tobacco free: during hospitalization (several days) Medications used in past includes: nicotine patch. (only when in hospital and felt it did NOT completely control symptoms) Rates IMPORTANCE of quitting tobacco on 1-10 scale of 10. Rates CONFIDENCE of quitting tobacco on 1-10 scale of 5. Triggers to use tobacco include: anxiety, used to smoking first thing in the morning, smokes after eating meals.  O: BP 129/76 mmHg  Pulse 87  Ht 5' 6.5" (1.689 m)  Wt 190 lb (86.183 kg)  BMI 30.21 kg/m2  A/P: moderate Nicotine Dependence of ~40 years duration in a patient who is fair candidate for success b/c of current level of motivation.   Initiated combination nicotine replacement tx of nicotine patch (21mg ) and lozenge (4mg ). Patient counseled on purpose, proper use, and potential adverse effects.  Verbalized understanding and treatment plan.  Committed to smoking cessation tapering with quit day by the end of the month.  F/U phone call in 1 month.  F/U Rx Clinic Visit PRN.  Provided written information including "when I quit book and yellow card"   Total time in face-to-face counseling 50 minutes.  Patient seen with Charolette Child, PharmD Candidate.  Reports using Primatene tablets for congestion - readdress at next PCP visit.   Reports using Vitamin D supplement DAILY.   Dose in med list is 50,000 IU - clarify dose at next visit/contact.   I anticipate the dose is now 1000 or 2000 IU daily.

## 2014-08-26 NOTE — Patient Instructions (Addendum)
Thank you for visiting [place] today!  On Monday start using 21mg  nicotine patch daily (apply in the morning) in order to get a steady level of nicotine in your system and prevent anxiety with withdrawal. If the patch starts to fall off during the day use medical tape to hold it down.  On Monday start using 4mg  nicotine lozenges every 1-2 hours for cravings. Do not chew the lozenge or suck it continuously. Suck the lozenge until you taste it then hold it in the side of your mouth so the nicotine can be absorbed. Continue to do this until the lozenge is gone.  Find new habits to distract from smoking such as spending time on the computer playing scrabble, reading, or watching movies. Come up with a list of activities that will keep you busy and keep your mind off of smoking. Try to avoid smoking back to back. Become more aware of desire to smoke when smoking to help avoid smoking out of habit.  Expect a follow up phone call from Dr. Raymondo Band in month. Follow up with Dr. Gwendolyn Grant in about a month.

## 2014-08-26 NOTE — Progress Notes (Signed)
Patient ID: Juan [name], male   DOB: 1950/10/26, 64 y.o.   MRN: [id number] Reviewed: Agree with Dr. Macky Lower documentation and management.

## 2014-08-26 NOTE — Assessment & Plan Note (Signed)
moderate Nicotine Dependence of ~40 years duration in a patient who is fair candidate for success b/c of current level of motivation.   Initiated combination nicotine replacement tx of nicotine patch (21mg ) and lozenge (4mg ). Patient counseled on purpose, proper use, and potential adverse effects.  Verbalized understanding and treatment plan.  Committed to smoking cessation tapering with quit day by the end of the month.  F/U phone call in 1 month.  F/U Rx Clinic Visit PRN.  Provided written information including "when I quit book and yellow card"   Total time in face-to-face counseling 50 minutes.

## 2014-09-04 ENCOUNTER — Other Ambulatory Visit: Payer: Self-pay | Admitting: Internal Medicine

## 2014-09-05 NOTE — Telephone Encounter (Signed)
REFILL 

## 2014-09-21 ENCOUNTER — Telehealth: Payer: Self-pay | Admitting: *Deleted

## 2014-09-21 NOTE — Telephone Encounter (Signed)
Referral has already been sent to [facility]. They will contact patient with appt.

## 2014-09-21 NOTE — Addendum Note (Signed)
Addended byGwendolyn Grant, Newt Lukes on: 09/21/2014 03:11 PM   Modules accepted: Orders

## 2014-09-21 NOTE — Telephone Encounter (Signed)
Will forward to Tia.  Please refer to [employer] [facility] (not sure there are any other options).

## 2014-09-21 NOTE — Telephone Encounter (Signed)
Mrs. Querry called to check the status of referral for the pulmonologist.  She stated patient is using the nebulizer more than prescribed and concerned regarding his heart rate with the increase usage.  She was going to get a pulse ox monitor to check patient's oxygen levels.  She requested that the referral be placed to [facility].  Informed her once the referral was completed she should receive a call from the referral coordinator or Adolph Pollack for the appointment.  Please give her a call with questions at home number [phone] or work [phone].  Clovis Pu, RN

## 2014-09-22 ENCOUNTER — Telehealth: Payer: Self-pay | Admitting: Pharmacist

## 2014-09-22 NOTE — Telephone Encounter (Signed)
Left message

## 2014-10-14 ENCOUNTER — Other Ambulatory Visit: Payer: Self-pay | Admitting: Family Medicine

## 2014-10-16 ENCOUNTER — Other Ambulatory Visit: Payer: Self-pay | Admitting: Family Medicine

## 2014-10-19 ENCOUNTER — Other Ambulatory Visit: Payer: Self-pay | Admitting: Family Medicine

## 2014-11-08 ENCOUNTER — Emergency Department (HOSPITAL_COMMUNITY)
Admission: EM | Admit: 2014-11-08 | Discharge: 2014-11-09 | Disposition: A | Discharge status: Home / Self Care | Payer: Self-pay | Attending: Emergency Medicine | Admitting: Emergency Medicine

## 2014-11-08 ENCOUNTER — Emergency Department (HOSPITAL_COMMUNITY): Payer: Self-pay

## 2014-11-08 ENCOUNTER — Encounter (HOSPITAL_COMMUNITY): Payer: Self-pay | Admitting: Emergency Medicine

## 2014-11-08 DIAGNOSIS — J441 Chronic obstructive pulmonary disease with (acute) exacerbation: Secondary | ICD-10-CM | POA: Insufficient documentation

## 2014-11-08 DIAGNOSIS — Y92009 Unspecified place in unspecified non-institutional (private) residence as the place of occurrence of the external cause: Secondary | ICD-10-CM | POA: Insufficient documentation

## 2014-11-08 DIAGNOSIS — F419 Anxiety disorder, unspecified: Secondary | ICD-10-CM | POA: Insufficient documentation

## 2014-11-08 DIAGNOSIS — S29001A Unspecified injury of muscle and tendon of front wall of thorax, initial encounter: Secondary | ICD-10-CM | POA: Insufficient documentation

## 2014-11-08 DIAGNOSIS — Y998 Other external cause status: Secondary | ICD-10-CM | POA: Insufficient documentation

## 2014-11-08 DIAGNOSIS — S0033XA Contusion of nose, initial encounter: Secondary | ICD-10-CM | POA: Insufficient documentation

## 2014-11-08 DIAGNOSIS — R000 Tachycardia, unspecified: Secondary | ICD-10-CM | POA: Insufficient documentation

## 2014-11-08 DIAGNOSIS — G40909 Epilepsy, unspecified, not intractable, without status epilepticus: Secondary | ICD-10-CM | POA: Insufficient documentation

## 2014-11-08 DIAGNOSIS — W1839XA Other fall on same level, initial encounter: Secondary | ICD-10-CM | POA: Insufficient documentation

## 2014-11-08 DIAGNOSIS — W19XXXA Unspecified fall, initial encounter: Secondary | ICD-10-CM

## 2014-11-08 DIAGNOSIS — T148XXA Other injury of unspecified body region, initial encounter: Secondary | ICD-10-CM

## 2014-11-08 DIAGNOSIS — F329 Major depressive disorder, single episode, unspecified: Secondary | ICD-10-CM | POA: Insufficient documentation

## 2014-11-08 DIAGNOSIS — Z79899 Other long term (current) drug therapy: Secondary | ICD-10-CM | POA: Insufficient documentation

## 2014-11-08 DIAGNOSIS — I5023 Acute on chronic systolic (congestive) heart failure: Secondary | ICD-10-CM | POA: Insufficient documentation

## 2014-11-08 DIAGNOSIS — F1020 Alcohol dependence, uncomplicated: Secondary | ICD-10-CM | POA: Insufficient documentation

## 2014-11-08 DIAGNOSIS — Y9389 Activity, other specified: Secondary | ICD-10-CM | POA: Insufficient documentation

## 2014-11-08 DIAGNOSIS — I10 Essential (primary) hypertension: Secondary | ICD-10-CM | POA: Insufficient documentation

## 2014-11-08 DIAGNOSIS — Z7982 Long term (current) use of aspirin: Secondary | ICD-10-CM | POA: Insufficient documentation

## 2014-11-08 DIAGNOSIS — E785 Hyperlipidemia, unspecified: Secondary | ICD-10-CM | POA: Insufficient documentation

## 2014-11-08 DIAGNOSIS — Z720 Tobacco use: Secondary | ICD-10-CM | POA: Insufficient documentation

## 2014-11-08 LAB — CBC WITH DIFFERENTIAL/PLATELET
Basophils Absolute: 0.1 10*3/uL (ref 0.0–0.1)
Basophils Relative: 2 %
EOS PCT: 5 %
Eosinophils Absolute: 0.3 10*3/uL (ref 0.0–0.7)
HCT: 41.7 % (ref 39.0–52.0)
Hemoglobin: 13.9 g/dL (ref 13.0–17.0)
LYMPHS ABS: 1.2 10*3/uL (ref 0.7–4.0)
LYMPHS PCT: 25 %
MCH: 31.3 pg (ref 26.0–34.0)
MCHC: 33.3 g/dL (ref 30.0–36.0)
MCV: 93.9 fL (ref 78.0–100.0)
MONO ABS: 0.5 10*3/uL (ref 0.1–1.0)
Monocytes Relative: 11 %
Neutro Abs: 2.8 10*3/uL (ref 1.7–7.7)
Neutrophils Relative %: 57 %
Platelets: 167 10*3/uL (ref 150–400)
RBC: 4.44 MIL/uL (ref 4.22–5.81)
RDW: 15.9 % — AB (ref 11.5–15.5)
WBC: 4.9 10*3/uL (ref 4.0–10.5)

## 2014-11-08 LAB — COMPREHENSIVE METABOLIC PANEL
ALT: 62 U/L (ref 17–63)
AST: 117 U/L — ABNORMAL HIGH (ref 15–41)
Albumin: 3.6 g/dL (ref 3.5–5.0)
Alkaline Phosphatase: 159 U/L — ABNORMAL HIGH (ref 38–126)
Anion gap: 12 (ref 5–15)
BUN: <5 mg/dL — ABNORMAL LOW (ref 6–20)
CHLORIDE: 102 mmol/L (ref 101–111)
CO2: 22 mmol/L (ref 22–32)
CREATININE: 0.86 mg/dL (ref 0.61–1.24)
Calcium: 8.4 mg/dL — ABNORMAL LOW (ref 8.9–10.3)
GFR calc Af Amer: >60 mL/min (ref >60)
Glucose, Bld: 143 mg/dL — ABNORMAL HIGH (ref 65–99)
Potassium: 4.3 mmol/L (ref 3.5–5.1)
Sodium: 136 mmol/L (ref 135–145)
TOTAL PROTEIN: 7.1 g/dL (ref 6.5–8.1)
Total Bilirubin: 0.5 mg/dL (ref 0.3–1.2)

## 2014-11-08 NOTE — ED Notes (Signed)
Spouse reported that pt. tripped and fell at home last Friday , no LOC / ambulatory , reports bruise at nose and upper lip swelling , denies pain , alert and oriented / respirations unlabored .

## 2014-11-09 ENCOUNTER — Emergency Department (HOSPITAL_COMMUNITY): Payer: No Typology Code available for payment source

## 2014-11-09 MED ORDER — PREDNISONE 20 MG PO TABS
60.0000 mg | ORAL_TABLET | Freq: Once | ORAL | Status: AC
  Administered 2014-11-09: 60 mg via ORAL
  Filled 2014-11-09: qty 3

## 2014-11-09 MED ORDER — IPRATROPIUM BROMIDE 0.02 % IN SOLN
0.5000 mg | Freq: Once | RESPIRATORY_TRACT | Status: AC
  Administered 2014-11-09: 0.5 mg via RESPIRATORY_TRACT
  Filled 2014-11-09: qty 2.5

## 2014-11-09 MED ORDER — PREDNISONE 10 MG PO TABS: 60.0000 mg | ORAL_TABLET | Freq: Every day | ORAL | Status: DC

## 2014-11-09 MED ORDER — FUROSEMIDE 10 MG/ML IJ SOLN
80.0000 mg | Freq: Once | INTRAMUSCULAR | Status: AC
  Administered 2014-11-09: 80 mg via INTRAMUSCULAR
  Filled 2014-11-09: qty 8

## 2014-11-09 MED ORDER — ALBUTEROL (5 MG/ML) CONTINUOUS INHALATION SOLN
10.0000 mg/h | INHALATION_SOLUTION | Freq: Once | RESPIRATORY_TRACT | Status: AC
  Administered 2014-11-09: 10 mg/h via RESPIRATORY_TRACT
  Filled 2014-11-09: qty 20

## 2014-11-09 NOTE — ED Notes (Signed)
Pt left with all belongings and was wheeled out of the treatment area with his wife.

## 2014-11-09 NOTE — ED Notes (Signed)
Patient returned to room from x-ray.

## 2014-11-09 NOTE — Discharge Instructions (Signed)
You were wheezing in the ER - and this could be combination of both COPD and heart failure. No infection in the lungs seen. Take the prednisone and make sure you take your heart pills.  Please return to the ER if you have worsening chest pain, shortness of breath, pain radiating to your jaw, shoulder, or back, sweats or fainting. Otherwise see the Cardiologist or your primary care doctor as requested.   Chronic Obstructive Pulmonary Disease Exacerbation Chronic obstructive pulmonary disease (COPD) is a common lung condition in which airflow from the lungs is limited. COPD is a general term that can be used to describe many different lung problems that limit airflow, including chronic bronchitis and emphysema. COPD exacerbations are episodes when breathing symptoms become much worse and require extra treatment. Without treatment, COPD exacerbations can be life threatening, and frequent COPD exacerbations can cause further damage to your lungs. CAUSES  Respiratory infections.  Exposure to smoke.  Exposure to air pollution, chemical fumes, or dust. Sometimes there is no apparent cause or trigger. RISK FACTORS  Smoking cigarettes.  Older age.  Frequent prior COPD exacerbations. SIGNS AND SYMPTOMS  Increased coughing.  Increased thick spit (sputum) production.  Increased wheezing.  Increased shortness of breath.  Rapid breathing.  Chest tightness. DIAGNOSIS Your medical history, a physical exam, and tests will help your health care provider make a diagnosis. Tests may include:  A chest X-ray.  Basic lab tests.  Sputum testing.  An arterial blood gas test. TREATMENT Depending on the severity of your COPD exacerbation, you may need to be admitted to a hospital for treatment. Some of the treatments commonly used to treat COPD exacerbations are:   Antibiotic medicines.  Bronchodilators. These are drugs that expand the air passages. They may be given with an inhaler or  nebulizer. Spacer devices may be needed to help improve drug delivery.  Corticosteroid medicines.  Supplemental oxygen therapy.  Airway clearing techniques, such as noninvasive ventilation (NIV) and positive expiratory pressure (PEP). These provide respiratory support through a mask or other noninvasive device. HOME CARE INSTRUCTIONS  Do not smoke. Quitting smoking is very important to prevent COPD from getting worse and exacerbations from happening as often.  Avoid exposure to all substances that irritate the airway, especially to tobacco smoke.  If you were prescribed an antibiotic medicine, finish it all even if you start to feel better.  Take all medicines as directed by your health care provider.It is important to use correct technique with inhaled medicines.  Drink enough fluids to keep your urine clear or pale yellow (unless you have a medical condition that requires fluid restriction).  Use a cool mist vaporizer. This makes it easier to clear your chest when you cough.  If you have a home nebulizer and oxygen, continue to use them as directed.  Maintain all necessary vaccinations to prevent infections.  Exercise regularly.  Eat a healthy diet.  Keep all follow-up appointments as directed by your health care provider. SEEK IMMEDIATE MEDICAL CARE IF:  You have worsening shortness of breath.  You have trouble talking.  You have severe chest pain.  You have blood in your sputum.  You have a fever.  You have weakness, vomit repeatedly, or faint.  You feel confused.  You continue to get worse. MAKE SURE YOU:  Understand these instructions.  Will watch your condition.  Will get help right away if you are not doing well or get worse.   This information is not intended to replace advice  given to you by your health care provider. Make sure you discuss any questions you have with your health care provider.   Document Released: 11/18/2006 Document Revised:  02/11/2014 Document Reviewed: 09/25/2012 Elsevier Interactive Patient Education 2016 [employer]  Heart Failure Heart failure is a condition in which the heart has trouble pumping blood. This means your heart does not pump blood efficiently for your body to work well. In some cases of heart failure, fluid may back up into your lungs or you may have swelling (edema) in your lower legs. Heart failure is usually a long-term (chronic) condition. It is important for you to take good care of yourself and follow your health care provider's treatment plan. CAUSES  Some health conditions can cause heart failure. Those health conditions include:  High blood pressure (hypertension). Hypertension causes the heart muscle to work harder than normal. When pressure in the blood vessels is high, the heart needs to pump (contract) with more force in order to circulate blood throughout the body. High blood pressure eventually causes the heart to become stiff and weak.  Coronary artery disease (CAD). CAD is the buildup of cholesterol and fat (plaque) in the arteries of the heart. The blockage in the arteries deprives the heart muscle of oxygen and blood. This can cause chest pain and may lead to a heart attack. High blood pressure can also contribute to CAD.  Heart attack (myocardial infarction). A heart attack occurs when one or more arteries in the heart become blocked. The loss of oxygen damages the muscle tissue of the heart. When this happens, part of the heart muscle dies. The injured tissue does not contract as well and weakens the heart's ability to pump blood.  Abnormal heart valves. When the heart valves do not open and close properly, it can cause heart failure. This makes the heart muscle pump harder to keep the blood flowing.  Heart muscle disease (cardiomyopathy or myocarditis). Heart muscle disease is damage to the heart muscle from a variety of causes. These can include drug or alcohol abuse,  infections, or unknown reasons. These can increase the risk of heart failure.  Lung disease. Lung disease makes the heart work harder because the lungs do not work properly. This can cause a strain on the heart, leading it to fail.  Diabetes. Diabetes increases the risk of heart failure. High blood sugar contributes to high fat (lipid) levels in the blood. Diabetes can also cause slow damage to tiny blood vessels that carry important nutrients to the heart muscle. When the heart does not get enough oxygen and food, it can cause the heart to become weak and stiff. This leads to a heart that does not contract efficiently.  Other conditions can contribute to heart failure. These include abnormal heart rhythms, thyroid problems, and low blood counts (anemia). Certain unhealthy behaviors can increase the risk of heart failure, including:  Being overweight.  Smoking or chewing tobacco.  Eating foods high in fat and cholesterol.  Abusing illicit drugs or alcohol.  Lacking physical activity. SYMPTOMS  Heart failure symptoms may vary and can be hard to detect. Symptoms may include:  Shortness of breath with activity, such as climbing stairs.  Persistent cough.  Swelling of the feet, ankles, legs, or abdomen.  Unexplained weight gain.  Difficulty breathing when lying flat (orthopnea).  Waking from sleep because of the need to sit up and get more air.  Rapid heartbeat.  Fatigue and loss of energy.  Feeling light-headed, dizzy, or close  to fainting.  Loss of appetite.  Nausea.  Increased urination during the night (nocturia). DIAGNOSIS  A diagnosis of heart failure is based on your history, symptoms, physical examination, and diagnostic tests. Diagnostic tests for heart failure may include:  Echocardiography.  Electrocardiography.  Chest X-ray.  Blood tests.  Exercise stress test.  Cardiac angiography.  Radionuclide scans. TREATMENT  Treatment is aimed at managing the  symptoms of heart failure. Medicines, behavioral changes, or surgical intervention may be necessary to treat heart failure.  Medicines to help treat heart failure may include:  Angiotensin-converting enzyme (ACE) inhibitors. This type of medicine blocks the effects of a blood protein called angiotensin-converting enzyme. ACE inhibitors relax (dilate) the blood vessels and help lower blood pressure.  Angiotensin receptor blockers (ARBs). This type of medicine blocks the actions of a blood protein called angiotensin. Angiotensin receptor blockers dilate the blood vessels and help lower blood pressure.  Water pills (diuretics). Diuretics cause the kidneys to remove salt and water from the blood. The extra fluid is removed through urination. This loss of extra fluid lowers the volume of blood the heart pumps.  Beta blockers. These prevent the heart from beating too fast and improve heart muscle strength.  Digitalis. This increases the force of the heartbeat.  Healthy behavior changes include:  Obtaining and maintaining a healthy weight.  Stopping smoking or chewing tobacco.  Eating heart-healthy foods.  Limiting or avoiding alcohol.  Stopping illicit drug use.  Physical activity as directed by your health care provider.  Surgical treatment for heart failure may include:  A procedure to open blocked arteries, repair damaged heart valves, or remove damaged heart muscle tissue.  A pacemaker to improve heart muscle function and control certain abnormal heart rhythms.  An internal cardioverter defibrillator to treat certain serious abnormal heart rhythms.  A left ventricular assist device (LVAD) to assist the pumping ability of the heart. HOME CARE INSTRUCTIONS   Take medicines only as directed by your health care provider. Medicines are important in reducing the workload of your heart, slowing the progression of heart failure, and improving your symptoms.  Do not stop taking your  medicine unless directed by your health care provider.  Do not skip any dose of medicine.  Refill your prescriptions before you run out of medicine. Your medicines are needed every day.  Engage in moderate physical activity if directed by your health care provider. Moderate physical activity can benefit some people. The elderly and people with severe heart failure should consult with a health care provider for physical activity recommendations.  Eat heart-healthy foods. Food choices should be free of trans fat and low in saturated fat, cholesterol, and salt (sodium). Healthy choices include fresh or frozen fruits and vegetables, fish, lean meats, legumes, fat-free or low-fat dairy products, and whole grain or high fiber foods. Talk to a dietitian to learn more about heart-healthy foods.  Limit sodium if directed by your health care provider. Sodium restriction may reduce symptoms of heart failure in some people. Talk to a dietitian to learn more about heart-healthy seasonings.  Use healthy cooking methods. Healthy cooking methods include roasting, grilling, broiling, baking, poaching, steaming, or stir-frying. Talk to a dietitian to learn more about healthy cooking methods.  Limit fluids if directed by your health care provider. Fluid restriction may reduce symptoms of heart failure in some people.  Weigh yourself every day. Daily weights are important in the early recognition of excess fluid. You should weigh yourself every morning after you urinate and  before you eat breakfast. Wear the same amount of clothing each time you weigh yourself. Record your daily weight. Provide your health care provider with your weight record.  Monitor and record your blood pressure if directed by your health care provider.  Check your pulse if directed by your health care provider.  Lose weight if directed by your health care provider. Weight loss may reduce symptoms of heart failure in some people.  Stop  smoking or chewing tobacco. Nicotine makes your heart work harder by causing your blood vessels to constrict. Do not use nicotine gum or patches before talking to your health care provider.  Keep all follow-up visits as directed by your health care provider. This is important.  Limit alcohol intake to no more than 1 drink per day for nonpregnant women and 2 drinks per day for men. One drink equals 12 ounces of beer, 5 ounces of wine, or 1 ounces of hard liquor. Drinking more than that is harmful to your heart. Tell your health care provider if you drink alcohol several times a week. Talk with your health care provider about whether alcohol is safe for you. If your heart has already been damaged by alcohol or you have severe heart failure, drinking alcohol should be stopped completely.  Stop illicit drug use.  Stay up-to-date with immunizations. It is especially important to prevent respiratory infections through current pneumococcal and influenza immunizations.  Manage other health conditions such as hypertension, diabetes, thyroid disease, or abnormal heart rhythms as directed by your health care provider.  Learn to manage stress.  Plan rest periods when fatigued.  Learn strategies to manage high temperatures. If the weather is extremely hot:  Avoid vigorous physical activity.  Use air conditioning or fans or seek a cooler location.  Avoid caffeine and alcohol.  Wear loose-fitting, lightweight, and light-colored clothing.  Learn strategies to manage cold temperatures. If the weather is extremely cold:  Avoid vigorous physical activity.  Layer clothes.  Wear mittens or gloves, a hat, and a scarf when going outside.  Avoid alcohol.  Obtain ongoing education and support as needed.  Participate in or seek rehabilitation as needed to maintain or improve independence and quality of life. SEEK MEDICAL CARE IF:   You have a rapid weight gain.  You have increasing shortness of  breath that is unusual for you.  You are unable to participate in your usual physical activities.  You tire easily.  You cough more than normal, especially with physical activity.  You have any or more swelling in areas such as your hands, feet, ankles, or abdomen.  You are unable to sleep because it is hard to breathe.  You feel like your heart is beating fast (palpitations).  You become dizzy or light-headed upon standing up. SEEK IMMEDIATE MEDICAL CARE IF:   You have difficulty breathing.  There is a change in mental status such as decreased alertness or difficulty with concentration.  You have a pain or discomfort in your chest.  You have an episode of fainting (syncope). MAKE SURE YOU:   Understand these instructions.  Will watch your condition.  Will get help right away if you are not doing well or get worse.   This information is not intended to replace advice given to you by your health care provider. Make sure you discuss any questions you have with your health care provider.   Document Released: 01/21/2005 Document Revised: 06/07/2014 Document Reviewed: 02/21/2012 [employer] Interactive Patient Education [employer]

## 2014-11-09 NOTE — ED Notes (Signed)
Patient transported to X-ray via stretcher 

## 2014-11-09 NOTE — ED Provider Notes (Signed)
CSN: [id number]     Arrival date & time 11/08/14  2212 History  By signing my name below, I, Tanda Rockers, attest that this documentation has been prepared under the direction and in the presence of Derwood Kaplan, MD. Electronically Signed: Tanda Rockers, ED Scribe. 11/09/2014. 2:12 AM.  Chief Complaint  Patient presents with  . Fall   The history is provided by the patient and the spouse. No language interpreter was used.     HPI Comments: Juan [name] is a 64 y.o. male with hx CHF, COPD, HLD, and HTN who presents to the Emergency Department for alcohol abuse. Wife reports that pt has been having more frequent falls recently due to drinking. She admits to supplying patient with alcohol because she is afraid of alcohol withdrawals. She notes that pt's most frequent fall was approximately 5 days ago with bruising to nasal bridge. She brought him to the ED tonight because she is concerned about his falls. Pt has been drinking heavy for the past 4 years. Wife states that pt used to be a functional alcoholic but is not that way any longer. Pt recently lost his job that he had for 23 years. The last time pt drank was earlier this afternoon. He usually drinks bourbon and coke. Pt states he usually drinks 5-6 cans of coke per day with a large bottle of bourbon every 2 days. Pt does not know if he wants to quit drinking. He is not concerned with withdrawing at this moment. Pt is currently complaining of COPD exacerbation with chest tightness. Pt denies pain, cough, shortness of breath, wheezing, abdominal pain, bleeding issues, or any other associated symptoms.    Past Medical History  Diagnosis Date  . CHF (congestive heart failure) (HCC) 10/2010    ECHO:  EF 40%, Grade II diastolic dysfunction  . Alcoholism (HCC)   . Allergy     seasonal   . Anxiety   . Depression   . Asthma   . Hyperlipidemia   . Hypertension   . Seizures (HCC)   . COPD (chronic obstructive pulmonary disease) [facility])     Past Surgical History  Procedure Laterality Date  . Toenail excision Right 1967    Removal of ingrown toenail [Other]   Family History  Problem Relation Age of Onset  . Alzheimer's disease Mother   . Diabetes Mother   . Heart disease Maternal Uncle    Social History  Substance Use Topics  . Smoking status: Current Every Day Smoker -- 1.25 packs/day for 40 years    Types: Cigarettes  . Smokeless tobacco: Never Used     Comment: a little more than 1 ppd (02/17/14)  . Alcohol Use: Yes     Comment: )    Review of Systems  Constitutional: Negative for fever and chills.  Respiratory: Positive for chest tightness. Negative for cough, shortness of breath and wheezing.   Gastrointestinal: Positive for nausea. Negative for abdominal pain.  Musculoskeletal: Negative for arthralgias.  Hematological: Does not bruise/bleed easily.   Allergies  Review of patient's allergies indicates no known allergies.  Home Medications   Prior to Admission medications   Medication Sig Start Date End Date Taking? Authorizing Provider  acetaminophen (TYLENOL) 500 MG tablet Take 1,500 mg by mouth every 6 (six) hours as needed for headache (headache).     Historical Provider, MD  albuterol (PROVENTIL) (2.5 MG/3ML) 0.083% nebulizer solution USE 3 MLS BY NEBULIZATION EVERY 6 HOURS AS NEEDED FOR WHEEZING OR SHORTNESS OF BREATH  10/20/14   Tobey Grim, MD  aspirin EC 81 MG EC tablet Take 1 tablet (81 mg total) by mouth daily. 11/13/12   Renae Fickle, MD  atorvastatin (LIPITOR) 40 MG tablet Take 1 tablet (40 mg total) by mouth daily at 6 PM. 03/17/14   Chrystie Nose, MD  B-Complex CAPS Take 1 capsule by mouth daily.    Historical Provider, MD  carvedilol (COREG) 25 MG tablet Take 1 tablet (25 mg total) by mouth 2 (two) times daily with a meal. NEED OV. 09/05/14   Chrystie Nose, MD  clonazePAM (KLONOPIN) 0.5 MG tablet TAKE 1 TABLET BY MOUTH TWICE A DAY AS NEEDED FOR ANXIETY 10/17/14   Tobey Grim, MD   COMBIVENT RESPIMAT 20-100 MCG/ACT AERS respimat INHALE TWO PUFFS BY MOUTH EVERY FOUR HOURS AS NEEDED FOR WHEEZING 10/14/14   Tobey Grim, MD  DULoxetine (CYMBALTA) 30 MG capsule TAKE ONE CAPSULE BY MOUTH ONE TIME DAILY 07/27/14   Uvaldo Rising, MD  ePHEDrine-GuaiFENesin (PRIMATENE ASTHMA) 12.5-200 MG TABS Take 25 mg by mouth.    Historical Provider, MD  fluticasone Aleda Grana) 50 MCG/ACT nasal spray Use two sprays in each nostril daily 11/08/13   Tobey Grim, MD  furosemide (LASIX) 40 MG tablet Take 1 tablet (40 mg total) by mouth daily. 03/17/14   Chrystie Nose, MD  gabapentin (NEURONTIN) 300 MG capsule TAKE ONE CAPSULE BY MOUTH THREE TIMES DAILY  Patient not taking: Reported on 06/24/2014 03/17/14   Tobey Grim, MD  guaiFENesin (MUCINEX) 600 MG 12 hr tablet Take 2 tablets (1,200 mg total) by mouth 2 (two) times daily. 07/20/14   Ashly Hulen Skains, DO  lisinopril (PRINIVIL,ZESTRIL) 5 MG tablet TAKE 1 TABLET BY MOUTH EVERY DAY 08/05/14   Tobey Grim, MD  Magnesium 500 MG TABS Take 500 mg by mouth daily.    Historical Provider, MD  montelukast (SINGULAIR) 10 MG tablet TAKE ONE TABLET BY MOUTH NIGHTLY AT BEDTIME 10/17/14   Tobey Grim, MD  Multiple Vitamin (MULTIVITAMIN WITH MINERALS) TABS tablet Take 1 tablet by mouth daily.    Historical Provider, MD  nicotine (NICODERM CQ - DOSED IN MG/24 HOURS) 21 mg/24hr patch Place 1 patch (21 mg total) onto the skin daily. 08/26/14   Moses Manners, MD  nicotine polacrilex (COMMIT) 4 MG lozenge Take 1 lozenge (4 mg total) by mouth as needed for smoking cessation. 08/26/14   Moses Manners, MD  potassium chloride SA (K-DUR,KLOR-CON) 20 MEQ tablet Take 2 tablets (40 mEq total) by mouth daily. Patient not taking: Reported on 08/26/2014 03/17/14   Chrystie Nose, MD  predniSONE (DELTASONE) 10 MG tablet Take 6 tablets (60 mg total) by mouth daily. 11/09/14   Derwood Kaplan, MD  sodium chloride 1 G tablet Take 1 g by mouth 2 (two) times daily with a  meal.    Historical Provider, MD  spironolactone (ALDACTONE) 25 MG tablet TAKE HALF TABLET BY MOUTH DAILY  03/17/14   Chrystie Nose, MD  tiotropium (SPIRIVA) 18 MCG inhalation capsule Place 1 capsule (18 mcg total) into inhaler and inhale daily. 03/16/14   Tobey Grim, MD  triamcinolone (KENALOG) 0.025 % ointment Apply 1 application topically 2 (two) times daily. Patient taking differently: Apply 1 application topically 2 (two) times daily as needed (for rash).  10/26/13   Tobey Grim, MD  Vitamin D, Ergocalciferol, (DRISDOL) 50000 UNITS CAPS capsule TAKE 1 CAPSULE BY MOUTH EVERY 7 DAYS. 06/06/14  Tobey Grim, MD   Triage Vitals: BP 150/95 mmHg  Pulse 104  Temp(Src) 98.3 F (36.8 C) (Oral)  Resp 16  SpO2 94%   Physical Exam  Constitutional: He is oriented to person, place, and time. He appears well-developed and well-nourished. No distress.  HENT:  Head: Normocephalic.  No active bleeding from the scalp or the head Pt has ecchymosis to the nose   Eyes: Conjunctivae and EOM are normal. Pupils are equal, round, and reactive to light.  Pupils are 3 mm and equal Sclera is clear  Neck: Neck supple. No tracheal deviation present.  No midline C spine tenderness  Cardiovascular: Regular rhythm.  Tachycardia present.   Pulmonary/Chest: Effort normal. No respiratory distress. He has wheezes. He has no rhonchi. He has no rales.  End expiratory wheezing diffusely with proper air movement  Abdominal: Soft.  Pt has ventral hernia 15 cm in diameter   Musculoskeletal: Normal range of motion.  Neurological: He is alert and oriented to person, place, and time.  Skin: Skin is warm and dry.  Psychiatric: He has a normal mood and affect. His behavior is normal.  Nursing note and vitals reviewed.   ED Course  Procedures (including critical care time)  DIAGNOSTIC STUDIES: Oxygen Saturation is 94% on RA, adequate by my interpretation.    COORDINATION OF CARE: 2:06 AM-Discussed  treatment plan with pt at bedside and pt agreed to plan.   Labs Review Labs Reviewed  CBC WITH DIFFERENTIAL/PLATELET - Abnormal; Notable for the following:    RDW 15.9 (*)    All other components within normal limits  COMPREHENSIVE METABOLIC PANEL - Abnormal; Notable for the following:    Glucose, Bld 143 (*)    BUN <5 (*)    Calcium 8.4 (*)    AST 117 (*)    Alkaline Phosphatase 159 (*)    All other components within normal limits    Imaging Review Dg Chest 1 View  11/09/2014   CLINICAL DATA:  Acute onset of shortness of breath. Initial encounter.  EXAM: CHEST 1 VIEW  COMPARISON:  Chest radiograph performed 07/18/2014  FINDINGS: The lungs are well-aerated. Mild vascular congestion is noted. Mild left basilar atelectasis is seen. There is no evidence of pleural effusion or pneumothorax.  The cardiomediastinal silhouette is within normal limits. No acute osseous abnormalities are seen.  IMPRESSION: Mild vascular congestion noted.  Mild left basilar atelectasis seen.   Electronically Signed   By: Roanna Raider M.D.   On: 11/09/2014 02:52   Ct Head Wo Contrast  11/09/2014   CLINICAL DATA:  Tripped and fell at home, with bruising at the nose and upper lip swelling. Initial encounter.  EXAM: CT HEAD WITHOUT CONTRAST  CT MAXILLOFACIAL WITHOUT CONTRAST  TECHNIQUE: Multidetector CT imaging of the head and maxillofacial structures were performed using the standard protocol without intravenous contrast. Multiplanar CT image reconstructions of the maxillofacial structures were also generated.  COMPARISON:  CT of the head performed 04/15/2014  FINDINGS: CT HEAD FINDINGS  There is no evidence of acute infarction, mass lesion, or intra- or extra-axial hemorrhage on CT.  The posterior fossa, including the cerebellum, brainstem and fourth ventricle, is within normal limits. The third and lateral ventricles, and basal ganglia are unremarkable in appearance. The cerebral hemispheres are symmetric in appearance,  with normal gray-white differentiation. No mass effect or midline shift is seen.  There is no evidence of fracture; visualized osseous structures are unremarkable in appearance. The visualized portions of the orbits are  within normal limits. Mucoperiosteal thickening is noted at the left maxillary sinus, and mild mucosal thickening is seen at the right maxillary sinus. The remaining paranasal sinuses and mastoid air cells are well-aerated. No significant soft tissue abnormalities are seen.  CT MAXILLOFACIAL FINDINGS  There is no evidence of fracture or dislocation. The maxilla and mandible appear intact. The nasal bone is unremarkable in appearance. Numerous maxillary and mandibular dental caries are noted.  The orbits are intact bilaterally. Mucoperiosteal thickening is noted at the left maxillary sinus, and mild mucosal thickening is seen at the right maxillary sinus. The remaining visualized paranasal sinuses and mastoid air cells are well-aerated.  Calcification is noted at the carotid bifurcations bilaterally. The parapharyngeal fat planes are preserved. The nasopharynx, oropharynx and hypopharynx are unremarkable in appearance. The visualized portions of the valleculae and piriform sinuses are grossly unremarkable.  The parotid and submandibular glands are within normal limits. No cervical lymphadenopathy is seen.  IMPRESSION: 1. No evidence of traumatic intracranial injury or fracture. 2. No evidence of fracture or dislocation with regard to the maxillofacial structures. 3. Numerous maxillary and mandibular dental caries noted. 4. Mucoperiosteal thickening at the left maxillary sinus, and mild mucosal thickening at the right maxillary sinus. 5. Calcification at the carotid bifurcations bilaterally. Carotid ultrasound would be helpful for further evaluation, when and as deemed clinically appropriate.   Electronically Signed   By: Roanna Raider M.D.   On: 11/09/2014 00:54   Ct Maxillofacial Wo  Cm  11/09/2014   CLINICAL DATA:  Tripped and fell at home, with bruising at the nose and upper lip swelling. Initial encounter.  EXAM: CT HEAD WITHOUT CONTRAST  CT MAXILLOFACIAL WITHOUT CONTRAST  TECHNIQUE: Multidetector CT imaging of the head and maxillofacial structures were performed using the standard protocol without intravenous contrast. Multiplanar CT image reconstructions of the maxillofacial structures were also generated.  COMPARISON:  CT of the head performed 04/15/2014  FINDINGS: CT HEAD FINDINGS  There is no evidence of acute infarction, mass lesion, or intra- or extra-axial hemorrhage on CT.  The posterior fossa, including the cerebellum, brainstem and fourth ventricle, is within normal limits. The third and lateral ventricles, and basal ganglia are unremarkable in appearance. The cerebral hemispheres are symmetric in appearance, with normal gray-white differentiation. No mass effect or midline shift is seen.  There is no evidence of fracture; visualized osseous structures are unremarkable in appearance. The visualized portions of the orbits are within normal limits. Mucoperiosteal thickening is noted at the left maxillary sinus, and mild mucosal thickening is seen at the right maxillary sinus. The remaining paranasal sinuses and mastoid air cells are well-aerated. No significant soft tissue abnormalities are seen.  CT MAXILLOFACIAL FINDINGS  There is no evidence of fracture or dislocation. The maxilla and mandible appear intact. The nasal bone is unremarkable in appearance. Numerous maxillary and mandibular dental caries are noted.  The orbits are intact bilaterally. Mucoperiosteal thickening is noted at the left maxillary sinus, and mild mucosal thickening is seen at the right maxillary sinus. The remaining visualized paranasal sinuses and mastoid air cells are well-aerated.  Calcification is noted at the carotid bifurcations bilaterally. The parapharyngeal fat planes are preserved. The nasopharynx,  oropharynx and hypopharynx are unremarkable in appearance. The visualized portions of the valleculae and piriform sinuses are grossly unremarkable.  The parotid and submandibular glands are within normal limits. No cervical lymphadenopathy is seen.  IMPRESSION: 1. No evidence of traumatic intracranial injury or fracture. 2. No evidence of fracture or dislocation with  regard to the maxillofacial structures. 3. Numerous maxillary and mandibular dental caries noted. 4. Mucoperiosteal thickening at the left maxillary sinus, and mild mucosal thickening at the right maxillary sinus. 5. Calcification at the carotid bifurcations bilaterally. Carotid ultrasound would be helpful for further evaluation, when and as deemed clinically appropriate.   Electronically Signed   By: Roanna Raider M.D.   On: 11/09/2014 00:54   I have personally reviewed and evaluated these images and lab results as part of my medical decision-making.   EKG Interpretation None      MDM   Final diagnoses:  Alcoholism (HCC)  COPD exacerbation (HCC)  Acute on chronic systolic congestive heart failure (HCC)  Fall, initial encounter  Contusion    I personally performed the services described in this documentation, which was scribed in my presence. The recorded information has been reviewed and is accurate.  Pt comes in with cc of fall. Pt is alcoholic, and he is admitting to it. He is not ready to quit. Wife is concerned about the alcoholism and the potential for severe hearm from the falls and the alcohol itself. Pt doesn't want to stop drinking, he is showing good judgement, i explained to her that if pt is not willing to stop alcohol use, all of our attempts will be futile. She understands the rational behind the thought.  Pt's imaging are neg. Pt has some wheezing. CXR shows mild congestion. Could be a bit of both COPD and CHF. He is tachycardic - likely from the alcohol withdrawal - but he has no hard signs of severe  withdrawals right now. Labs are reassuring. There is no hypoxia. Will give a breathing tx and im lasix. Pt and wife advised to ensure Cardiac meds are taken as prescribed.  In this setting, i think the longer we keep him in the ER, the more harm we will do to the patient.     Derwood Kaplan, MD 11/09/14 [phone]

## 2014-11-14 ENCOUNTER — Inpatient Hospital Stay (HOSPITAL_COMMUNITY)
Admission: EM | Admit: 2014-11-14 | Discharge: 2014-11-22 | DRG: 871 | Disposition: A | Discharge status: Home / Self Care | Payer: Managed Care, Other (non HMO) | Attending: Internal Medicine | Admitting: Internal Medicine

## 2014-11-14 ENCOUNTER — Emergency Department (HOSPITAL_COMMUNITY): Payer: Managed Care, Other (non HMO)

## 2014-11-14 ENCOUNTER — Encounter (HOSPITAL_COMMUNITY): Payer: Self-pay | Admitting: Emergency Medicine

## 2014-11-14 ENCOUNTER — Inpatient Hospital Stay (HOSPITAL_COMMUNITY): Payer: Managed Care, Other (non HMO)

## 2014-11-14 DIAGNOSIS — Z8249 Family history of ischemic heart disease and other diseases of the circulatory system: Secondary | ICD-10-CM | POA: Diagnosis not present

## 2014-11-14 DIAGNOSIS — I110 Hypertensive heart disease with heart failure: Secondary | ICD-10-CM | POA: Diagnosis present

## 2014-11-14 DIAGNOSIS — Z7982 Long term (current) use of aspirin: Secondary | ICD-10-CM | POA: Diagnosis not present

## 2014-11-14 DIAGNOSIS — F10239 Alcohol dependence with withdrawal, unspecified: Secondary | ICD-10-CM | POA: Diagnosis present

## 2014-11-14 DIAGNOSIS — E876 Hypokalemia: Secondary | ICD-10-CM | POA: Diagnosis not present

## 2014-11-14 DIAGNOSIS — E860 Dehydration: Secondary | ICD-10-CM | POA: Diagnosis present

## 2014-11-14 DIAGNOSIS — W19XXXA Unspecified fall, initial encounter: Secondary | ICD-10-CM | POA: Diagnosis present

## 2014-11-14 DIAGNOSIS — Z833 Family history of diabetes mellitus: Secondary | ICD-10-CM

## 2014-11-14 DIAGNOSIS — R739 Hyperglycemia, unspecified: Secondary | ICD-10-CM | POA: Diagnosis present

## 2014-11-14 DIAGNOSIS — F10939 Alcohol use, unspecified with withdrawal, unspecified: Secondary | ICD-10-CM

## 2014-11-14 DIAGNOSIS — E43 Unspecified severe protein-calorie malnutrition: Secondary | ICD-10-CM | POA: Diagnosis present

## 2014-11-14 DIAGNOSIS — I4891 Unspecified atrial fibrillation: Secondary | ICD-10-CM | POA: Diagnosis present

## 2014-11-14 DIAGNOSIS — A419 Sepsis, unspecified organism: Principal | ICD-10-CM

## 2014-11-14 DIAGNOSIS — R296 Repeated falls: Secondary | ICD-10-CM | POA: Diagnosis present

## 2014-11-14 DIAGNOSIS — I10 Essential (primary) hypertension: Secondary | ICD-10-CM | POA: Diagnosis not present

## 2014-11-14 DIAGNOSIS — W19XXXD Unspecified fall, subsequent encounter: Secondary | ICD-10-CM | POA: Diagnosis not present

## 2014-11-14 DIAGNOSIS — J449 Chronic obstructive pulmonary disease, unspecified: Secondary | ICD-10-CM | POA: Diagnosis present

## 2014-11-14 DIAGNOSIS — R000 Tachycardia, unspecified: Secondary | ICD-10-CM

## 2014-11-14 DIAGNOSIS — Y92009 Unspecified place in unspecified non-institutional (private) residence as the place of occurrence of the external cause: Secondary | ICD-10-CM | POA: Diagnosis not present

## 2014-11-14 DIAGNOSIS — F10231 Alcohol dependence with withdrawal delirium: Secondary | ICD-10-CM | POA: Diagnosis not present

## 2014-11-14 DIAGNOSIS — R569 Unspecified convulsions: Secondary | ICD-10-CM | POA: Diagnosis present

## 2014-11-14 DIAGNOSIS — E785 Hyperlipidemia, unspecified: Secondary | ICD-10-CM | POA: Diagnosis present

## 2014-11-14 DIAGNOSIS — Z6830 Body mass index (BMI) 30.0-30.9, adult: Secondary | ICD-10-CM | POA: Diagnosis not present

## 2014-11-14 DIAGNOSIS — Z820 Family history of epilepsy and other diseases of the nervous system: Secondary | ICD-10-CM

## 2014-11-14 DIAGNOSIS — B958 Unspecified staphylococcus as the cause of diseases classified elsewhere: Secondary | ICD-10-CM | POA: Diagnosis present

## 2014-11-14 DIAGNOSIS — I429 Cardiomyopathy, unspecified: Secondary | ICD-10-CM | POA: Diagnosis present

## 2014-11-14 DIAGNOSIS — T380X5A Adverse effect of glucocorticoids and synthetic analogues, initial encounter: Secondary | ICD-10-CM | POA: Diagnosis present

## 2014-11-14 DIAGNOSIS — Z79899 Other long term (current) drug therapy: Secondary | ICD-10-CM | POA: Diagnosis not present

## 2014-11-14 DIAGNOSIS — E871 Hypo-osmolality and hyponatremia: Secondary | ICD-10-CM | POA: Diagnosis present

## 2014-11-14 DIAGNOSIS — E8729 Other acidosis: Secondary | ICD-10-CM | POA: Diagnosis present

## 2014-11-14 DIAGNOSIS — Z23 Encounter for immunization: Secondary | ICD-10-CM

## 2014-11-14 DIAGNOSIS — F10229 Alcohol dependence with intoxication, unspecified: Secondary | ICD-10-CM | POA: Diagnosis present

## 2014-11-14 DIAGNOSIS — I472 Ventricular tachycardia: Secondary | ICD-10-CM | POA: Diagnosis present

## 2014-11-14 DIAGNOSIS — Z7952 Long term (current) use of systemic steroids: Secondary | ICD-10-CM | POA: Diagnosis not present

## 2014-11-14 DIAGNOSIS — F17210 Nicotine dependence, cigarettes, uncomplicated: Secondary | ICD-10-CM | POA: Diagnosis present

## 2014-11-14 DIAGNOSIS — F418 Other specified anxiety disorders: Secondary | ICD-10-CM | POA: Diagnosis present

## 2014-11-14 DIAGNOSIS — I5032 Chronic diastolic (congestive) heart failure: Secondary | ICD-10-CM

## 2014-11-14 DIAGNOSIS — F1020 Alcohol dependence, uncomplicated: Secondary | ICD-10-CM | POA: Diagnosis not present

## 2014-11-14 DIAGNOSIS — F1010 Alcohol abuse, uncomplicated: Secondary | ICD-10-CM

## 2014-11-14 DIAGNOSIS — E872 Acidosis: Secondary | ICD-10-CM | POA: Diagnosis present

## 2014-11-14 DIAGNOSIS — R531 Weakness: Secondary | ICD-10-CM | POA: Diagnosis present

## 2014-11-14 LAB — MAGNESIUM: Magnesium: 1.3 mg/dL — ABNORMAL LOW (ref 1.7–2.4)

## 2014-11-14 LAB — CBC WITH DIFFERENTIAL/PLATELET
BASOS PCT: 0 %
Basophils Absolute: 0 10*3/uL (ref 0.0–0.1)
EOS PCT: 0 %
Eosinophils Absolute: 0 10*3/uL (ref 0.0–0.7)
HEMATOCRIT: 43.6 % (ref 39.0–52.0)
Hemoglobin: 15.4 g/dL (ref 13.0–17.0)
LYMPHS ABS: 0.8 10*3/uL (ref 0.7–4.0)
Lymphocytes Relative: 4 %
MCH: 32.5 pg (ref 26.0–34.0)
MCHC: 35.3 g/dL (ref 30.0–36.0)
MCV: 92 fL (ref 78.0–100.0)
MONO ABS: 1 10*3/uL (ref 0.1–1.0)
MONOS PCT: 5 %
NEUTROS PCT: 91 %
Neutro Abs: 18.2 10*3/uL — ABNORMAL HIGH (ref 1.7–7.7)
PLATELETS: 112 10*3/uL — AB (ref 150–400)
RBC: 4.74 MIL/uL (ref 4.22–5.81)
RDW: 15.7 % — ABNORMAL HIGH (ref 11.5–15.5)
WBC: 20 10*3/uL — ABNORMAL HIGH (ref 4.0–10.5)

## 2014-11-14 LAB — BASIC METABOLIC PANEL
Anion gap: 16 — ABNORMAL HIGH (ref 5–15)
BUN: 9 mg/dL (ref 6–20)
CHLORIDE: 95 mmol/L — AB (ref 101–111)
CO2: 19 mmol/L — ABNORMAL LOW (ref 22–32)
CREATININE: 0.76 mg/dL (ref 0.61–1.24)
Calcium: 7.2 mg/dL — ABNORMAL LOW (ref 8.9–10.3)
GFR calc Af Amer: >60 mL/min (ref >60)
GFR calc non Af Amer: >60 mL/min (ref >60)
Glucose, Bld: 242 mg/dL — ABNORMAL HIGH (ref 65–99)
Potassium: 2.9 mmol/L — ABNORMAL LOW (ref 3.5–5.1)
SODIUM: 130 mmol/L — AB (ref 135–145)

## 2014-11-14 LAB — TROPONIN I: Troponin I: <0.03 ng/mL (ref <0.031)

## 2014-11-14 LAB — RAPID URINE DRUG SCREEN, HOSP PERFORMED
AMPHETAMINES: NOT DETECTED
BENZODIAZEPINES: NOT DETECTED
Barbiturates: NOT DETECTED
COCAINE: NOT DETECTED
OPIATES: NOT DETECTED
TETRAHYDROCANNABINOL: NOT DETECTED

## 2014-11-14 LAB — URINALYSIS, ROUTINE W REFLEX MICROSCOPIC
BILIRUBIN URINE: NEGATIVE
Glucose, UA: NEGATIVE mg/dL
Ketones, ur: NEGATIVE mg/dL
Leukocytes, UA: NEGATIVE
NITRITE: NEGATIVE
PH: 6 (ref 5.0–8.0)
Protein, ur: 30 mg/dL — AB
SPECIFIC GRAVITY, URINE: 1.01 (ref 1.005–1.030)
UROBILINOGEN UA: 1 mg/dL (ref 0.0–1.0)

## 2014-11-14 LAB — COMPREHENSIVE METABOLIC PANEL
ALBUMIN: 3.7 g/dL (ref 3.5–5.0)
ALK PHOS: 151 U/L — AB (ref 38–126)
ALT: 86 U/L — ABNORMAL HIGH (ref 17–63)
AST: 156 U/L — AB (ref 15–41)
Anion gap: 19 — ABNORMAL HIGH (ref 5–15)
BILIRUBIN TOTAL: 1.4 mg/dL — AB (ref 0.3–1.2)
BUN: 9 mg/dL (ref 6–20)
CALCIUM: 8 mg/dL — AB (ref 8.9–10.3)
CO2: 20 mmol/L — AB (ref 22–32)
Chloride: 91 mmol/L — ABNORMAL LOW (ref 101–111)
Creatinine, Ser: 0.92 mg/dL (ref 0.61–1.24)
GFR calc Af Amer: >60 mL/min (ref >60)
GFR calc non Af Amer: >60 mL/min (ref >60)
GLUCOSE: 273 mg/dL — AB (ref 65–99)
Potassium: 3.8 mmol/L (ref 3.5–5.1)
SODIUM: 130 mmol/L — AB (ref 135–145)
TOTAL PROTEIN: 7.1 g/dL (ref 6.5–8.1)

## 2014-11-14 LAB — URINE MICROSCOPIC-ADD ON

## 2014-11-14 LAB — GLUCOSE, CAPILLARY
GLUCOSE-CAPILLARY: 243 mg/dL — AB (ref 65–99)
Glucose-Capillary: 199 mg/dL — ABNORMAL HIGH (ref 65–99)

## 2014-11-14 LAB — MRSA PCR SCREENING: MRSA BY PCR: POSITIVE — AB

## 2014-11-14 LAB — LACTIC ACID, PLASMA
LACTIC ACID, VENOUS: 3.4 mmol/L — AB (ref 0.5–2.0)
LACTIC ACID, VENOUS: 3.7 mmol/L — AB (ref 0.5–2.0)
LACTIC ACID, VENOUS: 3.1 mmol/L — AB (ref 0.5–2.0)

## 2014-11-14 LAB — BRAIN NATRIURETIC PEPTIDE: B Natriuretic Peptide: 49.7 pg/mL (ref 0.0–100.0)

## 2014-11-14 LAB — ETHANOL: Alcohol, Ethyl (B): 358 mg/dL (ref <5)

## 2014-11-14 MED ORDER — INSULIN ASPART 100 UNIT/ML ~~LOC~~ SOLN
0.0000 [IU] | Freq: Three times a day (TID) | SUBCUTANEOUS | Status: DC
Start: 2014-11-14 — End: 2014-11-22
  Administered 2014-11-14: 3 [IU] via SUBCUTANEOUS
  Administered 2014-11-15: 2 [IU] via SUBCUTANEOUS
  Administered 2014-11-15 – 2014-11-16 (×3): 1 [IU] via SUBCUTANEOUS
  Administered 2014-11-16 – 2014-11-18 (×4): 2 [IU] via SUBCUTANEOUS
  Administered 2014-11-18: 1 [IU] via SUBCUTANEOUS
  Administered 2014-11-19: 2 [IU] via SUBCUTANEOUS
  Administered 2014-11-19: 1 [IU] via SUBCUTANEOUS
  Administered 2014-11-19 – 2014-11-20 (×3): 2 [IU] via SUBCUTANEOUS
  Administered 2014-11-21: 1 [IU] via SUBCUTANEOUS
  Administered 2014-11-21: 2 [IU] via SUBCUTANEOUS
  Administered 2014-11-21: 1 [IU] via SUBCUTANEOUS

## 2014-11-14 MED ORDER — MUPIROCIN 2 % EX OINT
1.0000 "application " | TOPICAL_OINTMENT | Freq: Two times a day (BID) | CUTANEOUS | Status: AC
  Administered 2014-11-14 – 2014-11-19 (×10): 1 via NASAL
  Filled 2014-11-14 (×2): qty 22

## 2014-11-14 MED ORDER — DULOXETINE HCL 30 MG PO CPEP
30.0000 mg | ORAL_CAPSULE | Freq: Every day | ORAL | Status: DC
  Administered 2014-11-14 – 2014-11-22 (×9): 30 mg via ORAL
  Filled 2014-11-14 (×9): qty 1

## 2014-11-14 MED ORDER — HYDROCORTISONE NA SUCCINATE PF 100 MG IJ SOLR
50.0000 mg | Freq: Two times a day (BID) | INTRAMUSCULAR | Status: DC
  Administered 2014-11-14: 50 mg via INTRAVENOUS
  Administered 2014-11-15: 100 mg via INTRAVENOUS
  Administered 2014-11-15 – 2014-11-16 (×2): 50 mg via INTRAVENOUS
  Filled 2014-11-14 (×4): qty 2

## 2014-11-14 MED ORDER — ADULT MULTIVITAMIN W/MINERALS CH
1.0000 | ORAL_TABLET | Freq: Every day | ORAL | Status: DC
  Administered 2014-11-15 – 2014-11-22 (×8): 1 via ORAL
  Filled 2014-11-14 (×8): qty 1

## 2014-11-14 MED ORDER — LORAZEPAM 1 MG PO TABS: 1.0000 mg | ORAL_TABLET | Freq: Four times a day (QID) | ORAL | Status: DC | PRN

## 2014-11-14 MED ORDER — ADULT MULTIVITAMIN W/MINERALS CH: 1.0000 | ORAL_TABLET | Freq: Every day | ORAL | Status: DC

## 2014-11-14 MED ORDER — LORAZEPAM 2 MG/ML IJ SOLN: 0.0000 mg | Freq: Two times a day (BID) | INTRAMUSCULAR | Status: DC

## 2014-11-14 MED ORDER — LORAZEPAM 2 MG/ML IJ SOLN: 1.0000 mg | Freq: Four times a day (QID) | INTRAMUSCULAR | Status: DC | PRN

## 2014-11-14 MED ORDER — LORAZEPAM 2 MG/ML IJ SOLN
2.0000 mg | INTRAMUSCULAR | Status: DC | PRN
  Administered 2014-11-15 – 2014-11-22 (×17): 2 mg via INTRAVENOUS
  Filled 2014-11-14 (×17): qty 1

## 2014-11-14 MED ORDER — POTASSIUM CHLORIDE 10 MEQ/100ML IV SOLN
10.0000 meq | INTRAVENOUS | Status: AC
  Administered 2014-11-14 (×3): 10 meq via INTRAVENOUS
  Filled 2014-11-14: qty 100

## 2014-11-14 MED ORDER — THIAMINE HCL 100 MG/ML IJ SOLN
100.0000 mg | Freq: Every day | INTRAMUSCULAR | Status: DC
  Administered 2014-11-14: 100 mg via INTRAVENOUS
  Filled 2014-11-14: qty 2
  Filled 2014-11-14 (×4): qty 1
  Filled 2014-11-14: qty 2
  Filled 2014-11-14: qty 1

## 2014-11-14 MED ORDER — SODIUM CHLORIDE 0.9 % IV BOLUS (SEPSIS)
1000.0000 mL | Freq: Once | INTRAVENOUS | Status: AC
  Administered 2014-11-14: 1000 mL via INTRAVENOUS

## 2014-11-14 MED ORDER — ASPIRIN EC 81 MG PO TBEC
81.0000 mg | DELAYED_RELEASE_TABLET | Freq: Every day | ORAL | Status: DC
  Administered 2014-11-14 – 2014-11-22 (×9): 81 mg via ORAL
  Filled 2014-11-14 (×9): qty 1

## 2014-11-14 MED ORDER — VITAMIN B-1 100 MG PO TABS
100.0000 mg | ORAL_TABLET | Freq: Every day | ORAL | Status: DC
  Administered 2014-11-15 – 2014-11-22 (×8): 100 mg via ORAL
  Filled 2014-11-14 (×8): qty 1

## 2014-11-14 MED ORDER — DILTIAZEM HCL 100 MG IV SOLR
5.0000 mg/h | INTRAVENOUS | Status: DC
  Administered 2014-11-14: 5 mg/h via INTRAVENOUS
  Administered 2014-11-14: 20 mg/h via INTRAVENOUS
  Administered 2014-11-15: 15 mg/h via INTRAVENOUS
  Administered 2014-11-15: 20 mg/h via INTRAVENOUS
  Filled 2014-11-14 (×4): qty 100

## 2014-11-14 MED ORDER — LORAZEPAM 2 MG/ML IJ SOLN
0.0000 mg | Freq: Two times a day (BID) | INTRAMUSCULAR | Status: AC
  Administered 2014-11-17: 1 mg via INTRAVENOUS
  Administered 2014-11-17 – 2014-11-18 (×2): 2 mg via INTRAVENOUS
  Administered 2014-11-18: 1 mg via INTRAVENOUS
  Filled 2014-11-14 (×4): qty 1

## 2014-11-14 MED ORDER — LORAZEPAM 2 MG/ML IJ SOLN
0.0000 mg | INTRAMUSCULAR | Status: AC
Start: 2014-11-15 — End: 2014-11-17
  Administered 2014-11-15 (×3): 2 mg via INTRAVENOUS
  Administered 2014-11-15: 4 mg via INTRAVENOUS
  Administered 2014-11-15 – 2014-11-16 (×5): 2 mg via INTRAVENOUS
  Filled 2014-11-14: qty 1
  Filled 2014-11-14: qty 2
  Filled 2014-11-14 (×7): qty 1
  Filled 2014-11-14: qty 2

## 2014-11-14 MED ORDER — METOPROLOL TARTRATE 1 MG/ML IV SOLN
2.5000 mg | INTRAVENOUS | Status: DC | PRN
  Administered 2014-11-14 – 2014-11-15 (×2): 2.5 mg via INTRAVENOUS
  Filled 2014-11-14 (×2): qty 5

## 2014-11-14 MED ORDER — VANCOMYCIN HCL 10 G IV SOLR
1250.0000 mg | Freq: Two times a day (BID) | INTRAVENOUS | Status: DC
  Administered 2014-11-14 – 2014-11-16 (×4): 1250 mg via INTRAVENOUS
  Filled 2014-11-14 (×4): qty 1250

## 2014-11-14 MED ORDER — CETYLPYRIDINIUM CHLORIDE 0.05 % MT LIQD
7.0000 mL | Freq: Two times a day (BID) | OROMUCOSAL | Status: DC
  Administered 2014-11-14 – 2014-11-22 (×17): 7 mL via OROMUCOSAL

## 2014-11-14 MED ORDER — SODIUM CHLORIDE 0.9 % IV BOLUS (SEPSIS)
500.0000 mL | Freq: Once | INTRAVENOUS | Status: AC
  Administered 2014-11-14: 500 mL via INTRAVENOUS

## 2014-11-14 MED ORDER — FOLIC ACID 1 MG PO TABS
1.0000 mg | ORAL_TABLET | Freq: Every day | ORAL | Status: DC
  Administered 2014-11-15 – 2014-11-22 (×8): 1 mg via ORAL
  Filled 2014-11-14 (×8): qty 1

## 2014-11-14 MED ORDER — ENOXAPARIN SODIUM 40 MG/0.4ML ~~LOC~~ SOLN
40.0000 mg | SUBCUTANEOUS | Status: DC
  Administered 2014-11-14 – 2014-11-21 (×8): 40 mg via SUBCUTANEOUS
  Filled 2014-11-14 (×9): qty 0.4

## 2014-11-14 MED ORDER — LORAZEPAM 2 MG/ML IJ SOLN
1.0000 mg | Freq: Four times a day (QID) | INTRAMUSCULAR | Status: DC | PRN
Start: 2014-11-14 — End: 2014-11-14
  Administered 2014-11-14: 1 mg via INTRAVENOUS
  Filled 2014-11-14: qty 1

## 2014-11-14 MED ORDER — PIPERACILLIN-TAZOBACTAM 3.375 G IVPB
3.3750 g | Freq: Three times a day (TID) | INTRAVENOUS | Status: DC
  Administered 2014-11-14 – 2014-11-16 (×7): 3.375 g via INTRAVENOUS
  Filled 2014-11-14 (×7): qty 50

## 2014-11-14 MED ORDER — VANCOMYCIN HCL IN DEXTROSE 1-5 GM/200ML-% IV SOLN
1000.0000 mg | INTRAVENOUS | Status: AC
  Administered 2014-11-14: 1000 mg via INTRAVENOUS
  Filled 2014-11-14: qty 200

## 2014-11-14 MED ORDER — LORAZEPAM 2 MG/ML IJ SOLN
2.0000 mg | Freq: Once | INTRAMUSCULAR | Status: AC
  Administered 2014-11-14: 2 mg via INTRAVENOUS
  Filled 2014-11-14: qty 1

## 2014-11-14 MED ORDER — THIAMINE HCL 100 MG/ML IJ SOLN
Freq: Once | INTRAVENOUS | Status: AC
  Administered 2014-11-14: 14:00:00 via INTRAVENOUS
  Filled 2014-11-14: qty 1000

## 2014-11-14 MED ORDER — SODIUM CHLORIDE 0.9 % IV BOLUS (SEPSIS)
500.0000 mL | Freq: Once | INTRAVENOUS | Status: AC
Start: 2014-11-14 — End: 2014-11-14
  Administered 2014-11-14: 500 mL via INTRAVENOUS

## 2014-11-14 MED ORDER — LORAZEPAM 2 MG/ML IJ SOLN
0.0000 mg | Freq: Four times a day (QID) | INTRAMUSCULAR | Status: DC
  Administered 2014-11-14: 2 mg via INTRAVENOUS
  Filled 2014-11-14: qty 2

## 2014-11-14 MED ORDER — CHLORHEXIDINE GLUCONATE CLOTH 2 % EX PADS
6.0000 | MEDICATED_PAD | Freq: Every day | CUTANEOUS | Status: AC
Start: 2014-11-15 — End: 2014-11-19
  Administered 2014-11-15 – 2014-11-19 (×5): 6 via TOPICAL

## 2014-11-14 MED ORDER — LEVALBUTEROL HCL 0.63 MG/3ML IN NEBU
0.6300 mg | INHALATION_SOLUTION | Freq: Four times a day (QID) | RESPIRATORY_TRACT | Status: DC | PRN
  Administered 2014-11-14 – 2014-11-20 (×5): 0.63 mg via RESPIRATORY_TRACT
  Filled 2014-11-14 (×5): qty 3

## 2014-11-14 NOTE — Progress Notes (Signed)
CRITICAL VALUE ALERT  Critical value received:  Lactic Acid 3.2   Date of notification:  11/14/2014  Time of notification:  1745  Critical value read back:Yes.    Nurse who received alert:  Lezlie Lye, RN  MD notified (1st page):  Dr. Sharl Ma  Time of first page:  1746  MD notified (2nd page):  Time of second page:  Responding MD:  Dr. Sharl Ma  Time MD responded:  [phone]

## 2014-11-14 NOTE — ED Provider Notes (Signed)
CSN: [id number]     Arrival date & time 11/14/14  4098 History   First MD Initiated Contact with Patient 11/14/14 [phone]     Chief Complaint  Patient presents with  . Fall  . ETOH Intoxication     Low 5 caveat due to intoxication (Consider location/radiation/quality/duration/timing/severity/associated sxs/prior Treatment) Patient is a 64 y.o. male presenting with fall. The history is provided by the patient and the EMS personnel.  Fall This is a recurrent problem.   patient presents with generalized weakness. Reportedly had some falls, which is not unusual due to the patient's alcoholism. States his been too weak to get up and move around. Denies chest pain. Smells of alcohol. States he has not been eating and drinking much. Has had some mildly worsening shortness of breath. Has a history of bronchiectasis. Denies fever or chills. Has reportedly not been taking his medications.  Past Medical History  Diagnosis Date  . CHF (congestive heart failure) (HCC) 10/2010    ECHO:  EF 40%, Grade II diastolic dysfunction  . Alcoholism (HCC)   . Allergy     seasonal   . Anxiety   . Depression   . Asthma   . Hyperlipidemia   . Hypertension   . Seizures (HCC)   . COPD (chronic obstructive pulmonary disease) [facility])    Past Surgical History  Procedure Laterality Date  . Toenail excision Right 1967    Removal of ingrown toenail [Other]   Family History  Problem Relation Age of Onset  . Alzheimer's disease Mother   . Diabetes Mother   . Heart disease Maternal Uncle    Social History  Substance Use Topics  . Smoking status: Current Every Day Smoker -- 1.25 packs/day for 40 years    Types: Cigarettes  . Smokeless tobacco: Never Used     Comment: a little more than 1 ppd (02/17/14)  . Alcohol Use: Yes     Comment: 1/2 galloon liquior in 2 days    Review of Systems  Unable to perform ROS     Allergies  Review of patient's allergies indicates no known allergies.  Home Medications    Prior to Admission medications   Medication Sig Start Date End Date Taking? Authorizing Provider  acetaminophen (TYLENOL) 500 MG tablet Take 1,500 mg by mouth every 6 (six) hours as needed for headache (headache).    Yes Historical Provider, MD  albuterol (PROVENTIL) (2.5 MG/3ML) 0.083% nebulizer solution USE 3 MLS BY NEBULIZATION EVERY 6 HOURS AS NEEDED FOR WHEEZING OR SHORTNESS OF BREATH 10/20/14  Yes Tobey Grim, MD  aspirin EC 81 MG EC tablet Take 1 tablet (81 mg total) by mouth daily. 11/13/12  Yes Renae Fickle, MD  atorvastatin (LIPITOR) 40 MG tablet Take 1 tablet (40 mg total) by mouth daily at 6 PM. 03/17/14  Yes Chrystie Nose, MD  B-Complex CAPS Take 1 capsule by mouth daily.   Yes Historical Provider, MD  carvedilol (COREG) 25 MG tablet Take 1 tablet (25 mg total) by mouth 2 (two) times daily with a meal. NEED OV. 09/05/14  Yes Chrystie Nose, MD  clonazePAM (KLONOPIN) 0.5 MG tablet TAKE 1 TABLET BY MOUTH TWICE A DAY AS NEEDED FOR ANXIETY 10/17/14  Yes Tobey Grim, MD  COMBIVENT RESPIMAT 20-100 MCG/ACT AERS respimat INHALE TWO PUFFS BY MOUTH EVERY FOUR HOURS AS NEEDED FOR WHEEZING 10/14/14  Yes Tobey Grim, MD  DULoxetine (CYMBALTA) 30 MG capsule TAKE ONE CAPSULE BY MOUTH ONE TIME DAILY 07/27/14  Yes Uvaldo Rising, MD  ePHEDrine-GuaiFENesin (PRIMATENE ASTHMA) 12.5-200 MG TABS Take 25 mg by mouth as needed (difficulty breathing).    Yes Historical Provider, MD  fluticasone Aleda Grana) 50 MCG/ACT nasal spray Use two sprays in each nostril daily 11/08/13  Yes Tobey Grim, MD  furosemide (LASIX) 40 MG tablet Take 1 tablet (40 mg total) by mouth daily. 03/17/14  Yes Chrystie Nose, MD  gabapentin (NEURONTIN) 300 MG capsule TAKE ONE CAPSULE BY MOUTH THREE TIMES DAILY  03/17/14  Yes Tobey Grim, MD  lisinopril (PRINIVIL,ZESTRIL) 5 MG tablet TAKE 1 TABLET BY MOUTH EVERY DAY 08/05/14  Yes Tobey Grim, MD  Magnesium 500 MG TABS Take 500 mg by mouth daily.   Yes Historical  Provider, MD  montelukast (SINGULAIR) 10 MG tablet TAKE ONE TABLET BY MOUTH NIGHTLY AT BEDTIME 10/17/14  Yes Tobey Grim, MD  Multiple Vitamin (MULTIVITAMIN WITH MINERALS) TABS tablet Take 1 tablet by mouth daily.   Yes Historical Provider, MD  predniSONE (DELTASONE) 10 MG tablet Take 6 tablets (60 mg total) by mouth daily. 11/09/14  Yes Derwood Kaplan, MD  sodium chloride 1 G tablet Take 1 g by mouth 2 (two) times daily with a meal.   Yes Historical Provider, MD  spironolactone (ALDACTONE) 25 MG tablet TAKE HALF TABLET BY MOUTH DAILY  03/17/14  Yes Chrystie Nose, MD  tiotropium (SPIRIVA) 18 MCG inhalation capsule Place 1 capsule (18 mcg total) into inhaler and inhale daily. 03/16/14  Yes Tobey Grim, MD  triamcinolone (KENALOG) 0.025 % ointment Apply 1 application topically 2 (two) times daily. Patient taking differently: Apply 1 application topically 2 (two) times daily as needed (for rash).  10/26/13  Yes Tobey Grim, MD  Vitamin D, Ergocalciferol, (DRISDOL) 50000 UNITS CAPS capsule TAKE 1 CAPSULE BY MOUTH EVERY 7 DAYS. 06/06/14  Yes Tobey Grim, MD  potassium chloride SA (K-DUR,KLOR-CON) 20 MEQ tablet Take 2 tablets (40 mEq total) by mouth daily. 03/17/14   Chrystie Nose, MD   BP 134/90 mmHg  Pulse 120  Temp(Src) 98.3 F (36.8 C) (Oral)  Resp 28  SpO2 93% Physical Exam  Constitutional: He appears well-developed.  HENT:  Ecchymosis to bridge of nose, not new per patient. Mucous membranes are dry  Eyes: Pupils are equal, round, and reactive to light.  Neck: Neck supple.  Cardiovascular:  Tachycardia  Pulmonary/Chest:  Harsh breath sounds, particularly on right.  Abdominal:  Mild right-sided mid abdominal tenderness. Large umbilical hernia. Not reducible minimally tender.  Musculoskeletal: He exhibits edema.  Neurological: He is alert.  Skin: Skin is warm.  Psychiatric:  Patient appears intoxicated    ED Course  Procedures (including critical care time) Labs  Review Labs Reviewed  COMPREHENSIVE METABOLIC PANEL - Abnormal; Notable for the following:    Sodium 130 (*)    Chloride 91 (*)    CO2 20 (*)    Glucose, Bld 273 (*)    Calcium 8.0 (*)    AST 156 (*)    ALT 86 (*)    Alkaline Phosphatase 151 (*)    Total Bilirubin 1.4 (*)    Anion gap 19 (*)    All other components within normal limits  CBC WITH DIFFERENTIAL/PLATELET - Abnormal; Notable for the following:    WBC 20.0 (*)    RDW 15.7 (*)    Platelets 112 (*)    Neutro Abs 18.2 (*)    All other components within normal limits  ETHANOL -  Abnormal; Notable for the following:    Alcohol, Ethyl (B) 358 (*)    All other components within normal limits  MAGNESIUM - Abnormal; Notable for the following:    Magnesium 1.3 (*)    All other components within normal limits  CULTURE, BLOOD (ROUTINE X 2)  CULTURE, BLOOD (ROUTINE X 2)  URINE CULTURE  MRSA PCR SCREENING  TROPONIN I  BRAIN NATRIURETIC PEPTIDE  URINALYSIS, ROUTINE W REFLEX MICROSCOPIC (NOT AT [facility])  URINE RAPID DRUG SCREEN, HOSP PERFORMED  HEMOGLOBIN A1C    Imaging Review Ct Head Wo Contrast  11/14/2014   CLINICAL DATA:  Pain following fall.  Ethanol intoxication.  EXAM: CT HEAD WITHOUT CONTRAST  TECHNIQUE: Contiguous axial images were obtained from the base of the skull through the vertex without intravenous contrast.  COMPARISON:  November 08, 2014  FINDINGS: There is age related volume loss. There is no intracranial mass, hemorrhage, extra-axial fluid collection, or midline shift. Gray-white compartments appear normal. No acute infarct evident. The bony calvarium appears intact. The mastoid air cells are clear. There is mucosal thickening in the left maxillary antrum as well as in the ethmoid air cells bilaterally.  IMPRESSION: Age related volume loss. No intracranial mass, hemorrhage, or focal gray -white compartment lesions/acute appearing infarct. No extra-axial fluid collection. There are areas of paranasal sinus disease.    Electronically Signed   By: Bretta Bang III M.D.   On: 11/14/2014 14:12   Dg Chest Portable 1 View  11/14/2014   CLINICAL DATA:  Weakness, CHF, smoker  EXAM: PORTABLE CHEST 1 VIEW  COMPARISON:  11/09/2014  FINDINGS: Mild bilateral interstitial prominence likely chronic. There is no focal parenchymal opacity. There is no pleural effusion or pneumothorax. The heart and mediastinal contours are unremarkable.  The osseous structures are unremarkable.  IMPRESSION: No active disease.   Electronically Signed   By: Elige Ko   On: 11/14/2014 10:00   I have personally reviewed and evaluated these images and lab results as part of my medical decision-making.   EKG Interpretation None     ED ECG REPORT   Date: 11/14/2014  Rate: 119  Rhythm: sinus tachycardia  QRS Axis: normal  Intervals: normal  ST/T Wave abnormalities: nonspecific ST/T changes  Conduction Disutrbances:right bundle branch block  Narrative Interpretation:   Old EKG Reviewed: unchanged    MDM   Final diagnoses:  Alcoholism (HCC)  Tachycardia    Patient with generalized weakness. Is an alcoholic and is apparently been drinking and not eating much. Has a fall but no apparent severe trauma from a. Also found to be tachycardic. Somewhat improved with IV fluids. Will admit to stepdown bed.  CRITICAL CARE Performed by: Billee Cashing Total critical care time: 30 Critical care time was exclusive of separately billable procedures and treating other patients. Critical care was necessary to treat or prevent imminent or life-threatening deterioration. Critical care was time spent personally by me on the following activities: development of treatment plan with patient and/or surrogate as well as nursing, discussions with consultants, evaluation of patient's response to treatment, examination of patient, obtaining history from patient or surrogate, ordering and performing treatments and interventions, ordering and review  of laboratory studies, ordering and review of radiographic studies, pulse oximetry and re-evaluation of patient's condition.       Benjiman Core, MD 11/14/14 1539

## 2014-11-14 NOTE — Progress Notes (Signed)
ANTIBIOTIC CONSULT NOTE - INITIAL  Pharmacy Consult for vancomycin/zosyn Indication: rule out sepsis  No Known Allergies  Patient Measurements:   Adjusted Body Weight:   Vital Signs: Temp: 98.3 F (36.8 C) (10/10 0925) Temp Source: Oral (10/10 0925) BP: 113/68 mmHg (10/10 1131) Pulse Rate: 127 (10/10 1131) Intake/Output from previous day:   Intake/Output from this shift:    Labs:  Recent Labs  11/14/14 0953  WBC 20.0*  HGB 15.4  PLT 112*  CREATININE 0.92   CrCl cannot be calculated (Unknown ideal weight.). No results for input(s): VANCOTROUGH, VANCOPEAK, VANCORANDOM, GENTTROUGH, GENTPEAK, GENTRANDOM, TOBRATROUGH, TOBRAPEAK, TOBRARND, AMIKACINPEAK, AMIKACINTROU, AMIKACIN in the last 72 hours.   Microbiology: No results found for this or any previous visit (from the past 720 hour(s)).  Medical History: Past Medical History  Diagnosis Date  . CHF (congestive heart failure) (HCC) 10/2010    ECHO:  EF 40%, Grade II diastolic dysfunction  . Alcoholism (HCC)   . Allergy     seasonal   . Anxiety   . Depression   . Asthma   . Hyperlipidemia   . Hypertension   . Seizures (HCC)   . COPD (chronic obstructive pulmonary disease) (HCC)    Assessment: Juan [name] presents with weakness, lethargy and fall.  Known history of EtOHism.  Vancomycin and zosyn order per pharmacy for possible sepsis (SIRS = elevated WBC and tachycardia).  10/10 >>vancomycin>> 10/10 >> zosyn  >>    / blood: / urine:  / sputum:   Renal: Scr is WNL WBC elevated Afebrile  Dose changes/levels:   Goal of Therapy:  Vancomycin trough level 15-20 mcg/ml  Plan:   Vancomycin 1gm x 1 then 1250mg  IV q12h  Follow renal function and drug levels as indicated  3.375gm IV q8h over 4h infusion  Juliette Alcide, PharmD, BCPS.   Pager: [phone] 11/14/2014,1:15 PM

## 2014-11-14 NOTE — ED Notes (Signed)
Pt informed of need for urine sample, unable to give at this time.

## 2014-11-14 NOTE — Progress Notes (Signed)
CARDIOLOGY CONSULT NOTE   Patient ID: Juan [name] MRN: [id number] DOB/AGE: 05-31-1950 64 y.o.  Admit date: 11/14/2014  Primary Physician   Renold Don, MD Primary Cardiologist   Dr [name], last o.v. 02/2014 Reason for Consultation   Rapid afib  Juan [name] is a 64 y.o. year old male with a history of cardiomyopathy felt 2nd ETOH (30-35% in 2014, then normalized), NSVT 2014 in the setting of ETOH w/d and seizures. He also has a bifasicular block at baseline and has had a non-ischemic MV (2014).   Pt last saw Dr [name] 02/2014 and his EF at that time was normal. He was on ACE, BB and Lasix plus K+.   He was seen in the ER 11/08/2014 for frequent falls.  Wheezing, also with congestion on CXR, got nebs and Lasix x 1. No telemetry strips or ECG from that visit. His HR was as high as 121.   Juan [name] continued to be weak and have frequent falls. This am, he fell on the porch and was too weak to get up. He requested EMS and they were called. His HR was elevated on the initial ECG, and atrial fib was noted..  Juan [name] is not aware of his irregular HR. He feels terrible, and is weak, but does not notice his HR is rapid. He has not checked his HR/BP at home recently. PO intake has been poor.   He has no bleeding issues, some nausea and dry heaves. Rare diarrhea. No other illnesses. Admits to heavy ETOH use recently. Told his wife he passed out about 2 weeks ago (had not had a lot to drink that day), but she did not witness it and he did not seek help.  Past Medical History  Diagnosis Date  . CHF (congestive heart failure) (HCC) 10/2010    ECHO:  EF 40%, Grade II diastolic dysfunction  . Alcoholism (HCC)   . Allergy     seasonal   . Anxiety   . Depression   . Asthma   . Hyperlipidemia   . Hypertension   . Seizures (HCC)   . COPD (chronic obstructive pulmonary disease) [facility])      Past Surgical History  Procedure Laterality Date  . Toenail excision Right 1967     Removal of ingrown toenail [Other]    No Known Allergies  I have reviewed the patient's current medications . antiseptic oral rinse  7 mL Mouth Rinse BID  . aspirin EC  81 mg Oral Daily  . [START ON 11/15/2014] Chlorhexidine Gluconate Cloth  6 each Topical Q0600  . DULoxetine  30 mg Oral Daily  . enoxaparin (LOVENOX) injection  40 mg Subcutaneous Q24H  . hydrocortisone sod succinate (SOLU-CORTEF) inj  50 mg Intravenous BID  . insulin aspart  0-9 Units Subcutaneous TID WC  . multivitamin with minerals  1 tablet Oral Daily  . mupirocin ointment  1 application Nasal BID  . piperacillin-tazobactam (ZOSYN)  IV  3.375 g Intravenous 3 times per day  . vancomycin  1,250 mg Intravenous Q12H   . diltiazem (CARDIZEM) infusion 10 mg/hr (11/14/14 1633)   LORazepam **OR** LORazepam  Medication Sig  acetaminophen (TYLENOL) 500 MG tablet Take 1,500 mg by mouth every 6 (six) hours as needed for headache (headache).   albuterol (PROVENTIL) (2.5 MG/3ML) 0.083% nebulizer solution USE 3 MLS BY NEBULIZATION EVERY 6 HOURS AS NEEDED FOR WHEEZING OR SHORTNESS OF BREATH  aspirin EC 81 MG EC tablet Take 1 tablet (81  mg total) by mouth daily.  atorvastatin (LIPITOR) 40 MG tablet Take 1 tablet (40 mg total) by mouth daily at 6 PM.  B-Complex CAPS Take 1 capsule by mouth daily.  carvedilol (COREG) 25 MG tablet Take 1 tablet (25 mg total) by mouth 2 (two) times daily with a meal. NEED OV.  clonazePAM (KLONOPIN) 0.5 MG tablet TAKE 1 TABLET BY MOUTH TWICE A DAY AS NEEDED FOR ANXIETY  COMBIVENT RESPIMAT 20-100 MCG/ACT AERS respimat INHALE TWO PUFFS BY MOUTH EVERY FOUR HOURS AS NEEDED FOR WHEEZING  DULoxetine (CYMBALTA) 30 MG capsule TAKE ONE CAPSULE BY MOUTH ONE TIME DAILY  ePHEDrine-GuaiFENesin (PRIMATENE ASTHMA) 12.5-200 MG TABS Take 25 mg by mouth as needed (difficulty breathing).   fluticasone (FLONASE) 50 MCG/ACT nasal spray Use two sprays in each nostril daily  furosemide (LASIX) 40 MG tablet Take 1 tablet  (40 mg total) by mouth daily.  gabapentin (NEURONTIN) 300 MG capsule TAKE ONE CAPSULE BY MOUTH THREE TIMES DAILY   lisinopril (PRINIVIL,ZESTRIL) 5 MG tablet TAKE 1 TABLET BY MOUTH EVERY DAY  Magnesium 500 MG TABS Take 500 mg by mouth daily.  montelukast (SINGULAIR) 10 MG tablet TAKE ONE TABLET BY MOUTH NIGHTLY AT BEDTIME  Multiple Vitamin (MULTIVITAMIN WITH MINERALS) TABS tablet Take 1 tablet by mouth daily.  predniSONE (DELTASONE) 10 MG tablet Take 6 tablets (60 mg total) by mouth daily.  sodium chloride 1 G tablet Take 1 g by mouth 2 (two) times daily with a meal.  spironolactone (ALDACTONE) 25 MG tablet TAKE HALF TABLET BY MOUTH DAILY   tiotropium (SPIRIVA) 18 MCG inhalation capsule Place 1 capsule (18 mcg total) into inhaler and inhale daily.  triamcinolone (KENALOG) 0.025 % ointment Apply 1 application topically 2 (two) times daily. Patient taking differently: Apply 1 application topically 2 (two) times daily as needed (for rash).   Vitamin D, Ergocalciferol, (DRISDOL) 50000 UNITS CAPS capsule TAKE 1 CAPSULE BY MOUTH EVERY 7 DAYS.  potassium chloride SA (K-DUR,KLOR-CON) 20 MEQ tablet Take 2 tablets (40 mEq total) by mouth daily.     Social History   Social History  . Marital Status: Married    Spouse Name: N/A  . Number of Children: N/A  . Years of Education: N/A   Occupational History  . Unemployed    Social History Main Topics  . Smoking status: Current Every Day Smoker -- 1.25 packs/day for 40 years    Types: Cigarettes  . Smokeless tobacco: Never Used     Comment: a little more than 1 ppd (02/17/14)  . Alcohol Use: Yes     Comment: 1/2 galloon liquior in 2 days  . Drug Use: No  . Sexual Activity: Not on file   Other Topics Concern  . Not on file   Social History Narrative   Lives with wife in [place].    Family Status  Relation Status Death Age  . Mother Deceased   . Father Deceased    Family History  Problem Relation Age of Onset  . Alzheimer's disease  Mother   . Diabetes Mother   . Heart disease Maternal Uncle      ROS:  Full 14 point review of systems complete and found to be negative unless listed above.  Physical Exam: Blood pressure 162/91, pulse 125, temperature 98.6 F (37 C), temperature source Oral, resp. rate 26, SpO2 93 %.  General: Well developed, well nourished, male in no acute distress Head: Eyes PERRLA, No xanthomas.   Normocephalic and atraumatic, oropharynx without edema or exudate.  Dentition:  Lungs:  Heart: HRRR S1 S2, no rub/gallop, Heart irregular rate and rhythm with S1, S2  murmur. pulses are 2+ extrem.   Neck: No carotid bruits. No lymphadenopathy.  JVD. Abdomen: Bowel sounds present, abdomen soft and non-tender.   Msk:  No spine or cva tenderness. No weakness, no joint deformities or effusions. Extremities: No clubbing or cyanosis.  edema.  Neuro: Alert and oriented X 3. No focal deficits noted. Psych:  Good affect, responds appropriately Skin: Multiple bruises on his legs.   Labs:   Lab Results  Component Value Date   WBC 20.0* 11/14/2014   HGB 15.4 11/14/2014   HCT 43.6 11/14/2014   MCV 92.0 11/14/2014   PLT 112* 11/14/2014     Recent Labs Lab 11/14/14 0953  NA 130*  K 3.8  CL 91*  CO2 20*  BUN 9  CREATININE 0.92  CALCIUM 8.0*  PROT 7.1  BILITOT 1.4*  ALKPHOS 151*  ALT 86*  AST 156*  GLUCOSE 273*  ALBUMIN 3.7   MAGNESIUM  Date Value Ref Range Status  11/14/2014 1.3* 1.7 - 2.4 mg/dL Final    Recent Labs  16/10/96 0953  TROPONINI <0.03    B NATRIURETIC PEPTIDE  Date/Time Value Ref Range Status  11/14/2014 09:53 AM 49.7 0.0 - 100.0 pg/mL Final  07/18/2014 12:18 AM 554.8* 0.0 - 100.0 pg/mL Final   TSH  Date/Time Value Ref Range Status  11/30/2013 03:30 AM 1.710 0.350 - 4.500 uIU/mL Final    Echo: 04/16/2014 - Left ventricle: The cavity size was normal. There was mild concentric hypertrophy. Systolic function was normal. The estimated ejection fraction was in the  range of 55% to 60%. - Left atrium: The atrium was moderately dilated.  ECG: 11/14/2014 Atrial fib  Vent. rate 129 BPM PR interval * ms QRS duration 138 ms QT/QTc 386/565 ms P-R-T axes * 134 28  Radiology:  Ct Head Wo Contrast 11/14/2014   CLINICAL DATA:  Pain following fall.  Ethanol intoxication.  EXAM: CT HEAD WITHOUT CONTRAST  TECHNIQUE: Contiguous axial images were obtained from the base of the skull through the vertex without intravenous contrast.  COMPARISON:  November 08, 2014  FINDINGS: There is age related volume loss. There is no intracranial mass, hemorrhage, extra-axial fluid collection, or midline shift. Gray-white compartments appear normal. No acute infarct evident. The bony calvarium appears intact. The mastoid air cells are clear. There is mucosal thickening in the left maxillary antrum as well as in the ethmoid air cells bilaterally.  IMPRESSION: Age related volume loss. No intracranial mass, hemorrhage, or focal gray -white compartment lesions/acute appearing infarct. No extra-axial fluid collection. There are areas of paranasal sinus disease.   Electronically Signed   By: Bretta Bang III M.D.   On: 11/14/2014 14:12   Dg Chest Portable 1 View 11/14/2014   CLINICAL DATA:  Weakness, CHF, smoker  EXAM: PORTABLE CHEST 1 VIEW  COMPARISON:  11/09/2014  FINDINGS: Mild bilateral interstitial prominence likely chronic. There is no focal parenchymal opacity. There is no pleural effusion or pneumothorax. The heart and mediastinal contours are unremarkable.  The osseous structures are unremarkable.  IMPRESSION: No active disease.   Electronically Signed   By: Elige Ko   On: 11/14/2014 10:00    ASSESSMENT AND PLAN:   The patient was seen today by Dr Antoine Poche, the patient evaluated and the data reviewed.  Active Problems:   HTN (hypertension)   Chronic diastolic heart failure (HCC)   Alcohol abuse   Sepsis (  HCC)   SignedLeanna Battles 11/14/2014 4:52 PM Beeper  [phone]  Co-Sign MD  History and all data above reviewed.  Patient examined.  I agree with the findings as above. The patient has severe weakness with frequent falls in part related to alcoholism.  He has not had atrial fib in the past. He has had a nonischemic cardiomyopathy related likely to ETOH in the past.  At one point he wore a lifevest for VT.  However, follow up echoes demonstrated improvement in the EF.  On presentation today he is found to be in atrial fib with RVR.  He has an elevated lactic acid level and WBC.  He is being treated for possible sepsis without a clear source.   The patient exam reveals:  Very disheveled, chronically ill appearing, HWY:[id number]  ,  Lungs: Clear  ,  Abd: Positive bowel sounds, no rebound no guarding, very large umbilical hernia.  Ext No edema   .  All available labs, radiology testing, previous records reviewed. Agree with documented assessment and plan (exam modified). Atrial fib:  Secondary to an underlying process possibly of sepsis (possibly related to a pulmonary process as he has had worsening cough with brown sputum).  Also acute ETOH. Discussed with the patient and his wife.  He would not be an anticoagulation candidate with his frequent falls.  For now rate control with IV Cardizem and I have asked the nurses to increase the rate.  Eventually switch to PO.  No plan for cardioversion without being able to use anticoagulation.  He need treatment of his alcoholism.    Kemarion Maggie [name]  5:47 PM  11/14/2014

## 2014-11-14 NOTE — H&P (Addendum)
PCP:   Renold Don, MD   Chief Complaint:  Fall  HPI: 64 year old male who   has a past medical history of CHF (congestive heart failure) (HCC) (10/2010); Alcoholism (HCC); Allergy; Anxiety; Depression; Asthma; Hyperlipidemia; Hypertension; Seizures (HCC); and COPD (chronic obstructive pulmonary disease) (HCC).  Today was brought to the hospital after patient was found lying on the porch. Patient has a history of chronic alcohol abuse  and had multiple falls recently. Patient complains of shortness of breath. He has a history of bronchiectasis. Denies chest pain. As per wife patient fell few days ago and fell on the face, hitting his head. Patient was prescribed prednisone taper on 11/09/2014 for shortness of breath. In the ED patient found to be hypotensive, tachycardic, with leukocytosis WBC 20,000.   Allergies:  No Known Allergies    Past Medical History  Diagnosis Date  . CHF (congestive heart failure) (HCC) 10/2010    ECHO:  EF 40%, Grade II diastolic dysfunction  . Alcoholism (HCC)   . Allergy     seasonal   . Anxiety   . Depression   . Asthma   . Hyperlipidemia   . Hypertension   . Seizures (HCC)   . COPD (chronic obstructive pulmonary disease) [facility])     Past Surgical History  Procedure Laterality Date  . Toenail excision Right 1967    Removal of ingrown toenail [Other]    Prior to Admission medications   Medication Sig Start Date End Date Taking? Authorizing Provider  acetaminophen (TYLENOL) 500 MG tablet Take 1,500 mg by mouth every 6 (six) hours as needed for headache (headache).    Yes Historical Provider, MD  albuterol (PROVENTIL) (2.5 MG/3ML) 0.083% nebulizer solution USE 3 MLS BY NEBULIZATION EVERY 6 HOURS AS NEEDED FOR WHEEZING OR SHORTNESS OF BREATH 10/20/14  Yes Tobey Grim, MD  aspirin EC 81 MG EC tablet Take 1 tablet (81 mg total) by mouth daily. 11/13/12  Yes Renae Fickle, MD  atorvastatin (LIPITOR) 40 MG tablet Take 1 tablet (40 mg total) by  mouth daily at 6 PM. 03/17/14  Yes Chrystie Nose, MD  B-Complex CAPS Take 1 capsule by mouth daily.   Yes Historical Provider, MD  carvedilol (COREG) 25 MG tablet Take 1 tablet (25 mg total) by mouth 2 (two) times daily with a meal. NEED OV. 09/05/14  Yes Chrystie Nose, MD  clonazePAM (KLONOPIN) 0.5 MG tablet TAKE 1 TABLET BY MOUTH TWICE A DAY AS NEEDED FOR ANXIETY 10/17/14  Yes Tobey Grim, MD  COMBIVENT RESPIMAT 20-100 MCG/ACT AERS respimat INHALE TWO PUFFS BY MOUTH EVERY FOUR HOURS AS NEEDED FOR WHEEZING 10/14/14  Yes Tobey Grim, MD  DULoxetine (CYMBALTA) 30 MG capsule TAKE ONE CAPSULE BY MOUTH ONE TIME DAILY 07/27/14  Yes Uvaldo Rising, MD  ePHEDrine-GuaiFENesin (PRIMATENE ASTHMA) 12.5-200 MG TABS Take 25 mg by mouth as needed (difficulty breathing).    Yes Historical Provider, MD  fluticasone Aleda Grana) 50 MCG/ACT nasal spray Use two sprays in each nostril daily 11/08/13  Yes Tobey Grim, MD  furosemide (LASIX) 40 MG tablet Take 1 tablet (40 mg total) by mouth daily. 03/17/14  Yes Chrystie Nose, MD  gabapentin (NEURONTIN) 300 MG capsule TAKE ONE CAPSULE BY MOUTH THREE TIMES DAILY  03/17/14  Yes Tobey Grim, MD  lisinopril (PRINIVIL,ZESTRIL) 5 MG tablet TAKE 1 TABLET BY MOUTH EVERY DAY 08/05/14  Yes Tobey Grim, MD  Magnesium 500 MG TABS Take 500 mg by mouth daily.  Yes Historical Provider, MD  montelukast (SINGULAIR) 10 MG tablet TAKE ONE TABLET BY MOUTH NIGHTLY AT BEDTIME 10/17/14  Yes Tobey Grim, MD  Multiple Vitamin (MULTIVITAMIN WITH MINERALS) TABS tablet Take 1 tablet by mouth daily.   Yes Historical Provider, MD  predniSONE (DELTASONE) 10 MG tablet Take 6 tablets (60 mg total) by mouth daily. 11/09/14  Yes Derwood Kaplan, MD  sodium chloride 1 G tablet Take 1 g by mouth 2 (two) times daily with a meal.   Yes Historical Provider, MD  spironolactone (ALDACTONE) 25 MG tablet TAKE HALF TABLET BY MOUTH DAILY  03/17/14  Yes Chrystie Nose, MD  tiotropium (SPIRIVA) 18 MCG  inhalation capsule Place 1 capsule (18 mcg total) into inhaler and inhale daily. 03/16/14  Yes Tobey Grim, MD  triamcinolone (KENALOG) 0.025 % ointment Apply 1 application topically 2 (two) times daily. Patient taking differently: Apply 1 application topically 2 (two) times daily as needed (for rash).  10/26/13  Yes Tobey Grim, MD  Vitamin D, Ergocalciferol, (DRISDOL) 50000 UNITS CAPS capsule TAKE 1 CAPSULE BY MOUTH EVERY 7 DAYS. 06/06/14  Yes Tobey Grim, MD  potassium chloride SA (K-DUR,KLOR-CON) 20 MEQ tablet Take 2 tablets (40 mEq total) by mouth daily. 03/17/14   Chrystie Nose, MD    Social History:  reports that he has been smoking Cigarettes.  He has a 50 pack-year smoking history. He has never used smokeless tobacco. He reports that he drinks alcohol. He reports that he does not use illicit drugs.  Family History  Problem Relation Age of Onset  . Alzheimer's disease Mother   . Diabetes Mother   . Heart disease Maternal Uncle      All the positives are listed in BOLD  Review of Systems:  HEENT: Headache, blurred vision, runny nose, sore throat Neck: Hypothyroidism, hyperthyroidism,,lymphadenopathy Chest : Shortness of breath, history of COPD, Asthma Heart : Chest pain, history of coronary arterey disease GI:  Nausea, vomiting, diarrhea, constipation, GERD GU: Dysuria, urgency, frequency of urination, hematuria Neuro: Stroke, seizures, syncope Psych: Depression, anxiety, hallucinations   Physical Exam: Blood pressure 162/91, pulse 125, temperature 98.3 F (36.8 C), temperature source Oral, resp. rate 26, SpO2 93 %. Constitutional:   Patient is a well-developed and well-nourished *male in no acute distress and cooperative with exam. Head: Normocephalic and atraumatic Mouth: Mucus membranes moist Eyes: PERRL, EOMI, conjunctivae normal Neck: Supple, No Thyromegaly Cardiovascular: RRR, S1 normal, S2 normal Pulmonary/Chest: CTAB, no wheezes, rales, or  rhonchi Abdominal: Soft. Non-tender, distended, umbilical hernia, bowel sounds are normal, no masses, organomegaly, or guarding present.  Neurological: A&O x3, Strength is normal and symmetric bilaterally, cranial nerve II-XII are grossly intact, no focal motor deficit, sensory intact to light touch bilaterally.  Extremities : No Cyanosis, Clubbing or Edema  Labs on Admission:  Basic Metabolic Panel:  Recent Labs Lab 11/08/14 2307 11/14/14 0953  NA 136 130*  K 4.3 3.8  CL 102 91*  CO2 22 20*  GLUCOSE 143* 273*  BUN <5* 9  CREATININE 0.86 0.92  CALCIUM 8.4* 8.0*  MG  --  1.3*   Liver Function Tests:  Recent Labs Lab 11/08/14 2307 11/14/14 0953  AST 117* 156*  ALT 62 86*  ALKPHOS 159* 151*  BILITOT 0.5 1.4*  PROT 7.1 7.1  ALBUMIN 3.6 3.7   No results for input(s): LIPASE, AMYLASE in the last 168 hours. No results for input(s): AMMONIA in the last 168 hours. CBC:  Recent Labs Lab 11/08/14 2307  11/14/14 0953  WBC 4.9 20.0*  NEUTROABS 2.8 18.2*  HGB 13.9 15.4  HCT 41.7 43.6  MCV 93.9 92.0  PLT 167 112*   Cardiac Enzymes:  Recent Labs Lab 11/14/14 0953  TROPONINI <0.03    BNP (last 3 results)  Recent Labs  06/26/14 1200 07/18/14 0018 11/14/14 0953  BNP 574.4* 554.8* 49.7    Radiological Exams on Admission: Ct Head Wo Contrast  11/14/2014   CLINICAL DATA:  Pain following fall.  Ethanol intoxication.  EXAM: CT HEAD WITHOUT CONTRAST  TECHNIQUE: Contiguous axial images were obtained from the base of the skull through the vertex without intravenous contrast.  COMPARISON:  November 08, 2014  FINDINGS: There is age related volume loss. There is no intracranial mass, hemorrhage, extra-axial fluid collection, or midline shift. Gray-white compartments appear normal. No acute infarct evident. The bony calvarium appears intact. The mastoid air cells are clear. There is mucosal thickening in the left maxillary antrum as well as in the ethmoid air cells bilaterally.   IMPRESSION: Age related volume loss. No intracranial mass, hemorrhage, or focal gray -white compartment lesions/acute appearing infarct. No extra-axial fluid collection. There are areas of paranasal sinus disease.   Electronically Signed   By: Bretta Bang III M.D.   On: 11/14/2014 14:12   Dg Chest Portable 1 View  11/14/2014   CLINICAL DATA:  Weakness, CHF, smoker  EXAM: PORTABLE CHEST 1 VIEW  COMPARISON:  11/09/2014  FINDINGS: Mild bilateral interstitial prominence likely chronic. There is no focal parenchymal opacity. There is no pleural effusion or pneumothorax. The heart and mediastinal contours are unremarkable.  The osseous structures are unremarkable.  IMPRESSION: No active disease.   Electronically Signed   By: Elige Ko   On: 11/14/2014 10:00    EKG: Independently reviewed. Atrial fibrillation   Assessment/Plan Active Problems:   HTN (hypertension)   Chronic diastolic heart failure (HCC)   Alcohol abuse   Sepsis (HCC)   Hyponatremia   Sepsis Patient presenting with leukocytosis, hypotension, tachycardia no clear source of infection. Empirically start on vancomycin and Zosyn. Follow blood cultures and urine culture. Will check lactate. Patient was on high-dose prednisone taper which was started 5 days ago, will hold prednisone and start Solu-Cortef 50 mg IV every 12 hours  Hyperglycemia Secondary to steroids,  start the patient on sliding-scale insulin with NovoLog.  Alcohol intoxication Patient presented with alcohol intoxication. We'll start CIWA protocol. Thiamine and folate.  Alcohol ketoacidosis Patient came with alcoholic ketoacidosis with anion gap of 19 Continue IV fluids. Will repeat BMP.  Recurrent fall Patient has a recurrent fall , likely from alcohol abuse. CT head negative for any intracranial bleed Will need PT evaluation once patient more stable.  Atrial fibrillation Patient EKG shows A. fib with RVR, start Cardizem infusion. Will obtain  cardiology consultation.  Hyponatremia Patient sodium level is 130, will check serum and urine osmolality Continue IV fluids. Follow BMP in a.m.  DVT prophylaxis Lovenox   Code status: Full code  Family discussion: Admission, patients condition and plan of care including tests being ordered have been discussed with the patient and his wife at bedside* who indicate understanding and agree with the plan and Code Status.   Time Spent on Admission: 60 min  Kameo Bains S [facility] Pager: [phone] 11/14/2014, 4:15 PM  If 7PM-7AM, please contact night-coverage  [email or web address]  Password [email or web address]

## 2014-11-14 NOTE — ED Notes (Signed)
Per EMS pt called EMS for fall, weakness. Pt intoxicated with ETOH, which is his normal per EMS. Per EMS pt calls EMS somewhat frequently for the same, but that he usually does not want to go to the hospital. Pt c/o weakness, general illness, fatigue, can bear weight with assistance but not alone, pt states he "feels weird." Pt states he drinks bourbon every day but unable to give specific amount, last drink this morning. Pt non-compliant with medications. Pt has hx of CHF, seizures, COPD.

## 2014-11-14 NOTE — Progress Notes (Signed)
Pt stated that he drinks half a bottle of bourbon a day.

## 2014-11-14 NOTE — ED Notes (Signed)
Bed: YT01 Expected date:  Expected time:  Means of arrival:  Comments: EMS- 64yo M, fall/lethargic

## 2014-11-14 NOTE — Progress Notes (Signed)
CRITICAL VALUE ALERT  Critical value received:  Lactic Acid 3.7  Date of notification:  11/14/2014  Time of notification:  2046  Critical value read back:Yes.    Nurse who received alert:  Bosie Helper, RN  MD notified (1st page):  K.Kirby   Time of first page:  2046  MD notified (2nd page):  Time of second page:  Responding MD:    Time MD responded:

## 2014-11-14 NOTE — ED Notes (Signed)
Patient attempted to void into urinal without success.

## 2014-11-14 NOTE — ED Notes (Signed)
Patient transported to CT 

## 2014-11-15 ENCOUNTER — Other Ambulatory Visit (HOSPITAL_COMMUNITY): Payer: Self-pay

## 2014-11-15 DIAGNOSIS — E872 Acidosis: Secondary | ICD-10-CM

## 2014-11-15 DIAGNOSIS — F1020 Alcohol dependence, uncomplicated: Secondary | ICD-10-CM

## 2014-11-15 DIAGNOSIS — I4891 Unspecified atrial fibrillation: Secondary | ICD-10-CM

## 2014-11-15 DIAGNOSIS — W19XXXD Unspecified fall, subsequent encounter: Secondary | ICD-10-CM

## 2014-11-15 LAB — COMPREHENSIVE METABOLIC PANEL
ALBUMIN: 3 g/dL — AB (ref 3.5–5.0)
ALK PHOS: 107 U/L (ref 38–126)
ALT: 65 U/L — AB (ref 17–63)
AST: 122 U/L — AB (ref 15–41)
Anion gap: 10 (ref 5–15)
BILIRUBIN TOTAL: 2.1 mg/dL — AB (ref 0.3–1.2)
BUN: 12 mg/dL (ref 6–20)
CO2: 24 mmol/L (ref 22–32)
CREATININE: 0.92 mg/dL (ref 0.61–1.24)
Calcium: 7.2 mg/dL — ABNORMAL LOW (ref 8.9–10.3)
Chloride: 101 mmol/L (ref 101–111)
GFR calc Af Amer: >60 mL/min (ref >60)
GFR calc non Af Amer: >60 mL/min (ref >60)
GLUCOSE: 137 mg/dL — AB (ref 65–99)
POTASSIUM: 3.5 mmol/L (ref 3.5–5.1)
Sodium: 135 mmol/L (ref 135–145)
TOTAL PROTEIN: 6.1 g/dL — AB (ref 6.5–8.1)

## 2014-11-15 LAB — HEMOGLOBIN A1C
HEMOGLOBIN A1C: 6.6 % — AB (ref 4.8–5.6)
MEAN PLASMA GLUCOSE: 143 mg/dL

## 2014-11-15 LAB — GLUCOSE, CAPILLARY
GLUCOSE-CAPILLARY: 123 mg/dL — AB (ref 65–99)
GLUCOSE-CAPILLARY: 125 mg/dL — AB (ref 65–99)
Glucose-Capillary: 196 mg/dL — ABNORMAL HIGH (ref 65–99)

## 2014-11-15 LAB — CBC
HEMATOCRIT: 34.9 % — AB (ref 39.0–52.0)
HEMOGLOBIN: 12 g/dL — AB (ref 13.0–17.0)
MCH: 31.8 pg (ref 26.0–34.0)
MCHC: 34.4 g/dL (ref 30.0–36.0)
MCV: 92.6 fL (ref 78.0–100.0)
Platelets: 84 10*3/uL — ABNORMAL LOW (ref 150–400)
RBC: 3.77 MIL/uL — AB (ref 4.22–5.81)
RDW: 16 % — AB (ref 11.5–15.5)
WBC: 18.7 10*3/uL — AB (ref 4.0–10.5)

## 2014-11-15 LAB — OSMOLALITY, URINE: Osmolality, Ur: 326 mOsm/kg — ABNORMAL LOW (ref 390–1090)

## 2014-11-15 LAB — OSMOLALITY: OSMOLALITY: 305 mosm/kg — AB (ref 275–300)

## 2014-11-15 LAB — URINE CULTURE: Culture: NO GROWTH

## 2014-11-15 MED ORDER — DILTIAZEM HCL 30 MG PO TABS
30.0000 mg | ORAL_TABLET | Freq: Four times a day (QID) | ORAL | Status: DC
  Administered 2014-11-15 – 2014-11-18 (×12): 30 mg via ORAL
  Filled 2014-11-15 (×17): qty 1

## 2014-11-15 MED ORDER — ONDANSETRON HCL 4 MG/2ML IJ SOLN: 4.0000 mg | Freq: Four times a day (QID) | INTRAMUSCULAR | Status: DC | PRN

## 2014-11-15 MED ORDER — TRAMADOL HCL 50 MG PO TABS
50.0000 mg | ORAL_TABLET | Freq: Four times a day (QID) | ORAL | Status: AC | PRN
  Administered 2014-11-18 – 2014-11-19 (×2): 50 mg via ORAL
  Filled 2014-11-15 (×3): qty 1

## 2014-11-15 MED ORDER — CIPROFLOXACIN HCL 0.3 % OP SOLN
1.0000 [drp] | OPHTHALMIC | Status: DC
  Administered 2014-11-15 – 2014-11-22 (×54): 1 [drp] via OPHTHALMIC
  Filled 2014-11-15 (×2): qty 2.5

## 2014-11-15 NOTE — Care Management Note (Signed)
Case Management Note  Patient Details  Name: Juan [name] MRN: [id number] Date of Birth: [birth year]  Subjective/Objective:                 etoh intoxication and unknown onset of a.fib   Action/Plan:  Follow for level of care and needs   Expected Discharge Date:                  Expected Discharge Plan:  Home/Self Care  In-House Referral:  Clinical Social Work  Discharge planning Services  CM Consult  Post Acute Care Choice:    Choice offered to:     DME Arranged:    DME Agency:     HH Arranged:    HH Agency:     Status of Service:  In process, will continue to follow  Medicare Important Message Given:    Date Medicare IM Given:    Medicare IM give by:    Date Additional Medicare IM Given:    Additional Medicare Important Message give by:     If discussed at Long Length of Stay Meetings, dates discussed:    Additional Comments:  Golda Acre, RN 11/15/2014, 12:53 PM

## 2014-11-15 NOTE — Progress Notes (Signed)
Dr Sharl Ma notifed that blood culture collected 11/14/2014 -anerobic bottle is growing gram positive cocci in clusters.

## 2014-11-15 NOTE — Plan of Care (Signed)
Problem: Phase I Progression Outcomes Goal: Hemodynamically stable Outcome: Progressing Remains on cardizem drip .

## 2014-11-15 NOTE — Progress Notes (Signed)
Patient Name: Juan [name] Date of Encounter: 11/15/2014     Active Problems:   HTN (hypertension)   Chronic diastolic heart failure (HCC)   Alcohol abuse   Sepsis (HCC)   Atrial fibrillation with rapid ventricular response (HCC)    SUBJECTIVE  Has some pain and pressure in his belly. He had a BM in the bed. Difficult to understand. Babbling about his "babies" which his wife tells me are his cats. Wife here and says he is really out of it currently due to ativan.   CURRENT MEDS . antiseptic oral rinse  7 mL Mouth Rinse BID  . aspirin EC  81 mg Oral Daily  . Chlorhexidine Gluconate Cloth  6 each Topical Q0600  . DULoxetine  30 mg Oral Daily  . enoxaparin (LOVENOX) injection  40 mg Subcutaneous Q24H  . folic acid  1 mg Oral Daily  . hydrocortisone sod succinate (SOLU-CORTEF) inj  50 mg Intravenous BID  . insulin aspart  0-9 Units Subcutaneous TID WC  . LORazepam  0-4 mg Intravenous Q4H   Followed by  . [START ON 11/17/2014] LORazepam  0-4 mg Intravenous Q12H  . multivitamin with minerals  1 tablet Oral Daily  . mupirocin ointment  1 application Nasal BID  . piperacillin-tazobactam (ZOSYN)  IV  3.375 g Intravenous 3 times per day  . thiamine  100 mg Oral Daily   Or  . thiamine  100 mg Intravenous Daily  . vancomycin  1,250 mg Intravenous Q12H    OBJECTIVE  Filed Vitals:   11/15/14 0300 11/15/14 0400 11/15/14 0500 11/15/14 0600  BP: 112/68 118/62 111/79 111/63  Pulse: 110 110 106 106  Temp:  99.3 F (37.4 C)    TempSrc:  Oral    Resp: Height:      Weight:      SpO2: 93% 94% 94% 97%    Intake/Output Summary (Last 24 hours) at 11/15/14 0756 Last data filed at 11/15/14 0555  Gross per 24 hour  Intake 2215.17 ml  Output   1500 ml  Net 715.17 ml   Filed Weights   11/14/14 1500  Weight: 179 lb 14.3 oz (81.6 kg)    PHYSICAL EXAM  General: Very disheveled, chronically ill appearing, NAD. Neuro: Alert and oriented X 3. Moves all extremities  spontaneously. Psych: Normal affect. HEENT:  Normal  Neck: Supple without bruits or JVD. Lungs:  Resp regular and unlabored, CTA. Heart: irreg irreg. no s3, s4, or murmurs. Abdomen: Soft, non-tender, non-distended, BS + x 4.  Large hernia noted.  Extremities: No clubbing, cyanosis or edema. DP/PT/Radials 2+ and equal bilaterally.  Accessory Clinical Findings  CBC  Recent Labs  11/14/14 0953 11/15/14 0413  WBC 20.0* 18.7*  NEUTROABS 18.2*  --   HGB 15.4 12.0*  HCT 43.6 34.9*  MCV 92.0 92.6  PLT 112* 84*   Basic Metabolic Panel  Recent Labs  11/14/14 0953 11/14/14 1650 11/15/14 0413  NA 130* 130* 135  K 3.8 2.9* 3.5  CL 91* 95* 101  CO2 20* 19* 24  GLUCOSE 273* 242* 137*  BUN CREATININE 0.92 0.76 0.92  CALCIUM 8.0* 7.2* 7.2*  MG 1.3*  --   --    Liver Function Tests  Recent Labs  11/14/14 0953 11/15/14 0413  AST 156* 122*  ALT 86* 65*  ALKPHOS 151* 107  BILITOT 1.4* 2.1*  PROT 7.1 6.1*  ALBUMIN 3.7 3.0*   No results for  input(s): LIPASE, AMYLASE in the last 72 hours. Cardiac Enzymes  Recent Labs  11/14/14 0953  TROPONINI <0.03   Hemoglobin A1C  Recent Labs  11/14/14 0953  HGBA1C 6.6*    TELE  afib with CVR in 80-90s. PVCs.   Radiology/Studies  Dg Chest 1 View  11/09/2014   CLINICAL DATA:  Acute onset of shortness of breath. Initial encounter.  EXAM: CHEST 1 VIEW  COMPARISON:  Chest radiograph performed 07/18/2014  FINDINGS: The lungs are well-aerated. Mild vascular congestion is noted. Mild left basilar atelectasis is seen. There is no evidence of pleural effusion or pneumothorax.  The cardiomediastinal silhouette is within normal limits. No acute osseous abnormalities are seen.  IMPRESSION: Mild vascular congestion noted.  Mild left basilar atelectasis seen.   Electronically Signed   By: Roanna Raider M.D.   On: 11/09/2014 02:52   Ct Head Wo Contrast  11/14/2014   CLINICAL DATA:  Pain following fall.  Ethanol intoxication.   EXAM: CT HEAD WITHOUT CONTRAST  TECHNIQUE: Contiguous axial images were obtained from the base of the skull through the vertex without intravenous contrast.  COMPARISON:  November 08, 2014  FINDINGS: There is age related volume loss. There is no intracranial mass, hemorrhage, extra-axial fluid collection, or midline shift. Gray-white compartments appear normal. No acute infarct evident. The bony calvarium appears intact. The mastoid air cells are clear. There is mucosal thickening in the left maxillary antrum as well as in the ethmoid air cells bilaterally.  IMPRESSION: Age related volume loss. No intracranial mass, hemorrhage, or focal gray -white compartment lesions/acute appearing infarct. No extra-axial fluid collection. There are areas of paranasal sinus disease.   Electronically Signed   By: Bretta Bang III M.D.   On: 11/14/2014 14:12   Ct Head Wo Contrast  11/09/2014   CLINICAL DATA:  Tripped and fell at home, with bruising at the nose and upper lip swelling. Initial encounter.  EXAM: CT HEAD WITHOUT CONTRAST  CT MAXILLOFACIAL WITHOUT CONTRAST  TECHNIQUE: Multidetector CT imaging of the head and maxillofacial structures were performed using the standard protocol without intravenous contrast. Multiplanar CT image reconstructions of the maxillofacial structures were also generated.  COMPARISON:  CT of the head performed 04/15/2014  FINDINGS: CT HEAD FINDINGS  There is no evidence of acute infarction, mass lesion, or intra- or extra-axial hemorrhage on CT.  The posterior fossa, including the cerebellum, brainstem and fourth ventricle, is within normal limits. The third and lateral ventricles, and basal ganglia are unremarkable in appearance. The cerebral hemispheres are symmetric in appearance, with normal gray-white differentiation. No mass effect or midline shift is seen.  There is no evidence of fracture; visualized osseous structures are unremarkable in appearance. The visualized portions of the  orbits are within normal limits. Mucoperiosteal thickening is noted at the left maxillary sinus, and mild mucosal thickening is seen at the right maxillary sinus. The remaining paranasal sinuses and mastoid air cells are well-aerated. No significant soft tissue abnormalities are seen.  CT MAXILLOFACIAL FINDINGS  There is no evidence of fracture or dislocation. The maxilla and mandible appear intact. The nasal bone is unremarkable in appearance. Numerous maxillary and mandibular dental caries are noted.  The orbits are intact bilaterally. Mucoperiosteal thickening is noted at the left maxillary sinus, and mild mucosal thickening is seen at the right maxillary sinus. The remaining visualized paranasal sinuses and mastoid air cells are well-aerated.  Calcification is noted at the carotid bifurcations bilaterally. The parapharyngeal fat planes are preserved. The nasopharynx, oropharynx and  hypopharynx are unremarkable in appearance. The visualized portions of the valleculae and piriform sinuses are grossly unremarkable.  The parotid and submandibular glands are within normal limits. No cervical lymphadenopathy is seen.  IMPRESSION: 1. No evidence of traumatic intracranial injury or fracture. 2. No evidence of fracture or dislocation with regard to the maxillofacial structures. 3. Numerous maxillary and mandibular dental caries noted. 4. Mucoperiosteal thickening at the left maxillary sinus, and mild mucosal thickening at the right maxillary sinus. 5. Calcification at the carotid bifurcations bilaterally. Carotid ultrasound would be helpful for further evaluation, when and as deemed clinically appropriate.   Electronically Signed   By: Roanna Raider M.D.   On: 11/09/2014 00:54   Dg Chest Portable 1 View  11/14/2014   CLINICAL DATA:  Weakness, CHF, smoker  EXAM: PORTABLE CHEST 1 VIEW  COMPARISON:  11/09/2014  FINDINGS: Mild bilateral interstitial prominence likely chronic. There is no focal parenchymal opacity. There  is no pleural effusion or pneumothorax. The heart and mediastinal contours are unremarkable.  The osseous structures are unremarkable.  IMPRESSION: No active disease.   Electronically Signed   By: Elige Ko   On: 11/14/2014 10:00   Ct Maxillofacial Wo Cm  11/09/2014   CLINICAL DATA:  Tripped and fell at home, with bruising at the nose and upper lip swelling. Initial encounter.  EXAM: CT HEAD WITHOUT CONTRAST  CT MAXILLOFACIAL WITHOUT CONTRAST  TECHNIQUE: Multidetector CT imaging of the head and maxillofacial structures were performed using the standard protocol without intravenous contrast. Multiplanar CT image reconstructions of the maxillofacial structures were also generated.  COMPARISON:  CT of the head performed 04/15/2014  FINDINGS: CT HEAD FINDINGS  There is no evidence of acute infarction, mass lesion, or intra- or extra-axial hemorrhage on CT.  The posterior fossa, including the cerebellum, brainstem and fourth ventricle, is within normal limits. The third and lateral ventricles, and basal ganglia are unremarkable in appearance. The cerebral hemispheres are symmetric in appearance, with normal gray-white differentiation. No mass effect or midline shift is seen.  There is no evidence of fracture; visualized osseous structures are unremarkable in appearance. The visualized portions of the orbits are within normal limits. Mucoperiosteal thickening is noted at the left maxillary sinus, and mild mucosal thickening is seen at the right maxillary sinus. The remaining paranasal sinuses and mastoid air cells are well-aerated. No significant soft tissue abnormalities are seen.  CT MAXILLOFACIAL FINDINGS  There is no evidence of fracture or dislocation. The maxilla and mandible appear intact. The nasal bone is unremarkable in appearance. Numerous maxillary and mandibular dental caries are noted.  The orbits are intact bilaterally. Mucoperiosteal thickening is noted at the left maxillary sinus, and mild mucosal  thickening is seen at the right maxillary sinus. The remaining visualized paranasal sinuses and mastoid air cells are well-aerated.  Calcification is noted at the carotid bifurcations bilaterally. The parapharyngeal fat planes are preserved. The nasopharynx, oropharynx and hypopharynx are unremarkable in appearance. The visualized portions of the valleculae and piriform sinuses are grossly unremarkable.  The parotid and submandibular glands are within normal limits. No cervical lymphadenopathy is seen.  IMPRESSION: 1. No evidence of traumatic intracranial injury or fracture. 2. No evidence of fracture or dislocation with regard to the maxillofacial structures. 3. Numerous maxillary and mandibular dental caries noted. 4. Mucoperiosteal thickening at the left maxillary sinus, and mild mucosal thickening at the right maxillary sinus. 5. Calcification at the carotid bifurcations bilaterally. Carotid ultrasound would be helpful for further evaluation, when and as deemed  clinically appropriate.   Electronically Signed   By: Roanna Raider M.D.   On: 11/09/2014 00:54    ASSESSMENT AND PLAN  CORD WILCZYNSKI is a 64 y.o. year old male with a history of NICM felt 2nd ETOH (30-35% in 2014, then normalized), NSVT 2014 in the setting of ETOH w/d and seizures, bifasicular block at baseline, COPD, tobacco abuse and ongoing alcohol abuse w/ freq falls who was admitted to [facility] on 11/14/14 for new onset afib with RVR and possible sepsis after being found passed out on porch.  Sepsis- He has an elevated lactic acid level and WBC. He is being treated for possible sepsis without a clear source.  Afib with RVR- much better rate control on cardizem gtt ( was going @ 20 mg/ hr when i walked in room). Will decrease down to 15mg /hr and hopefully transition to oral today. Hopefully this will improve with treatment of sepsis. -- 2D ECHO pending -- Not an anticoagulation candidate with his frequent falls.No plan for cardioversion  without being able to use anticoagulation. He need treatment of his alcoholism.   Billy Fischer PA-C  Pager [phone]  History and all data above reviewed.  Blood cultures positive for Gram positive cocci.  Patient examined.  I agree with the findings as above. Confused.   The patient exam reveals AVW:[id number]  ,  Lungs: Decreased breath sounds  ,  Abd: Positive bowel sounds, no rebound no guarding, Ext no edema  .  All available labs, radiology testing, previous records reviewed. Agree with documented assessment and plan. OK to switch to Cardizem PO.    Buddy Nyomi Howser  11:17 AM  11/15/2014

## 2014-11-15 NOTE — Progress Notes (Signed)
CSW received consult for substance abuse. Per nurse, pt actively in dts requiring ativan every 2 hours. CSW to assess patient at later time when able to engage fully in assessment and discussion of resources available. Pt rn, shared wife is concerned about pt frequent falls secondary to alcohol use. CSW to asses further. Pt may benefit form pt/ot evaluation.   Olga Coaster, LCSW  Clinical Social Work  [employer] [phone]

## 2014-11-15 NOTE — Progress Notes (Signed)
[facility] PROGRESS NOTE  Juan [name] HQI:[id number] DOB: [birth year] DOA: 11/14/2014 PCP: Renold Don, MD  Assessment/Plan:  Sepsis Resolved Patient presented with leukocytosis, hypotension, tachycardia no clear source of infection. Empirically started on vancomycin and Zosyn. One out of 2 bottles blood cultures growing gram-positive cocci in clusters. Final result pending. UA was clear, chest x-ray shows no pneumonia. Urine culture is pending. Patient was on high-dose prednisone taper which was started 5 days ago, held prednisone and started Solu-Cortef 50 mg IV every 12 hours  Hyperglycemia Secondary to steroids, started the patient on sliding-scale insulin with NovoLog. Glucose is well controlled  Alcohol intoxication Patient presented with alcohol intoxication.  CIWA protocol. Thiamine and folate.  Alcohol ketoacidosis Resolved Patient came with alcoholic ketoacidosis with anion gap of 19 After IV fluids anion  gap is closed  Recurrent fall Patient has a recurrent fall , likely from alcohol abuse. CT head negative for any intracranial bleed Will need PT evaluation once patient more stable.  Atrial fibrillation Patient EKG shows A. fib with RVR, started Cardizem infusion. Patient was seen by cardiology, and deemed not a candidate for anticoagulation. Continue Cardizem infusion.  Hyponatremia Resolved, today sodium is 135 Patient came with sodium of 130, urine osmolality is 326, serum osmolarity 305  Nutrition  Will advance the diet as tolerated.  Code Status: Full code Family Communication: Discussed with wife at bedside Disposition Plan: To be decided based on clinical improvement   Consultants:  Cardiology  Procedures:  None  Antibiotics:  Vancomycin  Zosyn  HPI/Subjective: 64 year old male who  has a past medical history of CHF (congestive heart failure) (HCC) (10/2010); Alcoholism (HCC); Allergy; Anxiety; Depression; Asthma;  Hyperlipidemia; Hypertension; Seizures (HCC); and COPD (chronic obstructive pulmonary disease) (HCC). Today was brought to the hospital after patient was found lying on the porch. Patient has a history of chronic alcohol abuse and had multiple falls recently. Patient complains of shortness of breath. He has a history of bronchiectasis. Denies chest pain. As per wife patient fell few days ago and fell on the face, hitting his head. Patient was prescribed prednisone taper on 11/09/2014 for shortness of breath. In the ED patient found to be hypotensive, tachycardic, with leukocytosis WBC 20,000  This morning patient feels better, denies tremor. One out of 2 bottles of blood cultures growing gram-positive cocci in clusters.   Objective: Filed Vitals:   11/15/14 1200  BP: 127/78  Pulse:   Temp:   Resp: 33    Intake/Output Summary (Last 24 hours) at 11/15/14 1258 Last data filed at 11/15/14 1100  Gross per 24 hour  Intake 2890.17 ml  Output   1650 ml  Net 1240.17 ml   Filed Weights   11/14/14 1500  Weight: 81.6 kg (179 lb 14.3 oz)    Exam:   General:  appears in no acute distress   Cardiovascular: S1-S2 irregular   Respiratory: Clear to auscultation bilaterally   Abdomen Soft, nontender, no organomegaly   Musculoskeletal:  no cyanosis/clubbing/edema of the lower extremities   Data Reviewed: Basic Metabolic Panel:  Recent Labs Lab 11/08/14 2307 11/14/14 0953 11/14/14 1650 11/15/14 0413  NA 136 130* 130* 135  K 4.3 3.8 2.9* 3.5  CL 102 91* 95* 101  CO2 22 20* 19* 24  GLUCOSE 143* 273* 242* 137*  BUN <5* CREATININE 0.86 0.92 0.76 0.92  CALCIUM 8.4* 8.0* 7.2* 7.2*  MG  --  1.3*  --   --    Liver Function Tests:  Recent Labs Lab 11/08/14 2307 11/14/14 0953 11/15/14 0413  AST 117* 156* 122*  ALT 62 86* 65*  ALKPHOS 159* 151* 107  BILITOT 0.5 1.4* 2.1*  PROT 7.1 7.1 6.1*  ALBUMIN 3.6 3.7 3.0*   No results for input(s): LIPASE, AMYLASE in the last  168 hours. No results for input(s): AMMONIA in the last 168 hours. CBC:  Recent Labs Lab 11/08/14 2307 11/14/14 0953 11/15/14 0413  WBC 4.9 20.0* 18.7*  NEUTROABS 2.8 18.2*  --   HGB 13.9 15.4 12.0*  HCT 41.7 43.6 34.9*  MCV 93.9 92.0 92.6  PLT 167 112* 84*   Cardiac Enzymes:  Recent Labs Lab 11/14/14 0953  TROPONINI <0.03   BNP (last 3 results)  Recent Labs  06/26/14 1200 07/18/14 0018 11/14/14 0953  BNP 574.4* 554.8* 49.7    ProBNP (last 3 results) No results for input(s): PROBNP in the last 8760 hours.  CBG:  Recent Labs Lab 11/14/14 1624 11/14/14 2141 11/15/14 0828  GLUCAP 243* 199* 123*    Recent Results (from the past 240 hour(s))  Culture, blood (routine x 2)     Status: None (Preliminary result)   Collection Time: 11/14/14  1:15 PM  Result Value Ref Range Status   Specimen Description BLOOD RIGHT HAND  Final   Special Requests BOTTLES DRAWN AEROBIC AND ANAEROBIC 5CC   Final   Culture  Setup Time   Final    GRAM POSITIVE COCCI IN CLUSTERS IN BOTH AEROBIC AND ANAEROBIC BOTTLES CRITICAL RESULT CALLED TO, READ BACK BY AND VERIFIED WITH: S [facility] 11/15/14 @ 1102 M VESTAL    Culture   Final    GRAM POSITIVE COCCI Performed at [facility]    Report Status PENDING  Incomplete  Culture, blood (routine x 2)     Status: None (Preliminary result)   Collection Time: 11/14/14  1:26 PM  Result Value Ref Range Status   Specimen Description BLOOD LEFT HAND  Final   Special Requests BOTTLES DRAWN AEROBIC AND ANAEROBIC 5CC   Final   Culture   Final    NO GROWTH < 24 HOURS Performed at [facility]    Report Status PENDING  Incomplete  MRSA PCR Screening     Status: Abnormal   Collection Time: 11/14/14  2:55 PM  Result Value Ref Range Status   MRSA by PCR POSITIVE (A) NEGATIVE Final    Comment:        The GeneXpert MRSA Assay (FDA approved for NASAL specimens only), is one component of a comprehensive MRSA colonization surveillance  program. It is not intended to diagnose MRSA infection nor to guide or monitor treatment for MRSA infections. RESULT CALLED TO, READ BACK BY AND VERIFIED WITH: LISA FREI,RN [id number] @ 1608 BY J SCOTTON   Urine culture     Status: None (Preliminary result)   Collection Time: 11/14/14  5:20 PM  Result Value Ref Range Status   Specimen Description URINE, RANDOM  Final   Special Requests NONE  Final   Culture   Final    NO GROWTH < 12 HOURS Performed at [facility]    Report Status PENDING  Incomplete     Studies: Ct Head Wo Contrast  11/14/2014   CLINICAL DATA:  Pain following fall.  Ethanol intoxication.  EXAM: CT HEAD WITHOUT CONTRAST  TECHNIQUE: Contiguous axial images were obtained from the base of the skull through the vertex without intravenous contrast.  COMPARISON:  November 08, 2014  FINDINGS: There  is age related volume loss. There is no intracranial mass, hemorrhage, extra-axial fluid collection, or midline shift. Gray-white compartments appear normal. No acute infarct evident. The bony calvarium appears intact. The mastoid air cells are clear. There is mucosal thickening in the left maxillary antrum as well as in the ethmoid air cells bilaterally.  IMPRESSION: Age related volume loss. No intracranial mass, hemorrhage, or focal gray -white compartment lesions/acute appearing infarct. No extra-axial fluid collection. There are areas of paranasal sinus disease.   Electronically Signed   By: Bretta Bang III M.D.   On: 11/14/2014 14:12   Dg Chest Portable 1 View  11/14/2014   CLINICAL DATA:  Weakness, CHF, smoker  EXAM: PORTABLE CHEST 1 VIEW  COMPARISON:  11/09/2014  FINDINGS: Mild bilateral interstitial prominence likely chronic. There is no focal parenchymal opacity. There is no pleural effusion or pneumothorax. The heart and mediastinal contours are unremarkable.  The osseous structures are unremarkable.  IMPRESSION: No active disease.   Electronically Signed   By: Elige Ko   On: 11/14/2014 10:00    Scheduled Meds: . antiseptic oral rinse  7 mL Mouth Rinse BID  . aspirin EC  81 mg Oral Daily  . Chlorhexidine Gluconate Cloth  6 each Topical Q0600  . diltiazem  30 mg Oral 4 times per day  . DULoxetine  30 mg Oral Daily  . enoxaparin (LOVENOX) injection  40 mg Subcutaneous Q24H  . folic acid  1 mg Oral Daily  . hydrocortisone sod succinate (SOLU-CORTEF) inj  50 mg Intravenous BID  . insulin aspart  0-9 Units Subcutaneous TID WC  . LORazepam  0-4 mg Intravenous Q4H   Followed by  . [START ON 11/17/2014] LORazepam  0-4 mg Intravenous Q12H  . multivitamin with minerals  1 tablet Oral Daily  . mupirocin ointment  1 application Nasal BID  . piperacillin-tazobactam (ZOSYN)  IV  3.375 g Intravenous 3 times per day  . thiamine  100 mg Oral Daily   Or  . thiamine  100 mg Intravenous Daily  . vancomycin  1,250 mg Intravenous Q12H   Continuous Infusions:   Active Problems:   HTN (hypertension)   Chronic diastolic heart failure (HCC)   Alcohol abuse   Sepsis (HCC)   Atrial fibrillation with rapid ventricular response (HCC)    Time spent: 25 min    [facility]  [facility] Pager [phone]. If 7PM-7AM, please contact night-coverage at [email or web address], password [facility] 11/15/2014, 12:58 PM  LOS: 1 day

## 2014-11-16 ENCOUNTER — Inpatient Hospital Stay (HOSPITAL_COMMUNITY): Payer: Managed Care, Other (non HMO)

## 2014-11-16 LAB — GLUCOSE, CAPILLARY
GLUCOSE-CAPILLARY: 175 mg/dL — AB (ref 65–99)
Glucose-Capillary: 91 mg/dL (ref 65–99)
Glucose-Capillary: 151 mg/dL — ABNORMAL HIGH (ref 65–99)
Glucose-Capillary: 104 mg/dL — ABNORMAL HIGH (ref 65–99)
Glucose-Capillary: 148 mg/dL — ABNORMAL HIGH (ref 65–99)

## 2014-11-16 LAB — COMPREHENSIVE METABOLIC PANEL
ALK PHOS: 99 U/L (ref 38–126)
ALT: 60 U/L (ref 17–63)
AST: 112 U/L — AB (ref 15–41)
Albumin: 3 g/dL — ABNORMAL LOW (ref 3.5–5.0)
Anion gap: 11 (ref 5–15)
BUN: 10 mg/dL (ref 6–20)
CALCIUM: 7.5 mg/dL — AB (ref 8.9–10.3)
CO2: 23 mmol/L (ref 22–32)
CREATININE: 0.81 mg/dL (ref 0.61–1.24)
Chloride: 101 mmol/L (ref 101–111)
Glucose, Bld: 144 mg/dL — ABNORMAL HIGH (ref 65–99)
Potassium: 3 mmol/L — ABNORMAL LOW (ref 3.5–5.1)
SODIUM: 135 mmol/L (ref 135–145)
Total Bilirubin: 2.3 mg/dL — ABNORMAL HIGH (ref 0.3–1.2)
Total Protein: 6.2 g/dL — ABNORMAL LOW (ref 6.5–8.1)

## 2014-11-16 LAB — CULTURE, BLOOD (ROUTINE X 2)

## 2014-11-16 LAB — CBC
HCT: 34.7 % — ABNORMAL LOW (ref 39.0–52.0)
HEMOGLOBIN: 11.7 g/dL — AB (ref 13.0–17.0)
MCH: 32 pg (ref 26.0–34.0)
MCHC: 33.7 g/dL (ref 30.0–36.0)
MCV: 94.8 fL (ref 78.0–100.0)
Platelets: 72 10*3/uL — ABNORMAL LOW (ref 150–400)
RBC: 3.66 MIL/uL — AB (ref 4.22–5.81)
RDW: 15.7 % — ABNORMAL HIGH (ref 11.5–15.5)
WBC: 12.4 10*3/uL — AB (ref 4.0–10.5)

## 2014-11-16 LAB — C DIFFICILE QUICK SCREEN W PCR REFLEX
C DIFFICILE (CDIFF) TOXIN: NEGATIVE
C DIFFICLE (CDIFF) ANTIGEN: POSITIVE — AB

## 2014-11-16 LAB — VANCOMYCIN, TROUGH: Vancomycin Tr: 19 ug/mL (ref 10.0–20.0)

## 2014-11-16 MED ORDER — VANCOMYCIN HCL IN DEXTROSE 1-5 GM/200ML-% IV SOLN: 1000.0000 mg | Freq: Two times a day (BID) | INTRAVENOUS | Status: DC

## 2014-11-16 MED ORDER — POTASSIUM CHLORIDE CRYS ER 20 MEQ PO TBCR
40.0000 meq | EXTENDED_RELEASE_TABLET | Freq: Four times a day (QID) | ORAL | Status: AC
  Administered 2014-11-16: 40 meq via ORAL
  Filled 2014-11-16 (×2): qty 2

## 2014-11-16 NOTE — Progress Notes (Signed)
Patient Name: Juan [name] Date of Encounter: 11/16/2014     Active Problems:   HTN (hypertension)   Chronic diastolic heart failure (HCC)   Alcohol abuse   Sepsis (HCC)   Atrial fibrillation with rapid ventricular response (HCC)    SUBJECTIVE  Seen while getting his 2D ECHO. Grunting. I asked if he was in pain and he grumbled no. Had  A long discussion with wife about atrial fibrillation. Discussed how alcohol can exacerbate this.   CURRENT MEDS . antiseptic oral rinse  7 mL Mouth Rinse BID  . aspirin EC  81 mg Oral Daily  . Chlorhexidine Gluconate Cloth  6 each Topical Q0600  . ciprofloxacin  1 drop Both Eyes Q2H while awake  . diltiazem  30 mg Oral 4 times per day  . DULoxetine  30 mg Oral Daily  . enoxaparin (LOVENOX) injection  40 mg Subcutaneous Q24H  . folic acid  1 mg Oral Daily  . hydrocortisone sod succinate (SOLU-CORTEF) inj  50 mg Intravenous BID  . insulin aspart  0-9 Units Subcutaneous TID WC  . LORazepam  0-4 mg Intravenous Q4H   Followed by  . [START ON 11/17/2014] LORazepam  0-4 mg Intravenous Q12H  . multivitamin with minerals  1 tablet Oral Daily  . mupirocin ointment  1 application Nasal BID  . piperacillin-tazobactam (ZOSYN)  IV  3.375 g Intravenous 3 times per day  . potassium chloride  40 mEq Oral Q6H  . thiamine  100 mg Oral Daily   Or  . thiamine  100 mg Intravenous Daily  . vancomycin  1,250 mg Intravenous Q12H    OBJECTIVE  Filed Vitals:   11/15/14 2200 11/16/14 0000 11/16/14 0200 11/16/14 0400  BP: 139/71 130/76 135/82 131/80  Pulse:      Temp:  98.7 F (37.1 C)  97.6 F (36.4 C)  TempSrc:  Axillary  Oral  Resp: 32 Height:      Weight:    182 lb 5.1 oz (82.7 kg)  SpO2: 98% 99% 97% 97%    Intake/Output Summary (Last 24 hours) at 11/16/14 0740 Last data filed at 11/16/14 5366  Gross per 24 hour  Intake   1405 ml  Output   1085 ml  Net    320 ml   Filed Weights   11/14/14 1500 11/16/14 0400  Weight: 179 lb  14.3 oz (81.6 kg) 182 lb 5.1 oz (82.7 kg)    PHYSICAL EXAM  General: Very disheveled, chronically ill appearing, NAD. Grunting Neuro: Alert and oriented X 3. Moves all extremities spontaneously. Psych: Normal affect. HEENT:  Normal  Neck: Supple without bruits or JVD. Lungs:  Resp regular and unlabored, CTA. Heart: irreg irreg. no s3, s4, or murmurs. Abdomen: Soft, non-tender, non-distended, BS + x 4.  Large hernia noted.  Extremities: No clubbing, cyanosis or edema. DP/PT/Radials 2+ and equal bilaterally.  Accessory Clinical Findings  CBC  Recent Labs  11/14/14 0953 11/15/14 0413 11/16/14 0345  WBC 20.0* 18.7* 12.4*  NEUTROABS 18.2*  --   --   HGB 15.4 12.0* 11.7*  HCT 43.6 34.9* 34.7*  MCV 92.0 92.6 94.8  PLT 112* 84* 72*   Basic Metabolic Panel  Recent Labs  11/14/14 0953  11/15/14 0413 11/16/14 0345  NA 130*  < > 135 135  K 3.8  < > 3.5 3.0*  CL 91*  < > 101 101  CO2 20*  < > 24 23  GLUCOSE 273*  < >  137* 144*  BUN 9  < > 12 10  CREATININE 0.92  < > 0.92 0.81  CALCIUM 8.0*  < > 7.2* 7.5*  MG 1.3*  --   --   --   < > = values in this interval not displayed. Liver Function Tests  Recent Labs  11/15/14 0413 11/16/14 0345  AST 122* 112*  ALT 65* 60  ALKPHOS 107 99  BILITOT 2.1* 2.3*  PROT 6.1* 6.2*  ALBUMIN 3.0* 3.0*   No results for input(s): LIPASE, AMYLASE in the last 72 hours. Cardiac Enzymes  Recent Labs  11/14/14 0953  TROPONINI <0.03   Hemoglobin A1C  Recent Labs  11/14/14 0953  HGBA1C 6.6*    TELE  afib with CVR in 80-100s.   Radiology/Studies  Dg Chest 1 View  11/09/2014  CLINICAL DATA:  Acute onset of shortness of breath. Initial encounter. EXAM: CHEST 1 VIEW COMPARISON:  Chest radiograph performed 07/18/2014 FINDINGS: The lungs are well-aerated. Mild vascular congestion is noted. Mild left basilar atelectasis is seen. There is no evidence of pleural effusion or pneumothorax. The cardiomediastinal silhouette is within  normal limits. No acute osseous abnormalities are seen. IMPRESSION: Mild vascular congestion noted.  Mild left basilar atelectasis seen. Electronically Signed   By: Roanna Raider M.D.   On: 11/09/2014 02:52   Ct Head Wo Contrast  11/14/2014  CLINICAL DATA:  Pain following fall.  Ethanol intoxication. EXAM: CT HEAD WITHOUT CONTRAST TECHNIQUE: Contiguous axial images were obtained from the base of the skull through the vertex without intravenous contrast. COMPARISON:  November 08, 2014 FINDINGS: There is age related volume loss. There is no intracranial mass, hemorrhage, extra-axial fluid collection, or midline shift. Gray-white compartments appear normal. No acute infarct evident. The bony calvarium appears intact. The mastoid air cells are clear. There is mucosal thickening in the left maxillary antrum as well as in the ethmoid air cells bilaterally. IMPRESSION: Age related volume loss. No intracranial mass, hemorrhage, or focal gray -white compartment lesions/acute appearing infarct. No extra-axial fluid collection. There are areas of paranasal sinus disease. Electronically Signed   By: Bretta Bang III M.D.   On: 11/14/2014 14:12   Ct Head Wo Contrast  11/09/2014  CLINICAL DATA:  Tripped and fell at home, with bruising at the nose and upper lip swelling. Initial encounter. EXAM: CT HEAD WITHOUT CONTRAST CT MAXILLOFACIAL WITHOUT CONTRAST TECHNIQUE: Multidetector CT imaging of the head and maxillofacial structures were performed using the standard protocol without intravenous contrast. Multiplanar CT image reconstructions of the maxillofacial structures were also generated. COMPARISON:  CT of the head performed 04/15/2014 FINDINGS: CT HEAD FINDINGS There is no evidence of acute infarction, mass lesion, or intra- or extra-axial hemorrhage on CT. The posterior fossa, including the cerebellum, brainstem and fourth ventricle, is within normal limits. The third and lateral ventricles, and basal ganglia are  unremarkable in appearance. The cerebral hemispheres are symmetric in appearance, with normal gray-white differentiation. No mass effect or midline shift is seen. There is no evidence of fracture; visualized osseous structures are unremarkable in appearance. The visualized portions of the orbits are within normal limits. Mucoperiosteal thickening is noted at the left maxillary sinus, and mild mucosal thickening is seen at the right maxillary sinus. The remaining paranasal sinuses and mastoid air cells are well-aerated. No significant soft tissue abnormalities are seen. CT MAXILLOFACIAL FINDINGS There is no evidence of fracture or dislocation. The maxilla and mandible appear intact. The nasal bone is unremarkable in appearance. Numerous maxillary and mandibular  dental caries are noted. The orbits are intact bilaterally. Mucoperiosteal thickening is noted at the left maxillary sinus, and mild mucosal thickening is seen at the right maxillary sinus. The remaining visualized paranasal sinuses and mastoid air cells are well-aerated. Calcification is noted at the carotid bifurcations bilaterally. The parapharyngeal fat planes are preserved. The nasopharynx, oropharynx and hypopharynx are unremarkable in appearance. The visualized portions of the valleculae and piriform sinuses are grossly unremarkable. The parotid and submandibular glands are within normal limits. No cervical lymphadenopathy is seen. IMPRESSION: 1. No evidence of traumatic intracranial injury or fracture. 2. No evidence of fracture or dislocation with regard to the maxillofacial structures. 3. Numerous maxillary and mandibular dental caries noted. 4. Mucoperiosteal thickening at the left maxillary sinus, and mild mucosal thickening at the right maxillary sinus. 5. Calcification at the carotid bifurcations bilaterally. Carotid ultrasound would be helpful for further evaluation, when and as deemed clinically appropriate. Electronically Signed   By: Roanna Raider M.D.   On: 11/09/2014 00:54   Dg Chest Portable 1 View  11/14/2014  CLINICAL DATA:  Weakness, CHF, smoker EXAM: PORTABLE CHEST 1 VIEW COMPARISON:  11/09/2014 FINDINGS: Mild bilateral interstitial prominence likely chronic. There is no focal parenchymal opacity. There is no pleural effusion or pneumothorax. The heart and mediastinal contours are unremarkable. The osseous structures are unremarkable. IMPRESSION: No active disease. Electronically Signed   By: Elige Ko   On: 11/14/2014 10:00   Ct Maxillofacial Wo Cm  11/09/2014  CLINICAL DATA:  Tripped and fell at home, with bruising at the nose and upper lip swelling. Initial encounter. EXAM: CT HEAD WITHOUT CONTRAST CT MAXILLOFACIAL WITHOUT CONTRAST TECHNIQUE: Multidetector CT imaging of the head and maxillofacial structures were performed using the standard protocol without intravenous contrast. Multiplanar CT image reconstructions of the maxillofacial structures were also generated. COMPARISON:  CT of the head performed 04/15/2014 FINDINGS: CT HEAD FINDINGS There is no evidence of acute infarction, mass lesion, or intra- or extra-axial hemorrhage on CT. The posterior fossa, including the cerebellum, brainstem and fourth ventricle, is within normal limits. The third and lateral ventricles, and basal ganglia are unremarkable in appearance. The cerebral hemispheres are symmetric in appearance, with normal gray-white differentiation. No mass effect or midline shift is seen. There is no evidence of fracture; visualized osseous structures are unremarkable in appearance. The visualized portions of the orbits are within normal limits. Mucoperiosteal thickening is noted at the left maxillary sinus, and mild mucosal thickening is seen at the right maxillary sinus. The remaining paranasal sinuses and mastoid air cells are well-aerated. No significant soft tissue abnormalities are seen. CT MAXILLOFACIAL FINDINGS There is no evidence of fracture or dislocation.  The maxilla and mandible appear intact. The nasal bone is unremarkable in appearance. Numerous maxillary and mandibular dental caries are noted. The orbits are intact bilaterally. Mucoperiosteal thickening is noted at the left maxillary sinus, and mild mucosal thickening is seen at the right maxillary sinus. The remaining visualized paranasal sinuses and mastoid air cells are well-aerated. Calcification is noted at the carotid bifurcations bilaterally. The parapharyngeal fat planes are preserved. The nasopharynx, oropharynx and hypopharynx are unremarkable in appearance. The visualized portions of the valleculae and piriform sinuses are grossly unremarkable. The parotid and submandibular glands are within normal limits. No cervical lymphadenopathy is seen. IMPRESSION: 1. No evidence of traumatic intracranial injury or fracture. 2. No evidence of fracture or dislocation with regard to the maxillofacial structures. 3. Numerous maxillary and mandibular dental caries noted. 4. Mucoperiosteal thickening at the  left maxillary sinus, and mild mucosal thickening at the right maxillary sinus. 5. Calcification at the carotid bifurcations bilaterally. Carotid ultrasound would be helpful for further evaluation, when and as deemed clinically appropriate. Electronically Signed   By: Roanna Raider M.D.   On: 11/09/2014 00:54    ASSESSMENT AND PLAN  Juan [name] is a 64 y.o. year old male with a history of NICM felt 2nd ETOH (30-35% in 2014, then normalized), NSVT 2014 in the setting of ETOH w/d and seizures, bifasicular block at baseline, COPD, tobacco abuse and ongoing alcohol abuse w/ freq falls who was admitted to [facility] on 11/14/14 for new onset afib with RVR and possible sepsis after being found passed out on porch.  Sepsis- He has an elevated lactic acid level and WBC.Blood cultures positive for Gram positive cocci.   Afib with RVR- transitioned to oral dilt  q6 hrs yesterday. HR stable 80-100s. Eventually  will likely convert into long acting Dilt.  -- 2D ECHO pending -- Not an anticoagulation candidate with his frequent falls.No plan for cardioversion without being able to use anticoagulation. He need treatment of his alcoholism.  Hypokalemia- K 3.0. Supplemented by IM  Billy Fischer PA-C  Pager [phone]  History and all data above reviewed.  Patient examined.  I agree with the findings as above.   More awake.  No acute complaints.   The patient exam reveals AVW:[id number]  ,  Lungs: Decreased breath sounds  ,  Abd: Positive bowel sounds, no rebound no guarding, Ext No edema  .  All available labs, radiology testing, previous records reviewed. Agree with documented assessment and plan. Atrial fib:  Rate is OK on PO Cardizem.  No anticoagulation.  Echo EF 50 - 55%.  We will follow as needed.     Alicia Donita Newland  12:48 PM  11/16/2014

## 2014-11-16 NOTE — Progress Notes (Signed)
[facility] PROGRESS NOTE  ARJUN SANTAMARINA YYT:[id number] DOB: Aug 12, 1950 DOA: 11/14/2014 PCP: Renold Don, MD  Assessment/Plan:  Sepsis -Present on admission, evidenced by a white count of 20,000,  heart rate of 137, respiratory rate of 31, lactate of 3.4  -Started on vancomycin and Zosyn on admission.  -1-2 blood cultures growing gram-positive cocci in clusters with microbiology reporting quadrant was negative staphylococcus species. -Chest x-ray did not reveal acute infiltrate, urinalysis negative. -Will discontinue broad-spectrum IV antimicrobial therapy and monitor. He has remained afebrile for the past 48 hours.  History of alcohol abuse with withdrawals -Initially patient presenting with acute alcohol intoxication, developing withdrawals over the course of this hospitalization. -Continue CIWA protocol with IV ativan   Alcohol ketoacidosis -Patient came with alcoholic ketoacidosis with anion gap of 19 likely secondary to starvation ketosis in setting of alcohol abuse  Recurrent falls -Patient has a recurrent fall , likely related to alcohol abuse and resultant severe malnutrition -CT head negative for any intracranial bleed  Atrial fibrillation -Patient initially presented in atrial fibrillation with rapid ventricular response. -He is now rate controlled, has been seen by cardiology and not felt to be good candidate for anticoagulation due to alcohol abuse and recurrent falls. -He is now on diltiazem 30 mg every 6 hours  Nutrition  Will advance the diet as tolerated.  Code Status: Full code Family Communication: Discussed with wife at bedside Disposition Plan: Given presence of alcohol withdrawal we'll continue close monitoring in the step down unit   Consultants:  Cardiology  Procedures:  None  Antibiotics:  Vancomycin stopped on 11/16/2014  Zosyn stopped on 11/16/2014  HPI/Subjective: 64 year old male who  has a past medical history of CHF  (congestive heart failure) (HCC) (10/2010); Alcoholism (HCC); Allergy; Anxiety; Depression; Asthma; Hyperlipidemia; Hypertension; Seizures (HCC); and COPD (chronic obstructive pulmonary disease) (HCC). Today was brought to the hospital after patient was found lying on the porch. Patient has a history of chronic alcohol abuse and had multiple falls recently. Patient complains of shortness of breath. He has a history of bronchiectasis. Denies chest pain. As per wife patient fell few days ago and fell on the face, hitting his head. Patient was prescribed prednisone taper on 11/09/2014 for shortness of breath. In the ED patient found to be hypotensive, tachycardic, with leukocytosis WBC 20,000  Patient is mildly sedated however arousable. He received IV Ativan overnight for withdrawal symptoms.  Objective: Filed Vitals:   11/16/14 1300  BP:   Pulse:   Temp: 98.3 F (36.8 C)  Resp:     Intake/Output Summary (Last 24 hours) at 11/16/14 1406 Last data filed at 11/16/14 0800  Gross per 24 hour  Intake    440 ml  Output    785 ml  Net   -345 ml   Filed Weights   11/14/14 1500 11/16/14 0400  Weight: 81.6 kg (179 lb 14.3 oz) 82.7 kg (182 lb 5.1 oz)    Exam:   General:  appears in no acute distress   Cardiovascular: S1-S2 irregular   Respiratory: Clear to auscultation bilaterally   Abdomen Soft, nontender, no organomegaly   Musculoskeletal:  no cyanosis/clubbing/edema of the lower extremities   Data Reviewed: Basic Metabolic Panel:  Recent Labs Lab 11/14/14 0953 11/14/14 1650 11/15/14 0413 11/16/14 0345  NA 130* 130* 135 135  K 3.8 2.9* 3.5 3.0*  CL 91* 95* 101 101  CO2 20* 19* 24 23  GLUCOSE 273* 242* 137* 144*  BUN 9 9 12 10   CREATININE 0.92 0.76  0.92 0.81  CALCIUM 8.0* 7.2* 7.2* 7.5*  MG 1.3*  --   --   --    Liver Function Tests:  Recent Labs Lab 11/14/14 0953 11/15/14 0413 11/16/14 0345  AST 156* 122* 112*  ALT 86* 65* 60  ALKPHOS 151* 107 99  BILITOT  1.4* 2.1* 2.3*  PROT 7.1 6.1* 6.2*  ALBUMIN 3.7 3.0* 3.0*   No results for input(s): LIPASE, AMYLASE in the last 168 hours. No results for input(s): AMMONIA in the last 168 hours. CBC:  Recent Labs Lab 11/14/14 0953 11/15/14 0413 11/16/14 0345  WBC 20.0* 18.7* 12.4*  NEUTROABS 18.2*  --   --   HGB 15.4 12.0* 11.7*  HCT 43.6 34.9* 34.7*  MCV 92.0 92.6 94.8  PLT 112* 84* 72*   Cardiac Enzymes:  Recent Labs Lab 11/14/14 0953  TROPONINI <0.03   BNP (last 3 results)  Recent Labs  06/26/14 1200 07/18/14 0018 11/14/14 0953  BNP 574.4* 554.8* 49.7    ProBNP (last 3 results) No results for input(s): PROBNP in the last 8760 hours.  CBG:  Recent Labs Lab 11/15/14 0828 11/15/14 1157 11/15/14 1547 11/15/14 2211 11/16/14 0822  GLUCAP 123* 196* 125* 91 151*    Recent Results (from the past 240 hour(s))  Culture, blood (routine x 2)     Status: None   Collection Time: 11/14/14  1:15 PM  Result Value Ref Range Status   Specimen Description BLOOD RIGHT HAND  Final   Special Requests BOTTLES DRAWN AEROBIC AND ANAEROBIC 5CC   Final   Culture  Setup Time   Final    GRAM POSITIVE COCCI IN CLUSTERS IN BOTH AEROBIC AND ANAEROBIC BOTTLES CRITICAL RESULT CALLED TO, READ BACK BY AND VERIFIED WITH: S [facility] 11/15/14 @ 1102 M VESTAL    Culture   Final    STAPHYLOCOCCUS SPECIES (COAGULASE NEGATIVE) THE SIGNIFICANCE OF ISOLATING THIS ORGANISM FROM A SINGLE SET OF BLOOD CULTURES WHEN MULTIPLE SETS ARE DRAWN IS UNCERTAIN. PLEASE NOTIFY THE MICROBIOLOGY DEPARTMENT WITHIN ONE WEEK IF SPECIATION AND SENSITIVITIES ARE REQUIRED. Performed at [facility]    Report Status 11/16/2014 FINAL  Final  Culture, blood (routine x 2)     Status: None (Preliminary result)   Collection Time: 11/14/14  1:26 PM  Result Value Ref Range Status   Specimen Description BLOOD LEFT HAND  Final   Special Requests BOTTLES DRAWN AEROBIC AND ANAEROBIC 5CC   Final   Culture   Final    NO GROWTH 2  DAYS Performed at [facility]    Report Status PENDING  Incomplete  MRSA PCR Screening     Status: Abnormal   Collection Time: 11/14/14  2:55 PM  Result Value Ref Range Status   MRSA by PCR POSITIVE (A) NEGATIVE Final    Comment:        The GeneXpert MRSA Assay (FDA approved for NASAL specimens only), is one component of a comprehensive MRSA colonization surveillance program. It is not intended to diagnose MRSA infection nor to guide or monitor treatment for MRSA infections. RESULT CALLED TO, READ BACK BY AND VERIFIED WITH: Marty Heck [id number] @ 1608 BY J SCOTTON   Urine culture     Status: None   Collection Time: 11/14/14  5:20 PM  Result Value Ref Range Status   Specimen Description URINE, RANDOM  Final   Special Requests NONE  Final   Culture   Final    NO GROWTH 1 DAY Performed at [facility]  Report Status 11/15/2014 FINAL  Final  C difficile quick screen w PCR reflex     Status: Abnormal   Collection Time: 11/16/14  2:00 AM  Result Value Ref Range Status   C Diff antigen POSITIVE (A) NEGATIVE Final   C Diff toxin NEGATIVE NEGATIVE Final   C Diff interpretation   Final    C. difficile present, but toxin not detected. This indicates colonization. In most cases, this does not require treatment. If patient has signs and symptoms consistent with colitis, consider treatment. Requires ENTERIC precautions.     Studies: Ct Head Wo Contrast  11/14/2014  CLINICAL DATA:  Pain following fall.  Ethanol intoxication. EXAM: CT HEAD WITHOUT CONTRAST TECHNIQUE: Contiguous axial images were obtained from the base of the skull through the vertex without intravenous contrast. COMPARISON:  November 08, 2014 FINDINGS: There is age related volume loss. There is no intracranial mass, hemorrhage, extra-axial fluid collection, or midline shift. Gray-white compartments appear normal. No acute infarct evident. The bony calvarium appears intact. The mastoid air cells are clear.  There is mucosal thickening in the left maxillary antrum as well as in the ethmoid air cells bilaterally. IMPRESSION: Age related volume loss. No intracranial mass, hemorrhage, or focal gray -white compartment lesions/acute appearing infarct. No extra-axial fluid collection. There are areas of paranasal sinus disease. Electronically Signed   By: Bretta Bang III M.D.   On: 11/14/2014 14:12    Scheduled Meds: . antiseptic oral rinse  7 mL Mouth Rinse BID  . aspirin EC  81 mg Oral Daily  . Chlorhexidine Gluconate Cloth  6 each Topical Q0600  . ciprofloxacin  1 drop Both Eyes Q2H while awake  . diltiazem  30 mg Oral 4 times per day  . DULoxetine  30 mg Oral Daily  . enoxaparin (LOVENOX) injection  40 mg Subcutaneous Q24H  . folic acid  1 mg Oral Daily  . hydrocortisone sod succinate (SOLU-CORTEF) inj  50 mg Intravenous BID  . insulin aspart  0-9 Units Subcutaneous TID WC  . LORazepam  0-4 mg Intravenous Q4H   Followed by  . [START ON 11/17/2014] LORazepam  0-4 mg Intravenous Q12H  . multivitamin with minerals  1 tablet Oral Daily  . mupirocin ointment  1 application Nasal BID  . piperacillin-tazobactam (ZOSYN)  IV  3.375 g Intravenous 3 times per day  . potassium chloride  40 mEq Oral Q6H  . thiamine  100 mg Oral Daily   Or  . thiamine  100 mg Intravenous Daily  . [START ON 11/17/2014] vancomycin  1,000 mg Intravenous Q12H   Continuous Infusions:   Active Problems:   HTN (hypertension)   Chronic diastolic heart failure (HCC)   Alcohol abuse   Sepsis (HCC)   Atrial fibrillation with rapid ventricular response (HCC)    Time spent: 25 min    Jeralyn Bennett  [facility] Pager [phone]. If 7PM-7AM, please contact night-coverage at [email or web address], password [facility] 11/16/2014, 2:06 PM  LOS: 2 days

## 2014-11-16 NOTE — Progress Notes (Signed)
RN received telephone order from Donnamarie Poag during computer downtime for a c-diff test; RN collected sample and took to lab.

## 2014-11-16 NOTE — Progress Notes (Signed)
ANTIBIOTIC CONSULT NOTE - FOLLOW UP  Pharmacy Consult for Vancomycin, Zosyn Indication: rule out sepsis  No Known Allergies  Patient Measurements: Height: 5\' 8"  (172.7 cm) Weight: 182 lb 5.1 oz (82.7 kg) IBW/kg (Calculated) : 68.4  Vital Signs: Temp: 98.4 F (36.9 C) (10/12 0745) Temp Source: Oral (10/12 0745) BP: 122/92 mmHg (10/12 0900) Pulse Rate: 82 (10/12 0900) Intake/Output from previous day: 10/11 0701 - 10/12 0700 In: 1405 [P.O.:490; I.V.:95; IV Piggyback:650] Out: 1085 [Urine:1085]  Labs:  Recent Labs  11/14/14 0953 11/14/14 1650 11/15/14 0413 11/16/14 0345  WBC 20.0*  --  18.7* 12.4*  HGB 15.4  --  12.0* 11.7*  PLT 112*  --  84* 72*  CREATININE 0.92 0.76 0.92 0.81   Estimated Creatinine Clearance: 99.1 mL/min (by C-G formula based on Cr of 0.81).  Recent Labs  11/16/14 1142  VANCOTROUGH 19     Assessment: 64 yoM admitted on 10/10 after being found down, with history of multiple fall recently.  PMH significant for chronic alcoholism, COPD/asthma, anxiety, depression, seizures, HTN, HLD, and CHF.  Pharmacy is consulted to dose Vancomycin and Zosyn for suspected sepsis with elevated WBC and tachycardia on presentation.    10/10 >>vancomycin>> 10/10 >> zosyn >>   Today, 11/16/2014:  Afebrile  WBC elevated but improving, 12.4  SCr 0.81 with Crcl ~ 99 ml/min  Blood cultures:  Only 1/2 culture with GPC  Urine culture: NGTD  Vancomycin trough level 19 (drawn late, ~ 1.75 hour after dose was due).  Likely therapeutic at upper end of range or supra-therapeutic, if drawn pre-dose.   Goal of Therapy:  Vancomycin trough level 15-20 mcg/ml Appropriate abx dosing, eradication of infection.  Plan:   Continue Zosyn 3.375g IV Q8H infused over 4hrs.   Decrease to Vancomycin 1g IV q12h.  Measure Vanc trough at steady state.  Follow up renal fxn, culture results, and clinical course.  Lynann Beaver PharmD, BCPS Pager [phone] 11/16/2014 10:59  AM

## 2014-11-17 LAB — GLUCOSE, CAPILLARY
GLUCOSE-CAPILLARY: 234 mg/dL — AB (ref 65–99)
Glucose-Capillary: 115 mg/dL — ABNORMAL HIGH (ref 65–99)
Glucose-Capillary: 179 mg/dL — ABNORMAL HIGH (ref 65–99)
Glucose-Capillary: 111 mg/dL — ABNORMAL HIGH (ref 65–99)

## 2014-11-17 LAB — CBC
HCT: 34.3 % — ABNORMAL LOW (ref 39.0–52.0)
HEMOGLOBIN: 11.7 g/dL — AB (ref 13.0–17.0)
MCH: 32.6 pg (ref 26.0–34.0)
MCHC: 34.1 g/dL (ref 30.0–36.0)
MCV: 95.5 fL (ref 78.0–100.0)
Platelets: 87 10*3/uL — ABNORMAL LOW (ref 150–400)
RBC: 3.59 MIL/uL — AB (ref 4.22–5.81)
RDW: 15.7 % — ABNORMAL HIGH (ref 11.5–15.5)
WBC: 8.9 10*3/uL (ref 4.0–10.5)

## 2014-11-17 LAB — BASIC METABOLIC PANEL
ANION GAP: 6 (ref 5–15)
BUN: 10 mg/dL (ref 6–20)
CHLORIDE: 104 mmol/L (ref 101–111)
CO2: 27 mmol/L (ref 22–32)
Calcium: 7.6 mg/dL — ABNORMAL LOW (ref 8.9–10.3)
Creatinine, Ser: 0.85 mg/dL (ref 0.61–1.24)
GFR calc Af Amer: >60 mL/min (ref >60)
GFR calc non Af Amer: >60 mL/min (ref >60)
Glucose, Bld: 103 mg/dL — ABNORMAL HIGH (ref 65–99)
Potassium: 3.1 mmol/L — ABNORMAL LOW (ref 3.5–5.1)
SODIUM: 137 mmol/L (ref 135–145)

## 2014-11-17 MED ORDER — POTASSIUM CHLORIDE CRYS ER 20 MEQ PO TBCR
40.0000 meq | EXTENDED_RELEASE_TABLET | Freq: Four times a day (QID) | ORAL | Status: AC
  Administered 2014-11-17 (×2): 40 meq via ORAL
  Filled 2014-11-17 (×2): qty 2

## 2014-11-17 MED ORDER — INFLUENZA VAC SPLIT QUAD 0.5 ML IM SUSY
0.5000 mL | PREFILLED_SYRINGE | INTRAMUSCULAR | Status: AC
  Administered 2014-11-18: 0.5 mL via INTRAMUSCULAR
  Filled 2014-11-17 (×2): qty 0.5

## 2014-11-17 NOTE — Plan of Care (Signed)
Problem: Phase I Progression Outcomes Goal: OOB as tolerated unless otherwise ordered Outcome: Not Progressing Lifted back to bed this am, pt very weak.

## 2014-11-17 NOTE — Progress Notes (Signed)
[facility] PROGRESS NOTE  MASATO ZUNK XYB:[id number] DOB: 1950/04/23 DOA: 11/14/2014 PCP: Renold Don, MD  Assessment/Plan:  Sepsis -Present on admission, evidenced by a white count of 20,000,  heart rate of 137, respiratory rate of 31, lactate of 3.4  -Started on vancomycin and Zosyn on admission.  -1-2 blood cultures growing gram-positive cocci in clusters with microbiology reporting quadrant was negative staphylococcus species. -Chest x-ray did not reveal acute infiltrate, urinalysis negative. -On 11/16/2014 discontinued broad-spectrum IV antimicrobial therapy. He has remained afebrile for the past 72 hours, continues to show improvement. -On 11/17/2014 white count normalizing at 8900  History of alcohol abuse with withdrawals -Initially patient presenting with acute alcohol intoxication, developing withdrawals over the course of this hospitalization. -Continue CIWA protocol with IV ativan  -On 11/17/2014 patient showed significant clinical improvement was assisted out of bed to chair. Transfer orders written for telemetry.  Alcohol ketoacidosis -Patient came with alcoholic ketoacidosis with anion gap of 19 likely secondary to starvation ketosis in setting of alcohol abuse -Lab stable  Recurrent falls -Patient has a recurrent fall , likely related to alcohol abuse and resultant severe malnutrition -CT head negative for any intracranial bleed -Physical therapy consultation placed, I suspect he will need rehabilitation at skilled nursing facility  Atrial fibrillation -Patient initially presented in atrial fibrillation with rapid ventricular response. -He is now rate controlled, has been seen by cardiology and not felt to be good candidate for anticoagulation due to alcohol abuse and recurrent falls. -He is now on diltiazem 30 mg every 6 hours  Nutrition  Will advance the diet as tolerated.  Hypokalemia -A.m. lab work showing potassium of 3.1, will administer  potassium replacement with Kdur 40 mEq by mouth every 6 hours 2 doses today.  Code Status: Full code Family Communication: Discussed with wife at bedside Disposition Plan: Given presence of alcohol withdrawal we'll continue close monitoring in the step down unit   Consultants:  Cardiology  Procedures:  None  Antibiotics:  Vancomycin stopped on 11/16/2014  Zosyn stopped on 11/16/2014  HPI/Subjective: 64 year old male who  has a past medical history of CHF (congestive heart failure) (HCC) (10/2010); Alcoholism (HCC); Allergy; Anxiety; Depression; Asthma; Hyperlipidemia; Hypertension; Seizures (HCC); and COPD (chronic obstructive pulmonary disease) (HCC). Today was brought to the hospital after patient was found lying on the porch. Patient has a history of chronic alcohol abuse and had multiple falls recently. Patient complains of shortness of breath. He has a history of bronchiectasis. Denies chest pain. As per wife patient fell few days ago and fell on the face, hitting his head. Patient was prescribed prednisone taper on 11/09/2014 for shortness of breath. In the ED patient found to be hypotensive, tachycardic, with leukocytosis WBC 20,000  Patient expander today, he is more awake and alert, he was assisted out of bed to chair.  Objective: Filed Vitals:   11/17/14 0800  BP: 135/87  Pulse: 101  Temp: 98.3 F (36.8 C)  Resp: 23    Intake/Output Summary (Last 24 hours) at 11/17/14 0905 Last data filed at 11/16/14 1700  Gross per 24 hour  Intake      0 ml  Output    400 ml  Net   -400 ml   Filed Weights   11/14/14 1500 11/16/14 0400  Weight: 81.6 kg (179 lb 14.3 oz) 82.7 kg (182 lb 5.1 oz)    Exam:   General:  appears in no acute distress, he is more awake and alert today, assisted out of bed to chair.  Cardiovascular: S1-S2 irregular   Respiratory: Clear to auscultation bilaterally   Abdomen Soft, nontender, no organomegaly   Musculoskeletal:  no  cyanosis/clubbing/edema of the lower extremities, has generalized weakness, 2 person assist to get out of bed.  Data Reviewed: Basic Metabolic Panel:  Recent Labs Lab 11/14/14 0953 11/14/14 1650 11/15/14 0413 11/16/14 0345 11/17/14 0340  NA 130* 130* 135 135 137  K 3.8 2.9* 3.5 3.0* 3.1*  CL 91* 95* 101 101 104  CO2 20* 19* GLUCOSE 273* 242* 137* 144* 103*  BUN CREATININE 0.92 0.76 0.92 0.81 0.85  CALCIUM 8.0* 7.2* 7.2* 7.5* 7.6*  MG 1.3*  --   --   --   --    Liver Function Tests:  Recent Labs Lab 11/14/14 0953 11/15/14 0413 11/16/14 0345  AST 156* 122* 112*  ALT 86* 65* 60  ALKPHOS 151* 107 99  BILITOT 1.4* 2.1* 2.3*  PROT 7.1 6.1* 6.2*  ALBUMIN 3.7 3.0* 3.0*   No results for input(s): LIPASE, AMYLASE in the last 168 hours. No results for input(s): AMMONIA in the last 168 hours. CBC:  Recent Labs Lab 11/14/14 0953 11/15/14 0413 11/16/14 0345 11/17/14 0340  WBC 20.0* 18.7* 12.4* 8.9  NEUTROABS 18.2*  --   --   --   HGB 15.4 12.0* 11.7* 11.7*  HCT 43.6 34.9* 34.7* 34.3*  MCV 92.0 92.6 94.8 95.5  PLT 112* 84* 72* 87*   Cardiac Enzymes:  Recent Labs Lab 11/14/14 0953  TROPONINI <0.03   BNP (last 3 results)  Recent Labs  06/26/14 1200 07/18/14 0018 11/14/14 0953  BNP 574.4* 554.8* 49.7    ProBNP (last 3 results) No results for input(s): PROBNP in the last 8760 hours.  CBG:  Recent Labs Lab 11/16/14 0822 11/16/14 1224 11/16/14 1612 11/16/14 2223 11/17/14 0731  GLUCAP 151* 104* 148* 175* 115*    Recent Results (from the past 240 hour(s))  Culture, blood (routine x 2)     Status: None   Collection Time: 11/14/14  1:15 PM  Result Value Ref Range Status   Specimen Description BLOOD RIGHT HAND  Final   Special Requests BOTTLES DRAWN AEROBIC AND ANAEROBIC 5CC   Final   Culture  Setup Time   Final    GRAM POSITIVE COCCI IN CLUSTERS IN BOTH AEROBIC AND ANAEROBIC BOTTLES CRITICAL RESULT CALLED TO, READ BACK BY  AND VERIFIED WITH: S [facility] 11/15/14 @ 1102 M VESTAL    Culture   Final    STAPHYLOCOCCUS SPECIES (COAGULASE NEGATIVE) THE SIGNIFICANCE OF ISOLATING THIS ORGANISM FROM A SINGLE SET OF BLOOD CULTURES WHEN MULTIPLE SETS ARE DRAWN IS UNCERTAIN. PLEASE NOTIFY THE MICROBIOLOGY DEPARTMENT WITHIN ONE WEEK IF SPECIATION AND SENSITIVITIES ARE REQUIRED. Performed at [facility]    Report Status 11/16/2014 FINAL  Final  Culture, blood (routine x 2)     Status: None (Preliminary result)   Collection Time: 11/14/14  1:26 PM  Result Value Ref Range Status   Specimen Description BLOOD LEFT HAND  Final   Special Requests BOTTLES DRAWN AEROBIC AND ANAEROBIC 5CC   Final   Culture   Final    NO GROWTH 2 DAYS Performed at [facility]    Report Status PENDING  Incomplete  MRSA PCR Screening     Status: Abnormal   Collection Time: 11/14/14  2:55 PM  Result Value Ref Range Status   MRSA by PCR POSITIVE (A) NEGATIVE Final  Comment:        The GeneXpert MRSA Assay (FDA approved for NASAL specimens only), is one component of a comprehensive MRSA colonization surveillance program. It is not intended to diagnose MRSA infection nor to guide or monitor treatment for MRSA infections. RESULT CALLED TO, READ BACK BY AND VERIFIED WITH: LISA FREI,RN [id number] @ 1608 BY J SCOTTON   Urine culture     Status: None   Collection Time: 11/14/14  5:20 PM  Result Value Ref Range Status   Specimen Description URINE, RANDOM  Final   Special Requests NONE  Final   Culture   Final    NO GROWTH 1 DAY Performed at [facility]    Report Status 11/15/2014 FINAL  Final  C difficile quick screen w PCR reflex     Status: Abnormal   Collection Time: 11/16/14  2:00 AM  Result Value Ref Range Status   C Diff antigen POSITIVE (A) NEGATIVE Final   C Diff toxin NEGATIVE NEGATIVE Final   C Diff interpretation   Final    C. difficile present, but toxin not detected. This indicates colonization. In most  cases, this does not require treatment. If patient has signs and symptoms consistent with colitis, consider treatment. Requires ENTERIC precautions.     Studies: No results found.  Scheduled Meds: . antiseptic oral rinse  7 mL Mouth Rinse BID  . aspirin EC  81 mg Oral Daily  . Chlorhexidine Gluconate Cloth  6 each Topical Q0600  . ciprofloxacin  1 drop Both Eyes Q2H while awake  . diltiazem  30 mg Oral 4 times per day  . DULoxetine  30 mg Oral Daily  . enoxaparin (LOVENOX) injection  40 mg Subcutaneous Q24H  . folic acid  1 mg Oral Daily  . insulin aspart  0-9 Units Subcutaneous TID WC  . LORazepam  0-4 mg Intravenous Q12H  . multivitamin with minerals  1 tablet Oral Daily  . mupirocin ointment  1 application Nasal BID  . potassium chloride  40 mEq Oral Q6H  . thiamine  100 mg Oral Daily   Or  . thiamine  100 mg Intravenous Daily   Continuous Infusions:   Active Problems:   HTN (hypertension)   Chronic diastolic heart failure (HCC)   Alcohol abuse   Sepsis (HCC)   Atrial fibrillation with rapid ventricular response (HCC)    Time spent: 25 min    Jeralyn Bennett  [facility] Pager [phone]. If 7PM-7AM, please contact night-coverage at [email or web address], password [facility] 11/17/2014, 9:05 AM  LOS: 3 days

## 2014-11-18 DIAGNOSIS — F10231 Alcohol dependence with withdrawal delirium: Secondary | ICD-10-CM

## 2014-11-18 LAB — CBC
HCT: 39 % (ref 39.0–52.0)
Hemoglobin: 12.9 g/dL — ABNORMAL LOW (ref 13.0–17.0)
MCH: 31.7 pg (ref 26.0–34.0)
MCHC: 33.1 g/dL (ref 30.0–36.0)
MCV: 95.8 fL (ref 78.0–100.0)
PLATELETS: 99 10*3/uL — AB (ref 150–400)
RBC: 4.07 MIL/uL — AB (ref 4.22–5.81)
RDW: 15.5 % (ref 11.5–15.5)
WBC: 8.3 10*3/uL (ref 4.0–10.5)

## 2014-11-18 LAB — GLUCOSE, CAPILLARY
GLUCOSE-CAPILLARY: 164 mg/dL — AB (ref 65–99)
Glucose-Capillary: 122 mg/dL — ABNORMAL HIGH (ref 65–99)
Glucose-Capillary: 164 mg/dL — ABNORMAL HIGH (ref 65–99)
Glucose-Capillary: 150 mg/dL — ABNORMAL HIGH (ref 65–99)

## 2014-11-18 LAB — BASIC METABOLIC PANEL
Anion gap: 8 (ref 5–15)
BUN: 7 mg/dL (ref 6–20)
CALCIUM: 8.4 mg/dL — AB (ref 8.9–10.3)
CHLORIDE: 101 mmol/L (ref 101–111)
CO2: 28 mmol/L (ref 22–32)
CREATININE: 0.77 mg/dL (ref 0.61–1.24)
GFR calc non Af Amer: >60 mL/min (ref >60)
Glucose, Bld: 128 mg/dL — ABNORMAL HIGH (ref 65–99)
Potassium: 3.3 mmol/L — ABNORMAL LOW (ref 3.5–5.1)
SODIUM: 137 mmol/L (ref 135–145)

## 2014-11-18 MED ORDER — DILTIAZEM HCL ER COATED BEADS 180 MG PO CP24
180.0000 mg | ORAL_CAPSULE | Freq: Every day | ORAL | Status: DC
  Administered 2014-11-18 – 2014-11-22 (×5): 180 mg via ORAL
  Filled 2014-11-18 (×6): qty 1

## 2014-11-18 MED ORDER — POTASSIUM CHLORIDE CRYS ER 20 MEQ PO TBCR
40.0000 meq | EXTENDED_RELEASE_TABLET | Freq: Once | ORAL | Status: AC
  Administered 2014-11-18: 40 meq via ORAL
  Filled 2014-11-18: qty 2

## 2014-11-18 MED ORDER — SALINE SPRAY 0.65 % NA SOLN
1.0000 | NASAL | Status: DC | PRN
  Filled 2014-11-18: qty 44

## 2014-11-18 MED ORDER — LEVALBUTEROL HCL 0.63 MG/3ML IN NEBU
0.6300 mg | INHALATION_SOLUTION | Freq: Four times a day (QID) | RESPIRATORY_TRACT | Status: DC
  Administered 2014-11-18 (×2): 0.63 mg via RESPIRATORY_TRACT
  Filled 2014-11-18 (×3): qty 3

## 2014-11-18 MED ORDER — DILTIAZEM HCL ER COATED BEADS 120 MG PO CP24
120.0000 mg | ORAL_CAPSULE | Freq: Every day | ORAL | Status: DC
  Filled 2014-11-18: qty 1

## 2014-11-18 MED ORDER — IPRATROPIUM BROMIDE 0.02 % IN SOLN
0.5000 mg | Freq: Four times a day (QID) | RESPIRATORY_TRACT | Status: DC
  Administered 2014-11-18 (×2): 0.5 mg via RESPIRATORY_TRACT
  Filled 2014-11-18 (×4): qty 2.5

## 2014-11-18 NOTE — Evaluation (Addendum)
Physical Therapy Evaluation Patient Details Name: Juan [name] MRN: [id number] DOB: March 21, 1950 Today's Date: 11/18/2014   History of Present Illness  64 year old male who  has a past medical history of CHF (congestive heart failure) (HCC) (10/2010); Alcoholism (HCC); Allergy; Anxiety; Depression; Asthma; Hyperlipidemia; Hypertension; Seizures (HCC); EtOH abuse and COPD . Pt was found down on front porch by his wife. She reports multiple falls at baseline but there's been an increase in falls recently.  Clinical Impression  Pt admitted with above diagnosis. Pt currently with functional limitations due to the deficits listed below (see PT Problem List). Pt noted to be in a fib at rest (HR 115-140) prior to PT session, RN stated PT could proceed with mobility. +2 total assist for pivot transfer, pt unable to achieve full standing. ST-SNF recommended for strengthening. Family reports several month h/o frequent falls. He is not safe to return home at present. Pt will benefit from skilled PT to increase their independence and safety with mobility to allow discharge to the venue listed below.       Follow Up Recommendations SNF;Supervision/Assistance - 24 hour    Equipment Recommendations  Other (comment) (to be determined)    Recommendations for Other Services       Precautions / Restrictions Precautions Precautions: Fall Precaution Comments: multiple falls for several months Restrictions Weight Bearing Restrictions: No      Mobility  Bed Mobility Overal bed mobility: Modified Independent             General bed mobility comments: mod I sit to supine using bedrail  Transfers Overall transfer level: Needs assistance   Transfers: Sit to/from Stand;Stand Pivot Transfers Sit to Stand: +2 physical assistance;Total assist Stand pivot transfers: +2 physical assistance;Total assist       General transfer comment: pt unable to achieve upright standing position with +2 total  assist x 3 trials, performed SPT recliner to bed with +2 total assist, pt's knees/hips flexed in standing  Ambulation/Gait                Stairs            Wheelchair Mobility    Modified Rankin (Stroke Patients Only)       Balance                                             Pertinent Vitals/Pain      Home Living Family/patient expects to be discharged to:: Skilled nursing facility (for etoh) Living Arrangements: Spouse/significant other                    Prior Function Level of Independence: Needs assistance         Comments: wife reports pt was sometimes crawling on floor and has had multiple falls for several months     Hand Dominance   Dominant Hand: Right    Extremity/Trunk Assessment   Upper Extremity Assessment: Defer to OT evaluation           Lower Extremity Assessment: Generalized weakness      Cervical / Trunk Assessment: Normal  Communication   Communication: No difficulties  Cognition Arousal/Alertness: Awake/alert Behavior During Therapy: WFL for tasks assessed/performed Overall Cognitive Status: Within Functional Limits for tasks assessed  General Comments      Exercises        Assessment/Plan    PT Assessment Patient needs continued PT services  PT Diagnosis Difficulty walking;Generalized weakness   PT Problem List Decreased strength;Decreased activity tolerance;Decreased balance;Decreased mobility;Decreased safety awareness;Obesity  PT Treatment Interventions Gait training;Functional mobility training;Therapeutic activities;Patient/family education;Therapeutic exercise   PT Goals (Current goals can be found in the Care Plan section) Acute Rehab PT Goals Patient Stated Goal: to get stronger PT Goal Formulation: With patient/family Time For Goal Achievement: 12/02/14 Potential to Achieve Goals: Fair    Frequency Min 3X/week   Barriers to discharge         Co-evaluation               End of Session Equipment Utilized During Treatment: Gait belt Activity Tolerance: Patient limited by fatigue Patient left: in bed;with call bell/phone within reach;with nursing/sitter in room Nurse Communication: Mobility status         Time: 9562-1308 PT Time Calculation (min) (ACUTE ONLY): 30 min   Charges:   PT Evaluation $Initial PT Evaluation Tier I: 1 Procedure PT Treatments $Therapeutic Activity: 8-22 mins   PT G Codes:        Tamala Ser 11/18/2014, 10:09 AM [phone]

## 2014-11-18 NOTE — Progress Notes (Signed)
Date: November 18, 2014 Chart reviewed for concurrent status and case management needs. Will continue to follow patient for changes and needs: Rhonda Davis, RN, BSN, CCM   [phone] 

## 2014-11-18 NOTE — Progress Notes (Signed)
PT demonstrated verbal and hands on understanding of Flutter device. 

## 2014-11-18 NOTE — Progress Notes (Signed)
[facility] PROGRESS NOTE  Juan [name] BEE:[id number] DOB: May 20, 1950 DOA: 11/14/2014 PCP: Renold Don, MD  Assessment/Plan:  Sepsis -Present on admission, evidenced by a white count of 20,000,  heart rate of 137, respiratory rate of 31, lactate of 3.4  -Started on vancomycin and Zosyn on admission.  -1-2 blood cultures growing gram-positive cocci in clusters with microbiology reporting quadrant was negative staphylococcus species. -Chest x-ray did not reveal acute infiltrate, urinalysis negative. -On 11/16/2014 discontinued broad-spectrum IV antimicrobial therapy. He has remained afebrile for the past 72 hours, continues to show improvement. -He remains hemodynamically stable  History of alcohol abuse with withdrawals -Initially patient presenting with acute alcohol intoxication, developing withdrawals over the course of this hospitalization. -Appears better  Alcohol ketoacidosis -Patient came with alcoholic ketoacidosis with anion gap of 19 likely secondary to starvation ketosis in setting of alcohol abuse -Lab stable  Recurrent falls -Patient has a recurrent fall , likely related to alcohol abuse and resultant severe malnutrition -CT head negative for any intracranial bleed -Physical therapy consultation placed, I suspect he will need rehabilitation at skilled nursing facility  Atrial fibrillation -Patient initially presented in atrial fibrillation with rapid ventricular response. -He is now rate controlled, has been seen by cardiology and not felt to be good candidate for anticoagulation due to alcohol abuse and recurrent falls. -On 11/18/2014 his cardizem was changed to Cardizem CD 180 mg PO q daily  -Remains in Afib  Nutrition  Will advance the diet as tolerated.  Hypokalemia -On AM 11/18/2014 labs K 3.3 -Will provide additional potassium replacement  Code Status: Full code Family Communication: Discussed with wife at bedside Disposition Plan: Advancing  diet to regular diet. Physical Therapy consulted.    Consultants:  Cardiology  Procedures:  None  Antibiotics:  Vancomycin stopped on 11/16/2014  Zosyn stopped on 11/16/2014  HPI/Subjective: 64 year old male who  has a past medical history of CHF (congestive heart failure) (HCC) (10/2010); Alcoholism (HCC); Allergy; Anxiety; Depression; Asthma; Hyperlipidemia; Hypertension; Seizures (HCC); and COPD (chronic obstructive pulmonary disease) (HCC). Today was brought to the hospital after patient was found lying on the porch. Patient has a history of chronic alcohol abuse and had multiple falls recently. Patient complains of shortness of breath. He has a history of bronchiectasis. Denies chest pain. As per wife patient fell few days ago and fell on the face, hitting his head. Patient was prescribed prednisone taper on 11/09/2014 for shortness of breath. In the ED patient found to be hypotensive, tachycardic, with leukocytosis WBC 20,000  Patient expander today, he is more awake and alert, he was assisted out of bed to chair.  Objective: Filed Vitals:   11/18/14 1312  BP: 120/80  Pulse: 100  Temp: 98.3 F (36.8 C)  Resp:     Intake/Output Summary (Last 24 hours) at 11/18/14 1712 Last data filed at 11/18/14 1350  Gross per 24 hour  Intake    240 ml  Output    700 ml  Net   -460 ml   Filed Weights   11/14/14 1500 11/16/14 0400  Weight: 81.6 kg (179 lb 14.3 oz) 82.7 kg (182 lb 5.1 oz)    Exam:   General:  He was assisted out of bed to chair, remains a 2 person assist  Cardiovascular: S1-S2 irregular   Respiratory: Clear to auscultation bilaterally   Abdomen Soft, nontender, no organomegaly   Musculoskeletal:  no cyanosis/clubbing/edema of the lower extremities, has generalized weakness, 2 person assist to get out of bed.  Data Reviewed: Basic  Metabolic Panel:  Recent Labs Lab 11/14/14 0953 11/14/14 1650 11/15/14 0413 11/16/14 0345 11/17/14 0340  11/18/14 0525  NA 130* 130* 135 135 137 137  K 3.8 2.9* 3.5 3.0* 3.1* 3.3*  CL 91* 95* 101 101 104 101  CO2 20* 19* GLUCOSE 273* 242* 137* 144* 103* 128*  BUN CREATININE 0.92 0.76 0.92 0.81 0.85 0.77  CALCIUM 8.0* 7.2* 7.2* 7.5* 7.6* 8.4*  MG 1.3*  --   --   --   --   --    Liver Function Tests:  Recent Labs Lab 11/14/14 0953 11/15/14 0413 11/16/14 0345  AST 156* 122* 112*  ALT 86* 65* 60  ALKPHOS 151* 107 99  BILITOT 1.4* 2.1* 2.3*  PROT 7.1 6.1* 6.2*  ALBUMIN 3.7 3.0* 3.0*   No results for input(s): LIPASE, AMYLASE in the last 168 hours. No results for input(s): AMMONIA in the last 168 hours. CBC:  Recent Labs Lab 11/14/14 0953 11/15/14 0413 11/16/14 0345 11/17/14 0340 11/18/14 0525  WBC 20.0* 18.7* 12.4* 8.9 8.3  NEUTROABS 18.2*  --   --   --   --   HGB 15.4 12.0* 11.7* 11.7* 12.9*  HCT 43.6 34.9* 34.7* 34.3* 39.0  MCV 92.0 92.6 94.8 95.5 95.8  PLT 112* 84* 72* 87* 99*   Cardiac Enzymes:  Recent Labs Lab 11/14/14 0953  TROPONINI <0.03   BNP (last 3 results)  Recent Labs  06/26/14 1200 07/18/14 0018 11/14/14 0953  BNP 574.4* 554.8* 49.7    ProBNP (last 3 results) No results for input(s): PROBNP in the last 8760 hours.  CBG:  Recent Labs Lab 11/17/14 1621 11/17/14 2137 11/18/14 0728 11/18/14 1123 11/18/14 1656  GLUCAP 111* 234* 122* 164* 164*    Recent Results (from the past 240 hour(s))  Culture, blood (routine x 2)     Status: None   Collection Time: 11/14/14  1:15 PM  Result Value Ref Range Status   Specimen Description BLOOD RIGHT HAND  Final   Special Requests BOTTLES DRAWN AEROBIC AND ANAEROBIC 5CC   Final   Culture  Setup Time   Final    GRAM POSITIVE COCCI IN CLUSTERS IN BOTH AEROBIC AND ANAEROBIC BOTTLES CRITICAL RESULT CALLED TO, READ BACK BY AND VERIFIED WITH: S [facility] 11/15/14 @ 1102 M VESTAL    Culture   Final    STAPHYLOCOCCUS SPECIES (COAGULASE NEGATIVE) THE SIGNIFICANCE OF  ISOLATING THIS ORGANISM FROM A SINGLE SET OF BLOOD CULTURES WHEN MULTIPLE SETS ARE DRAWN IS UNCERTAIN. PLEASE NOTIFY THE MICROBIOLOGY DEPARTMENT WITHIN ONE WEEK IF SPECIATION AND SENSITIVITIES ARE REQUIRED. Performed at [facility]    Report Status 11/16/2014 FINAL  Final  Culture, blood (routine x 2)     Status: None (Preliminary result)   Collection Time: 11/14/14  1:26 PM  Result Value Ref Range Status   Specimen Description BLOOD LEFT HAND  Final   Special Requests BOTTLES DRAWN AEROBIC AND ANAEROBIC 5CC   Final   Culture   Final    NO GROWTH 4 DAYS Performed at [facility]    Report Status PENDING  Incomplete  MRSA PCR Screening     Status: Abnormal   Collection Time: 11/14/14  2:55 PM  Result Value Ref Range Status   MRSA by PCR POSITIVE (A) NEGATIVE Final    Comment:        The GeneXpert MRSA Assay (FDA approved for NASAL specimens only), is  one component of a comprehensive MRSA colonization surveillance program. It is not intended to diagnose MRSA infection nor to guide or monitor treatment for MRSA infections. RESULT CALLED TO, READ BACK BY AND VERIFIED WITH: LISA FREI,RN [id number] @ 1608 BY J SCOTTON   Urine culture     Status: None   Collection Time: 11/14/14  5:20 PM  Result Value Ref Range Status   Specimen Description URINE, RANDOM  Final   Special Requests NONE  Final   Culture   Final    NO GROWTH 1 DAY Performed at [facility]    Report Status 11/15/2014 FINAL  Final  C difficile quick screen w PCR reflex     Status: Abnormal   Collection Time: 11/16/14  2:00 AM  Result Value Ref Range Status   C Diff antigen POSITIVE (A) NEGATIVE Final   C Diff toxin NEGATIVE NEGATIVE Final   C Diff interpretation   Final    C. difficile present, but toxin not detected. This indicates colonization. In most cases, this does not require treatment. If patient has signs and symptoms consistent with colitis, consider treatment. Requires ENTERIC  precautions.     Studies: No results found.  Scheduled Meds: . antiseptic oral rinse  7 mL Mouth Rinse BID  . aspirin EC  81 mg Oral Daily  . Chlorhexidine Gluconate Cloth  6 each Topical Q0600  . ciprofloxacin  1 drop Both Eyes Q2H while awake  . diltiazem  180 mg Oral Daily  . DULoxetine  30 mg Oral Daily  . enoxaparin (LOVENOX) injection  40 mg Subcutaneous Q24H  . folic acid  1 mg Oral Daily  . insulin aspart  0-9 Units Subcutaneous TID WC  . ipratropium  0.5 mg Nebulization Q6H  . levalbuterol  0.63 mg Nebulization Q6H  . LORazepam  0-4 mg Intravenous Q12H  . multivitamin with minerals  1 tablet Oral Daily  . mupirocin ointment  1 application Nasal BID  . potassium chloride  40 mEq Oral Once  . thiamine  100 mg Oral Daily   Or  . thiamine  100 mg Intravenous Daily   Continuous Infusions:   Active Problems:   HTN (hypertension)   Chronic diastolic heart failure (HCC)   Alcohol abuse   Sepsis (HCC)   Atrial fibrillation with rapid ventricular response (HCC)    Time spent: 15 min    Jeralyn Bennett  [facility] Pager [phone]. If 7PM-7AM, please contact night-coverage at [email or web address], password [facility] 11/18/2014, 5:12 PM  LOS: 4 days

## 2014-11-18 NOTE — Care Management Note (Signed)
Case Management Note  Patient Details  Name: Juan [name] MRN: [id number] Date of Birth: 1950/02/19  Subjective/Objective:    etoh-ciwa protocal.PT-SNF.   CSW following for SNF.             Action/Plan:d/c SNF   Expected Discharge Date:                  Expected Discharge Plan:  Skilled Nursing Facility  In-House Referral:  Clinical Social Work  Discharge planning Services  CM Consult  Post Acute Care Choice:    Choice offered to:     DME Arranged:    DME Agency:     HH Arranged:    HH Agency:     Status of Service:  In process, will continue to follow  Medicare Important Message Given:    Date Medicare IM Given:    Medicare IM give by:    Date Additional Medicare IM Given:    Additional Medicare Important Message give by:     If discussed at Long Length of Stay Meetings, dates discussed:    Additional Comments:  Lanier Clam, RN 11/18/2014, 2:54 PM

## 2014-11-19 LAB — CBC
HEMATOCRIT: 37.9 % — AB (ref 39.0–52.0)
Hemoglobin: 12.7 g/dL — ABNORMAL LOW (ref 13.0–17.0)
MCH: 31.8 pg (ref 26.0–34.0)
MCHC: 33.5 g/dL (ref 30.0–36.0)
MCV: 95 fL (ref 78.0–100.0)
PLATELETS: 111 10*3/uL — AB (ref 150–400)
RBC: 3.99 MIL/uL — AB (ref 4.22–5.81)
RDW: 15.6 % — AB (ref 11.5–15.5)
WBC: 9.5 10*3/uL (ref 4.0–10.5)

## 2014-11-19 LAB — CULTURE, BLOOD (ROUTINE X 2): Culture: NO GROWTH

## 2014-11-19 LAB — GLUCOSE, CAPILLARY
GLUCOSE-CAPILLARY: 151 mg/dL — AB (ref 65–99)
Glucose-Capillary: 135 mg/dL — ABNORMAL HIGH (ref 65–99)
Glucose-Capillary: 185 mg/dL — ABNORMAL HIGH (ref 65–99)
Glucose-Capillary: 134 mg/dL — ABNORMAL HIGH (ref 65–99)

## 2014-11-19 LAB — BASIC METABOLIC PANEL
Anion gap: 9 (ref 5–15)
BUN: 8 mg/dL (ref 6–20)
CHLORIDE: 100 mmol/L — AB (ref 101–111)
CO2: 28 mmol/L (ref 22–32)
CREATININE: 0.94 mg/dL (ref 0.61–1.24)
Calcium: 8.9 mg/dL (ref 8.9–10.3)
Glucose, Bld: 140 mg/dL — ABNORMAL HIGH (ref 65–99)
POTASSIUM: 3.7 mmol/L (ref 3.5–5.1)
SODIUM: 137 mmol/L (ref 135–145)

## 2014-11-19 MED ORDER — POTASSIUM CHLORIDE CRYS ER 20 MEQ PO TBCR
40.0000 meq | EXTENDED_RELEASE_TABLET | Freq: Once | ORAL | Status: AC
  Administered 2014-11-19: 40 meq via ORAL
  Filled 2014-11-19: qty 2

## 2014-11-19 MED ORDER — LEVALBUTEROL HCL 0.63 MG/3ML IN NEBU
0.6300 mg | INHALATION_SOLUTION | Freq: Three times a day (TID) | RESPIRATORY_TRACT | Status: DC
  Administered 2014-11-19 – 2014-11-22 (×10): 0.63 mg via RESPIRATORY_TRACT
  Filled 2014-11-19 (×10): qty 3

## 2014-11-19 MED ORDER — NICOTINE 21 MG/24HR TD PT24
21.0000 mg | MEDICATED_PATCH | Freq: Every day | TRANSDERMAL | Status: DC
  Administered 2014-11-19 – 2014-11-22 (×4): 21 mg via TRANSDERMAL
  Filled 2014-11-19 (×4): qty 1

## 2014-11-19 MED ORDER — ACETAMINOPHEN 325 MG PO TABS
650.0000 mg | ORAL_TABLET | Freq: Four times a day (QID) | ORAL | Status: DC | PRN
  Administered 2014-11-19 – 2014-11-21 (×4): 650 mg via ORAL
  Filled 2014-11-19 (×4): qty 2

## 2014-11-19 MED ORDER — IPRATROPIUM BROMIDE 0.02 % IN SOLN
0.5000 mg | Freq: Three times a day (TID) | RESPIRATORY_TRACT | Status: DC
  Administered 2014-11-19 – 2014-11-22 (×10): 0.5 mg via RESPIRATORY_TRACT
  Filled 2014-11-19 (×10): qty 2.5

## 2014-11-19 NOTE — Clinical Social Work Note (Addendum)
Pt still working on his withdrawals. (CIWA) Per MD pt getting IV ativan today and should be ready to discharge tomorrow. Pt refusing SNF and will be discharging to home at discharge.  Juan Buba, LCSW [facility] Clinical Social Worker - Weekend Coverage cell #: [phone]

## 2014-11-19 NOTE — Progress Notes (Signed)
[facility] PROGRESS NOTE  Juan [name]:[id number] DOB: 12-Nov-1950 DOA: 11/14/2014 PCP: Renold Don, MD  Assessment/Plan:  Sepsis -Present on admission, evidenced by a white count of 20,000,  heart rate of 137, respiratory rate of 31, lactate of 3.4  -Started on vancomycin and Zosyn on admission.  -1-2 blood cultures growing gram-positive cocci in clusters with microbiology reporting quadrant was negative staphylococcus species. -Chest x-ray did not reveal acute infiltrate, urinalysis negative. -On 11/16/2014 discontinued broad-spectrum IV antimicrobial therapy. He has remained afebrile for the past 72 hours, continues to show improvement. -He remains hemodynamically stable -No indication for further broad spectrum AB's.   History of alcohol abuse with withdrawals -Initially patient presenting with acute alcohol intoxication, developing withdrawals over the course of this hospitalization. -RN reporting he did receive a dose of IV ativan over night.  -Appears stable during my encounter  Alcohol ketoacidosis -Patient came with alcoholic ketoacidosis with anion gap of 19 likely secondary to starvation ketosis in setting of alcohol abuse -Lab stable  Recurrent falls -Patient has a recurrent fall , likely related to alcohol abuse and resultant severe malnutrition -CT head negative for any intracranial bleed -Physical therapy consultation placed -I spoke to him at length about his current condition, namely significant muscle weakness and ataxia that likely resulted from ETOH abuse. I explained to him that he would benefit from rehabilitation at a SNF for 3 weeks.  -He did not seem to like this plan. I suspect he is inclined to go home and start drinking again. He wife stated that she would not be able to care for him at home.  -Challenging situation, I discussed case with SW. Will set up meeting with patient and his wife tomorrow. I don't think he requires hospitalization from  a medical stand point past tomorrow.   Atrial fibrillation -Patient initially presented in atrial fibrillation with rapid ventricular response. -He is now rate controlled, has been seen by cardiology and not felt to be good candidate for anticoagulation due to alcohol abuse and recurrent falls. -On 11/18/2014 his cardizem was changed to Cardizem CD 180 mg PO q daily  -Converted to NSR, continue Cardizem CD 180 mg po q daily  Hypokalemia -Potassium improved to 3.7  Code Status: Full code Family Communication: Discussed with wife at bedside Disposition Plan: I think he would benefit from rehab at [facility]. Patient not happy with this plan. Don't see need for hospitalization past tomorrow.    Consultants:  Cardiology  Procedures:  None  Antibiotics:  Vancomycin stopped on 11/16/2014  Zosyn stopped on 11/16/2014  HPI/Subjective: 64 year old male who  has a past medical history of CHF (congestive heart failure) (HCC) (10/2010); Alcoholism (HCC); Allergy; Anxiety; Depression; Asthma; Hyperlipidemia; Hypertension; Seizures (HCC); and COPD (chronic obstructive pulmonary disease) (HCC). Today was brought to the hospital after patient was found lying on the porch. Patient has a history of chronic alcohol abuse and had multiple falls recently. Patient complains of shortness of breath. He has a history of bronchiectasis. Denies chest pain. As per wife patient fell few days ago and fell on the face, hitting his head. Patient was prescribed prednisone taper on 11/09/2014 for shortness of breath. In the ED patient found to be hypotensive, tachycardic, with leukocytosis WBC 20,000  Patient expander today, he is more awake and alert, he was assisted out of bed to chair.  Objective: Filed Vitals:   11/19/14 1439  BP: 134/82  Pulse: 110  Temp: 98.3 F (36.8 C)  Resp: 18    Intake/Output  Summary (Last 24 hours) at 11/19/14 1609 Last data filed at 11/19/14 0825  Gross per 24 hour  Intake     240 ml  Output    200 ml  Net     40 ml   Filed Weights   11/14/14 1500 11/16/14 0400  Weight: 81.6 kg (179 lb 14.3 oz) 82.7 kg (182 lb 5.1 oz)    Exam:   General:  He was assisted out of bed to chair, remains a 2 person assist  Cardiovascular: S1-S2 irregular   Respiratory: Clear to auscultation bilaterally   Abdomen Soft, nontender, no organomegaly   Musculoskeletal:  no cyanosis/clubbing/edema of the lower extremities, has generalized weakness, 2 person assist to get out of bed.  Data Reviewed: Basic Metabolic Panel:  Recent Labs Lab 11/14/14 0953  11/15/14 0413 11/16/14 0345 11/17/14 0340 11/18/14 0525 11/19/14 0535  NA 130*  < > 135 135 137 137 137  K 3.8  < > 3.5 3.0* 3.1* 3.3* 3.7  CL 91*  < > 101 101 104 101 100*  CO2 20*  < > GLUCOSE 273*  < > 137* 144* 103* 128* 140*  BUN 9  < > CREATININE 0.92  < > 0.92 0.81 0.85 0.77 0.94  CALCIUM 8.0*  < > 7.2* 7.5* 7.6* 8.4* 8.9  MG 1.3*  --   --   --   --   --   --   < > = values in this interval not displayed. Liver Function Tests:  Recent Labs Lab 11/14/14 0953 11/15/14 0413 11/16/14 0345  AST 156* 122* 112*  ALT 86* 65* 60  ALKPHOS 151* 107 99  BILITOT 1.4* 2.1* 2.3*  PROT 7.1 6.1* 6.2*  ALBUMIN 3.7 3.0* 3.0*   No results for input(s): LIPASE, AMYLASE in the last 168 hours. No results for input(s): AMMONIA in the last 168 hours. CBC:  Recent Labs Lab 11/14/14 0953 11/15/14 0413 11/16/14 0345 11/17/14 0340 11/18/14 0525 11/19/14 0535  WBC 20.0* 18.7* 12.4* 8.9 8.3 9.5  NEUTROABS 18.2*  --   --   --   --   --   HGB 15.4 12.0* 11.7* 11.7* 12.9* 12.7*  HCT 43.6 34.9* 34.7* 34.3* 39.0 37.9*  MCV 92.0 92.6 94.8 95.5 95.8 95.0  PLT 112* 84* 72* 87* 99* 111*   Cardiac Enzymes:  Recent Labs Lab 11/14/14 0953  TROPONINI <0.03   BNP (last 3 results)  Recent Labs  06/26/14 1200 07/18/14 0018 11/14/14 0953  BNP 574.4* 554.8* 49.7    ProBNP (last 3  results) No results for input(s): PROBNP in the last 8760 hours.  CBG:  Recent Labs Lab 11/18/14 1123 11/18/14 1656 11/18/14 2122 11/19/14 0735 11/19/14 1156  GLUCAP 164* 164* 150* 135* 151*    Recent Results (from the past 240 hour(s))  Culture, blood (routine x 2)     Status: None   Collection Time: 11/14/14  1:15 PM  Result Value Ref Range Status   Specimen Description BLOOD RIGHT HAND  Final   Special Requests BOTTLES DRAWN AEROBIC AND ANAEROBIC 5CC   Final   Culture  Setup Time   Final    GRAM POSITIVE COCCI IN CLUSTERS IN BOTH AEROBIC AND ANAEROBIC BOTTLES CRITICAL RESULT CALLED TO, READ BACK BY AND VERIFIED WITH: S [facility] 11/15/14 @ 1102 M VESTAL    Culture   Final    STAPHYLOCOCCUS SPECIES (COAGULASE NEGATIVE) THE SIGNIFICANCE OF ISOLATING THIS  ORGANISM FROM A SINGLE SET OF BLOOD CULTURES WHEN MULTIPLE SETS ARE DRAWN IS UNCERTAIN. PLEASE NOTIFY THE MICROBIOLOGY DEPARTMENT WITHIN ONE WEEK IF SPECIATION AND SENSITIVITIES ARE REQUIRED. Performed at [facility]    Report Status 11/16/2014 FINAL  Final  Culture, blood (routine x 2)     Status: None   Collection Time: 11/14/14  1:26 PM  Result Value Ref Range Status   Specimen Description BLOOD LEFT HAND  Final   Special Requests BOTTLES DRAWN AEROBIC AND ANAEROBIC 5CC   Final   Culture   Final    NO GROWTH 5 DAYS Performed at [facility]    Report Status 11/19/2014 FINAL  Final  MRSA PCR Screening     Status: Abnormal   Collection Time: 11/14/14  2:55 PM  Result Value Ref Range Status   MRSA by PCR POSITIVE (A) NEGATIVE Final    Comment:        The GeneXpert MRSA Assay (FDA approved for NASAL specimens only), is one component of a comprehensive MRSA colonization surveillance program. It is not intended to diagnose MRSA infection nor to guide or monitor treatment for MRSA infections. RESULT CALLED TO, READ BACK BY AND VERIFIED WITH: LISA FREI,RN [id number] @ 1608 BY J SCOTTON   Urine culture      Status: None   Collection Time: 11/14/14  5:20 PM  Result Value Ref Range Status   Specimen Description URINE, RANDOM  Final   Special Requests NONE  Final   Culture   Final    NO GROWTH 1 DAY Performed at [facility]    Report Status 11/15/2014 FINAL  Final  C difficile quick screen w PCR reflex     Status: Abnormal   Collection Time: 11/16/14  2:00 AM  Result Value Ref Range Status   C Diff antigen POSITIVE (A) NEGATIVE Final   C Diff toxin NEGATIVE NEGATIVE Final   C Diff interpretation   Final    C. difficile present, but toxin not detected. This indicates colonization. In most cases, this does not require treatment. If patient has signs and symptoms consistent with colitis, consider treatment. Requires ENTERIC precautions.     Studies: No results found.  Scheduled Meds: . antiseptic oral rinse  7 mL Mouth Rinse BID  . aspirin EC  81 mg Oral Daily  . Chlorhexidine Gluconate Cloth  6 each Topical Q0600  . ciprofloxacin  1 drop Both Eyes Q2H while awake  . diltiazem  180 mg Oral Daily  . DULoxetine  30 mg Oral Daily  . enoxaparin (LOVENOX) injection  40 mg Subcutaneous Q24H  . folic acid  1 mg Oral Daily  . insulin aspart  0-9 Units Subcutaneous TID WC  . ipratropium  0.5 mg Nebulization TID  . levalbuterol  0.63 mg Nebulization TID  . multivitamin with minerals  1 tablet Oral Daily  . nicotine  21 mg Transdermal Daily  . thiamine  100 mg Oral Daily   Or  . thiamine  100 mg Intravenous Daily   Continuous Infusions:   Active Problems:   HTN (hypertension)   Chronic diastolic heart failure (HCC)   Alcohol abuse   Sepsis (HCC)   Atrial fibrillation with rapid ventricular response (HCC)    Time spent: 25 min spent 20 min in face to face discussion with patient and his wife regarding rehab at [facility].     Jeralyn Bennett  [facility] Pager [phone]. If 7PM-7AM, please contact night-coverage at [email or web address], password [facility]  11/19/2014, 4:09 PM  LOS:  5 days

## 2014-11-19 NOTE — Clinical Social Work Note (Addendum)
Clinical Social Work Assessment  Patient Details  Name: Juan [name] MRN: [id number] Date of Birth: 11/02/1950  Date of referral:  11/19/14               Reason for consult:  Facility Placement, Substance Use/ETOH Abuse                Permission sought to share information with:   (Pt refused to provide release) Permission granted to share information::     Name::        Agency::     Relationship::     Contact Information:     Housing/Transportation Living arrangements for the past 2 months:  Single Family Home Source of Information:  Patient, Spouse Patient Interpreter Needed:    Criminal Activity/Legal Involvement Pertinent to Current Situation/Hospitalization:    Significant Relationships:  Spouse Lives with:  Spouse Do you feel safe going back to the place where you live?  Yes Need for family participation in patient care:     Care giving concerns:  [name] is concerned with pt coming home and going back to drinking again.   Social Worker assessment / plan:  CSW attempted to meet with pt at bedside but he was having breathing treatment.  CSW called and spoke with pt's wife to obtain information regarding discharge needs and history.  CSW prompted pt's wife to provide alcohol use and also needs for pt.  CSW encouraged pt's wife to explore options of discharge, SNF versus home health.  CSW went back into room after treatment was done and explained discharge options prompting pt to explore needs and discuss any questions he had.  CSW then went back into the room with RN case manager to prompt patient to make a decision regarding discharge. MD is providing IV ativan for withdrawals and pt will be ready to d/c tomorrow   Employment status:  Unemployed Insurance information:    PT Recommendations:  Skilled Nursing Facility Information / Referral to community resources:     Patient/Family's Response to care:  [name] concerned if pt comes home and continues drinking.  Pt's  wife stated that she tried to discuss SNF with pt and he go angry.  [name] stated that pt has always been opposed to all medical treatment and pt was in the ED the week before this admission with the same thing.  [name] stated that pt "falls so many times at home" due to his drinking.  [name] stated that she has gone to therapy but pt will not.  [name] stated that pt is in between [employer] and she does not have the provider number yet but would be willing to pay private for rehab if she had to. [name] stated that pt will not discuss discharge with her.  Pt stated to both CSW and case manager that he wanted to go home at discharge.    Patient/Family's Understanding of and Emotional Response to Diagnosis, Current Treatment, and Prognosis:  [name] appears to understand pt's health and diagnoses and is feeling hopeless that pt will do anything about his health or drinking.  Unclear if pt is aware how much his drinking is affecting his health because he continues to drink and will not seek any treatment.    Emotional Assessment Appearance:  Appears older than stated age Attitude/Demeanor/Rapport:    Affect (typically observed):  Irritable Orientation:  Oriented to Self, Oriented to Place, Oriented to  Time, Oriented to Situation Alcohol /  Substance use:    Psych involvement (Current and /or in the community):     Discharge Needs  Concerns to be addressed:  Denies Needs/Concerns at this time Readmission within the last 30 days:    Current discharge risk:  Substance Abuse Barriers to Discharge:      Annetta Maw, LCSW 11/19/2014, 4:11 PM

## 2014-11-20 LAB — GLUCOSE, CAPILLARY
GLUCOSE-CAPILLARY: 162 mg/dL — AB (ref 65–99)
GLUCOSE-CAPILLARY: 159 mg/dL — AB (ref 65–99)
Glucose-Capillary: 116 mg/dL — ABNORMAL HIGH (ref 65–99)
Glucose-Capillary: 167 mg/dL — ABNORMAL HIGH (ref 65–99)

## 2014-11-20 NOTE — Progress Notes (Signed)
[facility] PROGRESS NOTE  Juan [name] [id number] DOB: 01-04-1951 DOA: 11/14/2014 PCP: Renold Don, MD  Assessment/Plan:  Sepsis -Present on admission, evidenced by a white count of 20,000,  heart rate of 137, respiratory rate of 31, lactate of 3.4  -Started on vancomycin and Zosyn on admission.  -1-2 blood cultures growing gram-positive cocci in clusters with microbiology reporting quadrant was negative staphylococcus species. -Chest x-ray did not reveal acute infiltrate, urinalysis negative. -On 11/16/2014 discontinued broad-spectrum IV antimicrobial therapy. He has remained afebrile for the past 72 hours, continues to show improvement. -He remains hemodynamically stable -No indication for further broad spectrum AB's.   History of alcohol abuse with withdrawals -Initially patient presenting with acute alcohol intoxication, developing withdrawals over the course of this hospitalization. -Showing daily improvement  Alcohol ketoacidosis -Patient came with alcoholic ketoacidosis with anion gap of 19 likely secondary to starvation ketosis in setting of alcohol abuse -Lab stable  Recurrent falls -Patient has a recurrent fall , likely related to alcohol abuse and resultant severe malnutrition -CT head negative for any intracranial bleed -Physical therapy consultation placed -I spoke to him at length about his current condition, namely significant muscle weakness and ataxia that likely resulted from ETOH abuse. I explained to him that he would benefit from rehabilitation at a SNF for 3 weeks.  -He did not seem to like this plan. I suspect he is inclined to go home and start drinking again. He wife stated that she would not be able to care for him at home.  -He is interested in rehab at [facility]. He is a high fall risk as he still is a 2 person assist to get him out of bed.   Atrial fibrillation -Patient initially presented in atrial fibrillation with rapid ventricular  response. -He is now rate controlled, has been seen by cardiology and not felt to be good candidate for anticoagulation due to alcohol abuse and recurrent falls. -On 11/18/2014 his cardizem was changed to Cardizem CD 180 mg PO q daily  -Converted to NSR, continue Cardizem CD 180 mg po q daily  Hypokalemia -Potassium improved to 3.7  Code Status: Full code Family Communication: Discussed with wife at bedside  Disposition Plan: I think he would benefit from rehab at [facility]. Spoke to him about getting rehab at [facility] which he is agreeable to now. With his current state of deconditioning (unable to ambulate) discharging home would be unsafe for him.    Consultants:  Cardiology  Procedures:  None  Antibiotics:  Vancomycin stopped on 11/16/2014  Zosyn stopped on 11/16/2014  HPI/Subjective: 64 year old male who  has a past medical history of CHF (congestive heart failure) (HCC) (10/2010); Alcoholism (HCC); Allergy; Anxiety; Depression; Asthma; Hyperlipidemia; Hypertension; Seizures (HCC); and COPD (chronic obstructive pulmonary disease) (HCC). Today was brought to the hospital after patient was found lying on the porch. Patient has a history of chronic alcohol abuse and had multiple falls recently. Patient complains of shortness of breath. He has a history of bronchiectasis. Denies chest pain. As per wife patient fell few days ago and fell on the face, hitting his head. Patient was prescribed prednisone taper on 11/09/2014 for shortness of breath. In the ED patient found to be hypotensive, tachycardic, with leukocytosis WBC 20,000  He reports feeling better, states tolerating PO intake.   Objective: Filed Vitals:   11/20/14 0625  BP: 136/84  Pulse: 93  Temp: 98.2 F (36.8 C)  Resp: 18    Intake/Output Summary (Last 24 hours) at 11/20/14 1252 Last  data filed at 11/20/14 0900  Gross per 24 hour  Intake    720 ml  Output   1425 ml  Net   -705 ml   Filed Weights   11/14/14 1500  11/16/14 0400  Weight: 81.6 kg (179 lb 14.3 oz) 82.7 kg (182 lb 5.1 oz)    Exam:   General:  He is awake and alert in no acute distress.   Cardiovascular: S1-S2 irregular   Respiratory: Clear to auscultation bilaterally   Abdomen Soft, nontender, no organomegaly   Musculoskeletal:  no cyanosis/clubbing/edema of the lower extremities, has generalized weakness, 2 person assist to get out of bed.  Data Reviewed: Basic Metabolic Panel:  Recent Labs Lab 11/14/14 0953  11/15/14 0413 11/16/14 0345 11/17/14 0340 11/18/14 0525 11/19/14 0535  NA 130*  < > 135 135 137 137 137  K 3.8  < > 3.5 3.0* 3.1* 3.3* 3.7  CL 91*  < > 101 101 104 101 100*  CO2 20*  < > GLUCOSE 273*  < > 137* 144* 103* 128* 140*  BUN 9  < > CREATININE 0.92  < > 0.92 0.81 0.85 0.77 0.94  CALCIUM 8.0*  < > 7.2* 7.5* 7.6* 8.4* 8.9  MG 1.3*  --   --   --   --   --   --   < > = values in this interval not displayed. Liver Function Tests:  Recent Labs Lab 11/14/14 0953 11/15/14 0413 11/16/14 0345  AST 156* 122* 112*  ALT 86* 65* 60  ALKPHOS 151* 107 99  BILITOT 1.4* 2.1* 2.3*  PROT 7.1 6.1* 6.2*  ALBUMIN 3.7 3.0* 3.0*   No results for input(s): LIPASE, AMYLASE in the last 168 hours. No results for input(s): AMMONIA in the last 168 hours. CBC:  Recent Labs Lab 11/14/14 0953 11/15/14 0413 11/16/14 0345 11/17/14 0340 11/18/14 0525 11/19/14 0535  WBC 20.0* 18.7* 12.4* 8.9 8.3 9.5  NEUTROABS 18.2*  --   --   --   --   --   HGB 15.4 12.0* 11.7* 11.7* 12.9* 12.7*  HCT 43.6 34.9* 34.7* 34.3* 39.0 37.9*  MCV 92.0 92.6 94.8 95.5 95.8 95.0  PLT 112* 84* 72* 87* 99* 111*   Cardiac Enzymes:  Recent Labs Lab 11/14/14 0953  TROPONINI <0.03   BNP (last 3 results)  Recent Labs  06/26/14 1200 07/18/14 0018 11/14/14 0953  BNP 574.4* 554.8* 49.7    ProBNP (last 3 results) No results for input(s): PROBNP in the last 8760 hours.  CBG:  Recent Labs Lab  11/19/14 1156 11/19/14 1623 11/19/14 2207 11/20/14 0723 11/20/14 1150  GLUCAP 151* 185* 134* 116* 167*    Recent Results (from the past 240 hour(s))  Culture, blood (routine x 2)     Status: None   Collection Time: 11/14/14  1:15 PM  Result Value Ref Range Status   Specimen Description BLOOD RIGHT HAND  Final   Special Requests BOTTLES DRAWN AEROBIC AND ANAEROBIC 5CC   Final   Culture  Setup Time   Final    GRAM POSITIVE COCCI IN CLUSTERS IN BOTH AEROBIC AND ANAEROBIC BOTTLES CRITICAL RESULT CALLED TO, READ BACK BY AND VERIFIED WITH: S [facility] 11/15/14 @ 1102 M VESTAL    Culture   Final    STAPHYLOCOCCUS SPECIES (COAGULASE NEGATIVE) THE SIGNIFICANCE OF ISOLATING THIS ORGANISM FROM A SINGLE SET OF BLOOD CULTURES WHEN MULTIPLE SETS ARE DRAWN IS  UNCERTAIN. PLEASE NOTIFY THE MICROBIOLOGY DEPARTMENT WITHIN ONE WEEK IF SPECIATION AND SENSITIVITIES ARE REQUIRED. Performed at [facility]    Report Status 11/16/2014 FINAL  Final  Culture, blood (routine x 2)     Status: None   Collection Time: 11/14/14  1:26 PM  Result Value Ref Range Status   Specimen Description BLOOD LEFT HAND  Final   Special Requests BOTTLES DRAWN AEROBIC AND ANAEROBIC 5CC   Final   Culture   Final    NO GROWTH 5 DAYS Performed at [facility]    Report Status 11/19/2014 FINAL  Final  MRSA PCR Screening     Status: Abnormal   Collection Time: 11/14/14  2:55 PM  Result Value Ref Range Status   MRSA by PCR POSITIVE (A) NEGATIVE Final    Comment:        The GeneXpert MRSA Assay (FDA approved for NASAL specimens only), is one component of a comprehensive MRSA colonization surveillance program. It is not intended to diagnose MRSA infection nor to guide or monitor treatment for MRSA infections. RESULT CALLED TO, READ BACK BY AND VERIFIED WITH: LISA FREI,RN [id number] @ 1608 BY J SCOTTON   Urine culture     Status: None   Collection Time: 11/14/14  5:20 PM  Result Value Ref Range Status    Specimen Description URINE, RANDOM  Final   Special Requests NONE  Final   Culture   Final    NO GROWTH 1 DAY Performed at [facility]    Report Status 11/15/2014 FINAL  Final  C difficile quick screen w PCR reflex     Status: Abnormal   Collection Time: 11/16/14  2:00 AM  Result Value Ref Range Status   C Diff antigen POSITIVE (A) NEGATIVE Final   C Diff toxin NEGATIVE NEGATIVE Final   C Diff interpretation   Final    C. difficile present, but toxin not detected. This indicates colonization. In most cases, this does not require treatment. If patient has signs and symptoms consistent with colitis, consider treatment. Requires ENTERIC precautions.     Studies: No results found.  Scheduled Meds: . antiseptic oral rinse  7 mL Mouth Rinse BID  . aspirin EC  81 mg Oral Daily  . ciprofloxacin  1 drop Both Eyes Q2H while awake  . diltiazem  180 mg Oral Daily  . DULoxetine  30 mg Oral Daily  . enoxaparin (LOVENOX) injection  40 mg Subcutaneous Q24H  . folic acid  1 mg Oral Daily  . insulin aspart  0-9 Units Subcutaneous TID WC  . ipratropium  0.5 mg Nebulization TID  . levalbuterol  0.63 mg Nebulization TID  . multivitamin with minerals  1 tablet Oral Daily  . nicotine  21 mg Transdermal Daily  . thiamine  100 mg Oral Daily   Or  . thiamine  100 mg Intravenous Daily   Continuous Infusions:   Active Problems:   HTN (hypertension)   Chronic diastolic heart failure (HCC)   Alcohol abuse   Sepsis (HCC)   Atrial fibrillation with rapid ventricular response (HCC)    Time spent:15 min     Jeralyn Bennett  [facility] Pager [phone]. If 7PM-7AM, please contact night-coverage at [email or web address], password [facility] 11/20/2014, 12:52 PM  LOS: 6 days

## 2014-11-20 NOTE — Care Management Note (Addendum)
Case Management Note  Patient Details  Name: Juan [name] MRN: [id number] Date of Birth: [birth year]  Subjective/Objective:       Sepsis             Action/Plan: NCM spoke to pt and he wanted to dc home on 11/19/2014. Pt has decided to dc to SNF. CSW referral sent for dc to SNF.  Left message on vm with CSW, Almira Coaster.   Expected Discharge Date:  11/21/2014              Expected Discharge Plan:  Skilled Nursing Facility  In-House Referral:  Clinical Social Work  Discharge planning Services  CM Consult   Status of Service:  Completed, signed off  Medicare Important Message Given:    Date Medicare IM Given:    Medicare IM give by:    Date Additional Medicare IM Given:    Additional Medicare Important Message give by:     If discussed at Long Length of Stay Meetings, dates discussed:    Additional Comments:  Elliot Cousin, RN 11/20/2014, 3:47 PM

## 2014-11-21 DIAGNOSIS — E872 Acidosis: Secondary | ICD-10-CM | POA: Diagnosis present

## 2014-11-21 DIAGNOSIS — F10939 Alcohol use, unspecified with withdrawal, unspecified: Secondary | ICD-10-CM

## 2014-11-21 DIAGNOSIS — F10239 Alcohol dependence with withdrawal, unspecified: Secondary | ICD-10-CM

## 2014-11-21 DIAGNOSIS — E8729 Other acidosis: Secondary | ICD-10-CM | POA: Diagnosis present

## 2014-11-21 LAB — CREATININE, SERUM
Creatinine, Ser: 0.98 mg/dL (ref 0.61–1.24)
GFR calc non Af Amer: >60 mL/min (ref >60)

## 2014-11-21 LAB — GLUCOSE, CAPILLARY
GLUCOSE-CAPILLARY: 137 mg/dL — AB (ref 65–99)
GLUCOSE-CAPILLARY: 133 mg/dL — AB (ref 65–99)
Glucose-Capillary: 144 mg/dL — ABNORMAL HIGH (ref 65–99)
Glucose-Capillary: 157 mg/dL — ABNORMAL HIGH (ref 65–99)

## 2014-11-21 LAB — HEMOGLOBIN A1C
HEMOGLOBIN A1C: 6.2 % — AB (ref 4.8–5.6)
Mean Plasma Glucose: 131 mg/dL

## 2014-11-21 MED ORDER — DILTIAZEM HCL ER COATED BEADS 180 MG PO CP24: 180.0000 mg | ORAL_CAPSULE | Freq: Every day | ORAL | Status: DC

## 2014-11-21 MED ORDER — ACETAMINOPHEN 325 MG PO TABS: 650.0000 mg | ORAL_TABLET | Freq: Four times a day (QID) | ORAL | Status: DC | PRN

## 2014-11-21 MED ORDER — DIAZEPAM 5 MG PO TABS: ORAL_TABLET | ORAL | Status: DC

## 2014-11-21 NOTE — Progress Notes (Signed)
CSW and CSW colleague spoke with patient and patient wife. At first patient was refusing of skilled nursing, then after speaking with his wife was agreeable.   Pt wife has pending [employer] insurance with 60 days skilled nursing days and out of network coverage 80/20. CSW contacted facilities in network, [facility] and and [facility] however none of the facilities able to offer a bed at this time.   Pt family working on private pay plan for [employer] in the morning. However, if unable to go private pay then patient family will need to go home with private pay home health and private duty nursing.   Olga Coaster, LCSW  Clinical Social Work  [employer] [phone] Covering med psych

## 2014-11-21 NOTE — Progress Notes (Signed)
Physical Therapy Treatment Patient Details Name: Juan [name] MRN: [id number] DOB: 25-Nov-1950 Today's Date: 11/21/2014    History of Present Illness 64 year old male who  has a past medical history of CHF (congestive heart failure) (HCC) (10/2010); Alcoholism (HCC); Allergy; Anxiety; Depression; Asthma; Hyperlipidemia; Hypertension; Seizures (HCC); EtOH abuse and COPD . Pt was found down on front porch by his wife. She reports multiple falls at baseline but there's been an increase in falls recently.    PT Comments    +2 mod assist for bed to recliner transfer which is less assist compared to previous PT session. He was able to take a few shuffling steps with RW to recliner, with +2 assist for balance and safety.  Pt remains unsafe to DC home. SNF recommended.   Follow Up Recommendations  SNF;Supervision/Assistance - 24 hour     Equipment Recommendations  Other (comment) (to be determined)    Recommendations for Other Services       Precautions / Restrictions Precautions Precautions: Fall Precaution Comments: multiple falls for several months Restrictions Weight Bearing Restrictions: No    Mobility  Bed Mobility Overal bed mobility: Modified Independent             General bed mobility comments: mod I sit to supine using bedrail  Transfers Overall transfer level: Needs assistance Equipment used: Rolling walker (2 wheeled) Transfers: Sit to/from Stand Sit to Stand: +2 physical assistance;Mod assist Stand pivot transfers: +2 physical assistance;Mod assist       General transfer comment: improved transfer today, able to stand with RW and +2 assist to rise and steady, verbal cues for hand placement/safety  Ambulation/Gait Ambulation/Gait assistance: +2 physical assistance;+2 safety/equipment;Mod assist Ambulation Distance (Feet): 3 Feet Assistive device: Rolling walker (2 wheeled) Gait Pattern/deviations: Step-to pattern;Wide base of support;Decreased step  length - right;Decreased step length - left;Shuffle   Gait velocity interpretation: Below normal speed for age/gender General Gait Details: shuffling pivotal steps bed to recliner with RW, +2 assist for balance/safety, max verbal cues for safety/positioning in RW   Stairs            Wheelchair Mobility    Modified Rankin (Stroke Patients Only)       Balance Overall balance assessment: Needs assistance   Sitting balance-Leahy Scale: Fair     Standing balance support: Bilateral upper extremity supported Standing balance-Leahy Scale: Poor Standing balance comment: relies on BUE support                    Cognition Arousal/Alertness: Awake/alert Behavior During Therapy: WFL for tasks assessed/performed Overall Cognitive Status: Within Functional Limits for tasks assessed                      Exercises      General Comments        Pertinent Vitals/Pain Pain Assessment: No/denies pain    Home Living                      Prior Function            PT Goals (current goals can now be found in the care plan section) Acute Rehab PT Goals Patient Stated Goal: to get stronger, to go home PT Goal Formulation: With patient/family Time For Goal Achievement: 12/02/14 Potential to Achieve Goals: Fair Progress towards PT goals: Progressing toward goals    Frequency  Min 3X/week    PT Plan Current plan remains appropriate    Co-evaluation  End of Session Equipment Utilized During Treatment: Gait belt Activity Tolerance: Patient limited by fatigue Patient left: with call bell/phone within reach;in chair;with chair alarm set     Time: 1052-1110 PT Time Calculation (min) (ACUTE ONLY): 18 min  Charges:  $Therapeutic Activity: 8-22 mins                    G Codes:      Ralene Bathe Kistler 11/21/2014, 11:15 AM [phone]

## 2014-11-21 NOTE — Discharge Summary (Addendum)
Physician Discharge Summary  Juan [name]:[id number] DOB: [birth year] DOA: 11/14/2014  PCP: Renold Don, MD  Admit date: 11/14/2014 Discharge date: 11/22/2014  Time spent: 35 minutes  Recommendations for Outpatient Follow-up:  1. Patient with history of alcoholism, treated for withdrawal during this hospitalization will be discharged on a  Valium taper 2. Follow-up on heart rates, history for A. fib with RVR during this hospitalization 3. Follow-up on BMP and CBC and 1 week  Discharge Diagnoses:  Active Problems:   HTN (hypertension)   Alcohol abuse   Atrial fibrillation with rapid ventricular response (HCC)   Metabolic acidosis, increased anion gap (IAG)   Alcohol withdrawal (HCC)   Discharge Condition: Stable  Diet recommendation: Heart healthy diet  Filed Weights   11/14/14 1500 11/16/14 0400  Weight: 81.6 kg (179 lb 14.3 oz) 82.7 kg (182 lb 5.1 oz)    History of present illness:  64 year old male who  has a past medical history of CHF (congestive heart failure) (HCC) (10/2010); Alcoholism (HCC); Allergy; Anxiety; Depression; Asthma; Hyperlipidemia; Hypertension; Seizures (HCC); and COPD (chronic obstructive pulmonary disease) (HCC). Today was brought to the hospital after patient was found lying on the porch. Patient has a history of chronic alcohol abuse and had multiple falls recently. Patient complains of shortness of breath. He has a history of bronchiectasis. Denies chest pain. As per wife patient fell few days ago and fell on the face, hitting his head. Patient was prescribed prednisone taper on 11/09/2014 for shortness of breath. In the ED patient found to be hypotensive, tachycardic, with leukocytosis WBC 20,000.   Hospital Course:  Juan [name] is a 64 year old with a past medical history of chronic alcoholism, admitted to the medicine service on 11/14/2014 when he presented with recurrent falls, found down on the porch. Initial lab work performed in the  emergency department revealed an anion gap metabolic acidosis, hypokalemia and hyponatremia. His lactic acid was also elevated at 3.4. Metabolic acidosis likely secondary to starvation ketosis in setting of chronic alcoholism. He had a white count of 20,000 as initially there was initially concern for sepsis. Patient was admitted to the step down unit where he was started on IV fluid resuscitation and empiric IV broad spectrum antimicrobial therapy. His chest x-ray and urinalysis were unremarkable. Radiology reported that chest x-ray did not reveal focal parenchymal opacity.  Blood cultures grew 1 out of 2 coagulase negative Staphylococcus species, unclear significance of this as this could have represented a contaminant. His white count normalized in the following days with resolution of metabolic acidosis. I felt his elevated white count was likely related to ketoacidosis and profound dehydration. Broad-spectrum IV antimicrobial therapy was discontinued, as he was monitored off of antibiotics. He remained afebrile and hemodynamically stable during this hospitalization.   Other issues addressed in his hospitalization include atrial fibrillation with rapid ventricular response likely secondary to alcohol abuse. 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 It appears that at one point he wore a life vest for ventricular tachycardia. Follow-up transthoracic echocardiograms have shown improvement to ejection fraction. For his A. fib he was treated with IV Cardizem which was transitioned to oral Cardizem. He was not felt to be an appropriate anticoagulation candidate given his frequent falls and chronic alcoholism.  Electrolytes were replaced during this hospitalization. Physical therapy was consulted as it was felt he would benefit from skilled nursing facility placement. He remained profoundly weak and required 2 person assist to get him out of bed to chair.  I spoke to him about  rehabilitation options. Initially he declined rehabilitation and expresses desire to go home. I explained to him that the home environment could be an unsafe place for him if he was unable to ambulate on his own. I explained that the effects of chronic alcohol abuse together with his hospitalization have led to significant unsteady gait, balance problems along with generalized muscle weakness.  SW consulted for SNF placement.    Addendum  After having multiple discussions with Juan [name] as well as with social work it appears that he has refused skilled nursing facility placement. He has pending [employer] insurance. Social work contacted several facilities in the network. It appeared that patient would have to go private pay. Juan [name] and his wife opted to go home. I'll inform by case manager that home health services would also need to be private pay. He was given prescription for outpatient physical therapy. Thus patient was discharged to his home on 11/22/2014.  Consultations:  Cardiology  Physical therapy  Social work  Discharge Exam: Filed Vitals:   11/21/14 0603  BP: 134/80  Pulse: 98  Temp: 98.6 F (37 C)  Resp: 19     General: He is awake and alert in no acute distress, sitting at the bedside having his breakfast.    Cardiovascular: S1-S2 irregular   Respiratory: Clear to auscultation bilaterally   Abdomen Soft, nontender, non-distended, tolerating PO   Musculoskeletal: no cyanosis/clubbing/edema of the lower extremities, has generalized weakness  Discharge Instructions   Discharge Instructions    Call MD for:  difficulty breathing, headache or visual disturbances    Complete by:  As directed      Call MD for:  extreme fatigue    Complete by:  As directed      Call MD for:  hives    Complete by:  As directed      Call MD for:  persistant dizziness or light-headedness    Complete by:  As directed      Call MD for:  persistant nausea and vomiting     Complete by:  As directed      Call MD for:  redness, tenderness, or signs of infection (pain, swelling, redness, odor or green/yellow discharge around incision site)    Complete by:  As directed      Call MD for:  severe uncontrolled pain    Complete by:  As directed      Call MD for:  temperature >100.4    Complete by:  As directed      Call MD for:    Complete by:  As directed      Diet - low sodium heart healthy    Complete by:  As directed      Increase activity slowly    Complete by:  As directed           Current Discharge Medication List    START taking these medications   Details  diazepam (VALIUM) 5 MG tablet Take 1 tab by mouth 3 times daily for 5 says, followed by 1 tab twice daily for 5 days, followed by 1 tab daily for 5 days, then stop. Quantity Sufficient for Taper. Qty: 30 tablet, Refills: 0    diltiazem (CARDIZEM CD) 180 MG 24 hr capsule Take 1 capsule (180 mg total) by mouth daily. Qty: 30 capsule, Refills: 0      CONTINUE these medications which have CHANGED   Details  acetaminophen (TYLENOL) 325 MG tablet  Take 2 tablets (650 mg total) by mouth every 6 (six) hours as needed for moderate pain. Qty: 30 tablet, Refills: 0      CONTINUE these medications which have NOT CHANGED   Details  albuterol (PROVENTIL) (2.5 MG/3ML) 0.083% nebulizer solution USE 3 MLS BY NEBULIZATION EVERY 6 HOURS AS NEEDED FOR WHEEZING OR SHORTNESS OF BREATH Qty: 360 mL, Refills: 1    aspirin EC 81 MG EC tablet Take 1 tablet (81 mg total) by mouth daily. Qty: 90 tablet, Refills: 0    atorvastatin (LIPITOR) 40 MG tablet Take 1 tablet (40 mg total) by mouth daily at 6 PM. Qty: 30 tablet, Refills: 6    COMBIVENT RESPIMAT 20-100 MCG/ACT AERS respimat INHALE TWO PUFFS BY MOUTH EVERY FOUR HOURS AS NEEDED FOR WHEEZING Qty: 1 Inhaler, Refills: 3    DULoxetine (CYMBALTA) 30 MG capsule TAKE ONE CAPSULE BY MOUTH ONE TIME DAILY Qty: 30 capsule, Refills: 3    fluticasone (FLONASE) 50  MCG/ACT nasal spray Use two sprays in each nostril daily Qty: 16 g, Refills: 11    Magnesium 500 MG TABS Take 500 mg by mouth daily.    montelukast (SINGULAIR) 10 MG tablet TAKE ONE TABLET BY MOUTH NIGHTLY AT BEDTIME Qty: 30 tablet, Refills: 3    Multiple Vitamin (MULTIVITAMIN WITH MINERALS) TABS tablet Take 1 tablet by mouth daily.    tiotropium (SPIRIVA) 18 MCG inhalation capsule Place 1 capsule (18 mcg total) into inhaler and inhale daily. Qty: 30 capsule, Refills: 6      STOP taking these medications     B-Complex CAPS      carvedilol (COREG) 25 MG tablet      clonazePAM (KLONOPIN) 0.5 MG tablet      ePHEDrine-GuaiFENesin (PRIMATENE ASTHMA) 12.5-200 MG TABS      furosemide (LASIX) 40 MG tablet      gabapentin (NEURONTIN) 300 MG capsule      lisinopril (PRINIVIL,ZESTRIL) 5 MG tablet      predniSONE (DELTASONE) 10 MG tablet      sodium chloride 1 G tablet      spironolactone (ALDACTONE) 25 MG tablet      triamcinolone (KENALOG) 0.025 % ointment      Vitamin D, Ergocalciferol, (DRISDOL) 50000 UNITS CAPS capsule      potassium chloride SA (K-DUR,KLOR-CON) 20 MEQ tablet        No Known Allergies Follow-up Information    Follow up with WALDEN,JEFF, MD In 2 weeks.   Specialty:  Family Medicine   Contact information:   [address] [place] [address] [phone]        The results of significant diagnostics from this hospitalization (including imaging, microbiology, ancillary and laboratory) are listed below for reference.    Significant Diagnostic Studies: Dg Chest 1 View  11/09/2014  CLINICAL DATA:  Acute onset of shortness of breath. Initial encounter. EXAM: CHEST 1 VIEW COMPARISON:  Chest radiograph performed 07/18/2014 FINDINGS: The lungs are well-aerated. Mild vascular congestion is noted. Mild left basilar atelectasis is seen. There is no evidence of pleural effusion or pneumothorax. The cardiomediastinal silhouette is within normal  limits. No acute osseous abnormalities are seen. IMPRESSION: Mild vascular congestion noted.  Mild left basilar atelectasis seen. Electronically Signed   By: Roanna Raider M.D.   On: 11/09/2014 02:52   Ct Head Wo Contrast  11/14/2014  CLINICAL DATA:  Pain following fall.  Ethanol intoxication. EXAM: CT HEAD WITHOUT CONTRAST TECHNIQUE: Contiguous axial images were obtained from the base of the  skull through the vertex without intravenous contrast. COMPARISON:  November 08, 2014 FINDINGS: There is age related volume loss. There is no intracranial mass, hemorrhage, extra-axial fluid collection, or midline shift. Gray-white compartments appear normal. No acute infarct evident. The bony calvarium appears intact. The mastoid air cells are clear. There is mucosal thickening in the left maxillary antrum as well as in the ethmoid air cells bilaterally. IMPRESSION: Age related volume loss. No intracranial mass, hemorrhage, or focal gray -white compartment lesions/acute appearing infarct. No extra-axial fluid collection. There are areas of paranasal sinus disease. Electronically Signed   By: Bretta Bang III M.D.   On: 11/14/2014 14:12   Ct Head Wo Contrast  11/09/2014  CLINICAL DATA:  Tripped and fell at home, with bruising at the nose and upper lip swelling. Initial encounter. EXAM: CT HEAD WITHOUT CONTRAST CT MAXILLOFACIAL WITHOUT CONTRAST TECHNIQUE: Multidetector CT imaging of the head and maxillofacial structures were performed using the standard protocol without intravenous contrast. Multiplanar CT image reconstructions of the maxillofacial structures were also generated. COMPARISON:  CT of the head performed 04/15/2014 FINDINGS: CT HEAD FINDINGS There is no evidence of acute infarction, mass lesion, or intra- or extra-axial hemorrhage on CT. The posterior fossa, including the cerebellum, brainstem and fourth ventricle, is within normal limits. The third and lateral ventricles, and basal ganglia are  unremarkable in appearance. The cerebral hemispheres are symmetric in appearance, with normal gray-white differentiation. No mass effect or midline shift is seen. There is no evidence of fracture; visualized osseous structures are unremarkable in appearance. The visualized portions of the orbits are within normal limits. Mucoperiosteal thickening is noted at the left maxillary sinus, and mild mucosal thickening is seen at the right maxillary sinus. The remaining paranasal sinuses and mastoid air cells are well-aerated. No significant soft tissue abnormalities are seen. CT MAXILLOFACIAL FINDINGS There is no evidence of fracture or dislocation. The maxilla and mandible appear intact. The nasal bone is unremarkable in appearance. Numerous maxillary and mandibular dental caries are noted. The orbits are intact bilaterally. Mucoperiosteal thickening is noted at the left maxillary sinus, and mild mucosal thickening is seen at the right maxillary sinus. The remaining visualized paranasal sinuses and mastoid air cells are well-aerated. Calcification is noted at the carotid bifurcations bilaterally. The parapharyngeal fat planes are preserved. The nasopharynx, oropharynx and hypopharynx are unremarkable in appearance. The visualized portions of the valleculae and piriform sinuses are grossly unremarkable. The parotid and submandibular glands are within normal limits. No cervical lymphadenopathy is seen. IMPRESSION: 1. No evidence of traumatic intracranial injury or fracture. 2. No evidence of fracture or dislocation with regard to the maxillofacial structures. 3. Numerous maxillary and mandibular dental caries noted. 4. Mucoperiosteal thickening at the left maxillary sinus, and mild mucosal thickening at the right maxillary sinus. 5. Calcification at the carotid bifurcations bilaterally. Carotid ultrasound would be helpful for further evaluation, when and as deemed clinically appropriate. Electronically Signed   By: Roanna Raider M.D.   On: 11/09/2014 00:54   Dg Chest Portable 1 View  11/14/2014  CLINICAL DATA:  Weakness, CHF, smoker EXAM: PORTABLE CHEST 1 VIEW COMPARISON:  11/09/2014 FINDINGS: Mild bilateral interstitial prominence likely chronic. There is no focal parenchymal opacity. There is no pleural effusion or pneumothorax. The heart and mediastinal contours are unremarkable. The osseous structures are unremarkable. IMPRESSION: No active disease. Electronically Signed   By: Elige Ko   On: 11/14/2014 10:00   Ct Maxillofacial Wo Cm  11/09/2014  CLINICAL DATA:  Tripped and  fell at home, with bruising at the nose and upper lip swelling. Initial encounter. EXAM: CT HEAD WITHOUT CONTRAST CT MAXILLOFACIAL WITHOUT CONTRAST TECHNIQUE: Multidetector CT imaging of the head and maxillofacial structures were performed using the standard protocol without intravenous contrast. Multiplanar CT image reconstructions of the maxillofacial structures were also generated. COMPARISON:  CT of the head performed 04/15/2014 FINDINGS: CT HEAD FINDINGS There is no evidence of acute infarction, mass lesion, or intra- or extra-axial hemorrhage on CT. The posterior fossa, including the cerebellum, brainstem and fourth ventricle, is within normal limits. The third and lateral ventricles, and basal ganglia are unremarkable in appearance. The cerebral hemispheres are symmetric in appearance, with normal gray-white differentiation. No mass effect or midline shift is seen. There is no evidence of fracture; visualized osseous structures are unremarkable in appearance. The visualized portions of the orbits are within normal limits. Mucoperiosteal thickening is noted at the left maxillary sinus, and mild mucosal thickening is seen at the right maxillary sinus. The remaining paranasal sinuses and mastoid air cells are well-aerated. No significant soft tissue abnormalities are seen. CT MAXILLOFACIAL FINDINGS There is no evidence of fracture or dislocation.  The maxilla and mandible appear intact. The nasal bone is unremarkable in appearance. Numerous maxillary and mandibular dental caries are noted. The orbits are intact bilaterally. Mucoperiosteal thickening is noted at the left maxillary sinus, and mild mucosal thickening is seen at the right maxillary sinus. The remaining visualized paranasal sinuses and mastoid air cells are well-aerated. Calcification is noted at the carotid bifurcations bilaterally. The parapharyngeal fat planes are preserved. The nasopharynx, oropharynx and hypopharynx are unremarkable in appearance. The visualized portions of the valleculae and piriform sinuses are grossly unremarkable. The parotid and submandibular glands are within normal limits. No cervical lymphadenopathy is seen. IMPRESSION: 1. No evidence of traumatic intracranial injury or fracture. 2. No evidence of fracture or dislocation with regard to the maxillofacial structures. 3. Numerous maxillary and mandibular dental caries noted. 4. Mucoperiosteal thickening at the left maxillary sinus, and mild mucosal thickening at the right maxillary sinus. 5. Calcification at the carotid bifurcations bilaterally. Carotid ultrasound would be helpful for further evaluation, when and as deemed clinically appropriate. Electronically Signed   By: Roanna Raider M.D.   On: 11/09/2014 00:54    Microbiology: Recent Results (from the past 240 hour(s))  Culture, blood (routine x 2)     Status: None   Collection Time: 11/14/14  1:15 PM  Result Value Ref Range Status   Specimen Description BLOOD RIGHT HAND  Final   Special Requests BOTTLES DRAWN AEROBIC AND ANAEROBIC 5CC   Final   Culture  Setup Time   Final    GRAM POSITIVE COCCI IN CLUSTERS IN BOTH AEROBIC AND ANAEROBIC BOTTLES CRITICAL RESULT CALLED TO, READ BACK BY AND VERIFIED WITH: [facility] 11/15/14 @ 1102 M VESTAL    Culture   Final    STAPHYLOCOCCUS SPECIES (COAGULASE NEGATIVE) THE SIGNIFICANCE OF ISOLATING THIS ORGANISM  FROM A SINGLE SET OF BLOOD CULTURES WHEN MULTIPLE SETS ARE DRAWN IS UNCERTAIN. PLEASE NOTIFY THE MICROBIOLOGY DEPARTMENT WITHIN ONE WEEK IF SPECIATION AND SENSITIVITIES ARE REQUIRED. Performed at [facility]    Report Status 11/16/2014 FINAL  Final  Culture, blood (routine x 2)     Status: None   Collection Time: 11/14/14  1:26 PM  Result Value Ref Range Status   Specimen Description BLOOD LEFT HAND  Final   Special Requests BOTTLES DRAWN AEROBIC AND ANAEROBIC 5CC   Final   Culture  Final    NO GROWTH 5 DAYS Performed at [facility]    Report Status 11/19/2014 FINAL  Final  MRSA PCR Screening     Status: Abnormal   Collection Time: 11/14/14  2:55 PM  Result Value Ref Range Status   MRSA by PCR POSITIVE (A) NEGATIVE Final    Comment:        The GeneXpert MRSA Assay (FDA approved for NASAL specimens only), is one component of a comprehensive MRSA colonization surveillance program. It is not intended to diagnose MRSA infection nor to guide or monitor treatment for MRSA infections. RESULT CALLED TO, READ BACK BY AND VERIFIED WITH: LISA FREI,RN [id number] @ 1608 BY J SCOTTON   Urine culture     Status: None   Collection Time: 11/14/14  5:20 PM  Result Value Ref Range Status   Specimen Description URINE, RANDOM  Final   Special Requests NONE  Final   Culture   Final    NO GROWTH 1 DAY Performed at [facility]    Report Status 11/15/2014 FINAL  Final  C difficile quick screen w PCR reflex     Status: Abnormal   Collection Time: 11/16/14  2:00 AM  Result Value Ref Range Status   C Diff antigen POSITIVE (A) NEGATIVE Final   C Diff toxin NEGATIVE NEGATIVE Final   C Diff interpretation   Final    C. difficile present, but toxin not detected. This indicates colonization. In most cases, this does not require treatment. If patient has signs and symptoms consistent with colitis, consider treatment. Requires ENTERIC precautions.     Labs: Basic Metabolic  Panel:  Recent Labs Lab 11/14/14 0953  11/15/14 0413 11/16/14 0345 11/17/14 0340 11/18/14 0525 11/19/14 0535 11/21/14 0540  NA 130*  < > 135 135 137 137 137  --   K 3.8  < > 3.5 3.0* 3.1* 3.3* 3.7  --   CL 91*  < > 101 101 104 101 100*  --   CO2 20*  < > --   GLUCOSE 273*  < > 137* 144* 103* 128* 140*  --   BUN 9  < > --   CREATININE 0.92  < > 0.92 0.81 0.85 0.77 0.94 0.98  CALCIUM 8.0*  < > 7.2* 7.5* 7.6* 8.4* 8.9  --   MG 1.3*  --   --   --   --   --   --   --   < > = values in this interval not displayed. Liver Function Tests:  Recent Labs Lab 11/14/14 0953 11/15/14 0413 11/16/14 0345  AST 156* 122* 112*  ALT 86* 65* 60  ALKPHOS 151* 107 99  BILITOT 1.4* 2.1* 2.3*  PROT 7.1 6.1* 6.2*  ALBUMIN 3.7 3.0* 3.0*   No results for input(s): LIPASE, AMYLASE in the last 168 hours. No results for input(s): AMMONIA in the last 168 hours. CBC:  Recent Labs Lab 11/14/14 0953 11/15/14 0413 11/16/14 0345 11/17/14 0340 11/18/14 0525 11/19/14 0535  WBC 20.0* 18.7* 12.4* 8.9 8.3 9.5  NEUTROABS 18.2*  --   --   --   --   --   HGB 15.4 12.0* 11.7* 11.7* 12.9* 12.7*  HCT 43.6 34.9* 34.7* 34.3* 39.0 37.9*  MCV 92.0 92.6 94.8 95.5 95.8 95.0  PLT 112* 84* 72* 87* 99* 111*   Cardiac Enzymes:  Recent Labs Lab 11/14/14 0953  TROPONINI <0.03  BNP: BNP (last 3 results)  Recent Labs  06/26/14 1200 07/18/14 0018 11/14/14 0953  BNP 574.4* 554.8* 49.7    ProBNP (last 3 results) No results for input(s): PROBNP in the last 8760 hours.  CBG:  Recent Labs Lab 11/20/14 0723 11/20/14 1150 11/20/14 1618 11/20/14 2212 11/21/14 0754  GLUCAP 116* 167* 162* 159* 144*       Signed:  Zephyr Ridley  [facility] 11/21/2014, 9:22 AM

## 2014-11-22 ENCOUNTER — Telehealth: Payer: Self-pay | Admitting: Family Medicine

## 2014-11-22 LAB — GLUCOSE, CAPILLARY
GLUCOSE-CAPILLARY: 101 mg/dL — AB (ref 65–99)
GLUCOSE-CAPILLARY: 171 mg/dL — AB (ref 65–99)

## 2014-11-22 NOTE — Progress Notes (Signed)
CSW spoke with patient regarding SNF placement options. Patient has officially declined SNF placement. Patient's wife has been informed and is thankful for CSW services. Case Management has been notified, as family will need Home Health. Please consult if patient needs assistance with transportation. CSW to sign off.   Fernande Boyden, [facility] Clinical Social Worker [place] [phone]

## 2014-11-22 NOTE — Progress Notes (Signed)
CM met with pt and wife at bedside to discuss [place] services. Pt is listed as having no insurance. Per wife she has applied for [employer], but has not received any card or information from them yet. This CM inquired on whether she could obtain a [place] ID number from the company so home health services could run it to pay for their home health. Wife states she is unable to provide [employer] ID number. Per [place] pt would not qualify for charity due to having insurance. Wife states they can pay out of pocket for rolling walker and would wait on the wheelchair. Pt asked this CM if he could go to outpatient PT once [employer] situation was worked out. RN to call MD for outpatient PT script for pt to take with him at DC. [place] DME rep informed of referral for RW. No other CM needs communicated. Marney Doctor RN,BSN,NCM [phone]

## 2014-11-22 NOTE — Telephone Encounter (Signed)
Pt called because he is in the hospital at [facility]. He would like to speak to the doctor about rehabilitation options. Please call him at the hospital [phone]. [email or web address]

## 2014-11-22 NOTE — Progress Notes (Signed)
Pt is being discharged home. Discharge instructions were given to patient and family. 2 paper prescriptions were given to the patient .

## 2014-11-23 NOTE — Telephone Encounter (Signed)
After checking his chart, it appears Juan [name] has already been discharged home.  I will touch base with him at his next appointment.

## 2014-11-30 ENCOUNTER — Other Ambulatory Visit: Payer: Self-pay | Admitting: Family Medicine

## 2014-12-06 ENCOUNTER — Encounter: Payer: Self-pay | Admitting: Family Medicine

## 2014-12-06 ENCOUNTER — Ambulatory Visit (INDEPENDENT_AMBULATORY_CARE_PROVIDER_SITE_OTHER): Payer: Managed Care, Other (non HMO) | Admitting: Family Medicine

## 2014-12-06 VITALS — BP 117/77 | HR 108 | Temp 98.5°F | Ht 68.0 in | Wt 183.8 lb

## 2014-12-06 DIAGNOSIS — F1020 Alcohol dependence, uncomplicated: Secondary | ICD-10-CM

## 2014-12-06 DIAGNOSIS — I5022 Chronic systolic (congestive) heart failure: Secondary | ICD-10-CM

## 2014-12-06 DIAGNOSIS — E876 Hypokalemia: Secondary | ICD-10-CM

## 2014-12-06 DIAGNOSIS — F419 Anxiety disorder, unspecified: Secondary | ICD-10-CM

## 2014-12-06 DIAGNOSIS — I4891 Unspecified atrial fibrillation: Secondary | ICD-10-CM

## 2014-12-06 DIAGNOSIS — I10 Essential (primary) hypertension: Secondary | ICD-10-CM | POA: Diagnosis not present

## 2014-12-06 DIAGNOSIS — R911 Solitary pulmonary nodule: Secondary | ICD-10-CM

## 2014-12-06 LAB — CBC WITH DIFFERENTIAL/PLATELET
BASOS ABS: 0.1 10*3/uL (ref 0.0–0.1)
Basophils Relative: 1 % (ref 0–1)
EOS PCT: 11 % — AB (ref 0–5)
Eosinophils Absolute: 1.1 10*3/uL — ABNORMAL HIGH (ref 0.0–0.7)
HEMATOCRIT: 39.2 % (ref 39.0–52.0)
Hemoglobin: 13.3 g/dL (ref 13.0–17.0)
LYMPHS ABS: 1.2 10*3/uL (ref 0.7–4.0)
LYMPHS PCT: 12 % (ref 12–46)
MCH: 31.3 pg (ref 26.0–34.0)
MCHC: 33.9 g/dL (ref 30.0–36.0)
MCV: 92.2 fL (ref 78.0–100.0)
MPV: 9.5 fL (ref 8.6–12.4)
Monocytes Absolute: 0.9 10*3/uL (ref 0.1–1.0)
Monocytes Relative: 9 % (ref 3–12)
NEUTROS ABS: 6.9 10*3/uL (ref 1.7–7.7)
NEUTROS PCT: 67 % (ref 43–77)
Platelets: 390 10*3/uL (ref 150–400)
RBC: 4.25 MIL/uL (ref 4.22–5.81)
RDW: 15 % (ref 11.5–15.5)
WBC: 10.3 10*3/uL (ref 4.0–10.5)

## 2014-12-06 MED ORDER — CLONAZEPAM 1 MG PO TABS: 1.0000 mg | ORAL_TABLET | Freq: Two times a day (BID) | ORAL | Status: DC | PRN

## 2014-12-06 NOTE — Assessment & Plan Note (Signed)
Checking thyroid today as well since he has concomitant tachycardia. Think this is mostly secondary to benzodiazepine withdrawal. He has been on chronic Klonopin since long before he met me, for the past 10 years or so. Restarting his twice a day Klonopin today. Stop the Valium.

## 2014-12-06 NOTE — Assessment & Plan Note (Signed)
Checking on joints today.

## 2014-12-06 NOTE — Patient Instructions (Addendum)
STOP the valium.  Restart the Klonopin.  STOP the diltiazem.  Restart the Carvedilol.  Come back to see me in 2 weeks to make sure your heart-rate is good, you're sleeping better, and that your anxiety is better.    Check CT -- lung nodule at next visit.

## 2014-12-06 NOTE — Assessment & Plan Note (Signed)
Currently asymptomatic. He is regular rhythm but tachycardic to 110, pulse check today. Stopping the Cardizem Restarting his carvedilol. I believe this will have other helpful effects besides just controlling his tachycardia. As noted above he does have some a reduced ejection fraction.

## 2014-12-06 NOTE — Assessment & Plan Note (Signed)
Continue home medications. This is controlled currently. Counseled to quit smoking entirely

## 2014-12-06 NOTE — Assessment & Plan Note (Signed)
He has put this off previously. We'll discuss with him again in 2 weeks.

## 2014-12-06 NOTE — Progress Notes (Signed)
Subjective:    Juan [name] is a 64 y.o. male who presents to [facility] today for hospital FU:  1.  Hospital FU:  Recently admitted for EtOHism.  Discharged on 11/21/14.  Patient admitted after being found down on his front porch by his wife. Has had multiple falls prior to being admitted.  Found to be in alcoholic ketoacidosis with profound dehydration. Since leaving the hospital, he's been doing fairly well. States he is profoundly weak but getting better "a little bit every day." He has refused home physical therapy. He is back to eating 3 meals a day. He needed a walker for the first is at home, they graduated to his walking stick, now he is walking without any assistance devices at all.  Initially he agreed to [facility] placement prior to discharge, however at the day of discharge he would've had to pay for skilled nursing facility placement secondary to insurance issues. Was discharged home on outpatient physical therapy.  2.  A fib w/ RVR:  Diagnosed last hospital stay. New problem for him.  Also with history of nonischemic cardiomyopathy secondary to his alcohol abuse. Started on Cardizem which was transitioned to oral prior to discharge. Not anticoagulation candidate secondary to chronic alcoholism and frequent falls.  His carvedilol was stopped from the hospital as well. Pertinently, no chest pain, palpitations.  Does have occasional dyspnea but this is relieved with use of his Combivent.   3.  Anxiety: States this is vastly worsened since leaving the hospital. He is on a Valium taper. He was on 5 mg 3 times a day for the first several days then down to twice a day and now does taking 5 mg once a day. States that since the taper started he is not sleeping well at night. Feels anxious "almost the whole day." No SI or HI.   ROS as above per HPI, otherwise neg.   The following portions of the patient's history were reviewed and updated as appropriate: allergies, current medications, past medical  history, family and social history, and problem list. Patient is a nonsmoker.    PMH reviewed.  Past Medical History  Diagnosis Date  . CHF (congestive heart failure) (HCC) 10/2010    ECHO:  EF 40%, Grade II diastolic dysfunction  . Alcoholism (HCC)   . Allergy     seasonal   . Anxiety   . Depression   . Asthma   . Hyperlipidemia   . Hypertension   . Seizures (HCC)   . COPD (chronic obstructive pulmonary disease) [facility])    Past Surgical History  Procedure Laterality Date  . Toenail excision Right 1967    Removal of ingrown toenail [Other]    Medications reviewed. Current Outpatient Prescriptions  Medication Sig Dispense Refill  . acetaminophen (TYLENOL) 325 MG tablet Take 2 tablets (650 mg total) by mouth every 6 (six) hours as needed for moderate pain. 30 tablet 0  . albuterol (PROVENTIL) (2.5 MG/3ML) 0.083% nebulizer solution USE 3 MLS BY NEBULIZATION EVERY 6 HOURS AS NEEDED FOR WHEEZING OR SHORTNESS OF BREATH 360 mL 1  . aspirin EC 81 MG EC tablet Take 1 tablet (81 mg total) by mouth daily. 90 tablet 0  . atorvastatin (LIPITOR) 40 MG tablet Take 1 tablet (40 mg total) by mouth daily at 6 PM. 30 tablet 6  . COMBIVENT RESPIMAT 20-100 MCG/ACT AERS respimat INHALE TWO PUFFS BY MOUTH EVERY FOUR HOURS AS NEEDED FOR WHEEZING 1 Inhaler 3  . diazepam (VALIUM) 5 MG tablet  Take 1 tab by mouth 3 times daily for 5 says, followed by 1 tab twice daily for 5 days, followed by 1 tab daily for 5 days, then stop. Quantity Sufficient for Taper. 30 tablet 0  . diltiazem (CARDIZEM CD) 180 MG 24 hr capsule Take 1 capsule (180 mg total) by mouth daily. 30 capsule 0  . DULoxetine (CYMBALTA) 30 MG capsule TAKE ONE CAPSULE BY MOUTH ONE TIME DAILY 30 capsule 3  . fluticasone (FLONASE) 50 MCG/ACT nasal spray Use two sprays in each nostril daily 16 g 11  . lisinopril (PRINIVIL,ZESTRIL) 5 MG tablet TAKE 1 TABLET BY MOUTH EVERY DAY 30 tablet 3  . Magnesium 500 MG TABS Take 500 mg by mouth daily.    .  montelukast (SINGULAIR) 10 MG tablet TAKE ONE TABLET BY MOUTH NIGHTLY AT BEDTIME 30 tablet 3  . Multiple Vitamin (MULTIVITAMIN WITH MINERALS) TABS tablet Take 1 tablet by mouth daily.    Marland Kitchen tiotropium (SPIRIVA) 18 MCG inhalation capsule Place 1 capsule (18 mcg total) into inhaler and inhale daily. 30 capsule 6  . [DISCONTINUED] albuterol (PROVENTIL,VENTOLIN) 90 MCG/ACT inhaler Inhale 2 puffs into the lungs every 6 (six) hours as needed for wheezing. 17 g 0   No current facility-administered medications for this visit.     Objective:   Physical Exam BP 117/77 mmHg  Pulse 108  Temp(Src) 98.5 F (36.9 C) (Oral)  Ht  (1.727 m)  Wt 183 lb 12.8 oz (83.371 kg)  BMI 27.95 kg/m2  SpO2 95% Gen:  Alert, cooperative patient who appears stated age in no acute distress.  Vital signs reviewed. HEENT: EOMI,  MMM Cardiac:  Tachycardic with regular rhythm. Heart rate is 110 on my check Pulm:  Diffuse rhonchi from mid-lungs down.  Notably prolonged expiratory phase.  Abd:  Soft/nontender.  Chronic hernia unchanged.    Exts: Non edematous BL  LE, warm and well perfused.  Neuro:  Balance -- able to walk without assistance device.  No evidence of loss of balance walking around clinic.    No results found for this or any previous visit (from the past 72 hour(s)).

## 2014-12-06 NOTE — Assessment & Plan Note (Signed)
He has not had a drink since leaving the hospital and his wife corroborates this. Congratulated him on this. Hopeful he will continue with cessation of alcohol.

## 2014-12-06 NOTE — Assessment & Plan Note (Signed)
Most recent echo with slight improvement in ejection fraction to 50%.

## 2014-12-07 ENCOUNTER — Encounter: Payer: Self-pay | Admitting: Family Medicine

## 2014-12-07 LAB — COMPREHENSIVE METABOLIC PANEL
ALK PHOS: 72 U/L (ref 40–115)
ALT: 19 U/L (ref 9–46)
AST: 21 U/L (ref 10–35)
Albumin: 4 g/dL (ref 3.6–5.1)
BILIRUBIN TOTAL: 0.4 mg/dL (ref 0.2–1.2)
BUN: 7 mg/dL (ref 7–25)
CALCIUM: 9.3 mg/dL (ref 8.6–10.3)
CO2: 25 mmol/L (ref 20–31)
Chloride: 101 mmol/L (ref 98–110)
Creat: 0.87 mg/dL (ref 0.70–1.25)
GLUCOSE: 95 mg/dL (ref 65–99)
Potassium: 4.8 mmol/L (ref 3.5–5.3)
Sodium: 136 mmol/L (ref 135–146)
TOTAL PROTEIN: 7 g/dL (ref 6.1–8.1)

## 2014-12-07 LAB — CALCIUM, IONIZED: Calcium, Ion: 1.31 mmol/L (ref 1.12–1.32)

## 2014-12-07 LAB — TSH: TSH: 1.895 u[IU]/mL (ref 0.350–4.500)

## 2014-12-13 ENCOUNTER — Telehealth: Payer: Self-pay | Admitting: Family Medicine

## 2014-12-13 NOTE — Telephone Encounter (Signed)
Pt is hyperventilating and SOB.  Feels this is related to a change in medication  Would like to talk to dr Gwendolyn Grant

## 2014-12-13 NOTE — Telephone Encounter (Signed)
Spoke directly to [place].  Intermittent symptoms.  Will discuss with front office staff that something like this needs to be routed to a nurse in the future for urgency.

## 2014-12-13 NOTE — Telephone Encounter (Signed)
Called and spoke to pt. Hyperventilation and SOB started last Tuesday after he took his meds. Doesn't have these symptoms all the time, only occasionally but they are not going away. Made an appt for Friday at 9:30 with Dr. Gwendolyn Grant. Sunday Spillers, CMA

## 2014-12-16 ENCOUNTER — Inpatient Hospital Stay (HOSPITAL_COMMUNITY)
Admission: AD | Admit: 2014-12-16 | Discharge: 2014-12-18 | DRG: 190 | Disposition: A | Discharge status: Home / Self Care | Payer: Managed Care, Other (non HMO) | Source: Ambulatory Visit | Attending: Family Medicine | Admitting: Family Medicine

## 2014-12-16 ENCOUNTER — Ambulatory Visit (INDEPENDENT_AMBULATORY_CARE_PROVIDER_SITE_OTHER): Payer: Managed Care, Other (non HMO) | Admitting: Family Medicine

## 2014-12-16 ENCOUNTER — Inpatient Hospital Stay (HOSPITAL_COMMUNITY): Payer: Managed Care, Other (non HMO)

## 2014-12-16 ENCOUNTER — Encounter (HOSPITAL_COMMUNITY): Payer: Self-pay | Admitting: General Practice

## 2014-12-16 ENCOUNTER — Encounter: Payer: Self-pay | Admitting: Family Medicine

## 2014-12-16 VITALS — BP 133/84 | HR 101 | Temp 97.6°F | Ht 68.0 in | Wt 186.2 lb

## 2014-12-16 DIAGNOSIS — R0600 Dyspnea, unspecified: Secondary | ICD-10-CM | POA: Diagnosis present

## 2014-12-16 DIAGNOSIS — F17210 Nicotine dependence, cigarettes, uncomplicated: Secondary | ICD-10-CM | POA: Diagnosis present

## 2014-12-16 DIAGNOSIS — R0602 Shortness of breath: Secondary | ICD-10-CM | POA: Diagnosis not present

## 2014-12-16 DIAGNOSIS — R296 Repeated falls: Secondary | ICD-10-CM | POA: Diagnosis present

## 2014-12-16 DIAGNOSIS — Z9181 History of falling: Secondary | ICD-10-CM | POA: Diagnosis not present

## 2014-12-16 DIAGNOSIS — J441 Chronic obstructive pulmonary disease with (acute) exacerbation: Principal | ICD-10-CM | POA: Diagnosis present

## 2014-12-16 DIAGNOSIS — I110 Hypertensive heart disease with heart failure: Secondary | ICD-10-CM | POA: Diagnosis present

## 2014-12-16 DIAGNOSIS — Z8249 Family history of ischemic heart disease and other diseases of the circulatory system: Secondary | ICD-10-CM | POA: Diagnosis not present

## 2014-12-16 DIAGNOSIS — Z820 Family history of epilepsy and other diseases of the nervous system: Secondary | ICD-10-CM | POA: Diagnosis not present

## 2014-12-16 DIAGNOSIS — J189 Pneumonia, unspecified organism: Secondary | ICD-10-CM | POA: Diagnosis present

## 2014-12-16 DIAGNOSIS — F419 Anxiety disorder, unspecified: Secondary | ICD-10-CM | POA: Diagnosis present

## 2014-12-16 DIAGNOSIS — F329 Major depressive disorder, single episode, unspecified: Secondary | ICD-10-CM | POA: Diagnosis present

## 2014-12-16 DIAGNOSIS — Z833 Family history of diabetes mellitus: Secondary | ICD-10-CM

## 2014-12-16 DIAGNOSIS — R0902 Hypoxemia: Secondary | ICD-10-CM | POA: Diagnosis present

## 2014-12-16 DIAGNOSIS — I4891 Unspecified atrial fibrillation: Secondary | ICD-10-CM | POA: Diagnosis present

## 2014-12-16 DIAGNOSIS — I5042 Chronic combined systolic (congestive) and diastolic (congestive) heart failure: Secondary | ICD-10-CM | POA: Diagnosis present

## 2014-12-16 DIAGNOSIS — F1020 Alcohol dependence, uncomplicated: Secondary | ICD-10-CM | POA: Diagnosis present

## 2014-12-16 HISTORY — DX: Headache, unspecified: R51.9

## 2014-12-16 HISTORY — DX: Headache: R51

## 2014-12-16 LAB — BRAIN NATRIURETIC PEPTIDE: B NATRIURETIC PEPTIDE 5: 13.8 pg/mL (ref 0.0–100.0)

## 2014-12-16 LAB — COMPREHENSIVE METABOLIC PANEL
ALBUMIN: 3 g/dL — AB (ref 3.5–5.0)
ALT: 26 U/L (ref 17–63)
AST: 34 U/L (ref 15–41)
Alkaline Phosphatase: 62 U/L (ref 38–126)
Anion gap: 14 (ref 5–15)
BILIRUBIN TOTAL: 0.3 mg/dL (ref 0.3–1.2)
BUN: 5 mg/dL — AB (ref 6–20)
CHLORIDE: 100 mmol/L — AB (ref 101–111)
CO2: 23 mmol/L (ref 22–32)
Calcium: 8.6 mg/dL — ABNORMAL LOW (ref 8.9–10.3)
Creatinine, Ser: 0.86 mg/dL (ref 0.61–1.24)
GFR calc Af Amer: >60 mL/min (ref >60)
GFR calc non Af Amer: >60 mL/min (ref >60)
GLUCOSE: 131 mg/dL — AB (ref 65–99)
POTASSIUM: 3.8 mmol/L (ref 3.5–5.1)
Sodium: 137 mmol/L (ref 135–145)
Total Protein: 7 g/dL (ref 6.5–8.1)

## 2014-12-16 LAB — BLOOD GAS, ARTERIAL
Acid-base deficit: 0.9 mmol/L (ref 0.0–2.0)
Bicarbonate: 23.1 mEq/L (ref 20.0–24.0)
DRAWN BY: 313061
O2 Content: 2 L/min
O2 SAT: 93.5 %
PO2 ART: 70.2 mmHg — AB (ref 80.0–100.0)
Patient temperature: 98.6
TCO2: 24.3 mmol/L (ref 0–100)
pCO2 arterial: 37.6 mmHg (ref 35.0–45.0)
pH, Arterial: 7.406 (ref 7.350–7.450)

## 2014-12-16 LAB — TROPONIN I: Troponin I: <0.03 ng/mL

## 2014-12-16 LAB — D-DIMER, QUANTITATIVE: D-Dimer, Quant: 0.95 ug/mL-FEU — ABNORMAL HIGH (ref 0.00–0.48)

## 2014-12-16 LAB — CBC
HEMATOCRIT: 38.2 % — AB (ref 39.0–52.0)
Hemoglobin: 12.7 g/dL — ABNORMAL LOW (ref 13.0–17.0)
MCH: 30.7 pg (ref 26.0–34.0)
MCHC: 33.2 g/dL (ref 30.0–36.0)
MCV: 92.3 fL (ref 78.0–100.0)
PLATELETS: 262 10*3/uL (ref 150–400)
RBC: 4.14 MIL/uL — ABNORMAL LOW (ref 4.22–5.81)
RDW: 14.5 % (ref 11.5–15.5)
WBC: 9.7 10*3/uL (ref 4.0–10.5)

## 2014-12-16 LAB — MRSA PCR SCREENING: MRSA BY PCR: POSITIVE — AB

## 2014-12-16 MED ORDER — CETYLPYRIDINIUM CHLORIDE 0.05 % MT LIQD
7.0000 mL | Freq: Two times a day (BID) | OROMUCOSAL | Status: DC
Start: 2014-12-16 — End: 2014-12-18
  Administered 2014-12-16 – 2014-12-18 (×4): 7 mL via OROMUCOSAL

## 2014-12-16 MED ORDER — SODIUM CHLORIDE 0.9 % IJ SOLN
3.0000 mL | Freq: Two times a day (BID) | INTRAMUSCULAR | Status: DC
  Administered 2014-12-16 – 2014-12-17 (×3): 3 mL via INTRAVENOUS

## 2014-12-16 MED ORDER — SODIUM CHLORIDE 0.9 % IJ SOLN: 3.0000 mL | Freq: Two times a day (BID) | INTRAMUSCULAR | Status: DC

## 2014-12-16 MED ORDER — DILTIAZEM HCL ER COATED BEADS 180 MG PO CP24
180.0000 mg | ORAL_CAPSULE | Freq: Every day | ORAL | Status: DC
Start: 2014-12-16 — End: 2014-12-18
  Administered 2014-12-16 – 2014-12-18 (×3): 180 mg via ORAL
  Filled 2014-12-16 (×3): qty 1

## 2014-12-16 MED ORDER — ACETAMINOPHEN 650 MG RE SUPP: 650.0000 mg | Freq: Four times a day (QID) | RECTAL | Status: DC | PRN

## 2014-12-16 MED ORDER — ADULT MULTIVITAMIN W/MINERALS CH
1.0000 | ORAL_TABLET | Freq: Every day | ORAL | Status: DC
  Administered 2014-12-16 – 2014-12-18 (×3): 1 via ORAL
  Filled 2014-12-16 (×3): qty 1

## 2014-12-16 MED ORDER — IOHEXOL 350 MG/ML SOLN
100.0000 mL | Freq: Once | INTRAVENOUS | Status: AC | PRN
  Administered 2014-12-16: 100 mL via INTRAVENOUS

## 2014-12-16 MED ORDER — LORAZEPAM 1 MG PO TABS
1.0000 mg | ORAL_TABLET | Freq: Four times a day (QID) | ORAL | Status: DC | PRN
  Administered 2014-12-16 – 2014-12-17 (×3): 1 mg via ORAL
  Filled 2014-12-16 (×3): qty 1

## 2014-12-16 MED ORDER — SODIUM CHLORIDE 0.9 % IV SOLN: 250.0000 mL | INTRAVENOUS | Status: DC | PRN

## 2014-12-16 MED ORDER — ACETAMINOPHEN 325 MG PO TABS: 650.0000 mg | ORAL_TABLET | Freq: Four times a day (QID) | ORAL | Status: DC | PRN

## 2014-12-16 MED ORDER — THIAMINE HCL 100 MG/ML IJ SOLN: 100.0000 mg | Freq: Every day | INTRAMUSCULAR | Status: DC

## 2014-12-16 MED ORDER — IPRATROPIUM BROMIDE 0.02 % IN SOLN
0.5000 mg | Freq: Once | RESPIRATORY_TRACT | Status: AC
  Administered 2014-12-16: 0.5 mg via RESPIRATORY_TRACT

## 2014-12-16 MED ORDER — ENOXAPARIN SODIUM 40 MG/0.4ML ~~LOC~~ SOLN
40.0000 mg | SUBCUTANEOUS | Status: DC
  Administered 2014-12-16 – 2014-12-17 (×2): 40 mg via SUBCUTANEOUS
  Filled 2014-12-16 (×2): qty 0.4

## 2014-12-16 MED ORDER — SODIUM CHLORIDE 0.9 % IJ SOLN: 3.0000 mL | INTRAMUSCULAR | Status: DC | PRN

## 2014-12-16 MED ORDER — VITAMIN B-1 100 MG PO TABS
100.0000 mg | ORAL_TABLET | Freq: Every day | ORAL | Status: DC
  Administered 2014-12-16 – 2014-12-18 (×3): 100 mg via ORAL
  Filled 2014-12-16 (×3): qty 1

## 2014-12-16 MED ORDER — POLYETHYLENE GLYCOL 3350 17 G PO PACK: 17.0000 g | PACK | Freq: Every day | ORAL | Status: DC | PRN

## 2014-12-16 MED ORDER — MONTELUKAST SODIUM 10 MG PO TABS
10.0000 mg | ORAL_TABLET | Freq: Every day | ORAL | Status: DC
  Administered 2014-12-16 – 2014-12-17 (×2): 10 mg via ORAL
  Filled 2014-12-16 (×2): qty 1

## 2014-12-16 MED ORDER — ALBUTEROL SULFATE (2.5 MG/3ML) 0.083% IN NEBU: 2.5000 mg | INHALATION_SOLUTION | RESPIRATORY_TRACT | Status: DC | PRN

## 2014-12-16 MED ORDER — ALBUTEROL SULFATE (2.5 MG/3ML) 0.083% IN NEBU
2.5000 mg | INHALATION_SOLUTION | Freq: Once | RESPIRATORY_TRACT | Status: AC
  Administered 2014-12-16: 2.5 mg via RESPIRATORY_TRACT

## 2014-12-16 MED ORDER — IPRATROPIUM-ALBUTEROL 0.5-2.5 (3) MG/3ML IN SOLN
3.0000 mL | RESPIRATORY_TRACT | Status: DC
  Administered 2014-12-16 – 2014-12-18 (×10): 3 mL via RESPIRATORY_TRACT
  Filled 2014-12-16 (×10): qty 3

## 2014-12-16 MED ORDER — LISINOPRIL 5 MG PO TABS
5.0000 mg | ORAL_TABLET | Freq: Every day | ORAL | Status: DC
  Administered 2014-12-16 – 2014-12-18 (×3): 5 mg via ORAL
  Filled 2014-12-16 (×3): qty 1

## 2014-12-16 MED ORDER — LORAZEPAM 2 MG/ML IJ SOLN: 1.0000 mg | Freq: Four times a day (QID) | INTRAMUSCULAR | Status: DC | PRN

## 2014-12-16 MED ORDER — FOLIC ACID 1 MG PO TABS
1.0000 mg | ORAL_TABLET | Freq: Every day | ORAL | Status: DC
  Administered 2014-12-16 – 2014-12-18 (×3): 1 mg via ORAL
  Filled 2014-12-16 (×3): qty 1

## 2014-12-16 MED ORDER — PREDNISONE 50 MG PO TABS
50.0000 mg | ORAL_TABLET | Freq: Every day | ORAL | Status: DC
  Administered 2014-12-16 – 2014-12-18 (×3): 50 mg via ORAL
  Filled 2014-12-16 (×3): qty 1

## 2014-12-16 MED ORDER — DULOXETINE HCL 30 MG PO CPEP
30.0000 mg | ORAL_CAPSULE | Freq: Every day | ORAL | Status: DC
  Administered 2014-12-16 – 2014-12-18 (×3): 30 mg via ORAL
  Filled 2014-12-16 (×3): qty 1

## 2014-12-16 MED ORDER — ATORVASTATIN CALCIUM 40 MG PO TABS
40.0000 mg | ORAL_TABLET | Freq: Every day | ORAL | Status: DC
Start: 2014-12-16 — End: 2014-12-18
  Administered 2014-12-16 – 2014-12-17 (×2): 40 mg via ORAL
  Filled 2014-12-16 (×2): qty 1

## 2014-12-16 MED ORDER — NICOTINE 21 MG/24HR TD PT24
21.0000 mg | MEDICATED_PATCH | Freq: Every day | TRANSDERMAL | Status: DC
  Administered 2014-12-16 – 2014-12-18 (×3): 21 mg via TRANSDERMAL
  Filled 2014-12-16 (×3): qty 1

## 2014-12-16 MED ORDER — ASPIRIN EC 81 MG PO TBEC
81.0000 mg | DELAYED_RELEASE_TABLET | Freq: Every day | ORAL | Status: DC
  Administered 2014-12-16 – 2014-12-18 (×3): 81 mg via ORAL
  Filled 2014-12-16 (×3): qty 1

## 2014-12-16 NOTE — H&P (Signed)
Family Medicine [facility] Admission History and Physical Service Pager: [phone]  Patient name: Juan [name] Medical record number: [id number] Date of birth: [birth year] Age: 64 y.o. Gender: male  Primary Care Provider: Renold Don, MD Consultants: none Code Status: FULL  Chief Complaint: Dyspnea  Assessment and Plan: Juan [name] is a 64 y.o. male presenting with dyspnea at rest. PMH is significant for Afib (not on anticoagulation due to falls), COPD, HFrEF (EF 50-55% in most recent , hepatomegaly, Anxiety, HTN, tobacco use, RBBB,   Dyspnea: Onset after restarting Carvedilol and requiring new O2 requirement with bilateral wheezing on lung exam. No increased cough or sputum production. Possible this is COPD exacerbation due to restarting of nonselective beta blocker. Also considering PE supported with tachycardia, new O2 requirement, unilateral lower extremity swelling and recent history of prolonged immobility. ACS considered, however no chest pain. Pneumonia less likely as no fevers/chills, cough/sputum production, and no crackles on lung exam. Consider CHF exacerbation with hx of HF and some LE swelling, but no JVD. TSH 1.895 (checked 11/1) - admit to telemetry, attending Dr. Lum Babe - EKG, troponins  - CXR - Right LE dopplers - D-dimer (0.95), BNP (13.8), CBC, CMP - ABG >> unremarkable - CTA chest to rule out PE w/ positive D-dimer - supplemental O2 as needed - Duonebs q 4 hrs scheduled - Albuterol q2 PRN - Prednisone  daily (11/11>>) - considered starting Doxycycline, however exacerbation not likely due to infectious process, there is no increased cough or sputum production, and possibly due to nonselective BB  AFib: Not on anticoagulation due to risk of falls. Possibly irregular rhythm on exam, however heart sounds difficult to auscultate due to breath sounds and body habitus.  - holding home Coreg - Will restart previous home medication Cardizem  -  consider cardiology consult   Hx of Possible Syncope and Fall: Fell day prior to admission, unsure if patient hit head/or other injury; notes he "kind of passed out". Neurological exam grossly normal, alert and oriented.  - CT head ordered  HTN: stable  - plan to restart home meds 11/12: Lisinopirl   - ASA  COPD:  - duonebs q 4 hrs scheduled - holding Spiriva due to duonebs - Prednisone as above  HFrEF: Past history of EF 30-35% with akinesis of basal inferior myocardium in 2014. Most recent ECHO in 11/2014 shows EF 50-55%, moderate concentric hypertrophy, akinesis of basal inferior myocardium, left atrium mildly dilated.   - ASA  Known RBBB: - repeat EKG  Anxiety/Depression:  - cont Cymbalta - held home Klonopin, Valium  - CIWA protocol    Tobacco Use: 1.5 packs per day ( 63 pack years) - Nicotine patch   Alcohol Use: started drinking this week due to anxiety about breathing; prior to this was "dry" for 4-5 weeks.  - CIWA protocol   Grade 1 Venous Stasis Ulcers, Inner ankles bilaterally: no sign of infection  - wound care consult  Pre-DM: A1c 6.2 - heart-carb modified diet   FEN/GI: heart-carb mod diet; Miralax PRN Prophylaxis: Lovenox SQ  Disposition: admit to teaching service, attending Dr. Lum Babe.   History of Present Illness:  Juan [name] is a 64 y.o. male presenting from clinic with dyspnea.  Patient was in the hospital recently for alcoholism and was found to be in Afib with RVR. Per chart review, patient was started on Cardizem and his home Carvedilol was discontinued as well. At a recent hospital follow up appointment Cardizem was discontinued and Carvedilol was restarted  to control tachycardia. Patient notes that his symptoms began last Tuesday (11/1) after his medication was switched at the follow up appointment. He initially noticed his dyspnea with walking to the restroom. Since then he felts "like he couldn't breathe and was fighting for air". States  he has SOB occasionally which resolves with inhaler use but "but this time it didn't go away". Denies chest pain, nausea, diaphoresis, cough, hemoptysis, fevers, or chills. Notes of HA but this is chronic per patient. Notes of weakness and confusion. Patient did not notice swelling of right leg until he was seen in clinic on the day of admission; he does note of being more immobile since last Tuesday. Has a history of falls, and notes of 1 fall in the past week. He feel last night and is unsure if he injured himself or hit his head; notes he "kind of passed out". He notes of a sore spot on left frontal head. He states that his termor/shaking is mainly due to anxiety.   Notes he is an alcoholic and has be "dry" for 4-5 weeks; he however started drinking again this week because he felt anxious about his breathing. He is also a current smoker who smokes 1.5 packs per day ( 63 pack years).  Patient is compliant on medications.   Per chart review, patient's pulse ox was 88% with ambulation on room air, and only 92% while at rest. Patient received nebulizer treatments but dyspnea did not resolve. Therefore patient was admitted directly from clinic for further evaluation.    Review Of Systems: Per HPI  Otherwise the remainder of the systems were negative.  Patient Active Problem List   Diagnosis Date Noted  . Dyspnea 12/16/2014  . Alcoholism (HCC) 11/14/2014  . Atrial fibrillation with rapid ventricular response (HCC) 11/14/2014  . Sinusitis, chronic 08/05/2014  . Fall   . Weakness   . Cough   . Tachycardia 02/12/2014  . Hypokalemia 01/26/2014  . Chronic diastolic heart failure (HCC)   . Peripheral neuropathy (HCC) 12/15/2013  . Bronchiectasis without acute exacerbation (HCC) 06/30/2013  . Lung nodule seen on imaging study 06/15/2013  . Chronic systolic heart failure (HCC) 06/13/2013  . Transaminitis 06/13/2013  . RBBB 11/09/2012  . Hyponatremia 11/08/2012  . Umbilical hernia 08/06/2012  .  Smoker unmotivated to quit 07/22/2012  . Tremor 07/30/2011  . HTN (hypertension) 07/30/2011  . Hepatomegaly 11/05/2010  . Depression 11/05/2010  . Anxiety 11/05/2010    Past Medical History: Past Medical History  Diagnosis Date  . CHF (congestive heart failure) (HCC) 10/2010    ECHO:  EF 40%, Grade II diastolic dysfunction  . Alcoholism (HCC)   . Allergy     seasonal   . Anxiety   . Depression   . Asthma   . Hyperlipidemia   . Hypertension   . Seizures (HCC)   . COPD (chronic obstructive pulmonary disease) (HCC)     Past Surgical History: Past Surgical History  Procedure Laterality Date  . Toenail excision Right 1967    Removal of ingrown toenail [Other]    Social History: Social History  Substance Use Topics  . Smoking status: Current Every Day Smoker -- 1.25 packs/day for 40 years    Types: Cigarettes  . Smokeless tobacco: Never Used     Comment: a little more than 1 ppd (02/17/14)  . Alcohol Use: Yes     Comment: 1/2 galloon liquior in 2 days   Additional social history: denies ilicit drug use Please also refer  to relevant sections of EMR.  Family History: Family History  Problem Relation Age of Onset  . Alzheimer's disease Mother   . Diabetes Mother   . Heart disease Maternal Uncle     Allergies and Medications: Allergies  Allergen Reactions  . Coreg [Carvedilol] Shortness Of Breath   No current facility-administered medications on file prior to encounter.   Current Outpatient Prescriptions on File Prior to Encounter  Medication Sig Dispense Refill  . acetaminophen (TYLENOL) 325 MG tablet Take 2 tablets (650 mg total) by mouth every 6 (six) hours as needed for moderate pain. 30 tablet 0  . albuterol (PROVENTIL) (2.5 MG/3ML) 0.083% nebulizer solution USE 3 MLS BY NEBULIZATION EVERY 6 HOURS AS NEEDED FOR WHEEZING OR SHORTNESS OF BREATH 360 mL 1  . aspirin EC 81 MG EC tablet Take 1 tablet (81 mg total) by mouth daily. 90 tablet 0  . clonazePAM  (KLONOPIN) 1 MG tablet Take 1 tablet (1 mg total) by mouth 2 (two) times daily as needed for anxiety. 90 tablet 1  . atorvastatin (LIPITOR) 40 MG tablet Take 1 tablet (40 mg total) by mouth daily at 6 PM. 30 tablet 6  . COMBIVENT RESPIMAT 20-100 MCG/ACT AERS respimat INHALE TWO PUFFS BY MOUTH EVERY FOUR HOURS AS NEEDED FOR WHEEZING 1 Inhaler 3  . diazepam (VALIUM) 5 MG tablet Take 1 tab by mouth 3 times daily for 5 says, followed by 1 tab twice daily for 5 days, followed by 1 tab daily for 5 days, then stop. Quantity Sufficient for Taper. 30 tablet 0  . diltiazem (CARDIZEM CD) 180 MG 24 hr capsule Take 1 capsule (180 mg total) by mouth daily. 30 capsule 0  . DULoxetine (CYMBALTA) 30 MG capsule TAKE ONE CAPSULE BY MOUTH ONE TIME DAILY 30 capsule 3  . fluticasone (FLONASE) 50 MCG/ACT nasal spray Use two sprays in each nostril daily 16 g 11  . lisinopril (PRINIVIL,ZESTRIL) 5 MG tablet TAKE 1 TABLET BY MOUTH EVERY DAY 30 tablet 3  . Magnesium 500 MG TABS Take 500 mg by mouth daily.    . montelukast (SINGULAIR) 10 MG tablet TAKE ONE TABLET BY MOUTH NIGHTLY AT BEDTIME 30 tablet 3  . Multiple Vitamin (MULTIVITAMIN WITH MINERALS) TABS tablet Take 1 tablet by mouth daily.    Marland Kitchen tiotropium (SPIRIVA) 18 MCG inhalation capsule Place 1 capsule (18 mcg total) into inhaler and inhale daily. 30 capsule 6  . [DISCONTINUED] albuterol (PROVENTIL,VENTOLIN) 90 MCG/ACT inhaler Inhale 2 puffs into the lungs every 6 (six) hours as needed for wheezing. 17 g 0    Objective: BP 112/78 mmHg  Pulse 115  Temp(Src) 97.5 F (36.4 C) (Oral)  Resp 20  Ht  (1.727 m)  Wt 186 lb 3.2 oz (84.46 kg)  BMI 28.32 kg/m2  SpO2 93% Exam: General: Trembling/shaking, slightly anxious, some increased work of breathing but alert and answers questions appropriately  Eyes: EOMI, PERRL ENTM: MMM, pharynx normal, missing teeth Neck: supple Cardiovascular: ?irregular rhythm (difficult to auscultate due to breath sounds and body  habitus); peripheral pulses intact; no JVD  Respiratory: on 3L [place]; some increased work of breathing, no crackles; wheezing noted bilaterally.  Abdomen: soft, NT, ND, ventral/suprapubic hernia not incarcerated, ?hepatomegaly  MSK: moves extremities without difficulty; peripheral pulses intact bilaterally, severe onychomycosis of toes. Right LE: pitting edema up to upper shin, no calf tenderness, grade 1 (superficial) venous stasis ulcer in inner ankle.  Left LE: pitting edema to ankle, no calf tenderness, grade 1 (superficial) venous  stasis ulcer in inner ankle. Skin: warm and dry; grade 1 (superficial) venous stasis ulcer in inner ankles bilaterally; onychomycosis bilaterally   Neuro: neuro grossly intact  Psych: normal speech, anxious (with shaking/tremor which improves significantly if asked to do breathing exercises)   Labs and Imaging: CBC BMET  No results for input(s): WBC, HGB, HCT, PLT in the last 168 hours. No results for input(s): NA, K, CL, CO2, BUN, CREATININE, GLUCOSE, CALCIUM in the last 168 hours.    Palma Holter, MD 12/16/2014, 4:57 PM PGY-1, [place] [facility] [place] [facility] FPTS Intern pager: [phone], text pages welcome   Upper Level Addendum:  I have seen and evaluated this patient along with Dr. Ottie Glazier and reviewed the above note, making necessary revisions in red.   Kathee Delton, MD,MS,  PGY2 12/16/2014 5:52 PM

## 2014-12-16 NOTE — Progress Notes (Signed)
Subjective:    Juan [name] is a 64 y.o. male who presents to [facility] today for FU and dyspnea:  1.  FU from last visit:  We addressed several issues at his last visit. For starters we stopped his Cardizem and restart his carvedilol.  We also checked his TSH due to his ongoing tachycardia. This was normal. The rest of his labs were also normal last visit. Heart rate was 108 last visit.  2.  Dyspnea:  This is the main issue for today. Started after he saw me last week he resumed use of his Coreg. About an hour after that she began having increasing dyspnea. This has persisted through today. He is using his nebulizer multiple times a day, his wife who is present states that it basically "runs all day long". It provides him with a few minutes of relief but then he is struggling to breathe again. He has no chest pain. He has not noticed any lower extremity edema. No fevers but with questionable subjective chills at night. States he has had a tremor that started with Coreg as well.  He is not taking any diuretic.   ROS as above per HPI, otherwise neg.    The following portions of the patient's history were reviewed and updated as appropriate: allergies, current medications, past medical history, family and social history, and problem list. Patient is an everyday smoker.    PMH reviewed.  Past Medical History  Diagnosis Date  . CHF (congestive heart failure) (HCC) 10/2010    ECHO:  EF 40%, Grade II diastolic dysfunction  . Alcoholism (HCC)   . Allergy     seasonal   . Anxiety   . Depression   . Asthma   . Hyperlipidemia   . Hypertension   . Seizures (HCC)   . COPD (chronic obstructive pulmonary disease) [facility])    Past Surgical History  Procedure Laterality Date  . Toenail excision Right 1967    Removal of ingrown toenail [Other]    Medications reviewed. Current Outpatient Prescriptions  Medication Sig Dispense Refill  . acetaminophen (TYLENOL) 325 MG tablet Take 2 tablets (650 mg  total) by mouth every 6 (six) hours as needed for moderate pain. 30 tablet 0  . albuterol (PROVENTIL) (2.5 MG/3ML) 0.083% nebulizer solution USE 3 MLS BY NEBULIZATION EVERY 6 HOURS AS NEEDED FOR WHEEZING OR SHORTNESS OF BREATH 360 mL 1  . aspirin EC 81 MG EC tablet Take 1 tablet (81 mg total) by mouth daily. 90 tablet 0  . atorvastatin (LIPITOR) 40 MG tablet Take 1 tablet (40 mg total) by mouth daily at 6 PM. 30 tablet 6  . clonazePAM (KLONOPIN) 1 MG tablet Take 1 tablet (1 mg total) by mouth 2 (two) times daily as needed for anxiety. 90 tablet 1  . COMBIVENT RESPIMAT 20-100 MCG/ACT AERS respimat INHALE TWO PUFFS BY MOUTH EVERY FOUR HOURS AS NEEDED FOR WHEEZING 1 Inhaler 3  . diazepam (VALIUM) 5 MG tablet Take 1 tab by mouth 3 times daily for 5 says, followed by 1 tab twice daily for 5 days, followed by 1 tab daily for 5 days, then stop. Quantity Sufficient for Taper. 30 tablet 0  . diltiazem (CARDIZEM CD) 180 MG 24 hr capsule Take 1 capsule (180 mg total) by mouth daily. 30 capsule 0  . DULoxetine (CYMBALTA) 30 MG capsule TAKE ONE CAPSULE BY MOUTH ONE TIME DAILY 30 capsule 3  . fluticasone (FLONASE) 50 MCG/ACT nasal spray Use two sprays in each nostril  daily 16 g 11  . lisinopril (PRINIVIL,ZESTRIL) 5 MG tablet TAKE 1 TABLET BY MOUTH EVERY DAY 30 tablet 3  . Magnesium 500 MG TABS Take 500 mg by mouth daily.    . montelukast (SINGULAIR) 10 MG tablet TAKE ONE TABLET BY MOUTH NIGHTLY AT BEDTIME 30 tablet 3  . Multiple Vitamin (MULTIVITAMIN WITH MINERALS) TABS tablet Take 1 tablet by mouth daily.    Marland Kitchen tiotropium (SPIRIVA) 18 MCG inhalation capsule Place 1 capsule (18 mcg total) into inhaler and inhale daily. 30 capsule 6  . [DISCONTINUED] albuterol (PROVENTIL,VENTOLIN) 90 MCG/ACT inhaler Inhale 2 puffs into the lungs every 6 (six) hours as needed for wheezing. 17 g 0   No current facility-administered medications for this visit.     Objective:   Physical Exam BP 133/84 mmHg  Pulse 101  Temp(Src)  97.6 F (36.4 C) (Oral)  Ht 5\' 8"  (1.727 m)  Wt 186 lb 3.2 oz (84.46 kg)  BMI 28.32 kg/m2  SpO2 92% Gen:  Elderly, disheveled man sitting in chair. He has noticeable tremor in both hands. He is struggling to breathe and finish sentences. He is in moderate distress. He appears tired. HEENT: EOMI,  MMM Neck: Trachea midline Cardiac:  Tachycardic but regular rhythm. Pulm:  Some wheezing/rhonchi in upper lung fields. He has decreased to no air movement in his lower lung fields. Abd:  Soft/nondistended/nontender.  Hernia reducible  Exts: Trace edema left ankles. +3 edema right lower extremity to midshin. Some mild erythema circumferentially around ankles bilaterally Neuro:  No focal deficits.   No results found for this or any previous visit (from the past 72 hour(s)).  Impression/plan: 1. Dyspnea: -His pulse ox dropped to 88% when walking around. It was only at 92% to sitting in a chair. -Given to subsequent to it of treatments. He did start to have more air movement in his bases and his wheezing resolved. -However his dyspnea did not resolve. -There are several possible causes of this dyspnea. Possibly this is a COPD exacerbation exacerbated by reinitiation of his carvedilol. Plan to stop his beta blocker and put him back on Cardizem. May need to have cardiology weigh-in as he has history of heart failure in the past as well. - Another possibility is a PE.  This is especially worrisome as he has unilaterally swollen leg with some erythema. Checking a d-dimer. If positive will need CTA. -Possibility of CHF exacerbation. However I was not able to appreciate any crackles in his lungs. He does have one swollen extremity and has been sleeping on the couch. It is possible that this leg has been dangling while his other was elevated. He is not on his home diuretics. Checking BNP.  #2. Tremors: -Versus chills. The likelihood for pneumonia. Will check chest x-ray. -Can hold on x-ray if the d-dimer is  positive and he need CTA.  #3. Atrial fibrillation: -He is tachycardic but with a regular rhythm today. -Admit to telemetry. -He is not an anticoagulation candidate at the moment due to falls. -No evidence of stroke today. -We'll need to stop his beta blocker to see if this dyspnea improves. Start Cardizem.  #4. Electrolytes and labs: -Checking cemented and CBC today as well. -He has been taking albuterol fairly regularly and is possibly hypokalemic which would also explain distress and tremors.  5. Disposition: -In patient teaching service will see him once he arrives to the floor. -I'm hopeful this will not be a long hospital stay.  Tobey Grim, MD 12/16/2014 12:56  PM   

## 2014-12-16 NOTE — Progress Notes (Signed)
PT Cancellation Note  Patient Details Name: Juan [name] MRN: [id number] DOB: 1950/03/30   Cancelled Treatment:    Reason Eval/Treat Not Completed: Medical issues which prohibited therapy Presents with dyspnea and swollen LE. Concern for PE and d-dimer is to be ordered per MD note. Not appropriate for PT evaluation until this has been ruled out. Will hold for clearance.  Berton Mount 12/16/2014, 4:46 PM  Sunday Spill[facility] [place], [place] [phone]

## 2014-12-16 NOTE — Addendum Note (Signed)
Addended by: Lamonte Sakai, [name] on: 12/16/2014 02:02 PM   Modules accepted: Orders

## 2014-12-17 ENCOUNTER — Inpatient Hospital Stay (HOSPITAL_COMMUNITY): Payer: Managed Care, Other (non HMO)

## 2014-12-17 ENCOUNTER — Other Ambulatory Visit: Payer: Self-pay

## 2014-12-17 DIAGNOSIS — J441 Chronic obstructive pulmonary disease with (acute) exacerbation: Secondary | ICD-10-CM | POA: Insufficient documentation

## 2014-12-17 DIAGNOSIS — Z9181 History of falling: Secondary | ICD-10-CM | POA: Insufficient documentation

## 2014-12-17 DIAGNOSIS — R0600 Dyspnea, unspecified: Secondary | ICD-10-CM

## 2014-12-17 DIAGNOSIS — J189 Pneumonia, unspecified organism: Secondary | ICD-10-CM | POA: Insufficient documentation

## 2014-12-17 LAB — COMPREHENSIVE METABOLIC PANEL
ALK PHOS: 66 U/L (ref 38–126)
ALT: 24 U/L (ref 17–63)
ANION GAP: 8 (ref 5–15)
AST: 34 U/L (ref 15–41)
Albumin: 2.9 g/dL — ABNORMAL LOW (ref 3.5–5.0)
BILIRUBIN TOTAL: 0.6 mg/dL (ref 0.3–1.2)
BUN: 8 mg/dL (ref 6–20)
CALCIUM: 8.6 mg/dL — AB (ref 8.9–10.3)
CO2: 26 mmol/L (ref 22–32)
Chloride: 100 mmol/L — ABNORMAL LOW (ref 101–111)
Creatinine, Ser: 0.94 mg/dL (ref 0.61–1.24)
GFR calc Af Amer: >60 mL/min (ref >60)
GLUCOSE: 168 mg/dL — AB (ref 65–99)
POTASSIUM: 4.8 mmol/L (ref 3.5–5.1)
Sodium: 134 mmol/L — ABNORMAL LOW (ref 135–145)
TOTAL PROTEIN: 6.4 g/dL — AB (ref 6.5–8.1)

## 2014-12-17 LAB — CBC
HEMATOCRIT: 36.5 % — AB (ref 39.0–52.0)
HEMOGLOBIN: 12.1 g/dL — AB (ref 13.0–17.0)
MCH: 30.9 pg (ref 26.0–34.0)
MCHC: 33.2 g/dL (ref 30.0–36.0)
MCV: 93.4 fL (ref 78.0–100.0)
Platelets: 247 10*3/uL (ref 150–400)
RBC: 3.91 MIL/uL — ABNORMAL LOW (ref 4.22–5.81)
RDW: 14.5 % (ref 11.5–15.5)
WBC: 7 10*3/uL (ref 4.0–10.5)

## 2014-12-17 LAB — TROPONIN I: Troponin I: <0.03 ng/mL (ref <0.031)

## 2014-12-17 MED ORDER — AZITHROMYCIN 500 MG PO TABS
500.0000 mg | ORAL_TABLET | Freq: Once | ORAL | Status: AC
  Administered 2014-12-17: 500 mg via ORAL
  Filled 2014-12-17: qty 1

## 2014-12-17 MED ORDER — MUPIROCIN 2 % EX OINT
1.0000 | TOPICAL_OINTMENT | Freq: Two times a day (BID) | CUTANEOUS | Status: DC
Start: 2014-12-17 — End: 2014-12-18
  Administered 2014-12-17 – 2014-12-18 (×3): 1 via NASAL
  Filled 2014-12-17 (×2): qty 22

## 2014-12-17 MED ORDER — AZITHROMYCIN 500 MG PO TABS
250.0000 mg | ORAL_TABLET | Freq: Every day | ORAL | Status: DC
  Administered 2014-12-17: 250 mg via ORAL
  Filled 2014-12-17: qty 1

## 2014-12-17 MED ORDER — CHLORHEXIDINE GLUCONATE CLOTH 2 % EX PADS
6.0000 | MEDICATED_PAD | Freq: Every day | CUTANEOUS | Status: DC
  Administered 2014-12-17 – 2014-12-18 (×2): 6 via TOPICAL

## 2014-12-17 NOTE — Progress Notes (Signed)
Family Medicine Teaching Service Daily Progress Note Intern Pager: [phone]  Patient name: Juan [name] Medical record number: [id number] Date of birth: [birth year] Age: 64 y.o. Gender: male  Primary Care Provider: Renold Don, MD Consultants: none Code Status: full  Pt Overview and Major Events to Date:  11/11-admitted with dyspnea  Assessment and Plan: Juan [name] is a 64 y.o. male presenting with dyspnea at rest. PMH is significant for Afib (not on anticoagulation due to falls), COPD, HFrEF (EF 50-55% in most recent , hepatomegaly, Anxiety, HTN, tobacco use, RBBB,   Dyspnea: Likely 2/2 to COPD exacerbation/CAP. Onset after restarting Carvedilol and requiring new O2 requirement with bilateral wheezing on lung exam. No increased cough or sputum production. Possible this is COPD exacerbation due to restarting of nonselective beta blocker.  - troponins neg x3, EKG not done on admission, reordered today - Right LE doppler pending - D-dimer (0.95), CTA neg for PE but did show spiculated consolidations consistent with atypical pneumonia - ABG >> unremarkable - supplemental O2 as needed, currently 96% on 2L [place], wean as able - Duonebs q 4 hrs scheduled - Albuterol q2 PRN - Prednisone 50mg  daily (11/11>>) - Azithromycin 500mg , then 250mg  daily  Right leg swelling: no warmth or redness, no pain, likely venous stasis - Venous doppler to r/o DVT  AFib: Not on anticoagulation due to risk of falls. Possibly irregular rhythm on exam, however heart sounds difficult to auscultate due to breath sounds and body habitus.  - holding home Coreg - restarted previous home Cardizem  - could consider more cardioselective beta blocker for HFrEF in the future but will defer this to PCP and cardiologist   Hx of Possible Syncope and Fall: Fell day prior to admission, unsure if patient hit head/or other injury; notes he "kind of passed out". Neurological exam grossly normal, alert and oriented.   - CT head negative  HTN: Pressures 98-133/55-84 since admission - Holding Lisinopril 5mg , restart as needed   HFrEF: Past history of EF 30-35% with akinesis of basal inferior myocardium in 2014. Most recent ECHO in 11/2014 shows EF 50-55%, moderate concentric hypertrophy, akinesis of basal inferior myocardium, left atrium mildly dilated.  - ASA  Anxiety/Depression:  - cont Cymbalta - held home Klonopin, Valium  - CIWA protocol   Tobacco Use: 1.5 packs per day ( 63 pack years) - Nicotine patch   Alcohol Use: started drinking this week due to anxiety about breathing; prior to this was "dry" for 4-5 weeks.  - CIWA protocol   Grade 1 Venous Stasis Ulcers, Inner ankles bilaterally: no sign of infection  - wound care consult  FEN/GI: heart-carb mod diet; Miralax PRN Prophylaxis: Lovenox SQ  Disposition: home pending wean to room air and doppler to eval for DVT  Subjective:  Feeling much better, dyspnea minimal, no CP, cough at baseline  Objective: Temp:  [97.5 F (36.4 C)-99 F (37.2 C)] 98.5 F (36.9 C) (11/12 0815) Pulse Rate:  [96-117] 96 (11/12 0815) Resp:  [20-22] 20 (11/12 0815) BP: (98-133)/(55-84) 124/68 mmHg (11/12 0815) SpO2:  [92 %-98 %] 96 % (11/12 0823) Weight:  [182 lb 15.7 oz (83 kg)-186 lb 3.2 oz (84.46 kg)] 182 lb 15.7 oz (83 kg) (11/11 2051) Physical Exam: General: NAD Eyes: EOMI, PERRL ENTM: MMM, pharynx normal, missing teeth Neck: supple Cardiovascular: RRR, peripheral pulses intact; no JVD  Respiratory: on 3L [place]; some increased work of breathing, no crackles; wheezing noted bilaterally.  Abdomen: soft, NT, ND, ventral/suprapubic hernia not incarcerated  MSK:  moves extremities without difficulty; peripheral pulses intact bilaterally, severe onychomycosis of toes. Right LE: pitting edema up to upper shin, no calf tenderness, grade 1 (superficial) venous stasis ulcer in inner ankle. Left LE: pitting edema to ankle, no calf tenderness, grade 1  (superficial) venous stasis ulcer in inner ankle. Skin: warm and dry; grade 1 (superficial) venous stasis ulcer in inner ankles bilaterally  Neuro: neuro grossly intact  Psych: normal speech, normal mood  Laboratory:  Recent Labs Lab 12/16/14 1700 12/17/14 0443  WBC 9.7 7.0  HGB 12.7* 12.1*  HCT 38.2* 36.5*  PLT 262 247    Recent Labs Lab 12/16/14 1700 12/17/14 0443  NA 137 134*  K 3.8 4.8  CL 100* 100*  CO2 23 26  BUN 5* 8  CREATININE 0.86 0.94  CALCIUM 8.6* 8.6*  PROT 7.0 6.4*  BILITOT 0.3 0.6  ALKPHOS 62 66  ALT 26 24  AST 34 34  GLUCOSE 131* 168*   Imaging/Diagnostic Tests: CXR: COPD/emphysema. No acute cardiopulmonary disease  CTA chest:  1. No evidence of central pulmonary embolus. 2. Patchy peribronchovascular airspace opacities within both lungs, particularly within the upper lobes bilaterally, somewhat spiculated and nodular at several locations within the left upper lobe. As these are entirely new from March, and given multiple opacities within both upper lobes, these are thought to reflect atypical pneumonia. 3. Mild underlying emphysematous change bilaterally, more prominent at the right upper lobe. Mild bronchiectasis within the right upper lobe. 4. Diffuse coronary artery calcifications seen.  Abram Sander, MD 12/17/2014, 9:07 AM PGY-3, [facility] FPTS Intern pager: [phone], text pages welcome

## 2014-12-17 NOTE — Evaluation (Signed)
Physical Therapy Evaluation and Discharge Patient Details Name: Juan [name] MRN: [id number] DOB: [birth year] Today's Date: 12/17/2014   History of Present Illness  Juan [name] is a 64 y.o. male presenting with dyspnea, likely 2/2 COPD exacerbation/CAP. PMH is significant for Afib (not on anticoagulation due to falls), COPD, HFrEF, hepatomegaly, Anxiety, HTN, tobacco use, and RBBB.  Clinical Impression  Patient evaluated by Physical Therapy with no further acute PT needs identified. All education has been completed and the patient has no further questions. Pt mobilizing at a modified independent level without physical assist. Tolerate higher level of dynamic gait challenges. Reports falling at home when he drinks too much alcohol, although one instance of LEs buckling Thursday PTA, but denies dizziness or syncopal symptoms. Encouraged to use RW for safety and follow-up with OPPT. He is deconditioned and rarely leaves the house. Would benefit from OPPT at d/c and he agrees. SpO2 98% on 1L at rest, 92% while ambulating on room air with 3/4 dyspnea. See below for any follow-up Physical Therapy or equipment needs. PT is signing off. Thank you for this referral.     Follow Up Recommendations Outpatient PT;Supervision - Intermittent    Equipment Recommendations  None recommended by PT    Recommendations for Other Services       Precautions / Restrictions Precautions Precautions: Fall Restrictions Weight Bearing Restrictions: No      Mobility  Bed Mobility Overal bed mobility: Modified Independent             General bed mobility comments: extra time  Transfers Overall transfer level: Modified independent               General transfer comment: Minimal sway. Extra time. No assist  Ambulation/Gait Ambulation/Gait assistance: Modified independent (Device/Increase time) Ambulation Distance (Feet): 115 Feet Assistive device: None Gait Pattern/deviations:  Step-through pattern;Drifts right/left Gait velocity: decreased Gait velocity interpretation: Below normal speed for age/gender General Gait Details: Demonstrates minimal deviation from straight path with dynamic gait challenges including high marching, quick turns, backwards stepping, vertical/horizontal head turns, and variable speeds. No overt loss of balance noted. Did not require physical assist. 3/4 dyspnea at end of ambulatory bout SpO2 92% on room air, HR 110.  Stairs            Wheelchair Mobility    Modified Rankin (Stroke Patients Only)       Balance Overall balance assessment: No apparent balance deficits (not formally assessed);History of Falls                                           Pertinent Vitals/Pain Pain Assessment: No/denies pain    Home Living Family/patient expects to be discharged to:: Private residence Living Arrangements: Spouse/significant other Available Help at Discharge: Family;Available 24 hours/day Type of Home: House Home Access: Level entry     Home Layout: Two level;Able to live on main level with bedroom/bathroom;Laundry or work area in [employer] Equipment: Environmental consultant - 2 wheels;Shower seat;Grab bars - tub/shower;Cane - single point      Prior Function Level of Independence: Independent with assistive device(s)         Comments: "walking stick" to ambulate     Hand Dominance   Dominant Hand: Right    Extremity/Trunk Assessment   Upper Extremity Assessment: Defer to OT evaluation           Lower Extremity  Assessment: Generalized weakness         Communication   Communication: No difficulties  Cognition Arousal/Alertness: Awake/alert Behavior During Therapy: WFL for tasks assessed/performed Overall Cognitive Status: Within Functional Limits for tasks assessed                      General Comments General comments (skin integrity, edema, etc.): States he falls when he drinks too much.  Does report LEs buckled thursday when he began feeling unwell, just prior to admission. Denied any dizziness or syncopal symptoms. We discussed dangers of falls due to alcohol consumption and need for RW until pt feels well again. verbalizes understanding and reports he would like to exercise more to improve his health and independence with functional mobility. SpO2 98% on 1L supplemental O2 at rest. 92% with ambulation on room air.    Exercises        Assessment/Plan    PT Assessment Patent does not need any further PT services  PT Diagnosis Abnormality of gait;Generalized weakness   PT Problem List    PT Treatment Interventions     PT Goals (Current goals can be found in the Care Plan section) Acute Rehab PT Goals Patient Stated Goal: Get healthy PT Goal Formulation: All assessment and education complete, DC therapy    Frequency     Barriers to discharge        Co-evaluation               End of Session   Activity Tolerance: Patient tolerated treatment well Patient left: in chair;with call bell/phone within reach Nurse Communication: Mobility status         Time: 8295-6213 PT Time Calculation (min) (ACUTE ONLY): 17 min   Charges:   PT Evaluation $Initial PT Evaluation Tier I: 1 Procedure     PT G CodesBerton Mount 12/17/2014, 3:18 PM Sunday Spill[facility] [place], [place] [phone]

## 2014-12-17 NOTE — Progress Notes (Signed)
VASCULAR LAB PRELIMINARY  PRELIMINARY  PRELIMINARY  PRELIMINARY  Right lower extremity venous duplex completed.    Preliminary report:  There is no DVT or SVT noted in the right lower extremity.  Interstitial fluid noted in the right calf.   Daden Mahany, RVT 12/17/2014, 3:16 PM

## 2014-12-18 ENCOUNTER — Other Ambulatory Visit: Payer: Managed Care, Other (non HMO)

## 2014-12-18 MED ORDER — AZITHROMYCIN 250 MG PO TABS
250.0000 mg | ORAL_TABLET | Freq: Every day | ORAL | Status: DC
Start: 2014-12-18 — End: 2015-01-01

## 2014-12-18 MED ORDER — DILTIAZEM HCL ER COATED BEADS 180 MG PO CP24: 180.0000 mg | ORAL_CAPSULE | Freq: Every day | ORAL | Status: DC

## 2014-12-18 MED ORDER — PREDNISONE 50 MG PO TABS: 50.0000 mg | ORAL_TABLET | Freq: Every day | ORAL | Status: DC

## 2014-12-18 NOTE — Discharge Summary (Signed)
Family Medicine Teaching [facility] Discharge Summary  Patient name: Juan [name] Medical record number: [id number] Date of birth: 08-22-1950 Age: 64 y.o. Gender: male Date of Admission: 12/16/2014  Date of Discharge: 12/18/2014  Admitting Physician: Doreene Eland, MD  Primary Care Provider: Renold Don, MD Consultants: None  Indication for Hospitalization: Shortness of breath  Discharge Diagnoses/Problem List:  COPD exacerbation Possible atypical pneumonia Beta blocker-induced bronchospasm Chronic systolic heart failure (improved EF to 55%)  Disposition: Discharged to home  Discharge Condition: Stable, improved  Discharge Exam:  Gen: Chronically ill-appearing man in no distress CV: Regular rate, no murmur, no JVD, symmetric non-tender pitting edema to bilateral ankles Pulm: Non-labored breathing ambient air with good air movement and slightly prolonged expiration and scattered end-expiratory wheezes.   Brief Hospital Course:  Juan [name] is a 64 y.o. male admitted from clinic due to hypoxemia and dyspnea. Medical history is significant for Afib (not on anticoagulation due to falls), RBBB, COPD, tobacco use, HFrEF (EF 50-55% in most recent echo), HTN, and anxiety.  He presented to the [facility] for dyspnea at rest shortly following first dose of carvedilol and was found to have SpO2 of 92% at rest, 88% with ambulation and wheezing responsive to breathing treatment suggestive of bronchospasm related to use of nonspecific betablocker. PE was also a consideration with asymmetric LE edema and positive D-dimer. RLE doppler showed no DVT and CTA chest ruled out significant PE but showed spiculated opacities suggestive of atypical pneumonia. BNP was 13.8. Supplemental oxygen, bronchodilators, prednisone and azithromycin were started 11/11. Over the next 48 hours dyspnea resolved and oxygen was weaned to room air. He will be discharged with prescriptions to complete the remaining 5  day courses of azithromycin and prednisone. He will follow up with PCP 11/15 at 10:00am or call the clinic or seek medical care if symptoms return.  He was placed back on diltiazem with good rate and rhythm control. This was prescribed for continued use given the return of normal EF on Oct 2016 echocardiogram. He was continued on aspirin, as frequent falls prevents use of anticoagulation. Lisinopril  was continued with goal-range BPs throughout hospitalization.   Issues for Follow Up:  1. Consider trial of cardioselective BB (e.g. metoprolol succinate, bisoprolol) for AFib w/history of systolic dysfunction (EF now 55-60%). 2. Ongoing tobacco cessation counseling.  Significant Procedures: None  Significant Labs and Imaging:   Recent Labs Lab 12/16/14 1700 12/17/14 0443  WBC 9.7 7.0  HGB 12.7* 12.1*  HCT 38.2* 36.5*  PLT 262 247    Recent Labs Lab 12/16/14 1700 12/17/14 0443  NA 137 134*  K 3.8 4.8  CL 100* 100*  CO2 23 26  GLUCOSE 131* 168*  BUN 5* 8  CREATININE 0.86 0.94  CALCIUM 8.6* 8.6*  ALKPHOS 62 66  AST 34 34  ALT 26 24  ALBUMIN 3.0* 2.9*   CXR: COPD/emphysema. No acute cardiopulmonary disease  CTA chest:  1. No evidence of central pulmonary embolus. 2. Patchy peribronchovascular airspace opacities within both lungs, particularly within the upper lobes bilaterally, somewhat spiculated and nodular at several locations within the left upper lobe. As these are entirely new from March, and given multiple opacities within both upper lobes, these are thought to reflect atypical pneumonia. 3. Mild underlying emphysematous change bilaterally, more prominent at the right upper lobe. Mild bronchiectasis within the right upper lobe. 4. Diffuse coronary artery calcifications seen.  Results/Tests Pending at Time of Discharge: None  Discharge Medications:    Medication List  TAKE these medications        acetaminophen 325 MG tablet  Commonly known as:  TYLENOL   Take 2 tablets (650 mg total) by mouth every 6 (six) hours as needed for moderate pain.     albuterol (2.5 MG/3ML) 0.083% nebulizer solution  Commonly known as:  PROVENTIL  USE 3 MLS BY NEBULIZATION EVERY 6 HOURS AS NEEDED FOR WHEEZING OR SHORTNESS OF BREATH     aspirin 81 MG EC tablet  Take 1 tablet (81 mg total) by mouth daily.     atorvastatin 40 MG tablet  Commonly known as:  LIPITOR  Take 1 tablet (40 mg total) by mouth daily at 6 PM.     azithromycin 250 MG tablet  Commonly known as:  ZITHROMAX  Take 1 tablet (250 mg total) by mouth daily.     cholecalciferol 1000 UNITS tablet  Commonly known as:  VITAMIN D  Take 1,000 Units by mouth daily.     clonazePAM 1 MG tablet  Commonly known as:  KLONOPIN  Take 1 tablet (1 mg total) by mouth 2 (two) times daily as needed for anxiety.     COMBIVENT RESPIMAT 20-100 MCG/ACT Aers respimat  Generic drug:  Ipratropium-Albuterol  INHALE TWO PUFFS BY MOUTH EVERY FOUR HOURS AS NEEDED FOR WHEEZING     diltiazem 180 MG 24 hr capsule  Commonly known as:  CARDIZEM CD  Take 1 capsule (180 mg total) by mouth daily.     DULoxetine 30 MG capsule  Commonly known as:  CYMBALTA  TAKE ONE CAPSULE BY MOUTH ONE TIME DAILY     fluticasone 50 MCG/ACT nasal spray  Commonly known as:  FLONASE  Use two sprays in each nostril daily     lisinopril 5 MG tablet  Commonly known as:  PRINIVIL,ZESTRIL  TAKE 1 TABLET BY MOUTH EVERY DAY     Magnesium 500 MG Tabs  Take 500 mg by mouth daily.     montelukast 10 MG tablet  Commonly known as:  SINGULAIR  TAKE ONE TABLET BY MOUTH NIGHTLY AT BEDTIME     multivitamin with minerals Tabs tablet  Take 1 tablet by mouth daily.     predniSONE 50 MG tablet  Commonly known as:  DELTASONE  Take 1 tablet (50 mg total) by mouth daily with breakfast.     tiotropium 18 MCG inhalation capsule  Commonly known as:  SPIRIVA  Place 1 capsule (18 mcg total) into inhaler and inhale daily.       Discharge  Instructions: Please refer to Patient Instructions section of EMR for full details.  Patient was counseled important signs and symptoms that should prompt return to medical care, changes in medications, dietary instructions, activity restrictions, and follow up appointments.   Follow-Up Appointments: Follow-up Information    Follow up with [facility], MD On 12/20/2014.   Specialty:  Family Medicine   Why:  10:00am   Contact information:   [address] [place] [address] [phone]      Tyrone Nine, MD 12/18/2014, 10:41 AM PGY-3, [facility]

## 2014-12-18 NOTE — Discharge Instructions (Signed)
You were admitted due to shortness of breath thought to be due to either atypical pneumonia or starting carvedilol. This has improved and you are stable for discharge. You should:  - STOP taking carvedilol - START taking diltiazem (start 11/14). - KEEP taking prednisone in the mornings for the next 3 days (start 11/14).  - KEEP taking azithromycin daily for the next 3 days (take this today, 11/13, if you did not receive it in the hospital today).  Prescriptions for azithromycin, prednisone and diltiazem have been sent to your pharmacy. Be sure to pick these up today.   You have a follow up scheduled this week, make sure you attend this appointment.   If you experience worsening trouble breathing, chest pain, or weakness please call the clinic at [phone] or go to the ER.   We hope you feel better soon,  - Family Medicine Teaching Service

## 2014-12-18 NOTE — Progress Notes (Signed)
Patient's discharged papers printed,explained and given to patient.Questions were answered satisfactorily at the time of discharged.No complaint,no open skin issue and medically stable at the time of discharged.

## 2014-12-18 NOTE — Progress Notes (Signed)
Patient being discharged today. CM met with patient regarding PT recommendations for OP PT, patient declined at this time. Patient PCP H[facility] Rao [place] appointment scheduled 11/15 at 10am.  Patient denies any difficulties obtaining medications. Patient states, spouse will transport him home via private vehicle. No further CM needs identified.

## 2014-12-20 ENCOUNTER — Encounter: Payer: Self-pay | Admitting: Family Medicine

## 2014-12-20 ENCOUNTER — Ambulatory Visit (INDEPENDENT_AMBULATORY_CARE_PROVIDER_SITE_OTHER): Payer: Managed Care, Other (non HMO) | Admitting: Family Medicine

## 2014-12-20 VITALS — BP 122/70 | HR 85 | Temp 98.2°F | Ht 68.0 in | Wt 191.0 lb

## 2014-12-20 DIAGNOSIS — I4891 Unspecified atrial fibrillation: Secondary | ICD-10-CM | POA: Diagnosis not present

## 2014-12-20 DIAGNOSIS — R531 Weakness: Secondary | ICD-10-CM

## 2014-12-20 DIAGNOSIS — E871 Hypo-osmolality and hyponatremia: Secondary | ICD-10-CM | POA: Diagnosis not present

## 2014-12-20 DIAGNOSIS — J441 Chronic obstructive pulmonary disease with (acute) exacerbation: Secondary | ICD-10-CM | POA: Diagnosis not present

## 2014-12-20 DIAGNOSIS — F1020 Alcohol dependence, uncomplicated: Secondary | ICD-10-CM | POA: Diagnosis not present

## 2014-12-20 LAB — COMPREHENSIVE METABOLIC PANEL
ALT: 40 U/L (ref 9–46)
AST: 41 U/L — ABNORMAL HIGH (ref 10–35)
Albumin: 3.6 g/dL (ref 3.6–5.1)
Alkaline Phosphatase: 66 U/L (ref 40–115)
BUN: 8 mg/dL (ref 7–25)
CALCIUM: 8.6 mg/dL (ref 8.6–10.3)
CHLORIDE: 93 mmol/L — AB (ref 98–110)
CO2: 23 mmol/L (ref 20–31)
Creat: 0.72 mg/dL (ref 0.70–1.25)
GLUCOSE: 108 mg/dL — AB (ref 65–99)
POTASSIUM: 3.8 mmol/L (ref 3.5–5.3)
Sodium: 130 mmol/L — ABNORMAL LOW (ref 135–146)
Total Bilirubin: 0.3 mg/dL (ref 0.2–1.2)
Total Protein: 6.5 g/dL (ref 6.1–8.1)

## 2014-12-20 LAB — CBC
HEMATOCRIT: 40.3 % (ref 39.0–52.0)
HEMOGLOBIN: 13.8 g/dL (ref 13.0–17.0)
MCH: 31.2 pg (ref 26.0–34.0)
MCHC: 34.2 g/dL (ref 30.0–36.0)
MCV: 91.2 fL (ref 78.0–100.0)
MPV: 9.5 fL (ref 8.6–12.4)
Platelets: 273 10*3/uL (ref 150–400)
RBC: 4.42 MIL/uL (ref 4.22–5.81)
RDW: 14.4 % (ref 11.5–15.5)
WBC: 11.8 10*3/uL — AB (ref 4.0–10.5)

## 2014-12-20 NOTE — Progress Notes (Signed)
Subjective:    Juan [name] is a 64 y.o. male who presents to [facility] today for hospital FU:  1.  Hospital FU:  Dyspnea was reason for hospitalization.  This has improved.  Still with some mild dyspnea on prolonged exertion, but much better.  No chest pain.  No palpations.  Cough present but better.  Does feel very weak.   2.  Fatigue:  Persists.  Eating has decreased over past several months.  Wife unsure what to do about this.  Unforunately he has started drinking again.  Bourbon most nights, several glasses.  No falls.  No seizures.     ROS as above per HPI, otherwise neg.    The following portions of the patient's history were reviewed and updated as appropriate: allergies, current medications, past medical history, family and social history, and problem list. Patient is an everyday smoker.    PMH reviewed.  Past Medical History  Diagnosis Date  . CHF (congestive heart failure) (HCC) 10/2010    ECHO:  EF 40%, Grade II diastolic dysfunction  . Alcoholism (HCC)   . Allergy     seasonal   . Anxiety   . Depression   . Asthma   . Hyperlipidemia   . Hypertension   . Seizures (HCC)   . COPD (chronic obstructive pulmonary disease) (HCC)   . Headache    Past Surgical History  Procedure Laterality Date  . Toenail excision Right 1967    Removal of ingrown toenail [Other]    Medications reviewed. Current Outpatient Prescriptions  Medication Sig Dispense Refill  . acetaminophen (TYLENOL) 325 MG tablet Take 2 tablets (650 mg total) by mouth every 6 (six) hours as needed for moderate pain. 30 tablet 0  . albuterol (PROVENTIL) (2.5 MG/3ML) 0.083% nebulizer solution USE 3 MLS BY NEBULIZATION EVERY 6 HOURS AS NEEDED FOR WHEEZING OR SHORTNESS OF BREATH 360 mL 1  . aspirin EC 81 MG EC tablet Take 1 tablet (81 mg total) by mouth daily. 90 tablet 0  . atorvastatin (LIPITOR) 40 MG tablet Take 1 tablet (40 mg total) by mouth daily at 6 PM. 30 tablet 6  . azithromycin (ZITHROMAX) 250 MG tablet  Take 1 tablet (250 mg total) by mouth daily. 3 tablet 0  . cholecalciferol (VITAMIN D) 1000 UNITS tablet Take 1,000 Units by mouth daily.    . clonazePAM (KLONOPIN) 1 MG tablet Take 1 tablet (1 mg total) by mouth 2 (two) times daily as needed for anxiety. 90 tablet 1  . COMBIVENT RESPIMAT 20-100 MCG/ACT AERS respimat INHALE TWO PUFFS BY MOUTH EVERY FOUR HOURS AS NEEDED FOR WHEEZING 1 Inhaler 3  . diltiazem (CARDIZEM CD) 180 MG 24 hr capsule Take 1 capsule (180 mg total) by mouth daily. 30 capsule 0  . DULoxetine (CYMBALTA) 30 MG capsule TAKE ONE CAPSULE BY MOUTH ONE TIME DAILY 30 capsule 3  . fluticasone (FLONASE) 50 MCG/ACT nasal spray Use two sprays in each nostril daily 16 g 11  . lisinopril (PRINIVIL,ZESTRIL) 5 MG tablet TAKE 1 TABLET BY MOUTH EVERY DAY 30 tablet 3  . Magnesium 500 MG TABS Take 500 mg by mouth daily.    . montelukast (SINGULAIR) 10 MG tablet TAKE ONE TABLET BY MOUTH NIGHTLY AT BEDTIME 30 tablet 3  . Multiple Vitamin (MULTIVITAMIN WITH MINERALS) TABS tablet Take 1 tablet by mouth daily.    . predniSONE (DELTASONE) 50 MG tablet Take 1 tablet (50 mg total) by mouth daily with breakfast. 3 tablet 0  . tiotropium (  SPIRIVA) 18 MCG inhalation capsule Place 1 capsule (18 mcg total) into inhaler and inhale daily. 30 capsule 6  . [DISCONTINUED] albuterol (PROVENTIL,VENTOLIN) 90 MCG/ACT inhaler Inhale 2 puffs into the lungs every 6 (six) hours as needed for wheezing. 17 g 0   No current facility-administered medications for this visit.     Objective:   Physical Exam BP 122/70 mmHg  Pulse 85  Temp(Src) 98.2 F (36.8 C) (Oral)  Ht  (1.727 m)  Wt 191 lb (86.637 kg)  BMI 29.05 kg/m2 Gen:  Alert, cooperative patient who appears stated age in no acute distress.  Vital signs reviewed. HEENT: EOMI,  MMM Cardiac:  Regular rate and rhythm  Pulm:  Some wheezing at bases.  Ext: minimal trace edema at ankles. Neuro:  Tremulous  No results found for this or any previous visit  (from the past 72 hour(s)).

## 2014-12-23 ENCOUNTER — Telehealth: Payer: Self-pay | Admitting: Family Medicine

## 2014-12-23 NOTE — Assessment & Plan Note (Signed)
Much improved.   Fall risk due to EtOHism.  No anticoagulation

## 2014-12-23 NOTE — Telephone Encounter (Signed)
Returned call. Spoke with Juan [name] the wife.  Area around eye is red.  Eye itself has some drainage and mild redness.  No pain.  No itchiness.  Plan ointment to help treat or at least provide lubrication.  However, discussed that if eye pain worsens, redness around eye worsens, he has any change in vision, he should be seen immediately.  She expressed understanding.   Regarding his drinking -- he continues to drink.  There's not much we can do about this.  Recommended to continue the klonopin as she tapers down his drinking. If he goes through any signs of DT's or withdrawal encouraged to take to ED, despite his wishes against this.  She expressed understanding.

## 2014-12-23 NOTE — Telephone Encounter (Signed)
Forwarding to PCP. Lamonte Sakai, Fate Galanti D, [place]

## 2014-12-23 NOTE — Assessment & Plan Note (Signed)
Multifactorial from deconditioning, lack of physical activity, recent hospitalization, and continued EtOHism.   Checking sodium today.

## 2014-12-23 NOTE — Assessment & Plan Note (Signed)
Improved. Continue full course of prednisone and abx.   FU in 2 weeks for recheck.   Pulse ox remained normal on ambulation in clinic today.

## 2014-12-23 NOTE — Assessment & Plan Note (Signed)
Persists.   Contributes to fatigue. Used motivational interviewing to gauge how much he though alcoholism was contributing to his symptoms.  There still seems to be a large disconnect there.   Feels his PNA is reason for his ongoing fatigue and that he will be better once that is completely resolved.

## 2014-12-23 NOTE — Telephone Encounter (Signed)
pts wife is requesting an rx for patient's eye. Patient was recently in the hospital and was treated for pink eye while there, today he woke up and one of his eyes was almost swollen shut. Pts wife also asking to speak with Dr. Gwendolyn Grant for guidance on what to do about patient's drinking. Says he is drinking heavily again and has run out and says she doesn't know whether to go get more for him or he end up sick and in the hospital again.

## 2014-12-23 NOTE — Patient Instructions (Signed)
Follow-up with me in a week.  Sooner if you are not doing any better.   I have slots available this Friday.

## 2014-12-28 ENCOUNTER — Inpatient Hospital Stay (HOSPITAL_COMMUNITY)
Admission: EM | Admit: 2014-12-28 | Discharge: 2015-01-01 | DRG: 897 | Disposition: A | Discharge status: Home / Self Care | Payer: Managed Care, Other (non HMO) | Attending: Family Medicine | Admitting: Family Medicine

## 2014-12-28 ENCOUNTER — Encounter (HOSPITAL_COMMUNITY): Payer: Self-pay | Admitting: *Deleted

## 2014-12-28 ENCOUNTER — Telehealth: Payer: Self-pay | Admitting: *Deleted

## 2014-12-28 ENCOUNTER — Emergency Department (HOSPITAL_COMMUNITY): Payer: Managed Care, Other (non HMO)

## 2014-12-28 DIAGNOSIS — E872 Acidosis, unspecified: Secondary | ICD-10-CM | POA: Diagnosis present

## 2014-12-28 DIAGNOSIS — F10239 Alcohol dependence with withdrawal, unspecified: Secondary | ICD-10-CM | POA: Diagnosis not present

## 2014-12-28 DIAGNOSIS — I10 Essential (primary) hypertension: Secondary | ICD-10-CM | POA: Diagnosis present

## 2014-12-28 DIAGNOSIS — E871 Hypo-osmolality and hyponatremia: Secondary | ICD-10-CM | POA: Diagnosis present

## 2014-12-28 DIAGNOSIS — F17210 Nicotine dependence, cigarettes, uncomplicated: Secondary | ICD-10-CM | POA: Diagnosis present

## 2014-12-28 DIAGNOSIS — E8729 Other acidosis: Secondary | ICD-10-CM | POA: Diagnosis present

## 2014-12-28 DIAGNOSIS — Z7982 Long term (current) use of aspirin: Secondary | ICD-10-CM

## 2014-12-28 DIAGNOSIS — J479 Bronchiectasis, uncomplicated: Secondary | ICD-10-CM

## 2014-12-28 DIAGNOSIS — J449 Chronic obstructive pulmonary disease, unspecified: Secondary | ICD-10-CM | POA: Diagnosis present

## 2014-12-28 DIAGNOSIS — I482 Chronic atrial fibrillation: Secondary | ICD-10-CM | POA: Diagnosis present

## 2014-12-28 DIAGNOSIS — F329 Major depressive disorder, single episode, unspecified: Secondary | ICD-10-CM | POA: Diagnosis present

## 2014-12-28 DIAGNOSIS — R000 Tachycardia, unspecified: Secondary | ICD-10-CM | POA: Diagnosis present

## 2014-12-28 DIAGNOSIS — Z9181 History of falling: Secondary | ICD-10-CM

## 2014-12-28 DIAGNOSIS — F1020 Alcohol dependence, uncomplicated: Secondary | ICD-10-CM | POA: Diagnosis present

## 2014-12-28 DIAGNOSIS — R531 Weakness: Secondary | ICD-10-CM

## 2014-12-28 DIAGNOSIS — F419 Anxiety disorder, unspecified: Secondary | ICD-10-CM | POA: Diagnosis present

## 2014-12-28 DIAGNOSIS — Y908 Blood alcohol level of 240 mg/100 ml or more: Secondary | ICD-10-CM | POA: Diagnosis present

## 2014-12-28 DIAGNOSIS — E860 Dehydration: Secondary | ICD-10-CM | POA: Diagnosis present

## 2014-12-28 DIAGNOSIS — I4510 Unspecified right bundle-branch block: Secondary | ICD-10-CM | POA: Diagnosis present

## 2014-12-28 DIAGNOSIS — E785 Hyperlipidemia, unspecified: Secondary | ICD-10-CM | POA: Diagnosis present

## 2014-12-28 LAB — BLOOD GAS, VENOUS
ACID-BASE DEFICIT: 5.1 mmol/L — AB (ref 0.0–2.0)
Bicarbonate: 17.8 mEq/L — ABNORMAL LOW (ref 20.0–24.0)
O2 SAT: 92.9 %
PATIENT TEMPERATURE: 98.6
TCO2: 15.9 mmol/L (ref 0–100)
pCO2, Ven: 28.1 mmHg — ABNORMAL LOW (ref 45.0–50.0)
pH, Ven: 7.417 — ABNORMAL HIGH (ref 7.250–7.300)
pO2, Ven: 71.4 mmHg — ABNORMAL HIGH (ref 30.0–45.0)

## 2014-12-28 LAB — BASIC METABOLIC PANEL
Anion gap: 18 — ABNORMAL HIGH (ref 5–15)
BUN: 16 mg/dL (ref 6–20)
CHLORIDE: 96 mmol/L — AB (ref 101–111)
CO2: 20 mmol/L — AB (ref 22–32)
CREATININE: 0.95 mg/dL (ref 0.61–1.24)
Calcium: 8.2 mg/dL — ABNORMAL LOW (ref 8.9–10.3)
GFR calc Af Amer: >60 mL/min (ref >60)
GFR calc non Af Amer: >60 mL/min (ref >60)
GLUCOSE: 149 mg/dL — AB (ref 65–99)
Potassium: 4.1 mmol/L (ref 3.5–5.1)
Sodium: 134 mmol/L — ABNORMAL LOW (ref 135–145)

## 2014-12-28 LAB — HEPATIC FUNCTION PANEL
ALBUMIN: 3.1 g/dL — AB (ref 3.5–5.0)
ALT: 39 U/L (ref 17–63)
AST: 46 U/L — ABNORMAL HIGH (ref 15–41)
Alkaline Phosphatase: 81 U/L (ref 38–126)
BILIRUBIN DIRECT: 0.2 mg/dL (ref 0.1–0.5)
BILIRUBIN INDIRECT: 0.9 mg/dL (ref 0.3–0.9)
BILIRUBIN TOTAL: 1.1 mg/dL (ref 0.3–1.2)
Total Protein: 5.9 g/dL — ABNORMAL LOW (ref 6.5–8.1)

## 2014-12-28 LAB — I-STAT TROPONIN, ED: Troponin i, poc: 0.01 ng/mL (ref 0.00–0.08)

## 2014-12-28 LAB — CBC WITH DIFFERENTIAL/PLATELET
Basophils Absolute: 0 10*3/uL (ref 0.0–0.1)
Basophils Relative: 0 %
EOS ABS: 0.1 10*3/uL (ref 0.0–0.7)
Eosinophils Relative: 1 %
HEMATOCRIT: 41.9 % (ref 39.0–52.0)
HEMOGLOBIN: 14.7 g/dL (ref 13.0–17.0)
LYMPHS ABS: 1.5 10*3/uL (ref 0.7–4.0)
LYMPHS PCT: 16 %
MCH: 32.2 pg (ref 26.0–34.0)
MCHC: 35.1 g/dL (ref 30.0–36.0)
MCV: 91.7 fL (ref 78.0–100.0)
Monocytes Absolute: 0.6 10*3/uL (ref 0.1–1.0)
Monocytes Relative: 7 %
Neutro Abs: 7.1 10*3/uL (ref 1.7–7.7)
Neutrophils Relative %: 76 %
Platelets: 261 10*3/uL (ref 150–400)
RBC: 4.57 MIL/uL (ref 4.22–5.81)
RDW: 14.3 % (ref 11.5–15.5)
WBC: 9.4 10*3/uL (ref 4.0–10.5)

## 2014-12-28 LAB — LIPASE, BLOOD: LIPASE: 23 U/L (ref 11–51)

## 2014-12-28 LAB — SALICYLATE LEVEL: Salicylate Lvl: <4 mg/dL (ref 2.8–30.0)

## 2014-12-28 LAB — I-STAT CG4 LACTIC ACID, ED: Lactic Acid, Venous: 4.09 mmol/L (ref 0.5–2.0)

## 2014-12-28 LAB — MAGNESIUM: MAGNESIUM: 1.6 mg/dL — AB (ref 1.7–2.4)

## 2014-12-28 LAB — ETHANOL: Alcohol, Ethyl (B): 283 mg/dL — ABNORMAL HIGH (ref <5)

## 2014-12-28 LAB — ACETAMINOPHEN LEVEL

## 2014-12-28 MED ORDER — IPRATROPIUM BROMIDE 0.02 % IN SOLN
0.5000 mg | Freq: Once | RESPIRATORY_TRACT | Status: AC
  Administered 2014-12-29: 0.5 mg via RESPIRATORY_TRACT
  Filled 2014-12-28: qty 2.5

## 2014-12-28 MED ORDER — DIAZEPAM 5 MG/ML IJ SOLN
10.0000 mg | Freq: Once | INTRAMUSCULAR | Status: AC
  Administered 2014-12-29: 10 mg via INTRAVENOUS
  Filled 2014-12-28: qty 2

## 2014-12-28 MED ORDER — SODIUM CHLORIDE 0.9 % IV BOLUS (SEPSIS)
1000.0000 mL | Freq: Once | INTRAVENOUS | Status: AC
  Administered 2014-12-28: 1000 mL via INTRAVENOUS

## 2014-12-28 NOTE — ED Notes (Signed)
EKG given to EDP,Glick,MD., for review. 

## 2014-12-28 NOTE — Telephone Encounter (Signed)
Received call from wife.  She states that pt is saying that he wants to get some help, and I don't want to be like this anymore.  Wife says that this is about his drinking and is not sure where to take him.  Advised to take to Behavioral health.  Pts wife is agreeable to plan.  Advised if at any time she feels that he is a threat to himself or others around him to call 911. [place], Maryjo [place]

## 2014-12-28 NOTE — ED Notes (Signed)
Pt arrives to triage and states "I am sick"; when this RN asks pt to describe what he means by "sick" pt just states "I am sick, I am not a doctor"; pt denies N/V/D or congestion; pt states that he has pain all over; pt states that he has decreased appetite; pt denies SI / HI; pt states "Just put me away"; wife states that pt makes statements about everybody will be better off if he is dead; pt gets angry when attempt to ask him to clarify sick

## 2014-12-28 NOTE — ED Notes (Signed)
Pt. Made aware for the need of urine. 

## 2014-12-28 NOTE — ED Provider Notes (Signed)
CSN: [id number]     Arrival date & time 12/28/14  1947 History   First MD Initiated Contact with Patient 12/28/14 2007     Chief Complaint  Patient presents with  . Depression    HPI  Mr. Lizarraga is an 64 y.o. male with history of CHF (last EF 50-55% in 10/2-16), afib, alcoholism, COPD, HTN, HLD who presents to the ED with his wife. Pt states "I am sick." [name] provides most of his history. States pt was discharged from the hospital on 11/13 for COPD exacerbation and possible atypical pneumonia and since then he has been drinking a lot. She states that he has been hospitalized many times this year and every time we "detox him" and then he goes home and starts drinking again. States that this time he has been drinking approx one fifth of bourbon a day. She states that over the past year pt has become more and more weak, ill, and depressed. However since this last admission she states the pt just generally seems more mentally "not there" though she denies confusion, AMS, hallucinations. She states the reason that she brought pt to the ED today is because he told her earlier to call his PCP because he "just wanted to end it." [name] states that he seems more weak than usual but that has been progressive over the past year. States that the pt has not had anything to eat for three days. She has not seen him fall or lose consciousness. She has not witnessed any vomiting or diarrhea. She states that he takes Klonopin daily for anxiety but states that for the past week the pt has not been taking all of his regular medicines and she is not sure exactly what he has been taking.  In the ED now pt denies SI/HI. He states that he feels "sick all over." He denies any pain. Denies chest pain, abdominal pain, difficulty breathing. It is difficult to ascertain any specific complaints. He is a&ox3. He appears chronically ill. Pt's initial SpO2 in triage was 93% but during our discussion is at 95-96%. He is laying  in the bed on his side, breathing comfortably. He is cooperative with our exam though cannot further describe what he feels is wrong.   Past Medical History  Diagnosis Date  . CHF (congestive heart failure) (HCC) 10/2010    ECHO:  EF 40%, Grade II diastolic dysfunction  . Alcoholism (HCC)   . Allergy     seasonal   . Anxiety   . Depression   . Asthma   . Hyperlipidemia   . Hypertension   . Seizures (HCC)   . COPD (chronic obstructive pulmonary disease) (HCC)   . Headache    Past Surgical History  Procedure Laterality Date  . Toenail excision Right 1967    Removal of ingrown toenail [Other]   Family History  Problem Relation Age of Onset  . Alzheimer's disease Mother   . Diabetes Mother   . Heart disease Maternal Uncle    Social History  Substance Use Topics  . Smoking status: Current Every Day Smoker -- 1.50 packs/day for 40 years    Types: Cigarettes  . Smokeless tobacco: Never Used     Comment: a little more than 1 ppd (02/17/14)  . Alcohol Use: Yes     Comment: 1/2 galloon liquior in 2 days     12/28/2014 wife reports 32oz / day    Review of Systems  All other systems reviewed and  are negative.     Allergies  Coreg  Home Medications   Prior to Admission medications   Medication Sig Start Date End Date Taking? Authorizing Provider  acetaminophen (TYLENOL) 325 MG tablet Take 2 tablets (650 mg total) by mouth every 6 (six) hours as needed for moderate pain. 11/21/14  Yes Jeralyn Bennett, MD  albuterol (PROVENTIL) (2.5 MG/3ML) 0.083% nebulizer solution USE 3 MLS BY NEBULIZATION EVERY 6 HOURS AS NEEDED FOR WHEEZING OR SHORTNESS OF BREATH 10/20/14  Yes Tobey Grim, MD  aspirin EC 81 MG EC tablet Take 1 tablet (81 mg total) by mouth daily. 11/13/12  Yes Renae Fickle, MD  atorvastatin (LIPITOR) 40 MG tablet Take 1 tablet (40 mg total) by mouth daily at 6 PM. 03/17/14  Yes Chrystie Nose, MD  cholecalciferol (VITAMIN D) 1000 UNITS tablet Take 1,000 Units by  mouth daily.   Yes Historical Provider, MD  clonazePAM (KLONOPIN) 1 MG tablet Take 1 tablet (1 mg total) by mouth 2 (two) times daily as needed for anxiety. 12/06/14  Yes Tobey Grim, MD  COMBIVENT RESPIMAT 20-100 MCG/ACT AERS respimat INHALE TWO PUFFS BY MOUTH EVERY FOUR HOURS AS NEEDED FOR WHEEZING 10/14/14  Yes Tobey Grim, MD  diltiazem (CARDIZEM CD) 180 MG 24 hr capsule Take 1 capsule (180 mg total) by mouth daily. 12/18/14  Yes Tyrone Nine, MD  DULoxetine (CYMBALTA) 30 MG capsule TAKE ONE CAPSULE BY MOUTH ONE TIME DAILY 07/27/14  Yes Uvaldo Rising, MD  fluticasone [facility]) 50 MCG/ACT nasal spray Use two sprays in each nostril daily 11/08/13  Yes Tobey Grim, MD  lisinopril (PRINIVIL,ZESTRIL) 5 MG tablet TAKE 1 TABLET BY MOUTH EVERY DAY 11/30/14  Yes Tobey Grim, MD  Magnesium 500 MG TABS Take 500 mg by mouth daily.   Yes Historical Provider, MD  montelukast (SINGULAIR) 10 MG tablet TAKE ONE TABLET BY MOUTH NIGHTLY AT BEDTIME 10/17/14  Yes Tobey Grim, MD  Multiple Vitamin (MULTIVITAMIN WITH MINERALS) TABS tablet Take 1 tablet by mouth daily.   Yes Historical Provider, MD  tiotropium (SPIRIVA) 18 MCG inhalation capsule Place 1 capsule (18 mcg total) into inhaler and inhale daily. 03/16/14  Yes Tobey Grim, MD  azithromycin (ZITHROMAX) 250 MG tablet Take 1 tablet (250 mg total) by mouth daily. 12/18/14   Tyrone Nine, MD  predniSONE (DELTASONE) 50 MG tablet Take 1 tablet (50 mg total) by mouth daily with breakfast. Patient not taking: Reported on 12/28/2014 12/18/14   Tyrone Nine, MD   BP 122/73 mmHg  Pulse 124  Temp(Src) 97.8 F (36.6 C) (Oral)  Resp 18  Ht  (1.727 m)  Wt 84.823 kg  BMI 28.44 kg/m2  SpO2 93% Physical Exam  Constitutional: He is oriented to person, place, and time. No distress.  Appears chronically ill. NAD. Laying in bed wrapped up in blankets.  HENT:  Head: Normocephalic and atraumatic.  Right Ear: External ear normal.  Left Ear:  External ear normal.  Mouth/Throat: Mucous membranes are dry. No oropharyngeal exudate.  Eyes: Conjunctivae and EOM are normal. Pupils are equal, round, and reactive to light.  Left eye with mild crusting (currently being treated with ointment)  Neck: Normal range of motion. Neck supple.  Cardiovascular: Regular rhythm and normal heart sounds.  Tachycardia present.   Pulmonary/Chest: Effort normal and breath sounds normal. No respiratory distress. He exhibits no tenderness.  Bilateral expiratory wheezes  Abdominal: Soft. Bowel sounds are normal. He exhibits no distension. There  is no tenderness. There is no rebound and no guarding.  Musculoskeletal: Normal range of motion. He exhibits no edema.  Neurological: He is alert and oriented to person, place, and time. He has normal strength. A sensory deficit is present. No cranial nerve deficit. Coordination normal.  Skin: Skin is warm and dry. No rash noted. He is not diaphoretic.  Psychiatric: His speech is normal. He is withdrawn. He is not agitated, not aggressive and not actively hallucinating. Thought content is not delusional. He exhibits a depressed mood. He expresses no homicidal and no suicidal ideation. He expresses no suicidal plans and no homicidal plans.  Nursing note and vitals reviewed.   ED Course  Procedures (including critical care time) Labs Review Labs Reviewed  BASIC METABOLIC PANEL - Abnormal; Notable for the following:    Sodium 134 (*)    Chloride 96 (*)    CO2 20 (*)    Glucose, Bld 149 (*)    Calcium 8.2 (*)    Anion gap 18 (*)    All other components within normal limits  URINALYSIS, ROUTINE W REFLEX MICROSCOPIC (NOT AT [facility]) - Abnormal; Notable for the following:    Hgb urine dipstick TRACE (*)    Protein, ur 30 (*)    All other components within normal limits  URINE RAPID DRUG SCREEN, HOSP PERFORMED - Abnormal; Notable for the following:    Benzodiazepines POSITIVE (*)    All other components within normal  limits  ETHANOL - Abnormal; Notable for the following:    Alcohol, Ethyl (B) 283 (*)    All other components within normal limits  ACETAMINOPHEN LEVEL - Abnormal; Notable for the following:    Acetaminophen (Tylenol), Serum <10 (*)    All other components within normal limits  HEPATIC FUNCTION PANEL - Abnormal; Notable for the following:    Total Protein 5.9 (*)    Albumin 3.1 (*)    AST 46 (*)    All other components within normal limits  MAGNESIUM - Abnormal; Notable for the following:    Magnesium 1.6 (*)    All other components within normal limits  BLOOD GAS, VENOUS - Abnormal; Notable for the following:    pH, Ven 7.417 (*)    pCO2, Ven 28.1 (*)    pO2, Ven 71.4 (*)    Bicarbonate 17.8 (*)    Acid-base deficit 5.1 (*)    All other components within normal limits  URINE MICROSCOPIC-ADD ON - Abnormal; Notable for the following:    Squamous Epithelial / LPF 0-5 (*)    All other components within normal limits  I-STAT CG4 LACTIC ACID, ED - Abnormal; Notable for the following:    Lactic Acid, Venous 4.09 (*)    All other components within normal limits  CULTURE, BLOOD (ROUTINE X 2)  CULTURE, BLOOD (ROUTINE X 2)  URINE CULTURE  CBC WITH DIFFERENTIAL/PLATELET  SALICYLATE LEVEL  LIPASE, BLOOD  I-STAT TROPOININ, ED    Imaging Review Dg Chest 2 View  12/28/2014  CLINICAL DATA:  Chest pain EXAM: CHEST  2 VIEW COMPARISON:  Chest radiograph December 16, 2014 and chest CT December 16, 2014 FINDINGS: A 1.2 cm spiculated area in the left lung base is unchanged from recent CT examination. There is mild patchy opacity in the right upper. A mild degree of patchy opacity is also noted in the left perihilar region. Lungs elsewhere clear. Heart size and pulmonary vascularity are normal. No adenopathy. There is degenerative change in the thoracic spine. IMPRESSION: Patchy  areas of infiltrate, less apparent compared to recent studies. Somewhat spiculated area in the left base may represent  focal pneumonia but also could represent a small neoplastic focus. Given this finding, a followup chest CT in 4-6 weeks is advised to further evaluate. If this area persists after short interval follow-up, further evaluation such as nuclear medicine PET study to assess for neoplastic etiology would be advisable. No new opacity compared to recent prior studies. No change in cardiac silhouette. Electronically Signed   By: Bretta Bang III M.D.   On: 12/28/2014 21:05   I have personally reviewed and evaluated these images and lab results as part of my medical decision-making.   EKG Interpretation   Date/Time:  Wednesday December 28 2014 21:01:24 EST Ventricular Rate:  124 PR Interval:  109 QRS Duration: 128 QT Interval:  320 QTC Calculation: 460 R Axis:   -113 Text Interpretation:  Sinus tachycardia RBBB and LAFB When compared with  ECG of 12/18/2014, Left anterior fasicular block is now Present (had been  present on prior ECG's) Confirmed by [facility]  MD, DAVID ([id number]) on  12/28/2014 9:08:26 PM      MDM   Final diagnoses:  Lactic acidosis  Alcoholic ketoacidosis    Initial assessment difficult as patient denies any specific symptoms and continues to say "I feel sick all over." Started with basic lab workup for weakness including CBC, BMP, EKG, i stat troponin, UA, UDS, EtOH.   BMP showed gap of 18, CO2 20, Na 134. EtOH 284. CXR does show spiculated area of infiltrate but might be improved from prior. Will hold on abx therapy for now. EKG unchanged from prior. Trop negative. Lactic acid 4.09. Given pt's clinical picture, lactic acidosis, and likely alcoholic ketoacidosis, will consult hospitalist for admission. Pt continues to be tachycardic, starting to be tremulous and anxious. Gave valium 10mg , ativan 2mg  IV. Started on banana bag, 100mg  thiamine IV given.   Per hospitalist, pt is a family med pt. Called family medicine spoke to on-call resident Dr. Nancy Marus, they will accept pt. Will  admit/transfer pt to [facility] for family medicine admission.    Carlene Coria, PA-C 12/29/14 0106  Laurence Spates, MD 12/29/14 [phone]

## 2014-12-28 NOTE — ED Notes (Signed)
Delay in lab draw pt currently in exray

## 2014-12-28 NOTE — Telephone Encounter (Signed)
Agree with plan. Thanks for the care.

## 2014-12-29 ENCOUNTER — Encounter (HOSPITAL_COMMUNITY): Payer: Self-pay | Admitting: Emergency Medicine

## 2014-12-29 DIAGNOSIS — I10 Essential (primary) hypertension: Secondary | ICD-10-CM | POA: Diagnosis present

## 2014-12-29 DIAGNOSIS — F1020 Alcohol dependence, uncomplicated: Secondary | ICD-10-CM

## 2014-12-29 DIAGNOSIS — R000 Tachycardia, unspecified: Secondary | ICD-10-CM | POA: Diagnosis not present

## 2014-12-29 DIAGNOSIS — R531 Weakness: Secondary | ICD-10-CM | POA: Diagnosis not present

## 2014-12-29 DIAGNOSIS — J449 Chronic obstructive pulmonary disease, unspecified: Secondary | ICD-10-CM | POA: Diagnosis present

## 2014-12-29 DIAGNOSIS — E8729 Other acidosis: Secondary | ICD-10-CM | POA: Diagnosis present

## 2014-12-29 DIAGNOSIS — I4510 Unspecified right bundle-branch block: Secondary | ICD-10-CM | POA: Diagnosis present

## 2014-12-29 DIAGNOSIS — F329 Major depressive disorder, single episode, unspecified: Secondary | ICD-10-CM | POA: Diagnosis present

## 2014-12-29 DIAGNOSIS — E785 Hyperlipidemia, unspecified: Secondary | ICD-10-CM | POA: Diagnosis present

## 2014-12-29 DIAGNOSIS — E872 Acidosis, unspecified: Secondary | ICD-10-CM | POA: Diagnosis present

## 2014-12-29 DIAGNOSIS — Z7982 Long term (current) use of aspirin: Secondary | ICD-10-CM | POA: Diagnosis not present

## 2014-12-29 DIAGNOSIS — F419 Anxiety disorder, unspecified: Secondary | ICD-10-CM | POA: Diagnosis present

## 2014-12-29 DIAGNOSIS — E860 Dehydration: Secondary | ICD-10-CM | POA: Diagnosis present

## 2014-12-29 DIAGNOSIS — F17210 Nicotine dependence, cigarettes, uncomplicated: Secondary | ICD-10-CM | POA: Diagnosis present

## 2014-12-29 DIAGNOSIS — Z9181 History of falling: Secondary | ICD-10-CM | POA: Diagnosis not present

## 2014-12-29 DIAGNOSIS — I482 Chronic atrial fibrillation: Secondary | ICD-10-CM | POA: Diagnosis present

## 2014-12-29 DIAGNOSIS — Y908 Blood alcohol level of 240 mg/100 ml or more: Secondary | ICD-10-CM | POA: Diagnosis present

## 2014-12-29 DIAGNOSIS — F10239 Alcohol dependence with withdrawal, unspecified: Secondary | ICD-10-CM | POA: Diagnosis present

## 2014-12-29 LAB — RAPID URINE DRUG SCREEN, HOSP PERFORMED
AMPHETAMINES: NOT DETECTED
BARBITURATES: NOT DETECTED
Benzodiazepines: POSITIVE — AB
Cocaine: NOT DETECTED
OPIATES: NOT DETECTED
TETRAHYDROCANNABINOL: NOT DETECTED

## 2014-12-29 LAB — BASIC METABOLIC PANEL
Anion gap: 13 (ref 5–15)
Anion gap: 8 (ref 5–15)
BUN: 10 mg/dL (ref 6–20)
BUN: 10 mg/dL (ref 6–20)
CHLORIDE: 102 mmol/L (ref 101–111)
CHLORIDE: 101 mmol/L (ref 101–111)
CO2: 18 mmol/L — AB (ref 22–32)
CO2: 23 mmol/L (ref 22–32)
CREATININE: 0.93 mg/dL (ref 0.61–1.24)
Calcium: 7.4 mg/dL — ABNORMAL LOW (ref 8.9–10.3)
Calcium: 7.5 mg/dL — ABNORMAL LOW (ref 8.9–10.3)
Creatinine, Ser: 0.78 mg/dL (ref 0.61–1.24)
GFR calc Af Amer: >60 mL/min (ref >60)
GFR calc non Af Amer: >60 mL/min (ref >60)
GFR calc non Af Amer: >60 mL/min (ref >60)
GLUCOSE: 144 mg/dL — AB (ref 65–99)
Glucose, Bld: 118 mg/dL — ABNORMAL HIGH (ref 65–99)
POTASSIUM: 3.7 mmol/L (ref 3.5–5.1)
Potassium: 4.1 mmol/L (ref 3.5–5.1)
SODIUM: 132 mmol/L — AB (ref 135–145)
Sodium: 133 mmol/L — ABNORMAL LOW (ref 135–145)

## 2014-12-29 LAB — URINALYSIS, ROUTINE W REFLEX MICROSCOPIC
BILIRUBIN URINE: NEGATIVE
Glucose, UA: NEGATIVE mg/dL
Ketones, ur: NEGATIVE mg/dL
Leukocytes, UA: NEGATIVE
Nitrite: NEGATIVE
PH: 6 (ref 5.0–8.0)
Protein, ur: 30 mg/dL — AB
SPECIFIC GRAVITY, URINE: 1.013 (ref 1.005–1.030)

## 2014-12-29 LAB — PHOSPHORUS: Phosphorus: 2.9 mg/dL (ref 2.5–4.6)

## 2014-12-29 LAB — URINE MICROSCOPIC-ADD ON
Bacteria, UA: NONE SEEN
RBC / HPF: NONE SEEN RBC/hpf (ref 0–5)
WBC, UA: NONE SEEN WBC/hpf (ref 0–5)

## 2014-12-29 LAB — CBC
HEMATOCRIT: 35.8 % — AB (ref 39.0–52.0)
Hemoglobin: 12.6 g/dL — ABNORMAL LOW (ref 13.0–17.0)
MCH: 32.6 pg (ref 26.0–34.0)
MCHC: 35.2 g/dL (ref 30.0–36.0)
MCV: 92.7 fL (ref 78.0–100.0)
Platelets: 206 10*3/uL (ref 150–400)
RBC: 3.86 MIL/uL — ABNORMAL LOW (ref 4.22–5.81)
RDW: 14.2 % (ref 11.5–15.5)
WBC: 12.7 10*3/uL — AB (ref 4.0–10.5)

## 2014-12-29 LAB — LACTIC ACID, PLASMA: Lactic Acid, Venous: 1.2 mmol/L (ref 0.5–2.0)

## 2014-12-29 LAB — MAGNESIUM: Magnesium: 2 mg/dL (ref 1.7–2.4)

## 2014-12-29 MED ORDER — LORAZEPAM 1 MG PO TABS: 0.0000 mg | ORAL_TABLET | Freq: Two times a day (BID) | ORAL | Status: DC

## 2014-12-29 MED ORDER — DULOXETINE HCL 30 MG PO CPEP
30.0000 mg | ORAL_CAPSULE | Freq: Every day | ORAL | Status: DC
  Administered 2014-12-29 – 2015-01-01 (×4): 30 mg via ORAL
  Filled 2014-12-29 (×4): qty 1

## 2014-12-29 MED ORDER — LISINOPRIL 5 MG PO TABS
5.0000 mg | ORAL_TABLET | Freq: Every day | ORAL | Status: DC
  Administered 2014-12-29 – 2015-01-01 (×4): 5 mg via ORAL
  Filled 2014-12-29 (×4): qty 1

## 2014-12-29 MED ORDER — DILTIAZEM HCL ER COATED BEADS 180 MG PO CP24
180.0000 mg | ORAL_CAPSULE | Freq: Every day | ORAL | Status: DC
  Administered 2014-12-29 – 2015-01-01 (×4): 180 mg via ORAL
  Filled 2014-12-29 (×4): qty 1

## 2014-12-29 MED ORDER — LORAZEPAM 2 MG/ML IJ SOLN
2.0000 mg | Freq: Once | INTRAMUSCULAR | Status: AC
  Administered 2014-12-29: 2 mg via INTRAVENOUS

## 2014-12-29 MED ORDER — ACETAMINOPHEN 325 MG PO TABS
650.0000 mg | ORAL_TABLET | Freq: Four times a day (QID) | ORAL | Status: DC | PRN
  Administered 2014-12-31: 650 mg via ORAL
  Filled 2014-12-29: qty 2

## 2014-12-29 MED ORDER — THIAMINE HCL 100 MG/ML IJ SOLN
100.0000 mg | Freq: Once | INTRAMUSCULAR | Status: AC
  Administered 2014-12-29: 100 mg via INTRAVENOUS
  Filled 2014-12-29: qty 2

## 2014-12-29 MED ORDER — MAGNESIUM SULFATE 2 GM/50ML IV SOLN
2.0000 g | Freq: Once | INTRAVENOUS | Status: AC
  Administered 2014-12-29: 2 g via INTRAVENOUS
  Filled 2014-12-29: qty 50

## 2014-12-29 MED ORDER — SODIUM CHLORIDE 0.9 % IV SOLN
INTRAVENOUS | Status: DC
  Administered 2014-12-29 – 2014-12-31 (×4): via INTRAVENOUS

## 2014-12-29 MED ORDER — LORAZEPAM 2 MG/ML IJ SOLN
1.0000 mg | INTRAMUSCULAR | Status: DC | PRN
  Administered 2014-12-29 – 2014-12-31 (×6): 1 mg via INTRAVENOUS
  Filled 2014-12-29 (×6): qty 1

## 2014-12-29 MED ORDER — LORAZEPAM 1 MG PO TABS: 0.0000 mg | ORAL_TABLET | Freq: Four times a day (QID) | ORAL | Status: DC

## 2014-12-29 MED ORDER — VITAMIN B-1 100 MG PO TABS
100.0000 mg | ORAL_TABLET | Freq: Every day | ORAL | Status: DC
  Administered 2014-12-29 – 2015-01-01 (×4): 100 mg via ORAL
  Filled 2014-12-29 (×4): qty 1

## 2014-12-29 MED ORDER — FLUTICASONE PROPIONATE 50 MCG/ACT NA SUSP
2.0000 | Freq: Every day | NASAL | Status: DC
  Administered 2014-12-29 – 2015-01-01 (×4): 2 via NASAL
  Filled 2014-12-29: qty 16

## 2014-12-29 MED ORDER — LORAZEPAM 1 MG PO TABS
1.0000 mg | ORAL_TABLET | ORAL | Status: DC | PRN
  Administered 2014-12-31 – 2015-01-01 (×3): 1 mg via ORAL
  Filled 2014-12-29 (×4): qty 1

## 2014-12-29 MED ORDER — THIAMINE HCL 100 MG/ML IJ SOLN: 100.0000 mg | Freq: Every day | INTRAMUSCULAR | Status: DC

## 2014-12-29 MED ORDER — LORAZEPAM 2 MG/ML IJ SOLN
0.0000 mg | Freq: Four times a day (QID) | INTRAMUSCULAR | Status: DC
Start: 2014-12-29 — End: 2014-12-29
  Administered 2014-12-29: 2 mg via INTRAVENOUS
  Filled 2014-12-29: qty 1

## 2014-12-29 MED ORDER — ONDANSETRON HCL 4 MG PO TABS: 4.0000 mg | ORAL_TABLET | Freq: Four times a day (QID) | ORAL | Status: DC | PRN

## 2014-12-29 MED ORDER — LORAZEPAM 2 MG/ML IJ SOLN: 0.0000 mg | Freq: Two times a day (BID) | INTRAMUSCULAR | Status: DC

## 2014-12-29 MED ORDER — ADULT MULTIVITAMIN W/MINERALS CH
1.0000 | ORAL_TABLET | Freq: Every day | ORAL | Status: DC
  Administered 2014-12-29 – 2015-01-01 (×4): 1 via ORAL
  Filled 2014-12-29 (×4): qty 1

## 2014-12-29 MED ORDER — FOLIC ACID 1 MG PO TABS
1.0000 mg | ORAL_TABLET | Freq: Every day | ORAL | Status: DC
  Administered 2014-12-29 – 2015-01-01 (×4): 1 mg via ORAL
  Filled 2014-12-29 (×4): qty 1

## 2014-12-29 MED ORDER — ONDANSETRON HCL 4 MG/2ML IJ SOLN: 4.0000 mg | Freq: Four times a day (QID) | INTRAMUSCULAR | Status: DC | PRN

## 2014-12-29 MED ORDER — THIAMINE HCL 100 MG/ML IJ SOLN
Freq: Once | INTRAVENOUS | Status: AC
  Administered 2014-12-29: 01:00:00 via INTRAVENOUS
  Filled 2014-12-29: qty 1000

## 2014-12-29 MED ORDER — ADULT MULTIVITAMIN W/MINERALS CH
1.0000 | ORAL_TABLET | Freq: Once | ORAL | Status: AC
  Administered 2014-12-29: 1 via ORAL
  Filled 2014-12-29: qty 1

## 2014-12-29 MED ORDER — MONTELUKAST SODIUM 10 MG PO TABS
10.0000 mg | ORAL_TABLET | Freq: Every day | ORAL | Status: DC
  Administered 2014-12-29 – 2014-12-31 (×3): 10 mg via ORAL
  Filled 2014-12-29 (×3): qty 1

## 2014-12-29 MED ORDER — ENOXAPARIN SODIUM 40 MG/0.4ML ~~LOC~~ SOLN
40.0000 mg | SUBCUTANEOUS | Status: DC
  Administered 2014-12-29 – 2014-12-31 (×3): 40 mg via SUBCUTANEOUS
  Filled 2014-12-29 (×3): qty 0.4

## 2014-12-29 MED ORDER — CLONAZEPAM 0.5 MG PO TABS
1.0000 mg | ORAL_TABLET | Freq: Two times a day (BID) | ORAL | Status: DC
  Administered 2014-12-29 – 2015-01-01 (×7): 1 mg via ORAL
  Filled 2014-12-29 (×7): qty 2

## 2014-12-29 MED ORDER — LORAZEPAM 2 MG/ML IJ SOLN
0.0000 mg | Freq: Two times a day (BID) | INTRAMUSCULAR | Status: DC
  Filled 2014-12-29: qty 1

## 2014-12-29 MED ORDER — LORAZEPAM 2 MG/ML IJ SOLN: 0.0000 mg | Freq: Four times a day (QID) | INTRAMUSCULAR | Status: DC

## 2014-12-29 MED ORDER — VITAMIN D 1000 UNITS PO TABS
1000.0000 [IU] | ORAL_TABLET | Freq: Every day | ORAL | Status: DC
  Administered 2014-12-29 – 2015-01-01 (×4): 1000 [IU] via ORAL
  Filled 2014-12-29 (×4): qty 1

## 2014-12-29 MED ORDER — TIOTROPIUM BROMIDE MONOHYDRATE 18 MCG IN CAPS
18.0000 ug | ORAL_CAPSULE | Freq: Every day | RESPIRATORY_TRACT | Status: DC
  Administered 2014-12-29 – 2015-01-01 (×3): 18 ug via RESPIRATORY_TRACT
  Filled 2014-12-29: qty 5

## 2014-12-29 MED ORDER — LORAZEPAM 1 MG PO TABS: 1.0000 mg | ORAL_TABLET | Freq: Four times a day (QID) | ORAL | Status: DC | PRN

## 2014-12-29 MED ORDER — ATORVASTATIN CALCIUM 40 MG PO TABS
40.0000 mg | ORAL_TABLET | Freq: Every day | ORAL | Status: DC
  Administered 2014-12-29 – 2014-12-31 (×3): 40 mg via ORAL
  Filled 2014-12-29 (×3): qty 1

## 2014-12-29 MED ORDER — ASPIRIN EC 81 MG PO TBEC
81.0000 mg | DELAYED_RELEASE_TABLET | Freq: Every day | ORAL | Status: DC
  Administered 2014-12-29 – 2015-01-01 (×4): 81 mg via ORAL
  Filled 2014-12-29 (×4): qty 1

## 2014-12-29 MED ORDER — VITAMIN B-1 100 MG PO TABS: 100.0000 mg | ORAL_TABLET | Freq: Every day | ORAL | Status: DC

## 2014-12-29 MED ORDER — ALBUTEROL SULFATE (2.5 MG/3ML) 0.083% IN NEBU
2.5000 mg | INHALATION_SOLUTION | RESPIRATORY_TRACT | Status: DC | PRN
  Administered 2014-12-29 – 2015-01-01 (×3): 2.5 mg via RESPIRATORY_TRACT
  Filled 2014-12-29 (×4): qty 3

## 2014-12-29 MED ORDER — SODIUM CHLORIDE 0.9 % IJ SOLN
3.0000 mL | Freq: Two times a day (BID) | INTRAMUSCULAR | Status: DC
  Administered 2014-12-29 – 2015-01-01 (×6): 3 mL via INTRAVENOUS

## 2014-12-29 MED ORDER — LORAZEPAM 2 MG/ML IJ SOLN
1.0000 mg | Freq: Four times a day (QID) | INTRAMUSCULAR | Status: DC | PRN
  Administered 2014-12-29 (×2): 1 mg via INTRAVENOUS
  Filled 2014-12-29 (×2): qty 1

## 2014-12-29 NOTE — ED Notes (Signed)
[facility] was notified of pt's transfer to [facility]. 

## 2014-12-29 NOTE — H&P (Signed)
Family Medicine Teaching [facility] Admission History and Physical Service Pager: [phone]  Patient name: Juan [name] Medical record number: [id number] Date of birth: 1950-05-31 Age: 64 y.o. Gender: male  Primary Care Provider: Renold Don, MD Consultants: none Code Status: full  Chief Complaint: Feeling sick and crazy  Assessment and Plan: MARVIS SAEFONG is a 64 y.o. male presenting with desire for alcohol detox. PMH is significant for alcoholism, COPD and afib.  # Alcoholism: Binging on several pints of bourbon daily for the past week and not eating or drinking anything else. Last drink was sometime early yesterday (~12pm 11/23). Lactic acidosis, ketoacidosis and tachycardia likely all related to alcohol use and dehydration. No s/sx of infection.  - admit to telemetry for detox, attending Dr. Jennette Kettle - CIWA protocol plus scheduled klonopin  BID given heavy use - repeat lactic acid now - s/p 3L fluid bolus, continue MIVF until PO improves - repeat BMP, CBC daily - SW consult for rehab options, despite pt's original presentation to behavioral health do not think he would be admitted by psychiatry given his lack of SI/HI or hallucinations. Consider consult for their assistance with substance abuse issues.  # COPD: No signs of acute exacerbation. No increased cough or dyspnea. Normal effort and satting well on room air. - continue home spiriva, singulair and prn albuterol  # Afib: Sinus rhythm this admission. Tachycardia thought to be related to volume depletion and withdrawal rather than RVR - monitor on tele - resume home diltiazem (pt off all meds x1 week) - not anticoagulated due to significant fall risk, continue home ASA   # HTN: Moderately elevated BP here despite volume depletion but off home meds. - resume home lisinopril  # HFrEF: Past history of EF 30-35% with akinesis of basal inferior myocardium in 2014. Most recent ECHO in 11/2014 shows EF 50-55%,  moderate concentric hypertrophy, akinesis of basal inferior myocardium, left atrium mildly dilated.  - ASA, atorvastatin  FEN/GI: Heart healthy diet, NS@100mL /hr Prophylaxis: lovenox  Disposition: admit to inpatient for alcohol detox  History of Present Illness:  Juan [name] is a 64 y.o. male presenting with alcohol withdrawal.  Yesterday, he told his wife he wanted to be voluntarily committed and he was insane. Said everyone would be better off without him. He said he needed to do something about his drinking. Called the office, told to go to [employer]. Wife asked if they would check vitals and make sure he was okay and they said they would have to go to the ED for that. Hasn't been eating. Has been drinking a lot of alcohol. Fell down Tuesday or Wednesday, wife didn't see him fall but he was crawling. He keeps saying he is sick, but he can't tell you where at. He just says he's sick all over. He was moaning and groaning on Tuesday night, as if he was really in pain. Wife asked him where he hurts and he said all over. He has not taken any of his medications in the last week. His last drink was yesterday, wife is not sure when. On Tuesday, went to the [facility] store, wife hid his alcohol to try to slow him down. He is currently nauseated. Wife hasn't noticed him having any problems with breathing. Pt states he feels hot, but not having any pain. Endorses a little difficulty breathing, but says it is like normal. He denies any SI/HI. States he feels really weak.  Review Of Systems: Per HPI  Otherwise the remainder of  the systems were negative.  Patient Active Problem List   Diagnosis Date Noted  . Lactic acidosis 12/29/2014  . Altered mental state 12/29/2014  . History of recent fall   . COPD exacerbation (HCC)   . Dyspnea 12/16/2014  . Alcoholism (HCC) 11/14/2014  . Atrial fibrillation with rapid ventricular response (HCC) 11/14/2014  . Sinusitis, chronic 08/05/2014  . Fall   .  Weakness   . Cough   . Tachycardia 02/12/2014  . Hypokalemia 01/26/2014  . Chronic diastolic heart failure (HCC)   . Peripheral neuropathy (HCC) 12/15/2013  . Bronchiectasis without acute exacerbation (HCC) 06/30/2013  . Lung nodule seen on imaging study 06/15/2013  . Chronic systolic heart failure (HCC) 06/13/2013  . Transaminitis 06/13/2013  . RBBB 11/09/2012  . Hyponatremia 11/08/2012  . Umbilical hernia 08/06/2012  . Smoker unmotivated to quit 07/22/2012  . Tremor 07/30/2011  . HTN (hypertension) 07/30/2011  . Hepatomegaly 11/05/2010  . Depression 11/05/2010  . Anxiety 11/05/2010    Past Medical History: Past Medical History  Diagnosis Date  . CHF (congestive heart failure) (HCC) 10/2010    ECHO:  EF 40%, Grade II diastolic dysfunction  . Alcoholism (HCC)   . Allergy     seasonal   . Anxiety   . Depression   . Asthma   . Hyperlipidemia   . Hypertension   . Seizures (HCC)   . COPD (chronic obstructive pulmonary disease) (HCC)   . Headache     Past Surgical History: Past Surgical History  Procedure Laterality Date  . Toenail excision Right 1967    Removal of ingrown toenail [Other]    Social History: Social History  Substance Use Topics  . Smoking status: Current Every Day Smoker -- 1.50 packs/day for 40 years    Types: Cigarettes  . Smokeless tobacco: Never Used     Comment: a little more than 1 ppd (02/17/14)  . Alcohol Use: Yes     Comment: 1/2 galloon liquior in 2 days     12/28/2014 wife reports 32oz / day   Please also refer to relevant sections of EMR.  Family History: Family History  Problem Relation Age of Onset  . Alzheimer's disease Mother   . Diabetes Mother   . Heart disease Maternal Uncle     Allergies and Medications: Allergies  Allergen Reactions  . Coreg [Carvedilol] Shortness Of Breath   No current facility-administered medications on file prior to encounter.   Current Outpatient Prescriptions on File Prior to Encounter   Medication Sig Dispense Refill  . acetaminophen (TYLENOL) 325 MG tablet Take 2 tablets (650 mg total) by mouth every 6 (six) hours as needed for moderate pain. 30 tablet 0  . albuterol (PROVENTIL) (2.5 MG/3ML) 0.083% nebulizer solution USE 3 MLS BY NEBULIZATION EVERY 6 HOURS AS NEEDED FOR WHEEZING OR SHORTNESS OF BREATH 360 mL 1  . aspirin EC 81 MG EC tablet Take 1 tablet (81 mg total) by mouth daily. 90 tablet 0  . atorvastatin (LIPITOR) 40 MG tablet Take 1 tablet (40 mg total) by mouth daily at 6 PM. 30 tablet 6  . cholecalciferol (VITAMIN D) 1000 UNITS tablet Take 1,000 Units by mouth daily.    . clonazePAM (KLONOPIN) 1 MG tablet Take 1 tablet (1 mg total) by mouth 2 (two) times daily as needed for anxiety. 90 tablet 1  . COMBIVENT RESPIMAT 20-100 MCG/ACT AERS respimat INHALE TWO PUFFS BY MOUTH EVERY FOUR HOURS AS NEEDED FOR WHEEZING 1 Inhaler 3  . diltiazem (CARDIZEM  CD) 180 MG 24 hr capsule Take 1 capsule (180 mg total) by mouth daily. 30 capsule 0  . DULoxetine (CYMBALTA) 30 MG capsule TAKE ONE CAPSULE BY MOUTH ONE TIME DAILY 30 capsule 3  . fluticasone (FLONASE) 50 MCG/ACT nasal spray Use two sprays in each nostril daily 16 g 11  . lisinopril (PRINIVIL,ZESTRIL) 5 MG tablet TAKE 1 TABLET BY MOUTH EVERY DAY 30 tablet 3  . Magnesium 500 MG TABS Take 500 mg by mouth daily.    . montelukast (SINGULAIR) 10 MG tablet TAKE ONE TABLET BY MOUTH NIGHTLY AT BEDTIME 30 tablet 3  . Multiple Vitamin (MULTIVITAMIN WITH MINERALS) TABS tablet Take 1 tablet by mouth daily.    Marland Kitchen tiotropium (SPIRIVA) 18 MCG inhalation capsule Place 1 capsule (18 mcg total) into inhaler and inhale daily. 30 capsule 6  . azithromycin (ZITHROMAX) 250 MG tablet Take 1 tablet (250 mg total) by mouth daily. 3 tablet 0  . predniSONE (DELTASONE) 50 MG tablet Take 1 tablet (50 mg total) by mouth daily with breakfast. (Patient not taking: Reported on 12/28/2014) 3 tablet 0  . [DISCONTINUED] albuterol (PROVENTIL,VENTOLIN) 90 MCG/ACT  inhaler Inhale 2 puffs into the lungs every 6 (six) hours as needed for wheezing. 17 g 0    Objective: BP 158/88 mmHg  Pulse 115  Temp(Src) 98.4 F (36.9 C) (Oral)  Resp 20  Ht 5\' 8"  (1.727 m)  Wt 187 lb 0.3 oz (84.83 kg)  BMI 28.44 kg/m2  SpO2 95% Exam: General: unkempt elderly male, asleep but arousable and  and answers questions appropriately  Eyes: EOMI, PERRL ENTM: MMM, pharynx normal, missing teeth Neck: supple Cardiovascular: regular rhythm (difficult to auscultate due to breath sounds and body habitus); peripheral pulses intact; no JVD  Respiratory: no increased work of breathing, no crackles; no wheezing Abdomen: soft, NT, ND, ventral/suprapubic hernia not incarcerated, hepatomegaly  MSK: moves extremities without difficulty; peripheral pulses intact bilaterally, severe onychomycosis of toes. No LEE. Skin: warm and dry  Neuro: neuro grossly intact  Psych: normal speech, denies SI/HI but states he is crazy because he needs to get help for his drinking  Labs and Imaging: CBC BMET   Recent Labs Lab 12/28/14 2106  WBC 9.4  HGB 14.7  HCT 41.9  PLT 261    Recent Labs Lab 12/28/14 2106  NA 134*  K 4.1  CL 96*  CO2 20*  BUN 16  CREATININE 0.95  GLUCOSE 149*  CALCIUM 8.2*     LA 4.09 Trop 0.01 Mag 1.6 Phos 2.9 Albumin 3.1 Salicylate <4 Tylenol <10 Alcohol 283   12/28/2014 23:00  pH, Ven 7.417 (H)  pCO2, Ven 28.1 (L)  pO2, Ven 71.4 (H)  Bicarbonate 17.8 (L)  TCO2 15.9  Acid-base deficit 5.1 (H)  O2 Saturation 92.9  Temperature 98.6  Collection site VEIN    Campbell Stall, MD 12/29/2014, 7:07 AM PGY-1, [facility]  Beverely Low, MD, MPH [facility] PGY-3 12/29/2014 8:18 AM FPTS Intern pager: [phone], text pages welcome

## 2014-12-29 NOTE — ED Notes (Signed)
[facility] here to transfer pt to [facility]. 

## 2014-12-30 DIAGNOSIS — I10 Essential (primary) hypertension: Secondary | ICD-10-CM

## 2014-12-30 LAB — MAGNESIUM
Magnesium: 1.7 mg/dL (ref 1.7–2.4)
Magnesium: 1.9 mg/dL (ref 1.7–2.4)

## 2014-12-30 LAB — CBC
HCT: 32.3 % — ABNORMAL LOW (ref 39.0–52.0)
Hemoglobin: 10.7 g/dL — ABNORMAL LOW (ref 13.0–17.0)
MCH: 30.8 pg (ref 26.0–34.0)
MCHC: 33.1 g/dL (ref 30.0–36.0)
MCV: 93.1 fL (ref 78.0–100.0)
PLATELETS: 151 10*3/uL (ref 150–400)
RBC: 3.47 MIL/uL — ABNORMAL LOW (ref 4.22–5.81)
RDW: 14.3 % (ref 11.5–15.5)
WBC: 7.8 10*3/uL (ref 4.0–10.5)

## 2014-12-30 LAB — BASIC METABOLIC PANEL
ANION GAP: 7 (ref 5–15)
Anion gap: 4 — ABNORMAL LOW (ref 5–15)
BUN: 7 mg/dL (ref 6–20)
BUN: 5 mg/dL — AB (ref 6–20)
CALCIUM: 7.5 mg/dL — AB (ref 8.9–10.3)
CALCIUM: 7.9 mg/dL — AB (ref 8.9–10.3)
CHLORIDE: 100 mmol/L — AB (ref 101–111)
CO2: 24 mmol/L (ref 22–32)
CO2: 26 mmol/L (ref 22–32)
CREATININE: 0.77 mg/dL (ref 0.61–1.24)
CREATININE: 0.83 mg/dL (ref 0.61–1.24)
Chloride: 103 mmol/L (ref 101–111)
GFR calc Af Amer: >60 mL/min (ref >60)
GFR calc non Af Amer: >60 mL/min (ref >60)
GFR calc non Af Amer: >60 mL/min (ref >60)
GLUCOSE: 134 mg/dL — AB (ref 65–99)
Glucose, Bld: 147 mg/dL — ABNORMAL HIGH (ref 65–99)
Potassium: 2.7 mmol/L — CL (ref 3.5–5.1)
Potassium: 3.9 mmol/L (ref 3.5–5.1)
SODIUM: 131 mmol/L — AB (ref 135–145)
Sodium: 133 mmol/L — ABNORMAL LOW (ref 135–145)

## 2014-12-30 LAB — PHOSPHORUS: Phosphorus: 1.7 mg/dL — ABNORMAL LOW (ref 2.5–4.6)

## 2014-12-30 MED ORDER — K PHOS MONO-SOD PHOS DI & MONO 155-852-130 MG PO TABS
250.0000 mg | ORAL_TABLET | Freq: Once | ORAL | Status: AC
  Administered 2014-12-30: 250 mg via ORAL
  Filled 2014-12-30: qty 1

## 2014-12-30 MED ORDER — POTASSIUM CHLORIDE CRYS ER 20 MEQ PO TBCR
40.0000 meq | EXTENDED_RELEASE_TABLET | Freq: Three times a day (TID) | ORAL | Status: AC
  Administered 2014-12-30 (×3): 40 meq via ORAL
  Filled 2014-12-30 (×3): qty 2

## 2014-12-30 MED ORDER — MAGNESIUM SULFATE IN D5W 10-5 MG/ML-% IV SOLN
1.0000 g | Freq: Once | INTRAVENOUS | Status: AC
  Administered 2014-12-30: 1 g via INTRAVENOUS
  Filled 2014-12-30: qty 100

## 2014-12-30 NOTE — Progress Notes (Signed)
Family Medicine Teaching Service Daily Progress Note Intern Pager: [phone]  Patient name: Juan [name] Medical record number: [id number] Date of birth: 1950-11-21 Age: 64 y.o. Gender: male  Primary Care Provider: Renold Don, MD Consultants: None Code Status: Full  Pt Overview and Major Events to Date:  11/14: Admitted to FPTS for alcohol detox  Assessment and Plan: Juan [name] is a 64 y.o. male presenting with desire for alcohol detox. PMH is significant for alcoholism, COPD and afib.  Alcoholism: Binging on several pints of bourbon daily for the past week and not eating or drinking anything else. Last drink was sometime early yesterday (~12pm 11/23). Lactic acidosis, ketoacidosis and tachycardia likely all related to alcohol use and dehydration. No s/sx of infection. Lactic acid has trended down from 4.09 on admission > 1.2 on repeat.  - CIWA protocol plus scheduled klonopin 1mg  BID given heavy use. CIWA scores have been 11 > 14 > 12 > 4. - MIVFs: NS @ 15ml/hr - Continue daily Folic acid, Thiamine, and Multivitamin. - Will watch K, Mag, and Phos carefully. K was 2.7 this am, replacing with K-Dur tid.  -  Mag was 1.7 this am. Jodelle Green has been replaced x 1. Will replace Mag again this morning.  - Repeat BMET, Mag, and Phos this afternoon. - Given his lactic acidosis in the ED, blood cultures were drawn and are pending. - SW consult for rehab options, despite Pt's original presentation to behavioral health do not think he would be admitted by psychiatry given his lack of SI/HI. Consider consult for their assistance with substance abuse issues.  COPD, stable: No signs of acute exacerbation. No increased cough or dyspnea. Normal effort and satting well on room air. - Continue home spiriva, singulair and prn albuterol  Afib, stable: Pt has been in sinus rhythm this admission. Tachycardia thought to be related to volume depletion and withdrawal rather than RVR. Pt has not taken  any meds in the last week. - Monitor on telemetry - Will continue home Diltiazem - Not anticoagulated due to significant fall risk, continue home ASA 81mg   HTN, stable: BPs ranging from 109-121/53-66 in the last 24 hours. Pt has not taken BP meds in 1 week. - Resume home Lisinopril 5mg  daily  HFrEF, stable: Past history of EF 30-35% with akinesis of basal inferior myocardium in 2014. Most recent ECHO in 11/2014 shows EF 50-55%, moderate concentric hypertrophy, akinesis of basal inferior myocardium, left atrium mildly dilated.  - Continue home meds: ASA and Lipitor 40mg  daily  Depression: Likely contributing to alcohol use. - Continue home med: Cymbalta 30mg  daily  FEN/GI: Heart healthy diet, continue NS@100mL /hr as Pt has only been eating/drinking a little. Prophylaxis: Lovenox  Disposition: SW consult to recommend rehab facilities. Potentially home 11/26?  Subjective:  Pt states he is doing fine this morning. He has felt shaky, but denies any hallucinations. He has been eating and drinking a little. He states he wants to go home today. He does not think he will go home and drink, but he cannot think of a reason to stop drinking. He is unsure at this point if he would like to go to a rehab facility. Per [name], he was confused and agitated this morning. She does not think she can manage him at home while he is like this.  Objective: Temp:  [99.9 F (37.7 C)] 99.9 F (37.7 C) (11/25 0509) Pulse Rate:  [103-108] 108 (11/25 0509) Resp:  [18] 18 (11/25 0509) BP: (109-121)/(53-66) 121/66  mmHg (11/25 0509) SpO2:  [97 %-98 %] 98 % (11/25 0509) Weight:  [184 lb (83.462 kg)] 184 lb (83.462 kg) (11/25 0509) Physical Exam: General: unkempt elderly male, sleepy but arousable, seems somewhat confused. HEENT: [place]/AT, EOMI, MMM, poor dentition. Neck: supple Cardiovascular: Tachycardic, regular rhythm; peripheral pulses intact; no JVD  Respiratory: no increased work of breathing, no  crackles; no wheezing Abdomen: soft, NT, ND, ventral/suprapubic hernia not incarcerated, hepatomegaly  MSK: No edema, moves all 4 limbs spontaneously. Skin: Warm and dry  Neuro: Awake, alert, oriented, CN 2-12 grossly intact Psych: normal speech, denies SI/HI.  Laboratory:  Recent Labs Lab 12/28/14 2106 12/29/14 0816 12/30/14 0249  WBC 9.4 12.7* 7.8  HGB 14.7 12.6* 10.7*  HCT 41.9 35.8* 32.3*  PLT 261 206 151    Recent Labs Lab 12/28/14 2300 12/29/14 0816 12/29/14 1526 12/30/14 0249  NA  --  133* 132* 131*  K  --  4.1 3.7 2.7*  CL  --  102 101 100*  CO2  --  18* 23 24  BUN  --  CREATININE  --  0.93 0.78 0.77  CALCIUM  --  7.4* 7.5* 7.5*  PROT 5.9*  --   --   --   BILITOT 1.1  --   --   --   ALKPHOS 81  --   --   --   ALT 39  --   --   --   AST 46*  --   --   --   GLUCOSE  --  144* 118* 147*   Lactic acid: 4.09 > 1.2 UA: No bacteria, trace Hgb, negative ketones, negative leukocytes, negative nitrites, no WBCs UDS: Positive for benzos I-stat troponin: 0.01 Salicylate level <4.0 Acetaminophen level <10 Ethyl Alcohol: 283  Imaging/Diagnostic Tests: CXR: Patchy areas of infiltrate, less apparent compared to recent studies. Somewhat spiculated area in the left base may represent focal pneumonia but could also represent a small neoplastic focus. Recommend f/u chest CT in 4-6 weeks to further evaluate.  Campbell Stall, MD 12/30/2014, 6:42 AM PGY-1, [facility] Family Medicine FPTS Intern pager: [phone], text pages welcome

## 2014-12-30 NOTE — Discharge Summary (Signed)
Family Medicine Teaching [facility] Discharge Summary  Patient name: Juan [name] Medical record number: [id number] Date of birth: 1950/03/20 Age: 64 y.o. Gender: male Date of Admission: 12/28/2014  Date of Discharge: 01/01/2015 Admitting Physician: Nestor Ramp, MD  Primary Care Provider: Renold Don, MD Consultants: None  Indication for Hospitalization: Alcohol detox  Discharge Diagnoses/Problem List:  Alcohol abuse Chronic Afib COPD HTN HFrEF Depression Tobacco abuse  Disposition: Home  Discharge Condition: Stable, improved  Discharge Exam:  General: 64yo male resting comfortably in no apparent distress, alert and answers questions appropriately. HEENT: [place]/AT, MMM, poor dentition. Neck: supple Cardiovascular: regular rate and rhythm; no murmurs noted  Respiratory: no increased work of breathing, no crackles; no wheezing Abdomen: soft, NT, ND, ventral/suprapubic hernia not incarcerated, hepatomegaly  MSK: No edema, moves all 4 limbs spontaneously. Skin: Warm and dry  Neuro: Awake, alert, oriented x 3, no tremor noted Psych: normal speech, denies SI/HI.  Brief Hospital Course:  Juan [name] is a 64 year old male who presented to [facility] for alcohol detox. He stated he didn't feel well so was told to go to the [facility] ED and was subsequently transferred to [facility] for further management of his alcohol detox. In the ED, he was found to have a lactic acidosis to 4.09 with tachycardia. We thought this was likely due to his alcohol use and dehydration. He did not have any signs of infection. Blood cultures were drawn in the ED and they showed no growth. He received 3L fluid bolus and MIVFs and his lactic acid resolved. He was started on CIWA protocol plus Klonopin 1mg  bid, given his heavy alcohol use recently. He was given daily folic acid, thiamine, and a multivitamin. He was found to have low K, low Mag, and low Phos, which were all  replaced. Social work was consulted for outpatient alcohol rehab options and he was given a list of facilities that he could contact. He also received substance abuse counseling. Throughout his hospitalization, his CIWA scores improved and he was discharged home.  His other medical conditions were stable and no changes were made to his home medications.  Issues for Follow Up:  1. CXR in the ED showed a spiculated area in the left base may represent focal pneumonia but could also represent a small neoplastic focus. Recommend f/u chest CT in 4-6 weeks to further evaluate. 2. We recommend continued substance abuse counseling as an outpatient.  Significant Procedures: None  Significant Labs and Imaging:   Recent Labs Lab 12/28/14 2106 12/29/14 0816 12/30/14 0249  WBC 9.4 12.7* 7.8  HGB 14.7 12.6* 10.7*  HCT 41.9 35.8* 32.3*  PLT 261 206 151    Recent Labs Lab 12/28/14 2300 12/29/14 0816 12/29/14 1526 12/30/14 0249 12/30/14 1500 12/31/14 0425 12/31/14 0730 01/01/15 0318  NA  --  133* 132* 131* 133* 132*  --  132*  K  --  4.1 3.7 2.7* 3.9 3.5  --  4.6  CL  --  102 101 100* 103 101  --  104  CO2  --  18* 23 24 26 25   --  23  GLUCOSE  --  144* 118* 147* 134* 95  --  113*  BUN  --  10 10 7  5* 6  --  8  CREATININE  --  0.93 0.78 0.77 0.83 0.73  --  0.76  CALCIUM  --  7.4* 7.5* 7.5* 7.9* 8.0*  --  8.3*  MG 1.6*  --  2.0 1.7 1.9  --  1.6* 1.6*  PHOS  --  2.9  --   --  1.7*  --  2.2* 3.1  ALKPHOS 81  --   --   --   --   --   --   --   AST 46*  --   --   --   --   --   --   --   ALT 39  --   --   --   --   --   --   --   ALBUMIN 3.1*  --   --   --   --   --   --   --    Lactic acid: 4.09 > 1.2 UA: No bacteria, trace Hgb, negative ketones, negative leukocytes, negative nitrites, no WBCs UDS: Positive for benzos I-stat troponin: 0.01 Salicylate level <4.0 Acetaminophen level <10 Ethyl Alcohol: 283 Blood cultures: no growth  CXR: Patchy areas of infiltrate, less apparent  compared to recent studies. Somewhat spiculated area in the left base may represent focal pneumonia but could also represent a small neoplastic focus. Given this finding, a follow-up chest CT in 4-6 weeks is advised to further evaluate.    Results/Tests Pending at Time of Discharge: None  Discharge Medications:    Medication List    STOP taking these medications        azithromycin 250 MG tablet  Commonly known as:  ZITHROMAX     predniSONE 50 MG tablet  Commonly known as:  DELTASONE      TAKE these medications        acetaminophen 325 MG tablet  Commonly known as:  TYLENOL  Take 2 tablets (650 mg total) by mouth every 6 (six) hours as needed for moderate pain.     albuterol (2.5 MG/3ML) 0.083% nebulizer solution  Commonly known as:  PROVENTIL  USE 3 MLS BY NEBULIZATION EVERY 6 HOURS AS NEEDED FOR WHEEZING OR SHORTNESS OF BREATH     aspirin 81 MG EC tablet  Take 1 tablet (81 mg total) by mouth daily.     atorvastatin 40 MG tablet  Commonly known as:  LIPITOR  Take 1 tablet (40 mg total) by mouth daily at 6 PM.     cholecalciferol 1000 UNITS tablet  Commonly known as:  VITAMIN D  Take 1,000 Units by mouth daily.     clonazePAM 1 MG tablet  Commonly known as:  KLONOPIN  Take 1 tablet (1 mg total) by mouth 2 (two) times daily as needed for anxiety.     COMBIVENT RESPIMAT 20-100 MCG/ACT Aers respimat  Generic drug:  Ipratropium-Albuterol  INHALE TWO PUFFS BY MOUTH EVERY FOUR HOURS AS NEEDED FOR WHEEZING     diltiazem 180 MG 24 hr capsule  Commonly known as:  CARDIZEM CD  Take 1 capsule (180 mg total) by mouth daily.     DULoxetine 30 MG capsule  Commonly known as:  CYMBALTA  TAKE ONE CAPSULE BY MOUTH ONE TIME DAILY     fluticasone 50 MCG/ACT nasal spray  Commonly known as:  FLONASE  Use two sprays in each nostril daily     hydroxypropyl methylcellulose / hypromellose 2.5 % ophthalmic solution  Commonly known as:  ISOPTO TEARS / GONIOVISC  Place 1 drop into  both eyes 3 (three) times daily as needed for dry eyes.     lisinopril 5 MG tablet  Commonly known as:  PRINIVIL,ZESTRIL  TAKE 1 TABLET BY MOUTH EVERY  DAY     Magnesium 500 MG Tabs  Take 500 mg by mouth daily.     montelukast 10 MG tablet  Commonly known as:  SINGULAIR  TAKE ONE TABLET BY MOUTH NIGHTLY AT BEDTIME     multivitamin with minerals Tabs tablet  Take 1 tablet by mouth daily.     tiotropium 18 MCG inhalation capsule  Commonly known as:  SPIRIVA  Place 1 capsule (18 mcg total) into inhaler and inhale daily.        Discharge Instructions: Please refer to Patient Instructions section of EMR for full details.  Patient was counseled important signs and symptoms that should prompt return to medical care, changes in medications, dietary instructions, activity restrictions, and follow up appointments.   Follow-Up Appointments: Follow-up Information    Follow up with [facility], MD On 01/03/2015.   Specialty:  Family Medicine   Why:  :45am for Hospital Follow Up   Contact information:   [address] [place] [address] [phone]       Campbell Stall, MD 01/01/2015, 7:34 PM PGY-1, [facility] Family Medicine

## 2014-12-31 LAB — BASIC METABOLIC PANEL
Anion gap: 6 (ref 5–15)
BUN: 6 mg/dL (ref 6–20)
CO2: 25 mmol/L (ref 22–32)
CREATININE: 0.73 mg/dL (ref 0.61–1.24)
Calcium: 8 mg/dL — ABNORMAL LOW (ref 8.9–10.3)
Chloride: 101 mmol/L (ref 101–111)
GFR calc Af Amer: >60 mL/min (ref >60)
GFR calc non Af Amer: >60 mL/min (ref >60)
GLUCOSE: 95 mg/dL (ref 65–99)
Potassium: 3.5 mmol/L (ref 3.5–5.1)
Sodium: 132 mmol/L — ABNORMAL LOW (ref 135–145)

## 2014-12-31 LAB — PHOSPHORUS: PHOSPHORUS: 2.2 mg/dL — AB (ref 2.5–4.6)

## 2014-12-31 LAB — URINE CULTURE: CULTURE: NO GROWTH

## 2014-12-31 LAB — MAGNESIUM: MAGNESIUM: 1.6 mg/dL — AB (ref 1.7–2.4)

## 2014-12-31 MED ORDER — MAGNESIUM SULFATE IN D5W 10-5 MG/ML-% IV SOLN
1.0000 g | Freq: Once | INTRAVENOUS | Status: AC
  Administered 2014-12-31: 1 g via INTRAVENOUS
  Filled 2014-12-31: qty 100

## 2014-12-31 MED ORDER — K PHOS MONO-SOD PHOS DI & MONO 155-852-130 MG PO TABS
250.0000 mg | ORAL_TABLET | Freq: Once | ORAL | Status: AC
  Administered 2014-12-31: 250 mg via ORAL
  Filled 2014-12-31: qty 1

## 2014-12-31 MED ORDER — POTASSIUM CHLORIDE CRYS ER 20 MEQ PO TBCR
40.0000 meq | EXTENDED_RELEASE_TABLET | Freq: Three times a day (TID) | ORAL | Status: AC
  Administered 2014-12-31 (×3): 40 meq via ORAL
  Filled 2014-12-31 (×3): qty 2

## 2014-12-31 NOTE — Progress Notes (Signed)
Family Medicine Teaching Service Daily Progress Note Intern Pager: [phone]  Patient name: Juan [name] Medical record number: [id number] Date of birth: Sep 24, 1950 Age: 64 y.o. Gender: male  Primary Care Provider: Renold Don, MD Consultants: None Code Status: Full  Pt Overview and Major Events to Date:  11/14: Admitted to FPTS for alcohol detox  Assessment and Plan: Juan [name] is a 64 y.o. male presenting with desire for alcohol detox. PMH is significant for alcoholism, COPD and afib.  Alcoholism: Binging on several pints of bourbon daily for the past week and not eating or drinking anything else. Last drink was (~12pm 11/23). Lactic acid has trended down from 4.09 on admission > 1.2 on repeat. Likely secondary to alcohol, as he has no active signs of infection. Pt states he still feels shaky this morning, but appears less confused on exam. - CIWA protocol plus scheduled klonopin 1mg  BID given heavy use. CIWA scores are still elevated at 9 > 11 > 8 > 0 > 8. - MIVFs: NS @ 145ml/hr - Continue daily Folic acid, Thiamine, and Multivitamin. - Will watch K, Mag, and Phos carefully.   - K 3.5 this am, continue replacement with K-Dur tid.  - Mag was 1.6 this am. Will replace with 1g magnesium sulfate x 1.  - Phos 2.2 this am. Will replace with Phos 250mg  x 1.  - Given his lactic acidosis in the ED, blood cultures were drawn and show NG x 1 day. - SW will give him a list of rehab facilities, but he will have to set them up himself.  COPD, stable: No signs of acute exacerbation. No increased cough or dyspnea. Normal effort and satting well on room air. - Continue home spiriva, singulair and prn albuterol  Afib, stable: Pt has been in sinus rhythm this admission. He has had tachycardia throughout admission, that has improved overnight to the 90s. Tachycardia likely secondary to withdrawal rather than RVR. Pt has not taken any meds in the last week. - Monitor on telemetry - Will  continue home Diltiazem - Not anticoagulated due to significant fall risk, continue home ASA 81mg   HTN, stable: BPs ranging from 110-127/66-74 in the last 24 hours.  - Continue home Lisinopril 5mg  daily  HFrEF, stable: Past history of EF 30-35% with akinesis of basal inferior myocardium in 2014. Most recent ECHO in 11/2014 shows EF 50-55%, moderate concentric hypertrophy, akinesis of basal inferior myocardium, left atrium mildly dilated.  - Continue home meds: ASA and Lipitor 40mg  daily  Depression: Likely contributing to alcohol use. - Continue home med: Cymbalta 30mg  daily  FEN/GI: Heart healthy diet, continue NS@100mL /hr as Pt has only been eating/drinking a little. Prophylaxis: Lovenox  Disposition: SW consult to recommend rehab facilities. Discharge home once CIWA scores have improved. He will be >96 hours since his last drink later today.  Subjective:  Pt is doing well this morning. He states he still feels shaky, but is feeling better than when he first came in. He wants to go home and not drink this time.  Objective: Temp:  [98.1 F (36.7 C)-98.9 F (37.2 C)] 98.4 F (36.9 C) (11/26 0405) Pulse Rate:  [85-96] 94 (11/26 0405) Resp:  [18] 18 (11/26 0405) BP: (110-127)/(66-74) 123/67 mmHg (11/26 0405) SpO2:  [95 %-98 %] 95 % (11/26 0405) Weight:  [181 lb 11.2 oz (82.419 kg)] 181 lb 11.2 oz (82.419 kg) (11/26 0405) Physical Exam: General: unkempt elderly male, alert and answers questions appropriately. HEENT: [place]/AT, EOMI, MMM, poor dentition.  Neck: supple Cardiovascular: Tachycardic, regular rhythm; peripheral pulses intact; no JVD  Respiratory: no increased work of breathing, no crackles; no wheezing Abdomen: soft, NT, ND, ventral/suprapubic hernia not incarcerated, hepatomegaly  MSK: No edema, moves all 4 limbs spontaneously. Skin: Warm and dry  Neuro: Awake, alert, oriented x 3, CN 2-12 grossly intact Psych: normal speech, denies SI/HI.  Laboratory:  Recent  Labs Lab 12/28/14 2106 12/29/14 0816 12/30/14 0249  WBC 9.4 12.7* 7.8  HGB 14.7 12.6* 10.7*  HCT 41.9 35.8* 32.3*  PLT 261 206 151    Recent Labs Lab 12/28/14 2300  12/30/14 0249 12/30/14 1500 12/31/14 0425  NA  --   < > 131* 133* 132*  K  --   < > 2.7* 3.9 3.5  CL  --   < > 100* 103 101  CO2  --   < > BUN  --   < > 7 5* 6  CREATININE  --   < > 0.77 0.83 0.73  CALCIUM  --   < > 7.5* 7.9* 8.0*  PROT 5.9*  --   --   --   --   BILITOT 1.1  --   --   --   --   ALKPHOS 81  --   --   --   --   ALT 39  --   --   --   --   AST 46*  --   --   --   --   GLUCOSE  --   < > 147* 134* 95  < > = values in this interval not displayed. Lactic acid: 4.09 > 1.2 UA: No bacteria, trace Hgb, negative ketones, negative leukocytes, negative nitrites, no WBCs UDS: Positive for benzos I-stat troponin: 0.01 Salicylate level <4.0 Acetaminophen level <10 Ethyl Alcohol: 283  Imaging/Diagnostic Tests: CXR: Patchy areas of infiltrate, less apparent compared to recent studies. Somewhat spiculated area in the left base may represent focal pneumonia but could also represent a small neoplastic focus. Recommend f/u chest CT in 4-6 weeks to further evaluate.  Campbell Stall, MD 12/31/2014, 6:57 AM PGY-1, [facility] FPTS Intern pager: [phone], text pages welcome

## 2015-01-01 ENCOUNTER — Telehealth: Payer: Self-pay | Admitting: Family Medicine

## 2015-01-01 DIAGNOSIS — E872 Acidosis, unspecified: Secondary | ICD-10-CM | POA: Insufficient documentation

## 2015-01-01 LAB — BASIC METABOLIC PANEL
ANION GAP: 5 (ref 5–15)
BUN: 8 mg/dL (ref 6–20)
CHLORIDE: 104 mmol/L (ref 101–111)
CO2: 23 mmol/L (ref 22–32)
Calcium: 8.3 mg/dL — ABNORMAL LOW (ref 8.9–10.3)
Creatinine, Ser: 0.76 mg/dL (ref 0.61–1.24)
Glucose, Bld: 113 mg/dL — ABNORMAL HIGH (ref 65–99)
POTASSIUM: 4.6 mmol/L (ref 3.5–5.1)
SODIUM: 132 mmol/L — AB (ref 135–145)

## 2015-01-01 LAB — PHOSPHORUS: Phosphorus: 3.1 mg/dL (ref 2.5–4.6)

## 2015-01-01 LAB — MAGNESIUM: MAGNESIUM: 1.6 mg/dL — AB (ref 1.7–2.4)

## 2015-01-01 MED ORDER — MAGNESIUM SULFATE IN D5W 10-5 MG/ML-% IV SOLN
1.0000 g | Freq: Once | INTRAVENOUS | Status: AC
  Administered 2015-01-01: 1 g via INTRAVENOUS
  Filled 2015-01-01: qty 100

## 2015-01-01 MED ORDER — HYPROMELLOSE (GONIOSCOPIC) 2.5 % OP SOLN: 1.0000 [drp] | Freq: Three times a day (TID) | OPHTHALMIC | Status: DC | PRN

## 2015-01-01 NOTE — Telephone Encounter (Signed)
I called and spoke with Juan [name].  Discussed my conversation with Mr. Hagee earlier today.  She has started to see a counselor to help care for herself while she cares for him.  I recommended she continue this and further recommended her to encourage Mr. Calliham to be seen at [facility], which he mentioned to me he was interested in attending.  Plan will be for her to pick him up today.  I will see them on an outpt basis.

## 2015-01-01 NOTE — Discharge Instructions (Signed)
You were hospitalized for alcohol withdrawal from 11/24 to 01/01/2015. It is very important that you become involved in an Alcohol Cessation program to help prevent further health problems and hospitalization. You can obtain more information about the [employer] [phone]. Your magnesium was also low during your hospitalization, so please continue taking Magnesium 500mg  daily. Please follow up with Dr. Gwendolyn Grant as scheduled.   Finding Treatment for Addiction WHAT IS ADDICTION? Addiction is a complex disease of the brain. It causes an uncontrollable (compulsive) need for a substance. You can be addicted to alcohol, illegal drugs, or prescription medicines such as painkillers. Addiction can also be a behavior, like gambling or shopping. The need for the drug or activity can become so strong that you think about it all the time. You can also become physically dependent on a substance. Addiction can change the way your brain works. Because of these changes, getting more of whatever you are addicted to becomes the most important thing to you and feels better than other activities or relationships. Addiction can lead to changes in health, behavior, emotions, relationships, and choices that affect you and everyone around you. HOW DO I KNOW IF I NEED TREATMENT FOR ADDICTION? Addiction is a progressive disease. Without treatment, addiction can get worse. Living with addiction puts you at higher risk for injury, poor health, lost employment, loss of money, and even death. You might need treatment for addiction if:  You have tried to stop or cut down, but you cannot.  Your addiction is causing physical health problems.  You find it annoying that your friends and family are concerned about your alcohol or substance use.  You feel guilty about substance abuse or a compulsive behavior.  You have lied or tried to hide your addiction.  You need a particular substance or activity to start  your day or to calm down.  You are getting in trouble at school, work, home, or with the police.  You have done something illegal to support your addiction.  You are running out of money because of your addiction.  You have no time for anything other than your addiction. WHAT TYPES OF TREATMENT ARE AVAILABLE? The treatment program that is right for you will depend on many factors, including the type of addiction you have. Treatment programs can be outpatient or inpatient. In an outpatient program, you live at home and go to work or school, but you also go to a clinic for treatment. With an inpatient program, you live and sleep at the program facility during treatment. After treatment, you might need a plan for support during recovery. Other treatment options include:   Medicine.  Some addictions may be treated with prescription medicines.  You might also need medicine to treat anxiety or depression.  Counseling and behavior therapy. Therapy can help individuals and families behave in healthier ways and relate more effectively.  Support groups. Confidential group therapy, such as a 12-step program, can help individuals and families during treatment and recovery. No single type of program is right for everyone. Many treatment programs involve a combination of education, counseling, and a 12-step, spiritually-based approach. Some treatment programs are government sponsored. They are geared for patients who do not have private insurance. Treatment programs can vary in many respects, such as:  Cost and types of insurance that are accepted.  Types of on-site medical services that are offered.  Length of stay, setting, and size.  Overall philosophy of treatment. WHAT SHOULD I CONSIDER WHEN  SELECTING A TREATMENT PROGRAM? It is important to think about your individual requirements when selecting a treatment program. There are a number of things to consider, such as:  If the program is  certified by the appropriate government agency. Even private programs must be certified and employ certified professionals.  If the program is covered by your insurance. If finances are a concern, the first call you should make is to your insurance company, if you have health insurance. Ask for a list of treatment programs that are in your network, and confirm any copayments and deductibles that you may have to pay.  If you do not have insurance, or if you choose to attend a program that does not accept your insurance, discuss whether a payment plan can be set up.  If treatment is available in languages other than English, if needed.  If the program offers detoxification treatment, if needed.  If 12-step meetings are held at the center or if transport is available for patients to attend meetings at other locations.  If the program is professional, organized, and clean.  If the program meets all of your needs, including physical and cultural needs.  If the facility offers specific treatment for your particular addiction.  If support continues to be offered after you have left the program.  If your treatment plan is continually looked at to make sure you are receiving the right treatment at the right time.  If mental health counseling is part of your treatment.  If medicine is included in treatment, if needed.  If your family is included in your treatment plan and if support is offered to them throughout the treatment process.  How the treatment works to prevent relapse. WHERE ELSE CAN I GET HELP?  Your health care provider. Ask him or her to help you find addiction treatment. These discussions are confidential.  The [employer] on Alcoholism and Drug Dependence (NCADD). This group has information about treatment centers and programs for people who have an addiction and for family members.  The telephone number is 1-800-NCA-CALL (914-422-2520).  The website is  https://ncadd.org/about-ncadd/our-affiliates  The Substance Abuse and Mental Health Services Administration [facility]). This group will help you find publicly funded treatment centers, help hotlines, and counseling services near you.  The telephone number is 1-800-662-HELP ((262)375-6678).  The website is www.findtreatment.[employer].pl In countries outside of the [place]. and [place], look in [employer] for contact information for services in your area.   This information is not intended to replace advice given to you by your health care provider. Make sure you discuss any questions you have with your health care provider.   Document Released: 12/20/2004 Document Revised: 10/12/2014 Document Reviewed: 11/09/2013 Elsevier Interactive Patient Education [employer]

## 2015-01-01 NOTE — Care Management (Cosign Needed)
CM met with patient at bedside patient is independent and being discharged home to self care.. Denies any HH services, or difficulty obtaining monthly medications Spouse will transport patient home via private vehicle. .No CM needs identified.

## 2015-01-01 NOTE — Progress Notes (Signed)
Family Medicine Teaching Service Daily Progress Note Intern Pager: [phone]  Patient name: Juan [name] Medical record number: [id number] Date of birth: 1950-05-10 Age: 64 y.o. Gender: male  Primary Care Provider: Renold Don, MD Consultants: None Code Status: Full  Pt Overview and Major Events to Date:  11/14: Admitted to FPTS for alcohol detox  Assessment and Plan: 64 y.o. male presenting with desire for alcohol detox. PMH is significant for alcoholism, COPD and afib.  Alcoholism:  - CIWA protocol plus scheduled klonopin  BID given heavy use. Continue to follow CIWA scores (9 >11>8>0>8>4>8>3>2). - MIVFs: NS @ 130ml/hr - Continue daily Folic acid, Thiamine, and Multivitamin. - Will watch K, Mag, and Phos carefully.   - K 4.6 this am  - Mag was 1.6 this am. 1g magnesium sulfate x 1.  - Phos 3.1 this am.  - List of rehab facilities per SW, patient responsible for setting up at discharge  Afib, stable:HR 80-84 overnight - Monitor on telemetry - Will continue home Diltiazem - Not anticoagulated due to significant fall risk, continue home ASA   FEN/GI: Heart healthy diet, continue NS@100mL /hr as Pt has only been eating/drinking a little. Prophylaxis: Lovenox Contact Precautions: MRSA positive by PCR  Disposition: Discharge today  Subjective:  Feeling much better and states he is ready to go home. Interested in looking into outpatient facilities. Refuses inpatient treatment. 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 Feels like he has the willpower to quit drinking and that he can quit at any time, however he acknowledges that is family is skeptical about him quitting. Denies pain.   Objective: Temp:  [98.1 F (36.7 C)-98.6 F (37 C)] 98.1 F (36.7 C) (11/27 0517) Pulse Rate:  [80-88] 84 (11/27 0517) Resp:  [18] 18 (11/27 0517) BP: (127-143)/(72-85) 143/85 mmHg (11/27 0517) SpO2:  [95 %-96  %] 96 % (11/27 0517) Weight:  [179 lb (81.194 kg)] 179 lb (81.194 kg) (11/27 0517) Physical Exam: General: 64yo male resting comfortably in no apparent distress, alert and answers questions appropriately. HEENT: /AT, MMM, poor dentition. Neck: supple Cardiovascular: regular rate and rhythm; no murmurs noted  Respiratory: no increased work of breathing, no crackles; no wheezing Abdomen: soft, NT, ND, ventral/suprapubic hernia not incarcerated, hepatomegaly  MSK: No edema, moves all 4 limbs spontaneously. Skin: Warm and dry  Neuro: Awake, alert, oriented x 3, no tremor noted Psych: normal speech, denies SI/HI.  Laboratory:  Recent Labs Lab 12/28/14 2106 12/29/14 0816 12/30/14 0249  WBC 9.4 12.7* 7.8  HGB 14.7 12.6* 10.7*  HCT 41.9 35.8* 32.3*  PLT 261 206 151    Recent Labs Lab 12/28/14 2300  12/30/14 1500 12/31/14 0425 01/01/15 0318  NA  --   < > 133* 132* 132*  K  --   < > 3.9 3.5 4.6  CL  --   < > 103 101 104  CO2  --   < > BUN  --   < > [id number]  CREATININE  --   < > 0.83 0.73 0.76  CALCIUM  --   < > 7.9* 8.0* 8.3*  PROT 5.9*  --   --   --   --   BILITOT 1.1  --   --   --   --   ALKPHOS 81  --   --   --   --   ALT 39  --   --   --   --  AST 46*  --   --   --   --   GLUCOSE  --   < > 134* 95 113*  < > = values in this interval not displayed. Lactic acid: 4.09 > 1.2 UA: No bacteria, trace Hgb, negative ketones, negative leukocytes, negative nitrites, no WBCs UDS: Positive for benzos I-stat troponin: 0.01 Salicylate level <4.0 Acetaminophen level <10 Ethyl Alcohol: 283  Imaging/Diagnostic Tests: CXR: Patchy areas of infiltrate, less apparent compared to recent studies. Somewhat spiculated area in the left base may represent focal pneumonia but could also represent a small neoplastic focus. Recommend f/u chest CT in 4-6 weeks to further evaluate.  [address] [place], [place] 01/01/2015, 7:24 AM PGY-2, [facility] FPTS Intern pager:  [phone], text pages welcome

## 2015-01-03 ENCOUNTER — Encounter: Payer: Self-pay | Admitting: Family Medicine

## 2015-01-03 ENCOUNTER — Ambulatory Visit (INDEPENDENT_AMBULATORY_CARE_PROVIDER_SITE_OTHER): Payer: Managed Care, Other (non HMO) | Admitting: Family Medicine

## 2015-01-03 VITALS — BP 116/67 | HR 93 | Temp 98.2°F | Ht 68.0 in | Wt 181.9 lb

## 2015-01-03 DIAGNOSIS — J329 Chronic sinusitis, unspecified: Secondary | ICD-10-CM

## 2015-01-03 DIAGNOSIS — I4891 Unspecified atrial fibrillation: Secondary | ICD-10-CM | POA: Diagnosis not present

## 2015-01-03 DIAGNOSIS — R911 Solitary pulmonary nodule: Secondary | ICD-10-CM | POA: Diagnosis not present

## 2015-01-03 DIAGNOSIS — E871 Hypo-osmolality and hyponatremia: Secondary | ICD-10-CM

## 2015-01-03 DIAGNOSIS — E876 Hypokalemia: Secondary | ICD-10-CM

## 2015-01-03 DIAGNOSIS — F1020 Alcohol dependence, uncomplicated: Secondary | ICD-10-CM

## 2015-01-03 LAB — CULTURE, BLOOD (ROUTINE X 2)
CULTURE: NO GROWTH
CULTURE: NO GROWTH

## 2015-01-03 MED ORDER — CLONAZEPAM 1 MG PO TABS: 1.0000 mg | ORAL_TABLET | Freq: Two times a day (BID) | ORAL | Status: DC | PRN

## 2015-01-03 NOTE — Assessment & Plan Note (Signed)
Major issue for today. Once again encouraged him to follow-up with outpatient rehabilitation services prior to any onset of cravings. He states his wife and he plan to discuss this this week. Goal is to be started by next week.

## 2015-01-03 NOTE — Assessment & Plan Note (Signed)
This is starting to flare back up. We'll hold on any antibiotics. He should start with daily nasal saline. Continuous Flonase. If this worsens we can send him to ENT. He has refused this in the past previously.

## 2015-01-03 NOTE — Progress Notes (Signed)
Subjective:    Juan [name] is a 64 y.o. male who presents to [facility] today for hospital FU:  1.  Hospital follow-up for EtOHism:  Juan [name] was recently readmitted for alcoholism.  He was detoxed inpatient and, as per his usual pattern, initially he agreed to inpatient rehabilitation. However as his inpatient stay progressed he switched to outpatient rehabilitation and then on the day of discharge said he wanted to get stronger and wait before starting outpatient rehabilitation.  Since leaving the hospital his weight is been improving. He has been eating better. Strength is coming back and he no longer needs to walk with a supportive cane. Most apparently he is not drinking. However he has not reached out to any outpatient rehabilitation services.  He does report nocturia 3-4 times a night. This is new in the past week or so. He also occasionally has dizziness. He has no history of recent falls. He has had some left shoulder pain since leaving the hospital which he attributes to sleeping on that side. No actual arm or shoulder weakness.  ROS as above per HPI, otherwise neg.  Pertinently, no chest pain, palpitations, SOB, Fever, Chills, Abd pain, N/V/D. he does report some rhinorrhea since leaving the hospital. He is continue to take his Flonase.  The following portions of the patient's history were reviewed and updated as appropriate: allergies, current medications, past medical history, family and social history, and problem list. Patient is a  Current everyday smoker.    PMH reviewed.  Past Medical History  Diagnosis Date  . CHF (congestive heart failure) (HCC) 10/2010    ECHO:  EF 40%, Grade II diastolic dysfunction  . Alcoholism (HCC)   . Allergy     seasonal   . Anxiety   . Depression   . Asthma   . Hyperlipidemia   . Hypertension   . Seizures (HCC)   . COPD (chronic obstructive pulmonary disease) (HCC)   . Headache    Past Surgical History  Procedure Laterality Date  .  Toenail excision Right 1967    Removal of ingrown toenail [Other]    Medications reviewed. Current Outpatient Prescriptions  Medication Sig Dispense Refill  . acetaminophen (TYLENOL) 325 MG tablet Take 2 tablets (650 mg total) by mouth every 6 (six) hours as needed for moderate pain. 30 tablet 0  . albuterol (PROVENTIL) (2.5 MG/3ML) 0.083% nebulizer solution USE 3 MLS BY NEBULIZATION EVERY 6 HOURS AS NEEDED FOR WHEEZING OR SHORTNESS OF BREATH 360 mL 1  . aspirin EC 81 MG EC tablet Take 1 tablet (81 mg total) by mouth daily. 90 tablet 0  . atorvastatin (LIPITOR) 40 MG tablet Take 1 tablet (40 mg total) by mouth daily at 6 PM. 30 tablet 6  . cholecalciferol (VITAMIN D) 1000 UNITS tablet Take 1,000 Units by mouth daily.    . clonazePAM (KLONOPIN) 1 MG tablet Take 1 tablet (1 mg total) by mouth 2 (two) times daily as needed for anxiety. 90 tablet 1  . COMBIVENT RESPIMAT 20-100 MCG/ACT AERS respimat INHALE TWO PUFFS BY MOUTH EVERY FOUR HOURS AS NEEDED FOR WHEEZING 1 Inhaler 3  . diltiazem (CARDIZEM CD) 180 MG 24 hr capsule Take 1 capsule (180 mg total) by mouth daily. 30 capsule 0  . DULoxetine (CYMBALTA) 30 MG capsule TAKE ONE CAPSULE BY MOUTH ONE TIME DAILY 30 capsule 3  . fluticasone (FLONASE) 50 MCG/ACT nasal spray Use two sprays in each nostril daily 16 g 11  . hydroxypropyl methylcellulose / hypromellose (  ISOPTO TEARS / GONIOVISC) 2.5 % ophthalmic solution Place 1 drop into both eyes 3 (three) times daily as needed for dry eyes. 15 mL 0  . lisinopril (PRINIVIL,ZESTRIL) 5 MG tablet TAKE 1 TABLET BY MOUTH EVERY DAY 30 tablet 3  . Magnesium 500 MG TABS Take 500 mg by mouth daily.    . montelukast (SINGULAIR) 10 MG tablet TAKE ONE TABLET BY MOUTH NIGHTLY AT BEDTIME 30 tablet 3  . Multiple Vitamin (MULTIVITAMIN WITH MINERALS) TABS tablet Take 1 tablet by mouth daily.    Marland Kitchen tiotropium (SPIRIVA) 18 MCG inhalation capsule Place 1 capsule (18 mcg total) into inhaler and inhale daily. 30 capsule 6  .  [DISCONTINUED] albuterol (PROVENTIL,VENTOLIN) 90 MCG/ACT inhaler Inhale 2 puffs into the lungs every 6 (six) hours as needed for wheezing. 17 g 0   No current facility-administered medications for this visit.     Objective:   Physical Exam BP 116/67 mmHg  Pulse 93  Temp(Src) 98.2 F (36.8 C) (Oral)  Ht  (1.727 m)  Wt 181 lb 14.4 oz (82.509 kg)  BMI 27.66 kg/m2 Gen:  Alert, cooperative patient who appears stated age in no acute distress.  Vital signs reviewed. Kyphosis noted HEENT: EOMI,  MMM.  Poor dental hygiene Cardiac:  Regular rate and rhythm with grade 2 murmur noted. Pulm:  Clear to auscultation  Abd:  Soft/nondistended/nontender.  Good bowel sounds throughout all four quadrants.  No masses noted.  Exts: No lower extremity edema.

## 2015-01-03 NOTE — Assessment & Plan Note (Signed)
Rechecking be met today.

## 2015-01-03 NOTE — Assessment & Plan Note (Signed)
No current elevations or other symptoms. Does have some dizziness and we are checking for orthostasis today. Continue Cardizem. Heart rate is normal currently.

## 2015-01-03 NOTE — Patient Instructions (Signed)
It was good to see you again today.   Come back and see me before Christmas.  Refill for Klonopin today.  The most important thing is getting in to see Juan [name] before any cravings or concerns arise.

## 2015-01-04 LAB — MAGNESIUM: MAGNESIUM: 1.8 mg/dL (ref 1.5–2.5)

## 2015-01-04 LAB — BASIC METABOLIC PANEL
BUN: 11 mg/dL (ref 7–25)
CHLORIDE: 96 mmol/L — AB (ref 98–110)
CO2: 25 mmol/L (ref 20–31)
Calcium: 9.1 mg/dL (ref 8.6–10.3)
Creat: 0.82 mg/dL (ref 0.70–1.25)
Glucose, Bld: 99 mg/dL (ref 65–99)
POTASSIUM: 5 mmol/L (ref 3.5–5.3)
SODIUM: 133 mmol/L — AB (ref 135–146)

## 2015-01-06 ENCOUNTER — Other Ambulatory Visit: Payer: Self-pay | Admitting: Family Medicine

## 2015-01-24 ENCOUNTER — Ambulatory Visit: Payer: Self-pay | Admitting: Family Medicine

## 2015-01-28 ENCOUNTER — Other Ambulatory Visit: Payer: Self-pay | Admitting: Family Medicine

## 2015-01-31 ENCOUNTER — Telehealth: Payer: Self-pay | Admitting: Family Medicine

## 2015-01-31 NOTE — Telephone Encounter (Signed)
Pt wife called to report that pt is starting to suffer from alcohol withdrawal symptoms. Pt spoke to Dorisann Frames, RN to ask if she should get him more alcohol to "keep him from dying", Tamika advised her not to do so as it feeds into the problem at hand. Pt wife explains that the pt will refuse to go to the hospital, Tamika suggested her call 911 so that someone can take a look at the pt considering he has had seizures in the past. Dorothey Baseman, ASA

## 2015-01-31 NOTE — Telephone Encounter (Signed)
Late note:  Called and spoke with both Mrs. And Mr. Greeney at 5 pm.  Per wife, he last had drink about 24 hours ago and was beginning to have nausea, tremors, and an irregular pulse when she checked.  He was begging her to go by him more alcohol as he was too weak to drive.  After speaking with [name], I spoke with Mr. Knetter.  He downplayed his symptoms and said he was "feeling bad" before stopping drinking.  I strongly encouraged him to be evaluated for DT's/withdrawal symptoms tonight, especially with the onset of an irregular heartbeat.  He handed the phone back to his wife without making a decision.  I told her to call EMS rather than take him to the ED in case he had an arrhythmia problem or began seizing.  She said she would hang up the phone and call them.  She expressed apprecation for the call back.

## 2015-01-31 NOTE — Telephone Encounter (Signed)
Per Mrs. Simeon Craft, she checked patient's heart rate is is beating fast and ships a beat.  Advised her if that is concerning to call EMS for someone to take a look at him.  Mrs. Wisely requested that message be sent to Dr. Gwendolyn Grant and to give them a call.  Last drink patient had was 6 PM 01/30/15.  Juan Pu, RN

## 2015-02-14 ENCOUNTER — Other Ambulatory Visit: Payer: Self-pay | Admitting: Family Medicine

## 2015-05-08 ENCOUNTER — Other Ambulatory Visit: Payer: Self-pay | Admitting: Family Medicine

## 2015-05-15 ENCOUNTER — Other Ambulatory Visit: Payer: Self-pay | Admitting: Family Medicine

## 2015-06-16 ENCOUNTER — Other Ambulatory Visit: Payer: Self-pay | Admitting: Family Medicine

## 2015-06-18 ENCOUNTER — Other Ambulatory Visit: Payer: Self-pay | Admitting: Family Medicine

## 2015-08-17 ENCOUNTER — Other Ambulatory Visit: Payer: Self-pay | Admitting: Family Medicine

## 2015-09-18 ENCOUNTER — Other Ambulatory Visit: Payer: Self-pay | Admitting: Family Medicine

## 2015-10-04 ENCOUNTER — Other Ambulatory Visit: Payer: Self-pay | Admitting: Family Medicine

## 2015-11-13 ENCOUNTER — Other Ambulatory Visit: Payer: Self-pay | Admitting: Family Medicine

## 2015-12-01 ENCOUNTER — Other Ambulatory Visit: Payer: Self-pay | Admitting: Family Medicine

## 2015-12-01 NOTE — Telephone Encounter (Signed)
Pt needs refills on combivent and albuterol solution. Pt uses [facility] on [address]. Pt would like to get these filled before the weekend. Please advise. Thanks! ep

## 2015-12-05 ENCOUNTER — Other Ambulatory Visit: Payer: Self-pay | Admitting: Family Medicine

## 2015-12-05 NOTE — Telephone Encounter (Signed)
Pt was advised. Appointment scheduled for Nov. 14th. Thanks! ep

## 2015-12-05 NOTE — Telephone Encounter (Signed)
LMOVM for pt to return call.  Will need an appt before refills will be given.  Please inform of this when he calls back. Shahira Fiske, Maryjo Rochester, CMA

## 2015-12-14 ENCOUNTER — Emergency Department (HOSPITAL_COMMUNITY): Payer: Managed Care, Other (non HMO)

## 2015-12-14 ENCOUNTER — Inpatient Hospital Stay (HOSPITAL_COMMUNITY)
Admission: EM | Admit: 2015-12-14 | Discharge: 2015-12-19 | DRG: 208 | Disposition: A | Discharge status: Home / Self Care | Payer: Managed Care, Other (non HMO) | Attending: Family Medicine | Admitting: Family Medicine

## 2015-12-14 ENCOUNTER — Encounter (HOSPITAL_COMMUNITY): Payer: Self-pay

## 2015-12-14 DIAGNOSIS — F1020 Alcohol dependence, uncomplicated: Secondary | ICD-10-CM | POA: Diagnosis present

## 2015-12-14 DIAGNOSIS — E871 Hypo-osmolality and hyponatremia: Secondary | ICD-10-CM

## 2015-12-14 DIAGNOSIS — Z8249 Family history of ischemic heart disease and other diseases of the circulatory system: Secondary | ICD-10-CM

## 2015-12-14 DIAGNOSIS — I452 Bifascicular block: Secondary | ICD-10-CM | POA: Diagnosis present

## 2015-12-14 DIAGNOSIS — Z7982 Long term (current) use of aspirin: Secondary | ICD-10-CM

## 2015-12-14 DIAGNOSIS — T41295A Adverse effect of other general anesthetics, initial encounter: Secondary | ICD-10-CM | POA: Diagnosis not present

## 2015-12-14 DIAGNOSIS — I248 Other forms of acute ischemic heart disease: Secondary | ICD-10-CM | POA: Diagnosis present

## 2015-12-14 DIAGNOSIS — I4891 Unspecified atrial fibrillation: Secondary | ICD-10-CM | POA: Diagnosis present

## 2015-12-14 DIAGNOSIS — Z9119 Patient's noncompliance with other medical treatment and regimen: Secondary | ICD-10-CM

## 2015-12-14 DIAGNOSIS — J189 Pneumonia, unspecified organism: Secondary | ICD-10-CM

## 2015-12-14 DIAGNOSIS — G9340 Encephalopathy, unspecified: Secondary | ICD-10-CM | POA: Diagnosis present

## 2015-12-14 DIAGNOSIS — G9341 Metabolic encephalopathy: Secondary | ICD-10-CM | POA: Diagnosis present

## 2015-12-14 DIAGNOSIS — F17210 Nicotine dependence, cigarettes, uncomplicated: Secondary | ICD-10-CM | POA: Diagnosis present

## 2015-12-14 DIAGNOSIS — E440 Moderate protein-calorie malnutrition: Secondary | ICD-10-CM | POA: Diagnosis present

## 2015-12-14 DIAGNOSIS — R1310 Dysphagia, unspecified: Secondary | ICD-10-CM | POA: Diagnosis present

## 2015-12-14 DIAGNOSIS — F329 Major depressive disorder, single episode, unspecified: Secondary | ICD-10-CM | POA: Diagnosis present

## 2015-12-14 DIAGNOSIS — I110 Hypertensive heart disease with heart failure: Secondary | ICD-10-CM | POA: Diagnosis present

## 2015-12-14 DIAGNOSIS — E785 Hyperlipidemia, unspecified: Secondary | ICD-10-CM | POA: Diagnosis present

## 2015-12-14 DIAGNOSIS — I952 Hypotension due to drugs: Secondary | ICD-10-CM | POA: Diagnosis not present

## 2015-12-14 DIAGNOSIS — J441 Chronic obstructive pulmonary disease with (acute) exacerbation: Secondary | ICD-10-CM

## 2015-12-14 DIAGNOSIS — Z888 Allergy status to other drugs, medicaments and biological substances status: Secondary | ICD-10-CM

## 2015-12-14 DIAGNOSIS — J9622 Acute and chronic respiratory failure with hypercapnia: Secondary | ICD-10-CM | POA: Diagnosis present

## 2015-12-14 DIAGNOSIS — I5032 Chronic diastolic (congestive) heart failure: Secondary | ICD-10-CM | POA: Diagnosis present

## 2015-12-14 DIAGNOSIS — Z6823 Body mass index (BMI) 23.0-23.9, adult: Secondary | ICD-10-CM

## 2015-12-14 DIAGNOSIS — Z79899 Other long term (current) drug therapy: Secondary | ICD-10-CM

## 2015-12-14 DIAGNOSIS — F419 Anxiety disorder, unspecified: Secondary | ICD-10-CM | POA: Diagnosis present

## 2015-12-14 DIAGNOSIS — J9621 Acute and chronic respiratory failure with hypoxia: Secondary | ICD-10-CM | POA: Diagnosis present

## 2015-12-14 DIAGNOSIS — J9601 Acute respiratory failure with hypoxia: Secondary | ICD-10-CM

## 2015-12-14 DIAGNOSIS — J9690 Respiratory failure, unspecified, unspecified whether with hypoxia or hypercapnia: Secondary | ICD-10-CM | POA: Diagnosis present

## 2015-12-14 LAB — CBC WITH DIFFERENTIAL/PLATELET
BASOS ABS: 0 10*3/uL (ref 0.0–0.1)
Basophils Relative: 0 %
Eosinophils Absolute: 0.1 10*3/uL (ref 0.0–0.7)
Eosinophils Relative: 1 %
HEMATOCRIT: 42.8 % (ref 39.0–52.0)
Hemoglobin: 15.7 g/dL (ref 13.0–17.0)
LYMPHS ABS: 1.3 10*3/uL (ref 0.7–4.0)
Lymphocytes Relative: 10 %
MCH: 30.4 pg (ref 26.0–34.0)
MCHC: 36.7 g/dL — AB (ref 30.0–36.0)
MCV: 82.8 fL (ref 78.0–100.0)
MONO ABS: 0.3 10*3/uL (ref 0.1–1.0)
Monocytes Relative: 2 %
NEUTROS ABS: 10.9 10*3/uL — AB (ref 1.7–7.7)
Neutrophils Relative %: 87 %
PLATELETS: 269 10*3/uL (ref 150–400)
RBC: 5.17 MIL/uL (ref 4.22–5.81)
RDW: 12.9 % (ref 11.5–15.5)
WBC: 12.6 10*3/uL — AB (ref 4.0–10.5)

## 2015-12-14 LAB — COMPREHENSIVE METABOLIC PANEL
ALBUMIN: 4.3 g/dL (ref 3.5–5.0)
ALT: 14 U/L — ABNORMAL LOW (ref 17–63)
ANION GAP: 14 (ref 5–15)
AST: 25 U/L (ref 15–41)
Alkaline Phosphatase: 63 U/L (ref 38–126)
BILIRUBIN TOTAL: 1 mg/dL (ref 0.3–1.2)
BUN: 8 mg/dL (ref 6–20)
CHLORIDE: 84 mmol/L — AB (ref 101–111)
CO2: 21 mmol/L — ABNORMAL LOW (ref 22–32)
Calcium: 9.9 mg/dL (ref 8.9–10.3)
Creatinine, Ser: 0.73 mg/dL (ref 0.61–1.24)
GFR calc Af Amer: >60 mL/min (ref >60)
GLUCOSE: 159 mg/dL — AB (ref 65–99)
POTASSIUM: 4.5 mmol/L (ref 3.5–5.1)
Sodium: 119 mmol/L — CL (ref 135–145)
TOTAL PROTEIN: 8.2 g/dL — AB (ref 6.5–8.1)

## 2015-12-14 LAB — I-STAT ARTERIAL BLOOD GAS, ED
Acid-Base Excess: 2 mmol/L (ref 0.0–2.0)
BICARBONATE: 28.1 mmol/L — AB (ref 20.0–28.0)
O2 SAT: 99 %
PCO2 ART: 48.6 mmHg — AB (ref 32.0–48.0)
PO2 ART: 121 mmHg — AB (ref 83.0–108.0)
Patient temperature: 98.6
TCO2: 30 mmol/L (ref 0–100)
pH, Arterial: 7.371 (ref 7.350–7.450)

## 2015-12-14 LAB — I-STAT TROPONIN, ED: TROPONIN I, POC: 0.08 ng/mL (ref 0.00–0.08)

## 2015-12-14 MED ORDER — TIOTROPIUM BROMIDE MONOHYDRATE 18 MCG IN CAPS: 18.0000 ug | ORAL_CAPSULE | Freq: Every day | RESPIRATORY_TRACT | 6 refills | Status: DC

## 2015-12-14 MED ORDER — IPRATROPIUM-ALBUTEROL 20-100 MCG/ACT IN AERS: INHALATION_SPRAY | RESPIRATORY_TRACT | 0 refills | Status: DC

## 2015-12-14 MED ORDER — PIPERACILLIN-TAZOBACTAM 3.375 G IVPB 30 MIN
3.3750 g | Freq: Once | INTRAVENOUS | Status: AC
  Administered 2015-12-15: 3.375 g via INTRAVENOUS
  Filled 2015-12-14: qty 50

## 2015-12-14 MED ORDER — ALBUTEROL (5 MG/ML) CONTINUOUS INHALATION SOLN
15.0000 mg/h | INHALATION_SOLUTION | Freq: Once | RESPIRATORY_TRACT | Status: AC
  Administered 2015-12-14: 15 mg/h via RESPIRATORY_TRACT
  Filled 2015-12-14: qty 20

## 2015-12-14 MED ORDER — SODIUM CHLORIDE 0.9 % IV BOLUS (SEPSIS)
500.0000 mL | Freq: Once | INTRAVENOUS | Status: AC
  Administered 2015-12-15: 500 mL via INTRAVENOUS

## 2015-12-14 MED ORDER — SODIUM CHLORIDE 0.9 % IV BOLUS (SEPSIS)
1000.0000 mL | Freq: Once | INTRAVENOUS | Status: AC
  Administered 2015-12-15: 1000 mL via INTRAVENOUS

## 2015-12-14 MED ORDER — METHYLPREDNISOLONE SODIUM SUCC 125 MG IJ SOLR
125.0000 mg | Freq: Once | INTRAMUSCULAR | Status: AC
  Administered 2015-12-15: 125 mg via INTRAVENOUS
  Filled 2015-12-14: qty 2

## 2015-12-14 MED ORDER — VANCOMYCIN HCL IN DEXTROSE 1-5 GM/200ML-% IV SOLN
1000.0000 mg | Freq: Once | INTRAVENOUS | Status: AC
  Administered 2015-12-15: 1000 mg via INTRAVENOUS
  Filled 2015-12-14: qty 200

## 2015-12-14 MED ORDER — IPRATROPIUM BROMIDE 0.02 % IN SOLN
1.0000 mg | Freq: Once | RESPIRATORY_TRACT | Status: AC
  Administered 2015-12-14: 1 mg via RESPIRATORY_TRACT
  Filled 2015-12-14: qty 5

## 2015-12-14 NOTE — Telephone Encounter (Signed)
Contacted pt and let him know that the Rx had been sent to the pharmacy. Juan [name], [name], [place]

## 2015-12-14 NOTE — Telephone Encounter (Signed)
Done

## 2015-12-14 NOTE — Telephone Encounter (Signed)
Wife called. Pt is having trouble breathing. The refills were denied previously without an appt. Pt has an appt 12-19-15.  Wife wants to know if dr Gwendolyn Grant would refill the inhalers since an appt has been made. Please advise

## 2015-12-14 NOTE — Addendum Note (Signed)
Addended by: Lamonte Sakai, [name] on: 12/14/2015 02:54 PM   Modules accepted: Orders

## 2015-12-14 NOTE — Addendum Note (Signed)
Addended byGwendolyn Grant, Damaso Laday H on: 12/14/2015 03:52 PM   Modules accepted: Orders

## 2015-12-14 NOTE — ED Provider Notes (Signed)
By signing my name below, I, Suzan Slick. Elon Spanner, attest that this documentation has been prepared under the direction and in the presence of Diane Hanel N Tonni Mansour, DO.  Electronically Signed: Suzan Slick. Elon Spanner, ED Scribe. 12/14/15. 11:33 PM.    TIME SEEN: 11:11 PM   CHIEF COMPLAINT:  Chief Complaint  Patient presents with  . Shortness of Breath     HPI:  HPI Comments: Juan [name] is a 65 y.o. male with a PMHx of A-Fib Not on anticoagulation, asthma, COPD, HTN, and CHF (ejection fraction was 50-55% in 2016) who presents to the Emergency Department complaining of intermittent, worsening shortness of breath x 3 weeks. However, pt states shortness of breath has become more constant within the last few days. He also reports associated productive cough and "straining from coughing". No aggravating or alleviating factors reported. Denies any pain at this time. No recent fever, chills, or chest pain. He does not wear oxygen at home. Pt admits he has gone a few days without his daily medications as he ran out. He is not currently on any anticoagulants.   Pt states he was recently sick with URI symptoms and states he feels he never completely improved from symptoms.   PCP: Renold Don, MD     ROS: See HPI Constitutional: no fever  Eyes: no drainage  ENT: no runny nose   Cardiovascular:  no chest pain  Resp: SOB and cough GI: no vomiting GU: no dysuria Integumentary: no rash  Allergy: no hives  Musculoskeletal: no leg swelling  Neurological: no slurred speech ROS otherwise negative  PAST MEDICAL HISTORY/PAST SURGICAL HISTORY:  Past Medical History:  Diagnosis Date  . Alcoholism (HCC)   . Allergy    seasonal   . Anxiety   . Asthma   . CHF (congestive heart failure) (HCC) 10/2010   ECHO:  EF 40%, Grade II diastolic dysfunction  . COPD (chronic obstructive pulmonary disease) (HCC)   . Depression   . Headache   . Hyperlipidemia   . Hypertension   . Seizures (HCC)     MEDICATIONS:   Prior to Admission medications   Medication Sig Start Date End Date Taking? Authorizing Provider  acetaminophen (TYLENOL) 325 MG tablet Take 2 tablets (650 mg total) by mouth every 6 (six) hours as needed for moderate pain. 11/21/14   Jeralyn Bennett, MD  albuterol (PROVENTIL) (2.5 MG/3ML) 0.083% nebulizer solution INHALE 1 VIAL VIA NEBULIZER EVERY 6 HOURS AS NEEDED FOR WHEEZING OR SHORTNESS OF BREATH 09/18/15   Tobey Grim, MD  aspirin EC 81 MG EC tablet Take 1 tablet (81 mg total) by mouth daily. 11/13/12   Renae Fickle, MD  atorvastatin (LIPITOR) 40 MG tablet Take 1 tablet (40 mg total) by mouth daily at 6 PM. 03/17/14   Chrystie Nose, MD  cholecalciferol (VITAMIN D) 1000 UNITS tablet Take 1,000 Units by mouth daily.    Historical Provider, MD  clonazePAM (KLONOPIN) 0.5 MG tablet TAKE 1 TAB BY MOUTH TWICE DAILY AS NEEDED FOR ANXIETY 06/16/15   Tobey Grim, MD  clonazePAM (KLONOPIN) 1 MG tablet Take 1 tablet (1 mg total) by mouth 2 (two) times daily as needed for anxiety. 01/03/15   Tobey Grim, MD  diltiazem (CARDIZEM CD) 180 MG 24 hr capsule Take 1 capsule (180 mg total) by mouth daily. 12/18/14   Tyrone Nine, MD  DULoxetine (CYMBALTA) 30 MG capsule TAKE ONE CAPSULE BY MOUTH ONE TIME DAILY 07/27/14   Uvaldo Rising, MD  fluticasone (FLONASE) 50 MCG/ACT nasal spray Use two sprays in each nostril daily 11/08/13   Tobey GrimJeffrey H Walden, MD  hydroxypropyl methylcellulose / hypromellose (ISOPTO TEARS / GONIOVISC) 2.5 % ophthalmic solution Place 1 drop into both eyes 3 (three) times daily as needed for dry eyes. 01/01/15   Harrell N Rumley, DO  Ipratropium-Albuterol (COMBIVENT RESPIMAT) 20-100 MCG/ACT AERS respimat INHALE TWO PUFFS BY MOUTH EVERY FOUR HOURS AS NEEDED FOR WHEEZING 12/14/15   Tobey GrimJeffrey H Walden, MD  lisinopril (PRINIVIL,ZESTRIL) 5 MG tablet TAKE 1 TABLET BY MOUTH EVERY DAY 11/30/14   Tobey GrimJeffrey H Walden, MD  Magnesium 500 MG TABS Take 500 mg by mouth daily.    Historical Provider,  MD  montelukast (SINGULAIR) 10 MG tablet TAKE ONE TABLET BY MOUTH NIGHTLY AT BEDTIME 10/17/14   Tobey GrimJeffrey H Walden, MD  Multiple Vitamin (MULTIVITAMIN WITH MINERALS) TABS tablet Take 1 tablet by mouth daily.    Historical Provider, MD  tiotropium (SPIRIVA) 18 MCG inhalation capsule Place 1 capsule (18 mcg total) into inhaler and inhale daily. 12/14/15   Tobey GrimJeffrey H Walden, MD    ALLERGIES:  Allergies  Allergen Reactions  . Coreg [Carvedilol] Shortness Of Breath    SOCIAL HISTORY:  Social History  Substance Use Topics  . Smoking status: Current Every Day Smoker    Packs/day: 1.50    Years: 40.00    Types: Cigarettes  . Smokeless tobacco: Never Used     Comment: a little more than 1 ppd (02/17/14)  . Alcohol use Yes     Comment: 1/2 galloon liquior in 2 days     12/28/2014 wife reports 32oz / day    FAMILY HISTORY: Family History  Problem Relation Age of Onset  . Alzheimer's disease Mother   . Diabetes Mother   . Heart disease Maternal Uncle     EXAM: BP (!) 161/112 (BP Location: Left Arm)   Pulse 117   Temp 98.2 F (36.8 C) (Oral)   Resp (!) 40   SpO2 94%  CONSTITUTIONAL: Alert and oriented and responds appropriately to questions. Chronically ill appearing; in moderate respiratory distress HEAD: Normocephalic EYES: Conjunctivae clear, PERRL, EOMI ENT: normal nose; no rhinorrhea; moist mucous membranes NECK: Supple, no meningismus, no nuchal rigidity, no LAD  CARD: Regular and tachycardic; S1 and S2 appreciated; no murmurs, no clicks, no rubs, no gallops RESP: tachypneic and diminished aeration at bases bilaterally, diffuse expiatory wheezes, no rhonchi or rales, speaking in short sentences, moderate respiratory distress ABD/GI: Normal bowel sounds; non-distended; soft, non-tender, no rebound, no guarding, no peritoneal signs, no hepatosplenomegaly BACK:  The back appears normal and is non-tender to palpation, there is no CVA tenderness EXT: Normal ROM in all joints;  non-tender to palpation; no edema; normal capillary refill; no cyanosis, no calf tenderness or swelling    SKIN: Normal color for age and race; warm; no rash NEURO: Moves all extremities equally, sensation to light touch intact diffusely, cranial nerves II through XII intact, normal speech PSYCH: The patient's mood and manner are appropriate. Grooming and personal hygiene are appropriate.  MEDICAL DECISION MAKING: Patient here with what appears to be COPD exacerbation. He does not appear volume overloaded on exam and had an EF 50-55% last year. Differential also includes pneumonia. We'll obtain labs, chest x-ray, ABG. We'll give albuterol, Atrovent, Solu-Medrol.  ED PROGRESS: Patient's chest x-ray appears to have bilateral infiltrates more consistent with pneumonia. Concern now for sepsis. Will obtain rectal temperature. We'll give IV fluids, broad-spectrum antibiotics. His ABG is reassuring.  Patient will need admission. His PCP is Dr. Gwendolyn Grant with family medicine.   12:50 AM  Pt's mentation appeared to be decreasing and his respiratory status worsening. Decision was made to intubate patient. I did not feel BiPAP was a good option given he appeared to have bilateral pneumonia on his chest x-ray. Wife agreed to this plan. Intubation performed without difficulty.  Patient's IV fluids were slowed down even he is extremely hyponatremic and there was a change in mental status. He has received 1 L IV fluids. Will obtain a CT of his head to ensure there is no sign of edema although change in mental status is likely because of his changing respiratory status. We'll also obtain a CT of his chest for further evaluation. Discussed with Dr. Arsenio Loader with critical care. They will see the patient for admission.  1:23 AM  ICU resident at bedside.  They will assume care.   EKG Interpretation  Date/Time:  Thursday December 14 2015 23:40:53 EST Ventricular Rate:  115 PR Interval:    QRS Duration: 154 QT  Interval:  358 QTC Calculation: 496 R Axis:   -102 Text Interpretation:  Sinus tachycardia RBBB and LAFB No significant change since last tracing Confirmed by Pragya Lofaso,  DO, Tonji Elliff ([id number]) on 12/14/2015 11:56:39 PM        CRITICAL CARE Performed by: Raelyn Number   Total critical care time: 45 minutes  Critical care time was exclusive of separately billable procedures and treating other patients.  Critical care was necessary to treat or prevent imminent or life-threatening deterioration.  Critical care was time spent personally by me on the following activities: development of treatment plan with patient and/or surrogate as well as nursing, discussions with consultants, evaluation of patient's response to treatment, examination of patient, obtaining history from patient or surrogate, ordering and performing treatments and interventions, ordering and review of laboratory studies, ordering and review of radiographic studies, pulse oximetry and re-evaluation of patient's condition.   INTUBATION Performed by: Raelyn Number  Required items: required blood products, implants, devices, and special equipment available Patient identity confirmed: provided demographic data and hospital-assigned identification number Time out: Immediately prior to procedure a "time out" was called to verify the correct patient, procedure, equipment, support staff and site/side marked as required.  Indications: Respiratory failure   Intubation method: Glidescope Laryngoscopy   Preoxygenation: BVM  Sedatives: 30 mg IV Etomidate Paralytic: 100 mg IV Succinylcholine  Tube Size: 7.5 cuffed  Post-procedure assessment: chest rise and ETCO2 monitor Breath sounds: equal and absent over the epigastrium Tube secured with: ETT holder Chest x-ray interpreted by radiologist and me.  Chest x-ray findings: endotracheal tube in appropriate position  Patient tolerated the procedure well with no immediate  complications.     I personally performed the services described in this documentation, which was scribed in my presence. The recorded information has been reviewed and is accurate.    Layla Maw Yolanda Dockendorf, DO 12/15/15 [phone]

## 2015-12-14 NOTE — ED Triage Notes (Signed)
Pt here with c/o SOB and HTN. BP: 171/106 at home. This started about 3 weeks ago but tonight his wife noticed more labored breathing. He denies chest pain. RR at triage about 40 and labored with accessory muscle use. 02 91% on RA, HR-121 and BP 161/112.

## 2015-12-15 ENCOUNTER — Emergency Department (HOSPITAL_COMMUNITY): Payer: Managed Care, Other (non HMO)

## 2015-12-15 DIAGNOSIS — R0600 Dyspnea, unspecified: Secondary | ICD-10-CM | POA: Diagnosis not present

## 2015-12-15 DIAGNOSIS — I4891 Unspecified atrial fibrillation: Secondary | ICD-10-CM | POA: Diagnosis present

## 2015-12-15 DIAGNOSIS — I952 Hypotension due to drugs: Secondary | ICD-10-CM | POA: Diagnosis not present

## 2015-12-15 DIAGNOSIS — I248 Other forms of acute ischemic heart disease: Secondary | ICD-10-CM | POA: Diagnosis present

## 2015-12-15 DIAGNOSIS — J441 Chronic obstructive pulmonary disease with (acute) exacerbation: Principal | ICD-10-CM

## 2015-12-15 DIAGNOSIS — F1010 Alcohol abuse, uncomplicated: Secondary | ICD-10-CM | POA: Diagnosis not present

## 2015-12-15 DIAGNOSIS — J9621 Acute and chronic respiratory failure with hypoxia: Secondary | ICD-10-CM | POA: Diagnosis present

## 2015-12-15 DIAGNOSIS — J9622 Acute and chronic respiratory failure with hypercapnia: Secondary | ICD-10-CM | POA: Diagnosis present

## 2015-12-15 DIAGNOSIS — J9601 Acute respiratory failure with hypoxia: Secondary | ICD-10-CM | POA: Diagnosis not present

## 2015-12-15 DIAGNOSIS — Z7982 Long term (current) use of aspirin: Secondary | ICD-10-CM | POA: Diagnosis not present

## 2015-12-15 DIAGNOSIS — E785 Hyperlipidemia, unspecified: Secondary | ICD-10-CM | POA: Diagnosis present

## 2015-12-15 DIAGNOSIS — J9690 Respiratory failure, unspecified, unspecified whether with hypoxia or hypercapnia: Secondary | ICD-10-CM | POA: Diagnosis present

## 2015-12-15 DIAGNOSIS — Z8249 Family history of ischemic heart disease and other diseases of the circulatory system: Secondary | ICD-10-CM | POA: Diagnosis not present

## 2015-12-15 DIAGNOSIS — Z79899 Other long term (current) drug therapy: Secondary | ICD-10-CM | POA: Diagnosis not present

## 2015-12-15 DIAGNOSIS — R29898 Other symptoms and signs involving the musculoskeletal system: Secondary | ICD-10-CM | POA: Diagnosis not present

## 2015-12-15 DIAGNOSIS — F329 Major depressive disorder, single episode, unspecified: Secondary | ICD-10-CM | POA: Diagnosis present

## 2015-12-15 DIAGNOSIS — E871 Hypo-osmolality and hyponatremia: Secondary | ICD-10-CM

## 2015-12-15 DIAGNOSIS — Z888 Allergy status to other drugs, medicaments and biological substances status: Secondary | ICD-10-CM | POA: Diagnosis not present

## 2015-12-15 DIAGNOSIS — G9341 Metabolic encephalopathy: Secondary | ICD-10-CM | POA: Diagnosis present

## 2015-12-15 DIAGNOSIS — J189 Pneumonia, unspecified organism: Secondary | ICD-10-CM | POA: Diagnosis not present

## 2015-12-15 DIAGNOSIS — G9340 Encephalopathy, unspecified: Secondary | ICD-10-CM | POA: Diagnosis present

## 2015-12-15 DIAGNOSIS — E440 Moderate protein-calorie malnutrition: Secondary | ICD-10-CM | POA: Diagnosis present

## 2015-12-15 DIAGNOSIS — I452 Bifascicular block: Secondary | ICD-10-CM | POA: Diagnosis present

## 2015-12-15 DIAGNOSIS — F17210 Nicotine dependence, cigarettes, uncomplicated: Secondary | ICD-10-CM | POA: Diagnosis present

## 2015-12-15 DIAGNOSIS — I5032 Chronic diastolic (congestive) heart failure: Secondary | ICD-10-CM | POA: Diagnosis present

## 2015-12-15 DIAGNOSIS — F1020 Alcohol dependence, uncomplicated: Secondary | ICD-10-CM | POA: Diagnosis present

## 2015-12-15 DIAGNOSIS — I110 Hypertensive heart disease with heart failure: Secondary | ICD-10-CM | POA: Diagnosis present

## 2015-12-15 DIAGNOSIS — F419 Anxiety disorder, unspecified: Secondary | ICD-10-CM | POA: Diagnosis present

## 2015-12-15 DIAGNOSIS — R1310 Dysphagia, unspecified: Secondary | ICD-10-CM | POA: Diagnosis present

## 2015-12-15 LAB — URINALYSIS, ROUTINE W REFLEX MICROSCOPIC
Bilirubin Urine: NEGATIVE
Glucose, UA: NEGATIVE mg/dL
Ketones, ur: NEGATIVE mg/dL
Leukocytes, UA: NEGATIVE
Nitrite: NEGATIVE
Protein, ur: 100 mg/dL — AB
Specific Gravity, Urine: 1.02 (ref 1.005–1.030)
pH: 6.5 (ref 5.0–8.0)

## 2015-12-15 LAB — CBC
HCT: 34.8 % — ABNORMAL LOW (ref 39.0–52.0)
HCT: 37.5 % — ABNORMAL LOW (ref 39.0–52.0)
HEMOGLOBIN: 12.6 g/dL — AB (ref 13.0–17.0)
Hemoglobin: 13.1 g/dL (ref 13.0–17.0)
MCH: 30.1 pg (ref 26.0–34.0)
MCH: 29.4 pg (ref 26.0–34.0)
MCHC: 35.9 g/dL (ref 30.0–36.0)
MCHC: 34.9 g/dL (ref 30.0–36.0)
MCV: 83.9 fL (ref 78.0–100.0)
MCV: 84.1 fL (ref 78.0–100.0)
PLATELETS: 260 10*3/uL (ref 150–400)
Platelets: 247 10*3/uL (ref 150–400)
RBC: 4.15 MIL/uL — AB (ref 4.22–5.81)
RBC: 4.46 MIL/uL (ref 4.22–5.81)
RDW: 12.6 % (ref 11.5–15.5)
RDW: 12.6 % (ref 11.5–15.5)
WBC: 12.4 10*3/uL — ABNORMAL HIGH (ref 4.0–10.5)
WBC: 6.4 10*3/uL (ref 4.0–10.5)

## 2015-12-15 LAB — MAGNESIUM
MAGNESIUM: 1.5 mg/dL — AB (ref 1.7–2.4)
MAGNESIUM: 1.6 mg/dL — AB (ref 1.7–2.4)
MAGNESIUM: 2.2 mg/dL (ref 1.7–2.4)

## 2015-12-15 LAB — CYTOLOGY, (ORAL, ANAL, URETHRAL) ANCILLARY ONLY
Chlamydia: NEGATIVE
Neisseria Gonorrhea: NEGATIVE

## 2015-12-15 LAB — BASIC METABOLIC PANEL
Anion gap: 10 (ref 5–15)
Anion gap: 8 (ref 5–15)
BUN: 10 mg/dL (ref 6–20)
BUN: 13 mg/dL (ref 6–20)
CHLORIDE: 90 mmol/L — AB (ref 101–111)
CHLORIDE: 99 mmol/L — AB (ref 101–111)
CO2: 22 mmol/L (ref 22–32)
CO2: 20 mmol/L — ABNORMAL LOW (ref 22–32)
CREATININE: 0.85 mg/dL (ref 0.61–1.24)
Calcium: 8.3 mg/dL — ABNORMAL LOW (ref 8.9–10.3)
Calcium: 8.7 mg/dL — ABNORMAL LOW (ref 8.9–10.3)
Creatinine, Ser: 0.8 mg/dL (ref 0.61–1.24)
GFR calc Af Amer: >60 mL/min (ref >60)
GFR calc non Af Amer: >60 mL/min (ref >60)
GLUCOSE: 189 mg/dL — AB (ref 65–99)
Glucose, Bld: 145 mg/dL — ABNORMAL HIGH (ref 65–99)
POTASSIUM: 4.2 mmol/L (ref 3.5–5.1)
POTASSIUM: 4.7 mmol/L (ref 3.5–5.1)
SODIUM: 122 mmol/L — AB (ref 135–145)
SODIUM: 127 mmol/L — AB (ref 135–145)

## 2015-12-15 LAB — RAPID URINE DRUG SCREEN, HOSP PERFORMED
AMPHETAMINES: NOT DETECTED
BARBITURATES: NOT DETECTED
BENZODIAZEPINES: NOT DETECTED
COCAINE: NOT DETECTED
OPIATES: NOT DETECTED
TETRAHYDROCANNABINOL: NOT DETECTED

## 2015-12-15 LAB — I-STAT CG4 LACTIC ACID, ED: Lactic Acid, Venous: 1.9 mmol/L (ref 0.5–1.9)

## 2015-12-15 LAB — GLUCOSE, CAPILLARY
GLUCOSE-CAPILLARY: 160 mg/dL — AB (ref 65–99)
GLUCOSE-CAPILLARY: 181 mg/dL — AB (ref 65–99)
Glucose-Capillary: 190 mg/dL — ABNORMAL HIGH (ref 65–99)
Glucose-Capillary: 162 mg/dL — ABNORMAL HIGH (ref 65–99)
Glucose-Capillary: 115 mg/dL — ABNORMAL HIGH (ref 65–99)

## 2015-12-15 LAB — BRAIN NATRIURETIC PEPTIDE: B Natriuretic Peptide: 492 pg/mL — ABNORMAL HIGH (ref 0.0–100.0)

## 2015-12-15 LAB — PHOSPHORUS
PHOSPHORUS: 4.6 mg/dL (ref 2.5–4.6)
Phosphorus: 4.8 mg/dL — ABNORMAL HIGH (ref 2.5–4.6)
Phosphorus: 3.7 mg/dL (ref 2.5–4.6)

## 2015-12-15 LAB — RESPIRATORY PANEL BY PCR
Adenovirus: NOT DETECTED
BORDETELLA PERTUSSIS-RVPCR: NOT DETECTED
CORONAVIRUS 229E-RVPPCR: NOT DETECTED
CORONAVIRUS HKU1-RVPPCR: NOT DETECTED
CORONAVIRUS NL63-RVPPCR: NOT DETECTED
CORONAVIRUS OC43-RVPPCR: NOT DETECTED
Chlamydophila pneumoniae: NOT DETECTED
Influenza A: NOT DETECTED
Influenza B: NOT DETECTED
METAPNEUMOVIRUS-RVPPCR: NOT DETECTED
Mycoplasma pneumoniae: NOT DETECTED
PARAINFLUENZA VIRUS 1-RVPPCR: NOT DETECTED
PARAINFLUENZA VIRUS 2-RVPPCR: NOT DETECTED
PARAINFLUENZA VIRUS 3-RVPPCR: NOT DETECTED
Parainfluenza Virus 4: NOT DETECTED
RHINOVIRUS / ENTEROVIRUS - RVPPCR: NOT DETECTED
Respiratory Syncytial Virus: NOT DETECTED

## 2015-12-15 LAB — OSMOLALITY: OSMOLALITY: 269 mosm/kg — AB (ref 275–295)

## 2015-12-15 LAB — PROCALCITONIN

## 2015-12-15 LAB — SODIUM, URINE, RANDOM: Sodium, Ur: 45 mmol/L

## 2015-12-15 LAB — URINE MICROSCOPIC-ADD ON

## 2015-12-15 LAB — AMMONIA: Ammonia: 28 umol/L (ref 9–35)

## 2015-12-15 LAB — TROPONIN I
TROPONIN I: 0.18 ng/mL — AB (ref <0.03)
Troponin I: 0.1 ng/mL (ref <0.03)

## 2015-12-15 LAB — MRSA PCR SCREENING: MRSA by PCR: NEGATIVE

## 2015-12-15 LAB — ETHANOL: Alcohol, Ethyl (B): <5 mg/dL (ref <5)

## 2015-12-15 LAB — OSMOLALITY, URINE: Osmolality, Ur: 262 mOsm/kg — ABNORMAL LOW (ref 300–900)

## 2015-12-15 LAB — STREP PNEUMONIAE URINARY ANTIGEN: Strep Pneumo Urinary Antigen: NEGATIVE

## 2015-12-15 MED ORDER — ETOMIDATE 2 MG/ML IV SOLN
30.0000 mg | Freq: Once | INTRAVENOUS | Status: AC
  Administered 2015-12-15: 30 mg via INTRAVENOUS

## 2015-12-15 MED ORDER — ASPIRIN 300 MG RE SUPP: 300.0000 mg | RECTAL | Status: AC

## 2015-12-15 MED ORDER — FUROSEMIDE 10 MG/ML IJ SOLN: 40.0000 mg | Freq: Once | INTRAMUSCULAR | Status: DC

## 2015-12-15 MED ORDER — ORAL CARE MOUTH RINSE
15.0000 mL | Freq: Four times a day (QID) | OROMUCOSAL | Status: DC
  Administered 2015-12-15 – 2015-12-16 (×6): 15 mL via OROMUCOSAL

## 2015-12-15 MED ORDER — PRO-STAT SUGAR FREE PO LIQD
30.0000 mL | Freq: Two times a day (BID) | ORAL | Status: DC
  Filled 2015-12-15: qty 30

## 2015-12-15 MED ORDER — IPRATROPIUM-ALBUTEROL 0.5-2.5 (3) MG/3ML IN SOLN: 3.0000 mL | Freq: Four times a day (QID) | RESPIRATORY_TRACT | Status: DC

## 2015-12-15 MED ORDER — SUCCINYLCHOLINE CHLORIDE 20 MG/ML IJ SOLN
100.0000 mg | Freq: Once | INTRAMUSCULAR | Status: AC
  Administered 2015-12-15: 100 mg via INTRAVENOUS

## 2015-12-15 MED ORDER — MAGNESIUM SULFATE 2 GM/50ML IV SOLN
2.0000 g | Freq: Once | INTRAVENOUS | Status: AC
Start: 2015-12-15 — End: 2015-12-15
  Administered 2015-12-15: 2 g via INTRAVENOUS
  Filled 2015-12-15: qty 50

## 2015-12-15 MED ORDER — VANCOMYCIN HCL 10 G IV SOLR
1250.0000 mg | Freq: Two times a day (BID) | INTRAVENOUS | Status: DC
  Filled 2015-12-15 (×2): qty 1250

## 2015-12-15 MED ORDER — PROPOFOL 1000 MG/100ML IV EMUL: 5.0000 ug/kg/min | INTRAVENOUS | Status: DC

## 2015-12-15 MED ORDER — IOPAMIDOL (ISOVUE-370) INJECTION 76%
INTRAVENOUS | Status: AC
  Administered 2015-12-15: 100 mL
  Filled 2015-12-15: qty 100

## 2015-12-15 MED ORDER — IPRATROPIUM-ALBUTEROL 0.5-2.5 (3) MG/3ML IN SOLN
3.0000 mL | RESPIRATORY_TRACT | Status: DC
Start: 2015-12-15 — End: 2015-12-18
  Administered 2015-12-15 – 2015-12-18 (×19): 3 mL via RESPIRATORY_TRACT
  Filled 2015-12-15 (×19): qty 3

## 2015-12-15 MED ORDER — PROPOFOL 1000 MG/100ML IV EMUL
INTRAVENOUS | Status: AC
  Administered 2015-12-15: 01:00:00
  Filled 2015-12-15: qty 100

## 2015-12-15 MED ORDER — CHLORHEXIDINE GLUCONATE 0.12% ORAL RINSE (MEDLINE KIT)
15.0000 mL | Freq: Two times a day (BID) | OROMUCOSAL | Status: DC
  Administered 2015-12-15 – 2015-12-16 (×3): 15 mL via OROMUCOSAL

## 2015-12-15 MED ORDER — FENTANYL CITRATE (PF) 100 MCG/2ML IJ SOLN
25.0000 ug | INTRAMUSCULAR | Status: DC | PRN
  Administered 2015-12-15 – 2015-12-16 (×3): 50 ug via INTRAVENOUS
  Filled 2015-12-15 (×3): qty 2

## 2015-12-15 MED ORDER — INSULIN ASPART 100 UNIT/ML ~~LOC~~ SOLN
2.0000 [IU] | SUBCUTANEOUS | Status: DC
  Administered 2015-12-15 – 2015-12-16 (×5): 4 [IU] via SUBCUTANEOUS
  Administered 2015-12-16: 6 [IU] via SUBCUTANEOUS
  Administered 2015-12-16 (×3): 2 [IU] via SUBCUTANEOUS
  Administered 2015-12-17: 4 [IU] via SUBCUTANEOUS
  Administered 2015-12-17 (×2): 2 [IU] via SUBCUTANEOUS

## 2015-12-15 MED ORDER — METHYLPREDNISOLONE SODIUM SUCC 40 MG IJ SOLR
40.0000 mg | Freq: Four times a day (QID) | INTRAMUSCULAR | Status: DC
  Administered 2015-12-15 – 2015-12-17 (×9): 40 mg via INTRAVENOUS
  Filled 2015-12-15 (×10): qty 1

## 2015-12-15 MED ORDER — SODIUM CHLORIDE 0.9 % IV SOLN
250.0000 mL | INTRAVENOUS | Status: DC
  Administered 2015-12-15: 250 mL via INTRAVENOUS

## 2015-12-15 MED ORDER — FAMOTIDINE IN NACL 20-0.9 MG/50ML-% IV SOLN
20.0000 mg | Freq: Two times a day (BID) | INTRAVENOUS | Status: DC
  Administered 2015-12-15 – 2015-12-17 (×6): 20 mg via INTRAVENOUS
  Filled 2015-12-15 (×9): qty 50

## 2015-12-15 MED ORDER — PIPERACILLIN-TAZOBACTAM 3.375 G IVPB
3.3750 g | Freq: Three times a day (TID) | INTRAVENOUS | Status: DC
  Filled 2015-12-15 (×2): qty 50

## 2015-12-15 MED ORDER — MAGNESIUM SULFATE 2 GM/50ML IV SOLN
2.0000 g | Freq: Once | INTRAVENOUS | Status: AC
  Administered 2015-12-15: 2 g via INTRAVENOUS
  Filled 2015-12-15: qty 50

## 2015-12-15 MED ORDER — METHYLPREDNISOLONE SODIUM SUCC 125 MG IJ SOLR: 60.0000 mg | Freq: Two times a day (BID) | INTRAMUSCULAR | Status: DC

## 2015-12-15 MED ORDER — DEXMEDETOMIDINE HCL IN NACL 400 MCG/100ML IV SOLN
0.4000 ug/kg/h | INTRAVENOUS | Status: DC
  Administered 2015-12-15: 0.9 ug/kg/h via INTRAVENOUS
  Administered 2015-12-15 (×2): 1 ug/kg/h via INTRAVENOUS
  Administered 2015-12-16: 0.8 ug/kg/h via INTRAVENOUS
  Administered 2015-12-16: 0.9 ug/kg/h via INTRAVENOUS
  Administered 2015-12-16: 0.8 ug/kg/h via INTRAVENOUS
  Administered 2015-12-17: 0.6 ug/kg/h via INTRAVENOUS
  Filled 2015-12-15 (×7): qty 100

## 2015-12-15 MED ORDER — SODIUM CHLORIDE 0.9 % IV BOLUS (SEPSIS)
1000.0000 mL | Freq: Once | INTRAVENOUS | Status: AC
  Administered 2015-12-15: 1000 mL via INTRAVENOUS

## 2015-12-15 MED ORDER — PHENYLEPHRINE HCL 10 MG/ML IJ SOLN
0.0000 ug/min | INTRAVENOUS | Status: DC
  Administered 2015-12-15: 20 ug/min via INTRAVENOUS
  Administered 2015-12-15: 30 ug/min via INTRAVENOUS
  Administered 2015-12-16: 15 ug/min via INTRAVENOUS
  Filled 2015-12-15 (×3): qty 1

## 2015-12-15 MED ORDER — VITAL AF 1.2 CAL PO LIQD
1000.0000 mL | ORAL | Status: DC
  Administered 2015-12-15: 1000 mL

## 2015-12-15 MED ORDER — VITAL HIGH PROTEIN PO LIQD
1000.0000 mL | ORAL | Status: DC
  Administered 2015-12-15: 1000 mL

## 2015-12-15 MED ORDER — DEXMEDETOMIDINE HCL IN NACL 400 MCG/100ML IV SOLN
0.4000 ug/kg/h | INTRAVENOUS | Status: DC
  Administered 2015-12-15: 0.4 ug/kg/h via INTRAVENOUS
  Administered 2015-12-15: 1 ug/kg/h via INTRAVENOUS
  Filled 2015-12-15 (×3): qty 50

## 2015-12-15 MED ORDER — ENOXAPARIN SODIUM 40 MG/0.4ML ~~LOC~~ SOLN
40.0000 mg | Freq: Every day | SUBCUTANEOUS | Status: DC
  Administered 2015-12-15 – 2015-12-19 (×5): 40 mg via SUBCUTANEOUS
  Filled 2015-12-15 (×5): qty 0.4

## 2015-12-15 MED ORDER — SODIUM CHLORIDE 0.9 % IV SOLN: 250.0000 mL | INTRAVENOUS | Status: DC | PRN

## 2015-12-15 MED ORDER — ASPIRIN 81 MG PO CHEW: 324.0000 mg | CHEWABLE_TABLET | ORAL | Status: AC

## 2015-12-15 MED ORDER — BUDESONIDE 0.5 MG/2ML IN SUSP
0.5000 mg | Freq: Two times a day (BID) | RESPIRATORY_TRACT | Status: DC
  Administered 2015-12-15 – 2015-12-19 (×9): 0.5 mg via RESPIRATORY_TRACT
  Filled 2015-12-15 (×9): qty 2

## 2015-12-15 NOTE — ED Notes (Addendum)
1st intubation attempt, successful. 24 at the lip. Equal breath sounds, nothing in the stomach.

## 2015-12-15 NOTE — H&P (Addendum)
PULMONARY / CRITICAL CARE MEDICINE   Name: Juan [name] MRN: [id number] DOB: [birth year]    ADMISSION DATE:  12/14/2015  CHIEF COMPLAINT:  Shortness of breath  HISTORY OF PRESENT ILLNESS:   History provided by patient's wife at bedside.  Juan [name] is a 65 y.o. male with history of alcoholism, COPD (not on oxygen), tobacco use disorder, atrial fibrillation (not on anticoagulation), diastolic heart failure and hypertension who presents to ED due to dyspnea. Intubated  SUBJECTIVE:  Remains on vent On 50% peep 5  VITAL SIGNS: BP (!) 86/67   Pulse 79   Temp 98 F (36.7 C) (Oral)   Resp 18   Wt 71.1 kg (156 lb 12 oz)   SpO2 100%   BMI 23.83 kg/m   HEMODYNAMICS:    VENTILATOR SETTINGS: Vent Mode: PRVC FiO2 (%):  [50 %-60 %] 50 % Set Rate:  [14 bmp] 14 bmp Vt Set:  [600 mL] 600 mL PEEP:  [5 cmH20] 5 cmH20 Plateau Pressure:  [30 cmH20] 30 cmH20  INTAKE / OUTPUT: I/O last 3 completed shifts: In: 2303.5 [I.V.:203.5; IV Piggyback:2100] Out: 250 [Urine:250]  PHYSICAL EXAMINATION: General: awake now Neuro: nonfocal, FC, perrl HEENT: ett Cardiovascular: s1 s2 RR tachy resolbved Lungs: wheezing exp , ronchi Abdomen: Soft, nontender, bowel sounds present Musculoskeletal: no edema Skin: No apparent skin lesion  LABS:  BMET  Recent Labs Lab 12/14/15 2259 12/15/15 0328  NA 119* 122*  K 4.5 4.2  CL 84* 90*  CO2 21* 22  BUN 8 10  CREATININE 0.73 0.85  GLUCOSE 159* 189*    Electrolytes  Recent Labs Lab 12/14/15 2259 12/15/15 0328  CALCIUM 9.9 8.3*  MG  --  1.5*  PHOS  --  4.8*    CBC  Recent Labs Lab 12/14/15 2259 12/15/15 0328  WBC 12.6* 12.4*  HGB 15.7 12.6*  HCT 42.8 34.8*  PLT 269 247    Coag's No results for input(s): APTT, INR in the last 168 hours.  Sepsis Markers  Recent Labs Lab 12/15/15 0053 12/15/15 0328  LATICACIDVEN 1.90  --   PROCALCITON  --  <0.10    ABG  Recent Labs Lab 12/14/15 2348  PHART 7.371   PCO2ART 48.6*  PO2ART 121.0*    Liver Enzymes  Recent Labs Lab 12/14/15 2259  AST 25  ALT 14*  ALKPHOS 63  BILITOT 1.0  ALBUMIN 4.3    Cardiac Enzymes  Recent Labs Lab 12/15/15 0328  TROPONINI 0.18*    Glucose  Recent Labs Lab 12/15/15 0354  GLUCAP 190*    Imaging Ct Head Wo Contrast  Result Date: 12/15/2015 1. No pulmonary embolus. 2. Improved peribronchovascular opacities of the lingula, with unchanged right upper lobe nodular opacities. The findings may indicate chronic atypical mycobacterial infection. 3. Aortic and coronary artery atherosclerosis.   Dg Chest Portable 1 View  Result Date: 12/15/2015 IMPRESSION: Endotracheal tube is slightly low lying at 2.3 cm above the carina. Pullback approximately 2 cm. Gastric tube is seen below the diaphragm. Faint pneumonic infiltrates noted in the right upper and lower lobe.   Dg Chest Portable 1 View  Result Date: 12/14/2015 IMPRESSION: Patchy opacities in the right upper lobe and left base could relate to early infiltrates. Bronchial wall thickening could relate to inflammation.     CULTURES: Blood culture 12/15/15 Urine culture 12/15/15 Respiratory viral panel in 12/07/15  ANTIBIOTICS: Vancomycin 12/15/15>>> Zosyn 12/15/15>>>  SIGNIFICANT EVENTS: 12/14/15: Presented to ED with worsening dyspnea, hyponatremia, intubated and had altered mental  status  LINES/TUBES: ETT 12/15/15 Peripheral IV in 11/90/17  DISCUSSION: Juan [name] is a 65 y.o. male with history of alcoholism, COPD (not on oxygen), tobacco use disorder, atrial fibrillation (not on anticoagulation), diastolic heart failure and hypertension who presents with worsening dyspnea and hyponatremia. Chest x-ray concerning for pneumonia. He was intubated and developed altered mental status. CTA without PE but concerning for an infiltration in the pattern of mycobacterial infection. Chest x-ray concerning for right upper lobe pneumonia and left  lower lobe pneumonia. Also with hyponatremia likely beer bear potomania. Will admit to ICU due to dyspnea with altered mental status.   ASSESSMENT / PLAN:  PULMONARY A: COPD exacerbation. No PFT in the system Tobacco use disorder Pneumonia - NOT seen on CT or imaging ensure no ischemia P:   PRVC, avoid high rates  SBt today unlikely to extubate DuoNeb every 6 hours Continue Solu-Medrol Daily WUA PCT assessment for LRTI, see ID  CARDIOVASCULAR A: Hypertension Diastolic heart failure: BNP 500 Atrial fibrillation: Not on anticoagulation. CHAD-Vasc 2-3 Hypotension: Likely due to propofol (discontinued in ED) P:  Neo gtt for MAP <65  Or SBP <90 IV fluids fo rna Trend troponin in 6 hours  RENAL A:   Hyponatremia: Likely beer potomania.  hypomag P:   IV normal saline to 75 cc/kg  bmet q12h on pm Ensure serum osm, Ua , urine osm , na supp mag  GASTROINTESTINAL A:   NPO P:   Pepcid Start feeds  HEMATOLOGIC A:   Mild leukocytosis, dvt prevention P:  Daily CBC lov  INFECTIOUS A:   Concern for sepsis likely source lung Pneumonia- NOT seen on CT or clinically P:   Continue vancomycin and Zosyn pending culture PCT- neg, see fda claim from march - LRTI- consider dc vosyn, appears as copd exacerbation  Follow clinical course  NEUROLOGIC A:   Acute encephalopathy - metabolic Alcohol use disorder P:   RASS goal: 0 to -1 Fentanyl when necessary, not as drip Precedex gtt Klonopin 1 mg twice a day Ethanol level UDS  FAMILY  - Updates: no fam  - Inter-disciplinary family meet or Palliative Care meeting due by: 12/22/15  Ccm time 50 min   Mcarthur Rossettianiel J. Tyson AliasFeinstein, MD, FACP Pgr: [phone] [facility]

## 2015-12-15 NOTE — ED Notes (Signed)
Pt waking up and attempting to pull out tube.

## 2015-12-15 NOTE — Progress Notes (Signed)
Initial Nutrition Assessment  DOCUMENTATION CODES:   Non-severe (moderate) malnutrition in context of chronic illness  INTERVENTION:   - Vital AF 1.2 @ 60 mL/hr (1440 mL) to provide 1728 kcal (101% estimated energy needs), 108 grams protein (100% estimated protein needs), and 1166 mL free water daily.  NUTRITION DIAGNOSIS:   Inadequate oral intake related to inability to eat as evidenced by NPO status.  GOAL:   Patient will meet greater than or equal to 90% of their needs  MONITOR:   Vent status, Labs, Weight trends, TF tolerance, Skin  REASON FOR ASSESSMENT:   Consult, Ventilator Enteral/tube feeding initiation and management  ASSESSMENT:   65 y.o.male with history of alcoholism, COPD (not on oxygen), tobacco use disorder, atrial fibrillation, diastolic heart failure and hypertension who presents to ED due to dyspnea. Per patient's wife, patient had worsening dyspnea, nausea and poor appetite for the last 3 weeks.  Spoke with wife at bedside who reports pt's appetite varies and that PO intake is poor when pt has been drinking. [name] reports pt has been consuming approximately 4 beers per day for the past few weeks but started consuming bourbon in the past few days.  Per weight history, pt has lost approximately 30# in one year (16% weight loss, insignificant for timeframe).  The pt is currently intubated on ventilator support. MV: 10.4 L/min Highest temperature in 24 hours: 37 degrees Celsius BP (MAP): 105/78 (88)  Pt with OG tube present within the esophagus coursing into the stomach. Vital High Protein infusing through OG tube @ 40 mL/hr at time of visit. Will adjust TF regimen to meet pt's estimated needs.  Drips: Precedex @ 1 mcg/kg/hr, Neo @ 20 mcg/min  Medications reviewed and include 40 mg Lovenox, 20 mg Pepcid BID, sliding scale Novolog, Precedex, Neo-Synephrine  Labs reviewed and include low sodium (122 mmol/L), low calcium (8.3 mg/dL), elevated phosphorus  (4.8 mg/dL), low magnesium (1.5 mg/dL) CBG: 728 mg/dL  NFPE: Exam completed. Mild fat depletion, moderate muscle depletion, and mild edema noted.  Diet Order:  Diet NPO time specified  Skin:  Reviewed, no issues (No information available)  Last BM:  PTA  Height:   Ht Readings from Last 1 Encounters:  01/03/15 5\' 8"  (1.727 m)    Weight:   Wt Readings from Last 1 Encounters:  12/15/15 156 lb 12 oz (71.1 kg)    Ideal Body Weight:  70 kg  BMI:  Body mass index is 23.83 kg/m.  Estimated Nutritional Needs:   Kcal:  1711  Protein:  100-115 grams (1.4-1.6 g/kg)  Fluid:  >/= 1.7 L/day  EDUCATION NEEDS:   No education needs identified at this time  Rosemarie Ax Dietetic Intern Pager Number: [phone]

## 2015-12-15 NOTE — H&P (Addendum)
PULMONARY / CRITICAL CARE MEDICINE   Name: Juan [name] MRN: [id number] DOB: October 21, 1950    ADMISSION DATE:  12/14/2015  CHIEF COMPLAINT:  Shortness of breath  HISTORY OF PRESENT ILLNESS:   History provided by patient's wife at bedside.  Juan [name] is a 65 y.o. male with history of alcoholism, COPD (not on oxygen), tobacco use disorder, atrial fibrillation (not on anticoagulation), diastolic heart failure and hypertension who presents to ED due to dyspnea.   Per patient's wife, patient had worsening dyspnea, nausea and poor appetite for the last 3 weeks. "He does not like going to doctors". He was out of his inhalers. He finally got it filled today and he used it 3 times without improvement in his shortness of breath. He has been using his nebulizer "a lot" for over a week. He also had productive cough with whitish and yellowish phlegm. Patient's wife is not sure if this hemoptysis. She denies fever, emesis or diarrhea.   In ED, he was treated with breathing treatment and steroid for COPD exacerbation. He was tachypneic and tachycardic. Blood pressure is 161/112. Blood work was remarkable for sodium of 119, BNP of 492 and WBC of 12. EKG with RBBB and LAFB (old). Troponin is 0.08.  Patient was intubated out of concern for bilateral infiltrates and sepsis. Lactic acid and ABG were normal. Cultures were obtained. He received 30 cc/kg fluid and BSA. However, he developed altered mental status before he completed the third bolus bag and CCM was called for admission.   PAST MEDICAL HISTORY :  He  has a past medical history of Alcoholism (HCC); Allergy; Anxiety; Asthma; CHF (congestive heart failure) (HCC) (10/2010); COPD (chronic obstructive pulmonary disease) (HCC); Depression; Headache; Hyperlipidemia; Hypertension; and Seizures (HCC).  PAST SURGICAL HISTORY: He  has a past surgical history that includes Toenail excision (Right, 1967).  Allergies  Allergen Reactions  . Coreg  [Carvedilol] Shortness Of Breath    No current facility-administered medications on file prior to encounter.    Current Outpatient Prescriptions on File Prior to Encounter  Medication Sig  . acetaminophen (TYLENOL) 325 MG tablet Take 2 tablets (650 mg total) by mouth every 6 (six) hours as needed for moderate pain.  Marland Kitchen. albuterol (PROVENTIL) (2.5 MG/3ML) 0.083% nebulizer solution INHALE 1 VIAL VIA NEBULIZER EVERY 6 HOURS AS NEEDED FOR WHEEZING OR SHORTNESS OF BREATH  . clonazePAM (KLONOPIN) 0.5 MG tablet TAKE 1 TAB BY MOUTH TWICE DAILY AS NEEDED FOR ANXIETY  . clonazePAM (KLONOPIN) 1 MG tablet Take 1 tablet (1 mg total) by mouth 2 (two) times daily as needed for anxiety.  . hydroxypropyl methylcellulose / hypromellose (ISOPTO TEARS / GONIOVISC) 2.5 % ophthalmic solution Place 1 drop into both eyes 3 (three) times daily as needed for dry eyes.  . Ipratropium-Albuterol (COMBIVENT RESPIMAT) 20-100 MCG/ACT AERS respimat INHALE TWO PUFFS BY MOUTH EVERY FOUR HOURS AS NEEDED FOR WHEEZING  . Multiple Vitamin (MULTIVITAMIN WITH MINERALS) TABS tablet Take 1 tablet by mouth daily.  . [DISCONTINUED] albuterol (PROVENTIL,VENTOLIN) 90 MCG/ACT inhaler Inhale 2 puffs into the lungs every 6 (six) hours as needed for wheezing.    FAMILY HISTORY:  His indicated that his mother is deceased. He indicated that his father is deceased. He indicated that the status of his maternal uncle is unknown.    SOCIAL HISTORY: He  reports that he has been smoking Cigarettes.  He has a 60.00 pack-year smoking history. He has never used smokeless tobacco. He reports that he drinks alcohol. He reports  that he does not use drugs.  REVIEW OF SYSTEMS:   Patient sedated to provide history  SUBJECTIVE:  Patient sedated to provide history  VITAL SIGNS: BP 100/66   Pulse (!) 124   Temp 98.4 F (36.9 C) (Rectal)   Resp (!) 30   Wt 83 kg (182 lb 15.7 oz) Comment: estimate on previous weight to get Precedex started  SpO2 97%    BMI 27.82 kg/m   HEMODYNAMICS:    VENTILATOR SETTINGS: Vent Mode: PRVC FiO2 (%):  [50 %-60 %] 50 % Set Rate:  [14 bmp] 14 bmp Vt Set:  [600 mL] 600 mL PEEP:  [5 cmH20] 5 cmH20 Plateau Pressure:  [30 cmH20] 30 cmH20  INTAKE / OUTPUT: No intake/output data recorded.  PHYSICAL EXAMINATION: General: Obtunded, intubated Neuro: Obtunded, no response to noxious stimuli, pupils equal, round and mildly reactive to light.  HEENT: Atraumatic head. Sclera anicteric. ETT tube in place Cardiovascular: Irregularly irregular, tachycardic to 110s, no murmurs, trace edema bilaterally, diminished DP pulses bilaterally Lungs: ETT in place, rhonchi bilaterally, good air movement Abdomen: Soft, nontender, bowel sounds present Musculoskeletal: Symmetrical with normal muscle mass Skin: No apparent skin lesion  LABS:  BMET  Recent Labs Lab 12/14/15 2259  NA 119*  K 4.5  CL 84*  CO2 21*  BUN 8  CREATININE 0.73  GLUCOSE 159*    Electrolytes  Recent Labs Lab 12/14/15 2259  CALCIUM 9.9    CBC  Recent Labs Lab 12/14/15 2259  WBC 12.6*  HGB 15.7  HCT 42.8  PLT 269    Coag's No results for input(s): APTT, INR in the last 168 hours.  Sepsis Markers  Recent Labs Lab 12/15/15 0053  LATICACIDVEN 1.90    ABG  Recent Labs Lab 12/14/15 2348  PHART 7.371  PCO2ART 48.6*  PO2ART 121.0*    Liver Enzymes  Recent Labs Lab 12/14/15 2259  AST 25  ALT 14*  ALKPHOS 63  BILITOT 1.0  ALBUMIN 4.3    Cardiac Enzymes No results for input(s): TROPONINI, PROBNP in the last 168 hours.  Glucose No results for input(s): GLUCAP in the last 168 hours.  Imaging Ct Head Wo Contrast  Result Date: 12/15/2015 1. No pulmonary embolus. 2. Improved peribronchovascular opacities of the lingula, with unchanged right upper lobe nodular opacities. The findings may indicate chronic atypical mycobacterial infection. 3. Aortic and coronary artery atherosclerosis.   Dg Chest Portable 1  View  Result Date: 12/15/2015 IMPRESSION: Endotracheal tube is slightly low lying at 2.3 cm above the carina. Pullback approximately 2 cm. Gastric tube is seen below the diaphragm. Faint pneumonic infiltrates noted in the right upper and lower lobe.   Dg Chest Portable 1 View  Result Date: 12/14/2015 IMPRESSION: Patchy opacities in the right upper lobe and left base could relate to early infiltrates. Bronchial wall thickening could relate to inflammation.     CULTURES: Blood culture 12/15/15 Urine culture 12/15/15 Respiratory viral panel in 12/07/15  ANTIBIOTICS: Vancomycin 12/15/15 Zosyn 12/15/15  SIGNIFICANT EVENTS: 12/14/15: Presented to ED with worsening dyspnea, hyponatremia, intubated and had altered mental status  LINES/TUBES: ETT 12/15/15 Peripheral IV in 11/90/17  DISCUSSION: Juan [name] is a 65 y.o. male with history of alcoholism, COPD (not on oxygen), tobacco use disorder, atrial fibrillation (not on anticoagulation), diastolic heart failure and hypertension who presents with worsening dyspnea and hyponatremia. Chest x-ray concerning for pneumonia. He was intubated and developed altered mental status. CTA without PE but concerning for an infiltration in the pattern of  mycobacterial infection. Chest x-ray concerning for right upper lobe pneumonia and left lower lobe pneumonia. Also with hyponatremia likely beer bear potomania. Will admit to ICU due to dyspnea with altered mental status.   ASSESSMENT / PLAN:  PULMONARY A: COPD exacerbation. No PFT in the system Tobacco use disorder Pneumonia P:   PRVC DuoNeb every 6 hours Continue Solu-Medrol Antibiotic as below Daily WUA PCT ? QuantiFERON gold  CARDIOVASCULAR A: Hypertension Diastolic heart failure: BNP 500 Atrial fibrillation: Not on anticoagulation. CHAD-Vasc 2-3 Hypotension: Likely due to propofol (discontinued in ED) P:  Neo gtt for MAP <65  Or SBP <90 IV fluid  Diuretics when stable Trend  troponin Repeat EKG in the morning  RENAL A:   Hyponatremia: Likely beer potomania.  P:   IV normal saline Daily BMP, Mg and P  GASTROINTESTINAL A:   No issues P:   Pepcid  HEMATOLOGIC A:   Mild leukocytosis  P:  Daily CBC  INFECTIOUS A:   Concern for sepsis likely source lung Pneumonia P:   Continue vancomycin and Zosyn pending culture PCT Daily CBC Follow cultures Urine Legionella Urinary strep pneumo  NEUROLOGIC A:   Acute encephalopathy Alcohol use disorder P:   RASS goal: 0 to -1 Fentanyl when necessary Precedex gtt Klonopin 1 mg twice a day Ethanol level UDS  FAMILY  - Updates: Plan of care discussed his wife who was at bedside  - Inter-disciplinary family meet or Palliative Care meeting due by: 12/22/15  Candelaria Stagers, MD.  12/15/15 3:39 AM PGY-2, [facility]  12/15/2015, 3:16 AM   ATTENDING NOTE / ATTESTATION NOTE :   I have discussed the case with the resident/APP  Dr. Candelaria Stagers.   I agree with the resident/APP's  history, physical examination, assessment, and plans.    I have edited the above note and modified it according to our agreed history, physical examination, assessment and plan.   Pt admitted and intubated for acute on chronic SOB. Patient has at least 40-pack-year smoking history, continues to smoke a pack a day. He is known to have COPD but is not on maintenance medicines. Since losing his job 6 years ago, he has been depressed, has been drinking alcohol, and has not really followed up with her doctor. He was admitted November 2016 for alcohol intoxication and withdrawal. He was given COPD medicines during that time but was not able to refill them until 12/14/2015.  Patient used to drink bourbon heavily but switched to beer the last month. Wife requested him to stop bourbon in order for her to get back at home. He drinks 5-6 12 ounce of beer every day. The last 3 weeks, his shortness of breath has slowly worsened.  He was very short of breath the day of admission to the point that they were able to get Combivent from his regular doctor. Patient uses Combivent without much help so the wife brought him to the emergency room. At the emergency room, he got that are lost treatment without much effect. Chest CT scan showed bilateral infiltrates, better compared to November 2016 chest CT scan. Patient was subsequently intubated for respiratory failure. Blood pressure dropped a little bit with sedation.  Patient seen, sedated, comfortable, not in distress. Blood pressure 102/78, pulse 80, respiratory rate 14. 100% sats on 50% FiO2. Cranial nerves grossly intact. No neck vein distention. Good air entry. Some faint wheezing in both upper lung zones. Some crackles at the bases. No rhonchi. No accessory muscle use. Good  S1 and S2. No S3 murmur rub or gallop. Abdomen was benign. No tenderness. Extremities revealed trace edema. No clubbing cyanosis.  Labs reviewed. CTA was negative for filling defects. Nodular opacities in the right upper lobe were similar compared to November 2016 CAT scan. In November 2016 CAT scan, he also had more infiltrates in the left upper lobe and lingula. These are significantly improved during this admission's chest CT scan. COPD changes. ABG 7.37, 48, 121. Sodium is 122 from 118. Creatinine was 0.85. White count of 12. Troponin of 0.18. Lactate 1.9. EKG shows right bundle branch block, no significant change compared to November 2016 EKG.  Assessment:  1. Acute on chronic hypoxemic hypercapnic respiratory failure secondary to severe exacerbation of COPD. Patient is not on maintenance medicines with poor compliance and follow-up. 2. Chronic infiltrates, right upper lung. Infiltrates in left upper lung better compared to last year. Concern for HCAP but less likely.  3. Likely demand ischemia. H/O Afib, not on OAC.  3. Chronic alcoholism. Depression.  4. Malnutrition, mild. 5. Hyponatremia, likely beer  potomania. 6. Noncompliance.  Plan : 1. Continue ventilatory support. Given degree and duration of exacerbation, I anticipate he is not ready for weaning until after the weekend. Obviously, we can try daily WUA and PST.  2. Panculture. Check pro calcitonin. Continue vancomycin and Zosyn. I anticipate we can de-escalate once cultures are negative. Last hospitalization was a year ago. 3. Continue IV steroids. Continue with Pulmicort and DuoNeb. 4. Continue to trend troponin. If next troponin is higher, plan to start heparin drip. Check echo.  5. Patient currently is on Precedex. He was on propofol earlier which dropped his leg pressure. He is on low-dose Neo-Synephrine most likely related to sedation. 6. Start tube feeds when able. Wean off fluids once on tube feeds. 7. DVT prophylaxis. 8. Watch out for alcohol withdrawal. May be worthwhile to place him on low-dose scheduled benzodiazepine.   I have spent 35  minutes of critical care time with this patient today.  Family :Family updated at length today.  I extensively discussed the case with patient's wife at bedside.  Pollie Meyer, MD 12/15/2015, 5:27 AM [facility] Pager [phone] After 3 pm or if no answer, call [phone]

## 2015-12-15 NOTE — Procedures (Signed)
Intubation Procedure Note CHANDLER ANDERSEN [id number] [birth year]  Procedure: Intubation Indications: Airway protection and maintenance  Procedure Details Consent: Risks of procedure as well as the alternatives and risks of each were explained to the (patient/caregiver).  Consent for procedure obtained. Time Out: Verified patient identification, verified procedure, site/side was marked, verified correct patient position, special equipment/implants available, medications/allergies/relevent history reviewed, required imaging and test results available.  Performed  Maximum sterile technique was used including gloves.  MAC    Evaluation Hemodynamic Status: BP stable throughout; O2 sats: stable throughout Patient's Current Condition: stable Complications: No apparent complications Patient did tolerate procedure well. Chest X-ray ordered to verify placement.  CXR: pending.   Rushie Chestnut [facility] 12/15/2015

## 2015-12-15 NOTE — Progress Notes (Signed)
Pharmacy Antibiotic Note  Juan [name] is a 65 y.o. male admitted on 12/14/2015 with sepsis. Pharmacy has been consulted for Vancomycin/Zosyn dosing. WBC with mild elevation, renal function good, sodium on the low side some patchy opacities on CXR.   Plan: -Vancomycin 1250 mg IV q12h -Zosyn 3.375G IV q8h to be infused over 4 hours -Trend WBC, temp, renal function  -Drug levels as indicated   Temp (24hrs), Avg:98.2 F (36.8 C), Min:98.2 F (36.8 C), Max:98.2 F (36.8 C)   Recent Labs Lab 12/14/15 2259  WBC 12.6*  CREATININE 0.73    CrCl cannot be calculated (Unknown ideal weight.).    Allergies  Allergen Reactions  . Coreg [Carvedilol] Shortness Of Breath    Geordie, Hake 12/15/2015 12:38 AM

## 2015-12-16 ENCOUNTER — Inpatient Hospital Stay (HOSPITAL_COMMUNITY): Payer: Managed Care, Other (non HMO)

## 2015-12-16 LAB — GLUCOSE, CAPILLARY
GLUCOSE-CAPILLARY: 138 mg/dL — AB (ref 65–99)
GLUCOSE-CAPILLARY: 190 mg/dL — AB (ref 65–99)
GLUCOSE-CAPILLARY: 195 mg/dL — AB (ref 65–99)
Glucose-Capillary: 211 mg/dL — ABNORMAL HIGH (ref 65–99)
Glucose-Capillary: 122 mg/dL — ABNORMAL HIGH (ref 65–99)
Glucose-Capillary: 131 mg/dL — ABNORMAL HIGH (ref 65–99)

## 2015-12-16 LAB — PHOSPHORUS: Phosphorus: 3.2 mg/dL (ref 2.5–4.6)

## 2015-12-16 LAB — CBC
HCT: 36 % — ABNORMAL LOW (ref 39.0–52.0)
Hemoglobin: 12.5 g/dL — ABNORMAL LOW (ref 13.0–17.0)
MCH: 29.3 pg (ref 26.0–34.0)
MCHC: 34.7 g/dL (ref 30.0–36.0)
MCV: 84.5 fL (ref 78.0–100.0)
PLATELETS: 281 10*3/uL (ref 150–400)
RBC: 4.26 MIL/uL (ref 4.22–5.81)
RDW: 12.8 % (ref 11.5–15.5)
WBC: 14.3 10*3/uL — AB (ref 4.0–10.5)

## 2015-12-16 LAB — URINE CULTURE: Culture: NO GROWTH

## 2015-12-16 LAB — BASIC METABOLIC PANEL
Anion gap: 7 (ref 5–15)
BUN: 20 mg/dL (ref 6–20)
CALCIUM: 8.8 mg/dL — AB (ref 8.9–10.3)
CO2: 23 mmol/L (ref 22–32)
CREATININE: 0.75 mg/dL (ref 0.61–1.24)
Chloride: 100 mmol/L — ABNORMAL LOW (ref 101–111)
GFR calc non Af Amer: >60 mL/min (ref >60)
Glucose, Bld: 183 mg/dL — ABNORMAL HIGH (ref 65–99)
Potassium: 4.5 mmol/L (ref 3.5–5.1)
SODIUM: 130 mmol/L — AB (ref 135–145)

## 2015-12-16 LAB — LEGIONELLA PNEUMOPHILA SEROGP 1 UR AG: L. pneumophila Serogp 1 Ur Ag: NEGATIVE

## 2015-12-16 LAB — PROCALCITONIN: Procalcitonin: 0.21 ng/mL

## 2015-12-16 LAB — MAGNESIUM: MAGNESIUM: 2.2 mg/dL (ref 1.7–2.4)

## 2015-12-16 MED ORDER — ASPIRIN 81 MG PO CHEW
81.0000 mg | CHEWABLE_TABLET | Freq: Every day | ORAL | Status: DC
  Administered 2015-12-16 – 2015-12-19 (×4): 81 mg via ORAL
  Filled 2015-12-16 (×4): qty 1

## 2015-12-16 MED ORDER — MAGNESIUM SULFATE 2 GM/50ML IV SOLN
2.0000 g | Freq: Once | INTRAVENOUS | Status: AC
  Administered 2015-12-16: 2 g via INTRAVENOUS
  Filled 2015-12-16: qty 50

## 2015-12-16 MED ORDER — THIAMINE HCL 100 MG/ML IJ SOLN
100.0000 mg | Freq: Every day | INTRAMUSCULAR | Status: DC
  Administered 2015-12-16 – 2015-12-17 (×2): 100 mg via INTRAVENOUS
  Filled 2015-12-16: qty 2
  Filled 2015-12-16 (×2): qty 1

## 2015-12-16 MED ORDER — ASPIRIN 81 MG PO CHEW: 324.0000 mg | CHEWABLE_TABLET | ORAL | Status: DC

## 2015-12-16 MED ORDER — FENTANYL CITRATE (PF) 100 MCG/2ML IJ SOLN
12.5000 ug | INTRAMUSCULAR | Status: DC | PRN
  Administered 2015-12-16 – 2015-12-17 (×3): 25 ug via INTRAVENOUS
  Filled 2015-12-16 (×3): qty 2

## 2015-12-16 MED ORDER — ASPIRIN 300 MG RE SUPP: 300.0000 mg | RECTAL | Status: DC

## 2015-12-16 MED ORDER — ALBUTEROL SULFATE (2.5 MG/3ML) 0.083% IN NEBU
INHALATION_SOLUTION | RESPIRATORY_TRACT | Status: AC
  Administered 2015-12-16: 2.5 mg
  Filled 2015-12-16: qty 3

## 2015-12-16 MED ORDER — ARFORMOTEROL TARTRATE 15 MCG/2ML IN NEBU
15.0000 ug | INHALATION_SOLUTION | Freq: Two times a day (BID) | RESPIRATORY_TRACT | Status: DC
  Administered 2015-12-16 – 2015-12-19 (×6): 15 ug via RESPIRATORY_TRACT
  Filled 2015-12-16 (×7): qty 2

## 2015-12-16 MED ORDER — FOLIC ACID 5 MG/ML IJ SOLN
1.0000 mg | Freq: Every day | INTRAMUSCULAR | Status: DC
  Administered 2015-12-16 – 2015-12-17 (×2): 1 mg via INTRAVENOUS
  Filled 2015-12-16 (×3): qty 0.2

## 2015-12-16 NOTE — Progress Notes (Signed)
PULMONARY / CRITICAL CARE MEDICINE   Name: Juan [name] MRN: [id number] DOB: 1951-02-04    ADMISSION DATE:  12/14/2015  CHIEF COMPLAINT:  Shortness of breath  BRIEF:   Juan [name] is a 65 y.o. male with history of alcoholism, COPD (not on oxygen), tobacco use disorder, atrial fibrillation (not on anticoagulation), diastolic heart failure and hypertension who presented to ED due to a COPD exacerbation.  SUBJECTIVE:  No acute issues  VITAL SIGNS: BP 108/65   Pulse 92   Temp 98.6 F (37 C) (Oral)   Resp (!) 25   Wt 73.4 kg (161 lb 13.1 oz)   SpO2 98%   BMI 24.60 kg/m   HEMODYNAMICS:    VENTILATOR SETTINGS: Vent Mode: CPAP;PSV FiO2 (%):  [30 %] 30 % Set Rate:  [14 bmp] 14 bmp Vt Set:  [600 mL] 600 mL PEEP:  [5 cmH20] 5 cmH20 Pressure Support:  [5 cmH20] 5 cmH20 Plateau Pressure:  [16 cmH20-26 cmH20] 20 cmH20  INTAKE / OUTPUT: I/O last 3 completed shifts: In: 4882.9 [I.V.:1422.9; NG/GT:1210; IV Piggyback:2250] Out: 2950 [Urine:2950]  PHYSICAL EXAMINATION: General: on vent, following commands HENT: NCAT ETT in place PULM: Wheezing bilaterally CV: RRR, no mgr GI: BS+, soft, nontender MSK: normal bulk and tone Neuro: Awake, on vent  LABS:  BMET  Recent Labs Lab 12/15/15 0328 12/15/15 1543 12/16/15 0521  NA 122* 127* 130*  K 4.2 4.7 4.5  CL 90* 99* 100*  CO2 22 20* 23  BUN 10 13 20   CREATININE 0.85 0.80 0.75  GLUCOSE 189* 145* 183*    Electrolytes  Recent Labs Lab 12/15/15 0328 12/15/15 0809 12/15/15 1543 12/16/15 0521  CALCIUM 8.3*  --  8.7* 8.8*  MG 1.5* 1.6* 2.2 2.2  PHOS 4.8* 4.6 3.7 3.2    CBC  Recent Labs Lab 12/15/15 0328 12/15/15 0809 12/16/15 0521  WBC 12.4* 6.4 14.3*  HGB 12.6* 13.1 12.5*  HCT 34.8* 37.5* 36.0*  PLT 247 260 281    Coag's No results for input(s): APTT, INR in the last 168 hours.  Sepsis Markers  Recent Labs Lab 12/15/15 0053 12/15/15 0328 12/16/15 0521  LATICACIDVEN 1.90  --   --    PROCALCITON  --  <0.10 0.21    ABG  Recent Labs Lab 12/14/15 2348  PHART 7.371  PCO2ART 48.6*  PO2ART 121.0*    Liver Enzymes  Recent Labs Lab 12/14/15 2259  AST 25  ALT 14*  ALKPHOS 63  BILITOT 1.0  ALBUMIN 4.3    Cardiac Enzymes  Recent Labs Lab 12/15/15 0328 12/15/15 1543  TROPONINI 0.18* 0.10*    Glucose  Recent Labs Lab 12/15/15 1200 12/15/15 1527 12/15/15 2000 12/16/15 0014 12/16/15 0346 12/16/15 0756  GLUCAP 181* 162* 115* 195* 211* 122*    Imaging Ct Head Wo Contrast  Result Date: 12/15/2015 1. No pulmonary embolus. 2. Improved peribronchovascular opacities of the lingula, with unchanged right upper lobe nodular opacities. The findings may indicate chronic atypical mycobacterial infection. 3. Aortic and coronary artery atherosclerosis.   Dg Chest Portable 1 View  Result Date: 12/15/2015 IMPRESSION: Endotracheal tube is slightly low lying at 2.3 cm above the carina. Pullback approximately 2 cm. Gastric tube is seen below the diaphragm. Faint pneumonic infiltrates noted in the right upper and lower lobe.   Dg Chest Portable 1 View  Result Date: 12/14/2015 IMPRESSION: Patchy opacities in the right upper lobe and left base could relate to early infiltrates. Bronchial wall thickening could relate to inflammation.  CULTURES: Blood culture 12/15/15 >  Urine culture 12/15/15 Respiratory viral panel in 12/07/15 > negative resp culture 11/10 > GPC in pairs  ANTIBIOTICS: Vancomycin 12/15/15>>> Zosyn 12/15/15>>>  SIGNIFICANT EVENTS: 12/14/15: Presented to ED with worsening dyspnea, hyponatremia, intubated and had altered mental status  LINES/TUBES: ETT 12/15/15 > 11/11 Peripheral IV in 11/90/17  DISCUSSION: Magda BernheimJames K Birchall is a 65 y.o. male with history of alcoholism, COPD (not on oxygen), tobacco use disorder, atrial fibrillation (not on anticoagulation), diastolic heart failure and hypertension who presents with acute respiratory  failure due to a COPD exacerbation.   ASSESSMENT / PLAN:  PULMONARY A: COPD exacerbation. No PFT in the system  Tobacco use disorder Pneumonia - NOT seen on CT or imaging P:   Continue solumedrol Continue budesonide, add brovana Continue bronchodilators as ordered Smoking cessation discussion  CARDIOVASCULAR A: Hypertension Diastolic heart failure: BNP 500 Atrial fibrillation: Not on anticoagulation. CHAD-Vasc 2-3 Hypotension due to propofol Demand ischemia P:  Tele IV fluids fo rna Trend troponin  RENAL A:   Hyponatremia: improving Hypomag P: Replace Mg Continue IVF today Monitor BMET and UOP Replace electrolytes as needed  GASTROINTESTINAL A:   NPO P:   Pepcid SLP evaluation post extubation   HEMATOLOGIC A:   Mild leukocytosis, dvt prevention P:  Monitor for bleeding DVT prophylaxis  INFECTIOUS A:   COPD exacerbation, no evidence of pneumonia P:   Monitor cultures Monitor for fever  NEUROLOGIC A:   Acute encephalopathy Alcohol use disorder, at risk for withdrawal P:   D/C sedation protocol Precedex gtt > wean off Klonopin 1 mg twice a day   FAMILY  - Updates: no family bedside  - Inter-disciplinary family meet or Palliative Care meeting due by: 12/22/15   CC time 37 minutes   Heber CarolinaBrent McQuaid, MD [facility] Pager: [phone] Cell: [phone] After 3pm or if no response, call [phone]

## 2015-12-16 NOTE — Progress Notes (Signed)
Pt was extubated. Calm, cooperative, VS stable. Wife called and updated.

## 2015-12-16 NOTE — Evaluation (Signed)
Clinical/Bedside Swallow Evaluation Patient Details  Name: Juan BernheimJames K Loughridge MRN: [id number] Date of Birth: 1950/04/26  Today's Date: 12/16/2015 Time: SLP Start Time (ACUTE ONLY): 1455 SLP Stop Time (ACUTE ONLY): 1510 SLP Time Calculation (min) (ACUTE ONLY): 15 min  Past Medical History:  Past Medical History:  Diagnosis Date  . Alcoholism (HCC)   . Allergy    seasonal   . Anxiety   . Asthma   . CHF (congestive heart failure) (HCC) 10/2010   ECHO:  EF 40%, Grade II diastolic dysfunction  . COPD (chronic obstructive pulmonary disease) (HCC)   . Depression   . Headache   . Hyperlipidemia   . Hypertension   . Seizures (HCC)    Past Surgical History:  Past Surgical History:  Procedure Laterality Date  . TOENAIL EXCISION Right 1967   Removal of ingrown toenail [Other]   HPI:  Pt is a 65 y.o. male with PMH of alcoholism, COPD (not on oxygen), tobacco use disorder, atrial fibrillation (not on anticoagulation), diastolic heart failure and hypertension who presented to ED 11/9 due to dyspnea. Pt was intubated 11/10 to 11/11. Pt had worsening dyspnea, nausea and poor appetite for the past 3 weeks and hada productive cough with whitish/ yellowish phlegm. CXR showed faint pneumonic infiltrates in the R upper/ lower lobes. Bedside swallow eval ordered post-extubation.   Assessment / Plan / Recommendation Clinical Impression  Pt showed no overt s/s of aspiration at bedside. Voice quality was WNL. Pt is at an increased risk of aspiration given current respiratory status. Pt also requested "soft foods" due to missing dentition. Recommend initiating dysphagia 3 diet, thin liquids, meds whole with liquid, intermittent supervision to ensure pt upright for meals, cue small bites/ sips. SLP will sign off at this time; please re-consult if needs arise.    Aspiration Risk  Mild aspiration risk    Diet Recommendation Dysphagia 3 (Mech soft);Thin liquid   Liquid Administration via:  Cup;Straw Medication Administration: Whole meds with liquid Supervision: Patient able to self feed;Intermittent supervision to cue for compensatory strategies Compensations: Slow rate;Small sips/bites Postural Changes: Seated upright at 90 degrees    Other  Recommendations Oral Care Recommendations: Oral care BID   Follow up Recommendations None      Frequency and Duration            Prognosis        Swallow Study   General HPI: Pt is a 65 y.o. male with PMH of alcoholism, COPD (not on oxygen), tobacco use disorder, atrial fibrillation (not on anticoagulation), diastolic heart failure and hypertension who presented to ED 11/9 due to dyspnea. Pt was intubated 11/10 to 11/11. Pt had worsening dyspnea, nausea and poor appetite for the past 3 weeks and hada productive cough with whitish/ yellowish phlegm. CXR showed faint pneumonic infiltrates in the R upper/ lower lobes. Bedside swallow eval ordered post-extubation. Type of Study: Bedside Swallow Evaluation Previous Swallow Assessment: none in chart Diet Prior to this Study: NPO Temperature Spikes Noted: No Respiratory Status: Nasal cannula History of Recent Intubation: Yes Length of Intubations (days): 1 days Date extubated: 12/16/15 Behavior/Cognition: Alert;Cooperative;Pleasant mood Oral Cavity Assessment: Within Functional Limits Oral Cavity - Dentition: Missing dentition;Poor condition Vision: Functional for self-feeding Self-Feeding Abilities: Able to feed self Patient Positioning: Upright in bed Baseline Vocal Quality: Normal Volitional Cough: Weak Volitional Swallow: Able to elicit    Oral/Motor/Sensory Function Overall Oral Motor/Sensory Function: Within functional limits   Ice Chips Ice chips: Within functional limits Presentation: Spoon  Thin Liquid Thin Liquid: Within functional limits Presentation: Straw    Nectar Thick Nectar Thick Liquid: Not tested   Honey Thick Honey Thick Liquid: Not tested   Puree  Puree: Within functional limits Presentation: Self Fed;Spoon   Solid   GO   Solid: Within functional limits Presentation: Self Krystal Clark, Amy K, MA, CCC-SLP 12/16/2015,3:15 PM [phone]

## 2015-12-16 NOTE — Procedures (Signed)
Extubation Procedure Note  Patient Details:   Name: Juan [name] DOB: [birth year] MRN: [id number]   Airway Documentation:     Evaluation  O2 sats: stable throughout Complications: No apparent complications Patient did tolerate procedure well. Bilateral Breath Sounds: Diminished   Yes   Patient extubated patient to 4lnc. Vital signs stable at this time. Patient tolerated well. No complications. RN at bedside. RT will continue to monitor.  Ave Filter 12/16/2015, 10:02 AM

## 2015-12-17 DIAGNOSIS — R29898 Other symptoms and signs involving the musculoskeletal system: Secondary | ICD-10-CM

## 2015-12-17 DIAGNOSIS — F1010 Alcohol abuse, uncomplicated: Secondary | ICD-10-CM

## 2015-12-17 LAB — BASIC METABOLIC PANEL
ANION GAP: 8 (ref 5–15)
BUN: 20 mg/dL (ref 6–20)
CHLORIDE: 99 mmol/L — AB (ref 101–111)
CO2: 25 mmol/L (ref 22–32)
Calcium: 8.9 mg/dL (ref 8.9–10.3)
Creatinine, Ser: 0.85 mg/dL (ref 0.61–1.24)
Glucose, Bld: 150 mg/dL — ABNORMAL HIGH (ref 65–99)
POTASSIUM: 4.4 mmol/L (ref 3.5–5.1)
SODIUM: 132 mmol/L — AB (ref 135–145)

## 2015-12-17 LAB — CULTURE, RESPIRATORY
CULTURE: NORMAL
SPECIAL REQUESTS: NORMAL

## 2015-12-17 LAB — CBC
HEMATOCRIT: 35.5 % — AB (ref 39.0–52.0)
Hemoglobin: 12 g/dL — ABNORMAL LOW (ref 13.0–17.0)
MCH: 29.1 pg (ref 26.0–34.0)
MCHC: 33.8 g/dL (ref 30.0–36.0)
MCV: 86.2 fL (ref 78.0–100.0)
PLATELETS: 248 10*3/uL (ref 150–400)
RBC: 4.12 MIL/uL — AB (ref 4.22–5.81)
RDW: 13.3 % (ref 11.5–15.5)
WBC: 9.2 10*3/uL (ref 4.0–10.5)

## 2015-12-17 LAB — MAGNESIUM: Magnesium: 2.4 mg/dL (ref 1.7–2.4)

## 2015-12-17 LAB — GLUCOSE, CAPILLARY: Glucose-Capillary: 131 mg/dL — ABNORMAL HIGH (ref 65–99)

## 2015-12-17 LAB — PHOSPHORUS: PHOSPHORUS: 3.9 mg/dL (ref 2.5–4.6)

## 2015-12-17 MED ORDER — CLONAZEPAM 0.5 MG PO TABS
1.0000 mg | ORAL_TABLET | Freq: Two times a day (BID) | ORAL | Status: DC
  Administered 2015-12-17 – 2015-12-19 (×5): 1 mg via ORAL
  Filled 2015-12-17 (×5): qty 2

## 2015-12-17 MED ORDER — DOCUSATE SODIUM 100 MG PO CAPS
100.0000 mg | ORAL_CAPSULE | Freq: Two times a day (BID) | ORAL | Status: DC | PRN
  Administered 2015-12-18: 100 mg via ORAL
  Filled 2015-12-17 (×2): qty 1

## 2015-12-17 MED ORDER — METHYLPREDNISOLONE SODIUM SUCC 40 MG IJ SOLR
40.0000 mg | Freq: Two times a day (BID) | INTRAMUSCULAR | Status: AC
  Administered 2015-12-17 – 2015-12-18 (×3): 40 mg via INTRAVENOUS
  Filled 2015-12-17 (×4): qty 1

## 2015-12-17 NOTE — Progress Notes (Signed)
Patient came to the floor approx. 1250, alert and oriented, calm denies pain, mild shortness of breath with activities. V/s stable patient wife at bedside. All questions answered.sinus tachy on the monitor. Will continue to monitor the patient.

## 2015-12-17 NOTE — Progress Notes (Signed)
Spoke to MD about pt's and nursing conserves. New orders are placed. Pt alert, oriented, calm and cooperative. Wife at the bedside. Updated by MD. All needed questions were answered.

## 2015-12-17 NOTE — Progress Notes (Signed)
Pt was transported to RM 3E26. Wife is aware. Pt and VS remained stable throughout the transfer.

## 2015-12-17 NOTE — Progress Notes (Signed)
Family Medicine Teaching Service Daily Progress Note Intern Pager: [phone]  Patient name: Juan [name] Medical record number: [id number] Date of birth: 1950/12/18 Age: 65 y.o. Gender: male  Primary Care Provider: Renold Don, MD Consultants: CCM Code Status: Full  Pt Overview and Major Events to Date:  12/14/2015: Admitted for AMS, worsening dyspnea, hyponatremia, was intubated 12/16/2015: Extubated 12/17/2015: FPTS took over patient care  Assessment and Plan: Juan Girard Blackburnis a 65 y.o.male with history of alcoholism, COPD (not on oxygen), tobacco use disorder, atrial fibrillation (not on anticoagulation), diastolic heart failure and hypertension who presented to ED due to a COPD exacerbation.  #COPD Exacerbation: Admitted for COPD exacerbation on 11/9, required intubation.  Treated with solumedrol, received one days of vancomycin and zosyn (11/10).  Leukocytosis 11.8 in the setting of steroid use. - continue day #4 solumedrol 40 mg q6h, will taper to prednisone today (11/10 > 11/14 ) - respiratory cultures unremarkable, respiratory panel negative, urine cultures no growth, blood cultures NG X 2d - continue budesonide, brovana - Duonebs Q4h - cardiac echo ordered - smoking cessation counseling  #Hyponatremia: Na 134 today, improved from 122. - monitor Bmet, UOP (1350 out yesterday, net +600) - replace electrolytes as needed  #HTN  - BP controlled at elevated this morning at 161/38.  No antihypertensive on home med rec. - Consider adding an antihypertensive as needed - continue to monitor  #HFpEF: BNP 500 this admission. Appears euvolemic.   - monitor I&Os  #Atrial fibrillation: Not on anticoagulation CHAD-Vasc 2-3. ?Demand ischemia - monitor on tele - continue ASA 81 mg qd  #Tobacco use - continue cessation counseling  #Alcohol use disorder - Klonopin 1 mg BID, thiamin 100 mg Qd, folic acid 1 mg Qd - alcohol cessation counseling - monitor for withdrawal  symptoms, consider CIWA  FEN/GI: pepcid PRN, colace PRN, diet dysphagia 3 (thin) PPx: lovenox  Disposition: Med-surg 11/13  Subjective:  Satting well on 2L [place], receiving pulmicort and brovana nebulizer. No dyspnea or chest pain. Does endorse congestion and post-nasal drip.  Objective: Temp:  [97.9 F (36.6 C)-99.4 F (37.4 C)] 98.5 F (36.9 C) (11/12 2056) Pulse Rate:  [57-128] 57 (11/12 2056) Resp:  [18-20] 18 (11/12 2056) BP: (106-167)/(59-95) 155/83 (11/12 2056) SpO2:  [92 %-100 %] 96 % (11/12 2056) FiO2 (%):  [30 %] 30 % (11/12 0338) Weight:  [155 lb 3.2 oz (70.4 kg)-156 lb 12 oz (71.1 kg)] 155 lb 3.2 oz (70.4 kg) (11/12 1249) Physical Exam: General: NAD, sits in bed receiving breathing treatment Cardiovascular: RRR, no m/r/g Respiratory: +wheezing in upper and lower lung fields bilaterally, no rales or rhonchi appreciated Abdomen: soft and nontender, nondistended, no HSM, +reducible left-sided hernia, nontender Extremities: warm and well-perfused without edema  Laboratory:  Recent Labs Lab 12/15/15 0809 12/16/15 0521 12/17/15 0232  WBC 6.4 14.3* 9.2  HGB 13.1 12.5* 12.0*  HCT 37.5* 36.0* 35.5*  PLT 260 281 248    Recent Labs Lab 12/14/15 2259  12/15/15 1543 12/16/15 0521 12/17/15 0232  NA 119*  < > 127* 130* 132*  K 4.5  < > 4.7 4.5 4.4  CL 84*  < > 99* 100* 99*  CO2 21*  < > 20* 23 25  BUN 8  < > 13 20 20   CREATININE 0.73  < > 0.80 0.75 0.85  CALCIUM 9.9  < > 8.7* 8.8* 8.9  PROT 8.2*  --   --   --   --   BILITOT 1.0  --   --   --   --  ALKPHOS 63  --   --   --   --   ALT 14*  --   --   --   --   AST 25  --   --   --   --   GLUCOSE 159*  < > 145* 183* 150*  < > = values in this interval not displayed.    Imaging/Diagnostic Tests: Ct Head Wo Contrast 12/15/2015 1. No pulmonary embolus. 2. Improved peribronchovascular opacities of the lingula, with unchanged right upper lobe nodular opacities. The findings may indicate chronic atypical  mycobacterial infection. 3. Aortic and coronary artery atherosclerosis.   Dg Chest Portable 1 View: 12/15/2015 IMPRESSION: Endotracheal tube is slightly low lying at 2.3 cm above the carina. Pullback approximately 2 cm. Gastric tube is seen below the diaphragm. Faint pneumonic infiltrates noted in the right upper and lower lobe.   Dg Chest Portable 1 View: 12/14/2015 IMPRESSION: Patchy opacities in the right upper lobe and left base could relate to early infiltrates. Bronchial wall thickening could relate to inflammation.    Howard PouchLauren Yvonnia Tango, MD 12/17/2015, 11:49 PM PGY-1, [facility] FPTS Intern pager: [phone], text pages welcome

## 2015-12-17 NOTE — Progress Notes (Signed)
PULMONARY / CRITICAL CARE MEDICINE   Name: Juan [name] MRN: [id number] DOB: [birth year]    ADMISSION DATE:  12/14/2015  CHIEF COMPLAINT:  Shortness of breath  BRIEF:   Juan [name] is a 65 y.o. male with history of alcoholism, COPD (not on oxygen), tobacco use disorder, atrial fibrillation (not on anticoagulation), diastolic heart failure and hypertension who presented to ED due to a COPD exacerbation.  SUBJECTIVE:  Extubated yesterday Says brovana helped VITAL SIGNS: BP (!) 152/75   Pulse (!) 128   Temp 98.5 F (36.9 C) (Oral)   Resp 20   Wt 71.1 kg (156 lb 12 oz)   SpO2 93%   BMI 23.83 kg/m   HEMODYNAMICS:    VENTILATOR SETTINGS: FiO2 (%):  [30 %] 30 %  INTAKE / OUTPUT: I/O last 3 completed shifts: In: 1725.6 [P.O.:30; I.V.:825.6; NG/GT:670; IV Piggyback:200] Out: 3620 [Urine:3620]  PHYSICAL EXAMINATION: General: resting comfortably HENT: NCAT OP clear PULM: no wheezing, normal air movement CV: RRR, no mgr GI: BS+, soft, nontender MSK: normal bulk and tone Neuro: Awake, alert, oriented x4  LABS:  BMET  Recent Labs Lab 12/15/15 1543 12/16/15 0521 12/17/15 0232  NA 127* 130* 132*  K 4.7 4.5 4.4  CL 99* 100* 99*  CO2 20* 23 25  BUN 13 20 20   CREATININE 0.80 0.75 0.85  GLUCOSE 145* 183* 150*    Electrolytes  Recent Labs Lab 12/15/15 1543 12/16/15 0521 12/17/15 0232  CALCIUM 8.7* 8.8* 8.9  MG 2.2 2.2 2.4  PHOS 3.7 3.2 3.9    CBC  Recent Labs Lab 12/15/15 0809 12/16/15 0521 12/17/15 0232  WBC 6.4 14.3* 9.2  HGB 13.1 12.5* 12.0*  HCT 37.5* 36.0* 35.5*  PLT 260 281 248    Coag's No results for input(s): APTT, INR in the last 168 hours.  Sepsis Markers  Recent Labs Lab 12/15/15 0053 12/15/15 0328 12/16/15 0521  LATICACIDVEN 1.90  --   --   PROCALCITON  --  <0.10 0.21    ABG  Recent Labs Lab 12/14/15 2348  PHART 7.371  PCO2ART 48.6*  PO2ART 121.0*    Liver Enzymes  Recent Labs Lab 12/14/15 2259  AST  25  ALT 14*  ALKPHOS 63  BILITOT 1.0  ALBUMIN 4.3    Cardiac Enzymes  Recent Labs Lab 12/15/15 0328 12/15/15 1543  TROPONINI 0.18* 0.10*    Glucose  Recent Labs Lab 12/16/15 0346 12/16/15 0756 12/16/15 1158 12/16/15 1602 12/16/15 1946 12/16/15 2328  GLUCAP 211* 122* 138* 131* 190* 131*    Imaging Ct Head Wo Contrast  Result Date: 12/15/2015 1. No pulmonary embolus. 2. Improved peribronchovascular opacities of the lingula, with unchanged right upper lobe nodular opacities. The findings may indicate chronic atypical mycobacterial infection. 3. Aortic and coronary artery atherosclerosis.   Dg Chest Portable 1 View  Result Date: 12/15/2015 IMPRESSION: Endotracheal tube is slightly low lying at 2.3 cm above the carina. Pullback approximately 2 cm. Gastric tube is seen below the diaphragm. Faint pneumonic infiltrates noted in the right upper and lower lobe.   Dg Chest Portable 1 View  Result Date: 12/14/2015 IMPRESSION: Patchy opacities in the right upper lobe and left base could relate to early infiltrates. Bronchial wall thickening could relate to inflammation.    CULTURES: Blood culture 12/15/15 >  Urine culture 12/15/15 Respiratory viral panel in 12/07/15 > negative resp culture 11/10 > GPC in pairs  ANTIBIOTICS: Vancomycin 12/15/15>>> 11/11 Zosyn 12/15/15>>> 11/11  SIGNIFICANT EVENTS: 12/14/15: Presented to ED with  worsening dyspnea, hyponatremia, intubated and had altered mental status 11/10 extubated  LINES/TUBES: ETT 12/15/15 > 11/11 Peripheral IV in 11/90/17  DISCUSSION: Juan [name] is a 65 y.o. male with history of alcoholism, COPD (not on oxygen), tobacco use disorder, atrial fibrillation (not on anticoagulation), diastolic heart failure and hypertension who presents with acute respiratory failure due to a COPD exacerbation.   ASSESSMENT / PLAN:  PULMONARY A: COPD exacerbation. No PFT in the system > improving Tobacco use  disorder Pneumonia - NOT seen on CT or imaging Possible MAI seen on CT chest P:   Wean solumedrol Continue budesonide, brovana Continue bronchodilators as ordered Smoking cessation discussion Will need outpatient pulmonary follow up  CARDIOVASCULAR A: Hypertension Diastolic heart failure: BNP 500 Atrial fibrillation: Not on anticoagulation. CHAD-Vasc 2-3 Demand ischemia P:  Tele KVO fluids F/U echo  RENAL A:   Hyponatremia: improving Hypomag > resolved P: KVO fluids  Monitor BMET and UOP Replace electrolytes as needed  GASTROINTESTINAL A:   dysphagia P:   Pepcid Dysphagia 3 diet   HEMATOLOGIC A:   Mild leukocytosis, dvt prevention P:  Monitor for bleeding DVT prophylaxis  INFECTIOUS A:   COPD exacerbation, no evidence of pneumonia P:   Monitor cultures Monitor for fever  NEUROLOGIC A:   Acute encephalopathy > resolved Alcohol use disorder, at risk for withdrawal Anxiety Deconditioning P:   Continue thiamine, folate Klonopin 1 mg twice a day PT consult    FAMILY  - Updates: wife updated 12/17/2015   - Inter-disciplinary family meet or Palliative Care meeting due by: 12/22/15   Move to TR[facility], Tele   Heber CarolinaBrent McQuaid, MD [facility] Pager: [phone] Cell: [phone] After 3pm or if no response, call [phone]

## 2015-12-18 ENCOUNTER — Telehealth: Payer: Self-pay | Admitting: Family Medicine

## 2015-12-18 ENCOUNTER — Inpatient Hospital Stay (HOSPITAL_COMMUNITY): Payer: Managed Care, Other (non HMO)

## 2015-12-18 DIAGNOSIS — R0600 Dyspnea, unspecified: Secondary | ICD-10-CM

## 2015-12-18 DIAGNOSIS — E440 Moderate protein-calorie malnutrition: Secondary | ICD-10-CM

## 2015-12-18 LAB — ECHOCARDIOGRAM COMPLETE
FS: 24 % — AB (ref 28–44)
Height: 68 in
IV/PV OW: 1.1
LADIAMINDEX: 2.34 cm/m2
LASIZE: 43 mm
LAVOL: 78.4 mL
LAVOLA4C: 72.9 mL
LAVOLIN: 42.6 mL/m2
LDCA: 3.8 cm2
LEFT ATRIUM END SYS DIAM: 43 mm
LV PW d: 11.2 mm — AB (ref 0.6–1.1)
LVOTD: 22 mm
RV LATERAL S' VELOCITY: 13.3 cm/s
TAPSE: 18 mm
Weight: 2507.2 oz

## 2015-12-18 LAB — CBC
HCT: 35.3 % — ABNORMAL LOW (ref 39.0–52.0)
HEMOGLOBIN: 11.9 g/dL — AB (ref 13.0–17.0)
MCH: 29.5 pg (ref 26.0–34.0)
MCHC: 33.7 g/dL (ref 30.0–36.0)
MCV: 87.4 fL (ref 78.0–100.0)
Platelets: 296 10*3/uL (ref 150–400)
RBC: 4.04 MIL/uL — ABNORMAL LOW (ref 4.22–5.81)
RDW: 13.5 % (ref 11.5–15.5)
WBC: 11.8 10*3/uL — AB (ref 4.0–10.5)

## 2015-12-18 LAB — GLUCOSE, CAPILLARY
GLUCOSE-CAPILLARY: 147 mg/dL — AB (ref 65–99)
GLUCOSE-CAPILLARY: 162 mg/dL — AB (ref 65–99)

## 2015-12-18 LAB — BASIC METABOLIC PANEL
ANION GAP: 8 (ref 5–15)
BUN: 24 mg/dL — ABNORMAL HIGH (ref 6–20)
CALCIUM: 8.8 mg/dL — AB (ref 8.9–10.3)
CO2: 26 mmol/L (ref 22–32)
Chloride: 100 mmol/L — ABNORMAL LOW (ref 101–111)
Creatinine, Ser: 0.88 mg/dL (ref 0.61–1.24)
GLUCOSE: 118 mg/dL — AB (ref 65–99)
Potassium: 3.9 mmol/L (ref 3.5–5.1)
SODIUM: 134 mmol/L — AB (ref 135–145)

## 2015-12-18 LAB — MAGNESIUM: MAGNESIUM: 1.8 mg/dL (ref 1.7–2.4)

## 2015-12-18 LAB — PHOSPHORUS: PHOSPHORUS: 4 mg/dL (ref 2.5–4.6)

## 2015-12-18 MED ORDER — PREDNISONE 50 MG PO TABS
50.0000 mg | ORAL_TABLET | Freq: Every day | ORAL | Status: AC
  Administered 2015-12-19: 50 mg via ORAL
  Filled 2015-12-18: qty 1

## 2015-12-18 MED ORDER — ADULT MULTIVITAMIN W/MINERALS CH
1.0000 | ORAL_TABLET | Freq: Every day | ORAL | Status: DC
  Administered 2015-12-19: 1 via ORAL
  Filled 2015-12-18: qty 1

## 2015-12-18 MED ORDER — ENSURE ENLIVE PO LIQD
237.0000 mL | Freq: Two times a day (BID) | ORAL | Status: DC
  Administered 2015-12-19: 237 mL via ORAL

## 2015-12-18 MED ORDER — IPRATROPIUM-ALBUTEROL 0.5-2.5 (3) MG/3ML IN SOLN
3.0000 mL | Freq: Four times a day (QID) | RESPIRATORY_TRACT | Status: DC
  Administered 2015-12-18 – 2015-12-19 (×4): 3 mL via RESPIRATORY_TRACT
  Filled 2015-12-18 (×4): qty 3

## 2015-12-18 MED ORDER — PREDNISONE 50 MG PO TABS: 50.0000 mg | ORAL_TABLET | Freq: Every day | ORAL | Status: DC

## 2015-12-18 MED ORDER — GUAIFENESIN 100 MG/5ML PO SOLN
5.0000 mL | ORAL | Status: DC | PRN
  Administered 2015-12-18 (×3): 100 mg via ORAL
  Filled 2015-12-18 (×3): qty 5

## 2015-12-18 MED ORDER — FOLIC ACID 1 MG PO TABS
1.0000 mg | ORAL_TABLET | Freq: Every day | ORAL | Status: DC
  Administered 2015-12-18 – 2015-12-19 (×2): 1 mg via ORAL
  Filled 2015-12-18 (×2): qty 1

## 2015-12-18 MED ORDER — FAMOTIDINE 20 MG PO TABS
20.0000 mg | ORAL_TABLET | Freq: Two times a day (BID) | ORAL | Status: DC
  Administered 2015-12-18 – 2015-12-19 (×3): 20 mg via ORAL
  Filled 2015-12-18 (×3): qty 1

## 2015-12-18 MED ORDER — VITAMIN B-1 100 MG PO TABS
100.0000 mg | ORAL_TABLET | Freq: Every day | ORAL | Status: DC
  Administered 2015-12-18 – 2015-12-19 (×2): 100 mg via ORAL
  Filled 2015-12-18 (×2): qty 1

## 2015-12-18 NOTE — Progress Notes (Signed)
Nutrition Follow-up  DOCUMENTATION CODES:   Non-severe (moderate) malnutrition in context of chronic illness  INTERVENTION:  Provide Ensure Enlive po BID, each supplement provides 350 kcal and 20 grams of protein Provide Multivitamin with minerals daily  NUTRITION DIAGNOSIS:   Inadequate oral intake related to inability to eat as evidenced by NPO status.  Resolved  GOAL:   Patient will meet greater than or equal to 90% of their needs  progressing  MONITOR:   Vent status, Labs, Weight trends, TF tolerance, Skin  REASON FOR ASSESSMENT:   Consult, Ventilator Enteral/tube feeding initiation and management  ASSESSMENT:   65 y.o.male with history of alcoholism, COPD (not on oxygen), tobacco use disorder, atrial fibrillation, diastolic heart failure and hypertension who presents to ED due to dyspnea. Per patient's wife, patient had worsening dyspnea, nausea and poor appetite for the last 3 weeks.  Pt states that his appetite is good and he is eating 100% of most meals, but has missed some meals due to procedures and ate poorly ate some meals due to food being cold. He reports eating poorly PTA sometimes going several days without eating due to having no appetite. He reports drinking a lot of beer during this time.  RD discussed the importance of nutrition and recommended finding other beverages to consume in place of alcohol. Recommended vitamin/mineral supplements which pt states he has a supply of at home. He is interested in receiving nutritional supplements while admitted. Pt states that he he allergic to aspartame stating that is causes nausea and vomiting.   Labs reviewed.   Diet Order:  DIET DYS 3 Room service appropriate? Yes; Fluid consistency: Thin  Skin:  Reviewed, no issues  Last BM:  11/9  Height:   Ht Readings from Last 1 Encounters:  12/17/15 5\' 8"  (1.727 m)    Weight:   Wt Readings from Last 1 Encounters:  12/18/15 156 lb 11.2 oz (71.1 kg)    Ideal  Body Weight:  70 kg  BMI:  Body mass index is 23.83 kg/m.  Estimated Nutritional Needs:   Kcal:  1900-2100  Protein:  100-115 grams  Fluid:  1.9-2.1 L/day  EDUCATION NEEDS:   No education needs identified at this time  Dorothea Ogle RD, CSP, LDN Inpatient Clinical Dietitian Pager: [phone] After Hours Pager: [phone]

## 2015-12-18 NOTE — Progress Notes (Signed)
  Echocardiogram 2D Echocardiogram has been performed.  Delcie Roch 12/18/2015, 8:35 AM

## 2015-12-18 NOTE — Evaluation (Signed)
Physical Therapy Evaluation Patient Details Name: Juan [name] MRN: [id number] DOB: 1950/03/31 Today's Date: 12/18/2015   History of Present Illness  pt presents with COPD Exacerbation.  pt had been intubated 11/10 - 11/11.  pt with hx of COPD, A-fib, HTN, HF, and Etoh.    Clinical Impression  Pt generally deconditioned and needs encouragement for mobility.  Per pt report it seems as though he is not very active at home and indicates his wife performs all homemaking tasks.  Pt ed on need for continued mobility to maintain strength and independence.  Encouraged pt to start ambulating to bathroom with nsg staff instead of relying on condom cath and staying in bed.  Discussed this with RN.  Will continue to follow while on acute.      Follow Up Recommendations No PT follow up;Supervision - Intermittent    Equipment Recommendations  None recommended by PT    Recommendations for Other Services       Precautions / Restrictions Precautions Precautions: Fall Restrictions Weight Bearing Restrictions: No      Mobility  Bed Mobility Overal bed mobility: Modified Independent                Transfers Overall transfer level: Modified independent Equipment used: None                Ambulation/Gait Ambulation/Gait assistance: Supervision Ambulation Distance (Feet): 200 Feet Assistive device: None Gait Pattern/deviations: Step-through pattern;Decreased stride length     General Gait Details: pt generally moves slowly, but without deficit.  pt's O2 sats remained 91-96% on RA.    Stairs            Wheelchair Mobility    Modified Rankin (Stroke Patients Only)       Balance Overall balance assessment: Needs assistance Sitting-balance support: No upper extremity supported;Feet supported Sitting balance-Leahy Scale: Good     Standing balance support: No upper extremity supported;During functional activity Standing balance-Leahy Scale: Fair                                Pertinent Vitals/Pain Pain Assessment: No/denies pain    Home Living Family/patient expects to be discharged to:: Private residence Living Arrangements: Spouse/significant other Available Help at Discharge: Family;Available 24 hours/day Type of Home: House Home Access: Stairs to enter Entrance Stairs-Rails: None Entrance Stairs-Number of Steps: 2 Home Layout: One level Home Equipment: None      Prior Function Level of Independence: Needs assistance   Gait / Transfers Assistance Needed: pt admits to only walking short distances.    ADL's / Homemaking Assistance Needed: pt states he can perform his own ADLs, but that his wife performs homemaking tasks.          Hand Dominance        Extremity/Trunk Assessment   Upper Extremity Assessment: Overall WFL for tasks assessed           Lower Extremity Assessment: Overall WFL for tasks assessed      Cervical / Trunk Assessment: Kyphotic  Communication   Communication: No difficulties  Cognition Arousal/Alertness: Awake/alert Behavior During Therapy: WFL for tasks assessed/performed Overall Cognitive Status: Within Functional Limits for tasks assessed                      General Comments      Exercises     Assessment/Plan    PT Assessment Patient needs continued PT services  PT Problem List Decreased activity tolerance;Decreased mobility;Decreased balance;Cardiopulmonary status limiting activity          PT Treatment Interventions DME instruction;Gait training;Stair training;Functional mobility training;Therapeutic activities;Therapeutic exercise;Balance training;Patient/family education    PT Goals (Current goals can be found in the Care Plan section)  Acute Rehab PT Goals Patient Stated Goal: Home PT Goal Formulation: With patient Time For Goal Achievement: 12/25/15 Potential to Achieve Goals: Good    Frequency Min 3X/week   Barriers to discharge         Co-evaluation               End of Session Equipment Utilized During Treatment: Gait belt Activity Tolerance: Patient tolerated treatment well Patient left: in bed;with call bell/phone within reach Nurse Communication: Mobility status         Time: 7893-8101 PT Time Calculation (min) (ACUTE ONLY): 19 min   Charges:   PT Evaluation $PT Eval Low Complexity: 1 Procedure     PT G CodesSunny [name], Juan [name] [phone] 12/18/2015, 2:50 PM

## 2015-12-18 NOTE — Progress Notes (Signed)
eLink Physician-Brief Progress Note Patient Name: Juan [name] DOB: 1950/10/14 MRN: [id number]   Date of Service  12/18/2015  HPI/Events of Note  Pt requesting cough meds  eICU Interventions  Will order robitussin prn     Intervention Category Minor Interventions: Other:  Daneen Schick Dios 12/18/2015, 12:40 AM

## 2015-12-18 NOTE — Progress Notes (Addendum)
Pt.was coughing and having trouble to expectorate thick mucus. Heart rate went up for few minutes up to 140's. MD notified and ordered PRN Robitussin. No more complaints. Patient is resting comfortably. Will continue to monitor.  Josslynn Mentzer, RN

## 2015-12-18 NOTE — Progress Notes (Signed)
Patient ambulatory entire length of unit with RN.  Mildly short of breath when heading back to room, Oxygen saturation 94% on room air.

## 2015-12-18 NOTE — Telephone Encounter (Signed)
Pt wanted to let Dr. Gwendolyn Grant know he had been in the hospital since last week. Pt had a respiratory seizure and is scheduled to go home Wednesday. Pt would like it if Dr. Gwendolyn Grant had a few minutes to stop by and see him. Please advise. Thanks! ep

## 2015-12-19 ENCOUNTER — Ambulatory Visit: Payer: Self-pay | Admitting: Family Medicine

## 2015-12-19 DIAGNOSIS — J9620 Acute and chronic respiratory failure, unspecified whether with hypoxia or hypercapnia: Secondary | ICD-10-CM

## 2015-12-19 LAB — CBC
HEMATOCRIT: 36.7 % — AB (ref 39.0–52.0)
HEMOGLOBIN: 12.4 g/dL — AB (ref 13.0–17.0)
MCH: 29.5 pg (ref 26.0–34.0)
MCHC: 33.8 g/dL (ref 30.0–36.0)
MCV: 87.4 fL (ref 78.0–100.0)
Platelets: 290 10*3/uL (ref 150–400)
RBC: 4.2 MIL/uL — AB (ref 4.22–5.81)
RDW: 13.3 % (ref 11.5–15.5)
WBC: 8.5 10*3/uL (ref 4.0–10.5)

## 2015-12-19 LAB — BASIC METABOLIC PANEL
ANION GAP: 7 (ref 5–15)
BUN: 18 mg/dL (ref 6–20)
CHLORIDE: 99 mmol/L — AB (ref 101–111)
CO2: 28 mmol/L (ref 22–32)
Calcium: 8.7 mg/dL — ABNORMAL LOW (ref 8.9–10.3)
Creatinine, Ser: 0.74 mg/dL (ref 0.61–1.24)
GFR calc Af Amer: >60 mL/min (ref >60)
GFR calc non Af Amer: >60 mL/min (ref >60)
GLUCOSE: 114 mg/dL — AB (ref 65–99)
POTASSIUM: 3.9 mmol/L (ref 3.5–5.1)
Sodium: 134 mmol/L — ABNORMAL LOW (ref 135–145)

## 2015-12-19 LAB — PHOSPHORUS: PHOSPHORUS: 3.6 mg/dL (ref 2.5–4.6)

## 2015-12-19 LAB — MAGNESIUM: MAGNESIUM: 1.8 mg/dL (ref 1.7–2.4)

## 2015-12-19 MED ORDER — ARFORMOTEROL TARTRATE 15 MCG/2ML IN NEBU: 15.0000 ug | INHALATION_SOLUTION | Freq: Two times a day (BID) | RESPIRATORY_TRACT | 5 refills | Status: DC

## 2015-12-19 MED ORDER — BUDESONIDE 0.5 MG/2ML IN SUSP: 0.5000 mg | Freq: Two times a day (BID) | RESPIRATORY_TRACT | 12 refills | Status: DC

## 2015-12-19 MED ORDER — CLONAZEPAM 0.5 MG PO TABS: ORAL_TABLET | ORAL | 0 refills | Status: DC

## 2015-12-19 MED ORDER — ASPIRIN 81 MG PO CHEW: 81.0000 mg | CHEWABLE_TABLET | Freq: Every day | ORAL | 0 refills | Status: AC

## 2015-12-19 NOTE — Progress Notes (Signed)
Family Medicine Teaching Service Daily Progress Note Intern Pager: [phone]  Patient name: Juan [name] Medical record number: [id number] Date of birth: 1950/10/21 Age: 65 y.o. Gender: male  Primary Care Provider: Renold Don, MD Consultants: CCM Code Status: Full  Pt Overview and Major Events to Date:  12/14/2015: Admitted for AMS, worsening dyspnea, hyponatremia, was intubated 12/16/2015: Extubated 12/17/2015: FPTS took over patient care  Assessment and Plan: Juan [name] a 64 y.o.male with history of alcoholism, COPD (not on oxygen), tobacco use disorder, atrial fibrillation (not on anticoagulation), diastolic heart failure and hypertension who presented to ED due to a COPD exacerbation.  COPD Exacerbation: Admitted for COPD exacerbation on 11/9, required intubation.  Treated with solumedrol, received one days of vancomycin and zosyn (11/10).  Leukocytosis 11.8 in the setting of steroid use. - cardiac echo EF 50-55%, LVEF low-normal, akinesis of basal inferior wall, no change from 11/16/2014 - S/p 4 days of steroids, final day of oral pred today (11/10 > 11/14 ) - continue budesonide, brovana - Duonebs Q4h - smoking cessation counseling - will discharge with brovana and pulmicort nebs  Hyponatremia, Improved: Na 134 today. Mg 1.8, Phos 3.6 - monitor Bmet - replace electrolytes as needed  #HTN  - BP mildly elevated at 147/96.  No antihypertensive on home med rec. - Consider adding an antihypertensive as needed - continue to monitor  #HFpEF: BNP 500 this admission. Appears euvolemic.   - monitor I&Os  #Atrial fibrillation: Not on anticoagulation CHAD-Vasc 2-3. ?Demand ischemia - monitor on tele - continue ASA 81 mg qd  #Tobacco use - continue cessation counseling  #Alcohol use disorder - Klonopin 1 mg BID, thiamin 100 mg Qd, folic acid 1 mg Qd - alcohol cessation counseling - CIWA  FEN/GI: pepcid PRN, colace PRN, diet dysphagia 3 (thin) PPx:  lovenox  Disposition: Home, likely today  Subjective:  Patient feeling well this morning. Primary concern is URI with congestino  Objective: Temp:  [98 F (36.7 C)-99.2 F (37.3 C)] 98 F (36.7 C) (11/14 0640) Pulse Rate:  [99-115] 106 (11/14 0640) Resp:  [18-20] 18 (11/14 0640) BP: (139-157)/(78-103) 147/96 (11/14 0640) SpO2:  [95 %-100 %] 96 % (11/14 0757) Weight:  [155 lb 4.8 oz (70.4 kg)] 155 lb 4.8 oz (70.4 kg) (11/14 0640) Physical Exam: General: NAD, rests comfortably in bed, eating breakfast Cardiovascular: RRR, no m/r/g Respiratory: No rales, no wheezes, +mild rhonchi left middle lung field Abdomen: soft and nontender, +BS, no HSM Extremities: no edema LE bilaterally  Laboratory:  Blood cultures - NG x 3d  Recent Labs Lab 12/17/15 0232 12/18/15 0331 12/19/15 0316  WBC 9.2 11.8* 8.5  HGB 12.0* 11.9* 12.4*  HCT 35.5* 35.3* 36.7*  PLT 248 296 290    Recent Labs Lab 12/14/15 2259  12/17/15 0232 12/18/15 0331 12/19/15 0316  NA 119*  < > 132* 134* 134*  K 4.5  < > 4.4 3.9 3.9  CL 84*  < > 99* 100* 99*  CO2 21*  < > 25 26 28   BUN 8  < > 20 24* 18  CREATININE 0.73  < > 0.85 0.88 0.74  CALCIUM 9.9  < > 8.9 8.8* 8.7*  PROT 8.2*  --   --   --   --   BILITOT 1.0  --   --   --   --   ALKPHOS 63  --   --   --   --   ALT 14*  --   --   --   --  AST 25  --   --   --   --   GLUCOSE 159*  < > 150* 118* 114*  < > = values in this interval not displayed.    Imaging/Diagnostic Tests: Ct Head Wo Contrast 12/15/2015 1. No pulmonary embolus. 2. Improved peribronchovascular opacities of the lingula, with unchanged right upper lobe nodular opacities. The findings may indicate chronic atypical mycobacterial infection. 3. Aortic and coronary artery atherosclerosis.   Dg Chest Portable 1 View: 12/15/2015 IMPRESSION: Endotracheal tube is slightly low lying at 2.3 cm above the carina. Pullback approximately 2 cm. Gastric tube is seen below the diaphragm. Faint pneumonic  infiltrates noted in the right upper and lower lobe.   Dg Chest Portable 1 View: 12/14/2015 IMPRESSION: Patchy opacities in the right upper lobe and left base could relate to early infiltrates. Bronchial wall thickening could relate to inflammation.   Cardiac Echo 12/18/2015 - Left ventricle: The cavity size was normal. There was mild   concentric hypertrophy. Systolic function was normal. The   estimated ejection fraction was in the range of 50% to 55%.   Akinesis of the basal inferior wall. The study is not technically   sufficient to allow evaluation of LV diastolic function. - Aortic valve: Trileaflet; normal thickness leaflets. There was no   regurgitation. - Aortic root: The aortic root was normal in size. - Mitral valve: Structurally normal valve. There was no   regurgitation. - Left atrium: The atrium was mildly dilated. - Right ventricle: Systolic function was normal. - Tricuspid valve: There was no regurgitation. - Pulmonary arteries: Systolic pressure could not be accurately   estimated. - Inferior vena cava: The vessel was normal in size. - Pericardium, extracardiac: There was no pericardial effusion.  IMPRESSION: LVEF low normal, akinesis of the basal inferior wall. No chenge   from the prior study on 11/16/2014.  Howard PouchLauren Elly Haffey, MD 12/19/2015, 9:12 AM PGY-1, [facility] FPTS Intern pager: [phone], text pages welcome

## 2015-12-19 NOTE — Progress Notes (Signed)
RN notified from [address] that patient had a 7 beat run of V tach, RN confirmed on monitor.  Patient asymptomatic.  Text page sent to night coverage for Family Medicine.

## 2015-12-19 NOTE — Plan of Care (Signed)
Problem: Activity: Goal: Risk for activity intolerance will decrease Outcome: Progressing Patient able to walk in room and the length of the hall before getting minimally short of breath, maintained sats in mid to high 90's without oxygen.   Problem: Bowel/Gastric: Goal: Will not experience complications related to bowel motility Outcome: Progressing Patient receiving Colace, defers any other intervention at this time.

## 2015-12-19 NOTE — Progress Notes (Signed)
Pt has orders to be discharged. Discharge instructions given and pt has no additional questions at this time. Medication regimen reviewed with patient and wife. Pt and wife verbalized understanding and has no additional questions. Telemetry box removed. IV removed and site in good condition. Pt stable and waiting for transportation.

## 2015-12-19 NOTE — Discharge Summary (Signed)
Family Medicine [facility] Discharge Summary  Patient name: Juan [name] Medical record number: [id number] Date of birth: [birth year] Age: 65 y.o. Gender: male Date of Admission: 12/14/2015  Date of Discharge: 12/19/2015 Admitting Physician: Louann Sjogren, MD  Primary Care Provider: Renold Don, MD Consultants: None  Indication for Hospitalization: COPD/Acute on chronic respiratory failure  Discharge Diagnoses/Problem List:  Patient Active Problem List   Diagnosis Date Noted  . Malnutrition of moderate degree 12/18/2015  . Respiratory failure (HCC) 12/15/2015  . Acute respiratory failure with hypoxia (HCC)   . Acidosis   . Lactic acidosis 12/29/2014  . Alcoholic ketoacidosis   . History of recent fall   . COPD exacerbation (HCC)   . Dyspnea 12/16/2014  . Alcoholism (HCC) 11/14/2014  . Atrial fibrillation with rapid ventricular response (HCC) 11/14/2014  . Sinusitis, chronic 08/05/2014  . Fall   . Weakness   . Cough   . Tachycardia 02/12/2014  . Hypokalemia 01/26/2014  . Chronic diastolic heart failure (HCC)   . Peripheral neuropathy (HCC) 12/15/2013  . Community acquired pneumonia 11/18/2013  . Bronchiectasis without acute exacerbation (HCC) 06/30/2013  . Lung nodule seen on imaging study 06/15/2013  . Transaminitis 06/13/2013  . RBBB 11/09/2012  . Hyponatremia 11/08/2012  . Umbilical hernia 08/06/2012  . Smoker unmotivated to quit 07/22/2012  . Tremor 07/30/2011  . HTN (hypertension) 07/30/2011  . Hepatomegaly 11/05/2010  . Depression 11/05/2010  . Anxiety 11/05/2010     Disposition: Home  Discharge Condition: Stable  Discharge Exam:  Temp:  [98 F (36.7 C)-99.2 F (37.3 C)] 98 F (36.7 C) (11/14 0640) Pulse Rate:  [99-115] 106 (11/14 0640) Resp:  [18-20] 18 (11/14 0640) BP: (139-157)/(78-103) 147/96 (11/14 0640) SpO2:  [95 %-100 %] 96 % (11/14 0757) Weight:  [155 lb 4.8 oz (70.4 kg)] 155 lb 4.8 oz (70.4 kg) (11/14  0640) Physical Exam: General: NAD, rests comfortably in bed, eating breakfast Cardiovascular: RRR, no m/r/g Respiratory: No rales, no wheezes, +mild rhonchi left middle lung field Abdomen: soft and nontender, +BS, no HSM Extremities: no edema LE bilaterally  Brief Hospital Course:  Patient is a 65 year old male with history of alcoholism, COPD (not on home oxygen), tobacco use, atrial fibrillation (not on anticoagulation) diastolic heart failure and hypertension who presented to ED due to COPD exacerbation. In the ED he was found to have  altered mental status, worsening dyspnea, and hyponatremia.  He required intubation and was admitted to the ICU on 12/14/2015.    On 11/11 the patient tolerated extubation well and was transferred back to the general medical floor.  Overall he was treated IV steroids until stable, transitioned to oral prednisone to complete a 5 day course. His blood cultures, respiratory panel and urine cultures were all negative. He was treated with budesonide, brovana, duonebs every four hours.  He improved on this regimen and was weaned off of oxygen. His hyponatremia improved with IVF. He underwent cardiac echo. (results below - change from prior).   At the time of discharge the patient was stable for over 24 hours on room air, was able to ambulate, and no dyspnea.  He was considered stable for discharge.  Issues for Follow Up:  1. Pt was discharged home with Klonopin 0.5mg  #15. Please re-evaluate appropriateness for further refills at his follow-up appointment. 2. Patient was discharged on brovana and pulmicort nebulizers. Combivent was stopped.  Significant Procedures: Cardiac Echo  Significant Labs and Imaging:  Ct Head Wo Contrast11/11/2015 1. No  pulmonary embolus. 2. Improved peribronchovascular opacities of the lingula, with unchanged right upper lobe nodular opacities. The findings may indicate chronic atypical mycobacterial infection. 3. Aortic and coronary artery  atherosclerosis.   Dg Chest Portable 1 View:12/15/2015 IMPRESSION: Endotracheal tube is slightly low lying at 2.3 cm above the carina. Pullback approximately 2 cm. Gastric tube is seen below the diaphragm. Faint pneumonic infiltrates noted in the right upper and lower lobe.   Dg Chest Portable 1 View: 12/14/2015 IMPRESSION: Patchy opacities in the right upper lobe and left base could relate to early infiltrates. Bronchial wall thickening could relate to inflammation.   Cardiac Echo 12/18/2015 - Left ventricle: The cavity size was normal. There was mild concentric hypertrophy. Systolic function was normal. The estimated ejection fraction was in the range of 50% to 55%. Akinesis of the basal inferior wall. The study is not technically sufficient to allow evaluation of LV diastolic function. - Aortic valve: Trileaflet; normal thickness leaflets. There was no regurgitation. - Aortic root: The aortic root was normal in size. - Mitral valve: Structurally normal valve. There was no regurgitation. - Left atrium: The atrium was mildly dilated. - Right ventricle: Systolic function was normal. - Tricuspid valve: There was no regurgitation. - Pulmonary arteries: Systolic pressure could not be accurately estimated. - Inferior vena cava: The vessel was normal in size. - Pericardium, extracardiac: There was no pericardial effusion.  IMPRESSION: LVEF low normal, akinesis of the basal inferior wall. No change from the prior study on 11/16/2014.     Recent Labs Lab 12/17/15 0232 12/18/15 0331 12/19/15 0316  WBC 9.2 11.8* 8.5  HGB 12.0* 11.9* 12.4*  HCT 35.5* 35.3* 36.7*  PLT 248 296 290    Recent Labs Lab 12/14/15 2259  12/15/15 1543 12/16/15 0521 12/17/15 0232 12/18/15 0331 12/19/15 0316  NA 119*  < > 127* 130* 132* 134* 134*  K 4.5  < > 4.7 4.5 4.4 3.9 3.9  CL 84*  < > 99* 100* 99* 100* 99*  CO2 21*  < > 20* 23 25 26 28   GLUCOSE 159*  < > 145* 183* 150* 118*  114*  BUN 8  < > 13 20 20  24* 18  CREATININE 0.73  < > 0.80 0.75 0.85 0.88 0.74  CALCIUM 9.9  < > 8.7* 8.8* 8.9 8.8* 8.7*  MG  --   < > 2.2 2.2 2.4 1.8 1.8  PHOS  --   < > 3.7 3.2 3.9 4.0 3.6  ALKPHOS 63  --   --   --   --   --   --   AST 25  --   --   --   --   --   --   ALT 14*  --   --   --   --   --   --   ALBUMIN 4.3  --   --   --   --   --   --   < > = values in this interval not displayed.    Results/Tests Pending at Time of Discharge: None  Discharge Medications:    Medication List    STOP taking these medications   Ipratropium-Albuterol 20-100 MCG/ACT Aers respimat Commonly known as:  COMBIVENT RESPIMAT     TAKE these medications   acetaminophen 325 MG tablet Commonly known as:  TYLENOL Take 2 tablets (650 mg total) by mouth every 6 (six) hours as needed for moderate pain.   albuterol (2.5 MG/3ML) 0.083% nebulizer solution Commonly  known as:  PROVENTIL INHALE 1 VIAL VIA NEBULIZER EVERY 6 HOURS AS NEEDED FOR WHEEZING OR SHORTNESS OF BREATH   arformoterol 15 MCG/2ML Nebu Commonly known as:  BROVANA Take 2 mLs (15 mcg total) by nebulization 2 (two) times daily.   aspirin 81 MG chewable tablet Chew 1 tablet (81 mg total) by mouth daily. Start taking on:  12/20/2015   budesonide 0.5 MG/2ML nebulizer solution Commonly known as:  PULMICORT Take 2 mLs (0.5 mg total) by nebulization 2 (two) times daily.   clonazePAM 0.5 MG tablet Commonly known as:  KLONOPIN TAKE 1 TAB BY MOUTH TWICE DAILY AS NEEDED FOR ANXIETY What changed:  See the new instructions.   hydroxypropyl methylcellulose / hypromellose 2.5 % ophthalmic solution Commonly known as:  ISOPTO TEARS / GONIOVISC Place 1 drop into both eyes 3 (three) times daily as needed for dry eyes.   multivitamin with minerals Tabs tablet Take 1 tablet by mouth daily.       Discharge Instructions: Please refer to Patient Instructions section of EMR for full details.  Patient was counseled important signs and  symptoms that should prompt return to medical care, changes in medications, dietary instructions, activity restrictions, and follow up appointments.   Follow-Up Appointments: Follow-up Information    Howard PouchLauren Thayer Embleton, MD Follow up on 12/25/2015.   Why:  3 PM for hospital follow up appointment. Please arrive by 2:45. Contact information: [address] [place] [address] [phone]           Howard PouchLauren Ashar Lewinski, MD 12/19/2015, 9:42 PM PGY-1, [facility]

## 2015-12-19 NOTE — Progress Notes (Signed)
Patient appeared to have slept well during 7 p to 7 a shift after receiving nighttime meds around 2200.  Did walk in the hall prior as above note mentions without complaint.

## 2015-12-19 NOTE — Discharge Instructions (Signed)
You were hospitalized with a COPD exacerbation, treated with antibiotics and steroids in the hospital. You will be discharged with nebulizers called Brovana and Pulmicort to use twice daily at home.   Please follow up with [place] in the family medicine center clinic for your visit scheduled on Monday.    Chronic Obstructive Pulmonary Disease Chronic obstructive pulmonary disease (COPD) is a common lung problem. In COPD, the flow of air from the lungs is limited. The way your lungs work will probably never return to normal, but there are things you can do to improve your lungs and make yourself feel better. Your doctor may treat your condition with:  Medicines.  Oxygen.  Lung surgery.  Changes to your diet.  Rehabilitation. This may involve a team of specialists. Follow these instructions at home:  Take all medicines as told by your doctor.  Avoid medicines or cough syrups that dry up your airway (such as antihistamines) and do not allow you to get rid of thick spit. You do not need to avoid them if told differently by your doctor.  If you smoke, stop. Smoking makes the problem worse.  Avoid being around things that make your breathing worse (like smoke, chemicals, and fumes).  Use oxygen therapy and therapy to help improve your lungs (pulmonary rehabilitation) if told by your doctor. If you need home oxygen therapy, ask your doctor if you should buy a tool to measure your oxygen level (oximeter).  Avoid people who have a sickness you can catch (contagious).  Avoid going outside when it is very hot, cold, or humid.  Eat healthy foods. Eat smaller meals more often. Rest before meals.  Stay active, but remember to also rest.  Make sure to get all the shots (vaccines) your doctor recommends. Ask your doctor if you need a pneumonia shot.  Learn and use tips on how to relax.  Learn and use tips on how to control your breathing as told by your doctor. Try: 1. Breathing in (inhaling)  through your nose for 1 second. Then, pucker your lips and breath out (exhale) through your lips for 2 seconds. 2. Putting one hand on your belly (abdomen). Breathe in slowly through your nose for 1 second. Your hand on your belly should move out. Pucker your lips and breathe out slowly through your lips. Your hand on your belly should move in as you breathe out.  Learn and use controlled coughing to clear thick spit from your lungs. The steps are: 1. Lean your head a little forward. 2. Breathe in deeply. 3. Try to hold your breath for 3 seconds. 4. Keep your mouth slightly open while coughing 2 times. 5. Spit any thick spit out into a tissue. 6. Rest and do the steps again 1 or 2 times as needed. Contact a doctor if:  You cough up more thick spit than usual.  There is a change in the color or thickness of the spit.  It is harder to breathe than usual.  Your breathing is faster than usual. Get help right away if:  You have shortness of breath while resting.  You have shortness of breath that stops you from:  Being able to talk.  Doing normal activities.  You chest hurts for longer than 5 minutes.  Your skin color is more blue than usual.  Your pulse oximeter shows that you have low oxygen for longer than 5 minutes. This information is not intended to replace advice given to you by your health care  provider. Make sure you discuss any questions you have with your health care provider. Document Released: 07/10/2007 Document Revised: 06/29/2015 Document Reviewed: 09/17/2012 Elsevier Interactive Patient Education  2017 [employer]

## 2015-12-20 ENCOUNTER — Other Ambulatory Visit: Payer: Self-pay | Admitting: Family Medicine

## 2015-12-20 ENCOUNTER — Other Ambulatory Visit: Payer: Self-pay | Admitting: Internal Medicine

## 2015-12-20 LAB — CULTURE, BLOOD (ROUTINE X 2)
CULTURE: NO GROWTH
Culture: NO GROWTH

## 2015-12-20 MED ORDER — ARFORMOTEROL TARTRATE 15 MCG/2ML IN NEBU: 15.0000 ug | INHALATION_SOLUTION | Freq: Two times a day (BID) | RESPIRATORY_TRACT | 2 refills | Status: DC

## 2015-12-20 MED ORDER — ALBUTEROL SULFATE (2.5 MG/3ML) 0.083% IN NEBU: INHALATION_SOLUTION | RESPIRATORY_TRACT | 1 refills | Status: DC

## 2015-12-20 NOTE — Telephone Encounter (Signed)
Pt needs a refill on Albuterol, pt now uses [facility] on [address]. Please advise. Thanks! ep

## 2015-12-20 NOTE — Telephone Encounter (Signed)
Tried to go by and see patient yesterday, but he had already been discharged.  I am precepting the day he is following up.  I have already discussed this with Dr Mosetta Putt, who will be the one seeing the patient in clinic.  I will be able to meet and talk with Juan [name] then.

## 2015-12-20 NOTE — Progress Notes (Signed)
Received a fax from [facility] that patient's insurance will only cover a 90 day supply of Brovana. On discharge from the hospital, I prescribed a 6 month supply. [facility] prescription [facility]ed to [facility] for [facility] day supply.  Willadean Carol, MD PGY-2

## 2015-12-25 ENCOUNTER — Ambulatory Visit (INDEPENDENT_AMBULATORY_CARE_PROVIDER_SITE_OTHER): Payer: Managed Care, Other (non HMO) | Admitting: Student in an Organized Health Care Education/Training Program

## 2015-12-25 ENCOUNTER — Other Ambulatory Visit: Payer: Self-pay | Admitting: *Deleted

## 2015-12-25 ENCOUNTER — Encounter: Payer: Self-pay | Admitting: Student in an Organized Health Care Education/Training Program

## 2015-12-25 VITALS — BP 155/86 | HR 95 | Temp 98.4°F | Ht 68.0 in | Wt 157.0 lb

## 2015-12-25 DIAGNOSIS — F411 Generalized anxiety disorder: Secondary | ICD-10-CM | POA: Diagnosis not present

## 2015-12-25 DIAGNOSIS — Z09 Encounter for follow-up examination after completed treatment for conditions other than malignant neoplasm: Secondary | ICD-10-CM | POA: Diagnosis not present

## 2015-12-25 DIAGNOSIS — Z23 Encounter for immunization: Secondary | ICD-10-CM | POA: Diagnosis not present

## 2015-12-25 MED ORDER — CLONAZEPAM 0.5 MG PO TABS: ORAL_TABLET | ORAL | 0 refills | Status: DC

## 2015-12-25 MED ORDER — TIOTROPIUM BROMIDE MONOHYDRATE 18 MCG IN CAPS: 18.0000 ug | ORAL_CAPSULE | Freq: Every day | RESPIRATORY_TRACT | 12 refills | Status: DC

## 2015-12-25 MED ORDER — IPRATROPIUM-ALBUTEROL 18-103 MCG/ACT IN AERO: 2.0000 | INHALATION_SPRAY | Freq: Four times a day (QID) | RESPIRATORY_TRACT | 2 refills | Status: DC | PRN

## 2015-12-25 NOTE — Patient Instructions (Addendum)
It was a pleasure seeing you today in our clinic. Today we discussed your recent hospitalization. Here is the treatment plan we have discussed and agreed upon together:  For your anxiety: - Please continue klonipin (refilled) and follow up with Dr. Gwendolyn Grant in 2 weeks.  For your chronic lung disease: - Spiriva inhaler - a controller medication which is used every day, even when you are feeling well - continue pulmicort and brovana nebulizers as previously prescribed (twice daily) - continue combivent as you previously used it - up to 4 times daily as needed (I renewed this medication at this visit) - albuterol as needed - Please follow up with Dr. Gwendolyn Grant in 2 weeks. Please schedule this visit prior to leaving the office today.  Our clinic's number is [phone]. Please call with questions or concerns about what we discussed today.  Be well, Dr. Mosetta Putt

## 2015-12-25 NOTE — Patient Outreach (Signed)
[facility]) Care Management  12/25/2015  KIZER GOSHA [birth year] [id number]  Per [employer] RNCM Annitta Needs), patient has been referred to [employer]'s internal CM program, no need to outreach to patient.   Case closed due to other CM program and notification sent to Iverson Alamin at [facility] Care Management.    Kadeshia Kasparian H. Gardiner Barefoot, BSN, CCM [facility] Care Management [facility] Telephonic CM Phone: [phone] Fax: [phone]

## 2015-12-25 NOTE — Progress Notes (Signed)
  HPI: Juan [name] presents for hospital follow up. Patient was hospitalized from 12/14/2015 to 12/19/2015 with Acute on chronic respiratory failure.    COPD - patient discharged on brovana and pulmicort nebulizers.  Previous combivent was discontinued.  - Patient now reports his breathing is near his previous baseline prior to hospitalization.   - He is having occasional shortness of breath episodes palliated with albuterol - currently taking brovana and pulmicort nebulizers twice daily with albuterol - also takes albuterol an additional 2x daily (4 times daily in total) - also has been using previous combivent inhaler as needed on top of these medications although this was discontinued in the hospital - not on oxygen at home - nonproductive cough, +postanasal drip and congestion, no recent fevers  Alcoholism Klonipin has previously been prescribed to help in the prevention of withdrawal siezures/DTs when patient chooses to discontinue alcohol use.  He is at high risk for withdrawal seizures, endorses this occurrence in the past.  Takes klonipin twice daily.  - drinks 4 beers daily - has discontinued hard liquor as of 2 months ago - feels baseline anxiety at all times throughout the day, denies tremors or hallucinations  ROS: See HPI.  PMFSH: Drinks 2 beers two times daily. Has not had hard liquor in 2 months. Lives at home with his wife.  PHYSICAL EXAM: BP (!) 155/86   Pulse 95   Temp 98.4 F (36.9 C) (Oral)   Ht 5\' 8"  (1.727 m)   Wt 71.2 kg (157 lb)   BMI 23.87 kg/m  Gen: NAD, sits comfortably in chair HEENT: NCAT, no pharyngeal erythema or exudate Heart: RRR, no m/r/g Lungs: CTA bil, moving good air, speaking in full sentences, no W/R/R Neuro: CN II-XII grossly intact  ASSESSMENT/PLAN:  COPD exacerbation (HCC) Resolved.  Lungs clear, patient breathing normally.  Plan going forward: - start spiriva qd - continue pulmicort, brovana nebs BID - continue previous  combivent up to 4x daily - albuterol PRN - f/u with PCP Dr. Gwendolyn Grant within 2 weeks (scheduled appt for 12/5)  Alcoholism [facility]) History of withdrawal seizures.  Has cut back on alcohol use, now endorses only 4 beers daily.  Wife in the room confirms. Anxiety and high risk for withdrawal is managed on Klonipin. Previously 1 mg BID, dec to 0.5 BID in recent hospitalization. - will refill klonipin 0.5 mg BID PRN anxiety - patient to follow up with PCP in 2 weeks (scheduled for 12/5) - can consider switching to a different anti-anxiolytic if patient is able to abstain from heavy EtOH use in the future.  For now, discussed w PCP and its best that patient has benzo available for when he chooses to abruptly discontinue EtOH for seizure prevention  FOLLOW UP: Follow up in 2 weeks for continued management of chronic lung disease regimen.  Howard Pouch, MD, MS PGY1 [facility] Family Medicine

## 2015-12-25 NOTE — Assessment & Plan Note (Addendum)
History of withdrawal seizures.  Has cut back on alcohol use, now endorses only 4 beers daily.  Wife in the room confirms. Anxiety and high risk for withdrawal is managed on Klonipin. Previously 1 mg BID, dec to 0.5 BID in recent hospitalization. - will refill klonipin 0.5 mg BID PRN anxiety - patient to follow up with PCP in 2 weeks (scheduled for 12/5) - can consider switching to a different anti-anxiolytic if patient is able to abstain from heavy EtOH use in the future.  For now, discussed w PCP and its best that patient has benzo available for when he chooses to abruptly discontinue EtOH for seizure prevention

## 2015-12-25 NOTE — Assessment & Plan Note (Signed)
Resolved.  Lungs clear, patient breathing normally.  Plan going forward: - start spiriva qd - continue pulmicort, brovana nebs BID - continue previous combivent up to 4x daily - albuterol PRN - f/u with PCP Dr. Gwendolyn Grant within 2 weeks (scheduled appt for 12/5)

## 2016-01-09 ENCOUNTER — Ambulatory Visit: Payer: Self-pay | Admitting: Family Medicine

## 2016-01-11 ENCOUNTER — Other Ambulatory Visit: Payer: Self-pay | Admitting: *Deleted

## 2016-01-11 NOTE — Patient Outreach (Addendum)
[facility]) Care Management  01/11/2016  Juan [name] Apr 05, 1950 [id number]   Subjective: Telephone call to patient's home number, no answer, left HIPAA compliant voicemail message, and requested call back. Case reviewed with [facility] Juan [name]) on 01/03/16.     Objective: Per chart review: Patient hospitalized 12/14/15 -12/19/15 for COPD and acute on chronic respiratory failure.  Patient also has a history of alcoholism, Atrial fibrillation with rapid ventricular response, depression, hypertension, and Malnutrition of moderate degree.     Last visit with primary MD was on 12/25/15.      Assessment: Received [employer] Transition of care referral from [name] RNCM on 01/03/16.  Juan Batten states she will close patient due to unable to contact.   Transition of care follow up pending patient contact.   Plan: RNCM will send case status update to Iverson Alamin at [facility] Care Management.   RNCM will call patient for 2nd telephone outreach attempt, transition of care follow up, within 10 business days, if no return call.    Vrishank Moster H. Gardiner Barefoot, BSN, CCM [facility] Care Management [facility] Telephonic CM Phone: [phone] Fax: [phone]

## 2016-01-12 ENCOUNTER — Other Ambulatory Visit: Payer: Self-pay | Admitting: *Deleted

## 2016-01-12 NOTE — Patient Outreach (Addendum)
[facility]) Care Management  01/12/2016  Juan [name] December 29, 1950 [id number]  Subjective: Telephone call to patient's home number, no answer, left HIPAA compliant voicemail message, and requested call back.     Objective: Per chart review: Patient hospitalized 12/14/15 -12/19/15 for COPD and acute on chronic respiratory failure.  Patient also has a history of alcoholism, Atrial fibrillation with rapid ventricular response, depression, hypertension, and Malnutrition of moderate degree.    Last visit with primary MD was on 12/25/15.      Assessment: Received [employer] Transition of care referral from [name] RNCM on 01/03/16.  [name] states she will close patient due to unable to contact.   Transition of care follow up pending patient contact.   Plan: RNCM will call patient for 3rd telephone outreach attempt, transition of care follow up, within 10 business days, if no return call.    Dyami Umbach H. Gardiner Barefoot, BSN, CCM [facility] Care Management [facility] Telephonic CM Phone: [phone] Fax: [phone]

## 2016-01-15 ENCOUNTER — Ambulatory Visit: Payer: Self-pay | Admitting: *Deleted

## 2016-01-16 ENCOUNTER — Other Ambulatory Visit: Payer: Self-pay | Admitting: *Deleted

## 2016-01-16 ENCOUNTER — Encounter: Payer: Self-pay | Admitting: *Deleted

## 2016-01-16 NOTE — Patient Outreach (Addendum)
[facility]) Care Management  01/16/2016  Juan [name] 14-Nov-1950 [id number]  Subjective: Telephone call to patient's home number, no answer, line busy, and unable to leave message.   Objective: Per chart review:Patient hospitalized 12/14/15 -12/19/15 for COPD and acute on chronic respiratory failure. Patient also has a history of alcoholism, Atrial fibrillation with rapid ventricular response, depression, hypertension, and Malnutrition of moderate degree. Last visit with primary MD was on 12/25/15.    Assessment:Received [employer] Transition of care referral from Juan [name] RNCM on 01/03/16. Juan [name] states she will close patient due to unable to contact. Transition of care follow up pending patient contact.   Plan:RNCM will send patient unsuccessful outreach letter, [facility] pamphlet, and proceed with case closure within 10 business days, if no return call.    Juan [name] H. Gardiner Barefoot, BSN, CCM [facility] Care Management [facility] Telephonic CM Phone: [phone] Fax: [phone]

## 2016-02-06 ENCOUNTER — Other Ambulatory Visit: Payer: Self-pay | Admitting: *Deleted

## 2016-02-06 NOTE — Patient Outreach (Addendum)
[facility]) Care Management  02/06/2016  Juan [name] 11-Nov-1950 [id number]   No response to patient outreach attempts, will proceed with case closure.   Objective: Per chart review:Patient hospitalized 12/14/15 -12/19/15 for COPD and acute on chronic respiratory failure. Patient also has a history of alcoholism, Atrial fibrillation with rapid ventricular response, depression, hypertension, and Malnutrition of moderate degree. Last visit with primary MD was on 12/25/15.    Assessment:Received [employer] Transition of care referral from [name] RNCM on 01/03/16. [name] states she will close patient due to unable to contact. Transition of care follow up not completed, patient unable to contact, and will proceed with case closure.   Plan:RNCM will send case closure due to unable to reach request to Juan [name] at [facility] Care Management.     Daleigh Pollinger H. Gardiner Barefoot, BSN, CCM [facility] Care Management [facility] Telephonic CM Phone: [phone] Fax: [phone]

## 2016-02-13 ENCOUNTER — Ambulatory Visit (INDEPENDENT_AMBULATORY_CARE_PROVIDER_SITE_OTHER): Payer: Managed Care, Other (non HMO) | Admitting: Family Medicine

## 2016-02-13 VITALS — BP 128/78 | HR 89 | Temp 98.1°F | Ht 68.0 in | Wt 169.0 lb

## 2016-02-13 DIAGNOSIS — J479 Bronchiectasis, uncomplicated: Secondary | ICD-10-CM

## 2016-02-13 DIAGNOSIS — K429 Umbilical hernia without obstruction or gangrene: Secondary | ICD-10-CM | POA: Diagnosis not present

## 2016-02-13 DIAGNOSIS — E871 Hypo-osmolality and hyponatremia: Secondary | ICD-10-CM | POA: Diagnosis not present

## 2016-02-13 DIAGNOSIS — F1020 Alcohol dependence, uncomplicated: Secondary | ICD-10-CM | POA: Diagnosis not present

## 2016-02-13 DIAGNOSIS — F419 Anxiety disorder, unspecified: Secondary | ICD-10-CM

## 2016-02-13 LAB — CBC
HCT: 40.3 % (ref 38.5–50.0)
HEMOGLOBIN: 13.6 g/dL (ref 13.2–17.1)
MCH: 29.2 pg (ref 27.0–33.0)
MCHC: 33.7 g/dL (ref 32.0–36.0)
MCV: 86.7 fL (ref 80.0–100.0)
MPV: 9.2 fL (ref 7.5–12.5)
Platelets: 256 10*3/uL (ref 140–400)
RBC: 4.65 MIL/uL (ref 4.20–5.80)
RDW: 13.7 % (ref 11.0–15.0)
WBC: 8.8 10*3/uL (ref 3.8–10.8)

## 2016-02-13 LAB — COMPREHENSIVE METABOLIC PANEL
ALK PHOS: 54 U/L (ref 40–115)
ALT: 8 U/L — AB (ref 9–46)
AST: 15 U/L (ref 10–35)
Albumin: 3.7 g/dL (ref 3.6–5.1)
BILIRUBIN TOTAL: 0.3 mg/dL (ref 0.2–1.2)
BUN: 14 mg/dL (ref 7–25)
CO2: 25 mmol/L (ref 20–31)
CREATININE: 0.98 mg/dL (ref 0.70–1.25)
Calcium: 9 mg/dL (ref 8.6–10.3)
Chloride: 102 mmol/L (ref 98–110)
GLUCOSE: 79 mg/dL (ref 65–99)
POTASSIUM: 4.3 mmol/L (ref 3.5–5.3)
SODIUM: 135 mmol/L (ref 135–146)
Total Protein: 6.9 g/dL (ref 6.1–8.1)

## 2016-02-13 LAB — LIPID PANEL
CHOLESTEROL: 195 mg/dL (ref <200)
HDL: 44 mg/dL (ref >40)
LDL Cholesterol: 138 mg/dL — ABNORMAL HIGH (ref <100)
Total CHOL/HDL Ratio: 4.4 Ratio (ref <5.0)
Triglycerides: 65 mg/dL (ref <150)
VLDL: 13 mg/dL (ref <30)

## 2016-02-13 MED ORDER — IPRATROPIUM-ALBUTEROL 18-103 MCG/ACT IN AERO: 1.0000 | INHALATION_SPRAY | Freq: Four times a day (QID) | RESPIRATORY_TRACT | 6 refills | Status: DC | PRN

## 2016-02-13 MED ORDER — IPRATROPIUM BROMIDE 0.02 % IN SOLN: 0.5000 mg | Freq: Four times a day (QID) | RESPIRATORY_TRACT | 11 refills | Status: DC

## 2016-02-13 MED ORDER — VENLAFAXINE HCL 75 MG PO TABS: 75.0000 mg | ORAL_TABLET | Freq: Two times a day (BID) | ORAL | 1 refills | Status: DC

## 2016-02-13 NOTE — Assessment & Plan Note (Signed)
Checking sodium today. Seems to be worse in the past secondary to alcoholism which is also much improved. We'll recheck in 3-4 weeks after restarting him on his Effexor as well

## 2016-02-13 NOTE — Assessment & Plan Note (Signed)
Currently he is doing well. Evidently he was using his albuterol along with his provocative and Pulmicort scheduled twice daily. This was along with his Combivent. -He was running out of his Combivent before it could be refilled due to insurance reasons. -I will therefore start him on home Atrovent to use rather than Combivent. He should keep the Combivent on his person for use when he is outside of his home.

## 2016-02-13 NOTE — Assessment & Plan Note (Signed)
Is actually much improved. Only drinking beer. On 4 beers a day. This is much better than the point to a fifth of bourbon he does drink daily. His wife is much happier and feels he is much more pleasant at home. He is more awake alert at home as well.

## 2016-02-13 NOTE — Patient Instructions (Signed)
It was good to see you again today.  Take the Effexor twice daily-once in the morning once daily.  You can use the Atrovent nebulized solution with the albuterol to replace the Combivent when you're at home. This is a new medicine.  We are checking labs today.  Come back and see me in 3-4 weeks to recheck your sodium and see how you're doing on the Effexor.

## 2016-02-13 NOTE — Assessment & Plan Note (Signed)
We are continuing his Klonopin for now. However we are going to start him on Effexor to reduce anxiety and also hopefully treat his depression. We are starting at medium dose and may need to increase. Follow-up in 3-4 weeks to assess for improvement.

## 2016-02-13 NOTE — Progress Notes (Signed)
Subjective:    Juan [name] is a 66 y.o. male who presents to [facility] today for anxiety:  1.  Anxiety:  Long-standing anxiety. Patient is been on Klonopin twice a day for years. He has been tried on multiple SSRIs in the past but has trouble with hyponatremia secondary to these. This is exacerbated by long-standing alcoholism.   #2. COPD: He was discharged on provide and Pulmicort nebulizers. No longer taking Combivent. Recent hospitalization in November for acute on chronic respiratory failure. Please see prior office visit notes for details about his alcoholism, atrial fibrillation, hypertension, depression.  3.  Alcoholism:  He is actually much improved from this standpoint. He is down to drinking just 4 beers a day. This is 2 beers at lunch and 2 peers at dinner. His wife is present at the visit and corroborates the story. He is not drinking any other wine or liquor. She states that she is very pleased with this and he has really turned a corner. No further falls. No seizures.   Prev health:  Currently overdue for colonoscopy which she refused today..  ROS as above per HPI.   The following portions of the patient's history were reviewed and updated as appropriate: allergies, current medications, past medical history, family and social history, and problem list. Patient is a nonsmoker.    PMH reviewed.  Past Medical History:  Diagnosis Date  . Alcoholism (HCC)   . Allergy    seasonal   . Anxiety   . Asthma   . CHF (congestive heart failure) (HCC) 10/2010   ECHO:  EF 40%, Grade II diastolic dysfunction  . COPD (chronic obstructive pulmonary disease) (HCC)   . Depression   . Headache   . Hyperlipidemia   . Hypertension   . Seizures (HCC)    Past Surgical History:  Procedure Laterality Date  . TOENAIL EXCISION Right 1967   Removal of ingrown toenail [Other]    Medications reviewed.    Objective:   Physical Exam BP 128/78   Pulse 89   Temp 98.1 F (36.7 C) (Oral)   Ht  5\' 8"  (1.727 m)   Wt 169 lb (76.7 kg)   SpO2 94%   BMI 25.70 kg/m  Gen:  Alert, cooperative patient who appears stated age in no acute distress.  Vital signs reviewed. HEENT: EOMI,  MMM Back: Marked kyphosis noted. Cardiac:  Regular rate and rhythm  Pulm:  Clear on exam today without any noticeable wheezing..   Abd:  Soft/nondistended/nontender.  Chronic large hernia with overlying skin changes that are also chronic and no worse than before. No drainage. No bleeding. No redness or pain. Exts: Trace edema bilateral ankles. Neuro: No asterixis. Alert and oriented 4. Psych: Awake alert. Doing well. Swelling spontaneously. Not currently anxious.  No results found for this or any previous visit (from the past 72 hour(s)).

## 2016-02-13 NOTE — Assessment & Plan Note (Signed)
Reducible. No evidence of strangulation or incarceration. He is not interested in surgery at this time.

## 2016-02-14 ENCOUNTER — Encounter: Payer: Self-pay | Admitting: Family Medicine

## 2016-03-21 ENCOUNTER — Telehealth: Payer: Self-pay | Admitting: Family Medicine

## 2016-03-21 DIAGNOSIS — F411 Generalized anxiety disorder: Secondary | ICD-10-CM

## 2016-03-21 NOTE — Telephone Encounter (Signed)
Pt needs Prior Authorization for Klonopin. Pt uses [facility] on [address]. ep

## 2016-03-22 NOTE — Telephone Encounter (Signed)
I don't see a PA in my box?

## 2016-03-25 MED ORDER — CLONAZEPAM 0.5 MG PO TABS: ORAL_TABLET | ORAL | 2 refills | Status: DC

## 2016-03-25 NOTE — Telephone Encounter (Signed)
I think the patient be requesting a refill not a PA.  I reviewed the formulary and the generic is tier 1.  Clovis Pu, RN

## 2016-03-25 NOTE — Telephone Encounter (Signed)
Ok I have printed his prescription and put in the "to be faxed" box.

## 2016-03-26 ENCOUNTER — Encounter: Payer: Self-pay | Admitting: Family Medicine

## 2016-03-26 ENCOUNTER — Ambulatory Visit (INDEPENDENT_AMBULATORY_CARE_PROVIDER_SITE_OTHER): Payer: Managed Care, Other (non HMO) | Admitting: Family Medicine

## 2016-03-26 VITALS — BP 122/78 | HR 88 | Temp 98.0°F | Ht 68.0 in | Wt 170.6 lb

## 2016-03-26 DIAGNOSIS — F419 Anxiety disorder, unspecified: Secondary | ICD-10-CM | POA: Diagnosis not present

## 2016-03-26 DIAGNOSIS — F1020 Alcohol dependence, uncomplicated: Secondary | ICD-10-CM

## 2016-03-26 DIAGNOSIS — E871 Hypo-osmolality and hyponatremia: Secondary | ICD-10-CM

## 2016-03-26 DIAGNOSIS — J479 Bronchiectasis, uncomplicated: Secondary | ICD-10-CM | POA: Diagnosis not present

## 2016-03-26 LAB — BASIC METABOLIC PANEL
BUN: 16 mg/dL (ref 7–25)
CALCIUM: 9.3 mg/dL (ref 8.6–10.3)
CHLORIDE: 98 mmol/L (ref 98–110)
CO2: 26 mmol/L (ref 20–31)
Creat: 0.93 mg/dL (ref 0.70–1.25)
Glucose, Bld: 86 mg/dL (ref 65–99)
POTASSIUM: 4.7 mmol/L (ref 3.5–5.3)
SODIUM: 133 mmol/L — AB (ref 135–146)

## 2016-03-26 MED ORDER — VENLAFAXINE HCL ER 75 MG PO CP24: 75.0000 mg | ORAL_CAPSULE | Freq: Every day | ORAL | 3 refills | Status: DC

## 2016-03-26 NOTE — Progress Notes (Signed)
Subjective:    Juan [name] is a 66 y.o. male who presents to [facility] today for anxiety:  1.  Anxiety:  Very much improved since he was started on Effexor. He rates his anxiety as 8 out of 10 prior to starting the Effexor and now 3 out of 10. He thinks it is a good dose. He is also continuing his Klonopin. He's had no depressive symptoms. His enjoying his days more now. No suicidal or homicidal ideation.  2.  Alcoholism:  Long-standing issue for the patient. He is now currently only drinking 2 beers at lunch and 2 beers entered. This was continued from last visit. He is in no weakness. No nausea vomiting. No diarrhea. States that the beer is controlling his cravings.  3.  Right eye:  Blurry vision in his right eye. He has been told in the past that he had a cataract in that eye. He has blurriness that is worse at night. States that light is scattered at nighttime. Also some difficulty seeing out of that eye during the day. This is been a gradual process over the past 6-12 months. No eye pain or redness. No injury to his eye.   ROS as above per HPI.   The following portions of the patient's history were reviewed and updated as appropriate: allergies, current medications, past medical history, family and social history, and problem list. Patient is a nonsmoker.    PMH reviewed.  Past Medical History:  Diagnosis Date  . Alcoholism (HCC)   . Allergy    seasonal   . Anxiety   . Asthma   . CHF (congestive heart failure) (HCC) 10/2010   ECHO:  EF 40%, Grade II diastolic dysfunction  . COPD (chronic obstructive pulmonary disease) (HCC)   . Depression   . Headache   . Hyperlipidemia   . Hypertension   . Seizures (HCC)    Past Surgical History:  Procedure Laterality Date  . TOENAIL EXCISION Right 1967   Removal of ingrown toenail [Other]    Medications reviewed. Current Outpatient Prescriptions  Medication Sig Dispense Refill  . acetaminophen (TYLENOL) 325 MG tablet Take 2 tablets (650  mg total) by mouth every 6 (six) hours as needed for moderate pain. 30 tablet 0  . albuterol (PROVENTIL) (2.5 MG/3ML) 0.083% nebulizer solution INHALE 1 VIAL VIA NEBULIZER EVERY 6 HOURS AS NEEDED FOR WHEEZING OR SHORTNESS OF BREATH 360 mL 1  . albuterol-ipratropium (COMBIVENT) 18-103 MCG/ACT inhaler Inhale 1-2 puffs into the lungs 4 (four) times daily as needed for wheezing. 1 Inhaler 6  . arformoterol (BROVANA) 15 MCG/2ML NEBU Take 2 mLs (15 mcg total) by nebulization 2 (two) times daily. 120 mL 2  . aspirin 81 MG chewable tablet Chew 1 tablet (81 mg total) by mouth daily. 30 tablet 0  . budesonide (PULMICORT) 0.5 MG/2ML nebulizer solution Take 2 mLs (0.5 mg total) by nebulization 2 (two) times daily. 2 mL 12  . clonazePAM (KLONOPIN) 0.5 MG tablet TAKE 1 TAB BY MOUTH TWICE DAILY AS NEEDED FOR ANXIETY 90 tablet 2  . hydroxypropyl methylcellulose / hypromellose (ISOPTO TEARS / GONIOVISC) 2.5 % ophthalmic solution Place 1 drop into both eyes 3 (three) times daily as needed for dry eyes. 15 mL 0  . ipratropium (ATROVENT) 0.02 % nebulizer solution Take 2.5 mLs (0.5 mg total) by nebulization 4 (four) times daily. 75 mL 11  . Multiple Vitamin (MULTIVITAMIN WITH MINERALS) TABS tablet Take 1 tablet by mouth daily.    Marland Kitchen tiotropium (SPIRIVA  HANDIHALER) 18 MCG inhalation capsule Place 1 capsule (18 mcg total) into inhaler and inhale daily. 30 capsule 12  . venlafaxine (EFFEXOR) 75 MG tablet Take 1 tablet (75 mg total) by mouth 2 (two) times daily. 60 tablet 1   No current facility-administered medications for this visit.      Objective:   Physical Exam BP 122/78   Pulse 88   Temp 98 F (36.7 C) (Oral)   Ht 5\' 8"  (1.727 m)   Wt 170 lb 9.6 oz (77.4 kg)   SpO2 99%   BMI 25.94 kg/m  Gen:  Alert, cooperative patient who appears stated age in no acute distress.  Vital signs reviewed. HEENT: EOMI,  MMM Cardiac:  Regular rate and rhythm without murmur auscultated.  Good S1/S2. Pulm:  Mild wheezing at  bases. Abd:  Soft/nondistended/nontender.  Hernia nonpainful Exts: Non edematous BL  LE, warm and well perfused.   No results found for this or any previous visit (from the past 72 hour(s)).

## 2016-03-26 NOTE — Assessment & Plan Note (Signed)
Continues doing well with this. He is on 4 beers today. He has not needed to switch back to liquor. No cravings.

## 2016-03-26 NOTE — Assessment & Plan Note (Signed)
Much improved since starting Effexor. We will switch this to long acting so has to take 1 pill a day. Checking his sodium again today as his long-standing issues of hyponatremia.

## 2016-03-26 NOTE — Assessment & Plan Note (Signed)
Checking sodium today.

## 2016-03-26 NOTE — Assessment & Plan Note (Signed)
Controlled currently. No changes.

## 2016-03-26 NOTE — Patient Instructions (Signed)
It was good to see you again today.  You should hear something within a week or so about the ophthalmology appointment.  We are rechecking your sodium today.  Come back and see me in about 3 months. Things look like they're going well!

## 2016-04-14 ENCOUNTER — Other Ambulatory Visit: Payer: Self-pay | Admitting: Family Medicine

## 2016-05-02 ENCOUNTER — Telehealth: Payer: Self-pay | Admitting: Family Medicine

## 2016-05-02 DIAGNOSIS — H269 Unspecified cataract: Secondary | ICD-10-CM

## 2016-05-02 NOTE — Telephone Encounter (Signed)
Patient called today requesting to be referred to:  Specialist:  ophthalmologist Provider name/phone number:   Diagnosis (code):  cataracts Date of last OV:  03-26-16  Note routed to provider and Referral Coordinator [facility]).  Will call patient back regarding referral status.  Juan [name]  Pt states he was supposed to have a referral put in for laser surgery to remove his cataracts after last OV. ep

## 2016-05-03 NOTE — Telephone Encounter (Signed)
He's right, there was supposed to be a referral.  I have put this on.  Thanks

## 2016-05-21 ENCOUNTER — Telehealth: Payer: Self-pay | Admitting: *Deleted

## 2016-05-21 DIAGNOSIS — H33319 Horseshoe tear of retina without detachment, unspecified eye: Secondary | ICD-10-CM

## 2016-05-21 NOTE — Telephone Encounter (Signed)
Patient states he saw optho. and it was found that he has a hole in his retina. Opthalmologist is going to repair this but needs a letter clearing patient since he will be under anesthesia briefly. Patient wants to know if PCP can write a letter clearing him for procedure or if he needs to schedule an appointment first.

## 2016-05-22 NOTE — Telephone Encounter (Signed)
This is tricky.  He should probably see a cardiologist for surgical clearance.  He has a history of alcoholism and Echo with decreased EF in past.  He also has bronchiectasis.    I will put in a referral for cardiologist.

## 2016-05-28 NOTE — Telephone Encounter (Signed)
LVM for pt to call back to inform him of below. Zimmerman Rumple, Rayson Rando D, CMA  

## 2016-06-18 ENCOUNTER — Ambulatory Visit: Payer: Managed Care, Other (non HMO) | Admitting: Internal Medicine

## 2016-06-20 NOTE — Telephone Encounter (Signed)
Pt cancelled appt with Dr. [name] on 5/16. Pt stated he would call their office back in a couple of weeks to reschedule Appt. Sunday Spillers, CMA

## 2016-07-30 ENCOUNTER — Other Ambulatory Visit: Payer: Self-pay | Admitting: Family Medicine

## 2016-08-20 ENCOUNTER — Other Ambulatory Visit: Payer: Self-pay | Admitting: Family Medicine

## 2016-08-20 DIAGNOSIS — F411 Generalized anxiety disorder: Secondary | ICD-10-CM

## 2016-08-22 ENCOUNTER — Other Ambulatory Visit: Payer: Self-pay | Admitting: Family Medicine

## 2016-08-22 DIAGNOSIS — F411 Generalized anxiety disorder: Secondary | ICD-10-CM

## 2016-10-14 ENCOUNTER — Other Ambulatory Visit: Payer: Self-pay | Admitting: Family Medicine

## 2016-10-14 DIAGNOSIS — F411 Generalized anxiety disorder: Secondary | ICD-10-CM

## 2016-10-15 MED ORDER — CLONAZEPAM 0.5 MG PO TABS: ORAL_TABLET | ORAL | 1 refills | Status: DC

## 2016-11-11 ENCOUNTER — Other Ambulatory Visit: Payer: Self-pay | Admitting: Family Medicine

## 2016-11-29 ENCOUNTER — Ambulatory Visit: Payer: Self-pay | Admitting: Family Medicine

## 2016-12-03 ENCOUNTER — Other Ambulatory Visit: Payer: Self-pay | Admitting: Family Medicine

## 2016-12-10 ENCOUNTER — Other Ambulatory Visit: Payer: Self-pay | Admitting: Family Medicine

## 2016-12-13 ENCOUNTER — Ambulatory Visit (INDEPENDENT_AMBULATORY_CARE_PROVIDER_SITE_OTHER): Payer: 59 | Admitting: Family Medicine

## 2016-12-13 ENCOUNTER — Encounter: Payer: Self-pay | Admitting: Family Medicine

## 2016-12-13 VITALS — BP 118/72 | HR 97 | Temp 98.2°F | Ht 68.0 in | Wt 202.4 lb

## 2016-12-13 DIAGNOSIS — F411 Generalized anxiety disorder: Secondary | ICD-10-CM

## 2016-12-13 DIAGNOSIS — E871 Hypo-osmolality and hyponatremia: Secondary | ICD-10-CM

## 2016-12-13 DIAGNOSIS — Z23 Encounter for immunization: Secondary | ICD-10-CM

## 2016-12-13 MED ORDER — CLONAZEPAM 0.5 MG PO TABS: ORAL_TABLET | ORAL | 2 refills | Status: DC

## 2016-12-13 MED ORDER — ZOSTER VAC RECOMB ADJUVANTED 50 MCG/0.5ML IM SUSR: 0.5000 mL | Freq: Once | INTRAMUSCULAR | 0 refills | Status: AC

## 2016-12-13 MED ORDER — OLODATEROL HCL 2.5 MCG/ACT IN AERS: 2.0000 | INHALATION_SPRAY | Freq: Every day | RESPIRATORY_TRACT | 2 refills | Status: DC

## 2016-12-13 MED ORDER — VENLAFAXINE HCL ER 150 MG PO CP24: 150.0000 mg | ORAL_CAPSULE | Freq: Every day | ORAL | 2 refills | Status: DC

## 2016-12-13 MED ORDER — IPRATROPIUM-ALBUTEROL 20-100 MCG/ACT IN AERS: INHALATION_SPRAY | RESPIRATORY_TRACT | 6 refills | Status: DC

## 2016-12-13 NOTE — Patient Instructions (Signed)
We are switching you to [place] from the [place].  If this isn't covered, we may need to try pulmonology.  I have increased your effexor.    We are checking your labs today, and will repeat these again in 2 weeks after increasing your effexor.   Go to the pharmacy for your shingles shot.  Come back and see me in about 3 months.
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
## 2016-12-14 LAB — CBC
HEMATOCRIT: 43.2 % (ref 37.5–51.0)
Hemoglobin: 14.9 g/dL (ref 13.0–17.7)
MCH: 30 pg (ref 26.6–33.0)
MCHC: 34.5 g/dL (ref 31.5–35.7)
MCV: 87 fL (ref 79–97)
PLATELETS: 276 10*3/uL (ref 150–379)
RBC: 4.97 x10E6/uL (ref 4.14–5.80)
RDW: 14.1 % (ref 12.3–15.4)
WBC: 13.4 10*3/uL — AB (ref 3.4–10.8)

## 2016-12-14 LAB — COMPREHENSIVE METABOLIC PANEL
A/G RATIO: 1.5 (ref 1.2–2.2)
ALBUMIN: 4.3 g/dL (ref 3.6–4.8)
ALK PHOS: 71 IU/L (ref 39–117)
ALT: 19 IU/L (ref 0–44)
AST: 20 IU/L (ref 0–40)
BILIRUBIN TOTAL: 0.4 mg/dL (ref 0.0–1.2)
BUN / CREAT RATIO: 13 (ref 10–24)
BUN: 13 mg/dL (ref 8–27)
CHLORIDE: 96 mmol/L (ref 96–106)
CO2: 25 mmol/L (ref 20–29)
Calcium: 9.3 mg/dL (ref 8.6–10.2)
Creatinine, Ser: 1.04 mg/dL (ref 0.76–1.27)
GFR calc Af Amer: 87 mL/min/{1.73_m2} (ref >59)
GFR calc non Af Amer: 76 mL/min/{1.73_m2} (ref >59)
GLOBULIN, TOTAL: 2.8 g/dL (ref 1.5–4.5)
GLUCOSE: 102 mg/dL — AB (ref 65–99)
POTASSIUM: 5 mmol/L (ref 3.5–5.2)
SODIUM: 135 mmol/L (ref 134–144)
TOTAL PROTEIN: 7.1 g/dL (ref 6.0–8.5)

## 2016-12-30 ENCOUNTER — Other Ambulatory Visit: Payer: 59

## 2016-12-30 DIAGNOSIS — R160 Hepatomegaly, not elsewhere classified: Secondary | ICD-10-CM

## 2016-12-30 DIAGNOSIS — I10 Essential (primary) hypertension: Secondary | ICD-10-CM

## 2016-12-31 LAB — CMP14+EGFR
A/G RATIO: 1.7 (ref 1.2–2.2)
ALT: 15 IU/L (ref 0–44)
AST: 17 IU/L (ref 0–40)
Albumin: 4.3 g/dL (ref 3.6–4.8)
Alkaline Phosphatase: 71 IU/L (ref 39–117)
BILIRUBIN TOTAL: 0.3 mg/dL (ref 0.0–1.2)
BUN/Creatinine Ratio: 14 (ref 10–24)
BUN: 13 mg/dL (ref 8–27)
CALCIUM: 9.1 mg/dL (ref 8.6–10.2)
CHLORIDE: 97 mmol/L (ref 96–106)
CO2: 19 mmol/L — ABNORMAL LOW (ref 20–29)
Creatinine, Ser: 0.93 mg/dL (ref 0.76–1.27)
GFR calc Af Amer: 100 mL/min/{1.73_m2} (ref >59)
GFR calc non Af Amer: 86 mL/min/{1.73_m2} (ref >59)
GLUCOSE: 94 mg/dL (ref 65–99)
Globulin, Total: 2.6 g/dL (ref 1.5–4.5)
POTASSIUM: 5.4 mmol/L — AB (ref 3.5–5.2)
Sodium: 132 mmol/L — ABNORMAL LOW (ref 134–144)
TOTAL PROTEIN: 6.9 g/dL (ref 6.0–8.5)

## 2016-12-31 LAB — CBC
Hematocrit: 42.3 % (ref 37.5–51.0)
Hemoglobin: 14.7 g/dL (ref 13.0–17.7)
MCH: 30.5 pg (ref 26.6–33.0)
MCHC: 34.8 g/dL (ref 31.5–35.7)
MCV: 88 fL (ref 79–97)
PLATELETS: 263 10*3/uL (ref 150–379)
RBC: 4.82 x10E6/uL (ref 4.14–5.80)
RDW: 13.9 % (ref 12.3–15.4)
WBC: 11.1 10*3/uL — AB (ref 3.4–10.8)

## 2017-01-08 ENCOUNTER — Encounter: Payer: Self-pay | Admitting: Family Medicine

## 2017-01-15 ENCOUNTER — Other Ambulatory Visit: Payer: Self-pay | Admitting: Family Medicine

## 2017-01-15 MED ORDER — BUDESONIDE 0.5 MG/2ML IN SUSP: 0.5000 mg | Freq: Two times a day (BID) | RESPIRATORY_TRACT | 3 refills | Status: DC

## 2017-01-15 MED ORDER — ALBUTEROL SULFATE (2.5 MG/3ML) 0.083% IN NEBU: INHALATION_SOLUTION | RESPIRATORY_TRACT | 1 refills | Status: DC

## 2017-01-24 ENCOUNTER — Other Ambulatory Visit: Payer: Self-pay | Admitting: Family Medicine

## 2017-01-31 ENCOUNTER — Telehealth: Payer: Self-pay | Admitting: *Deleted

## 2017-01-31 NOTE — Telephone Encounter (Signed)
Received prior auth for pulmicort.   Called Monia Pouch [phone] and answered questions.   Will received answer in 24 - 48 hours via fax. Milas Gain, Maryjo Rochester, CMA

## 2017-03-21 ENCOUNTER — Other Ambulatory Visit: Payer: Self-pay | Admitting: Family Medicine

## 2017-03-21 DIAGNOSIS — R69 Illness, unspecified: Secondary | ICD-10-CM | POA: Diagnosis not present

## 2017-03-24 DIAGNOSIS — R69 Illness, unspecified: Secondary | ICD-10-CM | POA: Diagnosis not present

## 2017-03-25 ENCOUNTER — Encounter (HOSPITAL_COMMUNITY): Payer: Self-pay | Admitting: Emergency Medicine

## 2017-03-25 ENCOUNTER — Other Ambulatory Visit: Payer: Self-pay

## 2017-03-25 ENCOUNTER — Inpatient Hospital Stay (HOSPITAL_COMMUNITY)
Admission: EM | Admit: 2017-03-25 | Discharge: 2017-03-30 | DRG: 193 | Disposition: A | Discharge status: Home / Self Care | Payer: Medicare HMO | Attending: Family Medicine | Admitting: Family Medicine

## 2017-03-25 ENCOUNTER — Emergency Department (HOSPITAL_COMMUNITY): Payer: Medicare HMO

## 2017-03-25 DIAGNOSIS — Z820 Family history of epilepsy and other diseases of the nervous system: Secondary | ICD-10-CM

## 2017-03-25 DIAGNOSIS — Z8249 Family history of ischemic heart disease and other diseases of the circulatory system: Secondary | ICD-10-CM

## 2017-03-25 DIAGNOSIS — R0602 Shortness of breath: Secondary | ICD-10-CM | POA: Diagnosis not present

## 2017-03-25 DIAGNOSIS — R739 Hyperglycemia, unspecified: Secondary | ICD-10-CM | POA: Diagnosis present

## 2017-03-25 DIAGNOSIS — Z7982 Long term (current) use of aspirin: Secondary | ICD-10-CM

## 2017-03-25 DIAGNOSIS — F17210 Nicotine dependence, cigarettes, uncomplicated: Secondary | ICD-10-CM | POA: Diagnosis present

## 2017-03-25 DIAGNOSIS — J9621 Acute and chronic respiratory failure with hypoxia: Secondary | ICD-10-CM | POA: Diagnosis present

## 2017-03-25 DIAGNOSIS — I110 Hypertensive heart disease with heart failure: Secondary | ICD-10-CM | POA: Diagnosis present

## 2017-03-25 DIAGNOSIS — Z7951 Long term (current) use of inhaled steroids: Secondary | ICD-10-CM | POA: Diagnosis not present

## 2017-03-25 DIAGNOSIS — F410 Panic disorder [episodic paroxysmal anxiety] without agoraphobia: Secondary | ICD-10-CM | POA: Diagnosis present

## 2017-03-25 DIAGNOSIS — B9789 Other viral agents as the cause of diseases classified elsewhere: Secondary | ICD-10-CM | POA: Diagnosis not present

## 2017-03-25 DIAGNOSIS — F1010 Alcohol abuse, uncomplicated: Secondary | ICD-10-CM | POA: Diagnosis present

## 2017-03-25 DIAGNOSIS — E871 Hypo-osmolality and hyponatremia: Secondary | ICD-10-CM | POA: Diagnosis present

## 2017-03-25 DIAGNOSIS — I480 Paroxysmal atrial fibrillation: Secondary | ICD-10-CM | POA: Diagnosis present

## 2017-03-25 DIAGNOSIS — J471 Bronchiectasis with (acute) exacerbation: Secondary | ICD-10-CM | POA: Diagnosis present

## 2017-03-25 DIAGNOSIS — J181 Lobar pneumonia, unspecified organism: Principal | ICD-10-CM

## 2017-03-25 DIAGNOSIS — R0603 Acute respiratory distress: Secondary | ICD-10-CM | POA: Diagnosis not present

## 2017-03-25 DIAGNOSIS — J189 Pneumonia, unspecified organism: Secondary | ICD-10-CM | POA: Diagnosis present

## 2017-03-25 DIAGNOSIS — J449 Chronic obstructive pulmonary disease, unspecified: Secondary | ICD-10-CM | POA: Diagnosis not present

## 2017-03-25 DIAGNOSIS — L8990 Pressure ulcer of unspecified site, unspecified stage: Secondary | ICD-10-CM

## 2017-03-25 DIAGNOSIS — I4510 Unspecified right bundle-branch block: Secondary | ICD-10-CM | POA: Diagnosis present

## 2017-03-25 DIAGNOSIS — Z833 Family history of diabetes mellitus: Secondary | ICD-10-CM | POA: Diagnosis not present

## 2017-03-25 DIAGNOSIS — R069 Unspecified abnormalities of breathing: Secondary | ICD-10-CM | POA: Diagnosis not present

## 2017-03-25 DIAGNOSIS — J441 Chronic obstructive pulmonary disease with (acute) exacerbation: Secondary | ICD-10-CM | POA: Diagnosis not present

## 2017-03-25 DIAGNOSIS — J470 Bronchiectasis with acute lower respiratory infection: Secondary | ICD-10-CM | POA: Diagnosis not present

## 2017-03-25 DIAGNOSIS — F329 Major depressive disorder, single episode, unspecified: Secondary | ICD-10-CM | POA: Diagnosis present

## 2017-03-25 DIAGNOSIS — E872 Acidosis: Secondary | ICD-10-CM | POA: Diagnosis present

## 2017-03-25 DIAGNOSIS — I5032 Chronic diastolic (congestive) heart failure: Secondary | ICD-10-CM | POA: Diagnosis present

## 2017-03-25 LAB — INFLUENZA PANEL BY PCR (TYPE A & B)
Influenza A By PCR: NEGATIVE
Influenza B By PCR: NEGATIVE

## 2017-03-25 LAB — I-STAT VENOUS BLOOD GAS, ED
ACID-BASE DEFICIT: 4 mmol/L — AB (ref 0.0–2.0)
Bicarbonate: 23.6 mmol/L (ref 20.0–28.0)
O2 SAT: 98 %
PO2 VEN: 121 mmHg — AB (ref 32.0–45.0)
TCO2: 25 mmol/L (ref 22–32)
pCO2, Ven: 52 mmHg (ref 44.0–60.0)
pH, Ven: 7.265 (ref 7.250–7.430)

## 2017-03-25 LAB — BASIC METABOLIC PANEL
Anion gap: 16 — ABNORMAL HIGH (ref 5–15)
BUN: 11 mg/dL (ref 6–20)
CHLORIDE: 92 mmol/L — AB (ref 101–111)
CO2: 20 mmol/L — AB (ref 22–32)
CREATININE: 0.9 mg/dL (ref 0.61–1.24)
Calcium: 8.9 mg/dL (ref 8.9–10.3)
GFR calc Af Amer: >60 mL/min (ref >60)
GFR calc non Af Amer: >60 mL/min (ref >60)
Glucose, Bld: 237 mg/dL — ABNORMAL HIGH (ref 65–99)
Potassium: 4.5 mmol/L (ref 3.5–5.1)
SODIUM: 128 mmol/L — AB (ref 135–145)

## 2017-03-25 LAB — HEPATIC FUNCTION PANEL
ALBUMIN: 4 g/dL (ref 3.5–5.0)
ALT: 22 U/L (ref 17–63)
AST: 27 U/L (ref 15–41)
Alkaline Phosphatase: 72 U/L (ref 38–126)
BILIRUBIN TOTAL: 0.6 mg/dL (ref 0.3–1.2)
Bilirubin, Direct: 0.2 mg/dL (ref 0.1–0.5)
Indirect Bilirubin: 0.4 mg/dL (ref 0.3–0.9)
Total Protein: 8.3 g/dL — ABNORMAL HIGH (ref 6.5–8.1)

## 2017-03-25 LAB — CBC
HCT: 45.5 % (ref 39.0–52.0)
HEMATOCRIT: 45.2 % (ref 39.0–52.0)
Hemoglobin: 16.2 g/dL (ref 13.0–17.0)
Hemoglobin: 15.6 g/dL (ref 13.0–17.0)
MCH: 30.9 pg (ref 26.0–34.0)
MCH: 30.2 pg (ref 26.0–34.0)
MCHC: 35.6 g/dL (ref 30.0–36.0)
MCHC: 34.5 g/dL (ref 30.0–36.0)
MCV: 86.7 fL (ref 78.0–100.0)
MCV: 87.4 fL (ref 78.0–100.0)
PLATELETS: 213 10*3/uL (ref 150–400)
PLATELETS: 193 10*3/uL (ref 150–400)
RBC: 5.25 MIL/uL (ref 4.22–5.81)
RBC: 5.17 MIL/uL (ref 4.22–5.81)
RDW: 13.5 % (ref 11.5–15.5)
RDW: 13.5 % (ref 11.5–15.5)
WBC: 19.5 10*3/uL — ABNORMAL HIGH (ref 4.0–10.5)
WBC: 13.7 10*3/uL — AB (ref 4.0–10.5)

## 2017-03-25 LAB — CBG MONITORING, ED: GLUCOSE-CAPILLARY: 180 mg/dL — AB (ref 65–99)

## 2017-03-25 LAB — HEMOGLOBIN A1C
Hgb A1c MFr Bld: 6.4 % — ABNORMAL HIGH (ref 4.8–5.6)
Mean Plasma Glucose: 136.98 mg/dL

## 2017-03-25 LAB — RESPIRATORY PANEL BY PCR
Adenovirus: NOT DETECTED
Bordetella pertussis: NOT DETECTED
CHLAMYDOPHILA PNEUMONIAE-RVPPCR: NOT DETECTED
CORONAVIRUS OC43-RVPPCR: NOT DETECTED
Coronavirus 229E: NOT DETECTED
Coronavirus HKU1: NOT DETECTED
Coronavirus NL63: NOT DETECTED
INFLUENZA A-RVPPCR: NOT DETECTED
INFLUENZA B-RVPPCR: NOT DETECTED
MYCOPLASMA PNEUMONIAE-RVPPCR: NOT DETECTED
Metapneumovirus: NOT DETECTED
PARAINFLUENZA VIRUS 1-RVPPCR: NOT DETECTED
PARAINFLUENZA VIRUS 4-RVPPCR: NOT DETECTED
Parainfluenza Virus 2: NOT DETECTED
Parainfluenza Virus 3: NOT DETECTED
RESPIRATORY SYNCYTIAL VIRUS-RVPPCR: NOT DETECTED
Rhinovirus / Enterovirus: DETECTED — AB

## 2017-03-25 LAB — GLUCOSE, CAPILLARY
GLUCOSE-CAPILLARY: 173 mg/dL — AB (ref 65–99)
Glucose-Capillary: 165 mg/dL — ABNORMAL HIGH (ref 65–99)

## 2017-03-25 LAB — CREATININE, SERUM: Creatinine, Ser: 0.93 mg/dL (ref 0.61–1.24)

## 2017-03-25 LAB — I-STAT TROPONIN, ED: TROPONIN I, POC: 0.02 ng/mL (ref 0.00–0.08)

## 2017-03-25 LAB — BRAIN NATRIURETIC PEPTIDE: B Natriuretic Peptide: 169.2 pg/mL — ABNORMAL HIGH (ref 0.0–100.0)

## 2017-03-25 LAB — I-STAT CG4 LACTIC ACID, ED: Lactic Acid, Venous: 1.37 mmol/L (ref 0.5–1.9)

## 2017-03-25 LAB — MRSA PCR SCREENING: MRSA BY PCR: NEGATIVE

## 2017-03-25 LAB — ETHANOL: Alcohol, Ethyl (B): <10 mg/dL (ref <10)

## 2017-03-25 MED ORDER — SODIUM CHLORIDE 0.9 % IV SOLN
1.0000 g | Freq: Once | INTRAVENOUS | Status: AC
  Administered 2017-03-25: 1 g via INTRAVENOUS
  Filled 2017-03-25: qty 10

## 2017-03-25 MED ORDER — SODIUM CHLORIDE 0.9 % IV BOLUS (SEPSIS)
500.0000 mL | Freq: Once | INTRAVENOUS | Status: AC
  Administered 2017-03-25: 500 mL via INTRAVENOUS

## 2017-03-25 MED ORDER — LORAZEPAM 2 MG/ML IJ SOLN
1.0000 mg | Freq: Four times a day (QID) | INTRAMUSCULAR | Status: DC | PRN
  Administered 2017-03-25 – 2017-03-26 (×2): 1 mg via INTRAVENOUS
  Filled 2017-03-25 (×2): qty 1

## 2017-03-25 MED ORDER — SODIUM CHLORIDE 0.9 % IV SOLN
500.0000 mg | Freq: Once | INTRAVENOUS | Status: AC
  Administered 2017-03-25: 500 mg via INTRAVENOUS
  Filled 2017-03-25: qty 500

## 2017-03-25 MED ORDER — METHYLPREDNISOLONE SODIUM SUCC 125 MG IJ SOLR: 125.0000 mg | Freq: Every day | INTRAMUSCULAR | Status: DC

## 2017-03-25 MED ORDER — ALBUTEROL SULFATE (2.5 MG/3ML) 0.083% IN NEBU: 2.5000 mg | INHALATION_SOLUTION | RESPIRATORY_TRACT | Status: DC | PRN

## 2017-03-25 MED ORDER — IPRATROPIUM-ALBUTEROL 0.5-2.5 (3) MG/3ML IN SOLN
3.0000 mL | Freq: Once | RESPIRATORY_TRACT | Status: AC
  Administered 2017-03-25: 3 mL via RESPIRATORY_TRACT
  Filled 2017-03-25: qty 3

## 2017-03-25 MED ORDER — NICOTINE 21 MG/24HR TD PT24
21.0000 mg | MEDICATED_PATCH | Freq: Every day | TRANSDERMAL | Status: DC
  Administered 2017-03-25 – 2017-03-30 (×6): 21 mg via TRANSDERMAL
  Filled 2017-03-25 (×6): qty 1

## 2017-03-25 MED ORDER — HYPROMELLOSE (GONIOSCOPIC) 2.5 % OP SOLN: 1.0000 [drp] | Freq: Three times a day (TID) | OPHTHALMIC | Status: DC | PRN

## 2017-03-25 MED ORDER — LORAZEPAM 2 MG/ML IJ SOLN
2.0000 mg | INTRAMUSCULAR | Status: DC | PRN
  Administered 2017-03-26 – 2017-03-28 (×8): 2 mg via INTRAVENOUS
  Filled 2017-03-25 (×9): qty 1

## 2017-03-25 MED ORDER — CHLORHEXIDINE GLUCONATE 0.12 % MT SOLN
15.0000 mL | Freq: Two times a day (BID) | OROMUCOSAL | Status: DC
  Administered 2017-03-25 – 2017-03-30 (×10): 15 mL via OROMUCOSAL
  Filled 2017-03-25 (×10): qty 15

## 2017-03-25 MED ORDER — IPRATROPIUM-ALBUTEROL 0.5-2.5 (3) MG/3ML IN SOLN
3.0000 mL | RESPIRATORY_TRACT | Status: DC
  Administered 2017-03-25 – 2017-03-30 (×31): 3 mL via RESPIRATORY_TRACT
  Filled 2017-03-25 (×30): qty 3

## 2017-03-25 MED ORDER — INSULIN ASPART 100 UNIT/ML ~~LOC~~ SOLN
0.0000 [IU] | Freq: Three times a day (TID) | SUBCUTANEOUS | Status: DC
  Administered 2017-03-25 (×2): 2 [IU] via SUBCUTANEOUS
  Administered 2017-03-26: 5 [IU] via SUBCUTANEOUS
  Administered 2017-03-26: 1 [IU] via SUBCUTANEOUS
  Administered 2017-03-27: 2 [IU] via SUBCUTANEOUS
  Administered 2017-03-27: 1 [IU] via SUBCUTANEOUS
  Administered 2017-03-27: 2 [IU] via SUBCUTANEOUS
  Administered 2017-03-28 (×2): 3 [IU] via SUBCUTANEOUS
  Administered 2017-03-28: 2 [IU] via SUBCUTANEOUS
  Administered 2017-03-29: 1 [IU] via SUBCUTANEOUS
  Administered 2017-03-29: 3 [IU] via SUBCUTANEOUS
  Administered 2017-03-29: 2 [IU] via SUBCUTANEOUS
  Administered 2017-03-30: 3 [IU] via SUBCUTANEOUS
  Administered 2017-03-30: 2 [IU] via SUBCUTANEOUS
  Filled 2017-03-25: qty 1

## 2017-03-25 MED ORDER — ORAL CARE MOUTH RINSE
15.0000 mL | Freq: Two times a day (BID) | OROMUCOSAL | Status: DC
  Administered 2017-03-25 – 2017-03-28 (×7): 15 mL via OROMUCOSAL

## 2017-03-25 MED ORDER — ENOXAPARIN SODIUM 40 MG/0.4ML ~~LOC~~ SOLN
40.0000 mg | SUBCUTANEOUS | Status: DC
  Administered 2017-03-25 – 2017-03-29 (×5): 40 mg via SUBCUTANEOUS
  Filled 2017-03-25 (×5): qty 0.4

## 2017-03-25 MED ORDER — AZITHROMYCIN 250 MG PO TABS
250.0000 mg | ORAL_TABLET | Freq: Every day | ORAL | Status: DC
  Administered 2017-03-26 – 2017-03-30 (×5): 250 mg via ORAL
  Filled 2017-03-25 (×5): qty 1

## 2017-03-25 MED ORDER — SODIUM CHLORIDE 0.9 % IV SOLN
2.0000 g | INTRAVENOUS | Status: DC
  Administered 2017-03-25 – 2017-03-26 (×2): 2 g via INTRAVENOUS
  Filled 2017-03-25 (×2): qty 20

## 2017-03-25 NOTE — ED Provider Notes (Signed)
[facility] EMERGENCY DEPARTMENT Provider Note   CSN: [id number] Arrival date & time: 03/25/17  7425     History   Chief Complaint Chief Complaint  Patient presents with  . Respiratory Distress    HPI Juan [name] is a 67 y.o. male.  HPI   Patient is a 67 year old male presenting in respiratory distress.  According to EMS patient had several days of increasing shortness of breath.  His history of COPD, no history of CHF.  He reports that he has had a couple days of not feeling well, body aches.  Level 5 caveat respiratory distress.  Past Medical History:  Diagnosis Date  . Alcoholism (HCC)   . Allergy    seasonal   . Anxiety   . Asthma   . CHF (congestive heart failure) (HCC) 10/2010   ECHO:  EF 40%, Grade II diastolic dysfunction  . COPD (chronic obstructive pulmonary disease) (HCC)   . Depression   . Headache   . Hyperlipidemia   . Hypertension   . Seizures [facility])     Patient Active Problem List   Diagnosis Date Noted  . Malnutrition of moderate degree 12/18/2015  . COPD exacerbation (HCC)   . Alcoholism (HCC) 11/14/2014  . Atrial fibrillation with rapid ventricular response (HCC) 11/14/2014  . Sinusitis, chronic 08/05/2014  . Chronic diastolic heart failure (HCC)   . Peripheral neuropathy 12/15/2013  . Bronchiectasis without acute exacerbation (HCC) 06/30/2013  . Lung nodule seen on imaging study 06/15/2013  . RBBB 11/09/2012  . Hyponatremia 11/08/2012  . Umbilical hernia 08/06/2012  . Smoker unmotivated to quit 07/22/2012  . Tremor 07/30/2011  . HTN (hypertension) 07/30/2011  . Hepatomegaly 11/05/2010  . Depression 11/05/2010  . Anxiety 11/05/2010    Past Surgical History:  Procedure Laterality Date  . TOENAIL EXCISION Right 1967   Removal of ingrown toenail [Other]       Home Medications    Prior to Admission medications   Medication Sig Start Date End Date Taking? Authorizing Provider  acetaminophen (TYLENOL) 325  MG tablet Take 2 tablets (650 mg total) by mouth every 6 (six) hours as needed for moderate pain. 11/21/14   Jeralyn Bennett, MD  albuterol (PROVENTIL) (2.5 MG/3ML) 0.083% nebulizer solution INHALE 1 VIAL VIA NEBULIZER EVERY 6 HOURS AS NEEDED FOR WHEEZING OR SHORTNESS OF BREATH 01/15/17   Tobey Grim, MD  albuterol-ipratropium (COMBIVENT) 18-103 MCG/ACT inhaler Inhale 1-2 puffs into the lungs 4 (four) times daily as needed for wheezing. 02/13/16 02/12/17  Tobey Grim, MD  arformoterol (BROVANA) 15 MCG/2ML NEBU Take 2 mLs (15 mcg total) by nebulization 2 (two) times daily. 12/20/15   Mayo, Allyn Kenner, MD  aspirin 81 MG chewable tablet Chew 1 tablet (81 mg total) by mouth daily. 12/20/15   Mayo, Allyn Kenner, MD  budesonide (PULMICORT) 0.5 MG/2ML nebulizer solution Take 2 mLs (0.5 mg total) by nebulization 2 (two) times daily. 01/15/17   Tobey Grim, MD  clonazePAM (KLONOPIN) 0.5 MG tablet Patient needs OV before any further medications/refills provided. 10/15/16   Tobey Grim, MD  clonazePAM (KLONOPIN) 0.5 MG tablet TAKE 1 TABLET BY MOUTH 2 TIMES DAILY AS NEEDED FOR ANXIETY. 12/13/16   Tobey Grim, MD  hydroxypropyl methylcellulose / hypromellose (ISOPTO TEARS / GONIOVISC) 2.5 % ophthalmic solution Place 1 drop into both eyes 3 (three) times daily as needed for dry eyes. 01/01/15   Rumley, [place] N, DO  ipratropium (ATROVENT) 0.02 % nebulizer solution TAKE 2.5 MLS (  0.5 MG TOTAL) BY NEBULIZATION 4 (FOUR) TIMES DAILY. 03/24/17   Tobey Grim, MD  Ipratropium-Albuterol (COMBIVENT RESPIMAT) 20-100 MCG/ACT AERS respimat INHALE 1-2 PUFFS INTO THE LUNGS 4 (FOUR) TIMES DAILY AS NEEDED FOR WHEEZING. 12/13/16   Tobey Grim, MD  Multiple Vitamin (MULTIVITAMIN WITH MINERALS) TABS tablet Take 1 tablet by mouth daily.    [provider]  Olodaterol HCl 2.5 MCG/ACT AERS Inhale 2 puffs daily into the lungs. 12/13/16   Tobey Grim, MD  tiotropium (SPIRIVA HANDIHALER) 18 MCG  inhalation capsule Place 1 capsule (18 mcg total) into inhaler and inhale daily. 12/25/15   Howard Pouch, MD  venlafaxine XR (EFFEXOR-XR) 150 MG 24 hr capsule Take 1 capsule (150 mg total) daily with breakfast by mouth. 12/13/16   Tobey Grim, MD  venlafaxine XR (EFFEXOR-XR) 75 MG 24 hr capsule TAKE 1 CAPSULE (75 MG TOTAL) BY MOUTH DAILY WITH BREAKFAST. 01/24/17   Tobey Grim, MD  albuterol (PROVENTIL,VENTOLIN) 90 MCG/ACT inhaler Inhale 2 puffs into the lungs every 6 (six) hours as needed for wheezing. 01/03/11 04/22/11  Tobey Grim, MD    Family History Family History  Problem Relation Age of Onset  . Alzheimer's disease Mother   . Diabetes Mother   . Heart disease Maternal Uncle     Social History Social History   Tobacco Use  . Smoking status: Current Every Day Smoker    Packs/day: 1.50    Years: 40.00    Pack years: 60.00    Types: Cigarettes  . Smokeless tobacco: Never Used  . Tobacco comment: a little more than 1 ppd (02/17/14)  Substance Use Topics  . Alcohol use: Yes    Comment: 1/2 galloon liquior in 2 days     12/28/2014 wife reports 32oz / day  . Drug use: No     Allergies   Coreg [carvedilol] and Aspartame and phenylalanine   Review of Systems Review of Systems  Unable to perform ROS: Severe respiratory distress     Physical Exam Updated Vital Signs BP 122/80   Pulse (!) 125   Temp (!) 96.9 F (36.1 C) (Temporal)   Resp (!) 30   SpO2 99%   Physical Exam  Constitutional: He is oriented to person, place, and time. He appears well-nourished.  HENT:  Head: Normocephalic and atraumatic.  Eyes: Conjunctivae are normal.  Cardiovascular: Regular rhythm.  Pulmonary/Chest: Effort normal.  Tachypneic using accessory muscles.  On BiPAP diffusely distant breath sounds with occasional wheezes.  Abdominal: Soft. He exhibits distension. There is no tenderness.  Large protrusion around the umbilicus with skin breakdown .  No signs of induration,  area soft.  Musculoskeletal: He exhibits no edema or deformity.  Neurological: He is oriented to person, place, and time.  Skin: Skin is warm and dry. He is not diaphoretic.  Psychiatric: He has a normal mood and affect. His behavior is normal.     ED Treatments / Results  Labs (all labs ordered are listed, but only abnormal results are displayed) Labs Reviewed  BASIC METABOLIC PANEL - Abnormal; Notable for the following components:      Result Value   Sodium 128 (*)    Chloride 92 (*)    CO2 20 (*)    Glucose, Bld 237 (*)    Anion gap 16 (*)    All other components within normal limits  CBC - Abnormal; Notable for the following components:   WBC 19.5 (*)    All other components  within normal limits  I-STAT VENOUS BLOOD GAS, ED - Abnormal; Notable for the following components:   pO2, Ven 121.0 (*)    Acid-base deficit 4.0 (*)    All other components within normal limits  COMPREHENSIVE METABOLIC PANEL  BRAIN NATRIURETIC PEPTIDE  INFLUENZA PANEL BY PCR (TYPE A & B)  I-STAT TROPONIN, ED  I-STAT TROPONIN, ED  I-STAT CG4 LACTIC ACID, ED    EKG  EKG Interpretation  Date/Time:  Tuesday March 25 2017 08:57:02 EST Ventricular Rate:  135 PR Interval:    QRS Duration: 127 QT Interval:  404 QTC Calculation: 606 R Axis:   -100 Text Interpretation:  Sinus or ectopic atrial tachycardia RBBB and LAFB Left bundle branch block faster than previous Confirmed by Sarayah Bacchi ([id number]) on 03/25/2017 9:29:38 AM       Radiology No results found.  Procedures Procedures (including critical care time)  CRITICAL CARE Performed by: Arlana Hove Total critical care time: 60 minutes Critical care time was exclusive of separately billable procedures and treating other patients. Critical care was necessary to treat or prevent imminent or life-threatening deterioration. Critical care was time spent personally by me on the following activities: development of treatment plan  with patient and/or surrogate as well as nursing, discussions with consultants, evaluation of patient's response to treatment, examination of patient, obtaining history from patient or surrogate, ordering and performing treatments and interventions, ordering and review of laboratory studies, ordering and review of radiographic studies, pulse oximetry and re-evaluation of patient's condition.   Medications Ordered in ED Medications  sodium chloride 0.9 % bolus 500 mL (not administered)  ipratropium-albuterol (DUONEB) 0.5-2.5 (3) MG/3ML nebulizer solution 3 mL (3 mLs Nebulization Given 03/25/17 0944)     Initial Impression / Assessment and Plan / ED Course  I have reviewed the triage vital signs and the nursing notes.  Pertinent labs & imaging results that were available during my care of the patient were reviewed by me and considered in my medical decision making (see chart for details).     Patient is a 67 year old male presenting in respiratory distress.  According to EMS patient had several days of increasing shortness of breath.  His history of COPD, no history of CHF.  He reports that he has had a couple days of not feeling well, body aches.  Level 5 caveat respiratory distress.   9:45 AM   Patient looks improved on the BiPAP.  Patient's heart rate has dropped from 160 down to 120s.  Patient's respiratory rate has dropped from 45 to 30.  STill on bipap.  Xray shows likely infiltrates, will treat with ABX for CAP and admit.   Flu pending.     Final Clinical Impressions(s) / ED Diagnoses   Final diagnoses:  None    ED Discharge Orders    None       Abelino Derrick, MD 03/25/17 1548

## 2017-03-25 NOTE — Progress Notes (Signed)
MD returned page regarding pt concern for daily meds.  Pt unable to get meds secondary to bipap.  MD also made aware of pt large abdominal hernia and that it is also reddened and looks like may have some cellulitis.  Also made aware of fact that has open area on skin.  States they will come and look at this as they were unaware of this.

## 2017-03-25 NOTE — ED Triage Notes (Signed)
Pt to ER via [facility] for evaluation of acute onset of shortness of breath without relief with home nebulizers. Pt has significant hx of COPD and previous intubation. Labored respirations on arrival, patient is a/ox4. EMS reports initially no movement in lung fields, hypertensive 213/36 (manually) per EMS. Received 5 mg albuterol, 0.5 atrovent, 2 mg mag, and 125 solumedrol in route. Pt switched to Bipap on arrival.

## 2017-03-25 NOTE — Plan of Care (Signed)
Goals progressing

## 2017-03-25 NOTE — H&P (Signed)
[facility] Admission History and Physical Service Pager: [phone]  Patient name: Juan [name] Medical record number: [id number] Date of birth: [birth year] Age: 67 y.o. Gender: male  Primary Care Provider: Tobey Grim, MD Consultants: none Code Status: Full code (obtained on admission)  Chief Complaint: Dyspnea  Assessment and Plan: Juan [name] is a 67 y.o. male presenting with difficulty breathing and hypoxia.  PMH is significant for COPD/bronchiectasis, HTN (not on medication), RBBB, CHF, A. fib (not on medication) hyponatremia described is not, hyponatremia, depression, anxiety, tobacco use disorder, alcohol use disorder, lung nodule  Acute respiratory failure/CAP: Hypoxic to low 80s at home per his wife.  Likely due to pneumonia as noted on portable CXR.  Tachycardia and tachypnea improved with BiPAP and breathing treatments.  VBG with mild respiratory acidosis.  Received ceftriaxone and azithromycin in ED as well.  Lactic acid within normal limits.  Patient has history of COPD/bronchiectasis.  Lung exam wheeze some crackles over right side and upper airway sounds.  He has good air movement bilaterally. No chest pain to the angle of ACS.  EKG with RBBB and sinus tachycardia.  RBBB is old. initial troponin negative.  No signs of fluid overload to think of CHF.  BNP mildly elevated to 160's but as high as 500s previously.  Well's score 1.5 due to tachycardia which could be due to pneumonia versus PE.  He denies nausea/vomiting to think of aspiration.  -Admit to stepdown unit.  Attending Dr. Deirdre Priest -Continue BiPAP for now.  He may come off BiPAP soon -Continue ceftriaxone and azithromycin tomorrow.  Received a dose today. -Con consider broadening to Zosyn for anaerobic and pseudomonal coverage if no improvement -Start Tamiflu if influenza positive -Manage COPD as below -Follow-up influenza PCR and RVT -Urine strep pneumo and Legionella -Diet  n.p.o. while on BiPAP  COPD: Respiratory failure likely due to pneumonia versus COPD exacerbation.  However, he meets criteria for COPD exacerbation with dyspnea, chronic cough and sputum production.  Patient is on LAMA/LABA at home.  -BiPAP as above -Status post Solu-Medrol by EMS -Scheduled DuoNeb and as needed albuterol. -Continue steroid starting 2/20 -Resume home meds when able -Nicotine patch for smoking  Hyperglycemia with anion gap: Likely due to steroid. No history of diabetes or hyperglycemia.  On anion gap could be due to alcohol.  He states his last drink was this morning. -Check A1c -Sliding scale -CBG ACHS  Paroxysmal atrial fibrillation: Currently tachycardic but not in A. fib.  Chads vascular score 2-3 (not clear about history of hypertension).  Has bled 2 (due to alcohol use).  Not on any medication -Discussed anticoagulation on rate control  HFpEF: Echo in 11/17 with EFof 50-55%, no structural or functional abnormality.  Previously as low as 30-35% in 2014. Dyspnea likely due to pneumonia/COPD versus CHF.  No signs of fluid overload.  CXR without pulmonary edema.  BNP in 160's but and as high as 500's previously.  -Daily weights -Strict I & O's  Hyponatremia: Sodium 129.  Coreg just to 130 for hypoglycemia.  Baseline low 130s to mid 130s.  History of alcohol use.  Could have some element of beer potomania -BMP in a.m.  Depression/anxiety: Stable.  On Effexor and clonazepam at home. -Resume home medication when he comes on BiPAP. -CIWA  Alcohol use disorder: Reports drinking up to 3 beers a day.  Last drink this morning. -CIWA  Tobacco use disorder: Smokes one and half pack cigarettes a day.   -Nicotine  patch  FEN/GI:  -N.p.o. while on BiPAP  Prophylaxis: -Lovenox  Disposition: Admit to stepdown for acute respiratory failure.  Patient on BiPAP  History of Present Illness:  Juan [name] is a 67 y.o. male presenting with difficulty breathing  History  provided by patient and patient's wife at bedside. Patient reports not feeling well for the last 2-3 days.  He also reports having shortness of breath for 1 week.  He also reports runny nose, nasal congestion and sore throat since yesterday.  This morning, he was breathing hard and diaphoretic.  Wife checked his pulse ox which was down to 80% with a heart rate of 125.  She called EMS and he was brought to ED. He denies chest pain, fever, nausea, vomiting, abdominal pain.  He admits mild headache that has resolved.  He had some myalgia for the last couple of days.  He also reports cough which is at baseline.  Cough is productive with whitish to yellowish phlegm.  Denies hemoptysis.  Denies recent sick contact.  He said he had his flu vaccine this season.  He denies orthopnea, PND or leg swelling.  He reports using 2 pillows at baseline. He has history of COPD and bronchiectasis.  He reports using his breathing treatments including albuterol, Brovana and Spiriva at home. He lives with his wife.  He smokes 1-1/2 pack a day.  She reports drinking alcohol.  He says he drinks about 3 beers a day.  His last drink was this morning.  He denies recreational drug use.  Patient received a breathing treatment and Solu-Medrol en route to ED  ED course: Arrived on BiPAP.  Vital signs significant for tachycardia to 130s on tachycardia pia to 41.  He has leukocytosis to 19.5.  Portable CXR wheeze right mid upper lobe infiltrate suggestive for pneumonia.  VBG wheeze mild respiratory acidosis.  Lactic acid within normal limits.  BMP with hyperglycemia and anion gap.  Bicarb 20.  He has no history of diabetes.  Flu swab on RVP obtained and pending.  Was given DuoNeb x1, ceftriaxone and azithromycin with significant improvement in his respiratory status.  He is still on BiPAP.  Family medicine was called to admit patient for further management.  Review Of Systems:  Review of Systems  Constitutional: Negative for chills,  fever and weight loss.  HENT: Positive for congestion and sore throat.   Eyes: Negative for blurred vision, photophobia and pain.  Respiratory: Positive for cough, sputum production and shortness of breath.   Cardiovascular: Negative for chest pain, palpitations and leg swelling.  Gastrointestinal: Negative for abdominal pain, blood in stool, diarrhea, melena, nausea and vomiting.  Genitourinary: Negative for dysuria and hematuria.  Musculoskeletal: Positive for myalgias.  Skin: Negative for rash.  Neurological: Positive for headaches. Negative for speech change, focal weakness and weakness.  Endo/Heme/Allergies: Does not bruise/bleed easily.  Psychiatric/Behavioral: Positive for depression. Negative for substance abuse. The patient is not nervous/anxious.     Patient Active Problem List   Diagnosis Date Noted  . CAP (community acquired pneumonia) 03/25/2017  . Malnutrition of moderate degree 12/18/2015  . COPD exacerbation (HCC)   . Alcoholism (HCC) 11/14/2014  . Atrial fibrillation with rapid ventricular response (HCC) 11/14/2014  . Sinusitis, chronic 08/05/2014  . Chronic diastolic heart failure (HCC)   . Peripheral neuropathy 12/15/2013  . Bronchiectasis without acute exacerbation (HCC) 06/30/2013  . Lung nodule seen on imaging study 06/15/2013  . RBBB 11/09/2012  . Hyponatremia 11/08/2012  . Umbilical hernia 08/06/2012  .  Smoker unmotivated to quit 07/22/2012  . Tremor 07/30/2011  . HTN (hypertension) 07/30/2011  . Hepatomegaly 11/05/2010  . Depression 11/05/2010  . Anxiety 11/05/2010    Past Medical History: Past Medical History:  Diagnosis Date  . Alcoholism (HCC)   . Allergy    seasonal   . Anxiety   . Asthma   . CHF (congestive heart failure) (HCC) 10/2010   ECHO:  EF 40%, Grade II diastolic dysfunction  . COPD (chronic obstructive pulmonary disease) (HCC)   . Depression   . Headache   . Hyperlipidemia   . Hypertension   . Seizures (HCC)     Past  Surgical History: Past Surgical History:  Procedure Laterality Date  . TOENAIL EXCISION Right 1967   Removal of ingrown toenail [Other]    Social History: Social History   Tobacco Use  . Smoking status: Current Every Day Smoker    Packs/day: 1.50    Years: 40.00    Pack years: 60.00    Types: Cigarettes  . Smokeless tobacco: Never Used  . Tobacco comment: a little more than 1 ppd (02/17/14)  Substance Use Topics  . Alcohol use: Yes    Comment: 1/2 galloon liquior in 2 days     12/28/2014 wife reports 32oz / day  . Drug use: No   Additional social history: Please also refer to relevant sections of EMR.  Family History: Family History  Problem Relation Age of Onset  . Alzheimer's disease Mother   . Diabetes Mother   . Heart disease Maternal Uncle    (If not completed, MUST add something in)  Allergies and Medications: Allergies  Allergen Reactions  . Coreg [Carvedilol] Shortness Of Breath  . Aspartame And Phenylalanine Nausea And Vomiting   No current facility-administered medications on file prior to encounter.    Current Outpatient Medications on File Prior to Encounter  Medication Sig Dispense Refill  . acetaminophen (TYLENOL) 325 MG tablet Take 2 tablets (650 mg total) by mouth every 6 (six) hours as needed for moderate pain. 30 tablet 0  . albuterol (PROVENTIL) (2.5 MG/3ML) 0.083% nebulizer solution INHALE 1 VIAL VIA NEBULIZER EVERY 6 HOURS AS NEEDED FOR WHEEZING OR SHORTNESS OF BREATH 375 mL 1  . albuterol-ipratropium (COMBIVENT) 18-103 MCG/ACT inhaler Inhale 1-2 puffs into the lungs 4 (four) times daily as needed for wheezing. 1 Inhaler 6  . arformoterol (BROVANA) 15 MCG/2ML NEBU Take 2 mLs (15 mcg total) by nebulization 2 (two) times daily. 120 mL 2  . budesonide (PULMICORT) 0.5 MG/2ML nebulizer solution Take 2 mLs (0.5 mg total) by nebulization 2 (two) times daily. 2 mL 3  . clonazePAM (KLONOPIN) 0.5 MG tablet TAKE 1 TABLET BY MOUTH 2 TIMES DAILY AS NEEDED FOR  ANXIETY. (Patient taking differently: Take 0.5 mg by mouth 2 (two) times daily. ) 90 tablet 2  . Doxylamine-DM (VICKS NYQUIL COUGH) 6.25-15 MG/15ML LIQD Take 30 mLs by mouth at bedtime as needed (for sleep== nyquil).    Marland Kitchen ePHEDrine-GuaiFENesin (PRIMATENE ASTHMA) 12.5-200 MG TABS Take 2 tablets by mouth as needed (for breathing == primatene).    Marland Kitchen EPINEPHrine Base (PRIMATENE MIST IN) Inhale 1 puff into the lungs as needed (for breathing).    . hydroxypropyl methylcellulose / hypromellose (ISOPTO TEARS / GONIOVISC) 2.5 % ophthalmic solution Place 1 drop into both eyes 3 (three) times daily as needed for dry eyes. 15 mL 0  . ipratropium (ATROVENT) 0.02 % nebulizer solution TAKE 2.5 MLS (0.5 MG TOTAL) BY NEBULIZATION 4 (FOUR) TIMES  DAILY. 62.5 mL 0  . Multiple Vitamin (MULTIVITAMIN WITH MINERALS) TABS tablet Take 1 tablet by mouth daily as needed (for vitamin).     . Olodaterol HCl 2.5 MCG/ACT AERS Inhale 2 puffs daily into the lungs. 4 g 2  . phenylephrine (4-WAY FAST ACTING) 1 % nasal spray Place 1 drop into both nostrils every 6 (six) hours as needed for congestion (4 way nasal spray).    . sodium chloride (OCEAN) 0.65 % SOLN nasal spray Place 1 spray into both nostrils as needed for congestion.    Marland Kitchen venlafaxine XR (EFFEXOR-XR) 150 MG 24 hr capsule Take 1 capsule (150 mg total) daily with breakfast by mouth. 30 capsule 2  . aspirin 81 MG chewable tablet Chew 1 tablet (81 mg total) by mouth daily. (Patient not taking: Reported on 03/25/2017) 30 tablet 0  . clonazePAM (KLONOPIN) 0.5 MG tablet Patient needs OV before any further medications/refills provided. (Patient not taking: Reported on 03/25/2017) 45 tablet 1  . Ipratropium-Albuterol (COMBIVENT RESPIMAT) 20-100 MCG/ACT AERS respimat INHALE 1-2 PUFFS INTO THE LUNGS 4 (FOUR) TIMES DAILY AS NEEDED FOR WHEEZING. (Patient not taking: Reported on 03/25/2017) 4 g 6  . tiotropium (SPIRIVA HANDIHALER) 18 MCG inhalation capsule Place 1 capsule (18 mcg total) into  inhaler and inhale daily. (Patient not taking: Reported on 03/25/2017) 30 capsule 12  . venlafaxine XR (EFFEXOR-XR) 75 MG 24 hr capsule TAKE 1 CAPSULE (75 MG TOTAL) BY MOUTH DAILY WITH BREAKFAST. (Patient not taking: Reported on 03/25/2017) 30 capsule 3  . [DISCONTINUED] albuterol (PROVENTIL,VENTOLIN) 90 MCG/ACT inhaler Inhale 2 puffs into the lungs every 6 (six) hours as needed for wheezing. 17 g 0    Objective: BP (!) 128/95   Pulse (!) 114   Temp (!) 96.9 F (36.1 C) (Temporal)   Resp (!) 27   SpO2 99%  Exam: GEN: Lying in bed, on BiPAP, able to sit up in bed easily for exam Head: normocephalic and atraumatic  Eyes: conjunctiva without injection, sclera anicteric Ears: external ear and ear canal normal Oropharynx: On BiPAP CVS: Tachycardic, RR, nl s1 & s2, no murmurs, no edema,  2+ DP pulses bilaterally RESP: On BiPAP, tachypneic, mild IWOB, good air movement bilaterally, some crackles over right lung, upper airway sound, no wheeze GI: BS present & normal, soft, NTND, no guarding, no rebound, no mass GU: no suprapubic or CVA tenderness MSK: no focal tenderness or notable swelling SKIN: Some dry scaly skin rash on his feet NEURO: AAO x4, cranial nerves grossly intact, motor 5/5 in all extremities, light sensation intact PSYCH: euthymic mood with congruent affect.  No SI/HI  Labs and Imaging: CBC BMET  Recent Labs  Lab 03/25/17 0858  WBC 19.5*  HGB 16.2  HCT 45.5  PLT 213   Recent Labs  Lab 03/25/17 0858  NA 128*  K 4.5  CL 92*  CO2 20*  BUN 11  CREATININE 0.90  GLUCOSE 237*  CALCIUM 8.9     Dg Chest Portable 1 View  Result Date: 03/25/2017 CLINICAL DATA:  Shortness of breath. EXAM: PORTABLE CHEST 1 VIEW COMPARISON:  12/16/2015. FINDINGS: Interim extubation. Cardiomegaly with normal pulmonary vascularity. Mild upper lung infiltrate. No pleural effusion or pneumothorax. Degenerative changes thoracic spine. IMPRESSION: 1.  Interim extubation. 2.  Mild right upper lung  infiltrate consistent pneumonia. 3.  Mild cardiomegaly.  No pulmonary venous congestion. Electronically Signed   By: Maisie Fus  Register   On: 03/25/2017 09:56    Almon Hercules, MD 03/25/2017, 12:33 PM  PGY-3, [facility] FPTS Intern pager: [phone], text pages welcome

## 2017-03-26 LAB — CBC
HEMATOCRIT: 43.4 % (ref 39.0–52.0)
HEMOGLOBIN: 15.1 g/dL (ref 13.0–17.0)
MCH: 30.5 pg (ref 26.0–34.0)
MCHC: 34.8 g/dL (ref 30.0–36.0)
MCV: 87.7 fL (ref 78.0–100.0)
Platelets: 227 10*3/uL (ref 150–400)
RBC: 4.95 MIL/uL (ref 4.22–5.81)
RDW: 14 % (ref 11.5–15.5)
WBC: 9.6 10*3/uL (ref 4.0–10.5)

## 2017-03-26 LAB — BLOOD GAS, ARTERIAL
Acid-base deficit: 0.5 mmol/L (ref 0.0–2.0)
BICARBONATE: 24.2 mmol/L (ref 20.0–28.0)
DRAWN BY: 51185
Delivery systems: POSITIVE
Expiratory PAP: 8
FIO2: 30
INSPIRATORY PAP: 16
O2 SAT: 99 %
PCO2 ART: 43.3 mmHg (ref 32.0–48.0)
PO2 ART: 175 mmHg — AB (ref 83.0–108.0)
Patient temperature: 98.6
RATE: 8 resp/min
pH, Arterial: 7.366 (ref 7.350–7.450)

## 2017-03-26 LAB — BASIC METABOLIC PANEL
ANION GAP: 12 (ref 5–15)
BUN: 19 mg/dL (ref 6–20)
CHLORIDE: 97 mmol/L — AB (ref 101–111)
CO2: 21 mmol/L — AB (ref 22–32)
Calcium: 8.5 mg/dL — ABNORMAL LOW (ref 8.9–10.3)
Creatinine, Ser: 0.97 mg/dL (ref 0.61–1.24)
GFR calc non Af Amer: >60 mL/min (ref >60)
GLUCOSE: 139 mg/dL — AB (ref 65–99)
Potassium: 4.5 mmol/L (ref 3.5–5.1)
Sodium: 130 mmol/L — ABNORMAL LOW (ref 135–145)

## 2017-03-26 LAB — HIV ANTIBODY (ROUTINE TESTING W REFLEX): HIV Screen 4th Generation wRfx: NONREACTIVE

## 2017-03-26 LAB — GLUCOSE, CAPILLARY
GLUCOSE-CAPILLARY: 107 mg/dL — AB (ref 65–99)
Glucose-Capillary: 123 mg/dL — ABNORMAL HIGH (ref 65–99)
Glucose-Capillary: 265 mg/dL — ABNORMAL HIGH (ref 65–99)
Glucose-Capillary: 224 mg/dL — ABNORMAL HIGH (ref 65–99)

## 2017-03-26 MED ORDER — PREDNISONE 20 MG PO TABS
50.0000 mg | ORAL_TABLET | Freq: Every day | ORAL | Status: DC
  Administered 2017-03-27 – 2017-03-30 (×4): 50 mg via ORAL
  Filled 2017-03-26 (×4): qty 2

## 2017-03-26 MED ORDER — LORAZEPAM 2 MG/ML IJ SOLN
0.5000 mg | Freq: Three times a day (TID) | INTRAMUSCULAR | Status: DC
  Administered 2017-03-26 – 2017-03-27 (×2): 0.5 mg via INTRAVENOUS
  Filled 2017-03-26 (×2): qty 1

## 2017-03-26 MED ORDER — METHYLPREDNISOLONE SODIUM SUCC 125 MG IJ SOLR
125.0000 mg | Freq: Once | INTRAMUSCULAR | Status: AC
  Administered 2017-03-26: 125 mg via INTRAVENOUS
  Filled 2017-03-26: qty 2

## 2017-03-26 MED ORDER — VENLAFAXINE HCL ER 75 MG PO CP24
150.0000 mg | ORAL_CAPSULE | Freq: Every day | ORAL | Status: DC
  Administered 2017-03-27 – 2017-03-30 (×4): 150 mg via ORAL
  Filled 2017-03-26 (×4): qty 2

## 2017-03-26 MED ORDER — WHITE PETROLATUM EX OINT
TOPICAL_OINTMENT | CUTANEOUS | Status: DC | PRN
  Administered 2017-03-30: 06:00:00 via TOPICAL
  Filled 2017-03-26: qty 28.35

## 2017-03-26 MED ORDER — LORAZEPAM 2 MG/ML IJ SOLN
0.5000 mg | Freq: Once | INTRAMUSCULAR | Status: AC
  Administered 2017-03-26: 0.5 mg via INTRAVENOUS
  Filled 2017-03-26: qty 1

## 2017-03-26 NOTE — Plan of Care (Signed)
Patient progress in care plkan goals

## 2017-03-26 NOTE — Progress Notes (Signed)
Family Medicine Teaching Service Daily Progress Note Intern Pager: [phone]  Patient name: Juan [name] Medical record number: [id number] Date of birth: 1950-12-01 Age: 67 y.o. Gender: male  Primary Care Provider: Tobey Grim, MD Consultants: None Code Status: Full Code  Pt Overview and Major Events to Date:  Juan [name] is a 67 y.o. male presenting with difficulty breathing and hypoxia.  PMH is significant for COPD/bronchiectasis, HTN (not on medication), RBBB, CHF, A. fib (not on medication) hyponatremia described is not, hyponatremia, depression, anxiety, tobacco use disorder, alcohol use disorder, lung nodule  Assessment and Plan:  Acute respiratory failure/CAP: Acute. Stable. Patient still on BiPaP and felt like he could not come off this am.  pH is normal so I feel like his SOB is somewhat driven by some anxiety. Pneumonia also noted on portable CXR. Tachycardia and tachypnea improved with BiPAP and breathing treatments. Influenza PCR negative. Positive RVP for Rhinovirus/Enterovirus -Continue ceftriaxone (2/19-) and azithromycin (2/19-). -Con consider broadening to Zosyn for anaerobic and pseudomonal coverage if no improvement -Urine strep pneumo and Legionella pending -Diet n.p.o. while on BiPAP  COPD: Acute on Chronic. Respiratory failure likely due to pneumonia versus COPD exacerbation. However, he meets criteria for COPD exacerbation with dyspnea, chronic cough and sputum production.  Patient is on LAMA/LABA at home. Patient did receive one dose of Solu-Medrol in ED. - Discontinue BiPaP and move to nasal cannula after IV solumedrol - Cont Scheduled DuoNeb and as needed albuterol. - Continue steroid starting 2/20 - Resume home meds when no longer needing BiPaP  Hyperglycemia with anion gap: Acute. Resolved. AG this morning is 12 and CBGs have been improving with last check at 139 this am (2/20). Most likely due to steroid use. No history of diabetes or  hyperglycemia. HgbA1c of 6.4 so still in prediabetes range. - Cont Sliding scale - CBG ACHS  Paroxysmal atrial fibrillation: Chronic. Stable. Patient has had runs of tachycardia since admission but normal this morning at 85 (2/20). Chads vascular score 2-3 (not clear about history of hypertension).  Has bled 2 (due to alcohol use).  Not on any medication. -Anticoagulation medications should be discussed outpatient at discharge. - Consider adding rate control medication if rate worsens.  HFpEF: Chronic. Stable. Echo in 11/17 with EFof 50-55%, no structural or functional abnormality.  Previously as low as 30-35% in 2014. - Daily weights -> 96.1kg this morning down from 97kg on admission. - Strict I & O's -> net output of yesterday (2/19)  Hyponatremia: Sodium 129.  Corrected to 130 for hypoglycemia.  Baseline low 130s to mid 130s.  History of alcohol use.  Could have some element of beer potomania. -BMP in a.m.  Depression/anxiety: Stable. Patient did have anxiety yesterday.On Effexor and clonazepam at home. -Cont ativan 0.5mg  IV q8 until he is off BiPaP - Resume home medications once off BiPaP  Alcohol use disorder: Chronic. Stable. Reports drinking up to 3 beers a day.  Last drink this morning. CIWA scores have been zero. -CIWA protocol in place.  - Scheduled Ativan q8 0.5 mg  Tobacco use disorder: Smokes one and half pack cigarettes a day.   -Nicotine patch  FEN/GI: NPO while on BiPaP PPx: Lovenox  Disposition: home once stable  Subjective:  This morning the patient feels anxious and still is experiencing SOB but it is improved off of BiPaP. He states he is in no pain but very anxious and somewhat somnolent but easily arousable. Patient is concerned about going into "withdrawal" if  not getting his daily Effexor but assured patient that would not happen.  Patient has no other complaints at this time and denies chest pain, nausea, vomiting, abdominal pain, lower  extremity pain, still NPO.   Objective: Temp:  [96.9 F (36.1 C)-99 F (37.2 C)] 98.7 F (37.1 C) (02/19 2300) Pulse Rate:  [38-135] 85 (02/20 0600) Resp:  [14-41] 22 (02/20 0427) BP: (95-147)/(54-97) 98/64 (02/20 0500) SpO2:  [94 %-100 %] 97 % (02/20 0600) FiO2 (%):  [30 %] 30 % (02/20 0600) Weight:  [211 lb 13.8 oz (96.1 kg)-213 lb 13.5 oz (97 kg)] 211 lb 13.8 oz (96.1 kg) (02/20 0331) Physical Exam: Gen: Alert and Oriented x 3, NAD HEENT: Normocephalic, atraumatic, PERRLA Neck: No JVD CV: RRR, no murmurs, normal S1, S2 split, +2 pulses dorsalis pedis Resp: Wheezing in frontal lung fields, rales, or rhonchi, increased work of breathing on BiPaP Abd: non-distended, non-tender, soft, +bs in all four quadrants, large ventral wall hernia with superficial skin abrasions in multiple stages of healing MSK: FROM in all four extremities Ext: no clubbing, cyanosis, or edema Skin: warm, dry, intact, no rashes  Laboratory: Recent Labs  Lab 03/25/17 0858 03/25/17 1311 03/26/17 0325  WBC 19.5* 13.7* 9.6  HGB 16.2 15.6 15.1  HCT 45.5 45.2 43.4  PLT 213 193 227   Recent Labs  Lab 03/25/17 0858 03/25/17 1311 03/26/17 0325  NA 128*  --  130*  K 4.5  --  4.5  CL 92*  --  97*  CO2 20*  --  21*  BUN 11  --  19  CREATININE 0.90 0.93 0.97  CALCIUM 8.9  --  8.5*  PROT 8.3*  --   --   BILITOT 0.6  --   --   ALKPHOS 72  --   --   ALT 22  --   --   AST 27  --   --   GLUCOSE 237*  --  139*   Influenza: negative RVP: positive for Rhino/Enterovirus Strep: pending Legionella: pending HgbA1c: 6.4 Ethanol <10  Imaging/Diagnostic Tests: FINDINGS: Interim extubation. Cardiomegaly with normal pulmonary vascularity. Mild upper lung infiltrate. No pleural effusion or pneumothorax. Degenerative changes thoracic spine.  IMPRESSION: 1.  Interim extubation.  2.  Mild right upper lung infiltrate consistent pneumonia.  3.  Mild cardiomegaly.  No pulmonary venous  congestion.  Arlyce Harman, DO 03/26/2017, 6:51 AM PGY-1, [facility] Family Medicine FPTS Intern pager: [phone], text pages welcome

## 2017-03-26 NOTE — Progress Notes (Signed)
Patient had CIWA assessment performed, tremors noted along with complaints of "stuffy head" and agitation.  2mg  of Ativan given per ciwa scale orders.  Will continue to monitor patient.  Safety and comfort measures maintained, call bell remains within reach.  Wife by patient's side at this time.

## 2017-03-26 NOTE — Progress Notes (Signed)
Patient on Bipap 16/8/30%  NPO at this time due to Bipap.  SPO2 mid to upper 90's.  Patient complains of not having enough air.  Text sent to family medicine requesting prn ativan to help calm patient and decrease apprehensiveness.  Order received and given at 2157.  Wife remains by patient's side.  CIWA scale performed per order.  Will continue to monitor, and comtinue to maintain safety and comfort.  Call bell remains within reach.

## 2017-03-26 NOTE — Progress Notes (Signed)
Placed pt on bipap due to increase work of breathing. RT will continue to monitor as needed.

## 2017-03-27 LAB — BASIC METABOLIC PANEL
Anion gap: 10 (ref 5–15)
BUN: 24 mg/dL — AB (ref 6–20)
CHLORIDE: 100 mmol/L — AB (ref 101–111)
CO2: 24 mmol/L (ref 22–32)
Calcium: 8.5 mg/dL — ABNORMAL LOW (ref 8.9–10.3)
Creatinine, Ser: 0.9 mg/dL (ref 0.61–1.24)
GFR calc non Af Amer: >60 mL/min (ref >60)
Glucose, Bld: 132 mg/dL — ABNORMAL HIGH (ref 65–99)
POTASSIUM: 4.6 mmol/L (ref 3.5–5.1)
SODIUM: 134 mmol/L — AB (ref 135–145)

## 2017-03-27 LAB — CBC
HCT: 42.7 % (ref 39.0–52.0)
HEMATOCRIT: 43.4 % (ref 39.0–52.0)
HEMOGLOBIN: 14.6 g/dL (ref 13.0–17.0)
HEMOGLOBIN: 14.8 g/dL (ref 13.0–17.0)
MCH: 30.4 pg (ref 26.0–34.0)
MCH: 30.5 pg (ref 26.0–34.0)
MCHC: 34.2 g/dL (ref 30.0–36.0)
MCHC: 34.1 g/dL (ref 30.0–36.0)
MCV: 88.8 fL (ref 78.0–100.0)
MCV: 89.3 fL (ref 78.0–100.0)
Platelets: 235 10*3/uL (ref 150–400)
Platelets: 234 10*3/uL (ref 150–400)
RBC: 4.81 MIL/uL (ref 4.22–5.81)
RBC: 4.86 MIL/uL (ref 4.22–5.81)
RDW: 13.8 % (ref 11.5–15.5)
RDW: 13.9 % (ref 11.5–15.5)
WBC: 10.7 10*3/uL — ABNORMAL HIGH (ref 4.0–10.5)
WBC: 12.2 10*3/uL — AB (ref 4.0–10.5)

## 2017-03-27 LAB — GLUCOSE, CAPILLARY
GLUCOSE-CAPILLARY: 174 mg/dL — AB (ref 65–99)
GLUCOSE-CAPILLARY: 107 mg/dL — AB (ref 65–99)
Glucose-Capillary: 139 mg/dL — ABNORMAL HIGH (ref 65–99)
Glucose-Capillary: 180 mg/dL — ABNORMAL HIGH (ref 65–99)

## 2017-03-27 MED ORDER — LORAZEPAM 0.5 MG PO TABS
0.5000 mg | ORAL_TABLET | Freq: Two times a day (BID) | ORAL | Status: DC
  Administered 2017-03-27 (×2): 0.5 mg via ORAL
  Filled 2017-03-27 (×2): qty 1

## 2017-03-27 MED ORDER — CEFDINIR 125 MG/5ML PO SUSR
300.0000 mg | Freq: Two times a day (BID) | ORAL | Status: DC
  Administered 2017-03-27 – 2017-03-30 (×7): 300 mg via ORAL
  Filled 2017-03-27 (×8): qty 15

## 2017-03-27 NOTE — Progress Notes (Signed)
Patient offered a bath.  Wife stated that she will washi him in the morning.

## 2017-03-27 NOTE — Progress Notes (Signed)
Patient resting peacefully on 30% FIO2 via Bipap.  No complaints voiced.  Wife remains at bedside.  Safety and comfort measures maintained, call bell remains within reach.

## 2017-03-27 NOTE — Progress Notes (Signed)
Patient taken off of bipap and given scheduled AM nebulizer treatment.  Patient tolerated well with sats of 98%.  Once treatment was finished, placed patient on 3L nasal cannula.  Currently tolerating well and resting comfortably waiting on breakfast.  Will continue to monitor.

## 2017-03-27 NOTE — Care Management Note (Addendum)
Case Management Note  Patient Details  Name: Juan [name] MRN: [id number] Date of Birth: 1950-09-05  Subjective/Objective:  From home with wife, pta indep,presents withCAP, COPD, hyperglycemia, pafib, hyponatremia, depression/anxiety, etoh d/o, tobacco d/o.  NCM awaiting benefit check to see if home inhalers are covered by his insurance.  He has PCP and medication coverage.   2/22 1519 Letha Cape RN, BSN- NCM spoke with wife at bedside, she states they would like to look over the [facility] agency list and let the NCM know tomorrow who they would like to work with , she wants to see how they are rated on line.  She states they would like [facility] for the home oxygen though if he needs home oxygen, and they will get tub shower seat from [employer].                   Action/Plan: NCM will follow for transition of care needs.   Expected Discharge Date:  03/28/17               Expected Discharge Plan:  Home/Self Care  In-House Referral:     Discharge planning Services  CM Consult  Post Acute Care Choice:    Choice offered to:     DME Arranged:    DME Agency:     HH Arranged:    HH Agency:     Status of Service:  In process, will continue to follow  If discussed at Long Length of Stay Meetings, dates discussed:    Additional Comments:  Leone Haven, RN 03/27/2017, 4:06 PM

## 2017-03-27 NOTE — Progress Notes (Signed)
Patient resting comfortably on BIpap at 30% FIO2.  No signs or symptoms of distress.  Wife is by patient's side.  Will continue to monitor.  Safety and comfort measures maintained.  Call bell remains within reach.

## 2017-03-27 NOTE — Progress Notes (Signed)
PT Cancellation Note  Patient Details Name: ELVY BUDNY MRN: [id number] DOB: 19-Sep-1950   Cancelled Treatment:    Reason Eval/Treat Not Completed: Patient declined, no reason specified.   Pt reports not being able to catch his breath.  Will see pt 2/22 as able. 03/27/2017  Genoa Bing, PT [phone]  (pager)   Eliseo Gum Hance Caspers 03/27/2017, 6:38 PM

## 2017-03-27 NOTE — Progress Notes (Signed)
RT placed pt back on BIPAP. Pt tolerating well at this time.

## 2017-03-27 NOTE — Progress Notes (Signed)
#  [facility] # [phone] OPT- 2    INHALER'S  1. BROVANA MCG 15 / ML  COVER- NOT [place] # [phone]   2. PULMICORT 0.5 MG / 2ML  COVER- YES  CO-PAY- $ 100.00  TIER- 4 DRUG  PRIOR APPROVAL-NO   3. ATROVERT  0.02 %  COVER- YES  CO-PAY- $ 100.00  TIER- 4 DRUG  PRIOR APPROVAL- NO   4 . VENTLIN  COVER- YES  CO-PAY- $ 47.00  TIER- 3 DRUG  PRIOR APPROVAL- NO   5. SYMBICORT  COVER- YES  CO-PAY- $ 47.00  TIERT- 3 DRUG  PRIOR APPROVAL- NO   6. INCRUSE  COVER- YES  CO-PAY- $ 47.00  TIER- 3 DRUG  PRIOR APPROVAL- NO   DEDUCTIBLE -NOT MET

## 2017-03-27 NOTE — Progress Notes (Signed)
Pt wanted a break form BIPAP. RT took off mask and placed pt on 3L [place]. Pt tolerating well at this time. RT will continue to monitor.

## 2017-03-27 NOTE — Progress Notes (Signed)
Family Medicine Teaching Service Daily Progress Note Intern Pager: [phone]  Patient name: Juan [name] Medical record number: [id number] Date of birth: 1951/01/02 Age: 67 y.o. Gender: male  Primary Care Provider: Tobey Grim, MD Consultants: None Code Status: Full Code  Pt Overview and Major Events to Date:  Juan [name] is a 67 y.o. male presenting with difficulty breathing and hypoxia.  PMH is significant for COPD/bronchiectasis, HTN (not on medication), RBBB, CHF, A. fib (not on medication) hyponatremia described is not, hyponatremia, depression, anxiety, tobacco use disorder, alcohol use disorder, lung nodule  Assessment and Plan:  CAP: Acute. Improved. Patient is on nasal cannula  3L this am (2/21) and tolerated well. BiPaP last night. Pneumonia also noted on portable CXR. Tachycardia and tachypnea improved with BiPAP and breathing treatments. Influenza PCR negative. Positive RVP for Rhinovirus/Enterovirus - Continue Duonebs and albuterol as needed. - Stop ceftriaxone 2g IV (2/19-2/21) and start Cefdinir 300mg  (2/21-) BID azithromycin 250mg  oral (2/19-). Total of 5 days of treatment. - Con consider broadening to Zosyn for anaerobic and pseudomonal coverage if no improvement  COPD: Acute on Chronic. Respiratory failure likely due to pneumonia versus COPD exacerbation. Either way he is getting steroids and antibiotics. Patient is on LAMA/LABA at home. - Cont Scheduled DuoNeb and as needed albuterol. - Continue prednisone 50mg  (2/20-) for 5 day course - Cont home meds  Hyperglycemia: Acute. Improved. CBGs have been improving with last check at 132 this am (2/21). Most likely due to steroid use. No history of diabetes or hyperglycemia. HgbA1c of 6.4 so still in prediabetes range. - Cont Sliding scale - CBG ACHS  Paroxysmal atrial fibrillation: Chronic. Stable. Patient still having brief episodes of tachycardia. Normal this am (2/21) at 94. Chads vascular score 2-3  (not clear about history of hypertension). Has bled 2 (due to alcohol use).  Not on any medication. -Anticoagulation medications should be discussed outpatient at discharge. - Consider adding rate control medication if rate worsens.  HFpEF: Chronic. Stable. No active chest pain. Echo in 11/17 with EFof 50-55%, no structural or functional abnormality.  Previously as low as 30-35% in 2014. - Daily weights -> 96.1kg unchanged from yesterday (2/20) - Strict I & O's -> net output of yesterday (2/20)  Hyponatremia: Acute. Improved. Sodium up today to 134.  Corrected Baseline low 130s to mid 130s.  History of alcohol use.  Could have some element of beer potomania. - Cont BMP in a.m.  Depression/anxiety: Stable. Patient continues to have what appear to be panic attacks and is anxious to get home. Patient did have anxiety yesterday.On Effexor and clonazepam at home. -Cont ativan 0.5mg  IV q8 until he is off BiPaP - Resume home medications once off BiPaP  Alcohol use disorder: Chronic. Stable. Reports drinking up to 3 beers a day.  Last drink this morning. CIWA scores have been no higher than 6 in past 24hrs. - CIWA protocol in place.  - Scheduled Ativan q8 0.5 mg  Tobacco use disorder: Smokes one and half pack cigarettes a day.   -Nicotine patch  FEN/GI: NPO while on BiPaP PPx: Lovenox  Disposition: home once stable  Subjective:  This morning the patient feels improved. He is on nasal cannula 3L and is resting comfortably in bed with no respiratory distress. He states he has no chest pain. He endorses mild cough and continues to have "attacks" but he states "I've had these for all my life".  Patient has no other complaints at this time and denies  chest pain, nausea, vomiting, abdominal pain, lower extremity pain.  Objective: Temp:  [97.9 F (36.6 C)-98.3 F (36.8 C)] 97.9 F (36.6 C) (02/21 0442) Pulse Rate:  [81-115] 94 (02/21 0442) Resp:  [15-31] 26 (02/21 0442) BP:  (103-154)/(61-105) 141/97 (02/21 0442) SpO2:  [95 %-99 %] 97 % (02/21 0442) FiO2 (%):  [28 %-36 %] 30 % (02/21 0308) Weight:  [211 lb 13.8 oz (96.1 kg)] 211 lb 13.8 oz (96.1 kg) (02/21 0139) Physical Exam: Gen: Alert and Oriented x 3, NAD HEENT: Normocephalic, atraumatic, PERRLA Neck: No JVD CV: RRR, no murmurs, normal S1, S2 split, +2 pulses dorsalis pedis Resp:  Rhonchi in middle lung fields bilaterally, NWOB, no wheezing Abd: non-distended, non-tender, soft, +bs in all four quadrants, large ventral wall hernia with gauze in place. MSK: FROM in all four extremities Ext: no clubbing, cyanosis, or edema Skin: warm, dry, intact, no rashes  Laboratory: Recent Labs  Lab 03/25/17 1311 03/26/17 0325 03/27/17 0239  WBC 13.7* 9.6 10.7*  HGB 15.6 15.1 14.6  HCT 45.2 43.4 42.7  PLT 193 227 235   Recent Labs  Lab 03/25/17 0858 03/25/17 1311 03/26/17 0325 03/27/17 0239  NA 128*  --  130* 134*  K 4.5  --  4.5 4.6  CL 92*  --  97* 100*  CO2 20*  --  21* 24  BUN 11  --  19 24*  CREATININE 0.90 0.93 0.97 0.90  CALCIUM 8.9  --  8.5* 8.5*  PROT 8.3*  --   --   --   BILITOT 0.6  --   --   --   ALKPHOS 72  --   --   --   ALT 22  --   --   --   AST 27  --   --   --   GLUCOSE 237*  --  139* 132*   Influenza: negative RVP: positive for Rhino/Enterovirus Strep: pending Legionella: pending HgbA1c: 6.4 Ethanol <10  Imaging/Diagnostic Tests: FINDINGS: Interim extubation. Cardiomegaly with normal pulmonary vascularity. Mild upper lung infiltrate. No pleural effusion or pneumothorax. Degenerative changes thoracic spine.  IMPRESSION: 1.  Interim extubation. 2.  Mild right upper lung infiltrate consistent pneumonia. 3.  Mild cardiomegaly.  No pulmonary venous congestion.  Arlyce Harman, DO 03/27/2017, 6:37 AM PGY-1, [facility] Family Medicine FPTS Intern pager: [phone], text pages welcome

## 2017-03-28 LAB — CBC
HCT: 44.9 % (ref 39.0–52.0)
Hemoglobin: 15.3 g/dL (ref 13.0–17.0)
MCH: 30.4 pg (ref 26.0–34.0)
MCHC: 34.1 g/dL (ref 30.0–36.0)
MCV: 89.3 fL (ref 78.0–100.0)
Platelets: 234 10*3/uL (ref 150–400)
RBC: 5.03 MIL/uL (ref 4.22–5.81)
RDW: 13.9 % (ref 11.5–15.5)
WBC: 12.5 10*3/uL — ABNORMAL HIGH (ref 4.0–10.5)

## 2017-03-28 LAB — BASIC METABOLIC PANEL
Anion gap: 12 (ref 5–15)
BUN: 20 mg/dL (ref 6–20)
CALCIUM: 8.6 mg/dL — AB (ref 8.9–10.3)
CO2: 24 mmol/L (ref 22–32)
CREATININE: 0.83 mg/dL (ref 0.61–1.24)
Chloride: 100 mmol/L — ABNORMAL LOW (ref 101–111)
GFR calc non Af Amer: >60 mL/min (ref >60)
Glucose, Bld: 105 mg/dL — ABNORMAL HIGH (ref 65–99)
Potassium: 3.8 mmol/L (ref 3.5–5.1)
SODIUM: 136 mmol/L (ref 135–145)

## 2017-03-28 LAB — GLUCOSE, CAPILLARY
GLUCOSE-CAPILLARY: 204 mg/dL — AB (ref 65–99)
GLUCOSE-CAPILLARY: 190 mg/dL — AB (ref 65–99)
Glucose-Capillary: 240 mg/dL — ABNORMAL HIGH (ref 65–99)
Glucose-Capillary: 154 mg/dL — ABNORMAL HIGH (ref 65–99)

## 2017-03-28 MED ORDER — LORAZEPAM 0.5 MG PO TABS
0.5000 mg | ORAL_TABLET | Freq: Three times a day (TID) | ORAL | Status: DC
  Administered 2017-03-28 – 2017-03-30 (×6): 0.5 mg via ORAL
  Filled 2017-03-28 (×7): qty 1

## 2017-03-28 MED ORDER — HYDRALAZINE HCL 20 MG/ML IJ SOLN
5.0000 mg | INTRAMUSCULAR | Status: DC | PRN
  Administered 2017-03-28: 5 mg via INTRAVENOUS
  Filled 2017-03-28: qty 1

## 2017-03-28 MED ORDER — DILTIAZEM HCL 30 MG PO TABS
30.0000 mg | ORAL_TABLET | Freq: Four times a day (QID) | ORAL | Status: DC
  Administered 2017-03-28 – 2017-03-30 (×10): 30 mg via ORAL
  Filled 2017-03-28 (×10): qty 1

## 2017-03-28 NOTE — Evaluation (Signed)
Occupational Therapy Evaluation Patient Details Name: Juan [name] MRN: [id number] DOB: 05-02-1950 Today's Date: 03/28/2017    History of Present Illness Pt admitted 03/25/17 with CAP, acute on chronic respiratory failure, hyperglycemia and hyponatremia. Pt required bipap. PMH: COPD, afib, depression, anxiety, alcoholism, tobacco use, HTN, CHF, lung nodule.   Clinical Impression   Pt was independent in self care and mobility prior to admission. He reports a sedentary lifestyle. Pt presents with decreased activity tolerance and impaired standing balance. He demonstrates decreased awareness of safety and deficits. Pt requiring 2L 02 during activity to maintain sats in the low 90s. Will follow acutely.     Follow Up Recommendations  Home health OT;Supervision/Assistance - 24 hour    Equipment Recommendations  Tub/shower seat    Recommendations for Other Services       Precautions / Restrictions Precautions Precautions: Fall Restrictions Weight Bearing Restrictions: No      Mobility Bed Mobility Overal bed mobility: Independent                Transfers Overall transfer level: Needs assistance Equipment used: Rolling walker (2 wheeled) Transfers: Sit to/from Stand Sit to Stand: Min guard         General transfer comment: Pt needing steadying assist for stability.  Loses balance all directions.     Balance Overall balance assessment: Needs assistance Sitting-balance support: No upper extremity supported;Feet supported Sitting balance-Leahy Scale: Good     Standing balance support: Bilateral upper extremity supported;During functional activity Standing balance-Leahy Scale: Poor Standing balance comment: relies on UE support for balance.  Loses balance with and without RW requiring assist.             High level balance activites: Sudden stops;Head turns;Direction changes;Backward walking High Level Balance Comments: Pt needed min assist for balance.             ADL either performed or assessed with clinical judgement   ADL Overall ADL's : Needs assistance/impaired Eating/Feeding: Independent;Sitting   Grooming: Minimal assistance;Standing Grooming Details (indicate cue type and reason): for balance Upper Body Bathing: Minimal assistance;Sitting   Lower Body Bathing: Minimal assistance;Sit to/from stand   Upper Body Dressing : Set up;Sitting   Lower Body Dressing: Minimal assistance;Sit to/from stand Lower Body Dressing Details (indicate cue type and reason): can don socks crossing foot over opposite knee, min assist for pants due to impaired standing balance Toilet Transfer: Minimal assistance;Ambulation;RW   Toileting- Clothing Manipulation and Hygiene: Minimal assistance;Sit to/from stand       Functional mobility during ADLs: Minimal assistance;Rolling walker General ADL Comments: Pt giving incentive spirometer and educated to use 10x per hour. Instructed in pursed lip breathing with ambulation. Pt able to maintain Sp02 in low 90s on 2L 02.     Vision Baseline Vision/History: Macular Degeneration Patient Visual Report: No change from baseline Additional Comments: reports he can see to read, but not to drive     Perception     Praxis      Pertinent Vitals/Pain Pain Assessment: No/denies pain     Hand Dominance Right   Extremity/Trunk Assessment Upper Extremity Assessment Upper Extremity Assessment: Overall WFL for tasks assessed   Lower Extremity Assessment Lower Extremity Assessment: Defer to PT evaluation   Cervical / Trunk Assessment Cervical / Trunk Assessment: Kyphotic   Communication Communication Communication: No difficulties   Cognition Arousal/Alertness: Awake/alert Behavior During Therapy: WFL for tasks assessed/performed Overall Cognitive Status: Impaired/Different from baseline Area of Impairment: Safety/judgement;Memory  Memory: Decreased short-term memory    Safety/Judgement: Decreased awareness of safety;Decreased awareness of deficits         General Comments       Exercises     Shoulder Instructions      Home Living Family/patient expects to be discharged to:: Private residence Living Arrangements: Spouse/significant other Available Help at Discharge: Family;Available 24 hours/day Type of Home: House Home Access: Level entry     Home Layout: Multi-level;Able to live on main level with bedroom/bathroom     Bathroom Shower/Tub: Chief Strategy Officer: Standard     Home Equipment: Environmental consultant - 2 wheels;Cane - single point          Prior Functioning/Environment Level of Independence: Independent        Comments: doesn't drive due to poor vision        OT Problem List: Decreased activity tolerance;Impaired balance (sitting and/or standing);Decreased cognition;Decreased safety awareness;Decreased knowledge of use of DME or AE;Cardiopulmonary status limiting activity      OT Treatment/Interventions: Self-care/ADL training;DME and/or AE instruction;Cognitive remediation/compensation;Patient/family education;Balance training;Energy conservation    OT Goals(Current goals can be found in the care plan section) Acute Rehab OT Goals Patient Stated Goal: to go home OT Goal Formulation: With patient Time For Goal Achievement: 04/11/17 Potential to Achieve Goals: Good ADL Goals Pt Will Perform Grooming: with supervision;standing Pt Will Transfer to Toilet: with supervision;ambulating;regular height toilet Pt Will Perform Toileting - Clothing Manipulation and hygiene: with supervision;sit to/from stand Pt Will Perform Tub/Shower Transfer: Tub transfer;with supervision;ambulating;shower seat Additional ADL Goal #1: Pt will state at least 3 energy conservation strategies and demonstrate independence in pursed lip breathing.  OT Frequency: Min 2X/week   Barriers to D/C:            Co-evaluation PT/OT/SLP  Co-Evaluation/Treatment: Yes Reason for Co-Treatment: For patient/therapist safety PT goals addressed during session: Mobility/safety with mobility OT goals addressed during session: ADL's and self-care      AM-PAC PT "6 Clicks" Daily Activity     Outcome Measure Help from another person eating meals?: None Help from another person taking care of personal grooming?: A Little Help from another person toileting, which includes using toliet, bedpan, or urinal?: A Little Help from another person bathing (including washing, rinsing, drying)?: A Little Help from another person to put on and taking off regular upper body clothing?: None Help from another person to put on and taking off regular lower body clothing?: A Little 6 Click Score: 20   End of Session Equipment Utilized During Treatment: Gait belt;Rolling walker;Oxygen Nurse Communication: Mobility status  Activity Tolerance: Patient tolerated treatment well Patient left: in chair;with call bell/phone within reach;with chair alarm set;with family/visitor present  OT Visit Diagnosis: Unsteadiness on feet (R26.81);Other abnormalities of gait and mobility (R26.89);Other symptoms and signs involving cognitive function                Time: 7544-9201 OT Time Calculation (min): 23 min Charges:  OT General Charges $OT Visit: 1 Visit OT Evaluation $OT Eval Moderate Complexity: 1 Mod G-Codes:     2017-04-22 Martie Round, OTR/L Pager: [phone]  Iran Planas, [place] Bailiff 22-Apr-2017, 10:05 AM

## 2017-03-28 NOTE — Progress Notes (Signed)
SATURATION QUALIFICATIONS: (This note is used to comply with regulatory documentation for home oxygen)  Patient Saturations on Room Air at Rest = 90%  Patient Saturations on Room Air while Ambulating = 86%  Patient Saturations on 2 Liters of oxygen while Ambulating = 90%  Please briefly explain why patient needs home oxygen:Pt desat on RA with activity.  Sats stayed above 90% with 2LO2.  May need home O2.  Thanks.  [facility] Acute Rehabilitation [phone] (pager)

## 2017-03-28 NOTE — Progress Notes (Signed)
Family Medicine Teaching Service Daily Progress Note Intern Pager: [phone]  Patient name: Juan [name] Medical record number: [id number] Date of birth: 04-May-1950 Age: 67 y.o. Gender: male  Primary Care Provider: Tobey Grim, MD Consultants: None Code Status: Full Code  Pt Overview and Major Events to Date:  Juan [name] is a 67 y.o. male presenting with difficulty breathing and hypoxia.  PMH is significant for COPD/bronchiectasis, HTN (not on medication), RBBB, CHF, A. fib (not on medication) hyponatremia described is not, hyponatremia, depression, anxiety, tobacco use disorder, alcohol use disorder, lung nodule  Assessment and Plan:  CAP: Acute. Improved. Patient is on nasal cannula 2.5L this am (2/22) and tolerated well. Had BiPaP intermittently yesterday and but did well on nasal cannula over nigh. Positive RVP for Rhinovirus/Enterovirus - Continue Duonebs and albuterol as needed. - Stopped ceftriaxone 2g IV (2/19-2/21) and start Cefdinir 300mg  (2/21-) BID azithromycin 250mg  oral (2/19-). Total of 5 days of treatment to stop on 03/29/2017.  COPD: Acute on Chronic. Respiratory failure likely due to pneumonia versus COPD exacerbation. Either way he is getting steroids and antibiotics. Patient is on LAMA/LABA at home. - Patient has complicated regimen for control listed as his home meds. Case management has left a note detailing cost. Include pharm team for help in possible home regimen adjustment to make his controlling COPD meds more affordable and hopefully prevent exacerbations. - Cont Scheduled DuoNeb and as needed albuterol. - Continue prednisone 50mg  (2/20-) for 5 day course  Hyperglycemia: Acute. Improved. CBGs have been fairly well controlled in past 24 hrs with range of 180-107 (2/22). Most likely due to steroid use. No history of diabetes or hyperglycemia. HgbA1c of 6.4 so still in prediabetes range. - Cont Sliding scale - CBG ACHS  Paroxysmal atrial  fibrillation: Chronic. Stable. Patient still having brief episodes of tachycardia. Mildly tachycardic this am with pulse of 101 (2/22) at 94. Not on any medication. -Anticoagulation medications should be discussed outpatient at discharge. - Start Diltiazem 30mg  q6hrs.  HTN: Acute. Patient has no history of HTN and is not taking any medications. I suspect he much of his HTN is due to stress of illness. SBP 133-194 DBP 80-120 range with poor control over last 24hrs. - Add beta blocker and this would also lower his heart rate. - Consider amlodipine 5mg  as alternative.  - Add Hydralazine IV for SBP >180 and DBP > 120  HFpEF: Chronic. Stable. No active chest pain. Echo in 11/17 with EFof 50-55%, no structural or functional abnormality.  Previously as low as 30-35% in 2014. - Daily weights -> 95.7kg down 0.4kg from yesterday (2/22) - Strict I & O's -> output of yesterday, no input recorded (2/22)  Hyponatremia: Chronic. Resolved. Sodium up today to 136.  Corrected Baseline low 130s to mid 130s. History of alcohol use. Could have some element of beer potomania. - Cont BMP in a.m.  Depression/anxiety: Stable. Patient continues to have what appear to be panic attacks and is anxious to get home. Patient did have anxiety yesterday.On Effexor and clonazepam at home. - Cont ativan 0.5mg  oral BID - Cont Effexor 150mg  once daily at night  Alcohol use disorder: Chronic. Stable. Reports drinking up to 3 beers a day.  Last drink this morning. CIWA scores at 14-12 overnight (2/22). - CIWA protocol in place.  - Scheduled Ativan q8 0.5 mg  Tobacco use disorder: Smokes one and half pack cigarettes a day.   -Nicotine patch  FEN/GI: Reg diet PPx: Lovenox  Disposition:  home once stable  Subjective:  This morning the patient feels anxious and is concerned about care he is receiving. Talked extensively with his wife about various concerns about his blood pressure, heart rate, difficulty breathing,  and anxiety. Shared with patient and wife that he is currently in a vicious cycle where his anxiety his worsening based on his CAP and viral infection, which in turn worsen his anxiety. He is still having difficulty breathing, SOB but SOB comes in "attacks".   Patient wants to go home and discussed goals of going home with patient and wife today. Needs to stop using BiPaP, wean O2, and have an evaluation with PT/OT today.  Patient denies chest pain, abdominal pain, nausea, vomiting, difficulty urinating, constipation, or diarrhea.  Objective: Temp:  [97.7 F (36.5 C)-98.8 F (37.1 C)] 97.8 F (36.6 C) (02/22 0354) Pulse Rate:  [91-123] 100 (02/22 0600) Resp:  [20-34] 20 (02/22 0600) BP: (133-194)/(80-120) 153/111 (02/22 0600) SpO2:  [91 %-98 %] 96 % (02/22 0600) FiO2 (%):  [30 %-32 %] 30 % (02/21 2044) Weight:  [210 lb 15.7 oz (95.7 kg)] 210 lb 15.7 oz (95.7 kg) (02/22 0354) Physical Exam: Gen: Alert and Oriented x 3, NAD HEENT: Normocephalic, atraumatic, PERRLA, EOMI CV: RRR, no murmurs, normal S1, S2 split, +2 pulses dorsalis pedis bilaterally Resp: diffusely decreased breath sounds more in the lower lung fields, wheezing with some mild rhonchi heard diffusely, no crackles Abd: non-distended, non-tender, soft, +bs in all four quadrants; ventral wall hernia non-painful, reducible, wrapped with gauze MSK: FROM in all four extremities Ext: no clubbing, cyanosis, or edema Skin: warm, dry, intact, no rashes  Laboratory: Recent Labs  Lab 03/27/17 0239 03/27/17 0956 03/28/17 0345  WBC 10.7* 12.2* 12.5*  HGB 14.6 14.8 15.3  HCT 42.7 43.4 44.9  PLT 235 234 234   Recent Labs  Lab 03/25/17 0858  03/26/17 0325 03/27/17 0239 03/28/17 0345  NA 128*  --  130* 134* 136  K 4.5  --  4.5 4.6 3.8  CL 92*  --  97* 100* 100*  CO2 20*  --  21* 24 24  BUN 11  --  19 24* 20  CREATININE 0.90   < > 0.97 0.90 0.83  CALCIUM 8.9  --  8.5* 8.5* 8.6*  PROT 8.3*  --   --   --   --   BILITOT 0.6   --   --   --   --   ALKPHOS 72  --   --   --   --   ALT 22  --   --   --   --   AST 27  --   --   --   --   GLUCOSE 237*  --  139* 132* 105*   < > = values in this interval not displayed.   Influenza: negative RVP: positive for Rhino/Enterovirus Strep: pending Legionella: pending HgbA1c: 6.4 Ethanol <10  Imaging/Diagnostic Tests: FINDINGS: Interim extubation. Cardiomegaly with normal pulmonary vascularity. Mild upper lung infiltrate. No pleural effusion or pneumothorax. Degenerative changes thoracic spine.  IMPRESSION: 1.  Interim extubation. 2.  Mild right upper lung infiltrate consistent pneumonia. 3.  Mild cardiomegaly.  No pulmonary venous congestion.  Arlyce Harman, DO 03/28/2017, 6:50 AM PGY-1, [facility] Family Medicine FPTS Intern pager: [phone], text pages welcome

## 2017-03-28 NOTE — Evaluation (Signed)
Physical Therapy Evaluation Patient Details Name: Juan [name] MRN: [id number] DOB: 24-Mar-1950 Today's Date: 03/28/2017   History of Present Illness  Pt admitted 03/25/17 with CAP, acute on chronic respiratory failure, hyperglycemia and hyponatremia. Pt required bipap. PMH: COPD, afib, depression, anxiety, alcoholism, tobacco use, HTN, CHF, lung nodule.  Clinical Impression  Pt admitted with above diagnosis. Pt currently with functional limitations due to the deficits listed below (see PT Problem List). Pt with poor safety awareness overall with LOB at times needing min assist with RW.  Wife assists at home and states she can provide assist.   Pt will benefit from skilled PT to increase their independence and safety with mobility to allow discharge to the venue listed below.    SATURATION QUALIFICATIONS: (This note is used to comply with regulatory documentation for home oxygen)  Patient Saturations on Room Air at Rest = 90%  Patient Saturations on Room Air while Ambulating = 86%  Patient Saturations on 2 Liters of oxygen while Ambulating = 90%  Please briefly explain why patient needs home oxygen:Pt desat on RA with activity.  Sats stayed above 90% with 2LO2.  May need home O2.   Follow Up Recommendations Home health PT;Supervision/Assistance - 24 hour    Equipment Recommendations  (home O2)    Recommendations for Other Services       Precautions / Restrictions Precautions Precautions: Fall Restrictions Weight Bearing Restrictions: No      Mobility  Bed Mobility Overal bed mobility: Independent                Transfers Overall transfer level: Needs assistance Equipment used: Rolling walker (2 wheeled) Transfers: Sit to/from Stand Sit to Stand: Min guard         General transfer comment: Pt needing steadying assist for stability.  Loses balance all directions.   Ambulation/Gait Ambulation/Gait assistance: Min guard;Min assist;+2  safety/equipment Ambulation Distance (Feet): 250 Feet Assistive device: Rolling walker (2 wheeled) Gait Pattern/deviations: Step-through pattern;Decreased stride length;Trunk flexed;Staggering left;Staggering right;Leaning posteriorly   Gait velocity interpretation: Below normal speed for age/gender General Gait Details: Pt with several LOB needing physical assist to steady with and without RW.  Pt not aware of deficits causing safety issues and decr balance at times.  Pt demonstrates poor problem solving as well.   Stairs            Wheelchair Mobility    Modified Rankin (Stroke Patients Only)       Balance Overall balance assessment: Needs assistance Sitting-balance support: No upper extremity supported;Feet supported Sitting balance-Leahy Scale: Good     Standing balance support: Bilateral upper extremity supported;During functional activity Standing balance-Leahy Scale: Poor Standing balance comment: relies on UE support for balance.  Loses balance with and without RW requiring assist.             High level balance activites: Sudden stops;Head turns;Direction changes;Backward walking High Level Balance Comments: Pt needed min assist for balance.              Pertinent Vitals/Pain Pain Assessment: No/denies pain  106 - 125 bpm, See O2 sats above.   Home Living Family/patient expects to be discharged to:: Private residence Living Arrangements: Spouse/significant other Available Help at Discharge: Family;Available 24 hours/day Type of Home: House Home Access: Level entry     Home Layout: Multi-level;Able to live on main level with bedroom/bathroom Home Equipment: Dan Humphreys - 2 wheels;Cane - single point      Prior Function Level of Independence: Independent  Comments: doesn't drive due to poor vision     Hand Dominance   Dominant Hand: Right    Extremity/Trunk Assessment   Upper Extremity Assessment Upper Extremity Assessment: Overall  WFL for tasks assessed    Lower Extremity Assessment Lower Extremity Assessment: Defer to PT evaluation    Cervical / Trunk Assessment Cervical / Trunk Assessment: Kyphotic  Communication   Communication: No difficulties  Cognition Arousal/Alertness: Awake/alert Behavior During Therapy: WFL for tasks assessed/performed Overall Cognitive Status: Impaired/Different from baseline Area of Impairment: Safety/judgement;Memory                     Memory: Decreased short-term memory   Safety/Judgement: Decreased awareness of safety;Decreased awareness of deficits            General Comments      Exercises     Assessment/Plan    PT Assessment Patient needs continued PT services  PT Problem List Decreased activity tolerance;Decreased balance;Decreased mobility;Decreased knowledge of use of DME;Decreased safety awareness;Decreased strength;Decreased knowledge of precautions       PT Treatment Interventions DME instruction;Gait training;Functional mobility training;Therapeutic activities;Therapeutic exercise;Balance training;Patient/family education    PT Goals (Current goals can be found in the Care Plan section)  Acute Rehab PT Goals Patient Stated Goal: to go home PT Goal Formulation: With patient Time For Goal Achievement: 04/11/17 Potential to Achieve Goals: Good    Frequency Min 3X/week   Barriers to discharge        Co-evaluation PT/OT/SLP Co-Evaluation/Treatment: Yes Reason for Co-Treatment: For patient/therapist safety PT goals addressed during session: Mobility/safety with mobility OT goals addressed during session: ADL's and self-care       AM-PAC PT "6 Clicks" Daily Activity  Outcome Measure Difficulty turning over in bed (including adjusting bedclothes, sheets and blankets)?: None Difficulty moving from lying on back to sitting on the side of the bed? : None Difficulty sitting down on and standing up from a chair with arms (e.g., wheelchair,  bedside commode, etc,.)?: A Little Help needed moving to and from a bed to chair (including a wheelchair)?: A Lot Help needed walking in hospital room?: A Lot Help needed climbing 3-5 steps with a railing? : Total 6 Click Score: 16    End of Session Equipment Utilized During Treatment: Gait belt;Oxygen Activity Tolerance: Patient limited by fatigue Patient left: in chair;with call bell/phone within reach;with chair alarm set;with family/visitor present Nurse Communication: Mobility status PT Visit Diagnosis: Unsteadiness on feet (R26.81);Muscle weakness (generalized) (M62.81)    Time: 1610-9604 PT Time Calculation (min) (ACUTE ONLY): 23 min   Charges:   PT Evaluation $PT Eval Moderate Complexity: 1 Mod     PT G Codes:        Laurna Shetley,PT Acute Rehabilitation [phone] (pager)   Berline Lopes 03/28/2017, 10:03 AM

## 2017-03-29 LAB — GLUCOSE, CAPILLARY
GLUCOSE-CAPILLARY: 106 mg/dL — AB (ref 65–99)
GLUCOSE-CAPILLARY: 218 mg/dL — AB (ref 65–99)
Glucose-Capillary: 147 mg/dL — ABNORMAL HIGH (ref 65–99)
Glucose-Capillary: 177 mg/dL — ABNORMAL HIGH (ref 65–99)
Glucose-Capillary: 159 mg/dL — ABNORMAL HIGH (ref 65–99)

## 2017-03-29 LAB — CBC
HCT: 43.9 % (ref 39.0–52.0)
Hemoglobin: 14.7 g/dL (ref 13.0–17.0)
MCH: 30.1 pg (ref 26.0–34.0)
MCHC: 33.5 g/dL (ref 30.0–36.0)
MCV: 90 fL (ref 78.0–100.0)
PLATELETS: 234 10*3/uL (ref 150–400)
RBC: 4.88 MIL/uL (ref 4.22–5.81)
RDW: 13.7 % (ref 11.5–15.5)
WBC: 9.5 10*3/uL (ref 4.0–10.5)

## 2017-03-29 LAB — BASIC METABOLIC PANEL
Anion gap: 10 (ref 5–15)
BUN: 18 mg/dL (ref 6–20)
CALCIUM: 8.4 mg/dL — AB (ref 8.9–10.3)
CO2: 26 mmol/L (ref 22–32)
Chloride: 98 mmol/L — ABNORMAL LOW (ref 101–111)
Creatinine, Ser: 0.78 mg/dL (ref 0.61–1.24)
GFR calc Af Amer: >60 mL/min (ref >60)
Glucose, Bld: 104 mg/dL — ABNORMAL HIGH (ref 65–99)
POTASSIUM: 3.7 mmol/L (ref 3.5–5.1)
SODIUM: 134 mmol/L — AB (ref 135–145)

## 2017-03-29 MED ORDER — ORAL CARE MOUTH RINSE
15.0000 mL | Freq: Two times a day (BID) | OROMUCOSAL | Status: DC
  Administered 2017-03-29 – 2017-03-30 (×3): 15 mL via OROMUCOSAL

## 2017-03-29 MED ORDER — ACETAMINOPHEN 325 MG PO TABS
650.0000 mg | ORAL_TABLET | Freq: Four times a day (QID) | ORAL | Status: DC | PRN
  Administered 2017-03-29 – 2017-03-30 (×2): 650 mg via ORAL
  Filled 2017-03-29 (×2): qty 2

## 2017-03-29 NOTE — Progress Notes (Signed)
03/29/2017 patient ambulate on room air at 1840. Saturation was 90 to 93% on room air. Patient saturation decrease when he was near the room, saturation drop 88 to 87%. Patient was at rest in less than five minutes saturation increase 90 to 94%. [facility] RN.

## 2017-03-29 NOTE — Progress Notes (Signed)
Family Medicine Teaching Service Daily Progress Note Intern Pager: [phone]  Patient name: Juan [name] Medical record number: [id number] Date of birth: [birth year] Age: 67 y.o. Gender: male  Primary Care Provider: Tobey Grim, MD Consultants: None Code Status: Full Code  Pt Overview and Major Events to Date:  Juan [name] is a 67 y.o. male presenting with difficulty breathing and hypoxia found to have acute respiratory failure from CAP with positive rhinovirus.  PMH is significant for COPD/bronchiectasis, HTN (not on medication), RBBB, CHF, A. fib (not on medication), hyponatremia, depression, anxiety, tobacco use disorder, alcohol use disorder, lung nodule  Assessment and Plan:  CAP: Acute. Improved. Patient is on nasal cannula 3L this am (2/23) and tolerated well.  Positive RVP for Rhinovirus/Enterovirus - Continue Duonebs and albuterol as needed. - Stopped ceftriaxone 2g IV (2/19-2/21) and start Cefdinir 300mg  (2/21-) BID azithromycin 250mg  oral (2/19-). Total of 5 days of treatment to stop on 03/29/2017. Last day today.  - PT/OT: HH OT with 24 hr supervision, tub/shower seat, HHPT.   COPD: Acute on Chronic. Respiratory failure likely due to pneumonia versus COPD exacerbation. Either way he is getting steroids and antibiotics. Patient is on LAMA/LABA at home. - Patient has complicated regimen for control listed as his home meds. Case management has left a note detailing cost. Include pharm team for help in possible home regimen adjustment to make his controlling COPD meds more affordable and hopefully prevent exacerbations. - Cont Scheduled DuoNeb and as needed albuterol. - Continue prednisone 50mg  (2/20-) for 5 day course  Hyperglycemia: Acute. Improved. CBGs have been fairly well controlled in past 24 hrs. Most likely due to steroid use. No history of diabetes or hyperglycemia. HgbA1c of 6.4 so still in prediabetes range. - Cont Sliding scale - CBG ACHS  Paroxysmal  atrial fibrillation: Chronic. Stable. Patient still having brief episodes of tachycardia. No medication at home.  - Anticoagulation medications should be discussed outpatient at discharge. - Diltiazem 30mg  q6hrs started 2/22   HTN: Acute. Patient has no history of HTN and is not taking any medications. I suspect he much of his HTN is due to stress of illness. SBP 133-194 DBP 80-120 range with poor control over last 24hrs. - Diltiazem started 2/22  - Add Hydralazine IV for SBP >180 and DBP > 120  HFpEF: Chronic. Stable. No active chest pain. Echo in 11/17 with EFof 50-55%, no structural or functional abnormality.  Previously as low as 30-35% in 2014. - Daily weights ->stable - Strict I & O's -> not recorded  Hyponatremia: Chronic. Improved. [id number].  Corrected Baseline low 130s to mid 130s. History of alcohol use. Could have some element of beer potomania. - Cont BMP in a.m.  Depression/anxiety: Stable. Patient continues to have what appear to be panic attacks and is anxious to get home. Patient did have anxiety yesterday.On Effexor and clonazepam at home. - ativan 0.5mg  oral BID > increased to TID due to elevated CIWA scores (note below) - Cont Effexor 150mg  once daily at night  Alcohol use disorder: Chronic. Stable. Reports drinking up to 3 beers a day.  Last drink this morning. CIWA scores at 8,1,2,6. - CIWA protocol in place.  - Scheduled Ativan q8 0.5 mg  Tobacco use disorder: Smokes one and half pack cigarettes a day.   -Nicotine patch  FEN/GI: Reg diet PPx: Lovenox  Disposition: home once stable  Subjective:  Reports he is doing better today. Reports shortness of breath is improving but still has some dyspnea  on exertion.  Patient denies chest pain, abdominal pain, nausea, vomiting, difficulty urinating, constipation, or diarrhea.  Objective: Temp:  [97.9 F (36.6 C)-98.8 F (37.1 C)] 98.3 F (36.8 C) (02/23 0537) Pulse Rate:  [80-121] 91 (02/23 0537) Resp:   [16-30] 20 (02/23 0537) BP: (133-178)/(77-110) 158/110 (02/23 0626) SpO2:  [91 %-99 %] 94 % (02/23 0537) Weight:  [210 lb 1.6 oz (95.3 kg)] 210 lb 1.6 oz (95.3 kg) (02/23 0537) Physical Exam: Gen: Alert and Oriented x 3, NAD HEENT: Normocephalic, atraumatic, PERRLA, EOMI CV: RRR, no murmurs, normal S1, S2 split, +2 pulses dorsalis pedis bilaterally Resp: diffusely decreased breath sounds more in the lower lung fields, wheezing with some mild rhonchi heard diffusely, no crackles Abd: non-distended, non-tender, soft, +bs in all four quadrants; ventral wall hernia non-painful, reducible, wrapped with gauze MSK: FROM in all four extremities Ext: no clubbing, cyanosis, or edema Skin: warm, dry, intact, no rashes  Laboratory: Recent Labs  Lab 03/27/17 0956 03/28/17 0345 03/29/17 0237  WBC 12.2* 12.5* 9.5  HGB 14.8 15.3 14.7  HCT 43.4 44.9 43.9  PLT 234 234 234   Recent Labs  Lab 03/25/17 0858  03/27/17 0239 03/28/17 0345 03/29/17 0237  NA 128*   < > 134* 136 134*  K 4.5   < > 4.6 3.8 3.7  CL 92*   < > 100* 100* 98*  CO2 20*   < > 24 24 26   BUN 11   < > 24* 20 18  CREATININE 0.90   < > 0.90 0.83 0.78  CALCIUM 8.9   < > 8.5* 8.6* 8.4*  PROT 8.3*  --   --   --   --   BILITOT 0.6  --   --   --   --   ALKPHOS 72  --   --   --   --   ALT 22  --   --   --   --   AST 27  --   --   --   --   GLUCOSE 237*   < > 132* 105* 104*   < > = values in this interval not displayed.   Influenza: negative RVP: positive for Rhino/Enterovirus Strep: pending Legionella: pending HgbA1c: 6.4 Ethanol <10  Imaging/Diagnostic Tests: FINDINGS: Interim extubation. Cardiomegaly with normal pulmonary vascularity. Mild upper lung infiltrate. No pleural effusion or pneumothorax. Degenerative changes thoracic spine.  IMPRESSION: 1.  Interim extubation. 2.  Mild right upper lung infiltrate consistent pneumonia. 3.  Mild cardiomegaly.  No pulmonary venous congestion.  Palma Holter,  MD 03/29/2017, 7:14 AM PGY-3, [facility] FPTS Intern pager: [phone], text pages welcome

## 2017-03-30 LAB — BASIC METABOLIC PANEL
Anion gap: 11 (ref 5–15)
BUN: 15 mg/dL (ref 6–20)
CALCIUM: 8.6 mg/dL — AB (ref 8.9–10.3)
CO2: 25 mmol/L (ref 22–32)
CREATININE: 0.78 mg/dL (ref 0.61–1.24)
Chloride: 95 mmol/L — ABNORMAL LOW (ref 101–111)
GFR calc Af Amer: >60 mL/min (ref >60)
GFR calc non Af Amer: >60 mL/min (ref >60)
Glucose, Bld: 117 mg/dL — ABNORMAL HIGH (ref 65–99)
Potassium: 4 mmol/L (ref 3.5–5.1)
SODIUM: 131 mmol/L — AB (ref 135–145)

## 2017-03-30 LAB — GLUCOSE, CAPILLARY
GLUCOSE-CAPILLARY: 179 mg/dL — AB (ref 65–99)
Glucose-Capillary: 111 mg/dL — ABNORMAL HIGH (ref 65–99)
Glucose-Capillary: 211 mg/dL — ABNORMAL HIGH (ref 65–99)

## 2017-03-30 MED ORDER — DILTIAZEM HCL 30 MG PO TABS: 30.0000 mg | ORAL_TABLET | Freq: Four times a day (QID) | ORAL | 0 refills | Status: DC

## 2017-03-30 NOTE — Progress Notes (Addendum)
SATURATION QUALIFICATIONS: (This note is used to comply with regulatory documentation for home oxygen)  Patient Saturations on Room Air at Rest = 95%  Patient Saturations on Room Air while Ambulating = 89 to 93%  Patient Saturations on 1 Liters of oxygen while Ambulating = 91 to 93%  And patient recover in <20 sec  Please briefly explain why patient needs home oxygen: Patient get short of breath.

## 2017-03-30 NOTE — Plan of Care (Signed)
Pt is progressing in all areas. Pt did well being off of oxygen for most of the night. O2 saturation stayed above 92%.

## 2017-03-30 NOTE — Discharge Instructions (Signed)
You were admitted to the hospital and treated for Community Acquired Pneumonia. You were treated with antibiotics and steroids and required oxygen to help you breath. It is important to have outpatient follow up to ensure you are recovering well.

## 2017-03-30 NOTE — Care Management (Addendum)
Spoke with patient this am.  HH list at bedside.  Pt states he and wife do not think he needs HH therapy or any DME at this time.  He is aware that if he changes his mind prior to d/c, CM can set up [facility] or PMD can set up [facility].  Pt states he would like to go home and then set up [facility] with PMD as needed.  Pt has no DME oxygen.  Bedside RN aware.   Ambulatory saturations from yesterday are 86% on RA.  Ambulatory saturation while walking on RA today 87%-91%.  Pt has been in 90%'s while at rest all day yesterday and today thus far.  Pt recovers at rest within seconds on RA.  Pt slept on RA without problem last night.  Pt states he is open to having O2 at home if ordered but would rather not, and has no preference of agency.  Pt is ok with using [facility] if DME o2 ordered.

## 2017-03-30 NOTE — Care Management (Signed)
Last saturation qualifying note does not qualify patient for oxygen.  Nadine, bedside RN to discuss with MD.  Pt and wife state they are comfortable going home without oxygen. They also state they do not need the ordered tub bench.  They already have a piece of equipment to meet that need.

## 2017-03-30 NOTE — Progress Notes (Addendum)
Family Medicine Teaching Service Daily Progress Note Intern Pager: [phone]  Patient name: Juan [name] Medical record number: [id number] Date of birth: 12-02-1950 Age: 67 y.o. Gender: male  Primary Care Provider: Tobey Grim, MD Consultants: None Code Status: Full Code  Pt Overview and Major Events to Date:  Juan [name] is a 67 y.o. male presenting with difficulty breathing and hypoxia found to have acute respiratory failure from CAP with positive rhinovirus.  PMH is significant for COPD/bronchiectasis, HTN (not on medication), RBBB, CHF, A. fib (not on medication), hyponatremia, depression, anxiety, tobacco use disorder, alcohol use disorder, lung nodule  Assessment and Plan:  CAP: Acute. Improved. Patient is on room air this am (2/24) and tolerated well.  Positive RVP for Rhinovirus/Enterovirus - Continue Duonebs and albuterol as needed. - Stopped ceftriaxone 2g IV (2/19-2/21) and completed Cefdinir 300mg  (2/21-2/23) BID azithromycin 250mg  oral (2/19-23). Total of 5 days of treatment completed. - PT/OT: HH OT with 24 hr supervision, tub/shower seat, HHPT.   COPD: Acute on Chronic. Respiratory failure likely due to pneumonia versus COPD exacerbation. Either way he is getting steroids and antibiotics. Patient is on LAMA/LABA at home. - Cont Scheduled DuoNeb and as needed albuterol. - Continue prednisone 50mg  (2/20-) for total of 5 day course. Ending today. Had IV Solumedrol on 2/21.  Hyperglycemia: Acute. Improved. CBGs have been well controlled in past 24 hrs range of 104-218. Most likely due to steroid use. No history of diabetes or hyperglycemia. HgbA1c of 6.4 so still in prediabetes range. Needing 6U of Novolog over past 24hrs. - Cont Sliding scale - CBG ACHS  Paroxysmal atrial fibrillation: Chronic. Stable. Patient NSR this am. - Anticoagulation medications should be discussed outpatient at discharge. - Cont Diltiazem 30mg  q6hrs  HTN: Acute. Patient has no  history of HTN and is not taking any medications. I suspect he much of his HTN is due to stress of illness. SBP 139-169 DBP 78-98 range with poor control over last 24hrs. - Cont Diltiazem - Cont Hydralazine IV for SBP >180 and DBP > 120  HFpEF: Chronic. Stable. No active chest pain. Echo in 11/17 with EFof 50-55%, no structural or functional abnormality.  Previously as low as 30-35% in 2014. - Daily weights ->stable. Up 3lbs from admission. Question accuracy of weights. - Strict I & O's -> net output of on 2/23  Hyponatremia: Chronic. Improved. Sodium 134.  Corrected Baseline low 130s to mid 130s. History of alcohol use. Could have some element of beer potomania. - Cont BMP in a.m.  Depression/anxiety: Stable. Patient continues to have what appear to be panic attacks and is anxious to get home. Patient did have anxiety yesterday.On Effexor and clonazepam at home. - ativan 0.5mg  oral BID > increased to TID due to elevated CIWA scores (note below) - Cont Effexor 150mg  once daily at night  Alcohol use disorder: Chronic. Stable. Reports drinking up to 3 beers a day.  Last drink this morning. CIWA scores over past 24 have been 6 or less. - CIWA protocol in place.  - Cont Scheduled Ativan q8 0.5 mg  Tobacco use disorder: Smokes one and half pack cigarettes a day.   -Nicotine patch  FEN/GI: Reg diet PPx: Lovenox  Disposition: home once stable  Subjective:  Reports he is doing well. He is on room air and expressed no difficulty breathing. He states he feels well and still has some congestion but is not SOB at rest, no chest pain, nausea, vomiting, or abdominal pain. He is  well appearing and ready to go home today.  Objective: Temp:  [97.6 F (36.4 C)-99.2 F (37.3 C)] 97.6 F (36.4 C) (02/24 0544) Pulse Rate:  [80-95] 92 (02/24 0544) Resp:  [16-26] 16 (02/24 0544) BP: (139-169)/(78-98) 158/92 (02/24 0616) SpO2:  [91 %-98 %] 91 % (02/24 0544) Weight:  [213 lb 10 oz (96.9 kg)]  213 lb 10 oz (96.9 kg) (02/24 0557) Physical Exam: Gen: Alert and Oriented x 3, NAD HEENT: Normocephalic, atraumatic, PERRLA, EOMI CV: RRR, no murmurs, normal S1, S2 split Resp: diffuse wheezing and decreased breath sounds throughout lung fields bilaterally. More in bases. NWOB. Abd: non-distended, non-tender, soft, +bs in all four quadrants, ventral wall hernia wrapped. MSK: moves all four extremities Ext: no clubbing, cyanosis, or edema Skin: warm, dry, intact, no rashes  Laboratory: Recent Labs  Lab 03/27/17 0956 03/28/17 0345 03/29/17 0237  WBC 12.2* 12.5* 9.5  HGB 14.8 15.3 14.7  HCT 43.4 44.9 43.9  PLT 234 234 234   Recent Labs  Lab 03/25/17 0858  03/27/17 0239 03/28/17 0345 03/29/17 0237  NA 128*   < > 134* 136 134*  K 4.5   < > 4.6 3.8 3.7  CL 92*   < > 100* 100* 98*  CO2 20*   < > 24 24 26   BUN 11   < > 24* 20 18  CREATININE 0.90   < > 0.90 0.83 0.78  CALCIUM 8.9   < > 8.5* 8.6* 8.4*  PROT 8.3*  --   --   --   --   BILITOT 0.6  --   --   --   --   ALKPHOS 72  --   --   --   --   ALT 22  --   --   --   --   AST 27  --   --   --   --   GLUCOSE 237*   < > 132* 105* 104*   < > = values in this interval not displayed.   Influenza: negative RVP: positive for Rhino/Enterovirus Strep: pending Legionella: pending HgbA1c: 6.4 Ethanol <10  Imaging/Diagnostic Tests: FINDINGS: Interim extubation. Cardiomegaly with normal pulmonary vascularity. Mild upper lung infiltrate. No pleural effusion or pneumothorax. Degenerative changes thoracic spine.  IMPRESSION: 1.  Interim extubation. 2.  Mild right upper lung infiltrate consistent pneumonia. 3.  Mild cardiomegaly.  No pulmonary venous congestion.  Arlyce Harman, DO 03/30/2017, 7:19 AM PGY-1, [facility] Family Medicine FPTS Intern pager: [phone], text pages welcome

## 2017-03-30 NOTE — Progress Notes (Signed)
SATURATION QUALIFICATIONS: (This note is used to comply with regulatory documentation for home oxygen)  Patient Saturations on Room Air at Rest = 93%  Patient Saturations on Room Air while Ambulating = 87 to 91%  Patient Saturations on Liters of oxygen while Ambulating = did not place on oxygen. He recover in <20 sec at 92%  Please briefly explain why patient needs home oxygen: because sometimes patient get shortness of breath while ambulating.

## 2017-03-30 NOTE — Progress Notes (Addendum)
SATURATION QUALIFICATIONS: (This note is used to comply with regulatory documentation for home oxygen)  Patient Saturations on Room Air at Rest = 93%  Patient Saturations on Room Air while Ambulating = 88%  Patient Saturations on 1 Liters of oxygen while Ambulating = 93%  Please briefly explain why patient needs home oxygen: Patient get shortness of breath while ambulating.

## 2017-03-30 NOTE — Care Management (Signed)
Ambulatory saturations repeated and pt qualifies for oxygen.   DME oxygen order needed to be corrected and had to wait for MD to fix and sign. Order and documentation now correct.  O2 to be delivered by [facility].

## 2017-03-30 NOTE — Progress Notes (Signed)
03/30/2017 patient was discharge at 1840. He was alert and oriented. Patient did received oxygen from [facility]. Rn did go over discharge paper with patient and wife. [facility] RN.

## 2017-03-31 DIAGNOSIS — R69 Illness, unspecified: Secondary | ICD-10-CM | POA: Diagnosis not present

## 2017-04-01 DIAGNOSIS — R69 Illness, unspecified: Secondary | ICD-10-CM | POA: Diagnosis not present

## 2017-04-03 ENCOUNTER — Inpatient Hospital Stay: Payer: Self-pay | Admitting: Family Medicine

## 2017-04-08 ENCOUNTER — Ambulatory Visit (INDEPENDENT_AMBULATORY_CARE_PROVIDER_SITE_OTHER): Payer: Medicare HMO | Admitting: Family Medicine

## 2017-04-08 ENCOUNTER — Encounter: Payer: Self-pay | Admitting: Family Medicine

## 2017-04-08 ENCOUNTER — Ambulatory Visit (HOSPITAL_COMMUNITY)
Admission: RE | Admit: 2017-04-08 | Discharge: 2017-04-08 | Disposition: A | Discharge status: Home / Self Care | Payer: Medicare HMO | Source: Ambulatory Visit | Attending: Family Medicine | Admitting: Family Medicine

## 2017-04-08 ENCOUNTER — Other Ambulatory Visit: Payer: Self-pay

## 2017-04-08 VITALS — BP 128/62 | HR 44 | Temp 98.3°F | Ht 68.0 in | Wt 204.6 lb

## 2017-04-08 DIAGNOSIS — I5032 Chronic diastolic (congestive) heart failure: Secondary | ICD-10-CM

## 2017-04-08 DIAGNOSIS — I493 Ventricular premature depolarization: Secondary | ICD-10-CM | POA: Diagnosis not present

## 2017-04-08 DIAGNOSIS — N39490 Overflow incontinence: Secondary | ICD-10-CM | POA: Insufficient documentation

## 2017-04-08 DIAGNOSIS — R0989 Other specified symptoms and signs involving the circulatory and respiratory systems: Secondary | ICD-10-CM

## 2017-04-08 DIAGNOSIS — R001 Bradycardia, unspecified: Secondary | ICD-10-CM | POA: Diagnosis not present

## 2017-04-08 DIAGNOSIS — J181 Lobar pneumonia, unspecified organism: Secondary | ICD-10-CM | POA: Diagnosis not present

## 2017-04-08 DIAGNOSIS — J189 Pneumonia, unspecified organism: Secondary | ICD-10-CM

## 2017-04-08 DIAGNOSIS — I4891 Unspecified atrial fibrillation: Secondary | ICD-10-CM | POA: Diagnosis not present

## 2017-04-08 DIAGNOSIS — I4510 Unspecified right bundle-branch block: Secondary | ICD-10-CM | POA: Diagnosis not present

## 2017-04-08 DIAGNOSIS — R69 Illness, unspecified: Secondary | ICD-10-CM | POA: Diagnosis not present

## 2017-04-08 MED ORDER — IPRATROPIUM BROMIDE 0.02 % IN SOLN: 0.5000 mg | Freq: Four times a day (QID) | RESPIRATORY_TRACT | 3 refills | Status: DC

## 2017-04-08 MED ORDER — IPRATROPIUM-ALBUTEROL 20-100 MCG/ACT IN AERS: INHALATION_SPRAY | RESPIRATORY_TRACT | 6 refills | Status: DC

## 2017-04-08 NOTE — Patient Instructions (Signed)
It was good to see you again today.  Stop the cardizem.  If you notice your heart rate jumps back above 100, restart it.  If you have any skipped beats or palpitations, restart it.  Come back and see me about 1 month after you see the cardiologist.   I think a lot of your fatigue is also from the pneumonia you are still recovering from.    Checking some labs today as well.

## 2017-04-08 NOTE — Progress Notes (Signed)
Subjective:    Juan [name] is a 67 y.o. male who presents to [facility] today for hospital FU:  1.  Hospital FU:  Patient with recent admission for CAP.  Discharged just over a week ago.  Since then, states his overall congestion is better.  Still using his nebulizers and inhalers as prescribed.  Strength is slowly improving.  Does feel fairly fatigued most days.  Sleeping more than usual.  Will take 3-4 hour nap every afternoon.  Cough is dry and hacking and his usual COPD/bronchiectasis cough.  No fevers or chills.  No weight changes. No appetite changes.  Does not that his home pulse oximeter ranges 90-95%.  Has home portable oxygen that he only uses for comfort or if O2 drops after walking fair amount.  He has noticed several times where his pulse drops to the 40-50s.  This is inconsistent and it usually runs 80s-90s.  No lightheaded symptoms when this occurs.  No chest pain or palpitations or dyspnea either.  His DOE is fairly consistent and occurs when he is walking around house for more than usual amount.  No chest pain during that time.     ROS as above per HPI.    The following portions of the patient's history were reviewed and updated as appropriate: allergies, current medications, past medical history, family and social history, and problem list. Patient is a nonsmoker.    PMH reviewed.  Past Medical History:  Diagnosis Date  . Alcoholism (HCC)   . Allergy    seasonal   . Anxiety   . Asthma   . CHF (congestive heart failure) (HCC) 10/2010   ECHO:  EF 40%, Grade II diastolic dysfunction  . COPD (chronic obstructive pulmonary disease) (HCC)   . Depression   . Headache   . Hyperlipidemia   . Hypertension   . Seizures (HCC)    Past Surgical History:  Procedure Laterality Date  . TOENAIL EXCISION Right 1967   Removal of ingrown toenail [Other]    Medications reviewed. Current Outpatient Medications  Medication Sig Dispense Refill  . acetaminophen (TYLENOL) 325 MG tablet  Take 2 tablets (650 mg total) by mouth every 6 (six) hours as needed for moderate pain. 30 tablet 0  . albuterol (PROVENTIL) (2.5 MG/3ML) 0.083% nebulizer solution INHALE 1 VIAL VIA NEBULIZER EVERY 6 HOURS AS NEEDED FOR WHEEZING OR SHORTNESS OF BREATH 375 mL 1  . aspirin 81 MG chewable tablet Chew 1 tablet (81 mg total) by mouth daily. (Patient not taking: Reported on 03/25/2017) 30 tablet 0  . budesonide (PULMICORT) 0.5 MG/2ML nebulizer solution Take 2 mLs (0.5 mg total) by nebulization 2 (two) times daily. 2 mL 3  . clonazePAM (KLONOPIN) 0.5 MG tablet TAKE 1 TABLET BY MOUTH 2 TIMES DAILY AS NEEDED FOR ANXIETY. (Patient taking differently: Take 0.5 mg by mouth 2 (two) times daily. ) 90 tablet 2  . diltiazem (CARDIZEM) 30 MG tablet Take 1 tablet (30 mg total) by mouth every 6 (six) hours. 30 tablet 0  . Doxylamine-DM (VICKS NYQUIL COUGH) 6.25-15 MG/15ML LIQD Take 30 mLs by mouth at bedtime as needed (for sleep== nyquil).    Marland Kitchen ePHEDrine-GuaiFENesin (PRIMATENE ASTHMA) 12.5-200 MG TABS Take 2 tablets by mouth as needed (for breathing == primatene).    Marland Kitchen EPINEPHrine Base (PRIMATENE MIST IN) Inhale 1 puff into the lungs as needed (for breathing).    . hydroxypropyl methylcellulose / hypromellose (ISOPTO TEARS / GONIOVISC) 2.5 % ophthalmic solution Place 1 drop into both eyes  3 (three) times daily as needed for dry eyes. 15 mL 0  . ipratropium (ATROVENT) 0.02 % nebulizer solution TAKE 2.5 MLS (0.5 MG TOTAL) BY NEBULIZATION 4 (FOUR) TIMES DAILY. 62.5 mL 0  . Ipratropium-Albuterol (COMBIVENT RESPIMAT) 20-100 MCG/ACT AERS respimat INHALE 1-2 PUFFS INTO THE LUNGS 4 (FOUR) TIMES DAILY AS NEEDED FOR WHEEZING. 4 g 6  . Multiple Vitamin (MULTIVITAMIN WITH MINERALS) TABS tablet Take 1 tablet by mouth daily as needed (for vitamin).     . Olodaterol HCl 2.5 MCG/ACT AERS Inhale 2 puffs daily into the lungs. 4 g 2  . phenylephrine (4-WAY FAST ACTING) 1 % nasal spray Place 1 drop into both nostrils every 6 (six) hours as  needed for congestion (4 way nasal spray).    . sodium chloride (OCEAN) 0.65 % SOLN nasal spray Place 1 spray into both nostrils as needed for congestion.    Marland Kitchen venlafaxine XR (EFFEXOR-XR) 150 MG 24 hr capsule Take 1 capsule (150 mg total) daily with breakfast by mouth. 30 capsule 2   No current facility-administered medications for this visit.      Objective:   Physical Exam BP 128/62   Pulse (!) 44   Temp 98.3 F (36.8 C) (Oral)   Ht 5\' 8"  (1.727 m)   Wt 204 lb 9.6 oz (92.8 kg)   SpO2 97%   BMI 31.11 kg/m  Gen:  Alert, cooperative patient who appears stated age in no acute distress.  Vital signs reviewed. HEENT: EOMI,  MMM Cardiac:  Regular rate and rhythm initially.  However his pulse drops (by both my manual palpation and pulse oximeter reading) during deep inspirations to mid-40s.  Returns back to 70s when breathing returns to normal.   Pulm:  Wheezing BL bases but otherwise normal work of breathing.   Abd:  Soft/nondistended/nontender. Good bowel sounds.  Hernia unchanged, large, not painful.   Exts: Non edematous BL  LE, warm and well perfused.    45 minutes face time with patient today, plus 10 more minutes reviewing records and coming up with plan in light of complexity of care.

## 2017-04-09 DIAGNOSIS — R0989 Other specified symptoms and signs involving the circulatory and respiratory systems: Secondary | ICD-10-CM | POA: Insufficient documentation

## 2017-04-09 LAB — COMPREHENSIVE METABOLIC PANEL
ALBUMIN: 4.1 g/dL (ref 3.6–4.8)
ALK PHOS: 71 IU/L (ref 39–117)
ALT: 25 IU/L (ref 0–44)
AST: 20 IU/L (ref 0–40)
Albumin/Globulin Ratio: 1.5 (ref 1.2–2.2)
BILIRUBIN TOTAL: 0.3 mg/dL (ref 0.0–1.2)
BUN / CREAT RATIO: 15 (ref 10–24)
BUN: 13 mg/dL (ref 8–27)
CHLORIDE: 93 mmol/L — AB (ref 96–106)
CO2: 26 mmol/L (ref 20–29)
Calcium: 9.3 mg/dL (ref 8.6–10.2)
Creatinine, Ser: 0.88 mg/dL (ref 0.76–1.27)
GFR calc Af Amer: 104 mL/min/{1.73_m2} (ref >59)
GFR calc non Af Amer: 90 mL/min/{1.73_m2} (ref >59)
GLOBULIN, TOTAL: 2.7 g/dL (ref 1.5–4.5)
GLUCOSE: 120 mg/dL — AB (ref 65–99)
Potassium: 5.4 mmol/L — ABNORMAL HIGH (ref 3.5–5.2)
Sodium: 132 mmol/L — ABNORMAL LOW (ref 134–144)
Total Protein: 6.8 g/dL (ref 6.0–8.5)

## 2017-04-09 LAB — CBC
HEMATOCRIT: 43.2 % (ref 37.5–51.0)
Hemoglobin: 15.3 g/dL (ref 13.0–17.7)
MCH: 29.5 pg (ref 26.6–33.0)
MCHC: 35.4 g/dL (ref 31.5–35.7)
MCV: 83 fL (ref 79–97)
Platelets: 283 10*3/uL (ref 150–379)
RBC: 5.19 x10E6/uL (ref 4.14–5.80)
RDW: 14.5 % (ref 12.3–15.4)
WBC: 13.6 10*3/uL — AB (ref 3.4–10.8)

## 2017-04-09 LAB — TSH: TSH: 2.67 u[IU]/mL (ref 0.450–4.500)

## 2017-04-09 LAB — LIPID PANEL
CHOLESTEROL TOTAL: 207 mg/dL — AB (ref 100–199)
Chol/HDL Ratio: 5.2 ratio — ABNORMAL HIGH (ref 0.0–5.0)
HDL: 40 mg/dL (ref >39)
LDL Calculated: 148 mg/dL — ABNORMAL HIGH (ref 0–99)
TRIGLYCERIDES: 96 mg/dL (ref 0–149)
VLDL CHOLESTEROL CAL: 19 mg/dL (ref 5–40)

## 2017-04-09 NOTE — Assessment & Plan Note (Signed)
Overall doing well from this standpoint.  No further sx's of PNA.  Is fairly deconditioned from hospital stay, but politely declines any home PT.  Wife is present and pushes for this.  He agrees that at next visit if still with weakness he will think about PT.

## 2017-04-09 NOTE — Assessment & Plan Note (Signed)
Found incidentally today on exam.  Evidently present at home as well based on their pulse ox.  Likely secondary to bad COPD/lung disease.

## 2017-04-09 NOTE — Assessment & Plan Note (Signed)
On QID dialtiazem, which is hard for him to take.  With pulsus paradoxus, I wonder if he would benefit more from long-acting beta blocker versus as needed anti-arrhythmic to prevent hypotension.  Referral to cardiology today.  - Last Echo 2017.  Will update pending cardiology rec's

## 2017-04-09 NOTE — Assessment & Plan Note (Signed)
Checking labs today.  EKG revealed RBBB.  Has had LAFB in past and known RBBB.  Some PVCs as well.   - Referring to cardiology due to DOE symptoms and RBBB.  If does have RBBB plus LAFB, would be concern for total block.  Regardless, it's been several years since he's seen a cardiologist and it would be good to get their recommendations.

## 2017-04-10 ENCOUNTER — Other Ambulatory Visit: Payer: Self-pay | Admitting: Family Medicine

## 2017-04-10 MED ORDER — ATORVASTATIN CALCIUM 40 MG PO TABS: 40.0000 mg | ORAL_TABLET | Freq: Every day | ORAL | 3 refills | Status: DC

## 2017-04-10 NOTE — Progress Notes (Signed)
Starting patient on high intensity statin.  Due to smoking, his 10-year risk is 23.2%.

## 2017-04-13 ENCOUNTER — Other Ambulatory Visit: Payer: Self-pay | Admitting: Family Medicine

## 2017-04-15 NOTE — Discharge Summary (Signed)
Family Medicine Teaching [facility] Discharge Summary  Patient name: Juan [name] Medical record number: [id number] Date of birth: [birth year] Age: 67 y.o. Gender: male Date of Admission: 03/25/2017  Date of Discharge: 03/30/2017 Admitting Physician: Carney Living, MD  Primary Care Provider: Tobey Grim, MD Consultants: None  Indication for Hospitalization:  Acute on Chronic Hypoxic Respiratory Failure  Discharge Diagnoses/Problem List:  Acute on Chronic Hypoxic Respiratory Failure CAP COPD Hyperglycemia Paroxysmal Atrial Fibrillation HTN HFpEF Depression Anxiety Alcohol Use Disorder Tobacco Use Disorder  Disposition: home  Discharge Condition: medically stable  Discharge Exam:   Gen: Alert and Oriented x 3, NAD HEENT: Normocephalic, atraumatic, PERRLA, EOMI CV: RRR, no murmurs, normal S1, S2 split Resp: diffuse wheezing and decreased breath sounds throughout lung fields bilaterally. More in bases. NWOB. Abd: non-distended, non-tender, soft, +bs in all four quadrants, ventral wall hernia wrapped. MSK: moves all four extremities Ext: no clubbing, cyanosis, or edema Skin: warm, dry, intact, no rashes  Brief Hospital Course:  Juan [name] is a 67y/o male with a PMH of COPD/bronchiectasis, HTN (not on medication), RBBB, CHF, A. fib (not on medication) hyponatremia, depression, anxiety, tobacco use disorder, alcohol use disorder who presented with dyspnea. He had no chest pain but his O2 sats were in the 80s on admission. Due to a CXR showing a mild right upper lung infiltrate consistent with PNA he was started on IV antibiotics and also given a course of steroids for 5 days due to his COPD history. He required BiPap overnight and in the morning (2/20) his respiratory status improved and he was placed on nasal cannula but required oxygen. His respiratory status slowly improved with scheduled albuterol, duonebs, and prednisone. He was transitioned to  oral antibiotics (Cefdinir) and tolerated them well completing a total of 5 days for treatment. He tested for positive for Rhinovirus and Enterovirus and this is thought to be the initial cause of his respiratory compromise. He was able to be weaned to room air on discharge and was able to ambulate with a O2 sat at 94%.  Of note, multiple nights during his hospital stay he required Ativan for panic attacks where patient described the sensation of "not being able to breath" although his O2 sats remained above 92% on [place]. His anxiety is significant and is not well controlled. We continued his home medications but he would most likely benefit from change in his anti-anxiety mediation regimen to better control his panic attacks. We discussed the potential dangers of using Ativan long term for managing anxiety.  Issues for Follow Up:  1. Juan [name] has significant anxiety that plays a significant role in his ability to seek and get medical care. 2. Juan [name] has a complicated COPD medication regimen. His Rosalyn Gess was stopped as well as his Duonebs and he was discharged on pulmicort and albuterol and olodaterol 3. Juan [name] would benefit from quitting smoking. Please continue to encourage him and offer help to quit smoking as his motivation to quit may change over time.  Significant Procedures:   None  Significant Labs and Imaging:  Recent Labs  Lab 03/27/17 0956 03/28/17 0345 03/29/17 0237  WBC 12.2* 12.5* 9.5  HGB 14.8 15.3 14.7  HCT 43.4 44.9 43.9  PLT 234 234 234     Recent Labs  Lab 03/25/17 0858  03/27/17 0239 03/28/17 0345 03/29/17 0237  NA 128*   < > 134* 136 134*  K 4.5   < > 4.6 3.8 3.7  CL 92*   < >  100* 100* 98*  CO2 20*   < > 24 24 26   BUN 11   < > 24* 20 18  CREATININE 0.90   < > 0.90 0.83 0.78  CALCIUM 8.9   < > 8.5* 8.6* 8.4*  PROT 8.3*  --   --   --   --   BILITOT 0.6  --   --   --   --   ALKPHOS 72  --   --   --   --   ALT 22  --   --   --   --   AST  27  --   --   --   --   GLUCOSE 237*   < > 132* 105* 104*   Influenza: negative RVP: positive for Rhino/Enterovirus Strep: pending Legionella: pending HgbA1c: 6.4 Ethanol <10  Results/Tests Pending at Time of Discharge:   None  Discharge Medications:  Allergies as of 03/30/2017      Reactions   Coreg [carvedilol] Shortness Of Breath   Aspartame And Phenylalanine Nausea And Vomiting      Medication List    STOP taking these medications   albuterol-ipratropium 18-103 MCG/ACT inhaler Commonly known as:  COMBIVENT   arformoterol 15 MCG/2ML Nebu Commonly known as:  BROVANA     TAKE these medications   4-WAY FAST ACTING 1 % nasal spray Generic drug:  phenylephrine Place 1 drop into both nostrils every 6 (six) hours as needed for congestion (4 way nasal spray).   acetaminophen 325 MG tablet Commonly known as:  TYLENOL Take 2 tablets (650 mg total) by mouth every 6 (six) hours as needed for moderate pain.   albuterol (2.5 MG/3ML) 0.083% nebulizer solution Commonly known as:  PROVENTIL INHALE 1 VIAL VIA NEBULIZER EVERY 6 HOURS AS NEEDED FOR WHEEZING OR SHORTNESS OF BREATH   aspirin 81 MG chewable tablet Chew 1 tablet (81 mg total) by mouth daily.   budesonide 0.5 MG/2ML nebulizer solution Commonly known as:  PULMICORT Take 2 mLs (0.5 mg total) by nebulization 2 (two) times daily.   clonazePAM 0.5 MG tablet Commonly known as:  KLONOPIN TAKE 1 TABLET BY MOUTH 2 TIMES DAILY AS NEEDED FOR ANXIETY. What changed:    how much to take  how to take this  when to take this  additional instructions   diltiazem 30 MG tablet Commonly known as:  CARDIZEM Take 1 tablet (30 mg total) by mouth every 6 (six) hours.   hydroxypropyl methylcellulose / hypromellose 2.5 % ophthalmic solution Commonly known as:  ISOPTO TEARS / GONIOVISC Place 1 drop into both eyes 3 (three) times daily as needed for dry eyes.   multivitamin with minerals Tabs tablet Take 1 tablet by mouth daily  as needed (for vitamin).   Olodaterol HCl 2.5 MCG/ACT Aers Inhale 2 puffs daily into the lungs.   PRIMATENE ASTHMA 12.5-200 MG Tabs Generic drug:  ePHEDrine-GuaiFENesin Take 2 tablets by mouth as needed (for breathing == primatene).   PRIMATENE MIST IN Inhale 1 puff into the lungs as needed (for breathing).   sodium chloride 0.65 % Soln nasal spray Commonly known as:  OCEAN Place 1 spray into both nostrils as needed for congestion.   VICKS NYQUIL COUGH 6.25-15 MG/15ML Liqd Generic drug:  Doxylamine-DM Take 30 mLs by mouth at bedtime as needed (for sleep== nyquil).       Discharge Instructions: Please refer to Patient Instructions section of EMR for full details.  Patient was counseled important signs and symptoms  that should prompt return to medical care, changes in medications, dietary instructions, activity restrictions, and follow up appointments.   Follow-Up Appointments: Follow-up Information    Wendee Beavers, DO. Go on 04/03/2017.   Specialty:  Family Medicine Why:  Please make sure you arrive at 8:15am for your 8:30am appointment for hospital admission due to CAP and +Rhinovirus Contact information: [address] [place] [address] [phone]           Arlyce Harman, DO 04/15/2017, 11:07 PM PGY-1, [facility]

## 2017-04-16 ENCOUNTER — Telehealth: Payer: Self-pay | Admitting: Family Medicine

## 2017-04-16 DIAGNOSIS — R001 Bradycardia, unspecified: Secondary | ICD-10-CM

## 2017-04-16 NOTE — Telephone Encounter (Signed)
Pt called to check the status of his referral to see a cardiologist. Can we put that referral in? He wants to see Dr. [name] or is it already and I can not see it.jw

## 2017-04-17 NOTE — Telephone Encounter (Signed)
I have put this referral in.  I thought I did this last visit.  I can't see referrals anymore in Epic, so not sure if I did.

## 2017-04-18 ENCOUNTER — Other Ambulatory Visit: Payer: Medicare HMO

## 2017-04-18 DIAGNOSIS — E875 Hyperkalemia: Secondary | ICD-10-CM

## 2017-04-18 DIAGNOSIS — D72829 Elevated white blood cell count, unspecified: Secondary | ICD-10-CM | POA: Diagnosis not present

## 2017-04-19 LAB — CBC WITH DIFFERENTIAL/PLATELET
BASOS ABS: 0 10*3/uL (ref 0.0–0.2)
BASOS: 1 %
EOS (ABSOLUTE): 0.6 10*3/uL — AB (ref 0.0–0.4)
Eos: 8 %
HEMOGLOBIN: 15 g/dL (ref 13.0–17.7)
Hematocrit: 42.3 % (ref 37.5–51.0)
IMMATURE GRANS (ABS): 0.1 10*3/uL (ref 0.0–0.1)
Immature Granulocytes: 1 %
LYMPHS ABS: 1.8 10*3/uL (ref 0.7–3.1)
LYMPHS: 21 %
MCH: 30.2 pg (ref 26.6–33.0)
MCHC: 35.5 g/dL (ref 31.5–35.7)
MCV: 85 fL (ref 79–97)
MONOCYTES: 10 %
Monocytes Absolute: 0.8 10*3/uL (ref 0.1–0.9)
Neutrophils Absolute: 5.1 10*3/uL (ref 1.4–7.0)
Neutrophils: 59 %
Platelets: 248 10*3/uL (ref 150–379)
RBC: 4.97 x10E6/uL (ref 4.14–5.80)
RDW: 14.6 % (ref 12.3–15.4)
WBC: 8.4 10*3/uL (ref 3.4–10.8)

## 2017-04-19 LAB — BASIC METABOLIC PANEL
BUN / CREAT RATIO: 12 (ref 10–24)
BUN: 11 mg/dL (ref 8–27)
CHLORIDE: 94 mmol/L — AB (ref 96–106)
CO2: 23 mmol/L (ref 20–29)
Calcium: 9 mg/dL (ref 8.6–10.2)
Creatinine, Ser: 0.92 mg/dL (ref 0.76–1.27)
GFR, EST AFRICAN AMERICAN: 101 mL/min/{1.73_m2} (ref >59)
GFR, EST NON AFRICAN AMERICAN: 87 mL/min/{1.73_m2} (ref >59)
Glucose: 103 mg/dL — ABNORMAL HIGH (ref 65–99)
POTASSIUM: 5 mmol/L (ref 3.5–5.2)
Sodium: 130 mmol/L — ABNORMAL LOW (ref 134–144)

## 2017-04-26 ENCOUNTER — Encounter: Payer: Self-pay | Admitting: Family Medicine

## 2017-05-01 ENCOUNTER — Encounter: Payer: Self-pay | Admitting: Family Medicine

## 2017-05-06 DIAGNOSIS — R69 Illness, unspecified: Secondary | ICD-10-CM | POA: Diagnosis not present

## 2017-05-07 ENCOUNTER — Ambulatory Visit: Payer: Self-pay | Admitting: Physician Assistant

## 2017-05-16 ENCOUNTER — Ambulatory Visit: Payer: Self-pay | Admitting: Family Medicine

## 2017-05-16 NOTE — Telephone Encounter (Signed)
Attempted to call patient's wife at her request through Mychart.  Left message to return call to clinic.

## 2017-05-20 ENCOUNTER — Telehealth: Payer: Self-pay

## 2017-05-20 NOTE — Telephone Encounter (Signed)
Patient wife, [name], left message returning your call. She would like to speak with you about her questions and concerns re: husband if you think it is appropriate to speak to her.  Call back is [phone].  Ples Specter, RN [facility] RN)

## 2017-05-22 NOTE — Telephone Encounter (Signed)
Called and spoke with patient's wife.  She had some very specific concerns about some of his medical issues (pulmonary nodules, bronchiectasis, etc), not keeping appts with surgery, depression, alcohol use, his hernia, not keeping the cardiology appt, etc.  I told her that I can't discuss specific medical issues without his overt permission, but did discuss that he often cancels appointments and that he needs to be seen more regularly.  He will be back early May.    Will discuss hernia surgery, his depression at that visit.

## 2017-06-02 ENCOUNTER — Other Ambulatory Visit: Payer: Self-pay | Admitting: Family Medicine

## 2017-06-02 DIAGNOSIS — F411 Generalized anxiety disorder: Secondary | ICD-10-CM

## 2017-06-10 ENCOUNTER — Ambulatory Visit: Payer: Self-pay | Admitting: Family Medicine

## 2017-06-19 DIAGNOSIS — R69 Illness, unspecified: Secondary | ICD-10-CM | POA: Diagnosis not present

## 2017-06-24 ENCOUNTER — Encounter: Payer: Self-pay | Admitting: Family Medicine

## 2017-06-24 ENCOUNTER — Ambulatory Visit (INDEPENDENT_AMBULATORY_CARE_PROVIDER_SITE_OTHER): Payer: Medicare HMO | Admitting: Family Medicine

## 2017-06-24 ENCOUNTER — Other Ambulatory Visit: Payer: Self-pay

## 2017-06-24 VITALS — BP 158/92 | HR 93 | Temp 98.4°F | Ht 68.0 in | Wt 210.0 lb

## 2017-06-24 DIAGNOSIS — H259 Unspecified age-related cataract: Secondary | ICD-10-CM | POA: Diagnosis not present

## 2017-06-24 DIAGNOSIS — F419 Anxiety disorder, unspecified: Secondary | ICD-10-CM | POA: Diagnosis not present

## 2017-06-24 DIAGNOSIS — J479 Bronchiectasis, uncomplicated: Secondary | ICD-10-CM | POA: Diagnosis not present

## 2017-06-24 DIAGNOSIS — R911 Solitary pulmonary nodule: Secondary | ICD-10-CM | POA: Diagnosis not present

## 2017-06-24 DIAGNOSIS — I4891 Unspecified atrial fibrillation: Secondary | ICD-10-CM | POA: Diagnosis not present

## 2017-06-24 DIAGNOSIS — R69 Illness, unspecified: Secondary | ICD-10-CM | POA: Diagnosis not present

## 2017-06-24 DIAGNOSIS — R918 Other nonspecific abnormal finding of lung field: Secondary | ICD-10-CM

## 2017-06-24 DIAGNOSIS — Z23 Encounter for immunization: Secondary | ICD-10-CM | POA: Diagnosis not present

## 2017-06-24 DIAGNOSIS — Z0184 Encounter for antibody response examination: Secondary | ICD-10-CM | POA: Diagnosis not present

## 2017-06-24 NOTE — Patient Instructions (Signed)
It was good to see you again today.  Pneumonia vaccine today.  We also checking some blood.  I think it is a good idea to get you set up for a repeat CT scan of your chest.  I think going to see a pulmonologist will be a good idea to help me with your lung medicines.  Let see the results of your CT scan and we can talk more about this.  Try taking the Klonopin 2 pills in the morning to help with some of your anxiety.  Let me know how this is helping.  You will need a new prescription if we continue at 1 mg.  I'm very sorry to hear about your cat.   I will refer you to Dr. Crista Curb the eye doctor.  Come back and see me in about 6 weeks to make sure you are doing okay.

## 2017-06-24 NOTE — Progress Notes (Signed)
Subjective:    Juan [name] is a 67 y.o. male who presents to [facility] today for FU from pulsus paradoxus last visit:  1.  Pulsus paradoxus:  Did not keep appontment with cardiology.  States that he noted 2 days worth of pulse changing during deep inspiration on his home pulse oximeter after last visit and then it promptly resolved.  He has not noted any change in his pulse otherwise since last visit.  He has had no chest pain.  No palpitations.  His breathing has been about steady which is actually not very good.  Worse somewhat with allergies that have worsened.    He wants to know if he needs a repeat chest x-ray for pulmonary nodules which were found on 2017 imaging.  He has had no weight loss or night sweats.  Depression: He has been on in the vaccine 150 fairly regularly as well as Klonopin 0.5 mg once a morning once a evening.  However he does continue to have difficulty with depression.  His cat has recently gotten sick likely will not live for the next 1 to 2 weeks.  He states this is very difficult for him.  No SI/HI.    Prev health:  Currently overdue for Pneumovax.  He is also asking if he needs an MMR update.  .  ROS as above per HPI.   The following portions of the patient's history were reviewed and updated as appropriate: allergies, current medications, past medical history, family and social history, and problem list. Patient is a nonsmoker.    PMH reviewed.  Past Medical History:  Diagnosis Date  . Alcoholism ([place])   . Allergy    seasonal   . Anxiety   . Asthma   . CHF (congestive heart failure) ([place]) 10/2010   ECHO:  EF 40%, Grade II diastolic dysfunction  . COPD (chronic obstructive pulmonary disease) ([place])   . Depression   . Headache   . Hyperlipidemia   . Hypertension   . Seizures ([place])    Past Surgical History:  Procedure Laterality Date  . TOENAIL EXCISION Right 1967   Removal of ingrown toenail [Other]    Medications reviewed. Current Outpatient  Medications  Medication Sig Dispense Refill  . acetaminophen (TYLENOL) 325 MG tablet Take 2 tablets (650 mg total) by mouth every 6 (six) hours as needed for moderate pain. 30 tablet 0  . albuterol (PROVENTIL) (2.5 MG/3ML) 0.083% nebulizer solution INHALE 1 VIAL VIA NEBULIZER EVERY 6 HOURS AS NEEDED FOR WHEEZING OR SHORTNESS OF BREATH 375 mL 1  . aspirin 81 MG chewable tablet Chew 1 tablet (81 mg total) by mouth daily. (Patient not taking: Reported on 03/25/2017) 30 tablet 0  . atorvastatin (LIPITOR) 40 MG tablet Take 1 tablet (40 mg total) by mouth daily. 90 tablet 3  . budesonide (PULMICORT) 0.5 MG/2ML nebulizer solution Take 2 mLs (0.5 mg total) by nebulization 2 (two) times daily. 2 mL 3  . clonazePAM (KLONOPIN) 0.5 MG tablet Take 1 tablet (0.5 mg total) by mouth 2 (two) times daily as needed. 60 tablet 0  . diltiazem (CARDIZEM) 30 MG tablet Take 1 tablet (30 mg total) by mouth every 6 (six) hours. 30 tablet 0  . Doxylamine-DM (VICKS NYQUIL COUGH) 6.25-15 MG/15ML LIQD Take 30 mLs by mouth at bedtime as needed (for sleep== nyquil).    Marland Kitchen ePHEDrine-GuaiFENesin (PRIMATENE ASTHMA) 12.5-200 MG TABS Take 2 tablets by mouth as needed (for breathing == primatene).    Marland Kitchen EPINEPHrine Base (PRIMATENE  MIST IN) Inhale 1 puff into the lungs as needed (for breathing).    . hydroxypropyl methylcellulose / hypromellose (ISOPTO TEARS / GONIOVISC) 2.5 % ophthalmic solution Place 1 drop into both eyes 3 (three) times daily as needed for dry eyes. 15 mL 0  . ipratropium (ATROVENT) 0.02 % nebulizer solution Take 2.5 mLs (0.5 mg total) by nebulization 4 (four) times daily. Please dispense QS x 1 month 62.5 mL 3  . Ipratropium-Albuterol (COMBIVENT RESPIMAT) 20-100 MCG/ACT AERS respimat INHALE 1-2 PUFFS INTO THE LUNGS 6 (SIX) TIMES DAILY AS NEEDED FOR WHEEZING.  Please dispense 2 inhalers per month 4 g 6  . Multiple Vitamin (MULTIVITAMIN WITH MINERALS) TABS tablet Take 1 tablet by mouth daily as needed (for vitamin).     .  Olodaterol HCl 2.5 MCG/ACT AERS Inhale 2 puffs daily into the lungs. 4 g 2  . phenylephrine (4-WAY FAST ACTING) 1 % nasal spray Place 1 drop into both nostrils every 6 (six) hours as needed for congestion (4 way nasal spray).    . sodium chloride (OCEAN) 0.65 % SOLN nasal spray Place 1 spray into both nostrils as needed for congestion.    Marland Kitchen venlafaxine XR (EFFEXOR-XR) 150 MG 24 hr capsule TAKE 1 CAPSULE ONCE DAILY WITH BREAKFAST. 90 capsule 1   No current facility-administered medications for this visit.      Objective:   Physical Exam BP (!) 158/92   Pulse 93   Temp 98.4 F (36.9 C) (Oral)   Ht _0  (1.727 m)   Wt 210 lb (95.3 kg)   SpO2 96%   BMI 31.93 kg/m  Gen:  Alert, cooperative patient who appears stated age in no acute distress.  Vital signs reviewed. HEENT: EOMI,  MMM Cardiac:  Regular rate and rhythm  Pulm:  Kyphosis noted.  Wheezing in most lung fields.  Good air movement.   Abd:  Soft/nondistended/nontender. Hernia noted, not change, nontender.  Exts: Non edematous BL  LE, warm and well perfused.   No results found for this or any previous visit (from the past 72 hour(s)).

## 2017-06-25 ENCOUNTER — Encounter: Payer: Self-pay | Admitting: Family Medicine

## 2017-06-25 LAB — MEASLES/MUMPS/RUBELLA IMMUNITY
MUMPS ABS, IGG: 147 AU/mL (ref >10.9)
RUBEOLA AB, IGG: >300 AU/mL (ref >29.9)
Rubella Antibodies, IGG: 4.35 index (ref >0.99)

## 2017-06-25 LAB — COMPREHENSIVE METABOLIC PANEL
A/G RATIO: 1.8 (ref 1.2–2.2)
ALT: 30 IU/L (ref 0–44)
AST: 25 IU/L (ref 0–40)
Albumin: 4.6 g/dL (ref 3.6–4.8)
Alkaline Phosphatase: 73 IU/L (ref 39–117)
BUN / CREAT RATIO: 14 (ref 10–24)
BUN: 12 mg/dL (ref 8–27)
Bilirubin Total: 0.5 mg/dL (ref 0.0–1.2)
CALCIUM: 9.4 mg/dL (ref 8.6–10.2)
CO2: 21 mmol/L (ref 20–29)
Chloride: 92 mmol/L — ABNORMAL LOW (ref 96–106)
Creatinine, Ser: 0.84 mg/dL (ref 0.76–1.27)
GFR, EST AFRICAN AMERICAN: 106 mL/min/{1.73_m2} (ref >59)
GFR, EST NON AFRICAN AMERICAN: 92 mL/min/{1.73_m2} (ref >59)
GLOBULIN, TOTAL: 2.6 g/dL (ref 1.5–4.5)
Glucose: 105 mg/dL — ABNORMAL HIGH (ref 65–99)
POTASSIUM: 5.2 mmol/L (ref 3.5–5.2)
SODIUM: 129 mmol/L — AB (ref 134–144)
TOTAL PROTEIN: 7.2 g/dL (ref 6.0–8.5)

## 2017-06-25 LAB — CBC
Hematocrit: 43.9 % (ref 37.5–51.0)
Hemoglobin: 15.9 g/dL (ref 13.0–17.7)
MCH: 31.1 pg (ref 26.6–33.0)
MCHC: 36.2 g/dL — AB (ref 31.5–35.7)
MCV: 86 fL (ref 79–97)
PLATELETS: 247 10*3/uL (ref 150–450)
RBC: 5.12 x10E6/uL (ref 4.14–5.80)
RDW: 14.4 % (ref 12.3–15.4)
WBC: 9.9 10*3/uL (ref 3.4–10.8)

## 2017-06-26 ENCOUNTER — Encounter: Payer: Self-pay | Admitting: *Deleted

## 2017-06-26 ENCOUNTER — Encounter: Payer: Self-pay | Admitting: Family Medicine

## 2017-06-26 ENCOUNTER — Telehealth: Payer: Self-pay | Admitting: *Deleted

## 2017-06-26 DIAGNOSIS — H269 Unspecified cataract: Secondary | ICD-10-CM | POA: Insufficient documentation

## 2017-06-26 NOTE — Addendum Note (Signed)
Addended by: Lamonte Sakai, Natoya Viscomi D on: 06/26/2017 10:42 AM   Modules accepted: Orders

## 2017-06-26 NOTE — Assessment & Plan Note (Signed)
Still on QID diltiazem.  Did not keep appointment with cardiology.  For short term, believe pulmonology appointment will be more beneficial but will need to see cardiologist in near future.

## 2017-06-26 NOTE — Assessment & Plan Note (Signed)
CT scan of chest to assess.

## 2017-06-26 NOTE — Assessment & Plan Note (Signed)
He has had some changes to long-acting inhalers at most recent hospitalization.  With pulsus paradoxus at last visit most likely related to lung disease.  Referring to pulmonology today for further evaluation and input.

## 2017-06-26 NOTE — Telephone Encounter (Signed)
Sending message via my chart per pt wife. Lamonte Sakai, [name], [place]

## 2017-06-26 NOTE — Assessment & Plan Note (Signed)
Worsened for situational reasons with cat.  Wife has also expressed concern over his anxiety. - Continue Venlafaxine.  Increase AM Klonopin to 1 mg.  Has been on 0.5 mg for as long as I've known him.  - I think psychology/therapist would be helpful for him.  Declined today.  Will continue to encourage.   - Follow up next visit.

## 2017-06-26 NOTE — Assessment & Plan Note (Signed)
Reports that he has also been told he has macular degeneration in past.  Will refer to ophthalmology today.  Requests Dr. Luciana Axe

## 2017-07-02 ENCOUNTER — Other Ambulatory Visit: Payer: Self-pay | Admitting: *Deleted

## 2017-07-02 ENCOUNTER — Ambulatory Visit: Payer: Self-pay | Admitting: *Deleted

## 2017-07-02 ENCOUNTER — Other Ambulatory Visit: Payer: Self-pay

## 2017-07-02 NOTE — Patient Outreach (Signed)
[facility]) Care Management  07/02/2017  Juan [name] October 27, 1950 [id number]  Telephone screening call  Referral received 07/01/17 Referral source: [facility] Referral reason : [facility] pharmacy for review of medication and see if any cost saving measures.  Insurance: Paramedic   Unsuccessful outreach call to patient, able to leave a HIPPA compliant message for return call  Plan If no response from call , will send unsuccessful outreach letter and plan 2nd call attempt in next 4 business days.    Egbert Garibaldi, RN, [facility] Care Management,Care Management Coordinator  [phone]- Mobile [phone]- Toll Free Main Office

## 2017-07-03 ENCOUNTER — Other Ambulatory Visit: Payer: Self-pay | Admitting: Family Medicine

## 2017-07-03 DIAGNOSIS — F411 Generalized anxiety disorder: Secondary | ICD-10-CM

## 2017-07-05 ENCOUNTER — Other Ambulatory Visit: Payer: Self-pay | Admitting: Family Medicine

## 2017-07-05 ENCOUNTER — Telehealth: Payer: Self-pay | Admitting: Family Medicine

## 2017-07-05 DIAGNOSIS — R69 Illness, unspecified: Secondary | ICD-10-CM | POA: Diagnosis not present

## 2017-07-05 MED ORDER — ALBUTEROL SULFATE (2.5 MG/3ML) 0.083% IN NEBU: INHALATION_SOLUTION | RESPIRATORY_TRACT | 1 refills | Status: DC

## 2017-07-05 NOTE — Telephone Encounter (Signed)
**  After Hours/ Emergency Line Call*  Received from patient's wife requesting refill for albuterol neb solution. Prescription sent to pharmacy.  Lovena Neighbours, MD [facility], PGY-2

## 2017-07-07 ENCOUNTER — Other Ambulatory Visit: Payer: Self-pay | Admitting: *Deleted

## 2017-07-07 ENCOUNTER — Encounter: Payer: Self-pay | Admitting: *Deleted

## 2017-07-07 NOTE — Patient Outreach (Signed)
[facility]) Care Management  07/07/2017  Juan [name] 09/01/1950 [id number]  Telephone screening call #2  Referral received 07/01/17 Referral source: [facility] Referral reason : [facility] pharmacy for review of medication and see if any cost saving measures.  Insurance: Paramedic   Unsuccessful outreach call to patient,  Placed call to home preferred number and mobile numbers, able to leave a HIPPA compliant message for return call  Plan  Will await return call if no response will  plan return call on day #7, 6/6   Egbert Garibaldi, RN, [facility] Care Management,Care Management Coordinator  [phone]- Mobile [phone]- Toll Free Main Office

## 2017-07-08 ENCOUNTER — Other Ambulatory Visit: Payer: Self-pay | Admitting: *Deleted

## 2017-07-08 NOTE — Patient Outreach (Signed)
[facility]) Care Management  07/08/2017  Juan [name] [birth year] [id number]   Telephone screening call #2  Referral received 07/01/17 Referral source: [facility] Referral reason : [facility] pharmacy for review of medication and see if any cost saving measures.  Insurance: [employer]   Incoming returned  call from patient wife, HIPAA information verified, received verbal agreement from patient to talk with his wife.Explained purpose of the call.   Social  Patient lives at home with his wife , he is independent with ADL's after wife gathers supplies for him. Patient has a walker/cane if needed at home wife reports he does not use, and denies patient having recent falls, he does have some vision limitations.  Wife provides transportation to medical appointments.   Medical conditions  Wife discussed patient, history of COPD, she reports patient without increased symptoms of shortness of breath, cough or sputum changes. Wife discussed patient with hospital admission in February with pneumonia, patient has nebulizer machine for use at home as well as oxygen from previous admission in Feb. But he does not use but likes to keep in case needed.  Wife states it is difficult at times to get patient to go to the doctor. Wife discussed patient has a large umbilical hernia that doctors are following.   Medications Wife reports cost concerns regarding patient medications especially inhalers  concern about going into the donut hole, Wife discussed she is using [facility] for discounts on some medications.  Wife reports she currently has all of patient medication except for Klonopin that she has requested from pharmacy, that states they will contact PCP office for refill, states she has not called pharmacy to follow on today yet but usually gets a message when refills available.  Discussed with wife contacting PCP regarding getting refill on medication, offered  assistance with this but states she will be able to contact office, Wife reports since Friday she has been using valium that patient had previously   for patient since she has not had klonopin   Consent  Explained and offered [facility] care management services, wife is agreeable to [facility] pharmacy referral to address concerns regarding medication cost. Discussed [facility] community and telephonic programs for COPD education and support declines at this time, stating patient would not be agreeable to home/telephone visits at this time she is agreeable to starting with pharmacy referral.   Provided [facility] main office contact number if she decides later. Provided with 24 nurse line number.    Plan  Will place [facility] pharmacy referral for medication review, and cost saving concerns. Case marked as active.  Will send successful outreach letter.   Egbert Garibaldi, RN, [facility] Care Management,Care Management Coordinator  [phone]- Mobile [phone]- Toll Free Main Office

## 2017-07-10 ENCOUNTER — Ambulatory Visit: Payer: Self-pay | Admitting: *Deleted

## 2017-07-15 DIAGNOSIS — R69 Illness, unspecified: Secondary | ICD-10-CM | POA: Diagnosis not present

## 2017-07-18 ENCOUNTER — Other Ambulatory Visit: Payer: Self-pay | Admitting: Pharmacist

## 2017-07-18 NOTE — Patient Outreach (Signed)
[facility]) Care Management  07/18/2017  BETH BEDSWORTH 1950/03/08 [id number]  67 year old male referred to [facility] Care Management by PCP Dr. Gwendolyn Grant for medication assistance.  PMHx includes, but not limited to, depression, anxiety, alcohol and tobacco abuse, tremors, seizures,  HTN, HLD, asthma, COPD, hx pulmonary nodules, HFpEF, RBBB, and atrial fibrillation (not on anticoagulation).  Patient had hospitalization in February 2019 for PNA.   Per notes, wife requests that [facility] contact her directly regarding medication assistance.  She has questions about using medications from [place].    Successful call placed to Mr. Davante Beitler spouse, [name].  HIPAA identifiers verified.  She states "I am looking for financial information." Spouse states patient is not in the coverage gap yet but is concerned about price of his inhalers once the coverage gap starts.  She has been looking at prices from a [employer] which are much less expensive than [place] pharmacies.  She reports they are currently using [facility] for coupons and [facility].  Per spouse, this is patient's first year with Medicare.   She believes he has [employer] PDP and a supplement.   She is not interested in reviewing medications at this time.  She reports that spouse may have CT scan and appt with pulmonary with July so is not interested in finding PAP programs with inhaler substitutions as needed.   Medication assistance: Extra Help:  Patient does not qualify based on reported income.   [employer]: PAP for Combivent and Striverdi Respimat.  Patient may qualify.  No TROOP requirement.  I reviewed application program with patient's spouse who voiced understanding.  She would like to discuss with her spouse and look on website before making decision to apply.    SHIIP: I reviewed SHIIP [employer] and Information Program) with spouse and provided phone number.  I recommended that patient speak with  SHIIP to ensure he is on the best plan for 2020.    Patient had several questions about [place] pharmacies.  I am unable to give any endorsement for using [place] pharmacies for medications as I have no experience with these companies and cannot speak to the regulation and manufacturing of medications.    Plan: I will follow-up with patient in 1 week about decision to apply for inhaler PAPs unless I hear back from spouse beforehand.    Haynes Hoehn, PharmD, [facility] Clinical Pharmacist [facility] [employer] [phone]

## 2017-07-25 ENCOUNTER — Ambulatory Visit: Payer: Self-pay | Admitting: Pharmacist

## 2017-07-25 ENCOUNTER — Telehealth: Payer: Self-pay | Admitting: Pharmacist

## 2017-07-25 NOTE — Patient Outreach (Signed)
[facility]) Care Management  07/25/2017  Juan [name] 03/03/1950 [id number]  Unsuccessful phone call attempts to Mr. Loup and spouse.  I left a HIPAA compliant voicemail on the home and mobile phone numbers.    Plan: I will try calling patient and spouse again in 1-2 weeks.   Haynes Hoehn, PharmD, [facility] Clinical Pharmacist [facility] [employer] [phone]

## 2017-08-01 ENCOUNTER — Other Ambulatory Visit: Payer: Self-pay | Admitting: Family Medicine

## 2017-08-01 DIAGNOSIS — F411 Generalized anxiety disorder: Secondary | ICD-10-CM

## 2017-08-04 ENCOUNTER — Other Ambulatory Visit: Payer: Self-pay | Admitting: Pharmacist

## 2017-08-04 ENCOUNTER — Ambulatory Visit: Payer: Self-pay | Admitting: Pharmacist

## 2017-08-04 NOTE — Patient Outreach (Signed)
[facility]) Care Management  08/04/2017  JAIRED KENNEMORE 1950/06/18 [id number]   Unsuccessful outreach attempt #2 to Mr. Garbett spouse regarding medication assistance.  I left a HIPAA compliant voicemail on the home and mobile phone numbers listed in [facility].  I will mail an outreach letter describing [facility] services to patient's house and follow-up later this week unless I hear back beforehand.   Haynes Hoehn, PharmD, [facility] Clinical Pharmacist [facility] [employer] [phone]

## 2017-08-08 ENCOUNTER — Other Ambulatory Visit: Payer: Self-pay | Admitting: Pharmacist

## 2017-08-08 ENCOUNTER — Telehealth: Payer: Self-pay | Admitting: *Deleted

## 2017-08-08 NOTE — Telephone Encounter (Signed)
Perhabs we can call the pharmacy to give him an early refill. Please me know what they say.

## 2017-08-08 NOTE — Telephone Encounter (Signed)
Pts wife called nurse line.  She is concerned because pt is almost out of his combivent and cant get a refill until 08/18/17.  Wants to know if there is something else they can use or if we have samples. Amaryllis Malmquist, Maryjo Rochester, CMA

## 2017-08-08 NOTE — Telephone Encounter (Signed)
Spoke with Teacher, music, Michele Mcalpine.  Pt already uses 2 inhalers a month which is very uncommon.  Usually insurance only pays for 1 a month.  Pt is 3 days early and insurance will not pay until then.  Pharmacy has informed of this and pt is not willing to pay out of pocket for med.   Pharmacist suggest maybe pt needs to be put on something else since he is using so much. Mecca Barga, Maryjo Rochester, CMA

## 2017-08-08 NOTE — Telephone Encounter (Signed)
I called and spoke with his wife. I might need reevaluation of his COPD/Asthma. I recommended [facility] evaluation since our clinic is closed now. Wife said she will take him if needed or call to make f/u with Gwendolyn Grant next week.

## 2017-08-08 NOTE — Telephone Encounter (Signed)
Attempted to call back. No answer and no machine.  Juan [name], Juan [name], CMA

## 2017-08-08 NOTE — Patient Outreach (Signed)
[facility]) Care Management  08/08/2017  DIEDRICH IACONO [birth year] [id number]   Unsuccessful call attempt #3 to Mr. Breaux spouse regarding medication assistance.   I will close [facility] pharmacy case at this time as I have been unable to maintain contact with patient.  I am happy to assist in the future as needed.   Haynes Hoehn, PharmD, [facility] Clinical Pharmacist [facility] [employer] [phone]

## 2017-08-08 NOTE — Telephone Encounter (Signed)
I called the pharmacy, he has two more refills. Please call him to contact his pharmacy. Thanks.

## 2017-08-11 ENCOUNTER — Other Ambulatory Visit: Payer: Self-pay

## 2017-08-11 ENCOUNTER — Encounter: Payer: Self-pay | Admitting: Family Medicine

## 2017-08-11 DIAGNOSIS — R911 Solitary pulmonary nodule: Secondary | ICD-10-CM

## 2017-09-07 DIAGNOSIS — R69 Illness, unspecified: Secondary | ICD-10-CM | POA: Diagnosis not present

## 2017-09-09 ENCOUNTER — Other Ambulatory Visit: Payer: Self-pay

## 2017-09-09 ENCOUNTER — Emergency Department (HOSPITAL_COMMUNITY): Payer: Medicare HMO

## 2017-09-09 ENCOUNTER — Emergency Department (HOSPITAL_COMMUNITY)
Admission: EM | Admit: 2017-09-09 | Discharge: 2017-09-09 | Disposition: A | Discharge status: Home / Self Care | Payer: Medicare HMO | Attending: Emergency Medicine | Admitting: Emergency Medicine

## 2017-09-09 DIAGNOSIS — J449 Chronic obstructive pulmonary disease, unspecified: Secondary | ICD-10-CM | POA: Insufficient documentation

## 2017-09-09 DIAGNOSIS — I5032 Chronic diastolic (congestive) heart failure: Secondary | ICD-10-CM | POA: Diagnosis not present

## 2017-09-09 DIAGNOSIS — Z7982 Long term (current) use of aspirin: Secondary | ICD-10-CM | POA: Insufficient documentation

## 2017-09-09 DIAGNOSIS — J984 Other disorders of lung: Secondary | ICD-10-CM | POA: Diagnosis not present

## 2017-09-09 DIAGNOSIS — R0902 Hypoxemia: Secondary | ICD-10-CM | POA: Diagnosis not present

## 2017-09-09 DIAGNOSIS — R69 Illness, unspecified: Secondary | ICD-10-CM | POA: Diagnosis not present

## 2017-09-09 DIAGNOSIS — R531 Weakness: Secondary | ICD-10-CM | POA: Diagnosis not present

## 2017-09-09 DIAGNOSIS — F10220 Alcohol dependence with intoxication, uncomplicated: Secondary | ICD-10-CM | POA: Insufficient documentation

## 2017-09-09 DIAGNOSIS — Z79899 Other long term (current) drug therapy: Secondary | ICD-10-CM | POA: Diagnosis not present

## 2017-09-09 DIAGNOSIS — R197 Diarrhea, unspecified: Secondary | ICD-10-CM | POA: Diagnosis not present

## 2017-09-09 DIAGNOSIS — R112 Nausea with vomiting, unspecified: Secondary | ICD-10-CM | POA: Diagnosis not present

## 2017-09-09 DIAGNOSIS — E46 Unspecified protein-calorie malnutrition: Secondary | ICD-10-CM | POA: Diagnosis not present

## 2017-09-09 DIAGNOSIS — F17210 Nicotine dependence, cigarettes, uncomplicated: Secondary | ICD-10-CM | POA: Insufficient documentation

## 2017-09-09 DIAGNOSIS — I110 Hypertensive heart disease with heart failure: Secondary | ICD-10-CM | POA: Insufficient documentation

## 2017-09-09 DIAGNOSIS — F10929 Alcohol use, unspecified with intoxication, unspecified: Secondary | ICD-10-CM | POA: Diagnosis present

## 2017-09-09 LAB — COMPREHENSIVE METABOLIC PANEL
ALT: 61 U/L — ABNORMAL HIGH (ref 0–44)
AST: 108 U/L — ABNORMAL HIGH (ref 15–41)
Albumin: 3.9 g/dL (ref 3.5–5.0)
Alkaline Phosphatase: 87 U/L (ref 38–126)
Anion gap: 17 — ABNORMAL HIGH (ref 5–15)
BUN: 21 mg/dL (ref 8–23)
CO2: 22 mmol/L (ref 22–32)
Calcium: 8 mg/dL — ABNORMAL LOW (ref 8.9–10.3)
Chloride: 95 mmol/L — ABNORMAL LOW (ref 98–111)
Creatinine, Ser: 0.98 mg/dL (ref 0.61–1.24)
GFR calc Af Amer: >60 mL/min (ref >60)
GFR calc non Af Amer: >60 mL/min (ref >60)
Glucose, Bld: 152 mg/dL — ABNORMAL HIGH (ref 70–99)
Potassium: 4.3 mmol/L (ref 3.5–5.1)
Sodium: 134 mmol/L — ABNORMAL LOW (ref 135–145)
Total Bilirubin: 0.9 mg/dL (ref 0.3–1.2)
Total Protein: 7.1 g/dL (ref 6.5–8.1)

## 2017-09-09 LAB — CBC
HCT: 47.6 % (ref 39.0–52.0)
Hemoglobin: 17.2 g/dL — ABNORMAL HIGH (ref 13.0–17.0)
MCH: 31.4 pg (ref 26.0–34.0)
MCHC: 36.1 g/dL — ABNORMAL HIGH (ref 30.0–36.0)
MCV: 87 fL (ref 78.0–100.0)
Platelets: 254 10*3/uL (ref 150–400)
RBC: 5.47 MIL/uL (ref 4.22–5.81)
RDW: 13.8 % (ref 11.5–15.5)
WBC: 9.7 10*3/uL (ref 4.0–10.5)

## 2017-09-09 LAB — LIPASE, BLOOD: Lipase: 26 U/L (ref 11–51)

## 2017-09-09 LAB — ETHANOL: Alcohol, Ethyl (B): 229 mg/dL — ABNORMAL HIGH (ref <10)

## 2017-09-09 LAB — CBG MONITORING, ED: Glucose-Capillary: 156 mg/dL — ABNORMAL HIGH (ref 70–99)

## 2017-09-09 MED ORDER — ONDANSETRON HCL 4 MG/2ML IJ SOLN
4.0000 mg | Freq: Once | INTRAMUSCULAR | Status: AC
  Administered 2017-09-09: 4 mg via INTRAVENOUS
  Filled 2017-09-09: qty 2

## 2017-09-09 MED ORDER — LORAZEPAM 2 MG/ML IJ SOLN
1.0000 mg | Freq: Once | INTRAMUSCULAR | Status: AC
  Administered 2017-09-09: 1 mg via INTRAVENOUS
  Filled 2017-09-09: qty 1

## 2017-09-09 MED ORDER — IOHEXOL 300 MG/ML  SOLN
75.0000 mL | Freq: Once | INTRAMUSCULAR | Status: AC | PRN
  Administered 2017-09-09: 75 mL via INTRAVENOUS

## 2017-09-09 MED ORDER — LOPERAMIDE HCL 2 MG PO CAPS
4.0000 mg | ORAL_CAPSULE | Freq: Once | ORAL | Status: DC
  Filled 2017-09-09: qty 2

## 2017-09-09 MED ORDER — LORAZEPAM 2 MG/ML IJ SOLN
2.0000 mg | Freq: Once | INTRAMUSCULAR | Status: AC
  Administered 2017-09-09: 2 mg via INTRAVENOUS
  Filled 2017-09-09: qty 1

## 2017-09-09 MED ORDER — SODIUM CHLORIDE 0.9 % IV BOLUS
1000.0000 mL | Freq: Once | INTRAVENOUS | Status: AC
  Administered 2017-09-09: 1000 mL via INTRAVENOUS

## 2017-09-09 NOTE — ED Notes (Signed)
RN Waldo Laine called a cab for patient, per patient request. Pt reports he has money to pay for a cab

## 2017-09-09 NOTE — ED Triage Notes (Signed)
Patient arrives with c/o N/V/D x1 week, stopped today, weakness. Decreased appetite, daily alcohol use. Last use this morning. Hx COPD, CHF   BP: 150/92 HR: 100 O2: 97 CBG: 146

## 2017-09-09 NOTE — Progress Notes (Signed)
CSW consulted for possible Home Health Services for patient. CSW spoke with patient and patient's wife at bedside. CSW asked by patient's wife to talk in private. Patient's wife expressed concerns for patient's drinking habits. Per wife, patient drinks about 2 50oz Bourbon bottles a week. Per wife, patient does not think he has a problem. Wife stated she has taken patient to [facility] in the past. CSW inquired about interest in [facility]. Wife stated patient does not like people to be in the home and does not know how receptive he will be. At this time, patient's wife is declining [employer] traffic [employer]. CSW consulted Peer Support Arlys John for assistance with substance use resources for patient.   Archie Balboa, LCSWA  Clinical Social Work Department  [employer]  [phone]

## 2017-09-09 NOTE — ED Notes (Signed)
Wife contact number [phone]  Wife stated she would not be home to take care of patient. Informed wife that patient has a right to be at home. Wife gave contact number in the event the MD wanted to contact her. MD made aware.

## 2017-09-09 NOTE — ED Provider Notes (Signed)
[facility]-EMERGENCY DEPT Provider Note   CSN: [id number] Arrival date & time: 09/09/17  0907     History   Chief Complaint Chief Complaint  Patient presents with  . Nausea  . Emesis  . Diarrhea    HPI Juan [name] is a 67 y.o. male.  HPI   67 year old male with nausea/vomiting diarrhea.  Onset about a week ago.  Is actually improved in the past 24 hours but he feels generally very weak.  Poor appetite.  No fevers or chills.  No urinary complaints.  He has a history of alcohol abuse.  He reports that he drinks "4 cups of bourbon" a day.  Reports poor sleep.  He was sleep for a few hours and then be up again.  Past Medical History:  Diagnosis Date  . Alcoholism (HCC)   . Allergy    seasonal   . Anxiety   . Asthma   . CHF (congestive heart failure) (HCC) 10/2010   ECHO:  EF 40%, Grade II diastolic dysfunction  . COPD (chronic obstructive pulmonary disease) (HCC)   . Depression   . Headache   . Hyperlipidemia   . Hypertension   . Seizures [facility])     Patient Active Problem List   Diagnosis Date Noted  . Cataract 06/26/2017  . Pulsus paradoxus 04/09/2017  . COPD exacerbation (HCC)   . Alcoholism (HCC) 11/14/2014  . Atrial fibrillation with rapid ventricular response (HCC) 11/14/2014  . Chronic diastolic heart failure (HCC)   . Peripheral neuropathy 12/15/2013  . Bronchiectasis without acute exacerbation (HCC) 06/30/2013  . Lung nodule seen on imaging study 06/15/2013  . RBBB 11/09/2012  . Hyponatremia 11/08/2012  . Umbilical hernia 08/06/2012  . Smoker unmotivated to quit 07/22/2012  . Tremor 07/30/2011  . HTN (hypertension) 07/30/2011  . Hepatomegaly 11/05/2010  . Depression 11/05/2010  . Anxiety 11/05/2010    Past Surgical History:  Procedure Laterality Date  . TOENAIL EXCISION Right 1967   Removal of ingrown toenail [Other]        Home Medications    Prior to Admission medications   Medication Sig Start Date End Date  Taking? Authorizing Provider  acetaminophen (TYLENOL) 325 MG tablet Take 2 tablets (650 mg total) by mouth every 6 (six) hours as needed for moderate pain. 11/21/14  Yes Jeralyn Bennett, MD  albuterol (PROVENTIL) (2.5 MG/3ML) 0.083% nebulizer solution INHALE 1 VIAL VIA NEBULIZER EVERY 6 HOURS AS NEEDED FOR WHEEZING OR SHORTNESS OF BREATH 07/05/17  Yes Diallo, Abdoulaye, MD  aspirin 81 MG chewable tablet Chew 1 tablet (81 mg total) by mouth daily. 12/20/15  Yes Mayo, Allyn Kenner, MD  atorvastatin (LIPITOR) 40 MG tablet Take 1 tablet (40 mg total) by mouth daily. 04/10/17  Yes Tobey Grim, MD  budesonide (PULMICORT) 0.5 MG/2ML nebulizer solution Take 2 mLs (0.5 mg total) by nebulization 2 (two) times daily. 01/15/17  Yes Tobey Grim, MD  clonazePAM (KLONOPIN) 0.5 MG tablet TAKE 1 TABLET BY MOUTH TWICE A DAY AS NEEDED 07/08/17  Yes Tobey Grim, MD  ePHEDrine-GuaiFENesin (PRIMATENE ASTHMA) 12.5-200 MG TABS Take 2 tablets by mouth as needed (for breathing == primatene).   Yes [provider]  EPINEPHrine Base (PRIMATENE MIST IN) Inhale 1 puff into the lungs as needed (for breathing).   Yes [provider]  ipratropium (ATROVENT) 0.02 % nebulizer solution Take 2.5 mLs (0.5 mg total) by nebulization 4 (four) times daily. Please dispense QS x 1 month 04/08/17  Yes Gwendolyn Grant,  Newt Lukes, MD  Ipratropium-Albuterol (COMBIVENT RESPIMAT) 20-100 MCG/ACT AERS respimat INHALE 1-2 PUFFS INTO THE LUNGS 6 (SIX) TIMES DAILY AS NEEDED FOR WHEEZING.  Please dispense 2 inhalers per month 04/08/17  Yes Tobey Grim, MD  Multiple Vitamin (MULTIVITAMIN WITH MINERALS) TABS tablet Take 1 tablet by mouth daily as needed (for vitamin).    Yes [provider]  phenylephrine (4-WAY FAST ACTING) 1 % nasal spray Place 1 drop into both nostrils every 6 (six) hours as needed for congestion (4 way nasal spray).   Yes [provider]  sodium chloride (OCEAN) 0.65 % SOLN nasal spray Place 1 spray  into both nostrils as needed for congestion.   Yes [provider]  sodium chloride 1 g tablet Take 1 g by mouth daily.   Yes [provider]  venlafaxine XR (EFFEXOR-XR) 150 MG 24 hr capsule TAKE 1 CAPSULE ONCE DAILY WITH BREAKFAST. 04/14/17  Yes Tobey Grim, MD  diltiazem (CARDIZEM) 30 MG tablet Take 1 tablet (30 mg total) by mouth every 6 (six) hours. Patient not taking: Reported on 09/09/2017 03/30/17   Howard Pouch, MD  hydroxypropyl methylcellulose / hypromellose (ISOPTO TEARS / GONIOVISC) 2.5 % ophthalmic solution Place 1 drop into both eyes 3 (three) times daily as needed for dry eyes. Patient not taking: Reported on 09/09/2017 01/01/15   Araceli Bouche, DO  Olodaterol HCl 2.5 MCG/ACT AERS Inhale 2 puffs daily into the lungs. Patient not taking: Reported on 09/09/2017 12/13/16   Tobey Grim, MD  albuterol (PROVENTIL,VENTOLIN) 90 MCG/ACT inhaler Inhale 2 puffs into the lungs every 6 (six) hours as needed for wheezing. 01/03/11 04/22/11  Tobey Grim, MD    Family History Family History  Problem Relation Age of Onset  . Alzheimer's disease Mother   . Diabetes Mother   . Heart disease Maternal Uncle     Social History Social History   Tobacco Use  . Smoking status: Current Every Day Smoker    Packs/day: 1.50    Years: 40.00    Pack years: 60.00    Types: Cigarettes  . Smokeless tobacco: Never Used  . Tobacco comment: a little more than 1 ppd (02/17/14)  Substance Use Topics  . Alcohol use: Yes    Comment: 1/2 galloon liquior in 2 days     12/28/2014 wife reports 32oz / day  . Drug use: No     Allergies   Coreg [carvedilol] and Aspartame and phenylalanine   Review of Systems Review of Systems  All systems reviewed and negative, other than as noted in HPI.  Physical Exam Updated Vital Signs BP (!) 154/88   Pulse (!) 117   Resp (!) 23   Ht 5\' 8"  (1.727 m)   Wt 95.3 kg (210 lb)   SpO2 94%   BMI 31.93 kg/m   Physical Exam    Constitutional: He appears well-developed and well-nourished. No distress.  Disheveled appearance  HENT:  Head: Normocephalic and atraumatic.  Eyes: Conjunctivae are normal. Right eye exhibits no discharge. Left eye exhibits no discharge.  Neck: Neck supple.  Cardiovascular: Regular rhythm and normal heart sounds. Exam reveals no gallop and no friction rub.  No murmur heard. tachycardia  Pulmonary/Chest: Effort normal and breath sounds normal. No respiratory distress.  Abdominal: Soft. He exhibits no distension. There is no tenderness.  Very large and reducible umbilical hernia.  No tenderness.  Musculoskeletal: He exhibits no edema or tenderness.  Neurological: He is alert.  Skin: Skin is warm  and dry.  Psychiatric: He has a normal mood and affect. His behavior is normal. Thought content normal.  Nursing note and vitals reviewed.    ED Treatments / Results  Labs (all labs ordered are listed, but only abnormal results are displayed) Labs Reviewed  COMPREHENSIVE METABOLIC PANEL - Abnormal; Notable for the following components:      Result Value   Sodium 134 (*)    Chloride 95 (*)    Glucose, Bld 152 (*)    Calcium 8.0 (*)    AST 108 (*)    ALT 61 (*)    Anion gap 17 (*)    All other components within normal limits  CBC - Abnormal; Notable for the following components:   Hemoglobin 17.2 (*)    MCHC 36.1 (*)    All other components within normal limits  ETHANOL - Abnormal; Notable for the following components:   Alcohol, Ethyl (B) 229 (*)    All other components within normal limits  CBG MONITORING, ED - Abnormal; Notable for the following components:   Glucose-Capillary 156 (*)    All other components within normal limits  LIPASE, BLOOD    EKG None  Radiology Dg Chest 2 View  Result Date: 09/09/2017 CLINICAL DATA:  Nausea and vomiting for 1 week EXAM: CHEST - 2 VIEW COMPARISON:  03/25/2017 FINDINGS: Cardiac shadow is at the upper limits of normal in size. The lungs  are well aerated bilaterally. Patchy density is again seen in the right upper lobe. Degenerative changes of the thoracic spine are noted. IMPRESSION: Patchy density in the right upper lobe. Given its persistence from the prior exam this may simply represent scarring although CT of the chest is recommended for further evaluation. Electronically Signed   By: Alcide Clever M.D.   On: 09/09/2017 11:17   Ct Chest W Contrast  Result Date: 09/09/2017 CLINICAL DATA:  Abnormal chest x-ray with focal density in the right upper lobe. EXAM: CT CHEST WITH CONTRAST TECHNIQUE: Multidetector CT imaging of the chest was performed during intravenous contrast administration. CONTRAST:  75mL OMNIPAQUE IOHEXOL 300 MG/ML  SOLN COMPARISON:  Chest x-ray 09/09/2017 and chest CTs dated 12/15/2015 and 12/16/2014 and abdominal CT dated 01/23/2014 FINDINGS: Cardiovascular: Heart size is normal. No pericardial effusion. Coronary artery calcification. Aortic atherosclerosis. Mediastinum/Nodes: No enlarged mediastinal, hilar, or axillary lymph nodes. Thyroid gland, trachea, and esophagus demonstrate no significant findings. Lungs/Pleura: Chronic progressive bronchiectasis and extensive chronic peribronchial thickening. The bronchiectasis is most prominent in the right upper lobe. There are multiple small focal areas of parenchymal scarring, slightly more prominent than on the prior studies. No worrisome mass lesions. Focal soft tissue density occludes 1 of the left lower lobe bronchi best seen on image 86 of series 7. This has a more tubular appearance on series 801 of the chest CT dated 12/15/2015. This is most likely chronic inspissated mucus. Scattered emphysematous blebs in the right lung.  No effusions. Upper Abdomen: Hepatomegaly with diffuse hepatic steatosis. Stable 2.3 cm low-density lesion on the upper pole the left kidney consistent a cyst. Musculoskeletal: No acute bone abnormality. Chronic accentuation of the thoracic kyphosis. Old  compression fracture of L1. IMPRESSION: 1. Chronic progressive inflammatory changes primarily in the right upper lobe with focal areas of progressive scarring and cylindrical bronchiectasis primarily in the right upper lobe. 2. Chronic peribronchial thickening in both lungs with a focal area of chronic inspissated mucus in 1 of the bronchi in the left lower lobe. 3. Aortic Atherosclerosis (ICD10-I70.0) and Emphysema (  ICD10-J43.9). 4. Hepatic steatosis and hepatomegaly, chronic. Electronically Signed   By: Francene Boyers M.D.   On: 09/09/2017 13:36    Procedures Procedures (including critical care time)  Medications Ordered in ED Medications  loperamide (IMODIUM) capsule 4 mg (4 mg Oral Not Given 09/09/17 1027)  sodium chloride 0.9 % bolus 1,000 mL (0 mLs Intravenous Stopped 09/09/17 1127)  ondansetron (ZOFRAN) injection 4 mg (4 mg Intravenous Given 09/09/17 1027)  LORazepam (ATIVAN) injection 1 mg (1 mg Intravenous Given 09/09/17 1027)  LORazepam (ATIVAN) injection 1 mg (1 mg Intravenous Given 09/09/17 1247)  iohexol (OMNIPAQUE) 300 MG/ML solution 75 mL (75 mLs Intravenous Contrast Given 09/09/17 1300)  LORazepam (ATIVAN) injection 2 mg (2 mg Intravenous Given 09/09/17 1511)     Initial Impression / Assessment and Plan / ED Course  I have reviewed the triage vital signs and the nursing notes.  Pertinent labs & imaging results that were available during my care of the patient were reviewed by me and considered in my medical decision making (see chart for details).    67yM with alcohol dependence. Pt wants to go home. Wife is very upset with decision.  Explained that loss of appetite, poor sleep, no energy and many other symptoms are common in chronic heavy alcohol abuse. He is hypertensive and tachycardic. No tremor though and his mentation is fine. He is not motivated to quit at this time and will realistically go home and continue to drink. Provided with outpt resources.   Final Clinical Impressions(s)  / ED Diagnoses   Final diagnoses:  Alcohol dependence with uncomplicated intoxication (HCC)  Diarrhea, unspecified type  Malnutrition, unspecified type [facility])    ED Discharge Orders    None       Raeford Razor, MD 09/12/17 [id number]

## 2017-09-16 DIAGNOSIS — R69 Illness, unspecified: Secondary | ICD-10-CM | POA: Diagnosis not present

## 2017-09-27 ENCOUNTER — Inpatient Hospital Stay (HOSPITAL_COMMUNITY)
Admission: EM | Admit: 2017-09-27 | Discharge: 2017-10-03 | DRG: 194 | Disposition: A | Discharge status: Home / Self Care | Payer: Medicare HMO | Attending: Family Medicine | Admitting: Family Medicine

## 2017-09-27 ENCOUNTER — Emergency Department (HOSPITAL_COMMUNITY): Payer: Medicare HMO

## 2017-09-27 ENCOUNTER — Inpatient Hospital Stay (HOSPITAL_COMMUNITY): Payer: Medicare HMO

## 2017-09-27 ENCOUNTER — Other Ambulatory Visit: Payer: Self-pay

## 2017-09-27 DIAGNOSIS — R0602 Shortness of breath: Secondary | ICD-10-CM

## 2017-09-27 DIAGNOSIS — F10229 Alcohol dependence with intoxication, unspecified: Secondary | ICD-10-CM | POA: Diagnosis not present

## 2017-09-27 DIAGNOSIS — F1020 Alcohol dependence, uncomplicated: Secondary | ICD-10-CM

## 2017-09-27 DIAGNOSIS — R69 Illness, unspecified: Secondary | ICD-10-CM | POA: Diagnosis not present

## 2017-09-27 DIAGNOSIS — K429 Umbilical hernia without obstruction or gangrene: Secondary | ICD-10-CM | POA: Diagnosis present

## 2017-09-27 DIAGNOSIS — Z9181 History of falling: Secondary | ICD-10-CM | POA: Diagnosis not present

## 2017-09-27 DIAGNOSIS — I110 Hypertensive heart disease with heart failure: Secondary | ICD-10-CM | POA: Diagnosis present

## 2017-09-27 DIAGNOSIS — E669 Obesity, unspecified: Secondary | ICD-10-CM | POA: Diagnosis present

## 2017-09-27 DIAGNOSIS — E860 Dehydration: Secondary | ICD-10-CM | POA: Diagnosis not present

## 2017-09-27 DIAGNOSIS — Z6830 Body mass index (BMI) 30.0-30.9, adult: Secondary | ICD-10-CM

## 2017-09-27 DIAGNOSIS — J189 Pneumonia, unspecified organism: Secondary | ICD-10-CM | POA: Diagnosis not present

## 2017-09-27 DIAGNOSIS — J479 Bronchiectasis, uncomplicated: Secondary | ICD-10-CM | POA: Diagnosis not present

## 2017-09-27 DIAGNOSIS — F17210 Nicotine dependence, cigarettes, uncomplicated: Secondary | ICD-10-CM | POA: Diagnosis present

## 2017-09-27 DIAGNOSIS — F329 Major depressive disorder, single episode, unspecified: Secondary | ICD-10-CM | POA: Diagnosis present

## 2017-09-27 DIAGNOSIS — E876 Hypokalemia: Secondary | ICD-10-CM | POA: Diagnosis present

## 2017-09-27 DIAGNOSIS — I10 Essential (primary) hypertension: Secondary | ICD-10-CM | POA: Diagnosis not present

## 2017-09-27 DIAGNOSIS — I426 Alcoholic cardiomyopathy: Secondary | ICD-10-CM | POA: Diagnosis present

## 2017-09-27 DIAGNOSIS — E559 Vitamin D deficiency, unspecified: Secondary | ICD-10-CM | POA: Diagnosis present

## 2017-09-27 DIAGNOSIS — Z79899 Other long term (current) drug therapy: Secondary | ICD-10-CM

## 2017-09-27 DIAGNOSIS — E871 Hypo-osmolality and hyponatremia: Secondary | ICD-10-CM | POA: Diagnosis present

## 2017-09-27 DIAGNOSIS — I4510 Unspecified right bundle-branch block: Secondary | ICD-10-CM | POA: Diagnosis not present

## 2017-09-27 DIAGNOSIS — R0689 Other abnormalities of breathing: Secondary | ICD-10-CM | POA: Diagnosis not present

## 2017-09-27 DIAGNOSIS — R251 Tremor, unspecified: Secondary | ICD-10-CM | POA: Diagnosis present

## 2017-09-27 DIAGNOSIS — J471 Bronchiectasis with (acute) exacerbation: Secondary | ICD-10-CM | POA: Diagnosis not present

## 2017-09-27 DIAGNOSIS — D649 Anemia, unspecified: Secondary | ICD-10-CM | POA: Diagnosis present

## 2017-09-27 DIAGNOSIS — R52 Pain, unspecified: Secondary | ICD-10-CM | POA: Diagnosis not present

## 2017-09-27 DIAGNOSIS — I472 Ventricular tachycardia: Secondary | ICD-10-CM | POA: Diagnosis not present

## 2017-09-27 DIAGNOSIS — I5023 Acute on chronic systolic (congestive) heart failure: Secondary | ICD-10-CM

## 2017-09-27 DIAGNOSIS — I5042 Chronic combined systolic (congestive) and diastolic (congestive) heart failure: Secondary | ICD-10-CM | POA: Diagnosis not present

## 2017-09-27 DIAGNOSIS — I5022 Chronic systolic (congestive) heart failure: Secondary | ICD-10-CM | POA: Diagnosis not present

## 2017-09-27 DIAGNOSIS — I5030 Unspecified diastolic (congestive) heart failure: Secondary | ICD-10-CM | POA: Diagnosis not present

## 2017-09-27 DIAGNOSIS — A419 Sepsis, unspecified organism: Secondary | ICD-10-CM | POA: Diagnosis not present

## 2017-09-27 DIAGNOSIS — G252 Other specified forms of tremor: Secondary | ICD-10-CM | POA: Diagnosis present

## 2017-09-27 DIAGNOSIS — R197 Diarrhea, unspecified: Secondary | ICD-10-CM | POA: Diagnosis not present

## 2017-09-27 DIAGNOSIS — E785 Hyperlipidemia, unspecified: Secondary | ICD-10-CM | POA: Diagnosis present

## 2017-09-27 DIAGNOSIS — Z8701 Personal history of pneumonia (recurrent): Secondary | ICD-10-CM

## 2017-09-27 DIAGNOSIS — J440 Chronic obstructive pulmonary disease with acute lower respiratory infection: Secondary | ICD-10-CM | POA: Diagnosis not present

## 2017-09-27 DIAGNOSIS — I4891 Unspecified atrial fibrillation: Secondary | ICD-10-CM | POA: Diagnosis not present

## 2017-09-27 DIAGNOSIS — R000 Tachycardia, unspecified: Secondary | ICD-10-CM | POA: Diagnosis not present

## 2017-09-27 DIAGNOSIS — Z888 Allergy status to other drugs, medicaments and biological substances status: Secondary | ICD-10-CM

## 2017-09-27 DIAGNOSIS — Z7951 Long term (current) use of inhaled steroids: Secondary | ICD-10-CM

## 2017-09-27 DIAGNOSIS — R0603 Acute respiratory distress: Secondary | ICD-10-CM

## 2017-09-27 DIAGNOSIS — R001 Bradycardia, unspecified: Secondary | ICD-10-CM | POA: Diagnosis not present

## 2017-09-27 DIAGNOSIS — F419 Anxiety disorder, unspecified: Secondary | ICD-10-CM | POA: Diagnosis present

## 2017-09-27 DIAGNOSIS — R0600 Dyspnea, unspecified: Secondary | ICD-10-CM

## 2017-09-27 DIAGNOSIS — Z7982 Long term (current) use of aspirin: Secondary | ICD-10-CM

## 2017-09-27 DIAGNOSIS — I5043 Acute on chronic combined systolic (congestive) and diastolic (congestive) heart failure: Secondary | ICD-10-CM | POA: Diagnosis not present

## 2017-09-27 DIAGNOSIS — G250 Essential tremor: Secondary | ICD-10-CM | POA: Diagnosis not present

## 2017-09-27 HISTORY — DX: Chronic obstructive pulmonary disease with (acute) exacerbation: J44.1

## 2017-09-27 HISTORY — DX: Tremor, unspecified: R25.1

## 2017-09-27 HISTORY — DX: Chronic diastolic (congestive) heart failure: I50.32

## 2017-09-27 HISTORY — DX: Solitary pulmonary nodule: R91.1

## 2017-09-27 HISTORY — DX: Unspecified right bundle-branch block: I45.10

## 2017-09-27 HISTORY — DX: Umbilical hernia without obstruction or gangrene: K42.9

## 2017-09-27 LAB — COMPREHENSIVE METABOLIC PANEL
ALT: 31 U/L (ref 0–44)
AST: 63 U/L — AB (ref 15–41)
Albumin: 3.6 g/dL (ref 3.5–5.0)
Alkaline Phosphatase: 94 U/L (ref 38–126)
Anion gap: 18 — ABNORMAL HIGH (ref 5–15)
BILIRUBIN TOTAL: 1.3 mg/dL — AB (ref 0.3–1.2)
BUN: 6 mg/dL — AB (ref 8–23)
CO2: 21 mmol/L — ABNORMAL LOW (ref 22–32)
CREATININE: 0.94 mg/dL (ref 0.61–1.24)
Calcium: 7.9 mg/dL — ABNORMAL LOW (ref 8.9–10.3)
Chloride: 89 mmol/L — ABNORMAL LOW (ref 98–111)
Glucose, Bld: 134 mg/dL — ABNORMAL HIGH (ref 70–99)
POTASSIUM: 3.2 mmol/L — AB (ref 3.5–5.1)
Sodium: 128 mmol/L — ABNORMAL LOW (ref 135–145)
TOTAL PROTEIN: 6.5 g/dL (ref 6.5–8.1)

## 2017-09-27 LAB — URINALYSIS, ROUTINE W REFLEX MICROSCOPIC
Bacteria, UA: NONE SEEN
Bilirubin Urine: NEGATIVE
GLUCOSE, UA: NEGATIVE mg/dL
Ketones, ur: NEGATIVE mg/dL
Leukocytes, UA: NEGATIVE
Nitrite: NEGATIVE
PH: 6 (ref 5.0–8.0)
Protein, ur: 30 mg/dL — AB
Specific Gravity, Urine: 1.009 (ref 1.005–1.030)

## 2017-09-27 LAB — LACTIC ACID, PLASMA: LACTIC ACID, VENOUS: 5.3 mmol/L — AB (ref 0.5–1.9)

## 2017-09-27 LAB — I-STAT TROPONIN, ED: Troponin i, poc: 0.08 ng/mL (ref 0.00–0.08)

## 2017-09-27 LAB — CBC
HEMATOCRIT: 42.9 % (ref 39.0–52.0)
Hemoglobin: 15 g/dL (ref 13.0–17.0)
MCH: 30.5 pg (ref 26.0–34.0)
MCHC: 35 g/dL (ref 30.0–36.0)
MCV: 87.4 fL (ref 78.0–100.0)
Platelets: 238 10*3/uL (ref 150–400)
RBC: 4.91 MIL/uL (ref 4.22–5.81)
RDW: 13.5 % (ref 11.5–15.5)
WBC: 8.5 10*3/uL (ref 4.0–10.5)

## 2017-09-27 LAB — I-STAT CG4 LACTIC ACID, ED: Lactic Acid, Venous: 4.82 mmol/L (ref 0.5–1.9)

## 2017-09-27 LAB — LIPASE, BLOOD: LIPASE: 24 U/L (ref 11–51)

## 2017-09-27 MED ORDER — LORAZEPAM 2 MG/ML IJ SOLN
1.0000 mg | Freq: Once | INTRAMUSCULAR | Status: AC
  Administered 2017-09-28: 1 mg via INTRAVENOUS

## 2017-09-27 MED ORDER — DILTIAZEM HCL 25 MG/5ML IV SOLN
10.0000 mg | Freq: Once | INTRAVENOUS | Status: AC
  Administered 2017-09-28: 10 mg via INTRAVENOUS
  Filled 2017-09-27: qty 5

## 2017-09-27 MED ORDER — FOLIC ACID 1 MG PO TABS
1.0000 mg | ORAL_TABLET | Freq: Every day | ORAL | Status: DC
  Administered 2017-09-28 – 2017-10-03 (×6): 1 mg via ORAL
  Filled 2017-09-27 (×6): qty 1

## 2017-09-27 MED ORDER — IPRATROPIUM-ALBUTEROL 20-100 MCG/ACT IN AERS: 1.0000 | INHALATION_SPRAY | RESPIRATORY_TRACT | Status: DC

## 2017-09-27 MED ORDER — SODIUM CHLORIDE 0.9 % IV SOLN
INTRAVENOUS | Status: DC
  Administered 2017-09-27: 20:00:00 via INTRAVENOUS

## 2017-09-27 MED ORDER — IPRATROPIUM-ALBUTEROL 0.5-2.5 (3) MG/3ML IN SOLN
3.0000 mL | Freq: Once | RESPIRATORY_TRACT | Status: AC
  Administered 2017-09-27: 3 mL via RESPIRATORY_TRACT
  Filled 2017-09-27: qty 3

## 2017-09-27 MED ORDER — LORAZEPAM 2 MG/ML IJ SOLN
1.0000 mg | Freq: Four times a day (QID) | INTRAMUSCULAR | Status: AC | PRN
  Administered 2017-09-27: 1 mg via INTRAVENOUS
  Filled 2017-09-27: qty 1

## 2017-09-27 MED ORDER — VANCOMYCIN HCL 10 G IV SOLR
1250.0000 mg | Freq: Two times a day (BID) | INTRAVENOUS | Status: DC
  Administered 2017-09-28 – 2017-09-30 (×4): 1250 mg via INTRAVENOUS
  Filled 2017-09-27 (×5): qty 1250

## 2017-09-27 MED ORDER — BUDESONIDE 0.5 MG/2ML IN SUSP
0.5000 mg | Freq: Two times a day (BID) | RESPIRATORY_TRACT | Status: DC
  Administered 2017-09-28 – 2017-10-03 (×11): 0.5 mg via RESPIRATORY_TRACT
  Filled 2017-09-27 (×11): qty 2

## 2017-09-27 MED ORDER — ENOXAPARIN SODIUM 40 MG/0.4ML ~~LOC~~ SOLN
40.0000 mg | SUBCUTANEOUS | Status: DC
  Administered 2017-09-27 – 2017-10-02 (×6): 40 mg via SUBCUTANEOUS
  Filled 2017-09-27 (×6): qty 0.4

## 2017-09-27 MED ORDER — VANCOMYCIN 50 MG/ML ORAL SOLUTION
125.0000 mg | Freq: Once | ORAL | Status: AC
  Administered 2017-09-27: 125 mg via ORAL
  Filled 2017-09-27: qty 2.5

## 2017-09-27 MED ORDER — VENLAFAXINE HCL ER 75 MG PO CP24
150.0000 mg | ORAL_CAPSULE | Freq: Every day | ORAL | Status: DC
Start: 2017-09-28 — End: 2017-10-03
  Administered 2017-09-28 – 2017-10-03 (×6): 150 mg via ORAL
  Filled 2017-09-27 (×6): qty 2

## 2017-09-27 MED ORDER — LORAZEPAM 1 MG PO TABS
1.0000 mg | ORAL_TABLET | Freq: Four times a day (QID) | ORAL | Status: AC | PRN
  Administered 2017-09-29 (×2): 1 mg via ORAL
  Filled 2017-09-27 (×2): qty 1

## 2017-09-27 MED ORDER — VANCOMYCIN HCL 10 G IV SOLR
1500.0000 mg | INTRAVENOUS | Status: AC
  Administered 2017-09-27: 1500 mg via INTRAVENOUS
  Filled 2017-09-27: qty 1500

## 2017-09-27 MED ORDER — ONDANSETRON HCL 4 MG/2ML IJ SOLN
4.0000 mg | Freq: Four times a day (QID) | INTRAMUSCULAR | Status: DC | PRN
  Administered 2017-10-02: 4 mg via INTRAVENOUS
  Filled 2017-09-27: qty 2

## 2017-09-27 MED ORDER — SODIUM CHLORIDE 0.9 % IV BOLUS
1000.0000 mL | Freq: Once | INTRAVENOUS | Status: AC
  Administered 2017-09-27: 1000 mL via INTRAVENOUS

## 2017-09-27 MED ORDER — FUROSEMIDE 10 MG/ML IJ SOLN
INTRAMUSCULAR | Status: AC
  Filled 2017-09-27: qty 2

## 2017-09-27 MED ORDER — PIPERACILLIN-TAZOBACTAM 3.375 G IVPB
3.3750 g | Freq: Three times a day (TID) | INTRAVENOUS | Status: DC
  Administered 2017-09-28 – 2017-09-30 (×8): 3.375 g via INTRAVENOUS
  Filled 2017-09-27 (×8): qty 50

## 2017-09-27 MED ORDER — METOPROLOL TARTRATE 5 MG/5ML IV SOLN
INTRAVENOUS | Status: AC
  Filled 2017-09-27: qty 5

## 2017-09-27 MED ORDER — ATORVASTATIN CALCIUM 40 MG PO TABS
40.0000 mg | ORAL_TABLET | Freq: Every day | ORAL | Status: DC
  Administered 2017-09-27 – 2017-10-02 (×6): 40 mg via ORAL
  Filled 2017-09-27 (×6): qty 1

## 2017-09-27 MED ORDER — THIAMINE HCL 100 MG/ML IJ SOLN
100.0000 mg | Freq: Every day | INTRAMUSCULAR | Status: DC
  Administered 2017-09-28: 100 mg via INTRAVENOUS
  Filled 2017-09-27 (×2): qty 2

## 2017-09-27 MED ORDER — PIPERACILLIN-TAZOBACTAM 3.375 G IVPB 30 MIN
3.3750 g | Freq: Once | INTRAVENOUS | Status: AC
  Administered 2017-09-27: 3.375 g via INTRAVENOUS
  Filled 2017-09-27: qty 50

## 2017-09-27 MED ORDER — IPRATROPIUM BROMIDE 0.02 % IN SOLN
0.5000 mg | Freq: Four times a day (QID) | RESPIRATORY_TRACT | Status: DC
  Administered 2017-09-27: 0.5 mg via RESPIRATORY_TRACT
  Filled 2017-09-27: qty 2.5

## 2017-09-27 MED ORDER — ACETAMINOPHEN 650 MG RE SUPP: 650.0000 mg | Freq: Four times a day (QID) | RECTAL | Status: DC | PRN

## 2017-09-27 MED ORDER — VANCOMYCIN 50 MG/ML ORAL SOLUTION: 125.0000 mg | Freq: Four times a day (QID) | ORAL | Status: DC

## 2017-09-27 MED ORDER — ACETAMINOPHEN 325 MG PO TABS
650.0000 mg | ORAL_TABLET | Freq: Four times a day (QID) | ORAL | Status: DC | PRN
  Administered 2017-10-02: 650 mg via ORAL
  Filled 2017-09-27: qty 2

## 2017-09-27 MED ORDER — VITAMIN B-1 100 MG PO TABS
100.0000 mg | ORAL_TABLET | Freq: Every day | ORAL | Status: DC
  Administered 2017-09-29 – 2017-10-03 (×5): 100 mg via ORAL
  Filled 2017-09-27 (×5): qty 1

## 2017-09-27 MED ORDER — FUROSEMIDE 10 MG/ML IJ SOLN
20.0000 mg | Freq: Once | INTRAMUSCULAR | Status: AC
  Administered 2017-09-28: 20 mg via INTRAVENOUS

## 2017-09-27 MED ORDER — ONDANSETRON HCL 4 MG PO TABS: 4.0000 mg | ORAL_TABLET | Freq: Four times a day (QID) | ORAL | Status: DC | PRN

## 2017-09-27 MED ORDER — POTASSIUM CHLORIDE CRYS ER 20 MEQ PO TBCR
40.0000 meq | EXTENDED_RELEASE_TABLET | Freq: Two times a day (BID) | ORAL | Status: DC
  Administered 2017-09-27: 40 meq via ORAL
  Filled 2017-09-27: qty 2

## 2017-09-27 MED ORDER — ADULT MULTIVITAMIN W/MINERALS CH
1.0000 | ORAL_TABLET | Freq: Every day | ORAL | Status: DC
  Administered 2017-09-28 – 2017-10-03 (×6): 1 via ORAL
  Filled 2017-09-27 (×6): qty 1

## 2017-09-27 MED ORDER — ONDANSETRON HCL 4 MG/2ML IJ SOLN
4.0000 mg | Freq: Once | INTRAMUSCULAR | Status: DC
  Filled 2017-09-27: qty 2

## 2017-09-27 MED ORDER — SODIUM CHLORIDE 0.9 % IV BOLUS
500.0000 mL | Freq: Once | INTRAVENOUS | Status: AC
  Administered 2017-09-27: 500 mL via INTRAVENOUS

## 2017-09-27 MED ORDER — LORAZEPAM 2 MG/ML IJ SOLN
INTRAMUSCULAR | Status: AC
  Filled 2017-09-27: qty 1

## 2017-09-27 NOTE — Progress Notes (Signed)
Family Medicine Progress note  Called to bedside for patient due to increased work of breathing. Previously got an additional 1L bolus and increased fluids to 159mL/hr. Patient now with crackles in Bilateral lung bases. He also has scattered wheezes in all lung bases. Will start patient on bipap due to these exam findings. Could consider adding lasix, but unclear benefit in setting of fluid boluses and 3/4 SIRS. Continue vanc, zosyn. Can re-eval breathing in 2-3 hours and proceed from there.  Myrene Buddy MD PGY-2 Family Medicine Resident

## 2017-09-27 NOTE — ED Triage Notes (Signed)
Pt here from home for weakness, nvd since being tx at [facility] on 8/6.  Vs 192/135, rr 36, cap 26, 97% on 2L, hr 120 with momentary runs of svt.  bil 18g.  Pt given 200 ns en-route.

## 2017-09-27 NOTE — ED Notes (Signed)
Attempted report 

## 2017-09-27 NOTE — Progress Notes (Signed)
Pt transported to 5w21 via bed. Per pt request, wife called and notified of change in level of care and given new room number. Her phone number updated in chart.

## 2017-09-27 NOTE — ED Provider Notes (Signed)
[facility] Emergency Department Provider Note MRN:  [id number]  Arrival date & time: 09/27/17     Chief Complaint   Weakness   History of Present Illness   Juan [name] is a 67 y.o. year-old male with a history of COPD, CHF, hypertension presenting to the ED with chief complaint of diarrhea.  Patient has been experiencing 3 weeks of constant profuse watery diarrhea, nonbloody.  Diarrhea with nausea and nonbloody nonbilious emesis occurring multiple times throughout the day ever since being discharged from [facility].  Patient is unsure of receiving any recent antibiotics, denies recent travel.  Denies recent fever, no headache or vision change, no chest pain, endorsing gradually worsening shortness of breath today that he feels is consistent with his COPD acting up.  Denies any significant abdominal pain, no dysuria.  Review of Systems  A complete 10 system review of systems was obtained and all systems are negative except as noted in the HPI and PMH.   Patient's Health History    Past Medical History:  Diagnosis Date  . Alcoholism (HCC)   . Allergy    seasonal   . Anxiety   . Asthma   . CHF (congestive heart failure) (HCC) 10/2010   ECHO:  EF 40%, Grade II diastolic dysfunction  . COPD (chronic obstructive pulmonary disease) (HCC)   . Depression   . Headache   . Hyperlipidemia   . Hypertension   . Seizures (HCC)     Past Surgical History:  Procedure Laterality Date  . TOENAIL EXCISION Right 1967   Removal of ingrown toenail [Other]    Family History  Problem Relation Age of Onset  . Alzheimer's disease Mother   . Diabetes Mother   . Heart disease Maternal Uncle     Social History   Socioeconomic History  . Marital status: Married    Spouse name: Not on file  . Number of children: Not on file  . Years of education: Not on file  . Highest education level: Not on file  Occupational History  . Occupation: Unemployed  Social Needs    . Financial resource strain: Not on file  . Food insecurity:    Worry: Not on file    Inability: Not on file  . Transportation needs:    Medical: Not on file    Non-medical: Not on file  Tobacco Use  . Smoking status: Current Every Day Smoker    Packs/day: 1.50    Years: 40.00    Pack years: 60.00    Types: Cigarettes  . Smokeless tobacco: Never Used  . Tobacco comment: a little more than 1 ppd (02/17/14)  Substance and Sexual Activity  . Alcohol use: Yes    Comment: 1/2 galloon liquior in 2 days     12/28/2014 wife reports 32oz / day  . Drug use: No  . Sexual activity: Not on file  Lifestyle  . Physical activity:    Days per week: Not on file    Minutes per session: Not on file  . Stress: Not on file  Relationships  . Social connections:    Talks on phone: Not on file    Gets together: Not on file    Attends religious service: Not on file    Active member of club or organization: Not on file    Attends meetings of clubs or organizations: Not on file    Relationship status: Not on file  . Intimate partner violence:  Fear of current or ex partner: Not on file    Emotionally abused: Not on file    Physically abused: Not on file    Forced sexual activity: Not on file  Other Topics Concern  . Not on file  Social History Narrative   Lives with wife in [place].     Physical Exam  Vital Signs and Nursing Notes reviewed Vitals:   09/27/17 1400 09/27/17 1415  BP: (!) 176/107 (!) 184/111  Pulse: (!) 123 (!) 126  Resp: (!) 34 (!) 36  Temp:    SpO2: 97% 98%    CONSTITUTIONAL: Chronically ill-appearing, in mild distress due to discomfort NEURO:  Alert and oriented x 3, no focal deficits EYES:  eyes equal and reactive ENT/NECK:  no LAD, no JVD CARDIO: Tachycardic rate, well-perfused, normal S1 and S2 PULM:  CTAB no wheezing or rhonchi GI/GU:  normal bowel sounds, non-distended, non-tender, prominent ventral hernia MSK/SPINE:  No gross deformities, no edema SKIN:   no rash, atraumatic PSYCH:  Appropriate speech and behavior  Diagnostic and Interventional Summary    EKG Interpretation  Date/Time:    Ventricular Rate:    PR Interval:    QRS Duration:   QT Interval:    QTC Calculation:   R Axis:     Text Interpretation:        Labs Reviewed  COMPREHENSIVE METABOLIC PANEL - Abnormal; Notable for the following components:      Result Value   Sodium 128 (*)    Potassium 3.2 (*)    Chloride 89 (*)    CO2 21 (*)    Glucose, Bld 134 (*)    BUN 6 (*)    Calcium 7.9 (*)    AST 63 (*)    Total Bilirubin 1.3 (*)    Anion gap 18 (*)    All other components within normal limits  URINALYSIS, ROUTINE W REFLEX MICROSCOPIC - Abnormal; Notable for the following components:   Color, Urine STRAW (*)    Hgb urine dipstick MODERATE (*)    Protein, ur 30 (*)    All other components within normal limits  I-STAT CG4 LACTIC ACID, ED - Abnormal; Notable for the following components:   Lactic Acid, Venous 4.82 (*)    All other components within normal limits  GASTROINTESTINAL PANEL BY PCR, STOOL (REPLACES STOOL CULTURE)  CULTURE, BLOOD (ROUTINE X 2)  CULTURE, BLOOD (ROUTINE X 2)  CBC  LIPASE, BLOOD  I-STAT TROPONIN, ED    DG Abd Portable 1V  Final Result    DG Chest Port 1 View  Final Result      Medications  ondansetron (ZOFRAN) injection 4 mg (0 mg Intravenous Hold 09/27/17 1448)  sodium chloride 0.9 % bolus 500 mL (has no administration in time range)  piperacillin-tazobactam (ZOSYN) IVPB 3.375 g (has no administration in time range)  vancomycin (VANCOCIN) 50 mg/mL oral solution 125 mg (has no administration in time range)  sodium chloride 0.9 % bolus 1,000 mL (1,000 mLs Intravenous New Bag/Given 09/27/17 1443)  ipratropium-albuterol (DUONEB) 0.5-2.5 (3) MG/3ML nebulizer solution 3 mL (3 mLs Nebulization Given 09/27/17 1430)     Procedures Critical Care Critical Care Documentation Critical care time provided by me (excluding procedures): 35  minutes  Condition necessitating critical care: sepsis, concern for C. Diff colitis  Components of critical care management: reviewing of prior records, laboratory and imaging interpretation, frequent re-examination and reassessment of vital signs, administration of IV fluid resuscitation, IV and PO antibiotics, discussion with consultants  ED Course and Medical Decision Making  I have reviewed the triage vital signs and the nursing notes.  Pertinent labs & imaging results that were available during my care of the patient were reviewed by me and considered in my medical decision making (see below for details).    Concern for C. difficile colitis in this 67 year old male with persistent watery diarrhea since discharge earlier this month, when he was treated for community acquired pneumonia with Ceftin ear.  Tachycardic to 120s, ill-appearing, normotensive, abdomen largely soft and nontender, little to no concern for acute surgical process.  Given 1.5 L crystalloid, empirically treated with Zosyn, p.o. bank, stool studies pending.  Will admit to family medicine for further care and evaluation.  Elmer Sow. Pilar Plate, MD [facility] Emergency Medicine [facility] [email or web address]  Final Clinical Impressions(s) / ED Diagnoses     ICD-10-CM   1. Dehydration E86.0   2. SOB (shortness of breath) R06.02 DG Chest [facility] 1 View    DG Chest Port 1 View    CANCELED: DG Abd Acute W/Chest    CANCELED: DG Abd Acute W/Chest  3. Pain R52 DG Abd Portable 1V    DG Abd Portable 1V  4. Diarrhea of presumed infectious origin R19.7   5. Sepsis, due to unspecified organism [facility]) A41.9     ED Discharge Orders    None         Sabas Sous, MD 09/27/17 1623

## 2017-09-27 NOTE — H&P (Addendum)
Family Medicine Teaching [facility] Admission History and Physical Service Pager: [phone]  Patient name: Juan [name] Medical record number: [id number] Date of birth: 1950/08/30 Age: 67 y.o. Gender: male  Primary Care Provider: Tobey Grim, MD Consultants: None  Code Status: Full   Chief Complaint: diarrhea, abdominal pain   Assessment and Plan: Juan [name] is a 67 y.o. male presenting with dehydration and diarrhea x 3 weeks. PMH is significant for COPD, alcohol dependence with chronic tremor, hypertension, atrial fibrillation (not on a/c) , HFpEF, HLD, umbilical hernia, tobacco use, depression/anxiety, and hyponatremia.   Abdominal pain with watery diarrhea: Pt reports 3 weeks of intermittent watery non-bloody diarrhea, associated with cramping abdominal pain, that was further progressed in the past few days. No associated fever or vomiting at home. Minimal PO intake; drinking water, pepto-bismol, and bourbon 10-15 oz daily to help with symptoms. On arrival, he was afebrile, tachypneic, tachycardic, hypertensive and in mild distress due to discomfort. Physical exam notable for hypoactive bowel sounds, however no tenderness to abdomen with palpation, having active watery diarrhea during evaluation with foul odor. Abdomen XR with non-obstructive bowel gas pattern. Anion gap metabolic lactic acidosis noted, w/ AG18, CO2 21, K 3.2, LA 4.82, consistent with diarrhea and dehydration. AST 63, bilirubin 1.3. Lipase 24. No leukocytosis. U/A clear. Stool samples and blood cultures collected in the ED. S/p 1.5L NS fluid resuscitation and PO vancomycin in the ED. Concern for C.difficile colitis, with frequent watery diarrhea and potential history of antibiotic use noted by ED physician, however afebrile and no leukocytosis, and pt denies any recent antibiotics. Could consider gastroenteritis, however would expect concurrent vomiting. This could also be sequelae of his chronic alcohol use,  per chart review appears to be a intermittent complaint mentioned in previous admissions.    Will admit for IV hydration and workup of abdominal pain and diarrhea.  -Admit to Telemetry; Attending Dr. Gwendolyn Grant  -S/p 1.5L NS in the ED; cont 100 ml/hr NS -S/p Vancomycin in the ED; cont vancomycin and Zosyn per pharmacy  -F/u C. Diff and GI panel  -Trend lactic acid, CBC and CMP in the am  -Vitals per routine -CIWA protocol as below   Dyspnea, in setting of COPD: Pt reports increased dyspnea this morning, stating he's "just so exhausted" from his current ailments. Productive coughing at his baseline, no increase in sputum amount or purulence. Uses pulmicort BID and combivent respimat/atrovent neb treatments as needed for wheezing at home. On exam, he has an increased WOB on 4L [place] (satting 94-100%), speaking in short sentences, but generally clear lungs. No signs of fluid overload. CXR showing stable bronchiectasis and non-specific bronchiolitis changes. S/p duoneb treatment in the ED. Unlikely COPD exacerbation or pneumonia, given no increase in sputum volume or purulence and no consolidation on CXR. Could consider viral process with CXR bronchiolitic changes. Dyspnea could also just be secondary to recent exertion with BM, movement, and dehydration.  -S/p Duoneb treatment in the ED -Cont home atrovent neb QID -Cont home budesonide nebs BID  -Wean oxygen as tolerated    Alcohol Dependence: Drinks 10-15 oz bourbon daily. Last drink this morning, 2 oz. Chronic associated tremor and hyponatremia. High risk for withdrawal, especially as pt reports previous hospitalizations concerning alcohol use. Already received 1mg  Ativan in the ED due to elevated CIWA of 8.     -CIWA protocol  -Thiamine, folate, multivitamin   Hypokalemia: K+ 3.2 on admission.  Repleted with kdur 40 mEq x2.  -check AM BMP  Hyponatremia: Na 128, around baseline. Chronically low in the setting of alcohol use.  -Monitor BMP    Hypertension: BP 176/107 on arrival, continues to be elevated. History of hypertension, however not on any medications at home for this. Likely chronically elevated.  -Consider IV Labetalol 10mg  PRN SBP>180 or DBP >110   Atrial Fibrillation: HR ranging 120's. Not on anti-coagulation or taking his prescribed diltiazem. HR also potentially elevated from recent albuterol administration, dyspnea, and/or dehydration.  -mIVF 136ml/hr    -Consider rate control if continued tachycardia    HFpEF: Stable, no signs of fluid overload on exam. Echo in 2017 showing EF 50-55%, with akinesis of basal inferior wall. Not taking anything for this at home.    HLD: Last lipid panel in 04/2017, LDL 148.  -Continue home atorvastatin   Umbilical hernia: Stable. Massive, soft supraumbilical hernia, chronically unchanged since 2011. No intervention needed at this time.    Tobacco use: Smokes 1 PPD for a long time, offered nicotine patch, pt agreeable.  -14 mg nicotine patch  Depression/anxiety: Stable. Not endorsing concerns currently.  -Cont home Venlafaxine 150mg  daily    FEN/GI: Regular diet as tolerated, IV in place mIVF 100 ml/hr, K repleted 40 x2  Prophylaxis: Lovenox   Disposition: Admit to telemetry, attending Dr. Gwendolyn Grant   History of Present Illness:  Juan [name] is a 67 y.o. male, with a past history for hypertension, atrial fibrillation, COPD, HFpEF, and ?recent antibiotic therapy for CAP on 8/6 presenting with frequent watery diarrhea, abdominal pain, and lack of appetite. He states he has had intermittent watery, non-bloody, black/brown diarrhea for the past three weeks, further progressing in the past few days. Associated cramping, non-radiating, lower abdominal pain prior to bowel movements. He has not been able to eat anything, however has been drinking water, ginger ale, and 10-15 oz bourbon daily to help with symptoms. Last drank bourbon this morning, about 2oz. He has been using pepto  bismol with some improvement in his symptoms. Pt states he hasn't had any antibiotics since February, however ED physician notes he was treated for CAP with antibiotics earlier this month. Denies any recent fever, chills, CP, or vomiting. He was recently seen in the ED for similar complaints on 8/6, thought to be due to his chronic alcohol abuse and sent home.   He also endorses increased shortness of breath starting this morning, attributes this to being exhausted from his diarrhea. He has been coughing with some clear to white sputum production, however states this is his baseline.   On arrival to the ED, he was afebrile, tachycardic to 120's, tachypneic to 30's, and hypertensive to 176/107.  Noted to be in mild distress due to discomfort. Abdomen XR showing non-obstructive bowel gas pattern. CXR showing stable bronchiectasis with mild patchy opacity in the R upper parahilar lung, compatible with non-specific bronchiolitis. CBC and CMP notable for Na 128, K 3.2, chloride 89, Co2 21, Cr 0.94, Anion gap 18, Bilirubin 1.3, WBC 8.5, and Hgb 15. Lactic acid 4.82. Troponin POC 0.08. U/A clear, SG 1.009. Blood cultures obtained. Stool sampled collected. He received 1.5L of NS, PO vancomycin, and 1 mg of Ativan in the ED.    Review Of Systems: Per HPI with the following additions:   Review of Systems  Constitutional: Negative for chills and fever.  Respiratory: Positive for cough, sputum production and shortness of breath.   Cardiovascular: Negative for chest pain and palpitations.  Gastrointestinal: Positive for abdominal pain, diarrhea and nausea. Negative for blood  in stool, heartburn, melena and vomiting.  Genitourinary: Negative for dysuria, frequency and urgency.    Patient Active Problem List   Diagnosis Date Noted  . Dehydration 09/27/2017  . Cataract 06/26/2017  . Pulsus paradoxus 04/09/2017  . COPD exacerbation (HCC)   . Alcoholism (HCC) 11/14/2014  . Atrial fibrillation with rapid  ventricular response (HCC) 11/14/2014  . Chronic diastolic heart failure (HCC)   . Peripheral neuropathy 12/15/2013  . Bronchiectasis without acute exacerbation (HCC) 06/30/2013  . Lung nodule seen on imaging study 06/15/2013  . RBBB 11/09/2012  . Hyponatremia 11/08/2012  . Umbilical hernia 08/06/2012  . Smoker unmotivated to quit 07/22/2012  . Tremor 07/30/2011  . HTN (hypertension) 07/30/2011  . Hepatomegaly 11/05/2010  . Depression 11/05/2010  . Anxiety 11/05/2010    Past Medical History: Past Medical History:  Diagnosis Date  . Alcoholism (HCC)   . Allergy    seasonal   . Anxiety   . Asthma   . CHF (congestive heart failure) (HCC) 10/2010   ECHO:  EF 40%, Grade II diastolic dysfunction  . COPD (chronic obstructive pulmonary disease) (HCC)   . Depression   . Headache   . Hyperlipidemia   . Hypertension   . Seizures (HCC)     Past Surgical History: Past Surgical History:  Procedure Laterality Date  . TOENAIL EXCISION Right 1967   Removal of ingrown toenail [Other]    Social History: Social History   Tobacco Use  . Smoking status: Current Every Day Smoker    Packs/day: 1.50    Years: 40.00    Pack years: 60.00    Types: Cigarettes  . Smokeless tobacco: Never Used  . Tobacco comment: a little more than 1 ppd (02/17/14)  Substance Use Topics  . Alcohol use: Yes    Comment: 1/2 galloon liquior in 2 days     12/28/2014 wife reports 32oz / day  . Drug use: No   Additional social history: Lives at home with his wife and 2 cats, +daily tobacco use, daily alcohol use.  No illicit drug use.  Please also refer to relevant sections of EMR.  Family History: Family History  Problem Relation Age of Onset  . Alzheimer's disease Mother   . Diabetes Mother   . Heart disease Maternal Uncle     Allergies and Medications: Allergies  Allergen Reactions  . Coreg [Carvedilol] Shortness Of Breath  . Aspartame And Phenylalanine Nausea And Vomiting   No current  facility-administered medications on file prior to encounter.    Current Outpatient Medications on File Prior to Encounter  Medication Sig Dispense Refill  . acetaminophen (TYLENOL) 325 MG tablet Take 2 tablets (650 mg total) by mouth every 6 (six) hours as needed for moderate pain. 30 tablet 0  . albuterol (PROVENTIL) (2.5 MG/3ML) 0.083% nebulizer solution INHALE 1 VIAL VIA NEBULIZER EVERY 6 HOURS AS NEEDED FOR WHEEZING OR SHORTNESS OF BREATH 375 mL 1  . aspirin 81 MG chewable tablet Chew 1 tablet (81 mg total) by mouth daily. 30 tablet 0  . atorvastatin (LIPITOR) 40 MG tablet Take 1 tablet (40 mg total) by mouth daily. 90 tablet 3  . budesonide (PULMICORT) 0.5 MG/2ML nebulizer solution Take 2 mLs (0.5 mg total) by nebulization 2 (two) times daily. 2 mL 3  . clonazePAM (KLONOPIN) 0.5 MG tablet TAKE 1 TABLET BY MOUTH TWICE A DAY AS NEEDED (Patient taking differently: Take 0.5 mg by mouth 3 (three) times daily as needed for anxiety. ) 60 tablet 2  .  EPINEPHrine Base (PRIMATENE MIST IN) Inhale 1 puff into the lungs as needed (for breathing).    Marland Kitchen guaiFENesin (MUCINEX) 600 MG 12 hr tablet Take 600 mg by mouth 2 (two) times daily as needed for cough or to loosen phlegm.    Marland Kitchen ipratropium (ATROVENT) 0.02 % nebulizer solution Take 2.5 mLs (0.5 mg total) by nebulization 4 (four) times daily. Please dispense QS x 1 month 62.5 mL 3  . Ipratropium-Albuterol (COMBIVENT RESPIMAT) 20-100 MCG/ACT AERS respimat INHALE 1-2 PUFFS INTO THE LUNGS 6 (SIX) TIMES DAILY AS NEEDED FOR WHEEZING.  Please dispense 2 inhalers per month (Patient taking differently: Inhale 1-2 puffs into the lungs See admin instructions. INHALE 1-2 PUFFS INTO THE LUNGS 6 (SIX) TIMES DAILY AS NEEDED FOR WHEEZING.  Please dispense 2 inhalers per month) 4 g 6  . Multiple Vitamin (MULTIVITAMIN WITH MINERALS) TABS tablet Take 1 tablet by mouth daily as needed (for vitamin).     . phenylephrine (4-WAY FAST ACTING) 1 % nasal spray Place 1 drop into both  nostrils every 6 (six) hours as needed for congestion (4 way nasal spray).    . sodium chloride (OCEAN) 0.65 % SOLN nasal spray Place 1 spray into both nostrils as needed for congestion.    . sodium chloride 1 g tablet Take 1 g by mouth daily.    Marland Kitchen venlafaxine XR (EFFEXOR-XR) 150 MG 24 hr capsule TAKE 1 CAPSULE ONCE DAILY WITH BREAKFAST. (Patient taking differently: Take 150 mg by mouth daily with breakfast. ) 90 capsule 1  . diltiazem (CARDIZEM) 30 MG tablet Take 1 tablet (30 mg total) by mouth every 6 (six) hours. (Patient not taking: Reported on 09/09/2017) 30 tablet 0  . hydroxypropyl methylcellulose / hypromellose (ISOPTO TEARS / GONIOVISC) 2.5 % ophthalmic solution Place 1 drop into both eyes 3 (three) times daily as needed for dry eyes. (Patient not taking: Reported on 09/09/2017) 15 mL 0  . Olodaterol HCl 2.5 MCG/ACT AERS Inhale 2 puffs daily into the lungs. (Patient not taking: Reported on 09/09/2017) 4 g 2  . [DISCONTINUED] albuterol (PROVENTIL,VENTOLIN) 90 MCG/ACT inhaler Inhale 2 puffs into the lungs every 6 (six) hours as needed for wheezing. 17 g 0    Objective: BP (!) 165/114 (BP Location: Right Arm)   Pulse (!) 129   Temp 98.7 F (37.1 C) (Rectal)   Resp (!) 33   Ht 5\' 8"  (1.727 m)   Wt 92.1 kg   SpO2 100%   BMI 30.87 kg/m  Exam: General: Alert, chronically ill-appearing, in mild distress HEENT: NCAT, MM dry, oropharynx nonerythematous  Cardiac: Irregular rhythm and tachycardic rate, no m/g/r appreciated  Lungs: Clear bilaterally with poor air movement and transmitted upper airway sounds, increased WOB on 4L, speaking in small sentences Abdomen: soft, non-tender, mildly distended, hypoactive BS, large soft supraumbilical hernia. Active BM during exam, foul smelling.    Msk: Moves all extremities spontaneously  Ext: Warm, dry, 2+ distal pulses, no edema noted   Neuro: alert and oriented, appropriate speech. No focal neuro deficits, however bilateral minimal upper extremity  tremor noted. Skin: no rashes or bruising  Psych: Anxious   Labs and Imaging: CBC BMET  Recent Labs  Lab 09/27/17 1440  WBC 8.5  HGB 15.0  HCT 42.9  PLT 238   Recent Labs  Lab 09/27/17 1440  NA 128*  K 3.2*  CL 89*  CO2 21*  BUN 6*  CREATININE 0.94  GLUCOSE 134*  CALCIUM 7.9*    Dg Chest Port 1  View  Result Date: 09/27/2017 CLINICAL DATA:  Dyspnea EXAM: PORTABLE CHEST 1 VIEW COMPARISON:  09/09/2017 chest radiograph. FINDINGS: Stable cardiomediastinal silhouette with mild cardiomegaly. No pneumothorax. Portion of the left costophrenic angle is excluded from the image. No evidence of pleural effusion. Mild hazy patchy right upper parahilar lung opacity with associated bronchiectasis. No acute consolidative airspace disease. IMPRESSION: Stable bronchiectasis with associated mild patchy opacity in the right upper parahilar lung, compatible with nonspecific bronchiolitis. These findings were better depicted on recent 09/09/2017 chest CT. Electronically Signed   By: Delbert Phenix M.D.   On: 09/27/2017 15:37   Dg Abd Portable 1v  Result Date: 09/27/2017 CLINICAL DATA:  Nausea, vomiting, diarrhea, weakness, pain EXAM: PORTABLE ABDOMEN - 1 VIEW COMPARISON:  01/23/2014 CT abdomen/pelvis FINDINGS: No disproportionately dilated small bowel loops. No evidence of pneumatosis or pneumoperitoneum. No radiopaque nephrolithiasis. IMPRESSION: Nonobstructive bowel gas pattern. Electronically Signed   By: Delbert Phenix M.D.   On: 09/27/2017 15:35    Allayne Stack, DO 09/27/2017, 8:06 PM PGY-1, [facility] FPTS Intern pager: [phone], text pages welcome  I have seen and evaluated the patient with Dr. Annia Friendly and agree with her documentation as above.  I have included my edits in blue.   Freddrick March MD [facility] PGY3

## 2017-09-27 NOTE — Progress Notes (Signed)
CRITICAL VALUE ALERT  Critical Value:  Lactic  Acid 5.3  Date & Time Notied:  8/24 2100  Provider Notified:  Primitivo Gauze  Orders Received/Actions taken: 500 ml bolus; fluids increased to 150 ml/hr.

## 2017-09-27 NOTE — ED Notes (Signed)
Pt states improved sob. 

## 2017-09-27 NOTE — ED Notes (Signed)
1st set of cultures drawn a time of initial lactic

## 2017-09-27 NOTE — Progress Notes (Signed)
Pharmacy Antibiotic Note  Juan [name] is a 67 y.o. male admitted on 09/27/2017 with hief complaint of diarrhea.  Pharmacy has been consulted for vancomcyin and zosyn dosing for wound infection. SCR 0.94, CrCl ~ 86 ml/min  Plan: Zosyn 3.375 g IV q8h -4h infusion Vancomycin 1500mg  IV x1 then 1250 mg IV q12h Monitor renal function, f/u culture results, check vanc trough at steady state.   Height: 5\' 8"  (172.7 cm) Weight: 203 lb (92.1 kg) IBW/kg (Calculated) : 68.4  Temp (24hrs), Avg:98.2 F (36.8 C), Min:97.7 F (36.5 C), Max:98.7 F (37.1 C)  Recent Labs  Lab 09/27/17 1440 09/27/17 1504  WBC 8.5  --   CREATININE 0.94  --   LATICACIDVEN  --  4.82*    Estimated Creatinine Clearance: 86.3 mL/min (by C-G formula based on SCr of 0.94 mg/dL).    Allergies  Allergen Reactions  . Coreg [Carvedilol] Shortness Of Breath  . Aspartame And Phenylalanine Nausea And Vomiting    Antimicrobials this admission: vancomycin 8/24>> Zosyn 8/24>>  Dose adjustments this admission:   Microbiology results: 8/24 BCx:   Thank you for allowing pharmacy to be a part of this patient's care. Noah Delaine, RPh Clinical Pharmacist Pager: [phone] Please check AMION for all [facility] Pharm[facility] phone numbers After 10:00 PM, call Main Pharmacy [phone] 09/27/2017 7:33 PM

## 2017-09-27 NOTE — Progress Notes (Addendum)
Patient arrives to 3 east placed in low bed with suction at bedside, for CIWA precautions.   Pt is c/a/ox4, with nasal canula in place.   Pt is on enteric precautions for pending GI panel.

## 2017-09-28 ENCOUNTER — Inpatient Hospital Stay (HOSPITAL_COMMUNITY): Payer: Medicare HMO

## 2017-09-28 DIAGNOSIS — F10229 Alcohol dependence with intoxication, unspecified: Secondary | ICD-10-CM

## 2017-09-28 DIAGNOSIS — J471 Bronchiectasis with (acute) exacerbation: Secondary | ICD-10-CM

## 2017-09-28 DIAGNOSIS — R197 Diarrhea, unspecified: Secondary | ICD-10-CM

## 2017-09-28 DIAGNOSIS — I4891 Unspecified atrial fibrillation: Secondary | ICD-10-CM

## 2017-09-28 DIAGNOSIS — E860 Dehydration: Secondary | ICD-10-CM

## 2017-09-28 LAB — COMPREHENSIVE METABOLIC PANEL
ALK PHOS: 87 U/L (ref 38–126)
ALT: 29 U/L (ref 0–44)
AST: 62 U/L — ABNORMAL HIGH (ref 15–41)
Albumin: 3.3 g/dL — ABNORMAL LOW (ref 3.5–5.0)
Anion gap: 15 (ref 5–15)
BILIRUBIN TOTAL: 1.5 mg/dL — AB (ref 0.3–1.2)
CALCIUM: 7.5 mg/dL — AB (ref 8.9–10.3)
CO2: 22 mmol/L (ref 22–32)
CREATININE: 0.83 mg/dL (ref 0.61–1.24)
Chloride: 98 mmol/L (ref 98–111)
Glucose, Bld: 119 mg/dL — ABNORMAL HIGH (ref 70–99)
Potassium: 3 mmol/L — ABNORMAL LOW (ref 3.5–5.1)
Sodium: 135 mmol/L (ref 135–145)
TOTAL PROTEIN: 6.1 g/dL — AB (ref 6.5–8.1)

## 2017-09-28 LAB — BLOOD GAS, ARTERIAL
ACID-BASE EXCESS: 1.9 mmol/L (ref 0.0–2.0)
BICARBONATE: 25.9 mmol/L (ref 20.0–28.0)
Delivery systems: POSITIVE
Drawn by: 237031
EXPIRATORY PAP: 6
Inspiratory PAP: 21
O2 CONTENT: 4 L/min
O2 SAT: 96.6 %
PATIENT TEMPERATURE: 98.6
PCO2 ART: 40.2 mmHg (ref 32.0–48.0)
PO2 ART: 81.3 mmHg — AB (ref 83.0–108.0)
pH, Arterial: 7.425 (ref 7.350–7.450)

## 2017-09-28 LAB — GASTROINTESTINAL PANEL BY PCR, STOOL (REPLACES STOOL CULTURE)
Adenovirus F40/41: NOT DETECTED
Astrovirus: NOT DETECTED
CRYPTOSPORIDIUM: NOT DETECTED
Campylobacter species: NOT DETECTED
Cyclospora cayetanensis: NOT DETECTED
ENTEROAGGREGATIVE E COLI (EAEC): NOT DETECTED
Entamoeba histolytica: NOT DETECTED
Enteropathogenic E coli (EPEC): NOT DETECTED
Enterotoxigenic E coli (ETEC): NOT DETECTED
GIARDIA LAMBLIA: NOT DETECTED
Norovirus GI/GII: NOT DETECTED
PLESIMONAS SHIGELLOIDES: NOT DETECTED
ROTAVIRUS A: NOT DETECTED
SALMONELLA SPECIES: NOT DETECTED
SAPOVIRUS (I, II, IV, AND V): NOT DETECTED
SHIGA LIKE TOXIN PRODUCING E COLI (STEC): NOT DETECTED
SHIGELLA/ENTEROINVASIVE E COLI (EIEC): NOT DETECTED
Vibrio cholerae: NOT DETECTED
Vibrio species: NOT DETECTED
YERSINIA ENTEROCOLITICA: NOT DETECTED

## 2017-09-28 LAB — CBC
HCT: 41.1 % (ref 39.0–52.0)
Hemoglobin: 14.6 g/dL (ref 13.0–17.0)
MCH: 30.7 pg (ref 26.0–34.0)
MCHC: 35.5 g/dL (ref 30.0–36.0)
MCV: 86.5 fL (ref 78.0–100.0)
PLATELETS: 229 10*3/uL (ref 150–400)
RBC: 4.75 MIL/uL (ref 4.22–5.81)
RDW: 13.2 % (ref 11.5–15.5)
WBC: 12 10*3/uL — AB (ref 4.0–10.5)

## 2017-09-28 LAB — C DIFFICILE QUICK SCREEN W PCR REFLEX
C DIFFICILE (CDIFF) INTERP: NOT DETECTED
C DIFFICILE (CDIFF) TOXIN: NEGATIVE
C DIFFICLE (CDIFF) ANTIGEN: NEGATIVE

## 2017-09-28 LAB — LACTIC ACID, PLASMA
LACTIC ACID, VENOUS: 3.1 mmol/L — AB (ref 0.5–1.9)
Lactic Acid, Venous: 1.9 mmol/L (ref 0.5–1.9)

## 2017-09-28 LAB — PROTIME-INR
INR: 1.1
Prothrombin Time: 14.1 seconds (ref 11.4–15.2)

## 2017-09-28 LAB — POTASSIUM: Potassium: 3 mmol/L — ABNORMAL LOW (ref 3.5–5.1)

## 2017-09-28 MED ORDER — DIAZEPAM 5 MG PO TABS
5.0000 mg | ORAL_TABLET | Freq: Every day | ORAL | Status: DC
  Administered 2017-09-28 – 2017-10-01 (×4): 5 mg via ORAL
  Filled 2017-09-28 (×4): qty 1

## 2017-09-28 MED ORDER — DILTIAZEM HCL 25 MG/5ML IV SOLN
10.0000 mg | Freq: Once | INTRAVENOUS | Status: AC
  Administered 2017-09-28: 10 mg via INTRAVENOUS
  Filled 2017-09-28: qty 5

## 2017-09-28 MED ORDER — DILTIAZEM HCL-DEXTROSE 100-5 MG/100ML-% IV SOLN (PREMIX)
5.0000 mg/h | INTRAVENOUS | Status: DC
  Administered 2017-09-28: 15 mg/h via INTRAVENOUS
  Administered 2017-09-28: 5 mg/h via INTRAVENOUS
  Administered 2017-09-29 – 2017-09-30 (×5): 15 mg/h via INTRAVENOUS
  Administered 2017-10-01: 10 mg/h via INTRAVENOUS
  Filled 2017-09-28 (×11): qty 100

## 2017-09-28 MED ORDER — LEVALBUTEROL HCL 1.25 MG/0.5ML IN NEBU
1.2500 mg | INHALATION_SOLUTION | Freq: Three times a day (TID) | RESPIRATORY_TRACT | Status: DC
  Administered 2017-09-28 – 2017-10-01 (×9): 1.25 mg via RESPIRATORY_TRACT
  Filled 2017-09-28 (×10): qty 0.5

## 2017-09-28 MED ORDER — SODIUM CHLORIDE 0.9 % IV SOLN
INTRAVENOUS | Status: DC
  Administered 2017-09-28 – 2017-09-29 (×2): via INTRAVENOUS

## 2017-09-28 MED ORDER — NICOTINE 14 MG/24HR TD PT24
14.0000 mg | MEDICATED_PATCH | Freq: Every day | TRANSDERMAL | Status: DC
  Administered 2017-09-28 – 2017-10-03 (×6): 14 mg via TRANSDERMAL
  Filled 2017-09-28 (×7): qty 1

## 2017-09-28 MED ORDER — IPRATROPIUM BROMIDE 0.02 % IN SOLN
0.5000 mg | Freq: Four times a day (QID) | RESPIRATORY_TRACT | Status: DC
  Administered 2017-09-28: 0.5 mg via RESPIRATORY_TRACT
  Filled 2017-09-28: qty 2.5

## 2017-09-28 MED ORDER — POTASSIUM CHLORIDE CRYS ER 20 MEQ PO TBCR
40.0000 meq | EXTENDED_RELEASE_TABLET | Freq: Two times a day (BID) | ORAL | Status: AC
  Administered 2017-09-28 (×2): 40 meq via ORAL
  Filled 2017-09-28 (×2): qty 2

## 2017-09-28 MED ORDER — LORAZEPAM 2 MG/ML IJ SOLN
1.0000 mg | Freq: Once | INTRAMUSCULAR | Status: AC
  Administered 2017-09-28: 1 mg via INTRAVENOUS
  Filled 2017-09-28: qty 1

## 2017-09-28 MED ORDER — IPRATROPIUM BROMIDE 0.02 % IN SOLN
0.5000 mg | Freq: Three times a day (TID) | RESPIRATORY_TRACT | Status: DC
  Administered 2017-09-28 – 2017-10-01 (×9): 0.5 mg via RESPIRATORY_TRACT
  Filled 2017-09-28 (×10): qty 2.5

## 2017-09-28 NOTE — Progress Notes (Addendum)
Family Medicine Teaching Service Daily Progress Note Intern Pager: [phone]  Patient name: Juan [name] Medical record number: [id number] Date of birth: 15-Mar-1950 Age: 67 y.o. Gender: male  Primary Care Provider: Tobey Grim, MD Consultants: none Code Status: full  Pt Overview and Major Events to Date:  8/24 admitted to fpts  Assessment and Plan: Juan [name] is a 67 y.o. male presenting with dehydration and diarrhea x 3 weeks. PMH is significant for COPD, alcohol dependence with chronic tremor, hypertension, atrial fibrillation, HFpEF, HLD, umbilical hernia, tobacco use, depression/anxiety, and hyponatremia.   3/4 SIRS criteria Patient with tachypnea, hypertension, and WBC at 12. Initially had Lactic Acid up to 5, but has improved to 1.9 on vanc, zosyn, 2.5L total of fluids, and bipap. Unclear etiology. Has had productive cough for several weeks and has had profound diarrhea for around 3 weeks. Some bronchiectasis on chest xray but not revealing for large occult infection. GI panel and C.diff still pending. The possibility exists for the patient to have a viral illness which just pushed him over his narrow fence given his chronic disease burden. Will continue broad spectrum antibiotics for now. Can possibly narrow when a more defined etiology is revealed. The other red herring is that the patient is a chronic alcoholic. Last 3 ciwa 8->5->3. Last drink 8/24. Unlikely to be withdrawing at this time. - continue stepdown  - BiPAP as needed - follow up gi panel - trend wbc - fluids KVO - tylenol for pain control/fever - IV vanc and zosyn, pharmacy to dose - monitor ciwa  Shortness of breath Patient with increased work of breathing, although no recorded desats. Doing much better on Bipap. Unclear if having intrapulmonary infection. Anxiety is likely playing at least some role, as he did improve markedly after 1mg  ativan given during rapid response. Do not feel as though patient  is fluid overloaded. Has more wheezing on exam, and really do not hear crackles. Trace pitting edema. Will continue to treat as a pneumonia with vanc and zosyn. - BiPAP as needed - 1mg  ativan for anxiety - continue vanc and zosyn, pharmacy to dose - Continue home atrovent neb QID - Continue home budesonide nebs BID  - Wean oxygen as tolerated    Atrial Fibrillation with RVR A fib with rvr overnight with rate into 170s on 8/24. Received 20mg  IV diltizem. Per tele strip now in sinus with HR is 110-120 range since admin. Has a documented allergy to coreg. If afib again can give another dose of diltiazem. Could be tenuous since he is now normotensive. If afib while hypotensive, likely digoxin. - monitor tele - am ekg - diltiazem up to 20mg  over 5 minutes IV if normo/hypertensive - If hypotensive likely digoxin and cards consult  Watery Diarrhea Been going on for 3 weeks. No antibiotics prior to this period. GI panel and C. Diff pending. Could consider bacterial vs viral gi virus. Unlikely to be fungal or more atypical agent given lack of recent foreign travel. - follow up c diff - follow up gi panel - Monitor fluid status - KVO fluids for now given SOB  Alcohol Dependence: Drinks 10-15 oz bourbon daily. Last drink 2 oz on 8/24. Will need to monitor CIWAs closely as he is a high candidate for withdrawal. Will start long-acting benzo for two reasons. It will likely help him with his anxiety, and will help prevent withdrawal while admitted. Last 3 ciwas, 8->5->3.  - valium 5mg  daily - CIWA protocol  - Thiamine,  folate, multivitamin   Hypokalemia: K+ 3.2 on admission.  Repleted with kdur 40 mEq x1. Potassium of 3.0 this am. Will give kdur x2 8/25 when not on bipap. Daily BMP.  -daily bmp  Hyponatremia: Na 135. Chronically low in the setting of alcohol use.  -Monitor BMP   Hypertension: BP 176/107 on arrival and overnight on 8/24. Now in 120s/80s s/p 20mg  IV dilt. If becomes  hypertensive, could do home diltiazem 30mg  q 6 hours - monitor bp - dilt 30mg  q 6 hours if hypertensive  Atrial Fibrillation: HR in 110s-120s.. Rate controlled s/p IV dilt. Will follow up am ekg. - diltiazem IV vs po depending on po status - follow up am ekg  HFpEF: Stable, no signs of fluid overload on exam. Echo in 2017 showing EF 50-55%, with akinesis of basal inferior wall. Not taking anything for this at home.  - Repeat echo  HLD: Last lipid panel in 04/2017, LDL 148.  -Continue home atorvastatin   Umbilical hernia: Stable. Massive, soft supraumbilical hernia, chronically unchanged since 2011. No intervention needed at this time.    Tobacco use: Smokes 1 PPD for a long time, offered nicotine patch, pt agreeable.  -14 mg nicotine patch  Depression/anxiety: Stable. Not endorsing concerns currently.  -Cont home Venlafaxine 150mg  daily - valium 5mg  daily  FEN/GI: npo while on bipap PPx: lovenox  Disposition: pending clinical course  Subjective:  Feels like he has had minimal improvement. Asking for something to "calm his nerves" as he feels like he is very anxious about his breathing  Objective: Temp:  [97.7 F (36.5 C)-98.7 F (37.1 C)] 98.3 F (36.8 C) (08/24 2109) Pulse Rate:  [117-132] 117 (08/25 0422) Resp:  [19-36] 19 (08/25 0422) BP: (123-184)/(73-115) 125/80 (08/25 0422) SpO2:  [91 %-100 %] 91 % (08/25 0422) Weight:  [92.1 kg] 92.1 kg (08/24 1852) Physical Exam: General: alert, oriented x3. On bipap, no distress but with increased work of breathing Cardiovascular: tachycardia, no m/r/g Respiratory: Diffuse wheezing all lung bases, using accessory muscles when not on bipap, currently on bipap resting comfortably Abdomen: soft, non-tender, non-distended Extremities: 5/5 strength BUE, BLE Neuro: no focal neuro deficits, ao x3  Laboratory: Recent Labs  Lab 09/27/17 1440 09/27/17 2335  WBC 8.5 12.0*  HGB 15.0 14.6  HCT 42.9 41.1  PLT 238 229    Recent Labs  Lab 09/27/17 1440 09/27/17 2335 09/28/17 0641  NA 128* 135  --   K 3.2* 3.0* 3.0*  CL 89* 98  --   CO2 21* 22  --   BUN 6* <5*  --   CREATININE 0.94 0.83  --   CALCIUM 7.9* 7.5*  --   PROT 6.5 6.1*  --   BILITOT 1.3* 1.5*  --   ALKPHOS 94 87  --   ALT 31 29  --   AST 63* 62*  --   GLUCOSE 134* 119*  --     Imaging/Diagnostic Tests: CLINICAL DATA:  Shortness of breath, progressive.  EXAM: CHEST  1 VIEW  COMPARISON:  Radiograph earlier this day at 1515 hour. Chest CT 07/12/2017  FINDINGS: Unchanged cardiomediastinal contours with stable mild cardiomegaly. Vague patchy opacity in the right suprahilar lung corresponding to bronchiectasis on prior CT. No new focal airspace disease, pleural effusion or pneumothorax. Unchanged osseous structures.  IMPRESSION: Unchanged appearance of the chest. Vague right perihilar opacity corresponding to bronchiectasis on recent CT.  Electronically Signed   By: Rubye Oaks M.D.   On: 09/27/2017 23:52  Myrene Buddy, MD 09/28/2017, 8:43 AM PGY-2,  Family Medicine FPTS Intern pager: [phone], text pages welcome

## 2017-09-28 NOTE — Significant Event (Addendum)
Rapid Response Event Note  Overview: Respiratory Distress  Initial Focused Assessment: Called by charge RN about patient having respiratory distress. Patient was just transferred from telemetry to stepdown for closer monitoring. Upon arrival, patient was moderate respiratory distress, RR in the 40s, + use of accessory muscles, + SOB/WOB, skin warm and flushed, afebrile, SBP was initially in the 170s, HR was in the 130-170s - irregular, saturations were 96% on RA. BIPAP was ordered already. Lung sounds - basilar crackles but overall poor air movement, lung sounds were very diminished bilaterally. Patient was anxious as well, on CIWA as well.   Interventions: - Stopped fluid bolus that was infusing along with MIVF (Patient was getting 500 cc NS and 150cc/hr NS). - STAT EKG - AF RVR - STAT LABS - CBC/CMP/LA/PT/INR - STAT CXR x 1 - FMTS MDs came to bedside - Diltiazem 20 mg IV x 1 - Lasix 20 mg IV x 1 - Lorazepam 1 mg x 1  Plan of Care: - HR improved to 110-115, appears regular ST. WOB improved, RR was now in the low 20s, 98% on BIPAP 21/7, SBP in the 130s, patient resting comfortably. - Follow up with labs - Monitor UOP - Patient to remain on BIPAP until diuresis and respiratory status maintains.  - Continue IV ABX as ordered but no MIVF - KVO 10cc/hr  Event Summary:   at    Call Time 2301 Arrival Time 2303 End Time 0040  Juan [name] R

## 2017-09-28 NOTE — Progress Notes (Addendum)
Family Medicine Progress Update  Evaluated patient along with rapid response nurse. Patient had EKG which showed afib with RVR. Patient still having difficulty with work of breathing despite BiPap of 17/6. Gave one dose of ativan 1mg  as patient's anxiety felt to be adversely affecting his breathing. HR up into 170s. BP 150/90s. Does have h/o afib with rvr. Takes diltiazem at home, he had not received this medication since admission. Patient has a documented allergy to carvedilol and there is some aspect of cross-reactivity. Calculated patient's IV dilt dose as ~22mg  IV push by 0.25 x BW. Over the course of approximately 10 minutes the patient received a total of 20mg  IV diltiazem. HR down into 110s and Bp 130/80s. Patient's work of breathing did improve over the time period.   Chest xray reviewed. No findings consistent with pulmonary vascular congestion. Corresponding to known bronchiectasis in right lung. CBC reviewed, WBC increased from 8.5 to 12. CMP reviewed, K 3.0. Will replete with oral potassium when off of bipap. Do not feel that giving IV with fluids is appropriate given his lasix dose.  Will continue on bipap for now. Continue vanc, zosyn. Monitor HR closely. Replete k orally when able. KVO fluids for now.  Myrene Buddy MD PGY-2 Family Medicine Resident

## 2017-09-28 NOTE — Progress Notes (Signed)
Patient received from 3 east. Patient is alert and oriented. Upon arrival patient was on respiratory distress with labored respiration over 35, heart rate 130, Lung sounds BL crackles and profuse sweating. Notified the on call MD, rapid response and RT. On call MD, rapid response and RTcame  and see the patient bedside and done blood work, EKG, and chest x ray. Patient given medicines - diltiazem 20 mg iv, ativan 1 mg  Iv,and lasix 20 mg iv as per order. Patient is on BIPAP.

## 2017-09-28 NOTE — Progress Notes (Signed)
Patient transferred from tele to stepdown overnight for closer monitoring.  Evaluated him at bedside this afternoon.  He is currently on Bipap. Per patient, he had been on RA for about an hour satting 96%.  Encouraged use of bipap as he has mild respiratory distress when off this with use of accessory muscles.  Patient reports feeling less anxious with medications administered earlier this morning.  Standing valium 5 mg daily with ativan per CIWA protocol ordered. CIWA scores have been 8 and 5.  Exam notable for diffuse wheezing and use of accessory muscles when bipap is removed.    HR in 115-120 range, have placed him on a diltiazem drip, gtt to be titrated by nursing per parameters included.  Will await echo.   Will FYI CCM about him, concerned with work of breathing that he may tire out on bipap and require additional intervention.     Juan March MD [facility] PGY3

## 2017-09-28 NOTE — Progress Notes (Signed)
CRITICAL VALUE ALERT  Critical Value: lactic acid 3.1  Date & Time Notied:  09/28/17 at 0100  Provider Notified: un call family medicine  Orders Received/Actions taken: no active order.

## 2017-09-28 NOTE — Progress Notes (Signed)
FPTS Interim Progress Note  S: PM check to see how patient is doing. He is resting comfortably in bed. He is not complaining of dypsnea at this time. His vitals are stable and he is well-appearing on room air. He is alert and appropriate. Respiratory is at bedside, planning to put on bipap for comfort overnight.  O: BP 125/80   Pulse (!) 116   Temp 98.3 F (36.8 C) (Oral)   Resp (!) 27   Ht 5\' 8"  (1.727 m)   Wt 92.1 kg   SpO2 96%   BMI 30.87 kg/m   GEN: comfortable, appropriate, nontoxic CARD: RRR, nom/r/g PULM: CTA, comfortable work of breathing, speaking in full sentences  A/P: Will continue to monitor. Plan per daily progress note.  Howard Pouch, MD 09/28/2017, 9:18 PM PGY-3, [facility] pager [phone]

## 2017-09-28 NOTE — Progress Notes (Signed)
Pt. Refused cpap for tonight. 

## 2017-09-29 ENCOUNTER — Other Ambulatory Visit (HOSPITAL_COMMUNITY): Payer: Self-pay

## 2017-09-29 LAB — RETICULOCYTES
RBC.: 4.4 MIL/uL (ref 4.22–5.81)
RETIC CT PCT: 1.7 % (ref 0.4–3.1)
Retic Count, Absolute: 74.8 10*3/uL (ref 19.0–186.0)

## 2017-09-29 LAB — CBC WITH DIFFERENTIAL/PLATELET
Abs Immature Granulocytes: 0.1 10*3/uL (ref 0.0–0.1)
BASOS ABS: 0.1 10*3/uL (ref 0.0–0.1)
Basophils Relative: 1 %
EOS PCT: 6 %
Eosinophils Absolute: 0.5 10*3/uL (ref 0.0–0.7)
HCT: 37 % — ABNORMAL LOW (ref 39.0–52.0)
HEMOGLOBIN: 12.7 g/dL — AB (ref 13.0–17.0)
Immature Granulocytes: 1 %
LYMPHS PCT: 16 %
Lymphs Abs: 1.4 10*3/uL (ref 0.7–4.0)
MCH: 30.5 pg (ref 26.0–34.0)
MCHC: 34.3 g/dL (ref 30.0–36.0)
MCV: 88.7 fL (ref 78.0–100.0)
Monocytes Absolute: 1 10*3/uL (ref 0.1–1.0)
Monocytes Relative: 11 %
NEUTROS ABS: 5.5 10*3/uL (ref 1.7–7.7)
Neutrophils Relative %: 65 %
PLATELETS: 174 10*3/uL (ref 150–400)
RBC: 4.17 MIL/uL — ABNORMAL LOW (ref 4.22–5.81)
RDW: 13.9 % (ref 11.5–15.5)
WBC: 8.4 10*3/uL (ref 4.0–10.5)

## 2017-09-29 LAB — COMPREHENSIVE METABOLIC PANEL
ALK PHOS: 77 U/L (ref 38–126)
ALT: 24 U/L (ref 0–44)
ANION GAP: 9 (ref 5–15)
AST: 55 U/L — ABNORMAL HIGH (ref 15–41)
Albumin: 3 g/dL — ABNORMAL LOW (ref 3.5–5.0)
BUN: <5 mg/dL — ABNORMAL LOW (ref 8–23)
CALCIUM: 7.3 mg/dL — AB (ref 8.9–10.3)
CHLORIDE: 99 mmol/L (ref 98–111)
CO2: 25 mmol/L (ref 22–32)
Creatinine, Ser: 0.94 mg/dL (ref 0.61–1.24)
GFR calc non Af Amer: >60 mL/min (ref >60)
GLUCOSE: 133 mg/dL — AB (ref 70–99)
POTASSIUM: 2.8 mmol/L — AB (ref 3.5–5.1)
SODIUM: 133 mmol/L — AB (ref 135–145)
Total Bilirubin: 1.3 mg/dL — ABNORMAL HIGH (ref 0.3–1.2)
Total Protein: 5.8 g/dL — ABNORMAL LOW (ref 6.5–8.1)

## 2017-09-29 LAB — VITAMIN B12: Vitamin B-12: 596 pg/mL (ref 180–914)

## 2017-09-29 LAB — FERRITIN: Ferritin: 567 ng/mL — ABNORMAL HIGH (ref 24–336)

## 2017-09-29 LAB — PROCALCITONIN: Procalcitonin: <0.1 ng/mL

## 2017-09-29 LAB — IRON AND TIBC
IRON: 74 ug/dL (ref 45–182)
Saturation Ratios: 31 % (ref 17.9–39.5)
TIBC: 237 ug/dL — AB (ref 250–450)
UIBC: 163 ug/dL

## 2017-09-29 LAB — FOLATE: Folate: 24.6 ng/mL (ref >5.9)

## 2017-09-29 LAB — MAGNESIUM: Magnesium: 1 mg/dL — ABNORMAL LOW (ref 1.7–2.4)

## 2017-09-29 LAB — MRSA PCR SCREENING: MRSA by PCR: NEGATIVE

## 2017-09-29 MED ORDER — SODIUM CHLORIDE 0.9 % IV SOLN
1.0000 g | Freq: Once | INTRAVENOUS | Status: AC
  Administered 2017-09-29: 1 g via INTRAVENOUS
  Filled 2017-09-29: qty 10

## 2017-09-29 MED ORDER — POTASSIUM CHLORIDE CRYS ER 20 MEQ PO TBCR
40.0000 meq | EXTENDED_RELEASE_TABLET | Freq: Two times a day (BID) | ORAL | Status: AC
  Administered 2017-09-29 (×2): 40 meq via ORAL
  Filled 2017-09-29 (×2): qty 2

## 2017-09-29 MED ORDER — CALCIUM CITRATE 950 (200 CA) MG PO TABS
400.0000 mg | ORAL_TABLET | Freq: Every day | ORAL | Status: DC
  Administered 2017-09-30 – 2017-10-03 (×4): 400 mg via ORAL
  Filled 2017-09-29 (×4): qty 2

## 2017-09-29 MED ORDER — CALCIUM CARBONATE ANTACID 500 MG PO CHEW: 2.0000 | CHEWABLE_TABLET | Freq: Every day | ORAL | Status: DC

## 2017-09-29 MED ORDER — ORAL CARE MOUTH RINSE
15.0000 mL | Freq: Two times a day (BID) | OROMUCOSAL | Status: DC
  Administered 2017-09-29 – 2017-10-02 (×7): 15 mL via OROMUCOSAL

## 2017-09-29 MED ORDER — MAGNESIUM SULFATE 2 GM/50ML IV SOLN
2.0000 g | Freq: Once | INTRAVENOUS | Status: AC
  Administered 2017-09-29: 2 g via INTRAVENOUS
  Filled 2017-09-29: qty 50

## 2017-09-29 NOTE — Progress Notes (Signed)
Spoke with pulmonology, they state it sounds like CAP and recommend transitioning to PO Abx for 7 day course.  Doubt bronchiectasis as patient is not coughing up mucus.  Recommend a chest CT if does not improve by mid-week and reach out to pulm again.  Luis Abed, D.O.  PGY-1 Family Medicine  09/29/2017 4:32 PM

## 2017-09-29 NOTE — Progress Notes (Signed)
Patient able to place himself on CPAP when he is ready. No assistance is needed.

## 2017-09-29 NOTE — Progress Notes (Signed)
Family Medicine Teaching Service Daily Progress Note Intern Pager: [phone]  Patient name: Juan [name] Medical record number: [id number] Date of birth: [birth year] Age: 67 y.o. Gender: male  Primary Care Provider: Tobey Grim, MD Consultants: none Code Status: full  Pt Overview and Major Events to Date:  8/24 admitted to fpts  Assessment and Plan: Juan [name] is a 67 y.o. male presenting with dehydration and diarrhea x 3 weeks. PMH is significant for COPD, alcohol dependence with chronic tremor, hypertension, atrial fibrillation, HFpEF, HLD, umbilical hernia, tobacco use, depression/anxiety, and hyponatremia.   3/4 SIRS criteria Tachypnic, hypertensive, and WBC 12 on admission. Treating broad spectrum Abx at this time. GI panel and C diff negative.  Continues to be tachypnic, 22 this AM, on 2L per [place].  WBC 8.4 this AM. Remains afebrile.  This AM vitals stable, patient states he is feeling well. Had some breathing difficulty around 0530 which he attributed to anxiety. - continue stepdown care - BiPAP as needed - trend wbc - fluids KVO - tylenol for pain control/fever - IV vanc and zosyn, pharmacy to dose - monitor ciwa  Shortness of breath Treating as pneumonia with anxiety component given improvement with ativan during rapid response.  This AM O2 sats WNL, no increased WOB, on 2L per .  - will consult pulm as CXR not obviously suggestive for pnuemonia, can be mucus plugging  - BiPAP as needed - continue vanc and zosyn, pharmacy to dose - Continue home atrovent neb QID - Continue home budesonide nebs BID  - Wean oxygen as tolerated    Atrial Fibrillation with RVR HR 95-120 overnight. Rate controlled s/p IV dilt. EKG on 8/25 sinus tachycardia with RBBB. On dilt gtt - monitor tele - cont dilt gtt - diltiazem up to 20mg  over 5 minutes IV if normo/hypertensive - If hypotensive likely digoxin and cards consult  Watery Diarrhea Negative C diff, negative GI panel.   This AM denies diarrhea. - Monitor fluid status - KVO fluids for now given SOB  Alcohol Dependence: Chronic Last CIWAs 0>0>8. Ativan 1mg  this AM. - valium 5mg  daily - CIWA protocol  - Thiamine, folate, multivitamin   Hypokalemia: Acute K 2.8 this AM, was 3.0 yesterday and received K-Dur x2. Mag ordered this AM, came back at 1.0. - x2 today for repletion - replete Mag IV 2g -daily bmp  Hypocalcemia: Acute 7.9 on admission, was 7.3 this AM. Correction with albumin (3.0), Ca is 8.1. - order ionized calcium - spoke with pharmacy for calcium repletion recs - Calcium gluconate IV 1g now - Start daily calcium carbonate repletion - order vitamin D - f/u BMP in AM  Hyponatremia: Chronic  Na 133 this AM. Chronically low in the setting of alcohol use.  Holding off on fluids in the setting of CHF. -Monitor BMP   Anemia: Acute Hgb 12.7 this AM, down from 14.6. - Anemia panel - FOBT - cont to monitor CBC  Hypertension: Chronic BP overnight, well controlled.  154/91 this AM. - cont monitor bp - dilt 30mg  q 6 hours if hypertensive  HFpEF: Stable  No signs of fluid overload on exam. Echo in 2017 showing EF 50-55%, with akinesis of basal inferior wall. Not taking anything for this at home.  - Repeat echo ordered  HLD: Chronic Last lipid panel in 04/2017, LDL 148.  -Continue home atorvastatin   Umbilical hernia: Stable Massive, soft supraumbilical hernia, chronically unchanged since 2011. No intervention needed at this time.  Tobacco use: Chronic Smokes 1 PPD for a long time. -14 mg nicotine patch  Depression/anxiety: Stable.  Not endorsing concerns currently.  -Cont home Venlafaxine 150mg  daily - valium 5mg  daily  FEN/GI: npo while on bipap PPx: lovenox  Disposition: pending clinical course  Subjective:  Patient notes some anxiety this AM at 0530.  He states that he did not use his BiPAP overnight as he felt as though he was getting "too much  air." Had no further complaints this AM.  Objective: Temp:  [99.8 F (37.7 C)] 99.8 F (37.7 C) (08/26 1200) Pulse Rate:  [91-108] 91 (08/26 1200) Resp:  [19-25] 21 (08/26 1200) BP: (129-154)/(88-91) 148/89 (08/26 0823) SpO2:  [95 %-100 %] 95 % (08/26 1200)  Physical Exam: General: 67 y.o. male in NAD, On 2L per [place],  Cardio: RRR no m/r/g Lungs: Minimal wheezing, no rhonchi, no crackles, no increased work of breathing  Abdomen: Soft, non-tender to palpation, positive bowel sounds Skin: warm and dry Extremities: No edema   Laboratory: Recent Labs  Lab 09/27/17 1440 09/27/17 2335 09/29/17 0451  WBC 8.5 12.0* 8.4  HGB 15.0 14.6 12.7*  HCT 42.9 41.1 37.0*  PLT 238 229 174   Recent Labs  Lab 09/27/17 1440 09/27/17 2335 09/28/17 0641 09/29/17 0451  NA 128* 135  --  133*  K 3.2* 3.0* 3.0* 2.8*  CL 89* 98  --  99  CO2 21* 22  --  25  BUN 6* <5*  --  <5*  CREATININE 0.94 0.83  --  0.94  CALCIUM 7.9* 7.5*  --  7.3*  PROT 6.5 6.1*  --  5.8*  BILITOT 1.3* 1.5*  --  1.3*  ALKPHOS 94 87  --  77  ALT 31 29  --  24  AST 63* 62*  --  55*  GLUCOSE 134* 119*  --  133*    Imaging/Diagnostic Tests: CLINICAL DATA:  Shortness of breath, progressive.  EXAM: CHEST  1 VIEW  COMPARISON:  Radiograph earlier this day at 1515 hour. Chest CT 07/12/2017  FINDINGS: Unchanged cardiomediastinal contours with stable mild cardiomegaly. Vague patchy opacity in the right suprahilar lung corresponding to bronchiectasis on prior CT. No new focal airspace disease, pleural effusion or pneumothorax. Unchanged osseous structures.  IMPRESSION: Unchanged appearance of the chest. Vague right perihilar opacity corresponding to bronchiectasis on recent CT.  Electronically Signed   By: Rubye Oaks M.D.   On: 09/27/2017 23:52  Meccariello, Solmon Ice, DO 09/29/2017, 2:12 PM PGY-1, [facility] Family Medicine FPTS Intern pager: [phone], text pages welcome

## 2017-09-30 ENCOUNTER — Inpatient Hospital Stay (HOSPITAL_COMMUNITY): Payer: Medicare HMO

## 2017-09-30 DIAGNOSIS — I5030 Unspecified diastolic (congestive) heart failure: Secondary | ICD-10-CM

## 2017-09-30 LAB — CBC
HCT: 34.6 % — ABNORMAL LOW (ref 39.0–52.0)
Hemoglobin: 12 g/dL — ABNORMAL LOW (ref 13.0–17.0)
MCH: 31.1 pg (ref 26.0–34.0)
MCHC: 34.7 g/dL (ref 30.0–36.0)
MCV: 89.6 fL (ref 78.0–100.0)
Platelets: 149 10*3/uL — ABNORMAL LOW (ref 150–400)
RBC: 3.86 MIL/uL — ABNORMAL LOW (ref 4.22–5.81)
RDW: 14.2 % (ref 11.5–15.5)
WBC: 9.7 10*3/uL (ref 4.0–10.5)

## 2017-09-30 LAB — ECHOCARDIOGRAM COMPLETE
Height: 68 in
Weight: 3248 oz

## 2017-09-30 LAB — MAGNESIUM: Magnesium: 1.5 mg/dL — ABNORMAL LOW (ref 1.7–2.4)

## 2017-09-30 LAB — BASIC METABOLIC PANEL
Anion gap: 8 (ref 5–15)
BUN: 5 mg/dL — AB (ref 8–23)
CO2: 27 mmol/L (ref 22–32)
Calcium: 8 mg/dL — ABNORMAL LOW (ref 8.9–10.3)
Chloride: 100 mmol/L (ref 98–111)
Creatinine, Ser: 1.08 mg/dL (ref 0.61–1.24)
GFR calc Af Amer: >60 mL/min (ref >60)
GLUCOSE: 137 mg/dL — AB (ref 70–99)
POTASSIUM: 3.1 mmol/L — AB (ref 3.5–5.1)
Sodium: 135 mmol/L (ref 135–145)

## 2017-09-30 LAB — PROCALCITONIN

## 2017-09-30 LAB — VITAMIN D 25 HYDROXY (VIT D DEFICIENCY, FRACTURES): Vit D, 25-Hydroxy: 12.6 ng/mL — ABNORMAL LOW (ref 30.0–100.0)

## 2017-09-30 LAB — CALCIUM, IONIZED: Calcium, Ionized, Serum: 4.2 mg/dL — ABNORMAL LOW (ref 4.5–5.6)

## 2017-09-30 MED ORDER — MAGNESIUM OXIDE 400 (241.3 MG) MG PO TABS
800.0000 mg | ORAL_TABLET | Freq: Once | ORAL | Status: AC
  Administered 2017-09-30: 800 mg via ORAL
  Filled 2017-09-30: qty 2

## 2017-09-30 MED ORDER — POTASSIUM CHLORIDE CRYS ER 20 MEQ PO TBCR
40.0000 meq | EXTENDED_RELEASE_TABLET | Freq: Two times a day (BID) | ORAL | Status: AC
  Administered 2017-09-30 (×2): 40 meq via ORAL
  Filled 2017-09-30 (×2): qty 2

## 2017-09-30 MED ORDER — LORAZEPAM 2 MG/ML IJ SOLN
1.0000 mg | Freq: Once | INTRAMUSCULAR | Status: AC
  Administered 2017-09-30: 1 mg via INTRAVENOUS
  Filled 2017-09-30: qty 1

## 2017-09-30 MED ORDER — IPRATROPIUM-ALBUTEROL 0.5-2.5 (3) MG/3ML IN SOLN
3.0000 mL | Freq: Once | RESPIRATORY_TRACT | Status: AC
  Administered 2017-09-30: 3 mL via RESPIRATORY_TRACT

## 2017-09-30 MED ORDER — LEVOFLOXACIN 500 MG PO TABS
500.0000 mg | ORAL_TABLET | Freq: Every day | ORAL | Status: DC
  Administered 2017-09-30 – 2017-10-03 (×4): 500 mg via ORAL
  Filled 2017-09-30 (×4): qty 1

## 2017-09-30 MED ORDER — ALBUTEROL SULFATE (2.5 MG/3ML) 0.083% IN NEBU
INHALATION_SOLUTION | RESPIRATORY_TRACT | Status: AC
  Filled 2017-09-30: qty 3

## 2017-09-30 MED ORDER — IPRATROPIUM-ALBUTEROL 0.5-2.5 (3) MG/3ML IN SOLN
RESPIRATORY_TRACT | Status: AC
  Administered 2017-09-30: 03:00:00
  Filled 2017-09-30: qty 3

## 2017-09-30 MED ORDER — VITAMIN D 1000 UNITS PO TABS
1000.0000 [IU] | ORAL_TABLET | Freq: Every day | ORAL | Status: DC
  Administered 2017-09-30 – 2017-10-03 (×4): 1000 [IU] via ORAL
  Filled 2017-09-30 (×4): qty 1

## 2017-09-30 NOTE — Progress Notes (Signed)
Family Medicine Teaching Service Daily Progress Note Intern Pager: [phone]  Patient name: Juan [name] Medical record number: [id number] Date of birth: [birth year] Age: 67 y.o. Gender: male  Primary Care Provider: Tobey Grim, MD Consultants: none Code Status: full  Pt Overview and Major Events to Date:  8/24 admitted to fpts  Assessment and Plan: Juan [name] is a 67 y.o. male presenting with dehydration and diarrhea x 3 weeks. PMH is significant for COPD, alcohol dependence with chronic tremor, hypertension, atrial fibrillation, HFpEF, HLD, umbilical hernia, tobacco use, depression/anxiety, and hyponatremia.   3/4 SIRS criteria Transitioned to PO Levaquin for 7 day course yesterday.  Overnight mildly tachypnic, otherwise VSS.  BiPAP was used for about 2 hours last PM. This AM sating at 94% on RA while examining (not in chart). WBC 10.0 this AM.   - continue stepdown care - BiPAP as needed - trend wbc - fluids KVO - tylenol for pain control/fever - Levaquin PO x 7 days (8/27- )  Shortness of breath Pulm recommends if no improvement by today, get CT chest and reach out to them again. Overnight mildly tachypnic, otherwise VSS. BiPAP was used for about 2 hours last PM. This AM notes that breathing is fine. Sating well on RA. Lungs clear on exam.   - cont Levaquin PO x 7 days - BiPAP as needed - Increase home atrovent neb to QID - Increase xopenex nebs to QID - Continue home budesonide nebs BID  - Wean oxygen as tolerated   - no need for CT chest at this time due to improvement in breathing  Atrial Fibrillation with RVR On Dilt gtt, titrated to 10 yesterday.  Rate overnight well-controlled in 90s.  On dilt gtt at 10mg  overnight. Titrated to 5mg  this AM.  - monitor tele - d/c digoxin as in contraindicated in setting of reduced EF - start Coreg 12.5 BID, "allergy" in chart listed as SOB, not true allergy associated with Coreg - consult cardiology  HFrEF: Stable No  signs of fluid overload on exam.  Echo EF 30-35%,  G1DD.   - cardiology consult for worsened EF -d/c diltiazem  Watery Diarrhea: Resolved Denies diarrhea. - Monitor fluid status - cont KVO fluids  Alcohol Dependence: Chronic CIWAs 7>2.  Received 1mg  of ativan last night.  PRN ativan order has fallen off.   - valium 5mg  daily - CIWA protocol  - Thiamine, folate, multivitamin  - consider starting naltrexone prior to discharge  Depression/anxiety: Stable.  Patient requesting his home klonopin and states that he was very anxious last night.  Noted breathing was fine, no orthopnea, no tremors, "just felt a little anxious."  Was given 1mg  ativan for CIWA 7 last pm.  -Cont home Venlafaxine 150mg  daily - d/c valium 5mg   - will restart home klonopin 0.5mg  BID  Hypokalemia, Hypomagnesemia: Improving K this AM 3.5, up from 3.1 yesterday. Mag this AM 1.5 unchanged from yesterday.  Ordered Mag 2g IV this AM.  Received total of andoral mag oxide 800mg  yesterday as well.     - Mag repletion ordered - daily bmp - recheck Mg in AM  Hypocalcemia, Vitamin D Deficiency: Improving Ca this AM 8.5, up from 8.0 yesterday. - f/u ionized calcium, in process - cont daily calcium carbonate repletion - cont Vitamin D 1000 QD - will need repeat Vit D in 1 month as outpatient - f/u BMP in AM  Hyponatremia: Improving Na this AM 134. Chronically low in the setting of alcohol use.  Holding off on fluids in the setting of CHF. -Monitor BMP   Anemia: Improving Hgb improved to 12.7.  Likely anemia of chronic disease from iron panel results. - cont to monitor CBC  Hypertension: Chronic BP overnight well-controlled.  BP this AM 131/83. - cont monitor bp - starting coreg 12.5mg  BID  HLD: Chronic Last lipid panel in 04/2017, LDL 148.  -Continue home atorvastatin   Umbilical hernia: Stable Massive, soft supraumbilical hernia, chronically unchanged since 2011. No intervention needed at this  time.    Tobacco use: Chronic Smokes 1 PPD for a long time. -14 mg nicotine patch   FEN/GI: Regular PPx: lovenox  Disposition: pending clinical improvement  Subjective:  Patient notes some anxiety overnight.  No chest pain.  Denies shortness of breath this AM.  Objective: Temp:  [98.1 F (36.7 C)-98.5 F (36.9 C)] 98.3 F (36.8 C) (08/28 0359) Pulse Rate:  [87-97] 95 (08/28 0440) Resp:  [17-29] 25 (08/28 0440) BP: (120-156)/(78-114) 131/83 (08/28 0440) SpO2:  [90 %-99 %] 91 % (08/28 0440)  Physical Exam: General: 67 y.o. male in NAD Cardio: RRR no m/r/g Lungs: CTAB, no wheezing, no rhonchi, no crackles, no increased work of breathing, on RA Abdomen: Soft, non-tender to palpation, positive bowel sounds Skin: warm and dry Extremities: No edema    Laboratory: Recent Labs  Lab 09/29/17 0451 09/30/17 0518 10/01/17 0335  WBC 8.4 9.7 10.0  HGB 12.7* 12.0* 12.7*  HCT 37.0* 34.6* 37.4*  PLT 174 149* 161   Recent Labs  Lab 09/27/17 1440 09/27/17 2335  09/29/17 0451 09/30/17 0518 10/01/17 0335  NA 128* 135  --  133* 135 134*  K 3.2* 3.0*   < > 2.8* 3.1* 3.5  CL 89* 98  --  99 100 94*  CO2 21* 22  --  25 27 30   BUN 6* <5*  --  <5* 5* 6*  CREATININE 0.94 0.83  --  0.94 1.08 1.21  CALCIUM 7.9* 7.5*  --  7.3* 8.0* 8.5*  PROT 6.5 6.1*  --  5.8*  --   --   BILITOT 1.3* 1.5*  --  1.3*  --   --   ALKPHOS 94 87  --  77  --   --   ALT 31 29  --  24  --   --   AST 63* 62*  --  55*  --   --   GLUCOSE 134* 119*  --  133* 137* 141*   < > = values in this interval not displayed.    Imaging/Diagnostic Tests: CLINICAL DATA:  Shortness of breath, progressive.  EXAM: CHEST  1 VIEW  COMPARISON:  Radiograph earlier this day at 1515 hour. Chest CT 07/12/2017  FINDINGS: Unchanged cardiomediastinal contours with stable mild cardiomegaly. Vague patchy opacity in the right suprahilar lung corresponding to bronchiectasis on prior CT. No new focal airspace disease,  pleural effusion or pneumothorax. Unchanged osseous structures.  IMPRESSION: Unchanged appearance of the chest. Vague right perihilar opacity corresponding to bronchiectasis on recent CT.  Electronically Signed   By: Rubye Oaks M.D.   On: 09/27/2017 23:52  Meccariello, Solmon Ice, DO 10/01/2017, 8:01 AM PGY-1, [facility] FPTS Intern pager: [phone], text pages welcome

## 2017-09-30 NOTE — Progress Notes (Addendum)
Family Medicine Teaching Service Daily Progress Note Intern Pager: [phone]  Patient name: Juan [name] Medical record number: [id number] Date of birth: 07/14/1950 Age: 67 y.o. Gender: male  Primary Care Provider: Tobey Grim, MD Consultants: none Code Status: full  Pt Overview and Major Events to Date:  8/24 admitted to fpts  Assessment and Plan: Juan [name] is a 67 y.o. male presenting with dehydration and diarrhea x 3 weeks. PMH is significant for COPD, alcohol dependence with chronic tremor, hypertension, atrial fibrillation, HFpEF, HLD, umbilical hernia, tobacco use, depression/anxiety, and hyponatremia.   3/4 SIRS criteria Treating with broad spectrum Abx.  Pulm recommends transitioning to PO Abx for 7 day course for treatment of CAP per phone curbside yesterday.  Overnight patient tachypnic to 30, not on BiPAP at the time. BP well-controlled overnight, afebrile.  Telemetry shows 6 beat run of non-sustained V Tach at 0418, otherwise A Fib. WBC this AM 9.7.  Procalcitonin negative. - continue stepdown care - BiPAP as needed - trend wbc - fluids KVO - tylenol for pain control/fever - d/c IV vanc and zosyn - Levaquin PO x 7 days  Shortness of breath Pulm recommends treating as CAP, transition to PO Abx for 7 day course.  They recommend if no improvement by Wednesday, get CT chest and reach out to them again. Received duonebs last PM for shortness of breath.  Patient not using BiPAP as he states it is not working well.  Sating appropriately on 2L per [place], patient does not have home O2 requirement.  Notes that cough was productive with clear sputum yesterday and that his breathing has improved since then. - d/c IV vanc and zosyn - start Levaquin PO x 7 days - BiPAP as needed - Continue home atrovent neb TID - Continue home budesonide nebs BID  - Wean oxygen as tolerated    Atrial Fibrillation with RVR On Dilt gtt.  Rate controlled overnight, 6 beat run of  non-sustained V tach, otherwise in A Fib. EKG on 8/25 sinus tachycardia with RBBB.  - monitor tele - cont dilt gtt titration - will transition to po diltiazem as able - diltiazem up to 20mg  over 5 minutes IV if normo/hypertensive - If hypotensive likely digoxin and cards consult  Watery Diarrhea Negative C diff, negative GI panel.  Notes soft stool yesterday, no diarrhea. - Monitor fluid status - cont KVO fluids for now given SOB  Alcohol Dependence: Chronic CIWAs 11>3>3. 1mg  ativan 1752 yesterday when CIWA 11. - valium 5mg  daily - CIWA protocol  - Thiamine, folate, multivitamin   Hypokalemia, Hypomagnesemia: Improving K this AM 3.1 up from 2.3 yesterday. Mag this AM 1.5, up from 1.0.  Received total of and Mag 2g IV.   - x2 today for repletion - oral Magnesium Oxide 800mg  once - daily bmp - recheck Mg in AM  Hypocalcemia, Vitamin D Deficiency: Improving S/P Calcium gluconate 1g IV yesterday.  Ca corrected today is 8.8. Vitamin D 12.6. - f/u ionized calcium, in process - cont daily calcium carbonate repletion - start Vitamin D 1000 QD - f/u BMP in AM  Hyponatremia: Improving Na 135 this AM. Chronically low in the setting of alcohol use.  Holding off on fluids in the setting of CHF. -Monitor BMP   Anemia: Acute Hgb trending down 14.6>12.7>12.0.  MCV 89.6.  Anemia panel, low TIBC and high ferritin, normal Vit B12, suggestive of Anemia of Chronic Disease - FOBT pending - cont to monitor CBC  Hypertension: Chronic  BP overnight well-controlled.  148/92 this AM. - cont monitor bp - dilt 30mg  q 6 hours if hypertensive  HFpEF: Stable No signs of fluid overload on exam. Echo in 2017 showing EF 50-55%, with akinesis of basal inferior wall. Not taking anything for this at home.  - Repeat echo ordered, scheduled  HLD: Chronic Last lipid panel in 04/2017, LDL 148.  -Continue home atorvastatin   Umbilical hernia: Stable Massive, soft supraumbilical hernia,  chronically unchanged since 2011. No intervention needed at this time.    Tobacco use: Chronic Smokes 1 PPD for a long time. -14 mg nicotine patch  Depression/anxiety: Stable.  Admits to occasional feelings of anxiety. -Cont home Venlafaxine 150mg  daily - valium 5mg  daily  FEN/GI: Regular PPx: lovenox  Disposition: pending clinical improvement  Subjective:  Patient notes that he is feeling well today and that his breathing has improved since yesterday.  Denies chest pain, denies diarrhea.  Notes anxiety off and on.  Objective: Temp:  [98.6 F (37 C)-99.8 F (37.7 C)] 98.6 F (37 C) (08/26 2000) Pulse Rate:  [87-94] 90 (08/27 0535) Resp:  [17-30] 17 (08/27 0535) BP: (122-157)/(76-100) 148/92 (08/27 0535) SpO2:  [89 %-100 %] 91 % (08/27 0535)  Physical Exam: General: 67 y.o. male in NAD Cardio: RRR no m/r/g Lungs: CTAB, no wheezing, no rhonchi, no crackles, no increased work of breathing Abdomen: Soft, non-tender to palpation, positive bowel sounds Skin: warm and dry Extremities: No edema   Laboratory: Recent Labs  Lab 09/27/17 2335 09/29/17 0451 09/30/17 0518  WBC 12.0* 8.4 9.7  HGB 14.6 12.7* 12.0*  HCT 41.1 37.0* 34.6*  PLT 229 174 149*   Recent Labs  Lab 09/27/17 1440 09/27/17 2335 09/28/17 0641 09/29/17 0451 09/30/17 0518  NA 128* 135  --  133* 135  K 3.2* 3.0* 3.0* 2.8* 3.1*  CL 89* 98  --  99 100  CO2 21* 22  --  25 27  BUN 6* <5*  --  <5* 5*  CREATININE 0.94 0.83  --  0.94 1.08  CALCIUM 7.9* 7.5*  --  7.3* 8.0*  PROT 6.5 6.1*  --  5.8*  --   BILITOT 1.3* 1.5*  --  1.3*  --   ALKPHOS 94 87  --  77  --   ALT 31 29  --  24  --   AST 63* 62*  --  55*  --   GLUCOSE 134* 119*  --  133* 137*    Imaging/Diagnostic Tests: CLINICAL DATA:  Shortness of breath, progressive.  EXAM: CHEST  1 VIEW  COMPARISON:  Radiograph earlier this day at 1515 hour. Chest CT 07/12/2017  FINDINGS: Unchanged cardiomediastinal contours with stable mild  cardiomegaly. Vague patchy opacity in the right suprahilar lung corresponding to bronchiectasis on prior CT. No new focal airspace disease, pleural effusion or pneumothorax. Unchanged osseous structures.  IMPRESSION: Unchanged appearance of the chest. Vague right perihilar opacity corresponding to bronchiectasis on recent CT.  Electronically Signed   By: Rubye Oaks M.D.   On: 09/27/2017 23:52  Nanci Lakatos, Solmon Ice, DO 09/30/2017, 7:34 AM PGY-1, [facility] FPTS Intern pager: [phone], text pages welcome

## 2017-09-30 NOTE — Care Management Important Message (Signed)
Important Message  Patient Details  Name: KASSIM FRISQUE MRN: [id number] Date of Birth: 1950-06-20   Medicare Important Message Given:  Yes    Dorena Bodo 09/30/2017, 3:41 PM

## 2017-09-30 NOTE — Progress Notes (Signed)
*  PRELIMINARY RESULTS* Echocardiogram 2D Echocardiogram has been performed.  Juan [name] 09/30/2017, 3:03 PM

## 2017-09-30 NOTE — Progress Notes (Signed)
Pt called RN to come to the room. Pt stated that he is SOB and has difficulty breathing. RN sat pt on the side of the bed, placed CPAP machine on. Pt O2 sating at 95 on Room air. Pt requested a breathing treatment as that is what has helped him before. RN made MD aware, put in an order for an albuterol treatment.   Update: Pt states he feels a lot better after the breathing treatment, resting in bed and stated he wants to get some rest.  Will continue to monitor pt's respiratory.

## 2017-10-01 ENCOUNTER — Inpatient Hospital Stay (HOSPITAL_COMMUNITY): Payer: Medicare HMO

## 2017-10-01 DIAGNOSIS — I5043 Acute on chronic combined systolic (congestive) and diastolic (congestive) heart failure: Secondary | ICD-10-CM

## 2017-10-01 LAB — BASIC METABOLIC PANEL
Anion gap: 10 (ref 5–15)
BUN: 6 mg/dL — AB (ref 8–23)
CHLORIDE: 94 mmol/L — AB (ref 98–111)
CO2: 30 mmol/L (ref 22–32)
Calcium: 8.5 mg/dL — ABNORMAL LOW (ref 8.9–10.3)
Creatinine, Ser: 1.21 mg/dL (ref 0.61–1.24)
GFR calc Af Amer: >60 mL/min (ref >60)
GLUCOSE: 141 mg/dL — AB (ref 70–99)
Potassium: 3.5 mmol/L (ref 3.5–5.1)
Sodium: 134 mmol/L — ABNORMAL LOW (ref 135–145)

## 2017-10-01 LAB — CBC
HCT: 37.4 % — ABNORMAL LOW (ref 39.0–52.0)
Hemoglobin: 12.7 g/dL — ABNORMAL LOW (ref 13.0–17.0)
MCH: 31.3 pg (ref 26.0–34.0)
MCHC: 34 g/dL (ref 30.0–36.0)
MCV: 92.1 fL (ref 78.0–100.0)
PLATELETS: 161 10*3/uL (ref 150–400)
RBC: 4.06 MIL/uL — AB (ref 4.22–5.81)
RDW: 14.8 % (ref 11.5–15.5)
WBC: 10 10*3/uL (ref 4.0–10.5)

## 2017-10-01 LAB — MAGNESIUM: Magnesium: 1.5 mg/dL — ABNORMAL LOW (ref 1.7–2.4)

## 2017-10-01 LAB — PROCALCITONIN: Procalcitonin: <0.1 ng/mL

## 2017-10-01 MED ORDER — LOSARTAN POTASSIUM 50 MG PO TABS
25.0000 mg | ORAL_TABLET | Freq: Every day | ORAL | Status: DC
  Administered 2017-10-01 – 2017-10-02 (×2): 25 mg via ORAL
  Filled 2017-10-01 (×2): qty 1

## 2017-10-01 MED ORDER — CLONAZEPAM 0.5 MG PO TABS
0.5000 mg | ORAL_TABLET | Freq: Two times a day (BID) | ORAL | Status: DC
  Administered 2017-10-01 – 2017-10-03 (×4): 0.5 mg via ORAL
  Filled 2017-10-01 (×4): qty 1

## 2017-10-01 MED ORDER — IPRATROPIUM BROMIDE 0.02 % IN SOLN
0.5000 mg | Freq: Four times a day (QID) | RESPIRATORY_TRACT | Status: DC
  Administered 2017-10-01 – 2017-10-02 (×4): 0.5 mg via RESPIRATORY_TRACT
  Filled 2017-10-01 (×4): qty 2.5

## 2017-10-01 MED ORDER — MAGNESIUM SULFATE 2 GM/50ML IV SOLN
2.0000 g | Freq: Once | INTRAVENOUS | Status: AC
  Administered 2017-10-01: 2 g via INTRAVENOUS
  Filled 2017-10-01: qty 50

## 2017-10-01 MED ORDER — CARVEDILOL 12.5 MG PO TABS: 12.5000 mg | ORAL_TABLET | Freq: Once | ORAL | Status: DC

## 2017-10-01 MED ORDER — METHYLPREDNISOLONE SODIUM SUCC 125 MG IJ SOLR: 125.0000 mg | Freq: Once | INTRAMUSCULAR | Status: DC

## 2017-10-01 MED ORDER — LEVALBUTEROL HCL 1.25 MG/0.5ML IN NEBU
1.2500 mg | INHALATION_SOLUTION | Freq: Four times a day (QID) | RESPIRATORY_TRACT | Status: DC
  Administered 2017-10-01 – 2017-10-02 (×4): 1.25 mg via RESPIRATORY_TRACT
  Filled 2017-10-01 (×4): qty 0.5

## 2017-10-01 MED ORDER — LEVALBUTEROL HCL 0.63 MG/3ML IN NEBU
0.6300 mg | INHALATION_SOLUTION | Freq: Four times a day (QID) | RESPIRATORY_TRACT | Status: DC | PRN
  Administered 2017-10-01 – 2017-10-02 (×3): 0.63 mg via RESPIRATORY_TRACT
  Filled 2017-10-01 (×3): qty 3

## 2017-10-01 MED ORDER — HYDRALAZINE HCL 20 MG/ML IJ SOLN: 5.0000 mg | Freq: Three times a day (TID) | INTRAMUSCULAR | Status: DC | PRN

## 2017-10-01 MED ORDER — CARVEDILOL 12.5 MG PO TABS
12.5000 mg | ORAL_TABLET | Freq: Two times a day (BID) | ORAL | Status: DC
  Administered 2017-10-01: 12.5 mg via ORAL
  Filled 2017-10-01: qty 1

## 2017-10-01 MED ORDER — CARVEDILOL 25 MG PO TABS: 25.0000 mg | ORAL_TABLET | Freq: Two times a day (BID) | ORAL | Status: DC

## 2017-10-01 MED ORDER — SPIRONOLACTONE 12.5 MG HALF TABLET
12.5000 mg | ORAL_TABLET | Freq: Every day | ORAL | Status: DC
  Administered 2017-10-01 – 2017-10-03 (×3): 12.5 mg via ORAL
  Filled 2017-10-01 (×3): qty 1

## 2017-10-01 MED ORDER — IPRATROPIUM-ALBUTEROL 0.5-2.5 (3) MG/3ML IN SOLN
3.0000 mL | Freq: Once | RESPIRATORY_TRACT | Status: AC
  Administered 2017-10-01: 3 mL via RESPIRATORY_TRACT
  Filled 2017-10-01: qty 3

## 2017-10-01 NOTE — Progress Notes (Addendum)
FPTS Interim Progress Note  S: Paged by nursing, patient with new increased work of breathing and wheezing acutely this evening. Patient was seen and examined at bedside. He is mildly uncomfortable, speaking in full sentences, diffusely wheezing in all lung fields. Respiratory therapy is at bedside.  O: BP (!) 160/106   Pulse 97   Temp 98.6 F (37 C) (Oral)   Resp (!) 26   Ht 5\' 8"  (1.727 m)   Wt 92.1 kg   SpO2 100%   BMI 30.87 kg/m   GEN: mildly uncomfortable, not acutely toxic, sits in bed CARD:RRR, no m/r/g PULM: diffuse wheezing appreciated in all lung fields, slightly increased work of breathing, speaking in full sentences PSYCH: alert and oriented, appropriate  A/P: Dyspnea/wheezing, new Per chart review, patient had clear lungs for the day team. He did have coreg listed as an intolerance, however day team reports that they discussed this at length with the patient and pharmacy and the patient had no recollection of reacting to this medication. Potentially having a COPD exacerbation in response to the beta blocker. Patient continues to have saturations >90% on 2L by [place]. - discontinue coreg, will have to add something else for rate control in AM - check CXR  - repeat breathing treatment - solumedrol x1 for suspected COPD exacerbation - continue PRN ipratropium and xopenex overnight - will continue to monitor closely  Howard Pouch, MD 10/01/2017, 8:33 PM PGY-3, [facility] Service pager [phone]  Late entry: patient improved after breathing tx x2. Will hold off IV steroids. Appreciate excellent nursing care.

## 2017-10-01 NOTE — Progress Notes (Addendum)
RN notified by RT for pt with increased WOB and inspiratory/expiratory wheezing despite breathing treatments. Pt currently with O2 sats 92 on RA with respirations in the 30s. Pt placed on 2L [place]. Family medicine resident notified of assessment findings and discussion had regarding dose of coreg given at 1742 with coreg listed on allery list. One time additional dose of coreg held at this time. Additional breathing treatment ordered--will continue to monitor.

## 2017-10-01 NOTE — Progress Notes (Signed)
RT went up to pt room to place pt on bipap for the night, pt stated he felt like he was going to vomit. Holding off on bipap for the time being. RT will continue to monitor as needed.

## 2017-10-01 NOTE — Progress Notes (Signed)
Pt BP 166/110 no complaints of chest pain, dizziness, or headache MD notified will continue to monitor

## 2017-10-01 NOTE — Consult Note (Addendum)
The patient has been seen in conjunction with Fabian Sharp, PAC. All aspects of care have been considered and discussed. The patient has been personally interviewed, examined, and all clinical data has been reviewed.   Patient interviewed.  Discussed his prior heart history with his wife by phone.  Recurrent HFrEF 30%, related to alcohol use and possibly other issues.  Previously, HFrEF resolved to HFpEF 60% on medical therapy 2014.  Also at least one EKG with evidence of AF with RVR on this admission.  Has prior history of AF with RVR in setting of alcohol withdrawal.  Recommend: Guideline-directed therapy for decreased LV function ---> Renin/angiotensin inhibition or preferably ARNI, beta-blocker therapy, mineralocorticoid antagonist, and loop diuretic as needed for volume control.  Heart failure therapy should not be discontinued in the future.  We will advise.  Hopefully, on a good heart failure regimen, recurrences of atrial fibrillation will be few.  He will not stop drinking alcohol, therefore, full anticoagulation therapy is not recommended.     Cardiology Consultation:   Patient ID: Juan [name]; [id number]; 06-Aug-1950   Admit date: 09/27/2017 Date of Consult: 10/01/2017  Primary Care Provider: Alveda Reasons, MD Primary Cardiologist: Pixie Casino, MD  Primary Electrophysiologist:     Patient Profile:   Juan [name] is a 67 y.o. male with a hx of nonischemic cardiomyopathy felt secondary to alcohol abuse (30-35% in 2014, then normalized), NSVT in 2014 in the setting of alcohol withdrawal and seizures, COPD, lung nodules, HLD, HTN, atrial fibrillation and anxiety who is being seen today for the evaluation of atrial fibrillation and reduced LVEF at the request of Dr. Mingo Amber.   History of Present Illness:   Juan [name] has a complex history and has not followed up with cardiology since being seen in the hospital in 2016. He has a history of presumed  nonischemic cardiomyopathy thought to be secondary to alcohol abuse. He presented to [facility] 09/09/17 with abdominal pain, nausea, vomiting, and diarrhea x 1 week. He was also dyspneic, hypertensive, and tachycardic. He did not have a tremor and was mentating well. He discharged without intervention. He returned to the ER 09/27/17 with 3 weeks of profuse watery diarrhea. He was SOB and met criteria for sepsis with tachycardia and elevated lactic acid. He as admitted to hospitalist service. He was placed on BiPAP and started on IV ABX. CXR did not show vascular congestion or effusion. C diff was negative. He was noted to be in Afib RVR and started on diltiazem drip. Echocardiogram was completed and showed an LVEF of 30-35% and grade 1 DD. Diltiazem drip was stopped. He has an allergy to coreg, but this was added to his orders to start tonight.  Cardiology was consulted. He received 20 mg lasix IV once on 09/28/17. Does not look like he has been diuresed since.   On my interview, he states that he has fallen 2-3 times in the past 3 weeks, but attributes this to feeling sick and having poor PO intake. He carries a previous diagnosis of Afib in the chart in 2016 and it was mentioned that he was not anticoagulated due to fall risk.   He denies chest pain and lower extremity edema. He is short of breath at baseline. He appears near euvolemic on exam.   Past Medical History:  Diagnosis Date  . Alcoholism ([place])   . Allergy    seasonal   . Anxiety   . Asthma   . CHF (congestive heart failure) (  [place]) 10/2010   ECHO:  EF 40%, Grade II diastolic dysfunction  . COPD (chronic obstructive pulmonary disease) ([place])   . Depression   . Headache   . Hyperlipidemia   . Hypertension   . Seizures ([place])     Past Surgical History:  Procedure Laterality Date  . TOENAIL EXCISION Right 1967   Removal of ingrown toenail [Other]     Home Medications:  Prior to Admission medications   Medication Sig Start Date End Date  Taking? Authorizing Provider  acetaminophen (TYLENOL) 325 MG tablet Take 2 tablets (650 mg total) by mouth every 6 (six) hours as needed for moderate pain. 11/21/14  Yes Kelvin Cellar, MD  albuterol (PROVENTIL) (2.5 MG/3ML) 0.083% nebulizer solution INHALE 1 VIAL VIA NEBULIZER EVERY 6 HOURS AS NEEDED FOR WHEEZING OR SHORTNESS OF BREATH 07/05/17  Yes Diallo, Abdoulaye, MD  aspirin 81 MG chewable tablet Chew 1 tablet (81 mg total) by mouth daily. 12/20/15  Yes Mayo, Pete Pelt, MD  atorvastatin (LIPITOR) 40 MG tablet Take 1 tablet (40 mg total) by mouth daily. 04/10/17  Yes Alveda Reasons, MD  budesonide (PULMICORT) 0.5 MG/2ML nebulizer solution Take 2 mLs (0.5 mg total) by nebulization 2 (two) times daily. 01/15/17  Yes Alveda Reasons, MD  clonazePAM (KLONOPIN) 0.5 MG tablet TAKE 1 TABLET BY MOUTH TWICE A DAY AS NEEDED Patient taking differently: Take 0.5 mg by mouth 3 (three) times daily as needed for anxiety.  07/08/17  Yes Alveda Reasons, MD  EPINEPHrine Base (PRIMATENE MIST IN) Inhale 1 puff into the lungs as needed (for breathing).   Yes [provider]  guaiFENesin (MUCINEX) 600 MG 12 hr tablet Take 600 mg by mouth 2 (two) times daily as needed for cough or to loosen phlegm.   Yes [provider]  ipratropium (ATROVENT) 0.02 % nebulizer solution Take 2.5 mLs (0.5 mg total) by nebulization 4 (four) times daily. Please dispense QS x 1 month 04/08/17  Yes Alveda Reasons, MD  Ipratropium-Albuterol (COMBIVENT RESPIMAT) 20-100 MCG/ACT AERS respimat INHALE 1-2 PUFFS INTO THE LUNGS 6 (SIX) TIMES DAILY AS NEEDED FOR WHEEZING.  Please dispense 2 inhalers per month Patient taking differently: Inhale 1-2 puffs into the lungs See admin instructions. INHALE 1-2 PUFFS INTO THE LUNGS 6 (SIX) TIMES DAILY AS NEEDED FOR WHEEZING.  Please dispense 2 inhalers per month 04/08/17  Yes Alveda Reasons, MD  Multiple Vitamin (MULTIVITAMIN WITH MINERALS) TABS tablet Take 1 tablet by mouth daily as  needed (for vitamin).    Yes [provider]  phenylephrine (4-WAY FAST ACTING) 1 % nasal spray Place 1 drop into both nostrils every 6 (six) hours as needed for congestion (4 way nasal spray).   Yes [provider]  sodium chloride (OCEAN) 0.65 % SOLN nasal spray Place 1 spray into both nostrils as needed for congestion.   Yes [provider]  sodium chloride 1 g tablet Take 1 g by mouth daily.   Yes [provider]  venlafaxine XR (EFFEXOR-XR) 150 MG 24 hr capsule TAKE 1 CAPSULE ONCE DAILY WITH BREAKFAST. Patient taking differently: Take 150 mg by mouth daily with breakfast.  04/14/17  Yes Alveda Reasons, MD  diltiazem (CARDIZEM) 30 MG tablet Take 1 tablet (30 mg total) by mouth every 6 (six) hours. Patient not taking: Reported on 09/09/2017 03/30/17   Everrett Coombe, MD  hydroxypropyl methylcellulose / hypromellose (ISOPTO TEARS / GONIOVISC) 2.5 % ophthalmic solution Place 1 drop into both eyes 3 (three) times  daily as needed for dry eyes. Patient not taking: Reported on 09/09/2017 01/01/15   Lorna Few, DO  Olodaterol HCl 2.5 MCG/ACT AERS Inhale 2 puffs daily into the lungs. Patient not taking: Reported on 09/09/2017 12/13/16   Alveda Reasons, MD  albuterol (PROVENTIL,VENTOLIN) 90 MCG/ACT inhaler Inhale 2 puffs into the lungs every 6 (six) hours as needed for wheezing. 01/03/11 04/22/11  Alveda Reasons, MD    Inpatient Medications: Scheduled Meds: . atorvastatin  40 mg Oral q1800  . budesonide  0.5 mg Nebulization BID  . calcium citrate  400 mg of elemental calcium Oral Daily  . carvedilol  12.5 mg Oral BID WC  . cholecalciferol  1,000 Units Oral Daily  . clonazePAM  0.5 mg Oral BID  . enoxaparin (LOVENOX) injection  40 mg Subcutaneous Q24H  . folic acid  1 mg Oral Daily  . ipratropium  0.5 mg Nebulization Q6H  . levalbuterol  1.25 mg Nebulization Q6H  . levofloxacin  500 mg Oral Daily  . mouth rinse  15 mL Mouth Rinse BID  . multivitamin with  minerals  1 tablet Oral Daily  . nicotine  14 mg Transdermal Daily  . ondansetron (ZOFRAN) IV  4 mg Intravenous Once  . thiamine  100 mg Oral Daily   Or  . thiamine  100 mg Intravenous Daily  . venlafaxine XR  150 mg Oral Q breakfast   Continuous Infusions: . sodium chloride 10 mL/hr at 09/29/17 1624   PRN Meds: acetaminophen **OR** acetaminophen, ondansetron **OR** ondansetron (ZOFRAN) IV  Allergies:    Allergies  Allergen Reactions  . Coreg [Carvedilol] Shortness Of Breath  . Aspartame And Phenylalanine Nausea And Vomiting    Social History:   Social History   Socioeconomic History  . Marital status: Married    Spouse name: Not on file  . Number of children: Not on file  . Years of education: Not on file  . Highest education level: Not on file  Occupational History  . Occupation: Unemployed  Social Needs  . Financial resource strain: Not on file  . Food insecurity:    Worry: Not on file    Inability: Not on file  . Transportation needs:    Medical: Not on file    Non-medical: Not on file  Tobacco Use  . Smoking status: Current Every Day Smoker    Packs/day: 1.50    Years: 40.00    Pack years: 60.00    Types: Cigarettes  . Smokeless tobacco: Never Used  . Tobacco comment: a little more than 1 ppd (02/17/14)  Substance and Sexual Activity  . Alcohol use: Yes    Comment: 1/2 galloon liquior in 2 days     12/28/2014 wife reports 66oz / day  . Drug use: No  . Sexual activity: Not on file  Lifestyle  . Physical activity:    Days per week: Not on file    Minutes per session: Not on file  . Stress: Not on file  Relationships  . Social connections:    Talks on phone: Not on file    Gets together: Not on file    Attends religious service: Not on file    Active member of club or organization: Not on file    Attends meetings of clubs or organizations: Not on file    Relationship status: Not on file  . Intimate partner violence:    Fear of current or ex partner:  Not on file  Emotionally abused: Not on file    Physically abused: Not on file    Forced sexual activity: Not on file  Other Topics Concern  . Not on file  Social History Narrative   Lives with wife in [place].    Family History:    Family History  Problem Relation Age of Onset  . Alzheimer's disease Mother   . Diabetes Mother   . Heart disease Maternal Uncle      ROS:  Please see the history of present illness.   All other ROS reviewed and negative.     Physical Exam/Data:   Vitals:   10/01/17 0831 10/01/17 1214 10/01/17 1230 10/01/17 1340  BP:  (!) 146/62 127/74   Pulse:  95 93 (!) 110  Resp:  (!) 24 (!) 23 (!) 27  Temp:  98.7 F (37.1 C)    TempSrc:      SpO2: 93% 93% 92% 94%  Weight:      Height:        Intake/Output Summary (Last 24 hours) at 10/01/2017 1445 Last data filed at 10/01/2017 0250 Gross per 24 hour  Intake -  Output 1375 ml  Net -1375 ml   Filed Weights   09/27/17 1852  Weight: 92.1 kg   Body mass index is 30.87 kg/m.  General:  Well nourished, well developed, in no acute distress HEENT: normal Neck: no JVD Vascular: No carotid bruits  Cardiac:  Regular rhythm, tachycardic rate Lungs:  Clear in the upper lobes, coarse sounds in bases, no crackles Abd: soft, nontender, no hepatomegaly  Ext: no edema Musculoskeletal:  No deformities, BUE and BLE strength normal and equal Skin: warm and dry  Neuro:  CNs 2-12 intact, no focal abnormalities noted Psych:  Normal affect   EKG:  The EKG was personally reviewed and demonstrates:  Generally sinus tachycardia with RBBB (not new), 09/27/17 at 2336 with atrial fibrillation with RVR Telemetry:  Telemetry was personally reviewed and demonstrates:  Sinus rhythm - sinus tachcyardia  Relevant CV Studies:  Echo 09/30/17: Study Conclusions - Left ventricle: The cavity size was normal. Wall thickness was   increased in a pattern of mild LVH. Systolic function was   moderately to severely reduced.  The estimated ejection fraction   was in the range of 30% to 35%. Diffuse hypokinesis. Doppler   parameters are consistent with abnormal left ventricular   relaxation (grade 1 diastolic dysfunction). - Aortic valve: There was no stenosis. - Mitral valve: Mildly calcified annulus. There was no significant   regurgitation. - Left atrium: The atrium was mildly dilated. - Right ventricle: The cavity size was normal. Systolic function   was normal. - Right atrium: The atrium was mildly dilated. - Pulmonary arteries: No complete TR doppler jet so unable to   estimate PA systolic pressure. - Systemic veins: IVC measured 2.5 cm with > 50% respirophasic   variation, suggesting RA pressure 8 mmHg.  Impressions: - Normal LV size with mild LV hypertrophy. EF 30-35%, diffuse   hypokinesis. Normal RV size and systolic function. No significant   valvular abnormalities.   Laboratory Data:  Chemistry Recent Labs  Lab 09/29/17 0451 09/30/17 0518 10/01/17 0335  NA 133* 135 134*  K 2.8* 3.1* 3.5  CL 99 100 94*  CO2 '25 27 30  ' GLUCOSE 133* 137* 141*  BUN <5* 5* 6*  CREATININE 0.94 1.08 1.21  CALCIUM 7.3* 8.0* 8.5*  GFRNONAA >60 >60 >60  GFRAA >60 >60 >60  ANIONGAP 9 8 10  Recent Labs  Lab 09/27/17 1440 09/27/17 2335 09/29/17 0451  PROT 6.5 6.1* 5.8*  ALBUMIN 3.6 3.3* 3.0*  AST 63* 62* 55*  ALT '31 29 24  ' ALKPHOS 94 87 77  BILITOT 1.3* 1.5* 1.3*   Hematology Recent Labs  Lab 09/29/17 0451 09/29/17 0735 09/30/17 0518 10/01/17 0335  WBC 8.4  --  9.7 10.0  RBC 4.17* 4.40 3.86* 4.06*  HGB 12.7*  --  12.0* 12.7*  HCT 37.0*  --  34.6* 37.4*  MCV 88.7  --  89.6 92.1  MCH 30.5  --  31.1 31.3  MCHC 34.3  --  34.7 34.0  RDW 13.9  --  14.2 14.8  PLT 174  --  149* 161   Cardiac EnzymesNo results for input(s): TROPONINI in the last 168 hours.  Recent Labs  Lab 09/27/17 1501  TROPIPOC 0.08    BNPNo results for input(s): BNP, PROBNP in the last 168 hours.  DDimer No results  for input(s): DDIMER in the last 168 hours.  Radiology/Studies:  Dg Chest 1 View  Result Date: 09/27/2017 CLINICAL DATA:  Shortness of breath, progressive. EXAM: CHEST  1 VIEW COMPARISON:  Radiograph earlier this day at 1515 hour. Chest CT 07/12/2017 FINDINGS: Unchanged cardiomediastinal contours with stable mild cardiomegaly. Vague patchy opacity in the right suprahilar lung corresponding to bronchiectasis on prior CT. No new focal airspace disease, pleural effusion or pneumothorax. Unchanged osseous structures. IMPRESSION: Unchanged appearance of the chest. Vague right perihilar opacity corresponding to bronchiectasis on recent CT. Electronically Signed   By: Jeb Levering M.D.   On: 09/27/2017 23:52   Dg Chest Port 1 View  Result Date: 09/27/2017 CLINICAL DATA:  Dyspnea EXAM: PORTABLE CHEST 1 VIEW COMPARISON:  09/09/2017 chest radiograph. FINDINGS: Stable cardiomediastinal silhouette with mild cardiomegaly. No pneumothorax. Portion of the left costophrenic angle is excluded from the image. No evidence of pleural effusion. Mild hazy patchy right upper parahilar lung opacity with associated bronchiectasis. No acute consolidative airspace disease. IMPRESSION: Stable bronchiectasis with associated mild patchy opacity in the right upper parahilar lung, compatible with nonspecific bronchiolitis. These findings were better depicted on recent 09/09/2017 chest CT. Electronically Signed   By: Ilona Sorrel M.D.   On: 09/27/2017 15:37   Dg Abd Portable 1v  Result Date: 09/27/2017 CLINICAL DATA:  Nausea, vomiting, diarrhea, weakness, pain EXAM: PORTABLE ABDOMEN - 1 VIEW COMPARISON:  01/23/2014 CT abdomen/pelvis FINDINGS: No disproportionately dilated small bowel loops. No evidence of pneumatosis or pneumoperitoneum. No radiopaque nephrolithiasis. IMPRESSION: Nonobstructive bowel gas pattern. Electronically Signed   By: Ilona Sorrel M.D.   On: 09/27/2017 15:35    Assessment and Plan:   1. Atrial  fibrillation vs atrial tachycardia - EKGs with atrial tachycardia, atrial fibrillation on 09/27/17 - cardizem gtt off in the setting of systolic heart failure - coreg started by primary, allergy listed - currently in the 100s in sinus rhythm - This patients CHA2DS2-VASc Score and unadjusted Ischemic Stroke Rate (% per year) is equal to 3.2 % stroke rate/year from a score of 3 (age, CHF, HTN) - will hold off on anticoagulation at this time given fall risk - it is difficult to ascertain what his actual fall risk is given that his last three falls over the past 3 weeks were in the setting of poor PO intake and GI illness.   2. Chronic systolic and diastolic heart failure - echo this admission with EF of 30-35% and grade 1 DD; previously LVEF was 50-55% in 2017 - likely due  to alcohol abuse, he denies anginal symptoms - CXR was negative for signs of CHF on admission - BNP was not collected - he does not appear extremely volume overloaded on exam - will benefit from a beta blocker, if tolerated given his listed allergy, and ACEI/ARB - creatinine 1.21 (1.08) - he is overall net negative 1.4 L and is stable on room air   3. Hypertension - pressures have been elevated until today - no antihypertensive medications    4. Alcohol withdrawal - has seized in the past - CIWA protocol per primary   For questions or updates, please contact [facility] Please consult [email or web address] for contact info under Cardiology/STEMI.   Signed, Tami Lin Duke, PA  10/01/2017 2:45 PM

## 2017-10-01 NOTE — Progress Notes (Addendum)
Family Medicine Teaching Service Daily Progress Note Intern Pager: [phone]  Patient name: Juan [name] Medical record number: [id number] Date of birth: 1951-01-26 Age: 67 y.o. Gender: male  Primary Care Provider: Tobey Grim, MD Consultants: none Code Status: full  Pt Overview and Major Events to Date:  8/24 admitted to fpts  Assessment and Plan: Juan [name] is a 67 y.o. male presenting with dehydration and diarrhea x 3 weeks. PMH is significant for COPD, alcohol dependence with chronic tremor, hypertension, atrial fibrillation, HFpEF, HLD, umbilical hernia, tobacco use, depression/anxiety, and hyponatremia.   3/4 SIRS criteria on admission: Resolved On PO levaquin x7 days, started 8/27.  Has remained afebrile, normotensive, non-tachycardic.  Tachypnic last night when acute shortness of breath.  This AM patient has no complaints. - continue stepdown care - BiPAP as needed - trend wbc - fluids KVO - tylenol for pain control/fever - Levaquin PO x 7 days (8/27- )  Shortness of breath Overnight, night team paged for shortness of breath.  Patient improved following breathing treatments x2.  Coreg had been started yesterday for rate control despite listed allergy with shortness of breath.  Discussed with patient and his wife (who was on the phone) about this allergy.  They stated that they could not remember this occurrence and were agreeable to start medication.  Pharmacy on our inpatient team noted shortness of breath not true allergy for coreg.  Coreg discontinued by night team.  This AM patient's breathing improved.  On RA sating well.    - cont Levaquin PO x 7 days - d/c coreg - start Metoprolol 12.5mg  BID, as is beta 1 selective - BiPAP as needed - Cont home atrovent neb to QID - Cont xopenex nebs to QID - Continue home budesonide nebs BID  - Wean oxygen as tolerated   - add incentive spirometry   Atrial Fibrillation with RVR Dilt drip d/c'ed yesterday as echo  showed new reduced EF.  Cardiology recommended BB therapy, started spironolactone and cozaar.  Cards also stated patients A fib will likely be controlled if HF well controlled.  No anticoagulation as patient is high fall risk.  Patient started on coreg but d/c'ed following shortness of breath last night.  No A fib overnight. - monitor tele - cards consulted, appreciate recs - cont spironolactone and cozaar - start metoprolol 12.5 BID per cards recs - will need repeat BMP in 1 week as outpatient  HFrEF: Stable Echo EF 30-35%,  G1DD.  Cardiology recommended BB therapy, started spironolactone and cozaar.  Patient started on coreg but d/c'ed following shortness of breath last night.  - cards consulted, appreciate recs - cont spironolactone and cozaar - start metoprolol 12.5mg  BID per cards recs - will need repeat BMP in 1 week as outpatient, due to start of new meds  Alcohol Dependence: Chronic CIWAs 4>2>0.  Spoke with patient at length last PM.  He notes he has a set plan for how to decrease drinking as outpatient.  Is not currently interested in pharmacologic assistance.  Also not interested in completely abstaining.  Despite explaining that heart failure is likely 2/2 his alcohol use, patient did not seem to grasp the severity of his drinking.  - valium 5mg  daily - CIWA protocol  - Thiamine, folate, multivitamin  - consider starting naltrexone prior to discharge if patient agrees  Depression/anxiety: Stable.  Patient restarted on home klonopin yesterday although RX states he takes BID and he reports taking TID.  Note from visit with Dr. Gwendolyn Grant  on 06/24/2017, had increased Klonopin to 1mg  in AM and kept 0.5mg  in PM. -Cont home Venlafaxine 150mg  daily - d/c valium 5mg   - cont klonopin 0.5mg  BID, can increase AM dose to 1mg  as needed  Hypokalemia, Hypomagnesemia: Resolved K this AM 3.4. Mg this AM 1.9, following 2g IV Mag sulfate yesterday.    - K-dur today - patient started on  spironolactone yesterday - daily bmp  Hypocalcemia, Vitamin D Deficiency: Improving Ca this AM 8.3 corrects to 9.1. - cont daily calcium carbonate repletion - cont Vitamin D 1000 QD - will need repeat Vit D in 1 month as outpatient - f/u BMP in AM  Hyponatremia: Improved Na this AM 133. Chronically low in the setting of alcohol use.  Holding off on fluids in the setting of CHF. -Monitor BMP   Anemia: Improving Likely anemia of chronic disease from iron panel results. Hgb this AM 12.3 - cont to monitor CBC  Hypertension: Chronic BP overnight stable. BP this AM 137/93 - cont monitor bp - starting coreg 12.5mg  BID  HLD: Chronic Last lipid panel in 04/2017, LDL 148.  -Continue home atorvastatin   Umbilical hernia: Stable Massive, soft supraumbilical hernia, chronically unchanged since 2011. No intervention needed at this time.    Tobacco use: Chronic Smokes 1 PPD for a long time. -14 mg nicotine patch   FEN/GI: Regular PPx: lovenox  Disposition: pending clinical improvement  Subjective:  Patient notes that he is feeling well this AM.  Denies SOB, CP.  Denies anxiety symptoms.  Objective: Temp:  [98.1 F (36.7 C)-98.7 F (37.1 C)] 98.3 F (36.8 C) (08/29 0731) Pulse Rate:  [89-110] 99 (08/29 0820) Resp:  [17-35] 20 (08/29 0820) BP: (127-160)/(62-106) 137/93 (08/29 0820) SpO2:  [90 %-100 %] 100 % (08/29 0824)  Physical Exam: General: 66 y.o. male in NAD Cardio: RRR no m/r/g Lungs: CTAB, no wheezing, no rhonchi, no crackles Abdomen: Soft, non-tender to palpation, positive bowel sounds Skin: warm and dry Extremities: No edema  Laboratory: Recent Labs  Lab 09/30/17 0518 10/01/17 0335 10/02/17 0659  WBC 9.7 10.0 8.7  HGB 12.0* 12.7* 12.3*  HCT 34.6* 37.4* 36.4*  PLT 149* 161 159   Recent Labs  Lab 09/27/17 1440 09/27/17 2335  09/29/17 0451 09/30/17 0518 10/01/17 0335 10/02/17 0659  NA 128* 135  --  133* 135 134* 133*  K 3.2* 3.0*   < > 2.8*  3.1* 3.5 3.4*  CL 89* 98  --  99 100 94* 97*  CO2 21* 22  --  25 27 30 26   BUN 6* <5*  --  <5* 5* 6* 10  CREATININE 0.94 0.83  --  0.94 1.08 1.21 1.13  CALCIUM 7.9* 7.5*  --  7.3* 8.0* 8.5* 8.3*  PROT 6.5 6.1*  --  5.8*  --   --   --   BILITOT 1.3* 1.5*  --  1.3*  --   --   --   ALKPHOS 94 87  --  77  --   --   --   ALT 31 29  --  24  --   --   --   AST 63* 62*  --  55*  --   --   --   GLUCOSE 134* 119*  --  133* 137* 141* 119*   < > = values in this interval not displayed.    Imaging/Diagnostic Tests: Dg Chest Port 1 View  Result Date: 10/01/2017 CLINICAL DATA:  Shortness of breath EXAM:  PORTABLE CHEST 1 VIEW COMPARISON:  09/27/2017 FINDINGS: Heart is upper limits normal in size. Right base atelectasis. Left lung clear. No effusion or acute bony abnormality. IMPRESSION: Right base atelectasis. Electronically Signed   By: Charlett Nose M.D.   On: 10/01/2017 21:40    Eduard Penkala, Solmon Ice, DO 10/02/2017, 9:54 AM PGY-1, [facility] FPTS Intern pager: [phone], text pages welcome

## 2017-10-02 ENCOUNTER — Encounter (HOSPITAL_COMMUNITY): Payer: Self-pay | Admitting: Family Medicine

## 2017-10-02 DIAGNOSIS — R0602 Shortness of breath: Secondary | ICD-10-CM

## 2017-10-02 DIAGNOSIS — G252 Other specified forms of tremor: Secondary | ICD-10-CM | POA: Diagnosis present

## 2017-10-02 DIAGNOSIS — I426 Alcoholic cardiomyopathy: Secondary | ICD-10-CM | POA: Diagnosis present

## 2017-10-02 DIAGNOSIS — G250 Essential tremor: Secondary | ICD-10-CM

## 2017-10-02 DIAGNOSIS — F419 Anxiety disorder, unspecified: Secondary | ICD-10-CM

## 2017-10-02 DIAGNOSIS — I5023 Acute on chronic systolic (congestive) heart failure: Secondary | ICD-10-CM

## 2017-10-02 DIAGNOSIS — I5022 Chronic systolic (congestive) heart failure: Secondary | ICD-10-CM

## 2017-10-02 DIAGNOSIS — R251 Tremor, unspecified: Secondary | ICD-10-CM | POA: Diagnosis present

## 2017-10-02 LAB — CBC
HCT: 36.4 % — ABNORMAL LOW (ref 39.0–52.0)
HEMOGLOBIN: 12.3 g/dL — AB (ref 13.0–17.0)
MCH: 30.8 pg (ref 26.0–34.0)
MCHC: 33.8 g/dL (ref 30.0–36.0)
MCV: 91 fL (ref 78.0–100.0)
Platelets: 159 10*3/uL (ref 150–400)
RBC: 4 MIL/uL — ABNORMAL LOW (ref 4.22–5.81)
RDW: 14.7 % (ref 11.5–15.5)
WBC: 8.7 10*3/uL (ref 4.0–10.5)

## 2017-10-02 LAB — BASIC METABOLIC PANEL
Anion gap: 10 (ref 5–15)
BUN: 10 mg/dL (ref 8–23)
CHLORIDE: 97 mmol/L — AB (ref 98–111)
CO2: 26 mmol/L (ref 22–32)
CREATININE: 1.13 mg/dL (ref 0.61–1.24)
Calcium: 8.3 mg/dL — ABNORMAL LOW (ref 8.9–10.3)
GFR calc Af Amer: >60 mL/min (ref >60)
GFR calc non Af Amer: >60 mL/min (ref >60)
GLUCOSE: 119 mg/dL — AB (ref 70–99)
Potassium: 3.4 mmol/L — ABNORMAL LOW (ref 3.5–5.1)
SODIUM: 133 mmol/L — AB (ref 135–145)

## 2017-10-02 LAB — CULTURE, BLOOD (ROUTINE X 2)
Culture: NO GROWTH
Culture: NO GROWTH
SPECIAL REQUESTS: ADEQUATE

## 2017-10-02 LAB — MAGNESIUM: MAGNESIUM: 1.9 mg/dL (ref 1.7–2.4)

## 2017-10-02 MED ORDER — LOSARTAN POTASSIUM 50 MG PO TABS
50.0000 mg | ORAL_TABLET | Freq: Every day | ORAL | Status: DC
  Administered 2017-10-03: 50 mg via ORAL
  Filled 2017-10-02: qty 1

## 2017-10-02 MED ORDER — METOPROLOL TARTRATE 12.5 MG HALF TABLET
12.5000 mg | ORAL_TABLET | Freq: Two times a day (BID) | ORAL | Status: DC
  Administered 2017-10-02 – 2017-10-03 (×3): 12.5 mg via ORAL
  Filled 2017-10-02 (×3): qty 1

## 2017-10-02 MED ORDER — IPRATROPIUM-ALBUTEROL 0.5-2.5 (3) MG/3ML IN SOLN
3.0000 mL | Freq: Four times a day (QID) | RESPIRATORY_TRACT | Status: DC
  Administered 2017-10-02 – 2017-10-03 (×5): 3 mL via RESPIRATORY_TRACT
  Filled 2017-10-02 (×5): qty 3

## 2017-10-02 MED ORDER — POTASSIUM CHLORIDE CRYS ER 20 MEQ PO TBCR
40.0000 meq | EXTENDED_RELEASE_TABLET | Freq: Once | ORAL | Status: AC
  Administered 2017-10-02: 40 meq via ORAL
  Filled 2017-10-02: qty 2

## 2017-10-02 NOTE — Progress Notes (Addendum)
Family Medicine Teaching Service Daily Progress Note Intern Pager: [phone]  Patient name: Juan [name] Medical record number: [id number] Date of birth: 02/24/1950 Age: 67 y.o. Gender: male  Primary Care Provider: Tobey Grim, MD Consultants: none Code Status: full  Pt Overview and Major Events to Date:  8/24 admitted to fpts  Assessment and Plan: Juan [name] is a 67 y.o. male presenting with dehydration and diarrhea x 3 weeks. PMH is significant for COPD, alcohol dependence with chronic tremor, hypertension, atrial fibrillation, HFpEF, HLD, umbilical hernia, tobacco use, depression/anxiety, and hyponatremia.   Acute on Chronic HFrEF: Stable  Echo EF 30-35%,  G1DD. Started metoprolol yesterday for more selective beta blockade.  Cardiology recommends discharged on metoprolol 12.5mg , low-dose aldactone, losartan.  HR overnight stable, 90s.  - cards consulted, appreciate recs - cont spironolactone and cozaar - cont metoprolol 12.5mg  BID, may need titration as outpatient - follow up with cardiology in 2-4 weeks outpatient - will need repeat BMP in 1 week as outpatient, due to start of new meds  Elevated Cr Cr this AM 1.27.  On admission was 0.94 and has been slowly increasing since.  Baseline appears to be 0.8-0.9.  BUN/Cr ratio 13.  Was placed on Cozaar in the past few days and plan per cards is to increase dose to 50 today. - suspect from initiation of ARB therapy, currently at 30% increase which we will tolerate with starting new therapy -recheck BMP in 1 week as outpatient  3/4 SIRS criteria on admission likely 2/2 CAP: Resolved On PO levaquin x7 days, started 8/27.  Remains afebrile, VSS. - continue stepdown care - BiPAP as needed - trend wbc - fluids KVO - tylenol for pain control/fever - Levaquin PO x 7 days (8/27- )  Shortness of breath  Sating appropriately on RA overnight.  This AM notes some mild SOB that he attributes to mild anxiety. Lungs clear on  exam.   - cont Levaquin PO x 7 days - d/c coreg - cont Metoprolol 12.5mg  BID - BiPAP as needed - Cont home atrovent neb to QID - Cont xopenex nebs to QID - Continue home budesonide nebs BID  - Wean oxygen as tolerated   - cont incentive spirometry  Atrial Fibrillation with RVR See HFrEF plan for cards recs.  Cards believes HF control is most important for A Fib control. - monitor tele - cards consulted, appreciate recs - cont spironolactone and cozaar - Cont metoprolol 12.5 BID  - will need repeat BMP in 1 week as outpatient  Alcohol Dependence: Chronic CIWAs 1>3>0. Out of withdrawal period.  Patient does not appear to have good insight into drinking problem. - valium 5mg  daily - d/c CIWA protocol  - Thiamine, folate, multivitamin  - consider starting naltrexone prior to discharge if patient agrees  Depression/anxiety: Stable.  Notes that it worsens at night. Notes some mild anxiety this AM. -Cont home Venlafaxine 150mg  daily - d/c valium 5mg   - increase klonopin to 1mg  QAM and cont 0.5mg  QHS  Hypokalemia, Hypomagnesemia: Resolved K this AM 4.1. Mg this AM 1.7  - on spironolactone - daily bmp  Hypocalcemia, Vitamin D Deficiency: Improving Ca this AM 8.6, corrects to 9.4. - cont daily calcium carbonate repletion - cont Vitamin D 1000 QD - will need repeat Vit D in 1 month as outpatient - f/u BMP in AM  Hyponatremia: Improved Na this AM 135. Chronically low in the setting of alcohol use. -Monitor BMP   Anemia: Improving Likely anemia of  chronic disease from iron panel results. Hgb this AM 11.8. - cont to monitor CBC  Hypertension: Chronic BP this AM 148/95. - cont monitor bp - on metoprolol, cozaar, spironolactone  HLD: Chronic Last lipid panel in 04/2017, LDL 148.  -Continue home atorvastatin   Umbilical hernia: Stable Massive, soft supraumbilical hernia, chronically unchanged since 2011. No intervention needed at this time.    Tobacco use:  Chronic Smokes 1 PPD for a long time. -14 mg nicotine patch   FEN/GI: Regular PPx: lovenox  Disposition: pending clinical improvement  Subjective:  Patient notes some mild anxiety this AM.  Notes that anxiety is worse at night.  Headache from overnight has resolved.  Denies other complaints this AM.  No CP.  Objective: Temp:  [98 F (36.7 C)-98.7 F (37.1 C)] 98 F (36.7 C) (08/30 0514) Pulse Rate:  [95-107] 97 (08/30 0514) Resp:  [18-20] 20 (08/30 0514) BP: (141-151)/(91-100) 148/95 (08/30 0514) SpO2:  [91 %-99 %] 98 % (08/30 0854)  Physical Exam: General: 67 y.o. male in NAD Cardio: RRR no m/r/g Lungs: CTAB, no wheezing, no rhonchi, no crackles Abdomen: Soft, non-tender to palpation, positive bowel sounds Skin: warm and dry Extremities: No edema   Laboratory: Recent Labs  Lab 10/01/17 0335 10/02/17 0659 10/03/17 0323  WBC 10.0 8.7 8.9  HGB 12.7* 12.3* 11.8*  HCT 37.4* 36.4* 35.4*  PLT 161 159 156   Recent Labs  Lab 09/27/17 1440 09/27/17 2335  09/29/17 0451  10/01/17 0335 10/02/17 0659 10/03/17 0323  NA 128* 135  --  133*   < > 134* 133* 135  K 3.2* 3.0*   < > 2.8*   < > 3.5 3.4* 4.1  CL 89* 98  --  99   < > 94* 97* 100  CO2 21* 22  --  25   < > 30 26 26   BUN 6* <5*  --  <5*   < > 6* 10 17  CREATININE 0.94 0.83  --  0.94   < > 1.21 1.13 1.27*  CALCIUM 7.9* 7.5*  --  7.3*   < > 8.5* 8.3* 8.6*  PROT 6.5 6.1*  --  5.8*  --   --   --   --   BILITOT 1.3* 1.5*  --  1.3*  --   --   --   --   ALKPHOS 94 87  --  77  --   --   --   --   ALT 31 29  --  24  --   --   --   --   AST 63* 62*  --  55*  --   --   --   --   GLUCOSE 134* 119*  --  133*   < > 141* 119* 140*   < > = values in this interval not displayed.    Imaging/Diagnostic Tests: No results found.  Juan [name], Juan Ice, DO 10/03/2017, 9:50 AM PGY-1, [facility] FPTS Intern pager: [phone], text pages welcome

## 2017-10-02 NOTE — Progress Notes (Signed)
Progress Note  Patient Name: Juan [name] Date of Encounter: 10/02/2017  Primary Cardiologist: Chrystie Nose, MD   Subjective   Had episode of respiratory distress last evening.  Treated with steroids and bronchodilator therapy.  Beta-blocker therapy was discontinued by on-call physician.  Inpatient Medications    Scheduled Meds: . atorvastatin  40 mg Oral q1800  . budesonide  0.5 mg Nebulization BID  . calcium citrate  400 mg of elemental calcium Oral Daily  . cholecalciferol  1,000 Units Oral Daily  . clonazePAM  0.5 mg Oral BID  . enoxaparin (LOVENOX) injection  40 mg Subcutaneous Q24H  . folic acid  1 mg Oral Daily  . ipratropium  0.5 mg Nebulization Q6H  . levalbuterol  1.25 mg Nebulization Q6H  . levofloxacin  500 mg Oral Daily  . losartan  25 mg Oral Daily  . mouth rinse  15 mL Mouth Rinse BID  . multivitamin with minerals  1 tablet Oral Daily  . nicotine  14 mg Transdermal Daily  . ondansetron (ZOFRAN) IV  4 mg Intravenous Once  . spironolactone  12.5 mg Oral Daily  . thiamine  100 mg Oral Daily   Or  . thiamine  100 mg Intravenous Daily  . venlafaxine XR  150 mg Oral Q breakfast   Continuous Infusions: . sodium chloride 10 mL/hr at 09/29/17 1624   PRN Meds: acetaminophen **OR** acetaminophen, hydrALAZINE, levalbuterol, ondansetron **OR** ondansetron (ZOFRAN) IV   Vital Signs    Vitals:   10/02/17 0731 10/02/17 0820 10/02/17 0824 10/02/17 1000  BP: (!) 142/95 (!) 137/93  (!) 147/91  Pulse: 92 99    Resp: 17 20    Temp: 98.3 F (36.8 C)     TempSrc: Oral     SpO2: 91% 92% 100%   Weight:      Height:        Intake/Output Summary (Last 24 hours) at 10/02/2017 1147 Last data filed at 10/02/2017 0511 Gross per 24 hour  Intake 142.3 ml  Output 760 ml  Net -617.7 ml   Filed Weights   09/27/17 1852  Weight: 92.1 kg    Telemetry    Sinus rhythm and sinus tachycardia.  No atrial fibrillation noted during episode of dyspnea and wheezing.-  Personally Reviewed  ECG    A new tracing is not performed today.- Personally Reviewed  Physical Exam  Obese and in no distress.  Breathing comfortably. GEN:  Appears calm Neck:  Mildly elevated Cardiac: RRR, no murmurs, rubs, or gallops.  Respiratory: Clear to auscultation bilaterally. GI: Soft, nontender, non-distended  MS: No edema; No deformity. Neuro:  Nonfocal  Psych: Normal affect   Labs    Chemistry Recent Labs  Lab 09/27/17 1440 09/27/17 2335  09/29/17 0451 09/30/17 0518 10/01/17 0335 10/02/17 0659  NA 128* 135  --  133* 135 134* 133*  K 3.2* 3.0*   < > 2.8* 3.1* 3.5 3.4*  CL 89* 98  --  99 100 94* 97*  CO2 21* 22  --  25 27 30 26   GLUCOSE 134* 119*  --  133* 137* 141* 119*  BUN 6* <5*  --  <5* 5* 6* 10  CREATININE 0.94 0.83  --  0.94 1.08 1.21 1.13  CALCIUM 7.9* 7.5*  --  7.3* 8.0* 8.5* 8.3*  PROT 6.5 6.1*  --  5.8*  --   --   --   ALBUMIN 3.6 3.3*  --  3.0*  --   --   --  AST 63* 62*  --  55*  --   --   --   ALT 31 29  --  24  --   --   --   ALKPHOS 94 87  --  77  --   --   --   BILITOT 1.3* 1.5*  --  1.3*  --   --   --   GFRNONAA >60 >60  --  >60 >60 >60 >60  GFRAA >60 >60  --  >60 >60 >60 >60  ANIONGAP 18* 15  --  9 8 10 10    < > = values in this interval not displayed.     Hematology Recent Labs  Lab 09/30/17 0518 10/01/17 0335 10/02/17 0659  WBC 9.7 10.0 8.7  RBC 3.86* 4.06* 4.00*  HGB 12.0* 12.7* 12.3*  HCT 34.6* 37.4* 36.4*  MCV 89.6 92.1 91.0  MCH 31.1 31.3 30.8  MCHC 34.7 34.0 33.8  RDW 14.2 14.8 14.7  PLT 149* 161 159    Cardiac EnzymesNo results for input(s): TROPONINI in the last 168 hours.  Recent Labs  Lab 09/27/17 1501  TROPIPOC 0.08     BNPNo results for input(s): BNP, PROBNP in the last 168 hours.   DDimer No results for input(s): DDIMER in the last 168 hours.   Radiology    Dg Chest Port 1 View  Result Date: 10/01/2017 CLINICAL DATA:  Shortness of breath EXAM: PORTABLE CHEST 1 VIEW COMPARISON:  09/27/2017  FINDINGS: Heart is upper limits normal in size. Right base atelectasis. Left lung clear. No effusion or acute bony abnormality. IMPRESSION: Right base atelectasis. Electronically Signed   By: Charlett Nose M.D.   On: 10/01/2017 21:40    Cardiac Studies   2D Doppler echocardiogram 09/30/2017: Study Conclusions  - Left ventricle: The cavity size was normal. Wall thickness was   increased in a pattern of mild LVH. Systolic function was   moderately to severely reduced. The estimated ejection fraction   was in the range of 30% to 35%. Diffuse hypokinesis. Doppler   parameters are consistent with abnormal left ventricular   relaxation (grade 1 diastolic dysfunction). - Aortic valve: There was no stenosis. - Mitral valve: Mildly calcified annulus. There was no significant   regurgitation. - Left atrium: The atrium was mildly dilated. - Right ventricle: The cavity size was normal. Systolic function   was normal. - Right atrium: The atrium was mildly dilated. - Pulmonary arteries: No complete TR doppler jet so unable to   estimate PA systolic pressure. - Systemic veins: IVC measured 2.5 cm with > 50% respirophasic   variation, suggesting RA pressure 8 mmHg.  Impressions:  - Normal LV size with mild LV hypertrophy. EF 30-35%, diffuse   hypokinesis. Normal RV size and systolic function. No significant   valvular abnormalities.  Patient Profile     67 y.o. male  with a hx of nonischemic cardiomyopathy felt secondary to alcohol abuse (30-35% in 2014, then normalized), NSVT in 2014 in the setting of alcohol withdrawal and seizures, COPD, lung nodules, HLD, HTN, atrial fibrillation and anxiety who is being seen today for the evaluation of atrial fibrillation and reduced LVEF .  Assessment & Plan    1. Known non-ischemic cardiomyopathy with chronic systolic heart failure.  Guideline directed therapy recommended to prevent progression of systolic dysfunction.  Uptitrate angiotensin receptor  blockade and consider switching to Entresto.  Low-dose Aldactone has already been started.  Therapy of systolic heart failure will be seriously compromise of  beta-blocker therapy is not possible.  Perhaps we should try low-dose beta 1 selective agent such as metoprolol to avoid the impact of bronchospasm.  Will start 12.5 mg twice daily and transition to metoprolol succinate if the patient can tolerate low-dose metoprolol tartrate. 2. No obvious recurrence of atrial fibrillation overnight. 3. Hypokalemia needs to be repleted but care must be taken given Aldactone and ARB therapy.       For questions or updates, please contact [facility] Please consult [email or web address] for contact info under Cardiology/STEMI.      Signed, Lesleigh Noe, MD  10/02/2017, 11:47 AM

## 2017-10-02 NOTE — Progress Notes (Signed)
RT asked pt if he wanted to go on BIPAP dreamstation tonight and pt stated he will place himself on it when he is ready tonight. Pt settings are 22/6 w/no O2 bled in. RT will continue to monitor.

## 2017-10-02 NOTE — Consult Note (Signed)
   [facility] CM Inpatient Consult   10/02/2017  TAIJON VINK [birth year] [id number]  Patient with a history with [facility] care management services in the [facility] plan.    Met with the patient regarding the benefits of [place] [facility] services.  Patient states he feels he has no needs. Explained that [place] [facility] is a covered benefit of insurance. Review information for [facility] Care Management and a brochure was provided with contact information. He denies any pharmacy needs as well. "I think it all got it all handled."  Explained that [place] [facility] does not interfere with or replace any services arranged by the inpatient care management staff.  Patient declined services with [facility] for telephonic or home visit needs.  He states he knows how to reach [facility] with the information provided today.   For questions, please contact:  Natividad Brood, RN BSN [facility] Liaison  [phone] business mobile phone Toll free office [phone]

## 2017-10-02 NOTE — Discharge Summary (Signed)
[facility] Discharge Summary  Patient name: Juan [name] Medical record number: [id number] Date of birth: 1950/09/21 Age: 67 y.o. Gender: male Date of Admission: 09/27/2017  Date of Discharge: 10/03/2017 Admitting Physician: Alveda Reasons, MD  Primary Care Provider: Alveda Reasons, MD Consultants: Cardiology, Pulmonology (by phone)  Indication for Hospitalization: Dehydration and Diarrhea  Discharge Diagnoses/Problem List:  Acute on Chronic HFrEF Elevated Cr 3/4 SIRS Criteria on admission Shortness of breath A Fib with RVR Alcohol Dependence Depression/anxiety Hypokalemia, Hypomagnesemia Hypocalcemia, Vitamin D Deficiency Hyponatremia Hypertension HLD Umbilical hernia Tobacco use  Disposition: Home  Discharge Condition: Stable  Discharge Exam:  General: 67 y.o. male in NAD Cardio: RRR no m/r/g Lungs: CTAB, no wheezing, no rhonchi, no crackles Abdomen: Soft, non-tender to palpation, positive bowel sounds Skin: warm and dry Extremities: No edema  Brief Hospital Course:  [name] is a 67 y.o. male who presented with dehydration and diarrhea.  At the time, he met 3/4 SIRS criteria, please see H&P for further details.  His GI workup was negative for acute infection.  His diarrhea resolved and his vitals improved following fluid resuscitation.  The patient did have a productive cough and his chest x-ray was suggestive of bronchiectasis and possible pneumonia.  After speaking to pulmonology on the phone, it was decided to treat the patient for CAP with a 7 day course of Levaquin.  With the use of his home breathing treatments and the antibiotic treatment, the patients breathing improved to baseline and he remained afebrile.  At the time of discharge he was sating appropriately on room air, without increased work of breathing, and his lungs were clear.    During his hospitalization, the patient was found to be in A fib with RVR.  He  was started on a diltiazem drip, which improved his rate, it was being titrated appropriately when an Echocardiogram showed a new reduced ejection fraction of 30-35%.  Due to the new reduced EF, the dilt drip was discontinued.  Cardiology was consulted and started the patient on a beta blocker, ARB, and spironolactone.  His heart rate remained well-controlled on this therapy.  It is of note that cardiology believes the patient's worsening of heart failure is likely secondary to his alcohol dependence.  It is recommended that this patient abstain from alcohol and this was discussed with him at length by multiple providers.  Cardiology also stated that proper treatment of his CHF will likely also control his atrial fibrillation.  The patient's other chronic medical problems were addressed throughout his hospitalization.  He was discharged in stable condition and was medically cleared for discharge.     Issues for Follow Up:  1. Will need repeat Vitamin D Level in 1 month, as was started on Vitamin D 1000 QD for deficiency.   2. Will need BMP repeat in 1 week for addition of spironolactone (K 4.1 on discharge), increased Cr (1.27 at discharge), hypomegnesemia (1.7 on discharge), and chronic hyponatremia (Na 135 on discharge). 3. Will need follow up of alcohol abuse and further encouragement to stop drinking.  Significant Procedures: None  Significant Labs and Imaging:  Recent Labs  Lab 10/01/17 0335 10/02/17 0659 10/03/17 0323  WBC 10.0 8.7 8.9  HGB 12.7* 12.3* 11.8*  HCT 37.4* 36.4* 35.4*  PLT 161 159 156   Recent Labs  Lab 09/27/17 1440 09/27/17 2335  09/29/17 0451 09/29/17 1003 09/30/17 0518 10/01/17 0335 10/02/17 0659 10/03/17 0323  NA 128* 135  --  133*  --  135 134* 133* 135  K 3.2* 3.0*   < > 2.8*  --  3.1* 3.5 3.4* 4.1  CL 89* 98  --  99  --  100 94* 97* 100  CO2 21* 22  --  25  --  _0 GLUCOSE 134* 119*  --  133*  --  137* 141* 119* 140*  BUN 6* <5*  --  <5*  --   5* 6* 10 17  CREATININE 0.94 0.83  --  0.94  --  1.08 1.21 1.13 1.27*  CALCIUM 7.9* 7.5*  --  7.3*  --  8.0* 8.5* 8.3* 8.6*  MG  --   --   --   --  1.0* 1.5* 1.5* 1.9 1.7  ALKPHOS 94 87  --  77  --   --   --   --   --   AST 63* 62*  --  55*  --   --   --   --   --   ALT 31 29  --  24  --   --   --   --   --   ALBUMIN 3.6 3.3*  --  3.0*  --   --   --   --   --    < > = values in this interval not displayed.   2D Doppler echocardiogram 09/30/2017: Study Conclusions  - Left ventricle: The cavity size was normal. Wall thickness was increased in a pattern of mild LVH. Systolic function was moderately to severely reduced. The estimated ejection fraction was in the range of 30% to 35%. Diffuse hypokinesis. Doppler parameters are consistent with abnormal left ventricular relaxation (grade 1 diastolic dysfunction). - Aortic valve: There was no stenosis. - Mitral valve: Mildly calcified annulus. There was no significant regurgitation. - Left atrium: The atrium was mildly dilated. - Right ventricle: The cavity size was normal. Systolic function was normal. - Right atrium: The atrium was mildly dilated. - Pulmonary arteries: No complete TR doppler jet so unable to estimate PA systolic pressure. - Systemic veins: IVC measured 2.5 cm with > 50% respirophasic variation, suggesting RA pressure 8 mmHg.  Impressions:  - Normal LV size with mild LV hypertrophy. EF 30-35%, diffuse hypokinesis. Normal RV size and systolic function. No significant valvular abnormalities   Results/Tests Pending at Time of Discharge: None  Discharge Medications:  Allergies as of 10/03/2017      Reactions   Coreg [carvedilol] Shortness Of Breath   Shortness of breath with rechallenge 10/01/17   Aspartame And Phenylalanine Nausea And Vomiting      Medication List    STOP taking these medications   diltiazem 30 MG tablet Commonly known as:  CARDIZEM   hydroxypropyl methylcellulose /  hypromellose 2.5 % ophthalmic solution Commonly known as:  ISOPTO TEARS / GONIOVISC   Olodaterol HCl 2.5 MCG/ACT Aers   sodium chloride 1 g tablet     TAKE these medications   4-WAY FAST ACTING 1 % nasal spray Generic drug:  phenylephrine Place 1 drop into both nostrils every 6 (six) hours as needed for congestion (4 way nasal spray).   acetaminophen 325 MG tablet Commonly known as:  TYLENOL Take 2 tablets (650 mg total) by mouth every 6 (six) hours as needed for moderate pain.   albuterol (2.5 MG/3ML) 0.083% nebulizer solution Commonly known as:  PROVENTIL INHALE 1 VIAL VIA NEBULIZER EVERY 6 HOURS AS NEEDED FOR WHEEZING OR SHORTNESS OF BREATH  aspirin 81 MG chewable tablet Chew 1 tablet (81 mg total) by mouth daily.   atorvastatin 40 MG tablet Commonly known as:  LIPITOR Take 1 tablet (40 mg total) by mouth daily.   budesonide 0.5 MG/2ML nebulizer solution Commonly known as:  PULMICORT Take 2 mLs (0.5 mg total) by nebulization 2 (two) times daily.   calcium citrate 950 MG tablet Commonly known as:  CALCITRATE - dosed in mg elemental calcium Take 2 tablets (400 mg of elemental calcium total) by mouth daily.   Cholecalciferol 1000 units tablet Take 1 tablet (1,000 Units total) by mouth daily.   clonazePAM 0.5 MG tablet Commonly known as:  KLONOPIN Take 1 tablet (0.5 mg total) by mouth at bedtime. What changed:    when to take this  reasons to take this   clonazePAM 1 MG tablet Commonly known as:  KLONOPIN Take 1 tablet (1 mg total) by mouth every morning. What changed:  You were already taking a medication with the same name, and this prescription was added. Make sure you understand how and when to take each.   guaiFENesin 600 MG 12 hr tablet Commonly known as:  MUCINEX Take 600 mg by mouth 2 (two) times daily as needed for cough or to loosen phlegm.   ipratropium 0.02 % nebulizer solution Commonly known as:  ATROVENT Take 2.5 mLs (0.5 mg total) by  nebulization 4 (four) times daily. Please dispense QS x 1 month   Ipratropium-Albuterol 20-100 MCG/ACT Aers respimat Commonly known as:  COMBIVENT INHALE 1-2 PUFFS INTO THE LUNGS 6 (SIX) TIMES DAILY AS NEEDED FOR WHEEZING.  Please dispense 2 inhalers per month What changed:    how much to take  how to take this  when to take this   levofloxacin 500 MG tablet Commonly known as:  LEVAQUIN Take 1 tablet (500 mg total) by mouth daily for 2 days.   losartan 50 MG tablet Commonly known as:  COZAAR Take 1 tablet (50 mg total) by mouth daily.   metoprolol succinate 25 MG 24 hr tablet Commonly known as:  TOPROL-XL Take 0.5 tablets (12.5 mg total) by mouth daily.   multivitamin with minerals Tabs tablet Take 1 tablet by mouth daily as needed (for vitamin).   PRIMATENE MIST IN Inhale 1 puff into the lungs as needed (for breathing).   sodium chloride 0.65 % Soln nasal spray Commonly known as:  OCEAN Place 1 spray into both nostrils as needed for congestion.   spironolactone 25 MG tablet Commonly known as:  ALDACTONE Take 0.5 tablets (12.5 mg total) by mouth daily.   venlafaxine XR 150 MG 24 hr capsule Commonly known as:  EFFEXOR-XR TAKE 1 CAPSULE ONCE DAILY WITH BREAKFAST. What changed:  See the new instructions.       Discharge Instructions: Please refer to Patient Instructions section of EMR for full details.  Patient was counseled important signs and symptoms that should prompt return to medical care, changes in medications, dietary instructions, activity restrictions, and follow up appointments.   Follow-Up Appointments: Follow-up Information    Minus Breeding, MD Follow up.   Specialty:  Cardiology Why:  our office will call you in 1-2 weeks with a hospital follow visit  Contact information: [address] [place] [address] [phone]        Alveda Reasons, MD. Go on 10/07/2017.   Specialty:  Family Medicine Why:  at 8:30 am Contact  information: [place] [address] [phone]  Pixie Casino, MD .   Specialty:  Cardiology Contact information: [place] [address] [phone]           Cleophas Dunker, DO 10/04/2017, 1:54 PM PGY-1, [place]

## 2017-10-03 DIAGNOSIS — R52 Pain, unspecified: Secondary | ICD-10-CM

## 2017-10-03 DIAGNOSIS — F1020 Alcohol dependence, uncomplicated: Secondary | ICD-10-CM

## 2017-10-03 LAB — MAGNESIUM: Magnesium: 1.7 mg/dL (ref 1.7–2.4)

## 2017-10-03 LAB — CBC
HEMATOCRIT: 35.4 % — AB (ref 39.0–52.0)
Hemoglobin: 11.8 g/dL — ABNORMAL LOW (ref 13.0–17.0)
MCH: 30.8 pg (ref 26.0–34.0)
MCHC: 33.3 g/dL (ref 30.0–36.0)
MCV: 92.4 fL (ref 78.0–100.0)
PLATELETS: 156 10*3/uL (ref 150–400)
RBC: 3.83 MIL/uL — ABNORMAL LOW (ref 4.22–5.81)
RDW: 14.8 % (ref 11.5–15.5)
WBC: 8.9 10*3/uL (ref 4.0–10.5)

## 2017-10-03 LAB — BASIC METABOLIC PANEL
Anion gap: 9 (ref 5–15)
BUN: 17 mg/dL (ref 8–23)
CO2: 26 mmol/L (ref 22–32)
CREATININE: 1.27 mg/dL — AB (ref 0.61–1.24)
Calcium: 8.6 mg/dL — ABNORMAL LOW (ref 8.9–10.3)
Chloride: 100 mmol/L (ref 98–111)
GFR, EST NON AFRICAN AMERICAN: 58 mL/min — AB (ref >60)
Glucose, Bld: 140 mg/dL — ABNORMAL HIGH (ref 70–99)
POTASSIUM: 4.1 mmol/L (ref 3.5–5.1)
SODIUM: 135 mmol/L (ref 135–145)

## 2017-10-03 MED ORDER — CLONAZEPAM 1 MG PO TABS: 1.0000 mg | ORAL_TABLET | Freq: Every morning | ORAL | Status: DC

## 2017-10-03 MED ORDER — METOPROLOL SUCCINATE ER 25 MG PO TB24: 12.5000 mg | ORAL_TABLET | Freq: Every day | ORAL | Status: DC

## 2017-10-03 MED ORDER — LEVOFLOXACIN 500 MG PO TABS: 500.0000 mg | ORAL_TABLET | Freq: Every day | ORAL | 0 refills | Status: AC

## 2017-10-03 MED ORDER — CALCIUM CITRATE 950 (200 CA) MG PO TABS: 400.0000 mg | ORAL_TABLET | Freq: Every day | ORAL | 0 refills | Status: DC

## 2017-10-03 MED ORDER — CLONAZEPAM 0.5 MG PO TABS: 0.5000 mg | ORAL_TABLET | Freq: Every day | ORAL | Status: DC

## 2017-10-03 MED ORDER — CLONAZEPAM 0.5 MG PO TABS: 0.5000 mg | ORAL_TABLET | Freq: Every day | ORAL | 0 refills | Status: DC

## 2017-10-03 MED ORDER — CHOLECALCIFEROL 25 MCG (1000 UT) PO TABS: 1000.0000 [IU] | ORAL_TABLET | Freq: Every day | ORAL | 0 refills | Status: DC

## 2017-10-03 MED ORDER — LOSARTAN POTASSIUM 50 MG PO TABS: 50.0000 mg | ORAL_TABLET | Freq: Every day | ORAL | 0 refills | Status: DC

## 2017-10-03 MED ORDER — SPIRONOLACTONE 25 MG PO TABS: 12.5000 mg | ORAL_TABLET | Freq: Every day | ORAL | 0 refills | Status: DC

## 2017-10-03 MED ORDER — METOPROLOL SUCCINATE ER 25 MG PO TB24: 12.5000 mg | ORAL_TABLET | Freq: Every day | ORAL | 0 refills | Status: DC

## 2017-10-03 MED ORDER — CLONAZEPAM 1 MG PO TABS: 1.0000 mg | ORAL_TABLET | Freq: Every morning | ORAL | 0 refills | Status: DC

## 2017-10-03 MED ORDER — IBUPROFEN 400 MG PO TABS
400.0000 mg | ORAL_TABLET | Freq: Four times a day (QID) | ORAL | Status: AC | PRN
  Administered 2017-10-03: 400 mg via ORAL
  Filled 2017-10-03: qty 1

## 2017-10-03 NOTE — Care Management Note (Signed)
Case Management Note  Patient Details  Name: PARVIN CRUZE MRN: [id number] Date of Birth: [birth year]  Subjective/Objective:                CHF    Action/Plan:  Spoke w patient at bedside. He states he lives at home w his wife. He has DME nebulizer, RW available. He does not endorse needing additional DME nor HH. Is agreeable to being screened by [facility] for community follow up. Order placed. He describes himself as independent, he does not drive but his wife does. He denies any burden obtaining his medication. No other CM needs identified.   Expected Discharge Date:  10/03/17               Expected Discharge Plan:  Home/Self Care  In-House Referral:     Discharge planning Services  CM Consult  Post Acute Care Choice:    Choice offered to:     DME Arranged:    DME Agency:     HH Arranged:    HH Agency:     Status of Service:  Completed, signed off  If discussed at [employer] of Stay Meetings, dates discussed:    Additional Comments:  Lawerance Sabal, RN 10/03/2017, 3:42 PM

## 2017-10-03 NOTE — Discharge Instructions (Signed)
What You Need To Know About Alcohol Abuse and Dependence, Adult Alcohol is a widely available drug. People who use alcohol will consume it in varying amounts. People who drink alcohol in excess, and have behavior problems during and after drinking alcohol, may have what is called an alcohol use disorder. Alcohol abuse and alcohol dependence are the two main types of alcohol use disorders:  Alcohol abuse is when you use alcohol too much or too often. You may use alcohol to make yourself feel happy or to reduce stress, but you may have a hard time setting a limit on the amount you drink.  Alcohol dependence is when you use alcohol excessively for a period of time, and your body and brain chemistry changes as a result. This can make it hard to stop drinking because you may start to feel sick or feel different when you do not use alcohol.  How can alcohol abuse and dependence affect me? Alcohol abuse and dependence can have a negative effect on your life. Excessive use of alcohol may lead to an addiction. You may feel like you need alcohol to function normally. You may drink alcohol before work in the morning, during the day, or as soon as you get home from work in the evening. These actions can result in:  Poor performance at work.  Losing your job.  Financial problems.  Car crashes or criminal charges from driving after drinking alcohol.  Problems in your relationships with friends and family.  Losing the trust and respect of co-workers, friends, and family.  Drinking heavily over a long period of time can permanently damage your body and brain, and can cause lifelong health issues, such as:  Liver disease.  Heart problems, high blood pressure, or stroke.  Damage to your pancreas.  Certain cancers.  Decreased ability to fight infections.  Numbness or tingling in hands or feet (neuropathy).  Brain damage.  Depression.  Early (premature) death.  When your body craves alcohol, it  is easy to drink more than your body can handle. As a result, you may overdose. Alcohol overdose is a serious situation that requires hospitalization. It may lead to permanent injuries or death. What are the benefits of avoiding alcohol use? Limiting or avoiding alcohol can help you:  Avoid risks to your body, brain, and relationships.  Avoid the risk of abusing or becoming dependent on alcohol.  Keep your mind and body healthy. As a result, you may be more likely to accomplish your life goals.  Avoid permanent injury, organ damage, or death due to alcohol use.  What steps can I take to stop drinking?  The best way to avoid alcohol abuse, dependence, and addiction is not to drink at all, or to drink measured amounts. Measured drinking means no more than 1 drink a day for nonpregnant women and 2 drinks a day for men. One drink equals 12 oz of beer, 5 oz of wine, or 1 oz of hard liquor.  Stop drinking if you have been drinking too much. This can be very hard to do if you are used to abusing alcohol. If you find it hard to stop drinking, talk about your experience with someone you trust. This person may be able to help you change your drinking behavior.  Instead of drinking alcohol, do something else, like a hobby or exercise.  Find healthy ways to cope with stress, such as exercise, meditation, or spending time with people you care about.  In social gatherings and places   where there may be alcohol, make intentional choices to drink non-alcohol beverages.  If your family, co-workers, or friends drink, talk to them about supporting you in your efforts to stop drinking. Ask them not to drink around you. Spend more time with people who do not drink alcohol.  If you think that you have an alcohol dependency problem: ? Tell friends or family about your concerns. ? Talk with your health care provider or another health professional about where to get help. ? Work with a therapist and a chemical  dependency counselor. ? Consider joining a support group for people who struggle with alcohol abuse, dependence, and addiction. Where to find support: You can get support for preventing alcohol abuse, dependence, and addiction from:  Your health care provider.  Alcoholics Anonymous (AA): www.aa.org  SMART Recovery: www.smartrecovery.org  Local treatment centers or chemical dependency counselors.  Where to find more information: Learn more about alcohol abuse and dependence from:  Centers for Disease Control and Prevention: www.cdc.gov/alcohol/fact-sheets/alcohol-use.htm  National Institute on Alcohol Abuse and Alcoholism: www.niaaa.nih.gov/alcohol-health/overview-alcohol-consumption  Local AA groups in your community.  Contact a health care provider if:  You drink more or for longer than you intended, on more than one occasion.  You tried to stop drinking or to cut back on how much you drink, but you were not able to.  You often drink to the point of vomiting or passing out.  You want to drink so badly that you cannot think about anything else.  Drinking has created problems in your life, but you continue to drink.  You keep drinking even though you feel anxious, depressed, or have experienced memory loss.  You have stopped doing the things you used to enjoy in order to drink.  You have to drink more than you used to in order to get the effect you want.  You experience anxiety, sweating, nausea, shakiness, and trouble sleeping when you try to stop drinking.  You have thoughts about hurting yourself or others. If you ever feel like you may hurt yourself or others, or have thoughts about taking your own life, get help right away. You can go to your nearest emergency department or call:  Your local emergency services (911 in the [place]  A suicide crisis helpline, such as the National Suicide Prevention Lifeline at 1-800-273-8255. This is open 24 hours a  day.  Summary  Alcohol is a widely available drug. Misusing, abusing, and becoming dependent on alcohol can cause many problems.  It is important to measure and limit the amount of alcohol you consume. It is recommended to limit alcohol use to 1 drink a day for nonpregnant women and 2 drinks a day for men.  The risks associated with drinking too much will have a direct negative impact on your work, relationships, and health.  If you realize that you are having some challenges keeping your drinking under control, find some ways to change your behavior. Hobbies, self calming activities, exercise, or support groups can help.  If you feel you need help with changing your drinking habits, talk with your health care provider, a good friend, or a therapist, or go to an AA group. This information is not intended to replace advice given to you by your health care provider. Make sure you discuss any questions you have with your health care provider. Document Released: 01/16/2016 Document Revised: 01/16/2016 Document Reviewed: 01/16/2016 Elsevier Interactive Patient Education  2018 Elsevier Inc.  

## 2017-10-03 NOTE — Progress Notes (Signed)
[facility] will sign off.   Medication Recommendations: Low-dose Aldactone, losartan, switch to metoprolol succinate 12.5 mg daily with to be titrated as OP, Other recommendations (labs, testing, etc): Needs basic metabolic panel in 1 week. Follow up as an outpatient: Needs cardiology follow-up with Dr. Antoine Poche in 2 to 4 weeks for further optimization of medical therapy.

## 2017-10-03 NOTE — Consult Note (Signed)
   [facility] CM Inpatient Consult   10/03/2017  Juan [name] July 26, 1950 [id number]   Follow up on referral:  Spoke with the patient regarding referral for [facility] Care Management services.  Patient states, "I just want to see how I do at home and I have your information you gave me.  I will call if I feel like I need any help but, thank you."  Encouraged to call the 24 nurse line at any time when he transitions home.  He agreed.  For questions,  Charlesetta Shanks, RN BSN CCM [facility]  [phone] business mobile phone Toll free office [phone]

## 2017-10-04 DIAGNOSIS — R52 Pain, unspecified: Secondary | ICD-10-CM

## 2017-10-06 NOTE — Care Management Important Message (Signed)
Important Message  Patient Details  Name: ASIF HASEK MRN: [id number] Date of Birth: [birth year]   Medicare Important Message Given:  Yes Patient signed on 10/03/2017  Dorena Bodo 10/06/2017, 9:47 AM

## 2017-10-07 ENCOUNTER — Ambulatory Visit (INDEPENDENT_AMBULATORY_CARE_PROVIDER_SITE_OTHER): Payer: Medicare HMO | Admitting: Family Medicine

## 2017-10-07 ENCOUNTER — Encounter: Payer: Self-pay | Admitting: Family Medicine

## 2017-10-07 ENCOUNTER — Other Ambulatory Visit: Payer: Self-pay

## 2017-10-07 DIAGNOSIS — F1020 Alcohol dependence, uncomplicated: Secondary | ICD-10-CM

## 2017-10-07 DIAGNOSIS — I5023 Acute on chronic systolic (congestive) heart failure: Secondary | ICD-10-CM

## 2017-10-07 DIAGNOSIS — R69 Illness, unspecified: Secondary | ICD-10-CM | POA: Diagnosis not present

## 2017-10-07 DIAGNOSIS — I4891 Unspecified atrial fibrillation: Secondary | ICD-10-CM | POA: Diagnosis not present

## 2017-10-07 DIAGNOSIS — Z23 Encounter for immunization: Secondary | ICD-10-CM | POA: Diagnosis not present

## 2017-10-07 DIAGNOSIS — J479 Bronchiectasis, uncomplicated: Secondary | ICD-10-CM

## 2017-10-07 DIAGNOSIS — R197 Diarrhea, unspecified: Secondary | ICD-10-CM | POA: Diagnosis not present

## 2017-10-07 DIAGNOSIS — I426 Alcoholic cardiomyopathy: Secondary | ICD-10-CM

## 2017-10-07 MED ORDER — CLONAZEPAM 0.5 MG PO TABS: 0.5000 mg | ORAL_TABLET | Freq: Three times a day (TID) | ORAL | 1 refills | Status: DC | PRN

## 2017-10-07 NOTE — Assessment & Plan Note (Signed)
See above for details.  Still drinking 1 beer and 4 ounces of bourbon daily.

## 2017-10-07 NOTE — Assessment & Plan Note (Signed)
Resolved

## 2017-10-07 NOTE — Assessment & Plan Note (Signed)
His heart rate is still minimally elevated though less than goal of 110 bpm. -I have increased his metoprolol to 25 mg a day. -He has follow-up scheduled in 2 weeks with cardiology.   -Continue losartan plus Aldactone.

## 2017-10-07 NOTE — Progress Notes (Signed)
Subjective:    Juan [name] is a 67 y.o. male who presents to [facility] today for hospital FU:  1.  Hospital FU: Patient recently admitted for initially dehydration and diarrhea found to have atrial fibrillation with RVR.  Also had echo which showed reduced ejection fraction to 30-35%.  He has been experiencing difficulties with dyspnea for several weeks prior to admission.  He was started on beta-blocker which was metoprolol succinate.  Also on losartan plus spironolactone for decreased ejection fraction.  Thought was that atrial fibrillation was secondary to heart failure exacerbation secondary to alcohol dependence.  Declined any assistance with stopping alcohol use.  Since leaving the hospital he is felt "very weak."  He has been on the hospital for about 3 days now.  He has noted increased urinary frequency since starting his Spironolactone.  He is up 2-3 times a night with nocturia plus difficulty going back to sleep.  He is taking 12.5 mg of metoprolol.  He does not have any palpitations or chest pain.  From a breathing standpoint: He feels dyspneic even with minimal exertion such as getting out of bed.  He has a cough that is worse in the morning.  It is productive of thick sputum.  He has had no fevers or chills.  He is eating and drinking well without nausea or vomiting.  He does have a pulse oximeter at home which is at his oxygen saturations are about 95%.  His pulse is running in the low 100s fairly consistently.  No lower extremity edema.  He is drinking 1 beer in the morning and 4 ounces of bourbon in the afternoon/evening for his bed.  He averages 1-2 soft stools per day without any overt diarrhea.  ROS as above per HPI.    The following portions of the patient's history were reviewed and updated as appropriate: allergies, current medications, past medical history, family and social history, and problem list. Patient is a current smoker.    PMH reviewed.  Past Medical History:   Diagnosis Date  . Alcoholism (HCC)   . Allergy    seasonal   . Anxiety   . Asthma   . CHF (congestive heart failure) (HCC) 10/2010   ECHO:  EF 40%, Grade II diastolic dysfunction  . Chronic diastolic heart failure (HCC)   . COPD (chronic obstructive pulmonary disease) (HCC)   . COPD exacerbation (HCC)   . Depression   . Headache   . Hyperlipidemia   . Hypertension   . Lung nodule seen on imaging study 06/15/2013   Needs follow-up in 3 months (early 2017)   . RBBB 11/09/2012  . Seizures (HCC)   . Tremor 07/30/2011   Likely secondary to long-term alcohol abuse. Did discuss how his tremors affecting his alcoholism. We also switching her from albuterol to Atrovent in case this is causing any worsening of his tremor.   Marland Kitchen Umbilical hernia 08/06/2012   Past Surgical History:  Procedure Laterality Date  . TOENAIL EXCISION Right 1967   Removal of ingrown toenail [Other]    Medications reviewed. Current Outpatient Medications  Medication Sig Dispense Refill  . acetaminophen (TYLENOL) 325 MG tablet Take 2 tablets (650 mg total) by mouth every 6 (six) hours as needed for moderate pain. 30 tablet 0  . albuterol (PROVENTIL) (2.5 MG/3ML) 0.083% nebulizer solution INHALE 1 VIAL VIA NEBULIZER EVERY 6 HOURS AS NEEDED FOR WHEEZING OR SHORTNESS OF BREATH 375 mL 1  . aspirin 81 MG chewable tablet Chew 1 tablet (  81 mg total) by mouth daily. 30 tablet 0  . atorvastatin (LIPITOR) 40 MG tablet Take 1 tablet (40 mg total) by mouth daily. 90 tablet 3  . budesonide (PULMICORT) 0.5 MG/2ML nebulizer solution Take 2 mLs (0.5 mg total) by nebulization 2 (two) times daily. 2 mL 3  . calcium citrate (CALCITRATE - DOSED IN MG ELEMENTAL CALCIUM) 950 MG tablet Take 2 tablets (400 mg of elemental calcium total) by mouth daily. 60 tablet 0  . Cholecalciferol 1000 units tablet Take 1 tablet (1,000 Units total) by mouth daily. 30 tablet 0  . clonazePAM (KLONOPIN) 0.5 MG tablet Take 1 tablet (0.5 mg total) by mouth at  bedtime. 30 tablet 0  . clonazePAM (KLONOPIN) 1 MG tablet Take 1 tablet (1 mg total) by mouth every morning. 30 tablet 0  . EPINEPHrine Base (PRIMATENE MIST IN) Inhale 1 puff into the lungs as needed (for breathing).    Marland Kitchen guaiFENesin (MUCINEX) 600 MG 12 hr tablet Take 600 mg by mouth 2 (two) times daily as needed for cough or to loosen phlegm.    Marland Kitchen ipratropium (ATROVENT) 0.02 % nebulizer solution Take 2.5 mLs (0.5 mg total) by nebulization 4 (four) times daily. Please dispense QS x 1 month 62.5 mL 3  . Ipratropium-Albuterol (COMBIVENT RESPIMAT) 20-100 MCG/ACT AERS respimat INHALE 1-2 PUFFS INTO THE LUNGS 6 (SIX) TIMES DAILY AS NEEDED FOR WHEEZING.  Please dispense 2 inhalers per month (Patient taking differently: Inhale 1-2 puffs into the lungs See admin instructions. INHALE 1-2 PUFFS INTO THE LUNGS 6 (SIX) TIMES DAILY AS NEEDED FOR WHEEZING.  Please dispense 2 inhalers per month) 4 g 6  . losartan (COZAAR) 50 MG tablet Take 1 tablet (50 mg total) by mouth daily. 30 tablet 0  . metoprolol succinate (TOPROL-XL) 25 MG 24 hr tablet Take 0.5 tablets (12.5 mg total) by mouth daily. 30 tablet 0  . Multiple Vitamin (MULTIVITAMIN WITH MINERALS) TABS tablet Take 1 tablet by mouth daily as needed (for vitamin).     . phenylephrine (4-WAY FAST ACTING) 1 % nasal spray Place 1 drop into both nostrils every 6 (six) hours as needed for congestion (4 way nasal spray).    . sodium chloride (OCEAN) 0.65 % SOLN nasal spray Place 1 spray into both nostrils as needed for congestion.    Marland Kitchen spironolactone (ALDACTONE) 25 MG tablet Take 0.5 tablets (12.5 mg total) by mouth daily. 30 tablet 0  . venlafaxine XR (EFFEXOR-XR) 150 MG 24 hr capsule TAKE 1 CAPSULE ONCE DAILY WITH BREAKFAST. (Patient taking differently: Take 150 mg by mouth daily with breakfast. ) 90 capsule 1   No current facility-administered medications for this visit.      Objective:   Physical Exam BP 138/84   Pulse 100   Temp 98.4 F (36.9 C) (Oral)    Ht 5\' 8"  (1.727 m)   Wt 198 lb 6.4 oz (90 kg)   SpO2 95%   BMI 30.17 kg/m  Gen:  Alert, cooperative patient who appears stated age in no acute distress.  Vital signs reviewed. HEENT: EOMI,  MMM Cardiac:  Regular rate and rhythm.  Heart rate in the high 90s. Pulm: Kyphosis noted.  No wheezing throughout lungs.  No increased work of breathing. Abd:  Soft/nondistended/nontender.  Large umbilical hernia is nontender. Exts: Non edematous BL  LE, warm and well perfused. Neuro: Fine tremor noted  Entire visit took 50 minutes of face time with patient reviewing his labs, hospital stay, discussing alcohol cessation.

## 2017-10-07 NOTE — Assessment & Plan Note (Signed)
No signs of overt fluid overload today. -He is on good heart failure medication regimen. -We are going to see how his EF improves over the next 6 months. -If it does not improve we will discuss at that point need for possible ICD.  He has follow-up already scheduled with cardiology.

## 2017-10-07 NOTE — Assessment & Plan Note (Signed)
Ongoing issue for the patient. -I am referring him today to pulmonology. -I am also going to refer him to cardiopulmonary rehab for help with his acute heart failure. -He would benefit from a sleep study.  He did use CPAP on the hospital which she states greatly helped him.

## 2017-10-07 NOTE — Patient Instructions (Addendum)
I am referring you to pulmonology for a sleep study for CPAP machine to use at nighttime.  I am also going to refer you to cardiopulmonary rehab to help with your strength and breathing.  Like we talked about start taking about medications to help with cutting back on alcohol.  I think this is the best thing you can do for your overall, long-term health.  We will also talk about a colonoscopy coming up soon.  Increase your metoprolol to 25 mg which is 1 pill once a day.  Come back and see me in about 2 weeks.  We will see how you're doing that point on the increased dose of metoprolol.  We are checking some labs today as well.

## 2017-10-07 NOTE — Assessment & Plan Note (Signed)
Issue for this visit his overall health. -We did discuss starting a medication on naltrexone. -He would like to talk to his wife, was present with him today. -We will discuss further at next visit.

## 2017-10-08 LAB — BASIC METABOLIC PANEL
BUN/Creatinine Ratio: 9 — ABNORMAL LOW (ref 10–24)
BUN: 10 mg/dL (ref 8–27)
CO2: 19 mmol/L — ABNORMAL LOW (ref 20–29)
CREATININE: 1.14 mg/dL (ref 0.76–1.27)
Calcium: 10.1 mg/dL (ref 8.6–10.2)
Chloride: 93 mmol/L — ABNORMAL LOW (ref 96–106)
GFR, EST AFRICAN AMERICAN: 78 mL/min/{1.73_m2} (ref >59)
GFR, EST NON AFRICAN AMERICAN: 67 mL/min/{1.73_m2} (ref >59)
Glucose: 132 mg/dL — ABNORMAL HIGH (ref 65–99)
POTASSIUM: 5 mmol/L (ref 3.5–5.2)
SODIUM: 133 mmol/L — AB (ref 134–144)

## 2017-10-08 LAB — CBC
HEMOGLOBIN: 13.4 g/dL (ref 13.0–17.7)
Hematocrit: 39.7 % (ref 37.5–51.0)
MCH: 30.1 pg (ref 26.6–33.0)
MCHC: 33.8 g/dL (ref 31.5–35.7)
MCV: 89 fL (ref 79–97)
PLATELETS: 354 10*3/uL (ref 150–450)
RBC: 4.45 x10E6/uL (ref 4.14–5.80)
RDW: 14.1 % (ref 12.3–15.4)
WBC: 10.6 10*3/uL (ref 3.4–10.8)

## 2017-10-08 LAB — MAGNESIUM: MAGNESIUM: 1.7 mg/dL (ref 1.6–2.3)

## 2017-10-09 NOTE — Addendum Note (Signed)
Addended byGwendolyn Grant, Bemnet Trovato H on: 10/09/2017 12:13 PM   Modules accepted: Orders

## 2017-10-10 ENCOUNTER — Telehealth: Payer: Self-pay | Admitting: Family Medicine

## 2017-10-10 NOTE — Telephone Encounter (Signed)
Called and left voicemail to call clinic back.

## 2017-10-14 DIAGNOSIS — R69 Illness, unspecified: Secondary | ICD-10-CM | POA: Diagnosis not present

## 2017-10-15 NOTE — Telephone Encounter (Signed)
Letter sent with results

## 2017-10-19 ENCOUNTER — Emergency Department (HOSPITAL_COMMUNITY)
Admission: EM | Admit: 2017-10-19 | Discharge: 2017-10-20 | Disposition: A | Discharge status: Home / Self Care | Payer: Medicare HMO | Attending: Emergency Medicine | Admitting: Emergency Medicine

## 2017-10-19 ENCOUNTER — Encounter (HOSPITAL_COMMUNITY): Payer: Self-pay | Admitting: Obstetrics and Gynecology

## 2017-10-19 DIAGNOSIS — F332 Major depressive disorder, recurrent severe without psychotic features: Secondary | ICD-10-CM | POA: Insufficient documentation

## 2017-10-19 DIAGNOSIS — F4321 Adjustment disorder with depressed mood: Secondary | ICD-10-CM

## 2017-10-19 DIAGNOSIS — Z79899 Other long term (current) drug therapy: Secondary | ICD-10-CM | POA: Diagnosis not present

## 2017-10-19 DIAGNOSIS — I10 Essential (primary) hypertension: Secondary | ICD-10-CM | POA: Diagnosis not present

## 2017-10-19 DIAGNOSIS — R69 Illness, unspecified: Secondary | ICD-10-CM | POA: Diagnosis not present

## 2017-10-19 DIAGNOSIS — I5032 Chronic diastolic (congestive) heart failure: Secondary | ICD-10-CM | POA: Diagnosis not present

## 2017-10-19 DIAGNOSIS — J449 Chronic obstructive pulmonary disease, unspecified: Secondary | ICD-10-CM | POA: Diagnosis not present

## 2017-10-19 DIAGNOSIS — F17210 Nicotine dependence, cigarettes, uncomplicated: Secondary | ICD-10-CM | POA: Insufficient documentation

## 2017-10-19 DIAGNOSIS — R45851 Suicidal ideations: Secondary | ICD-10-CM

## 2017-10-19 DIAGNOSIS — R000 Tachycardia, unspecified: Secondary | ICD-10-CM | POA: Diagnosis not present

## 2017-10-19 DIAGNOSIS — F329 Major depressive disorder, single episode, unspecified: Secondary | ICD-10-CM | POA: Diagnosis present

## 2017-10-19 DIAGNOSIS — R062 Wheezing: Secondary | ICD-10-CM | POA: Diagnosis not present

## 2017-10-19 DIAGNOSIS — I110 Hypertensive heart disease with heart failure: Secondary | ICD-10-CM | POA: Insufficient documentation

## 2017-10-19 DIAGNOSIS — Z7982 Long term (current) use of aspirin: Secondary | ICD-10-CM | POA: Diagnosis not present

## 2017-10-19 DIAGNOSIS — F10920 Alcohol use, unspecified with intoxication, uncomplicated: Secondary | ICD-10-CM

## 2017-10-19 LAB — COMPREHENSIVE METABOLIC PANEL
ALBUMIN: 3.6 g/dL (ref 3.5–5.0)
ALT: 30 U/L (ref 0–44)
ANION GAP: 16 — AB (ref 5–15)
AST: 34 U/L (ref 15–41)
Alkaline Phosphatase: 81 U/L (ref 38–126)
BUN: 13 mg/dL (ref 8–23)
CO2: 19 mmol/L — AB (ref 22–32)
Calcium: 8.1 mg/dL — ABNORMAL LOW (ref 8.9–10.3)
Chloride: 93 mmol/L — ABNORMAL LOW (ref 98–111)
Creatinine, Ser: 0.85 mg/dL (ref 0.61–1.24)
GFR calc Af Amer: >60 mL/min (ref >60)
GFR calc non Af Amer: >60 mL/min (ref >60)
GLUCOSE: 152 mg/dL — AB (ref 70–99)
POTASSIUM: 4.4 mmol/L (ref 3.5–5.1)
Sodium: 128 mmol/L — ABNORMAL LOW (ref 135–145)
TOTAL PROTEIN: 7 g/dL (ref 6.5–8.1)
Total Bilirubin: 0.6 mg/dL (ref 0.3–1.2)

## 2017-10-19 LAB — SALICYLATE LEVEL

## 2017-10-19 LAB — CBC
HCT: 41.3 % (ref 39.0–52.0)
HEMOGLOBIN: 14.7 g/dL (ref 13.0–17.0)
MCH: 31.2 pg (ref 26.0–34.0)
MCHC: 35.6 g/dL (ref 30.0–36.0)
MCV: 87.7 fL (ref 78.0–100.0)
Platelets: 321 10*3/uL (ref 150–400)
RBC: 4.71 MIL/uL (ref 4.22–5.81)
RDW: 14.9 % (ref 11.5–15.5)
WBC: 8.9 10*3/uL (ref 4.0–10.5)

## 2017-10-19 LAB — ACETAMINOPHEN LEVEL

## 2017-10-19 LAB — ETHANOL: ALCOHOL ETHYL (B): 226 mg/dL — AB (ref <10)

## 2017-10-19 MED ORDER — ONDANSETRON HCL 4 MG/2ML IJ SOLN
4.0000 mg | Freq: Once | INTRAMUSCULAR | Status: AC
  Administered 2017-10-19: 4 mg via INTRAVENOUS
  Filled 2017-10-19: qty 2

## 2017-10-19 MED ORDER — SODIUM CHLORIDE 0.9 % IV BOLUS
1000.0000 mL | Freq: Once | INTRAVENOUS | Status: AC
  Administered 2017-10-19: 1000 mL via INTRAVENOUS

## 2017-10-19 MED ORDER — IPRATROPIUM-ALBUTEROL 0.5-2.5 (3) MG/3ML IN SOLN
3.0000 mL | Freq: Once | RESPIRATORY_TRACT | Status: AC
  Administered 2017-10-19: 3 mL via RESPIRATORY_TRACT
  Filled 2017-10-19: qty 3

## 2017-10-19 NOTE — ED Notes (Signed)
Pts wife on the phone states that the pt has alcoholism and he has CHF and COPD. Pt has poor vision and has cataracts. [name] reports pt stated to her that he wants to go to sleep and not wake up.  [name] reports she does not know how much pt drinks a day. Pt pays a person to get his alcohol for him.  Pt had a recent fall.  [name] reports pt has complained of right ear and eye pain.   *[name] reports she has not been giving pt his klonopin due to the increased alcohol [name] reports she has been giving him his heartrate medication, his diuretic, and his losartan at 7pm  [name] reports he has not been attempting to hurt himself besides the drinking

## 2017-10-19 NOTE — ED Notes (Signed)
Pt had an episode of emesis. RN made PA aware and

## 2017-10-19 NOTE — ED Triage Notes (Signed)
Per EMS: Pt is coming in for alcohol intoxication. Pt is reportedly attempting to kill himself by drinking. Pt drinks only alcohol, pt's wife wants the pt IVC'd because she just got her gallbladder out and cannot take care of him.  Pt reports he was not attempting to kill himself but stated to EMS that he wants to die.

## 2017-10-19 NOTE — ED Provider Notes (Signed)
[facility]-EMERGENCY DEPT Provider Note   CSN: [id number] Arrival date & time: 11/15/17  2025     History   Chief Complaint Chief Complaint  Patient presents with  . Alcohol Intoxication  . Suicidal  . IVC'd    HPI Juan [name] is a 67 y.o. male with PMH/o Alcoholism, HTN, Depression who presents for evaluation of alcohol intoxication and suicidal ideation. Patient reports that he has been drinking beer today but doesn't say how much. His wife called EMS because patient was expressing suicidal ideations. Patient reports he wants "to just die and end it all." Patient states that he doesn't have a specific plan but just wants "it to be over and die." Patient yelling and shouting in hallway saying "nobody can help me, you all are helping me." He denies any HI or hallucinations. He denies any CP, SOB, abdominal pain.   The history is provided by the patient. The history is limited by the condition of the patient.    Past Medical History:  Diagnosis Date  . Alcoholism (HCC)   . Allergy    seasonal   . Anxiety   . Asthma   . CHF (congestive heart failure) (HCC) 10/2010   ECHO:  EF 40%, Grade II diastolic dysfunction  . Chronic diastolic heart failure (HCC)   . COPD (chronic obstructive pulmonary disease) (HCC)   . COPD exacerbation (HCC)   . Depression   . Headache   . Hyperlipidemia   . Hypertension   . Lung nodule seen on imaging study 06/15/2013   Needs follow-up in 3 months (early 2017)   . RBBB 11/09/2012  . Seizures (HCC)   . Tremor 07/30/2011   Likely secondary to long-term alcohol abuse. Did discuss how his tremors affecting his alcoholism. We also switching her from albuterol to Atrovent in case this is causing any worsening of his tremor.   Marland Kitchen Umbilical hernia 08/06/2012    Patient Active Problem List   Diagnosis Date Noted  . Pain   . Alcoholic cardiomyopathy (HCC) 60/15/6153  . Excessive physiologic tremor 10/02/2017  . Diarrhea of  presumed infectious origin   . Cataract 06/26/2017  . Shortness of breath 12/16/2014  . Alcohol use disorder, severe, dependence (HCC) 11/14/2014  . Atrial fibrillation with RVR (HCC) 11/14/2014  . Peripheral neuropathy 12/15/2013  . Bronchiectasis without acute exacerbation (HCC) 06/30/2013  . Acute on chronic systolic (congestive) heart failure (HCC) 11/11/2012  . Hyponatremia 11/08/2012  . Umbilical hernia 08/06/2012  . Smoker unmotivated to quit 07/22/2012  . HTN (hypertension) 07/30/2011  . Hepatomegaly 11/05/2010  . Depression 11/05/2010  . Anxiety 11/05/2010    Past Surgical History:  Procedure Laterality Date  . TOENAIL EXCISION Right 1967   Removal of ingrown toenail [Other]        Home Medications    Prior to Admission medications   Medication Sig Start Date End Date Taking? Authorizing Provider  acetaminophen (TYLENOL) 325 MG tablet Take 2 tablets (650 mg total) by mouth every 6 (six) hours as needed for moderate pain. 11/21/14  Yes Jeralyn Bennett, MD  albuterol (PROVENTIL) (2.5 MG/3ML) 0.083% nebulizer solution INHALE 1 VIAL VIA NEBULIZER EVERY 6 HOURS AS NEEDED FOR WHEEZING OR SHORTNESS OF BREATH Patient taking differently: Take 2.5 mg by nebulization every 6 (six) hours as needed for wheezing or shortness of breath.  07/05/17  Yes Diallo, Abdoulaye, MD  aspirin 81 MG chewable tablet Chew 1 tablet (81 mg total) by mouth daily. 12/20/15  Yes Mayo,  Allyn Kenner, MD  atorvastatin (LIPITOR) 40 MG tablet Take 1 tablet (40 mg total) by mouth daily. 04/10/17  Yes Tobey Grim, MD  budesonide (PULMICORT) 0.5 MG/2ML nebulizer solution Take 2 mLs (0.5 mg total) by nebulization 2 (two) times daily. Patient taking differently: Take 0.5 mg by nebulization 2 (two) times daily as needed (SOB, wheezing).  01/15/17  Yes Tobey Grim, MD  calcium citrate (CALCITRATE - DOSED IN MG ELEMENTAL CALCIUM) 950 MG tablet Take 2 tablets (400 mg of elemental calcium total) by mouth daily.  10/04/17  Yes Meccariello, Solmon Ice, DO  Cholecalciferol 1000 units tablet Take 1 tablet (1,000 Units total) by mouth daily. 10/04/17  Yes Meccariello, Solmon Ice, DO  clonazePAM (KLONOPIN) 0.5 MG tablet Take 1 tablet (0.5 mg total) by mouth 3 (three) times daily as needed for anxiety. 10/07/17  Yes Tobey Grim, MD  EPINEPHrine Base (PRIMATENE MIST IN) Inhale 1 puff into the lungs as needed (for breathing).   Yes [provider]  guaiFENesin (MUCINEX) 600 MG 12 hr tablet Take 600 mg by mouth 2 (two) times daily as needed for cough or to loosen phlegm.   Yes [provider]  ipratropium (ATROVENT) 0.02 % nebulizer solution Take 2.5 mLs (0.5 mg total) by nebulization 4 (four) times daily. Please dispense QS x 1 month Patient taking differently: Take 0.5 mg by nebulization every 6 (six) hours as needed for wheezing or shortness of breath. Please dispense QS x 1 month 04/08/17  Yes Tobey Grim, MD  Ipratropium-Albuterol (COMBIVENT RESPIMAT) 20-100 MCG/ACT AERS respimat INHALE 1-2 PUFFS INTO THE LUNGS 6 (SIX) TIMES DAILY AS NEEDED FOR WHEEZING.  Please dispense 2 inhalers per month Patient taking differently: Inhale 1-2 puffs into the lungs See admin instructions. INHALE 1-2 PUFFS INTO THE LUNGS 6 (SIX) TIMES DAILY AS NEEDED FOR WHEEZING.  Please dispense 2 inhalers per month 04/08/17  Yes Tobey Grim, MD  losartan (COZAAR) 50 MG tablet Take 1 tablet (50 mg total) by mouth daily. 10/04/17  Yes Meccariello, Solmon Ice, DO  metoprolol succinate (TOPROL-XL) 25 MG 24 hr tablet Take 0.5 tablets (12.5 mg total) by mouth daily. Patient taking differently: Take 25 mg by mouth daily.  10/04/17  Yes Meccariello, Solmon Ice, DO  Multiple Vitamin (MULTIVITAMIN WITH MINERALS) TABS tablet Take 1 tablet by mouth daily as needed (for vitamin).    Yes [provider]  phenylephrine (4-WAY FAST ACTING) 1 % nasal spray Place 1 drop into both nostrils every 6 (six) hours as needed for congestion (4  way nasal spray).   Yes [provider]  sodium chloride (OCEAN) 0.65 % SOLN nasal spray Place 1 spray into both nostrils as needed for congestion.   Yes [provider]  spironolactone (ALDACTONE) 25 MG tablet Take 0.5 tablets (12.5 mg total) by mouth daily. 10/04/17  Yes Meccariello, Solmon Ice, DO  venlafaxine XR (EFFEXOR-XR) 150 MG 24 hr capsule TAKE 1 CAPSULE ONCE DAILY WITH BREAKFAST. Patient taking differently: Take 150 mg by mouth daily with breakfast.  04/14/17  Yes Tobey Grim, MD  albuterol (PROVENTIL,VENTOLIN) 90 MCG/ACT inhaler Inhale 2 puffs into the lungs every 6 (six) hours as needed for wheezing. 01/03/11 04/22/11  Tobey Grim, MD    Family History Family History  Problem Relation Age of Onset  . Alzheimer's disease Mother   . Diabetes Mother   . Heart disease Maternal Uncle     Social History Social History   Tobacco Use  .  Smoking status: Current Every Day Smoker    Packs/day: 1.50    Years: 40.00    Pack years: 60.00    Types: Cigarettes  . Smokeless tobacco: Never Used  . Tobacco comment: a little more than 1 ppd (02/17/14)  Substance Use Topics  . Alcohol use: Yes    Comment: 1/2 galloon liquior in 2 days     12/28/2014 wife reports 32oz / day  . Drug use: No     Allergies   Coreg [carvedilol] and Aspartame and phenylalanine   Review of Systems Review of Systems  Respiratory: Negative for shortness of breath.   Cardiovascular: Negative for chest pain.  Gastrointestinal: Negative for abdominal pain.  Psychiatric/Behavioral: Positive for suicidal ideas.  All other systems reviewed and are negative.    Physical Exam Updated Vital Signs BP (!) 150/92 (BP Location: Right Arm)   Pulse (!) 106   Temp 97.6 F (36.4 C) (Oral)   Resp (!) 27   Ht 5\' 8"  (1.727 m)   Wt 86.2 kg   SpO2 98%   BMI 28.89 kg/m   Physical Exam  Constitutional: He is oriented to person, place, and time. He appears well-developed and  well-nourished.  Appears intoxicated   HENT:  Head: Normocephalic and atraumatic.  Mouth/Throat: Oropharynx is clear and moist and mucous membranes are normal.  Eyes: Pupils are equal, round, and reactive to light. Conjunctivae, EOM and lids are normal.  Neck: Full passive range of motion without pain.  Cardiovascular: Normal rate, regular rhythm, normal heart sounds and normal pulses. Exam reveals no gallop and no friction rub.  No murmur heard. Pulmonary/Chest: Effort normal. He has wheezes.  No evidence of respiratory distress. Able to speak in full sentences without difficulty.   Abdominal: Soft. Normal appearance. There is no tenderness. There is no rigidity and no guarding.  Abdomen is soft, non-distended, non-tender. No rigidity, No guarding. No peritoneal signs.  Musculoskeletal: Normal range of motion.  Neurological: He is alert and oriented to person, place, and time.  Moving all extremities Answers questions spontaneously.   Skin: Skin is warm and dry. Capillary refill takes less than 2 seconds.  Psychiatric: He has a normal mood and affect. His speech is normal.  Nursing note and vitals reviewed.    ED Treatments / Results  Labs (all labs ordered are listed, but only abnormal results are displayed) Labs Reviewed  COMPREHENSIVE METABOLIC PANEL - Abnormal; Notable for the following components:      Result Value   Sodium 128 (*)    Chloride 93 (*)    CO2 19 (*)    Glucose, Bld 152 (*)    Calcium 8.1 (*)    Anion gap 16 (*)    All other components within normal limits  ETHANOL - Abnormal; Notable for the following components:   Alcohol, Ethyl (B) 226 (*)    All other components within normal limits  ACETAMINOPHEN LEVEL - Abnormal; Notable for the following components:   Acetaminophen (Tylenol), Serum <10 (*)    All other components within normal limits  SALICYLATE LEVEL  CBC  RAPID URINE DRUG SCREEN, HOSP PERFORMED    EKG None  Radiology No results  found.  Procedures Procedures (including critical care time)  Medications Ordered in ED Medications  LORazepam (ATIVAN) injection 0-4 mg (has no administration in time range)    Or  LORazepam (ATIVAN) tablet 0-4 mg (has no administration in time range)  LORazepam (ATIVAN) injection 0-4 mg (has no administration in time  range)    Or  LORazepam (ATIVAN) tablet 0-4 mg (has no administration in time range)  thiamine (VITAMIN B-1) tablet 100 mg (has no administration in time range)    Or  thiamine (B-1) injection 100 mg (has no administration in time range)  ipratropium-albuterol (DUONEB) 0.5-2.5 (3) MG/3ML nebulizer solution 3 mL (3 mLs Nebulization Given 2017-11-05 2123)  ondansetron (ZOFRAN) injection 4 mg (4 mg Intravenous Given 05-Nov-2017 2215)  sodium chloride 0.9 % bolus 1,000 mL (0 mLs Intravenous Stopped 2017/11/05 2316)     Initial Impression / Assessment and Plan / ED Course  I have reviewed the triage vital signs and the nursing notes.  Pertinent labs & imaging results that were available during my care of the patient were reviewed by me and considered in my medical decision making (see chart for details).     67 y.o. male with past medical history of alcohol intoxication who presents for evaluation of suicidal ideation.  Does endorse drinking but will not tell me how much.  Patient is vague about his suicidal ideations but just keeps repeating "I want it all to end, I want to die."  He was making these claims to his wife who called EMS for concern of suicidal ideations.  She just had surgery and is unable to drive and cannot fill out IVC paperwork but expressed concern to EMS about patient hurting himself. Patient is afebrile, non-toxic appearing, sitting comfortably on examination table. Vital signs reviewed and stable.  Plan for medical clearance labs, TTS consultation.  Patient became upset and attempted to leave.  I attempted to discuss with patient and instructed him that since he  was intoxicated, it would be best for him to stay and let us evaluate him.  Additionally, I expressed my concerns about his claims of wanting to die and that they needed further evaluation of TTS.  Patient stated that "he was going to get out of here because nobody could help him."  IVC paperwork was initiated.  Ethanol level 226.  Salicylate and acetaminophen level unremarkable.  Urine drug screen negative for any acute abnormalities.  CMP shows some slight hydration with bicarb of 19, anion gap of 16.  This is likely related to his EtOH use.  CBC without any significant leukocytosis or anemia.  Patient is pending TTS consult.  Patient signed out to Sharilyn Sites, PA-C with TTS pending.  Patient's IVC paperwork in place.  Please see note for further ED course.   Final Clinical Impressions(s) / ED Diagnoses   Final diagnoses:  Alcoholic intoxication without complication [facility])  Suicidal ideations    ED Discharge Orders    None       Maxwell Caul, PA-C 10/20/17 0152    Melene Plan, DO 10/20/17 1229

## 2017-10-19 NOTE — ED Notes (Signed)
Patient yelling at writer, stating "I need to go I'm not staying here". Mardella Layman, [place] made aware.

## 2017-10-19 NOTE — ED Notes (Signed)
Bed: [facility] Expected date:  Expected time:  Means of arrival:  Comments: ETOH 

## 2017-10-19 NOTE — ED Notes (Signed)
Patient stating he can't breathe, patient moved himself up in bed and states he can now breathe better. Oxygen stating at 96% continuously, RN is aware.

## 2017-10-19 NOTE — ED Notes (Signed)
[phone] [name] wife)

## 2017-10-19 NOTE — ED Notes (Signed)
Pt screaming in hall "Help me" but when RN and PA try to help he yells at [place] and says we "are rude" for asking questions and he wants to speak to my supervior

## 2017-10-20 ENCOUNTER — Other Ambulatory Visit: Payer: Self-pay

## 2017-10-20 ENCOUNTER — Encounter (HOSPITAL_COMMUNITY): Payer: Self-pay

## 2017-10-20 ENCOUNTER — Emergency Department (HOSPITAL_COMMUNITY): Payer: Medicare HMO

## 2017-10-20 ENCOUNTER — Emergency Department (HOSPITAL_COMMUNITY)
Admission: EM | Admit: 2017-10-20 | Discharge: 2017-10-20 | Disposition: A | Discharge status: Home / Self Care | Payer: Medicare HMO | Source: Home / Self Care | Attending: Emergency Medicine | Admitting: Emergency Medicine

## 2017-10-20 DIAGNOSIS — R45850 Homicidal ideations: Secondary | ICD-10-CM | POA: Diagnosis not present

## 2017-10-20 DIAGNOSIS — F419 Anxiety disorder, unspecified: Secondary | ICD-10-CM | POA: Insufficient documentation

## 2017-10-20 DIAGNOSIS — Z79899 Other long term (current) drug therapy: Secondary | ICD-10-CM

## 2017-10-20 DIAGNOSIS — R0689 Other abnormalities of breathing: Secondary | ICD-10-CM | POA: Diagnosis not present

## 2017-10-20 DIAGNOSIS — R0602 Shortness of breath: Secondary | ICD-10-CM | POA: Diagnosis not present

## 2017-10-20 DIAGNOSIS — F1010 Alcohol abuse, uncomplicated: Secondary | ICD-10-CM | POA: Insufficient documentation

## 2017-10-20 DIAGNOSIS — F329 Major depressive disorder, single episode, unspecified: Secondary | ICD-10-CM

## 2017-10-20 DIAGNOSIS — Z7982 Long term (current) use of aspirin: Secondary | ICD-10-CM | POA: Insufficient documentation

## 2017-10-20 DIAGNOSIS — J45909 Unspecified asthma, uncomplicated: Secondary | ICD-10-CM

## 2017-10-20 DIAGNOSIS — F17210 Nicotine dependence, cigarettes, uncomplicated: Secondary | ICD-10-CM | POA: Insufficient documentation

## 2017-10-20 DIAGNOSIS — I5032 Chronic diastolic (congestive) heart failure: Secondary | ICD-10-CM | POA: Insufficient documentation

## 2017-10-20 DIAGNOSIS — I110 Hypertensive heart disease with heart failure: Secondary | ICD-10-CM

## 2017-10-20 DIAGNOSIS — F4321 Adjustment disorder with depressed mood: Secondary | ICD-10-CM | POA: Diagnosis present

## 2017-10-20 DIAGNOSIS — J449 Chronic obstructive pulmonary disease, unspecified: Secondary | ICD-10-CM | POA: Diagnosis not present

## 2017-10-20 DIAGNOSIS — M2550 Pain in unspecified joint: Secondary | ICD-10-CM | POA: Diagnosis not present

## 2017-10-20 DIAGNOSIS — Z7401 Bed confinement status: Secondary | ICD-10-CM | POA: Diagnosis not present

## 2017-10-20 DIAGNOSIS — R000 Tachycardia, unspecified: Secondary | ICD-10-CM | POA: Diagnosis not present

## 2017-10-20 DIAGNOSIS — R69 Illness, unspecified: Secondary | ICD-10-CM | POA: Diagnosis not present

## 2017-10-20 LAB — RAPID URINE DRUG SCREEN, HOSP PERFORMED
AMPHETAMINES: NOT DETECTED
Barbiturates: NOT DETECTED
Benzodiazepines: NOT DETECTED
COCAINE: NOT DETECTED
OPIATES: NOT DETECTED
Tetrahydrocannabinol: NOT DETECTED

## 2017-10-20 MED ORDER — LORAZEPAM 2 MG/ML IJ SOLN
0.0000 mg | Freq: Four times a day (QID) | INTRAMUSCULAR | Status: DC
  Administered 2017-10-20 (×2): 2 mg via INTRAVENOUS
  Filled 2017-10-20 (×2): qty 1

## 2017-10-20 MED ORDER — IPRATROPIUM-ALBUTEROL 0.5-2.5 (3) MG/3ML IN SOLN
3.0000 mL | Freq: Once | RESPIRATORY_TRACT | Status: AC
  Administered 2017-10-20: 3 mL via RESPIRATORY_TRACT
  Filled 2017-10-20: qty 3

## 2017-10-20 MED ORDER — LORAZEPAM 1 MG PO TABS: 0.0000 mg | ORAL_TABLET | Freq: Four times a day (QID) | ORAL | Status: DC

## 2017-10-20 MED ORDER — THIAMINE HCL 100 MG/ML IJ SOLN
100.0000 mg | Freq: Every day | INTRAMUSCULAR | Status: DC
  Administered 2017-10-20: 100 mg via INTRAVENOUS
  Filled 2017-10-20: qty 2

## 2017-10-20 MED ORDER — GABAPENTIN 300 MG PO CAPS: 300.0000 mg | ORAL_CAPSULE | Freq: Three times a day (TID) | ORAL | 0 refills | Status: DC

## 2017-10-20 MED ORDER — GABAPENTIN 300 MG PO CAPS
300.0000 mg | ORAL_CAPSULE | Freq: Three times a day (TID) | ORAL | Status: DC
  Administered 2017-10-20: 300 mg via ORAL
  Filled 2017-10-20: qty 1

## 2017-10-20 MED ORDER — LORAZEPAM 1 MG PO TABS: 0.0000 mg | ORAL_TABLET | Freq: Two times a day (BID) | ORAL | Status: DC

## 2017-10-20 MED ORDER — LORAZEPAM 1 MG PO TABS
1.0000 mg | ORAL_TABLET | Freq: Once | ORAL | Status: AC
  Administered 2017-10-20: 1 mg via ORAL
  Filled 2017-10-20: qty 1

## 2017-10-20 MED ORDER — VITAMIN B-1 100 MG PO TABS: 100.0000 mg | ORAL_TABLET | Freq: Every day | ORAL | Status: DC

## 2017-10-20 MED ORDER — LORAZEPAM 2 MG/ML IJ SOLN: 0.0000 mg | Freq: Two times a day (BID) | INTRAMUSCULAR | Status: DC

## 2017-10-20 NOTE — BH Assessment (Signed)
[facility] Assessment Progress Note Case was staffed with Lucianne Muss MD, Cresenciano Genre who also evaluated patient and recommended patient be discharged later this date and follow up with his upcoming MD appointment.

## 2017-10-20 NOTE — BH Assessment (Signed)
Received TTS consult and spoke to Job Founds, RN who said Pt cannot participate in assessment at this time due to intoxication. [facility] will contact TTS when Pt is able to participate.   Harlin Rain Patsy [place], LPC, [facility], [facility] Triage Specialist [phone]

## 2017-10-20 NOTE — ED Notes (Signed)
[facility] called for patient transport home.  

## 2017-10-20 NOTE — ED Notes (Signed)
Bed: [facility]23 Expected date:  Expected time:  Means of arrival:  Comments: Room 2 

## 2017-10-20 NOTE — Progress Notes (Signed)
CSW consulted to speak with wife before patient is discharged. CSW spoke with wife, Payden Vallely [phone], regarding patient disposition. Wife expressed concern with patient being discharged due to patient needing assistance with his alcoholism. CSW offered to give patient substance use resources to which wife declined stating she wanted more than a piece of paper. CSW aware patient is his own legal guardian and is choosing to drink. CSW explained that psychiatry did not feel patient was a danger to himself or others and was not actively psychotic so patient is stable for discharge. Patient's wife began to demand to talk to someone with more authority than CSW. NP agreed to speak with patient's wife and reiterated that patient did not meet inpatient criteria. Per family, they were going to the magistrate to re-IVC patient. CSW has updated RN who has agreed to call [facility] for transport back home.   Archie Balboa, LCSWA  Clinical Social Work Department  [employer]  [phone]

## 2017-10-20 NOTE — ED Provider Notes (Addendum)
[facility]-EMERGENCY DEPT Provider Note   CSN: [id number] Arrival date & time: 10/20/17  1605     History   Chief Complaint Chief Complaint  Patient presents with  . IVC    HPI Juan [name] is a 67 y.o. male.  Patient here with IVC filled out as wife continues to fill out IVC as she is concerned about his alcohol abuse.  Patient is not homicidal, no suicidal ideation, does not want to go to rehab.  Has capacity to make this decision.  Patient has no physical complaints.  He does not endorse any homicidal suicidal ideation and is not clinically intoxicated.  The history is provided by the patient.  Illness  This is a recurrent problem. The current episode started less than 1 hour ago. The problem occurs constantly. The problem has not changed since onset.Pertinent negatives include no chest pain, no abdominal pain, no headaches and no shortness of breath. Nothing aggravates the symptoms. Nothing relieves the symptoms. He has tried nothing for the symptoms. The treatment provided no relief.    Past Medical History:  Diagnosis Date  . Alcoholism (HCC)   . Allergy    seasonal   . Anxiety   . Asthma   . CHF (congestive heart failure) (HCC) 10/2010   ECHO:  EF 40%, Grade II diastolic dysfunction  . Chronic diastolic heart failure (HCC)   . COPD (chronic obstructive pulmonary disease) (HCC)   . COPD exacerbation (HCC)   . Depression   . Headache   . Hyperlipidemia   . Hypertension   . Lung nodule seen on imaging study 06/15/2013   Needs follow-up in 3 months (early 2017)   . RBBB 11/09/2012  . Seizures (HCC)   . Tremor 07/30/2011   Likely secondary to long-term alcohol abuse. Did discuss how his tremors affecting his alcoholism. We also switching her from albuterol to Atrovent in case this is causing any worsening of his tremor.   Marland Kitchen Umbilical hernia 08/06/2012    Patient Active Problem List   Diagnosis Date Noted  . Adjustment disorder with depressed  mood 10/20/2017  . Alcoholic cardiomyopathy (HCC) 72/62/0355  . Excessive physiologic tremor 10/02/2017  . Cataract 06/26/2017  . Alcohol use disorder, severe, dependence (HCC) 11/14/2014  . Atrial fibrillation with RVR (HCC) 11/14/2014  . Peripheral neuropathy 12/15/2013  . Bronchiectasis without acute exacerbation (HCC) 06/30/2013  . Acute on chronic systolic (congestive) heart failure (HCC) 11/11/2012  . Umbilical hernia 08/06/2012  . Smoker unmotivated to quit 07/22/2012  . HTN (hypertension) 07/30/2011  . Hepatomegaly 11/05/2010    Past Surgical History:  Procedure Laterality Date  . TOENAIL EXCISION Right 1967   Removal of ingrown toenail [Other]        Home Medications    Prior to Admission medications   Medication Sig Start Date End Date Taking? Authorizing Provider  albuterol (PROVENTIL) (2.5 MG/3ML) 0.083% nebulizer solution INHALE 1 VIAL VIA NEBULIZER EVERY 6 HOURS AS NEEDED FOR WHEEZING OR SHORTNESS OF BREATH Patient taking differently: Take 2.5 mg by nebulization every 6 (six) hours as needed for wheezing or shortness of breath.  07/05/17  Yes Diallo, Abdoulaye, MD  aspirin 81 MG chewable tablet Chew 1 tablet (81 mg total) by mouth daily. 12/20/15  Yes Mayo, Allyn Kenner, MD  atorvastatin (LIPITOR) 40 MG tablet Take 1 tablet (40 mg total) by mouth daily. 04/10/17  Yes Tobey Grim, MD  budesonide (PULMICORT) 0.5 MG/2ML nebulizer solution Take 2 mLs (0.5 mg total) by  nebulization 2 (two) times daily. Patient taking differently: Take 0.5 mg by nebulization 2 (two) times daily as needed (SOB, wheezing).  01/15/17  Yes Tobey Grim, MD  calcium citrate (CALCITRATE - DOSED IN MG ELEMENTAL CALCIUM) 950 MG tablet Take 2 tablets (400 mg of elemental calcium total) by mouth daily. 10/04/17  Yes Meccariello, Solmon Ice, DO  Cholecalciferol 1000 units tablet Take 1 tablet (1,000 Units total) by mouth daily. 10/04/17  Yes Meccariello, Solmon Ice, DO  EPINEPHrine Base (PRIMATENE MIST  IN) Inhale 1 puff into the lungs as needed (for breathing).   Yes [provider]  gabapentin (NEURONTIN) 300 MG capsule Take 1 capsule (300 mg total) by mouth 3 (three) times daily. 10/20/17  Yes Charm Rings, NP  ipratropium (ATROVENT) 0.02 % nebulizer solution Take 2.5 mLs (0.5 mg total) by nebulization 4 (four) times daily. Please dispense QS x 1 month Patient taking differently: Take 0.5 mg by nebulization every 6 (six) hours as needed for wheezing or shortness of breath. Please dispense QS x 1 month 04/08/17  Yes Tobey Grim, MD  Ipratropium-Albuterol (COMBIVENT RESPIMAT) 20-100 MCG/ACT AERS respimat INHALE 1-2 PUFFS INTO THE LUNGS 6 (SIX) TIMES DAILY AS NEEDED FOR WHEEZING.  Please dispense 2 inhalers per month Patient taking differently: Inhale 1-2 puffs into the lungs See admin instructions. INHALE 1-2 PUFFS INTO THE LUNGS 6 (SIX) TIMES DAILY AS NEEDED FOR WHEEZING.  Please dispense 2 inhalers per month 04/08/17  Yes Tobey Grim, MD  metoprolol succinate (TOPROL-XL) 25 MG 24 hr tablet Take 0.5 tablets (12.5 mg total) by mouth daily. Patient taking differently: Take 25 mg by mouth daily.  10/04/17  Yes Meccariello, Solmon Ice, DO  Multiple Vitamin (MULTIVITAMIN WITH MINERALS) TABS tablet Take 1 tablet by mouth daily as needed (for vitamin).    Yes [provider]  phenylephrine (4-WAY FAST ACTING) 1 % nasal spray Place 1 drop into both nostrils every 6 (six) hours as needed for congestion (4 way nasal spray).   Yes [provider]  sodium chloride (OCEAN) 0.65 % SOLN nasal spray Place 1 spray into both nostrils as needed for congestion.   Yes [provider]  guaiFENesin (MUCINEX) 600 MG 12 hr tablet Take 600 mg by mouth 2 (two) times daily as needed for cough or to loosen phlegm.    [provider]  losartan (COZAAR) 50 MG tablet Take 1 tablet (50 mg total) by mouth daily. 10/04/17   Meccariello, Solmon Ice, DO  spironolactone (ALDACTONE) 25 MG  tablet Take 0.5 tablets (12.5 mg total) by mouth daily. 10/04/17   Meccariello, Solmon Ice, DO  venlafaxine XR (EFFEXOR-XR) 150 MG 24 hr capsule TAKE 1 CAPSULE ONCE DAILY WITH BREAKFAST. Patient taking differently: Take 150 mg by mouth daily with breakfast.  04/14/17   Tobey Grim, MD  albuterol (PROVENTIL,VENTOLIN) 90 MCG/ACT inhaler Inhale 2 puffs into the lungs every 6 (six) hours as needed for wheezing. 01/03/11 04/22/11  Tobey Grim, MD    Family History Family History  Problem Relation Age of Onset  . Alzheimer's disease Mother   . Diabetes Mother   . Heart disease Maternal Uncle     Social History Social History   Tobacco Use  . Smoking status: Current Every Day Smoker    Packs/day: 1.50    Years: 40.00    Pack years: 60.00    Types: Cigarettes  . Smokeless tobacco: Never Used  . Tobacco comment: a little more than 1 ppd (  02/17/14)  Substance Use Topics  . Alcohol use: Yes    Comment: 1/2 galloon liquior in 2 days     12/28/2014 wife reports 32oz / day  . Drug use: No     Allergies   Coreg [carvedilol] and Aspartame and phenylalanine   Review of Systems Review of Systems  Constitutional: Negative for chills and fever.  HENT: Negative for ear pain and sore throat.   Eyes: Negative for pain and visual disturbance.  Respiratory: Negative for cough and shortness of breath.   Cardiovascular: Negative for chest pain and palpitations.  Gastrointestinal: Negative for abdominal pain and vomiting.  Genitourinary: Negative for dysuria and hematuria.  Musculoskeletal: Negative for arthralgias and back pain.  Skin: Negative for color change and rash.  Neurological: Negative for seizures, syncope and headaches.  Psychiatric/Behavioral: Negative for suicidal ideas.  All other systems reviewed and are negative.    Physical Exam Updated Vital Signs BP (!) 154/86 (BP Location: Right Arm)   Pulse (!) 136   Temp 98.4 F (36.9 C) (Oral)   Resp (!) 37   SpO2 97%    Physical Exam  Constitutional: He appears well-developed and well-nourished.  HENT:  Head: Normocephalic and atraumatic.  Eyes: Pupils are equal, round, and reactive to light. Conjunctivae and EOM are normal.  Neck: Normal range of motion. Neck supple.  Cardiovascular: Normal rate, regular rhythm, normal heart sounds and intact distal pulses.  No murmur heard. Pulmonary/Chest: Effort normal and breath sounds normal. No respiratory distress.  Abdominal: Soft. There is no tenderness.  Musculoskeletal: Normal range of motion. He exhibits no edema.  Neurological: He is alert.  Skin: Skin is warm and dry.  Psychiatric: He has a normal mood and affect. His speech is normal and behavior is normal. Judgment and thought content normal. Cognition and memory are normal.  Nursing note and vitals reviewed.    ED Treatments / Results  Labs (all labs ordered are listed, but only abnormal results are displayed) Labs Reviewed - No data to display  EKG EKG Interpretation  Date/Time:  Monday October 20 2017 18:58:08 EDT Ventricular Rate:  135 PR Interval:    QRS Duration: 146 QT Interval:  340 QTC Calculation: 510 R Axis:   -95 Text Interpretation:  Sinus tachycardia Multiple premature complexes, vent & supraven RBBB and LAFB Baseline wander in lead(s) V6 Confirmed by Virgina Norfolk [phone]) on 10/20/2017 7:02:55 PM   Radiology Dg Chest 1 View  Result Date: 10/20/2017 CLINICAL DATA:  Alcohol intoxication. History of asthma, COPD, current smoker. EXAM: CHEST  1 VIEW COMPARISON:  Portable chest x-ray of October 01, 2017 FINDINGS: The lungs are adequately inflated and clear. The heart is top-normal in size. The pulmonary vascularity is normal. The mediastinum is normal in width. The bony thorax is unremarkable. IMPRESSION: There is no active cardiopulmonary disease. Electronically Signed   By: David  [place] M.D.   On: 10/20/2017 08:10    Procedures Procedures (including critical care  time)  Medications Ordered in ED Medications  LORazepam (ATIVAN) tablet 1 mg (1 mg Oral Given 10/20/17 1858)     Initial Impression / Assessment and Plan / ED Course  I have reviewed the triage vital signs and the nursing notes.  Pertinent labs & imaging results that were available during my care of the patient were reviewed by me and considered in my medical decision making (see chart for details).     JIMMIE RUETER is a 66 year old male with history of alcohol abuse who presents  to the ED with IVC paperwork.  This is the second time today patient has been here with IVC.  He was evaluated by psychiatry and he was cleared.  Patient does not have any suicidal or homicidal ideation.  Patient does not want any help with alcohol at this time.  He understands his addiction.  He is not a harm to himself or anyone else.  Patient overall well-appearing.  Had breathing treatment on the way to the ED and has overall unremarkable physical exam.  Has no wheezing on exam.  Patient was tachycardic while here earlier in the day.  Continues to be tachycardic in the low 100s to 120s.  Was given albuterol therefore suspect that he is increasingly tachycardic from that.  He does not have any shortness of breath and chest pain.  He feels improved after breathing treatment which she states that he was due for at home.  EKG shows sinus tachycardia but no signs of ischemic changes.  Unchanged from prior.  No concern for ETOH withdrawal. Try to contact patient's wife but was unable to reach her after several attempts.  IVC rescinded and patient discharged from ED in  good condition.  Patient with improved tachycardia prior to discharge.  Has follow up in place. Given rehab resources.   Final Clinical Impressions(s) / ED Diagnoses   Final diagnoses:  ETOH abuse    ED Discharge Orders    None       Virgina Norfolk, DO 10/20/17 1757    Virgina Norfolk, DO 10/20/17 1927

## 2017-10-20 NOTE — ED Notes (Signed)
Patient ambulated to BR with x1 assist. Tolerated well.

## 2017-10-20 NOTE — ED Notes (Signed)
Patient IVC rescinded. Patient wife called this nurse and asked to speak with patient psychiatric provider regarding patient home care. Psych NP Macky Lower called regarding this issue. Was told that the Psych provider would be unable to speak with the patient's wife today. Social worker states she will call the patient's wife.

## 2017-10-20 NOTE — BH Assessment (Addendum)
Assessment Note  Juan [name] is an 67 y.o. male that presents with IVC. Per IVC: "Respondent stating he wants to die and has no point to live. Respondent reports he is "better off dead." Respondent reports ongoing alcohol use." Patient is noted to be impaired on admission with a BAL of 226 and is agitated. Patient's UDS is negative for illicit substances. Patient could not be assessed on admission due to agitation and altered mental state associated with current impairment. Patient is assessed by this writer at 1000 hours and is observed to no longer be impaired although continues to be drowsy and speaks in a slow soft voice. Patient denies any S/I, H/I or AVH. Patient reports ongoing health issues that have worsened in the last six months that triggered this incident. Patient states he was diagnosed with depression "years ago" and is currently prescribed medications from his PCP to assist with symptom management. Patient cannot recall what medications he is currently prescribed although reports he is currently compliant. Patient reports that he has a appointment with Rene Kocher MD (psychiatrist) next week on 10/29/17 because he feels his MH medications are not working as indicated. Patient reports ongoing symptoms of depression to include: feeling useless and hopeless. Patient reports ongoing alcohol use stating he consumes two to three 12 ounce beers three to four times a week with last use prior to admission when patient reported he "had to many beers" and is unclear in reference to amount. Patient denies any thoughts of self harm this date stating he "only said those things because he was drunk" last night. Patient denies any previous attempts/gestures at self harm or prior MH hospitalizations. Patient denies any MH OP care reporting his PCP is currently prescribing his MH medications. Patient is oriented x 4 and does not appear to be responding to any internal stimuli. Per notes this date,   "Patient has a  history of Depression who presents for evaluation of alcohol intoxication and suicidal ideation. Patient reports that he has been drinking beer today but doesn't say how much. His wife called EMS because patient was expressing suicidal ideations. Patient reports he wants "to just die and end it all." Patient states that he doesn't have a specific plan but just wants "it to be over and die." Patient yelling and shouting in hallway saying "nobody can help me, you all are helping me." He denies any HI or hallucinations. Case was staffed with Lucianne Muss MD, Cresenciano Genre who also evaluated patient and recommended patient be discharged later this date with IVC rescinded and follow up with his upcoming MD appointment.   Diagnosis: F33.2 MDD recurrent without psychotic features, severe, Alcohol abuse   Past Medical History:  Past Medical History:  Diagnosis Date  . Alcoholism (HCC)   . Allergy    seasonal   . Anxiety   . Asthma   . CHF (congestive heart failure) (HCC) 10/2010   ECHO:  EF 40%, Grade II diastolic dysfunction  . Chronic diastolic heart failure (HCC)   . COPD (chronic obstructive pulmonary disease) (HCC)   . COPD exacerbation (HCC)   . Depression   . Headache   . Hyperlipidemia   . Hypertension   . Lung nodule seen on imaging study 06/15/2013   Needs follow-up in 3 months (early 2017)   . RBBB 11/09/2012  . Seizures (HCC)   . Tremor 07/30/2011   Likely secondary to long-term alcohol abuse. Did discuss how his tremors affecting his alcoholism. We also switching her from albuterol  to Atrovent in case this is causing any worsening of his tremor.   Marland Kitchen Umbilical hernia 08/06/2012    Past Surgical History:  Procedure Laterality Date  . TOENAIL EXCISION Right 1967   Removal of ingrown toenail [Other]    Family History:  Family History  Problem Relation Age of Onset  . Alzheimer's disease Mother   . Diabetes Mother   . Heart disease Maternal Uncle     Social History:  reports that he has  been smoking cigarettes. He has a 60.00 pack-year smoking history. He has never used smokeless tobacco. He reports that he drinks alcohol. He reports that he does not use drugs.  Additional Social History:  Alcohol / Drug Use Pain Medications: See MAR Prescriptions: See MAR Over the Counter: See MAR History of alcohol / drug use?: Yes Longest period of sobriety (when/how long): Unknown Negative Consequences of Use: (Denies) Withdrawal Symptoms: (Denies) Substance #1 Name of Substance 1: Alcohol  1 - Age of First Use: 23 1 - Amount (size/oz): Patient reports "different amounts" 1 - Frequency: Pt states three to four times a week 1 - Duration: Last "couple years" 1 - Last Use / Amount: 18-Nov-2017 Pt states "a couple beers"   CIWA: CIWA-Ar BP: (!) 171/107 Pulse Rate: (!) 120 Nausea and Vomiting: no nausea and no vomiting Tactile Disturbances: mild itching, pins and needles, burning or numbness Tremor: two Auditory Disturbances: not present Paroxysmal Sweats: no sweat visible Visual Disturbances: not present Anxiety: two Headache, Fullness in Head: moderately severe Agitation: two Orientation and Clouding of Sensorium: oriented and can do serial additions CIWA-Ar Total: 12 COWS:    Allergies:  Allergies  Allergen Reactions  . Coreg [Carvedilol] Shortness Of Breath    Shortness of breath with rechallenge 10/01/17  . Aspartame And Phenylalanine Nausea And Vomiting    Home Medications:  (Not in a hospital admission)  OB/GYN Status:  No LMP for male patient.  General Assessment Data Assessment unable to be completed: Yes Reason for not completing assessment: Pt intoxicated and per RN unable to respond to questions Location of Assessment: [facility] ED TTS Assessment: In system Is this a Tele or Face-to-Face Assessment?: Face-to-Face Is this an Initial Assessment or a Re-assessment for this encounter?: Initial Assessment Patient Accompanied by:: (NA) Language Other than English:  No Living Arrangements: (With wife ) What gender do you identify as?: Male Marital status: Married Jordan name: NA Pregnancy Status: No Living Arrangements: Spouse/significant other Can pt return to current living arrangement?: Yes Admission Status: Involuntary Petitioner: ED Attending Is patient capable of signing voluntary admission?: Yes Referral Source: Self/Family/Friend Insurance type: Scientist, research (physical sciences) Exam [facility] Walk-in ONLY) Medical Exam completed: Yes  Crisis Care Plan Living Arrangements: Spouse/significant other Legal Guardian: (NA) Name of Psychiatrist: None Name of Therapist: None  Education Status Is patient currently in school?: No Is the patient employed, unemployed or receiving disability?: Receiving disability income  Risk to self with the past 6 months Suicidal Ideation: No Has patient been a risk to self within the past 6 months prior to admission? : No Suicidal Intent: No Has patient had any suicidal intent within the past 6 months prior to admission? : No Is patient at risk for suicide?: No Suicidal Plan?: No Has patient had any suicidal plan within the past 6 months prior to admission? : No Access to Means: No What has been your use of drugs/alcohol within the last 12 months?: Current use Previous Attempts/Gestures: No How many times?: 0 Other Self  Harm Risks: (NA) Triggers for Past Attempts: (NA) Intentional Self Injurious Behavior: None Family Suicide History: No Recent stressful life event(s): (Health issues) Persecutory voices/beliefs?: No Depression: Yes Depression Symptoms: Feeling worthless/self pity Substance abuse history and/or treatment for substance abuse?: No Suicide prevention information given to non-admitted patients: Not applicable  Risk to Others within the past 6 months Homicidal Ideation: No Does patient have any lifetime risk of violence toward others beyond the six months prior to admission? : No Thoughts of Harm  to Others: No Current Homicidal Intent: No Current Homicidal Plan: No Access to Homicidal Means: No Identified Victim: NA History of harm to others?: No Assessment of Violence: None Noted Violent Behavior Description: NA Does patient have access to weapons?: No Criminal Charges Pending?: No Does patient have a court date: No Is patient on probation?: No  Psychosis Hallucinations: None noted Delusions: None noted  Mental Status Report Appearance/Hygiene: In scrubs Eye Contact: Fair Motor Activity: Freedom of movement Speech: Logical/coherent Level of Consciousness: Quiet/awake Mood: Depressed Affect: Appropriate to circumstance Anxiety Level: Minimal Thought Processes: Coherent, Relevant Judgement: Partial Orientation: Person, Place, Time Obsessive Compulsive Thoughts/Behaviors: None  Cognitive Functioning Concentration: Normal Memory: Recent Intact, Remote Intact Is patient IDD: No Insight: Good Impulse Control: Fair Appetite: Fair Have you had any weight changes? : No Change Sleep: No Change Total Hours of Sleep: 7 Vegetative Symptoms: None  ADLScreening [facility]) Patient's cognitive ability adequate to safely complete daily activities?: Yes Patient able to express need for assistance with ADLs?: Yes Independently performs ADLs?: Yes (appropriate for developmental age)  Prior Inpatient Therapy Prior Inpatient Therapy: No  Prior Outpatient Therapy Prior Outpatient Therapy: No Does patient have an ACCT team?: No Does patient have Intensive In-House Services?  : No Does patient have [facility] services? : No Does patient have P4CC services?: No  ADL Screening (condition at time of admission) Patient's cognitive ability adequate to safely complete daily activities?: Yes Is the patient deaf or have difficulty hearing?: No Does the patient have difficulty seeing, even when wearing glasses/contacts?: No Does the patient have difficulty  concentrating, remembering, or making decisions?: No Patient able to express need for assistance with ADLs?: Yes Does the patient have difficulty dressing or bathing?: No Independently performs ADLs?: Yes (appropriate for developmental age) Does the patient have difficulty walking or climbing stairs?: Yes Weakness of Legs: None Weakness of Arms/Hands: None  Home Assistive Devices/Equipment Home Assistive Devices/Equipment: None  Therapy Consults (therapy consults require a physician order) PT Evaluation Needed: No OT Evalulation Needed: No SLP Evaluation Needed: No Abuse/Neglect Assessment (Assessment to be complete while patient is alone) Physical Abuse: Denies Verbal Abuse: Denies Sexual Abuse: Denies Exploitation of patient/patient's resources: Denies Self-Neglect: Denies Values / Beliefs Cultural Requests During Hospitalization: None Spiritual Requests During Hospitalization: None Consults Spiritual Care Consult Needed: No Social Work Consult Needed: No Merchant navy officer (For Healthcare) Does Patient Have a Medical Advance Directive?: No Would patient like information on creating a medical advance directive?: No - Patient declined        Disposition: Case was staffed with Lucianne Muss MD, Shaune Pollack DNP who also evaluated patient and recommended patient be discharged later this date and follow up with his upcoming MD appointment.  Disposition Initial Assessment Completed for this Encounter: Yes Disposition of Patient: Discharge Patient refused recommended treatment: No Mode of transportation if patient is discharged?: Loreli Slot)  On Site Evaluation by:   Reviewed with Physician:    Alfredia Ferguson 10/20/2017 10:37 AM

## 2017-10-20 NOTE — ED Triage Notes (Addendum)
Patient BIB EMS after being discharged home at 1330 this afternoon. As patient was being discharged previously, the patient's wife told social work that she would get a second IVC to keep the patient from coming home. IVC paperwork served by [employer]. Patient AxOx4. Denies SI/HI in triage. Patient remains in the paper scrubs from previous visit. EMS administered 5mg  albuterol INH treatment r/t patient wheezing after smoking a cigarette.

## 2017-10-20 NOTE — [facility] Suicide Risk Assessment (Signed)
Suicide Risk Assessment  Discharge Assessment   [facility] Discharge Suicide Risk Assessment   Principal Problem: Adjustment disorder with depressed mood Discharge Diagnoses:  Patient Active Problem List   Diagnosis Date Noted  . Adjustment disorder with depressed mood [F43.21] 10/20/2017    Priority: High  . Alcoholic cardiomyopathy (HCC) [W11.9] 10/02/2017  . Excessive physiologic tremor [G25.0] 10/02/2017  . Cataract [H26.9] 06/26/2017  . Alcohol use disorder, severe, dependence (HCC) [F10.20] 11/14/2014  . Atrial fibrillation with RVR (HCC) [I48.91] 11/14/2014  . Peripheral neuropathy [G62.9] 12/15/2013  . Bronchiectasis without acute exacerbation (HCC) [J47.9] 06/30/2013  . Acute on chronic systolic (congestive) heart failure (HCC) [I50.23] 11/11/2012  . Umbilical hernia [K42.9] 08/06/2012  . Smoker unmotivated to quit [F17.200] 07/22/2012  . HTN (hypertension) [I10] 07/30/2011  . Hepatomegaly [R16.0] 11/05/2010    Total Time spent with patient: 45 minutes  Musculoskeletal: Strength & Muscle Tone: within normal limits Gait & Station: normal Patient leans: N/A  Psychiatric Specialty Exam:   Blood pressure (!) 171/107, pulse (!) 120, temperature 97.6 F (36.4 C), temperature source Oral, resp. rate (!) 27, height 5\' 8"  (1.727 m), weight 86.2 kg, SpO2 97 %.Body mass index is 28.89 kg/m.  General Appearance: Casual  Eye Contact::  Good  Speech:  Normal Rate409  Volume:  Normal  Mood:  Anxious and Depressed, mild  Affect:  Congruent  Thought Process:  Coherent and Descriptions of Associations: Intact  Orientation:  Full (Time, Place, and Person)  Thought Content:  WDL and Logical  Suicidal Thoughts:  No  Homicidal Thoughts:  No  Memory:  Immediate;   Good Recent;   Good Remote;   Good  Judgement:  Fair  Insight:  Fair  Psychomotor Activity:  Normal  Concentration:  Good  Recall:  Good  Fund of Knowledge:Good  Language: Good  Akathisia:  No  Handed:  Right  AIMS  (if indicated):     Assets:  Housing Leisure Time Resilience Social Support  Sleep:     Cognition: WNL  ADL's:  Intact   Mental Status Per Nursing Assessment::   On Admission:   67 yo male who presented to the ED via IVC by his wife because she is having surgery and cannot care for him.  However, he denies suicidal/homicidal ideations, hallucinations, and withdrawal symptoms.  Started on gabapentin to prevent any complications.  He does admit to being stressed with his and his wife's health issues. Stable for discharge, reports an appointment with Dr Rene Kocher this week.  Demographic Factors:  Male and Caucasian  Loss Factors: NA  Historical Factors: NA  Risk Reduction Factors:   Sense of responsibility to family, Living with another person, especially a relative, Positive social support and Positive therapeutic relationship  Continued Clinical Symptoms:  Depression and anxiety, mild  Cognitive Features That Contribute To Risk:  None    Suicide Risk:  Minimal: No identifiable suicidal ideation.  Patients presenting with no risk factors but with morbid ruminations; may be classified as minimal risk based on the severity of the depressive symptoms    Plan Of Care/Follow-up recommendations:  Activity:  as tolerated Diet:  heart healthy diet  LORD, JAMISON, NP 10/20/2017, 11:14 AM

## 2017-10-20 NOTE — ED Notes (Addendum)
Discharge instructions reviewed with patient. Patient verbalizes understanding. EDMD aware of patient tachycardia and tachypnea- patient cleared for discharge. Patient denies CP/SOB/pain. Patient reports "feeling anxious about going home." Patient discharged via [facility].

## 2017-10-20 NOTE — ED Notes (Signed)
[facility] called for patient transport back home. IVC rescinded by EDMD. Patient updated on POC

## 2017-10-20 NOTE — ED Provider Notes (Signed)
  TTS unable to evaluate during the night due to level of intoxication.  Patient placed on CIWA protocol, has been resting comfortably.  Morning team will follow-up on TTS recommendations once completed.   Garlon Hatchet, PA-C 10/20/17 [phone]    Gilda Crease, MD 10/20/17 [phone]

## 2017-10-20 NOTE — ED Notes (Signed)
CXR at patient bedside

## 2017-10-20 NOTE — ED Notes (Addendum)
Patient spouse called at provided numbers by this Clinical research associate. No answer at either cell or home number. Message left stating patient is being discharged and will be transported via [facility]. No personal information left via message. Patient transported home via [facility]. Patient belongings sent home with [facility]

## 2017-10-20 NOTE — ED Notes (Addendum)
Patient is dressed into psych scrubs and is belongings placed in cabinets labeled "[address].

## 2017-10-21 ENCOUNTER — Other Ambulatory Visit: Payer: Self-pay

## 2017-10-21 ENCOUNTER — Telehealth: Payer: Self-pay | Admitting: *Deleted

## 2017-10-21 ENCOUNTER — Ambulatory Visit: Payer: Self-pay | Admitting: Family Medicine

## 2017-10-21 ENCOUNTER — Other Ambulatory Visit: Payer: Self-pay | Admitting: Family Medicine

## 2017-10-21 MED ORDER — IPRATROPIUM-ALBUTEROL 20-100 MCG/ACT IN AERS: 1.0000 | INHALATION_SPRAY | RESPIRATORY_TRACT | 2 refills | Status: DC

## 2017-10-21 MED ORDER — IPRATROPIUM-ALBUTEROL 20-100 MCG/ACT IN AERS: 1.0000 | INHALATION_SPRAY | RESPIRATORY_TRACT | 2 refills | Status: DC | PRN

## 2017-10-21 NOTE — Telephone Encounter (Signed)
Combivent resent. Was set to print. Ples Specter, RN [facility] RN)

## 2017-10-21 NOTE — Telephone Encounter (Signed)
[name] left message on nurse line.  She wanted to check on the status of connivent and she would like Dr. Gwendolyn Grant to call her.  She is very concerned about the ED visit ans what she was told.  She attempted to make an appt but Gwendolyn Grant not available till 11/13/17. Doyal Saric, Maryjo Rochester, CMA

## 2017-10-21 NOTE — Telephone Encounter (Signed)
Wife calls back.  She is very concerned about pt.  He is very anxious and paranoid.  No SI or HI.  She would like to speak with a provider about increasing his anxiety meds.  Spoke with Dr. Manson Passey who will call pt.  Fleeger, Maryjo Rochester, CMA

## 2017-10-21 NOTE — Telephone Encounter (Signed)
Asked by nursing to call patient's wife.  She was expressing some anxiety about caring for Juan [name] on the phone per nursing.  Called patient's wife.  Confirmed with patient that I had permission to speak with wife.  Juan [name] reports that Juan [name] has not had a drink in over 24 hours now. They are at the 26-hour mark.  He is feeling overall anxious.  She is wondering if they can increase his clonazepam.  He currently takes this twice a day.  I recommended that they continue with this current dose.  Recommend that they discuss this with Dr. Gwendolyn Grant.  Reviewed reasons to present to the emergency department including confusion,  change in respiratory status, or change in mental status.  All questions were answered. Provided expectation that symptoms of withdrawal could last days, expect at minimum 3 days, given the severity of his alcohol abuse.  The patient has no history of withdrawal seizures.  They do not need a refill on any medications.  Wife requests call from Dr. Gwendolyn Grant tomorrow at his convenience.   Terisa Starr, MD  Family Medicine Teaching Service    Greater than 25 minutes were spent on the phone with the patient and his wife.

## 2017-10-22 MED ORDER — FUROSEMIDE 40 MG PO TABS: 40.0000 mg | ORAL_TABLET | Freq: Every day | ORAL | 0 refills | Status: DC

## 2017-10-22 NOTE — Telephone Encounter (Signed)
Called and spoke with [place].  Maurya is still not drinking.  She is encouraged by this.  Not tremulous as of yet.  OK to increase klonopin to TID, he is currently taking BID.  However, discussed we cannot manage EtOH w/d over the phone or from afar.  Explicitly explained if any worsening tremors or agitation to bring him to [facility] ED, because concern is that sz's and DTs would follow.    She also mentioned that his weight has increased to 200 lbs.  Last measurement here was 190 lbs.  Increased LE edema.  I will send in 40 mg of lasix to take today and tomorrow AM.  He has FU scheduled with cardiology tomorrow.  Encouraged to keep that appt, unless they are in ED.  She expressed understanding.    He himself continues to refuse any outpatient or inpatient help with his alcoholism.

## 2017-10-22 NOTE — Progress Notes (Signed)
Cardiology Office Note:    Date:  10/23/2017   ID:  Juan [name], DOB 12-29-1950, MRN [id number]  PCP:  Alveda Reasons, MD  Cardiologist:  Pixie Casino, MD   Referring MD: Alveda Reasons, MD   Chief Complaint  Patient presents with  . Hospitalization Follow-up    CHF    History of Present Illness:    Juan [name] is a 67 y.o. male with a hx of HTN, chronic systolic heart failure, atrial fibrillation, alcoholic cardiomyopathy, tobacco abuse, alcoholism, COPD, lung nodules, HLD, HTN, and anxiety. HFrEF improved to 60% on medical therapy in 2014.  He presented to [facility] ED on 09/09/2017 with abdominal pain, nausea, vomiting, and diarrhea.  He was dyspneic, hypertensive, and tachycardic.  He was mentating well without tremor and was discharged without intervention.  He returned to the ER on 09/27/2017 with 3 weeks of profuse watery diarrhea.  He was short of breath and met criteria for sepsis with tachycardia and elevated lactic acid.  He was placed on BiPAP and started IV antibiotics.  Chest x-ray did not show vascular congestion or effusion, C. difficile was negative.  She was noted to be in A. fib RVR and started on a diltiazem drip.  Echocardiogram was completed and showed an LVEF of 30 to 35% and grade 1 diastolic dysfunction.  Cardizem drip was stopped.  He was not anticoagulated because he was unwilling to stop drinking alcohol.  He also reported 3 falls over the past 3 weeks.  He was placed on a beta-blocker for A. fib RVR, but had respiratory distress overnight and was subsequently taken off of his beta-blocker with his underlying COPD.  He was transitioned to 12.5 mg Lopressor twice daily and tolerated this well.  He was also discharged on Aldactone and losartan.  He has since seen his PCP in follow-up.  Dr. Mingo Amber has increased his Toprol to 25 mg daily and kept his spironolactone at 12.5 mg daily.  He has not been on a consistent Lasix regimen.  He reports being  significantly fatigued and weak.  Likely related to his COPD and overall deconditioning. He has tolerated the increased dose of toprol well.  He does report intermittent heart palpitations and rapid heartbeat since being discharged from the hospital.  I suspect that he is having bouts of A. fib RVRCHF contributing to hs fatigue.  Unfortunately, he has not taken any of his morning cardiac medications today.  He has stopped drinking and I congratulated him on this.  He does report abdominal fullness, constipation and diarrhea, and a lower extremity swelling.  He has not been weighing daily.    Past Medical History:  Diagnosis Date  . Alcoholism ([place])   . Allergy    seasonal   . Anxiety   . Asthma   . CHF (congestive heart failure) ([place]) 10/2010   ECHO:  EF 40%, Grade II diastolic dysfunction  . Chronic diastolic heart failure ([place])   . COPD (chronic obstructive pulmonary disease) ([place])   . COPD exacerbation ([place])   . Depression   . Headache   . Hyperlipidemia   . Hypertension   . Lung nodule seen on imaging study 06/15/2013   Needs follow-up in 3 months (early 2017)   . RBBB 11/09/2012  . Seizures ([place])   . Tremor 07/30/2011   Likely secondary to long-term alcohol abuse. Did discuss how his tremors affecting his alcoholism. We also switching her from albuterol to Atrovent in case this  is causing any worsening of his tremor.   Marland Kitchen Umbilical hernia 5/0/5397    Past Surgical History:  Procedure Laterality Date  . TOENAIL EXCISION Right 1967   Removal of ingrown toenail [Other]    Current Medications: Current Meds  Medication Sig  . albuterol (PROVENTIL) (2.5 MG/3ML) 0.083% nebulizer solution INHALE 1 VIAL VIA NEBULIZER EVERY 6 HOURS AS NEEDED FOR WHEEZING OR SHORTNESS OF BREATH (Patient taking differently: Take 2.5 mg by nebulization every 6 (six) hours as needed for wheezing or shortness of breath. )  . aspirin 81 MG chewable tablet Chew 1 tablet (81 mg total) by mouth daily.  Marland Kitchen  atorvastatin (LIPITOR) 40 MG tablet Take 1 tablet (40 mg total) by mouth daily.  . budesonide (PULMICORT) 0.5 MG/2ML nebulizer solution Take 2 mLs (0.5 mg total) by nebulization 2 (two) times daily. (Patient taking differently: Take 0.5 mg by nebulization 2 (two) times daily as needed (SOB, wheezing). )  . calcium citrate (CALCITRATE - DOSED IN MG ELEMENTAL CALCIUM) 950 MG tablet Take 2 tablets (400 mg of elemental calcium total) by mouth daily.  . Cholecalciferol 1000 units tablet Take 1 tablet (1,000 Units total) by mouth daily.  Marland Kitchen EPINEPHrine Base (PRIMATENE MIST IN) Inhale 1 puff into the lungs as needed (for breathing).  . furosemide (LASIX) 20 MG tablet Take 1 tablet (20 mg total) by mouth daily.  Marland Kitchen guaiFENesin (MUCINEX) 600 MG 12 hr tablet Take 600 mg by mouth 2 (two) times daily as needed for cough or to loosen phlegm.  Marland Kitchen ipratropium (ATROVENT) 0.02 % nebulizer solution Take 2.5 mLs (0.5 mg total) by nebulization 4 (four) times daily. Please dispense QS x 1 month (Patient taking differently: Take 0.5 mg by nebulization every 6 (six) hours as needed for wheezing or shortness of breath. Please dispense QS x 1 month)  . Ipratropium-Albuterol (COMBIVENT RESPIMAT) 20-100 MCG/ACT AERS respimat Inhale 1-2 puffs into the lungs every 4 (four) hours as needed for wheezing.  Marland Kitchen losartan (COZAAR) 50 MG tablet Take 1 tablet (50 mg total) by mouth daily.  . metoprolol succinate (TOPROL-XL) 25 MG 24 hr tablet Take 1.5 tablets (37.5 mg total) by mouth daily.  . Multiple Vitamin (MULTIVITAMIN WITH MINERALS) TABS tablet Take 1 tablet by mouth daily as needed (for vitamin).   . phenylephrine (4-WAY FAST ACTING) 1 % nasal spray Place 1 drop into both nostrils every 6 (six) hours as needed for congestion (4 way nasal spray).  . sodium chloride (OCEAN) 0.65 % SOLN nasal spray Place 1 spray into both nostrils as needed for congestion.  Marland Kitchen spironolactone (ALDACTONE) 25 MG tablet Take 0.5 tablets (12.5 mg total) by mouth  daily.  Marland Kitchen venlafaxine XR (EFFEXOR-XR) 150 MG 24 hr capsule TAKE 1 CAPSULE ONCE DAILY WITH BREAKFAST. (Patient taking differently: Take 150 mg by mouth daily with breakfast. )  . [DISCONTINUED] furosemide (LASIX) 40 MG tablet Take 1 tablet (40 mg total) by mouth daily.  . [DISCONTINUED] metoprolol succinate (TOPROL-XL) 25 MG 24 hr tablet Take 0.5 tablets (12.5 mg total) by mouth daily. (Patient taking differently: Take 25 mg by mouth daily. )     Allergies:   Coreg [carvedilol] and Aspartame and phenylalanine   Social History   Socioeconomic History  . Marital status: Married    Spouse name: Not on file  . Number of children: Not on file  . Years of education: Not on file  . Highest education level: Not on file  Occupational History  . Occupation: Unemployed  Social  Needs  . Financial resource strain: Not on file  . Food insecurity:    Worry: Not on file    Inability: Not on file  . Transportation needs:    Medical: Not on file    Non-medical: Not on file  Tobacco Use  . Smoking status: Current Every Day Smoker    Packs/day: 1.50    Years: 40.00    Pack years: 60.00    Types: Cigarettes  . Smokeless tobacco: Never Used  . Tobacco comment: a little more than 1 ppd (02/17/14)  Substance and Sexual Activity  . Alcohol use: Yes    Comment: 1/2 galloon liquior in 2 days     12/28/2014 wife reports 83oz / day  . Drug use: No  . Sexual activity: Not Currently  Lifestyle  . Physical activity:    Days per week: Not on file    Minutes per session: Not on file  . Stress: Not on file  Relationships  . Social connections:    Talks on phone: Not on file    Gets together: Not on file    Attends religious service: Not on file    Active member of club or organization: Not on file    Attends meetings of clubs or organizations: Not on file    Relationship status: Not on file  Other Topics Concern  . Not on file  Social History Narrative   Lives with wife in [place].     Family  History: The patient's family history includes Alzheimer's disease in his mother; Diabetes in his mother; Heart disease in his maternal uncle.  ROS:   Please see the history of present illness.    All other systems reviewed and are negative.  EKGs/Labs/Other Studies Reviewed:    The following studies were reviewed today:  Echocardiogram 09/30/17: Study Conclusions - Left ventricle: The cavity size was normal. Wall thickness was   increased in a pattern of mild LVH. Systolic function was   moderately to severely reduced. The estimated ejection fraction   was in the range of 30% to 35%. Diffuse hypokinesis. Doppler   parameters are consistent with abnormal left ventricular   relaxation (grade 1 diastolic dysfunction). - Aortic valve: There was no stenosis. - Mitral valve: Mildly calcified annulus. There was no significant   regurgitation. - Left atrium: The atrium was mildly dilated. - Right ventricle: The cavity size was normal. Systolic function   was normal. - Right atrium: The atrium was mildly dilated. - Pulmonary arteries: No complete TR doppler jet so unable to   estimate PA systolic pressure. - Systemic veins: IVC measured 2.5 cm with > 50% respirophasic   variation, suggesting RA pressure 8 mmHg.  Impressions: - Normal LV size with mild LV hypertrophy. EF 30-35%, diffuse   hypokinesis. Normal RV size and systolic function. No significant   valvular abnormalities.   EKG:  EKG is ordered today.  The ekg ordered today demonstrates sinus with HR 96 bpm.  Recent Labs: 03/25/2017: B Natriuretic Peptide 169.2 04/08/2017: TSH 2.670 10/07/2017: Magnesium 1.7 11/01/2017: ALT 30; BUN 13; Creatinine, Ser 0.85; Hemoglobin 14.7; Platelets 321; Potassium 4.4; Sodium 128  Recent Lipid Panel    Component Value Date/Time   CHOL 207 (H) 04/08/2017 1226   TRIG 96 04/08/2017 1226   HDL 40 04/08/2017 1226   CHOLHDL 5.2 (H) 04/08/2017 1226   CHOLHDL 4.4 02/13/2016 1446   VLDL 13  02/13/2016 1446   LDLCALC 148 (H) 04/08/2017 1226   LDLDIRECT 74  07/20/2012 1124    Physical Exam:    VS:  BP 133/81   Pulse 100   Ht _0  (1.727 m)   Wt 199 lb 12.8 oz (90.6 kg)   SpO2 97%   BMI 30.38 kg/m     Wt Readings from Last 3 Encounters:  10/23/17 199 lb 12.8 oz (90.6 kg)  10/22/2017 190 lb (86.2 kg)  10/07/17 198 lb 6.4 oz (90 kg)     GEN: elderly male in a wheelchair, in no acute distress HEENT: Normal NECK: No JVD; No carotid bruits CARDIAC: RRR, no murmurs, rubs, gallops RESPIRATORY:  Clear to auscultation without rales, wheezing or rhonchi, diminished in bases ABDOMEN: Soft, non-tender, non-distended MUSCULOSKELETAL:  trace edema; No deformity  SKIN: Warm and dry NEUROLOGIC:  Alert and oriented x 3 PSYCHIATRIC:  Normal affect   ASSESSMENT:    1. Chronic combined systolic and diastolic heart failure ([place])   2. Alcoholic cardiomyopathy ([place])   3. Atrial fibrillation with RVR ([place])   4. Essential hypertension    PLAN:    In order of problems listed above:  Chronic combined systolic and diastolic heart failure (HCC) - Plan: Basic Metabolic Panel (BMET) Alcoholic cardiomyopathy ([place]) He complains of some intermittent lower extremity edema and abdominal fullness. He is not weighing daily. I will start 20 mg lasix daily and encouraged him to continue 12.5 mg spironolactone. Collect BMP in 1 week.    Atrial fibrillation with RVR ([place])  He is in NSR today. He did not take his torpol this morning, making it difficult to titrate his dose. He think she has intermittent palpitations and rapid heart rate. On days that he takes his toprol, he reports a heart rate this is always at least 90 or above. I will increase his torpol conservatively to 37.5 mg daily. He will track his palpitations. Given his underlying lung disease and lack of anticoagulation, amiodarone may not be a good option for him. Of note, he has stopped drinking alcohol. If he continues with abstinence  and is compliant on medications, we can re-address anticoagulation.  Essential hypertension  Pressures are stable.  I would like to see him back in 3-4 weeks to check on his rate and rhythm and volume status.    Medication Adjustments/Labs and Tests Ordered: Current medicines are reviewed at length with the patient today.  Concerns regarding medicines are outlined above.  Orders Placed This Encounter  Procedures  . Basic Metabolic Panel (BMET)   Meds ordered this encounter  Medications  . furosemide (LASIX) 20 MG tablet    Sig: Take 1 tablet (20 mg total) by mouth daily.    Dispense:  30 tablet    Refill:  2  . metoprolol succinate (TOPROL-XL) 25 MG 24 hr tablet    Sig: Take 1.5 tablets (37.5 mg total) by mouth daily.    Dispense:  30 tablet    Refill:  0    Signed, Ledora Bottcher, [place]  10/23/2017 1:05 PM    [facility]

## 2017-10-23 ENCOUNTER — Encounter: Payer: Self-pay | Admitting: Physician Assistant

## 2017-10-23 ENCOUNTER — Ambulatory Visit: Payer: Medicare HMO | Admitting: Physician Assistant

## 2017-10-23 VITALS — BP 133/81 | HR 100 | Ht 68.0 in | Wt 199.8 lb

## 2017-10-23 DIAGNOSIS — I4891 Unspecified atrial fibrillation: Secondary | ICD-10-CM | POA: Diagnosis not present

## 2017-10-23 DIAGNOSIS — I10 Essential (primary) hypertension: Secondary | ICD-10-CM

## 2017-10-23 DIAGNOSIS — I426 Alcoholic cardiomyopathy: Secondary | ICD-10-CM

## 2017-10-23 DIAGNOSIS — R69 Illness, unspecified: Secondary | ICD-10-CM | POA: Diagnosis not present

## 2017-10-23 DIAGNOSIS — I5042 Chronic combined systolic (congestive) and diastolic (congestive) heart failure: Secondary | ICD-10-CM | POA: Diagnosis not present

## 2017-10-23 MED ORDER — METOPROLOL SUCCINATE ER 25 MG PO TB24: 37.5000 mg | ORAL_TABLET | Freq: Every day | ORAL | 0 refills | Status: DC

## 2017-10-23 MED ORDER — FUROSEMIDE 20 MG PO TABS: 20.0000 mg | ORAL_TABLET | Freq: Every day | ORAL | 2 refills | Status: DC

## 2017-10-23 NOTE — Patient Instructions (Addendum)
Medication Instructions:  INCREASE Toprol to 37.5mg  once a day  START Lasix 20mg  Take 1 tablet every morning  If you need a refill on your cardiac medications before your next appointment, please call your pharmacy.  Labwork: Your physician recommends that you return for lab work in: 1 week BMET (10/30/2017) Lab [employer] in our office  Testing/Procedures: NONE   Follow-Up: Your physician recommends that you schedule a follow-up appointment in: 3-4 WEEKS WITH ANGIE DUKE, PA-C  Any Other Special Instructions Will Be Listed Below (If Applicable). CHECK YOUR WEIGHTS EVERY MORNING

## 2017-10-31 DIAGNOSIS — R69 Illness, unspecified: Secondary | ICD-10-CM | POA: Diagnosis not present

## 2017-10-31 DIAGNOSIS — I5042 Chronic combined systolic (congestive) and diastolic (congestive) heart failure: Secondary | ICD-10-CM | POA: Diagnosis not present

## 2017-10-31 DIAGNOSIS — I10 Essential (primary) hypertension: Secondary | ICD-10-CM | POA: Diagnosis not present

## 2017-10-31 DIAGNOSIS — I4891 Unspecified atrial fibrillation: Secondary | ICD-10-CM | POA: Diagnosis not present

## 2017-10-31 LAB — BASIC METABOLIC PANEL
BUN / CREAT RATIO: 10 (ref 10–24)
BUN: 9 mg/dL (ref 8–27)
CHLORIDE: 93 mmol/L — AB (ref 96–106)
CO2: 22 mmol/L (ref 20–29)
CREATININE: 0.87 mg/dL (ref 0.76–1.27)
Calcium: 9.8 mg/dL (ref 8.6–10.2)
GFR, EST AFRICAN AMERICAN: 105 mL/min/{1.73_m2} (ref >59)
GFR, EST NON AFRICAN AMERICAN: 91 mL/min/{1.73_m2} (ref >59)
Glucose: 123 mg/dL — ABNORMAL HIGH (ref 65–99)
Potassium: 5.3 mmol/L — ABNORMAL HIGH (ref 3.5–5.2)
Sodium: 134 mmol/L (ref 134–144)

## 2017-11-03 ENCOUNTER — Telehealth: Payer: Self-pay | Admitting: Physician Assistant

## 2017-11-03 ENCOUNTER — Other Ambulatory Visit: Payer: Self-pay | Admitting: Family Medicine

## 2017-11-03 DIAGNOSIS — F411 Generalized anxiety disorder: Secondary | ICD-10-CM

## 2017-11-03 NOTE — Telephone Encounter (Signed)
New Message:   Patient returning  call back,

## 2017-11-03 NOTE — Telephone Encounter (Signed)
Reviewed PMP. Last filled both Clonazepam 0.5 mg (#30) and 1.0 mg (#30) on October 03, 2017. Refill appropriate. Reviewed most recent notes.   Terisa Starr, MD  Family Medicine Teaching Service

## 2017-11-04 ENCOUNTER — Other Ambulatory Visit: Payer: Self-pay | Admitting: Internal Medicine

## 2017-11-04 DIAGNOSIS — 419620001 Death: Secondary | SNOMED CT | POA: Diagnosis not present

## 2017-11-04 MED ORDER — METOPROLOL SUCCINATE ER 25 MG PO TB24: 37.5000 mg | ORAL_TABLET | Freq: Every day | ORAL | 11 refills | Status: DC

## 2017-11-04 NOTE — Telephone Encounter (Signed)
Patients wife directly notified and voiced understanding.

## 2017-11-04 NOTE — Telephone Encounter (Signed)
Rx(s) sent to pharmacy electronically.  

## 2017-11-04 NOTE — Telephone Encounter (Signed)
New Message:       *STAT* If patient is at the pharmacy, call can be transferred to refill team.   1. Which medications need to be refilled? (please list name of each medication and dose if known) metoprolol succinate (TOPROL-XL) 25 MG 24 hr tablet  2. Which pharmacy/location (including street and city if local pharmacy) is medication to be sent to?[id number] - [place], [place] - [address]  3. Do they need a 30 day or 90 day supply? 30

## 2017-11-04 DEATH — deceased

## 2017-11-09 ENCOUNTER — Other Ambulatory Visit: Payer: Self-pay | Admitting: Family Medicine

## 2017-11-09 IMAGING — CR DG CHEST 2V
3 series · 3 of 3 positions shown · non-contrast
Comparison: Chest radiograph December 16, 2014 and chest CT
December 16, 2014

CLINICAL DATA: Chest pain

EXAM:
CHEST  2 VIEW

[w chest lat]
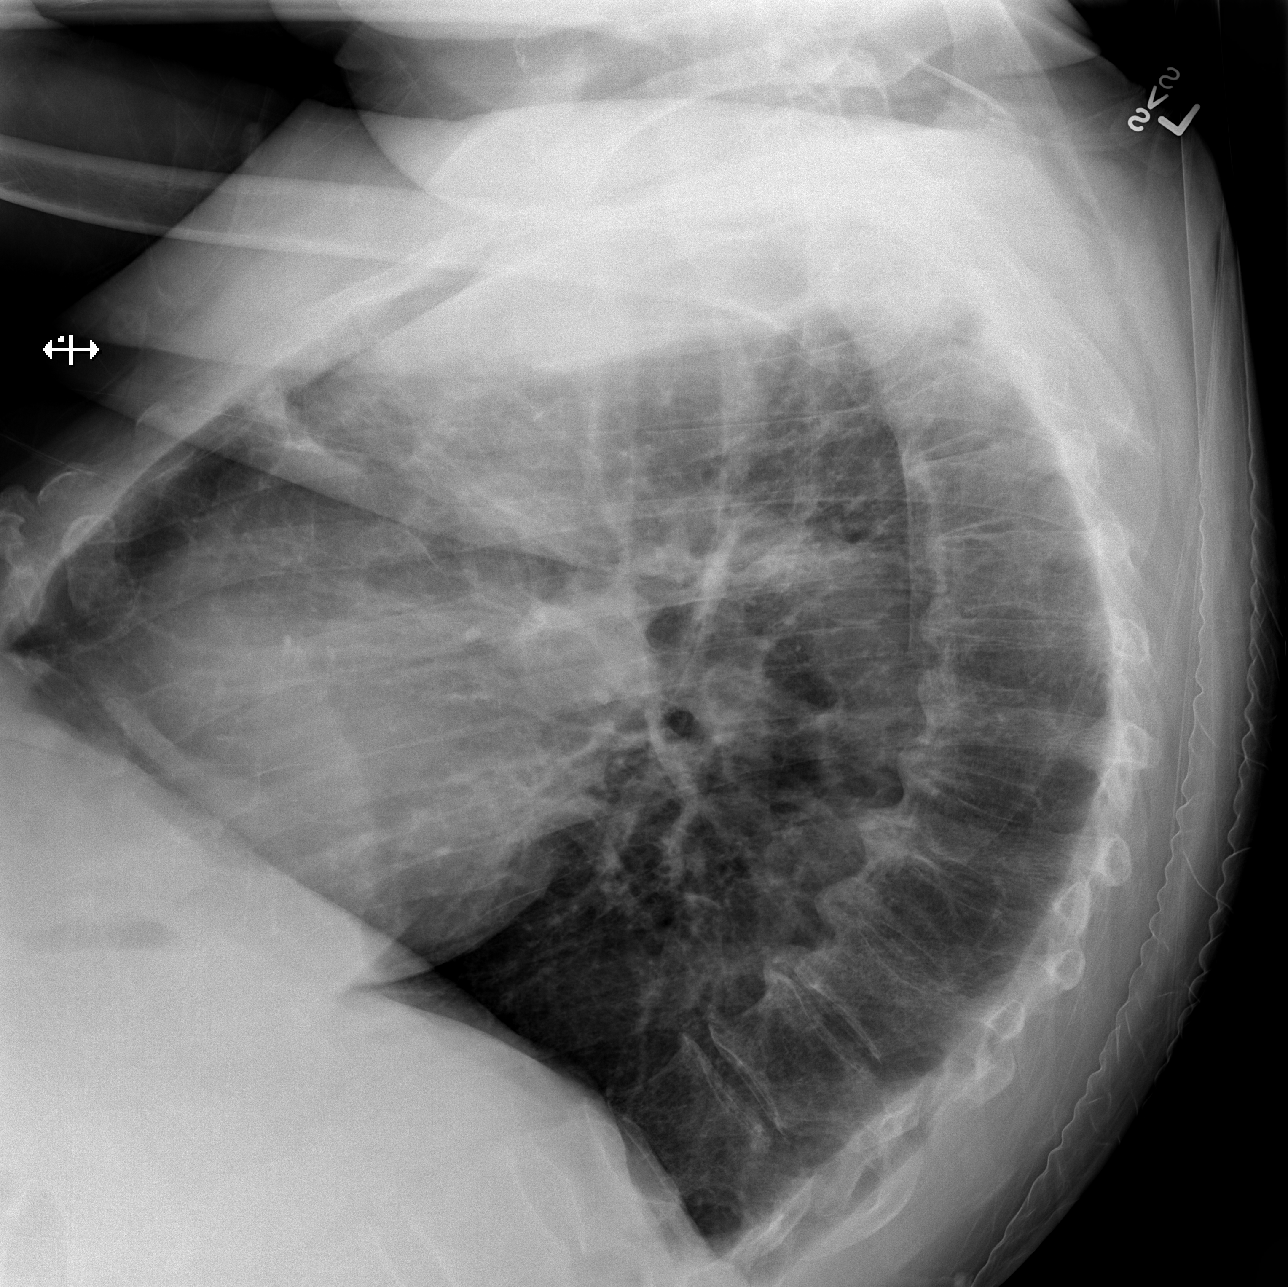

[x chest ap (1 of 2)]
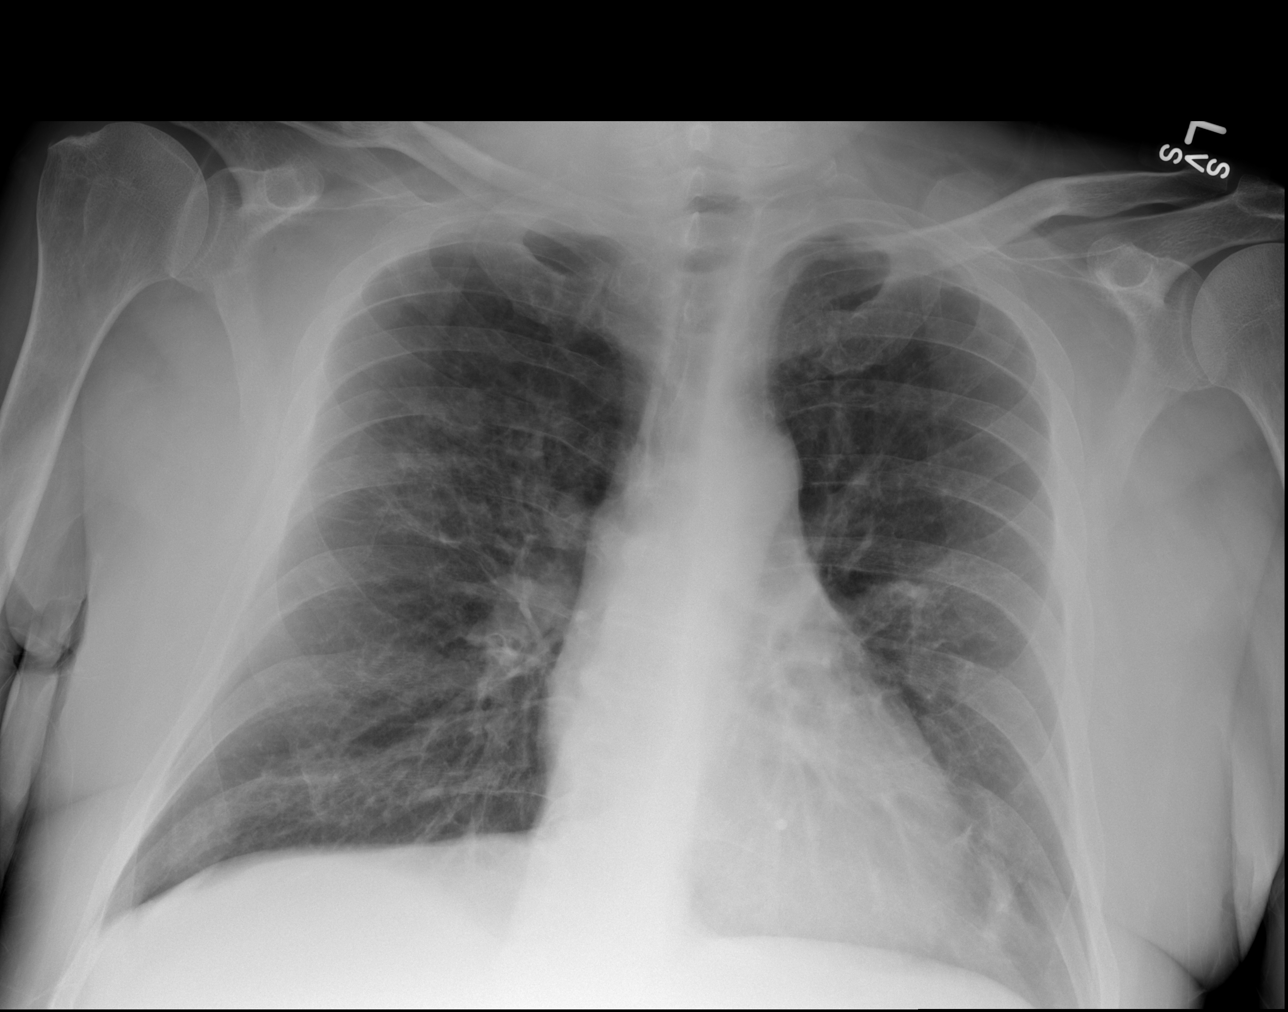

[x chest ap (2 of 2)]
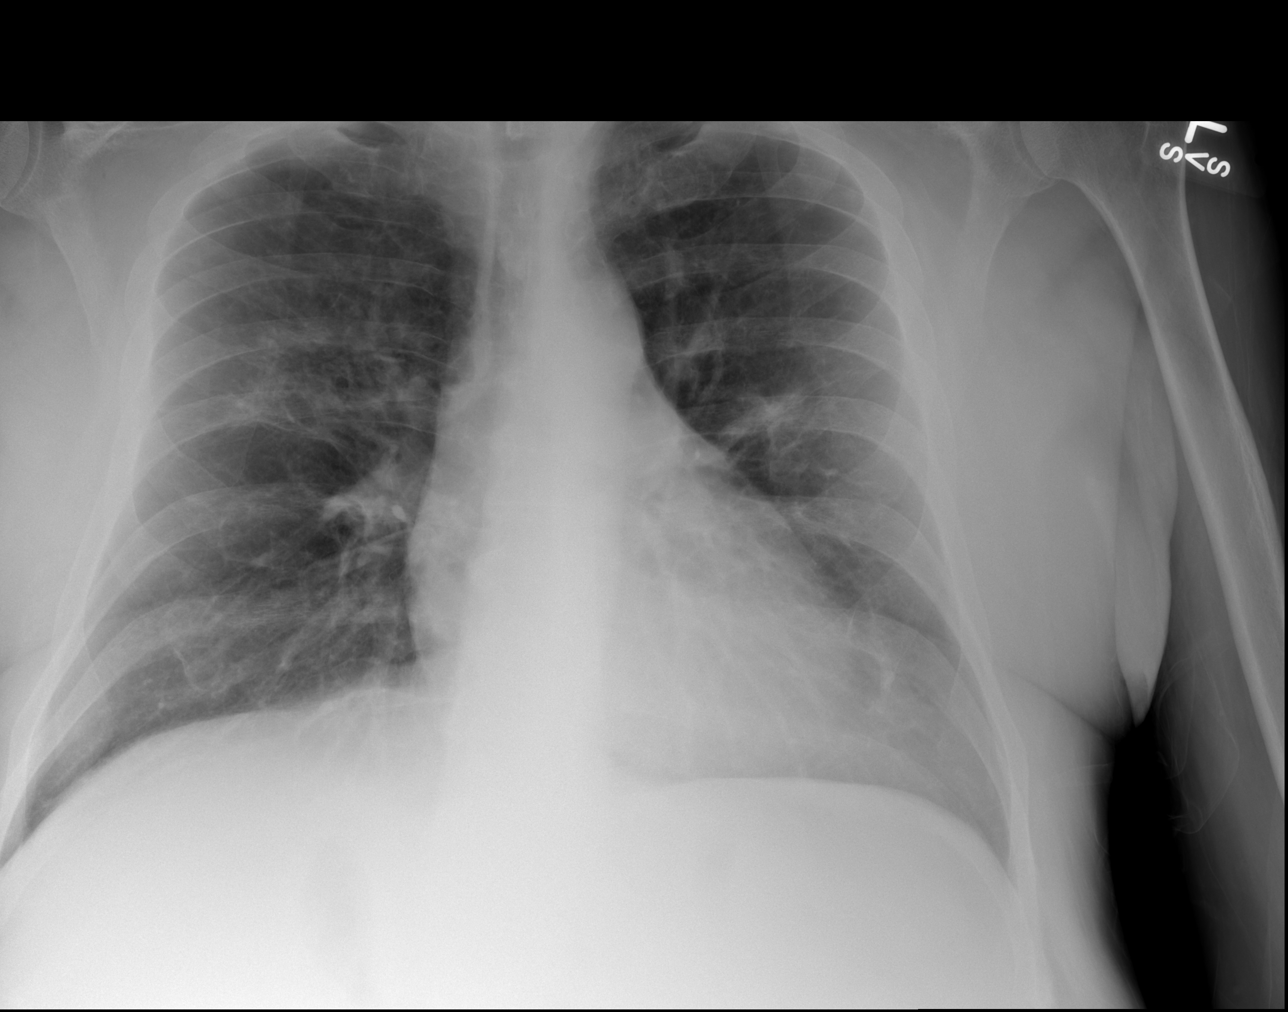

[3 of 3 positions shown; findings below may reference images not displayed]

FINDINGS: A 1.2 cm spiculated area in the left lung base is unchanged from
recent CT examination. There is mild patchy opacity in the right
upper. A mild degree of patchy opacity is also noted in the left
perihilar region. Lungs elsewhere clear. Heart size and pulmonary
vascularity are normal. No adenopathy. There is degenerative change
in the thoracic spine.
IMPRESSION: Patchy areas of infiltrate, less apparent compared to recent
studies. Somewhat spiculated area in the left base may represent
focal pneumonia but also could represent a small neoplastic focus.
Given this finding, a followup chest CT in 4-6 weeks is advised to
further evaluate. If this area persists after short interval
follow-up, further evaluation such as nuclear medicine PET study to
assess for neoplastic etiology would be advisable.

No new opacity compared to recent prior studies. No change in
cardiac silhouette.

## 2017-11-10 ENCOUNTER — Telehealth: Payer: Self-pay

## 2017-11-10 DIAGNOSIS — Z79899 Other long term (current) drug therapy: Secondary | ICD-10-CM

## 2017-11-10 LAB — BASIC METABOLIC PANEL
BUN/Creatinine Ratio: 14 (ref 10–24)
BUN: 13 mg/dL (ref 8–27)
CALCIUM: 9.8 mg/dL (ref 8.6–10.2)
CO2: 22 mmol/L (ref 20–29)
Chloride: 90 mmol/L — ABNORMAL LOW (ref 96–106)
Creatinine, Ser: 0.94 mg/dL (ref 0.76–1.27)
GFR, EST AFRICAN AMERICAN: 98 mL/min/{1.73_m2} (ref >59)
GFR, EST NON AFRICAN AMERICAN: 85 mL/min/{1.73_m2} (ref >59)
Glucose: 96 mg/dL (ref 65–99)
POTASSIUM: 5.7 mmol/L — AB (ref 3.5–5.2)
SODIUM: 130 mmol/L — AB (ref 134–144)

## 2017-11-10 NOTE — Telephone Encounter (Signed)
Pt present to office today for labwork. Orders placed (BMP) per result note on 11/03/17 by Valinda Party, PA.

## 2017-11-11 ENCOUNTER — Encounter (HOSPITAL_COMMUNITY): Payer: Self-pay | Admitting: Emergency Medicine

## 2017-11-11 ENCOUNTER — Emergency Department (HOSPITAL_COMMUNITY)
Admission: EM | Admit: 2017-11-11 | Discharge: 2017-11-11 | Disposition: A | Discharge status: Home / Self Care | Payer: Medicare HMO | Attending: Emergency Medicine | Admitting: Emergency Medicine

## 2017-11-11 ENCOUNTER — Other Ambulatory Visit: Payer: Self-pay

## 2017-11-11 ENCOUNTER — Telehealth: Payer: Self-pay | Admitting: Internal Medicine

## 2017-11-11 DIAGNOSIS — Z7982 Long term (current) use of aspirin: Secondary | ICD-10-CM | POA: Insufficient documentation

## 2017-11-11 DIAGNOSIS — J449 Chronic obstructive pulmonary disease, unspecified: Secondary | ICD-10-CM | POA: Insufficient documentation

## 2017-11-11 DIAGNOSIS — F17210 Nicotine dependence, cigarettes, uncomplicated: Secondary | ICD-10-CM | POA: Diagnosis not present

## 2017-11-11 DIAGNOSIS — I110 Hypertensive heart disease with heart failure: Secondary | ICD-10-CM | POA: Insufficient documentation

## 2017-11-11 DIAGNOSIS — I5023 Acute on chronic systolic (congestive) heart failure: Secondary | ICD-10-CM | POA: Diagnosis not present

## 2017-11-11 DIAGNOSIS — Z79899 Other long term (current) drug therapy: Secondary | ICD-10-CM | POA: Insufficient documentation

## 2017-11-11 DIAGNOSIS — E875 Hyperkalemia: Secondary | ICD-10-CM | POA: Insufficient documentation

## 2017-11-11 DIAGNOSIS — R69 Illness, unspecified: Secondary | ICD-10-CM | POA: Diagnosis not present

## 2017-11-11 LAB — CBC
HCT: 40.5 % (ref 39.0–52.0)
HEMOGLOBIN: 13.1 g/dL (ref 13.0–17.0)
MCH: 30.1 pg (ref 26.0–34.0)
MCHC: 32.3 g/dL (ref 30.0–36.0)
MCV: 93.1 fL (ref 80.0–100.0)
Platelets: 359 10*3/uL (ref 150–400)
RBC: 4.35 MIL/uL (ref 4.22–5.81)
RDW: 14.7 % (ref 11.5–15.5)
WBC: 10.2 10*3/uL (ref 4.0–10.5)
nRBC: 0 % (ref 0.0–0.2)

## 2017-11-11 LAB — BASIC METABOLIC PANEL
ANION GAP: 13 (ref 5–15)
BUN: 11 mg/dL (ref 8–23)
CO2: 24 mmol/L (ref 22–32)
Calcium: 9.2 mg/dL (ref 8.9–10.3)
Chloride: 93 mmol/L — ABNORMAL LOW (ref 98–111)
Creatinine, Ser: 0.79 mg/dL (ref 0.61–1.24)
GFR calc Af Amer: >60 mL/min (ref >60)
Glucose, Bld: 96 mg/dL (ref 70–99)
POTASSIUM: 4 mmol/L (ref 3.5–5.1)
Sodium: 130 mmol/L — ABNORMAL LOW (ref 135–145)

## 2017-11-11 NOTE — ED Provider Notes (Signed)
Patient placed in Quick Look pathway, seen and evaluated   Chief Complaint: abnormal lab  HPI:  Recent blood work with K 5.7, advised to come to ER for repeat blood work.  On lasix and spironolactone for HF.   ROS: positive: fatigue. Negative: Cp, Sob.   Physical Exam:   Gen: No distress  Neuro: Awake and Alert  Skin: Warm   Focused Exam:  RRR. Diminished lung sounds to lower lobes bilaterally. Normal WOB. Trace pitting edema bilaterally.    Initiation of care has begun. The patient has been counseled on the process, plan, and necessity for staying for the completion/evaluation, and the remainder of the medical screening examination    Jerrell Mylar 11/11/17 1507    Azalia Bilis, MD 11/11/17 [phone]

## 2017-11-11 NOTE — Telephone Encounter (Signed)
Spoke with the pt wife and she reports that the pt has been off of spironolactone for 7 days prior to his BMET and is not on any K supplements.. Per Micah Flesher PA pt to go to the ER and have his labs rechecked.. The BMET with the high K did not note it may have been hemolyzed so pt needs to be seen asap and have labs redrawn. Pt verbalized understanding.

## 2017-11-11 NOTE — Telephone Encounter (Signed)
New Message: ° ° ° °Patient is calling for lab results  °

## 2017-11-11 NOTE — ED Triage Notes (Signed)
Went to the cardiologist one day ago and drew blood. Patient received a call to go to the ED for evaluation because his K is high. Patient has no complaints at this time.

## 2017-11-11 NOTE — ED Provider Notes (Signed)
[facility] EMERGENCY DEPARTMENT Provider Note   CSN: [id number] Arrival date & time: 11/11/17  1415     History   Chief Complaint Chief Complaint  Patient presents with  . Abnormal Lab    HPI Juan [name] is a 67 y.o. male.  HPI Pt was at the doctors office and had some routine blood tests.   His medications were changed but family does not remember all of the doses. He was called today and was told that his potassium was 5.7 and he should come too th ED to be checked.  Pt denies any trouble with fevers or chills.  No vomiting or diarrhea.  Pt is urinating fine.  He states he feels fine today. Past Medical History:  Diagnosis Date  . Alcoholism (HCC)   . Allergy    seasonal   . Anxiety   . Asthma   . CHF (congestive heart failure) (HCC) 10/2010   ECHO:  EF 40%, Grade II diastolic dysfunction  . Chronic diastolic heart failure (HCC)   . COPD (chronic obstructive pulmonary disease) (HCC)   . COPD exacerbation (HCC)   . Depression   . Headache   . Hyperlipidemia   . Hypertension   . Lung nodule seen on imaging study 06/15/2013   Needs follow-up in 3 months (early 2017)   . RBBB 11/09/2012  . Seizures (HCC)   . Tremor 07/30/2011   Likely secondary to long-term alcohol abuse. Did discuss how his tremors affecting his alcoholism. We also switching her from albuterol to Atrovent in case this is causing any worsening of his tremor.   Marland Kitchen Umbilical hernia 08/06/2012    Patient Active Problem List   Diagnosis Date Noted  . Adjustment disorder with depressed mood 10/20/2017  . Alcoholic cardiomyopathy (HCC) 40/98/1191  . Excessive physiologic tremor 10/02/2017  . Cataract 06/26/2017  . Alcohol use disorder, severe, dependence (HCC) 11/14/2014  . Atrial fibrillation with RVR (HCC) 11/14/2014  . Peripheral neuropathy 12/15/2013  . Bronchiectasis without acute exacerbation (HCC) 06/30/2013  . Chronic systolic heart failure (HCC) 06/13/2013  . Acute on  chronic systolic (congestive) heart failure (HCC) 11/11/2012  . Umbilical hernia 08/06/2012  . Smoker unmotivated to quit 07/22/2012  . HTN (hypertension) 07/30/2011  . Hepatomegaly 11/05/2010    Past Surgical History:  Procedure Laterality Date  . TOENAIL EXCISION Right 1967   Removal of ingrown toenail [Other]        Home Medications    Prior to Admission medications   Medication Sig Start Date End Date Taking? Authorizing Provider  albuterol (PROVENTIL) (2.5 MG/3ML) 0.083% nebulizer solution INHALE 1 VIAL VIA NEBULIZER EVERY 6 HOURS AS NEEDED FOR WHEEZING OR SHORTNESS OF BREATH Patient taking differently: Take 2.5 mg by nebulization every 6 (six) hours as needed for wheezing or shortness of breath.  07/05/17   Diallo, Lilia Argue, MD  aspirin 81 MG chewable tablet Chew 1 tablet (81 mg total) by mouth daily. 12/20/15   Mayo, Allyn Kenner, MD  atorvastatin (LIPITOR) 40 MG tablet Take 1 tablet (40 mg total) by mouth daily. 04/10/17   Tobey Grim, MD  budesonide (PULMICORT) 0.5 MG/2ML nebulizer solution Take 2 mLs (0.5 mg total) by nebulization 2 (two) times daily. Patient taking differently: Take 0.5 mg by nebulization 2 (two) times daily as needed (SOB, wheezing).  01/15/17   Tobey Grim, MD  calcium citrate (CALCITRATE - DOSED IN MG ELEMENTAL CALCIUM) 950 MG tablet Take 2 tablets (400 mg of elemental calcium total)  by mouth daily. 10/04/17   Meccariello, Solmon Ice, DO  cholecalciferol (VITAMIN D) 1000 units tablet TAKE 1 TABLET BY MOUTH EVERY DAY 11/10/17   Tobey Grim, MD  clonazePAM (KLONOPIN) 1 MG tablet Take 1 tablet (1 mg total) by mouth daily. 11/03/17   Westley Chandler, MD  EPINEPHrine Base (PRIMATENE MIST IN) Inhale 1 puff into the lungs as needed (for breathing).    [provider]  furosemide (LASIX) 20 MG tablet Take 1 tablet (20 mg total) by mouth daily. 10/23/17   Duke, Roe Rutherford, PA  guaiFENesin (MUCINEX) 600 MG 12 hr tablet Take 600 mg by mouth 2 (two)  times daily as needed for cough or to loosen phlegm.    [provider]  ipratropium (ATROVENT) 0.02 % nebulizer solution Take 2.5 mLs (0.5 mg total) by nebulization 4 (four) times daily. Please dispense QS x 1 month Patient taking differently: Take 0.5 mg by nebulization every 6 (six) hours as needed for wheezing or shortness of breath. Please dispense QS x 1 month 04/08/17   Tobey Grim, MD  Ipratropium-Albuterol (COMBIVENT RESPIMAT) 20-100 MCG/ACT AERS respimat Inhale 1-2 puffs into the lungs every 4 (four) hours as needed for wheezing. 10/21/17   Tobey Grim, MD  losartan (COZAAR) 50 MG tablet TAKE 1 TABLET BY MOUTH EVERY DAY 11/10/17   Tobey Grim, MD  metoprolol succinate (TOPROL-XL) 25 MG 24 hr tablet Take 1.5 tablets (37.5 mg total) by mouth daily. 11/04/17   Hilty, Lisette Abu, MD  Multiple Vitamin (MULTIVITAMIN WITH MINERALS) TABS tablet Take 1 tablet by mouth daily as needed (for vitamin).     [provider]  phenylephrine (4-WAY FAST ACTING) 1 % nasal spray Place 1 drop into both nostrils every 6 (six) hours as needed for congestion (4 way nasal spray).    [provider]  sodium chloride (OCEAN) 0.65 % SOLN nasal spray Place 1 spray into both nostrils as needed for congestion.    [provider]  spironolactone (ALDACTONE) 25 MG tablet Take 0.5 tablets (12.5 mg total) by mouth daily. 10/04/17   Meccariello, Solmon Ice, DO  venlafaxine XR (EFFEXOR-XR) 150 MG 24 hr capsule TAKE 1 CAPSULE ONCE DAILY WITH BREAKFAST. Patient taking differently: Take 150 mg by mouth daily with breakfast.  04/14/17   Tobey Grim, MD  albuterol (PROVENTIL,VENTOLIN) 90 MCG/ACT inhaler Inhale 2 puffs into the lungs every 6 (six) hours as needed for wheezing. 01/03/11 04/22/11  Tobey Grim, MD    Family History Family History  Problem Relation Age of Onset  . Alzheimer's disease Mother   . Diabetes Mother   . Heart disease Maternal Uncle     Social  History Social History   Tobacco Use  . Smoking status: Current Every Day Smoker    Packs/day: 1.50    Years: 40.00    Pack years: 60.00    Types: Cigarettes  . Smokeless tobacco: Never Used  . Tobacco comment: a little more than 1 ppd (02/17/14)  Substance Use Topics  . Alcohol use: Yes    Comment: 1/2 galloon liquior in 2 days     12/28/2014 wife reports 32oz / day  . Drug use: No     Allergies   Coreg [carvedilol] and Aspartame and phenylalanine   Review of Systems Review of Systems  All other systems reviewed and are negative.    Physical Exam Updated Vital Signs BP 125/82 (BP Location: Right Arm)   Pulse 94  Temp 98.7 F (37.1 C) (Oral)   Resp 20   Ht 1.727 m (5\' 8" )   Wt 90 kg   SpO2 99%   BMI 30.17 kg/m   Physical Exam  Constitutional: He appears well-developed and well-nourished. No distress.  HENT:  Head: Normocephalic and atraumatic.  Right Ear: External ear normal.  Left Ear: External ear normal.  Eyes: Conjunctivae are normal. Right eye exhibits no discharge. Left eye exhibits no discharge. No scleral icterus.  Neck: Neck supple. No tracheal deviation present.  Cardiovascular: Normal rate, regular rhythm and intact distal pulses.  Pulmonary/Chest: Effort normal and breath sounds normal. No stridor. No respiratory distress. He has no wheezes. He has no rales.  Abdominal: Soft. Bowel sounds are normal. He exhibits no distension. There is no tenderness. There is no rebound and no guarding.  Musculoskeletal: He exhibits no edema or tenderness.  Neurological: He is alert. He has normal strength. No sensory deficit. Cranial nerve deficit: no gross deficits. He exhibits normal muscle tone. He displays no seizure activity. Coordination normal.  Skin: Skin is warm and dry. No rash noted.  Psychiatric: He has a normal mood and affect.  Nursing note and vitals reviewed.    ED Treatments / Results  Labs (all labs ordered are listed, but only abnormal  results are displayed) Labs Reviewed  BASIC METABOLIC PANEL - Abnormal; Notable for the following components:      Result Value   Sodium 130 (*)    Chloride 93 (*)    All other components within normal limits  CBC    EKG EKG Interpretation  Date/Time:  Tuesday November 11 2017 15:03:43 EDT Ventricular Rate:  86 PR Interval:  218 QRS Duration: 168 QT Interval:  444 QTC Calculation: 531 R Axis:   -123 Text Interpretation:  Sinus rhythm with 1st degree A-V block Right bundle branch block Abnormal ECG No significant change since last tracing Confirmed by Linwood Dibbles [phone]) on 11/11/2017 4:11:52 PM   Radiology No results found.  Procedures Procedures (including critical care time)  Medications Ordered in ED Medications - No data to display   Initial Impression / Assessment and Plan / ED Course  I have reviewed the triage vital signs and the nursing notes.  Pertinent labs & imaging results that were available during my care of the patient were reviewed by me and considered in my medical decision making (see chart for details).    Patient presented to the emergency room to have his potassium level checked.  Patient had outpatient laboratory tests yesterday and his potassium was elevated at 5.7.  Here in the emergency room today his potassium level is normal.  His sodium is low at 130 but this is unchanged from previous values.  Patient denies any complaints and he feels fine.  I suspect the elevated potassium level may have been related to hemolysis.  At this time there does not appear to be any evidence of an acute emergency medical condition and the patient appears stable for discharge with appropriate outpatient follow up.   Final Clinical Impressions(s) / ED Diagnoses   Final diagnoses:  Labor abnormality    ED Discharge Orders    None       Linwood Dibbles, MD 11/11/17 1616

## 2017-11-11 NOTE — ED Notes (Signed)
Provider in triage 

## 2017-11-11 NOTE — Discharge Instructions (Addendum)
Your recheck potassium today was normal.  Continue your current medications.  Follow-up with your doctor as planned

## 2017-11-12 ENCOUNTER — Telehealth: Payer: Self-pay | Admitting: Physician Assistant

## 2017-11-12 NOTE — Telephone Encounter (Signed)
   Pt c/o medication issue:  1. Name of Medication: furosemide (LASIX) 20 MG tablet  2. How are you currently taking this medication (dosage and times per day)? Take 1 tablet (20 mg total) by mouth daily.  3. Are you having a reaction (difficulty breathing--STAT)?  NA  4. What is your medication issue? Was told to up this medication to 40 mg but is questioning whether to up the med or stay on 20 mg   Patient would also like to know if he still needs to come in for blood work tomorrow

## 2017-11-12 NOTE — Telephone Encounter (Signed)
Spoke with pt wife DPR on file. Pt sts that the pt was seen in the ED on 10/8. A bmet was repeated and his potassium was with in normal range 4.0. Pt wife would like to know if it is still necessary for the pt to come to our office for a repeat labs on 11/13/17 and if he should make the med changes that were previously given. D/c Spironolactone, increase Lasix to 40mg  daily.  Placed her on hold while a discussed with the office DOD Dr. Jacques Navy. Per Dr.Acharya do not resume Spironolactone. Continue Lasix 20mg  daily. Pt should weigh daily. Take an additional Lasix if his weight is up 3lbs in a day 5lbs in a week. If he begins to develop symptoms like palpitations, racing heart, dizziness he contact the office or go to the ED. Pt is to f/u as planned with Angie Duke,PA on 11/18/17. Pt wife verbalized understanding and voiced appreciation for the assistance.

## 2017-11-13 ENCOUNTER — Other Ambulatory Visit: Payer: Self-pay | Admitting: Family Medicine

## 2017-11-14 ENCOUNTER — Other Ambulatory Visit: Payer: Self-pay

## 2017-11-14 NOTE — Telephone Encounter (Signed)
Wife called and left message which stated pharmacy has been requesting this without response.   Patient is out and needs as soon as possible.  No prior request seen and [facility] was not in profile. Attempted to call back to let her know we are working on this but phone was busy.  Call back is [phone]  Ples Specter, RN [facility] RN)

## 2017-11-15 ENCOUNTER — Other Ambulatory Visit: Payer: Self-pay | Admitting: Family Medicine

## 2017-11-15 DIAGNOSIS — R69 Illness, unspecified: Secondary | ICD-10-CM | POA: Diagnosis not present

## 2017-11-15 MED ORDER — VENLAFAXINE HCL ER 150 MG PO CP24: ORAL_CAPSULE | ORAL | 0 refills | Status: DC

## 2017-11-17 NOTE — Telephone Encounter (Signed)
Refilled on another encounter. Ples Specter, RN [facility] RN)

## 2017-11-17 NOTE — Progress Notes (Signed)
Cardiology Office Note:    Date:  11/18/2017   ID:  Juan [name], DOB 12-13-1950, MRN [id number]  PCP:  Tobey Grim, MD  Cardiologist:  Chrystie Nose, MD   Referring MD: Tobey Grim, MD   Chief Complaint  Patient presents with  . Follow-up    3-4 weeks.  . Shortness of Breath    History of Present Illness:    Juan [name] is a 67 y.o. male with a hx of chronic systolic heart failure, alcoholic cardiomyopathy, HTN, Afib RVR, ongoing tobacco abuse, alcoholism, COPD, lung nodules, HLD, HTN, and anxiety. HFrEF improved to 60% on medical therapy in 2014.  I saw him in clinic on 10/23/17 following a hospitalization for sepsis, A. fib RVR, and COPD exacerbation.  He was not anticoagulated for his A. Fib at discharge because he was unwilling to stop drinking. When I saw him in clinic, he had reportedly been abstinent and he was in sinus rhythm. I deferred anticoagulation once again.  He was maintained on toprol, losartan, and aldactone. He was deconditioned and continued to be SOB.  He also reported palpitations and toprol was increased to 37.5 mg daily.  Labs showed increased potassium of 5.3 and I stopped his spironolactone. Repeat labs showed a potassium of 5.7 and he was instructed to go to the ER for potassium check. On recheck, K was 4.0. He continued to take lasix 20 mg daily. He has normal renal function.  He returns to clinic for follow up.  He is taking 20 mg of Lasix every day.  No other changes to his medication.  He is chronically short of breath, likely due to his COPD.  His shortness of breath is helps with his inhalers.  He is scheduled to see a pulmonologist soon.  He remains off of spironolactone.  I would avoid this in the future given his hyperkalemia.  He continues to smoke cigarettes and to drink alcohol nightly.  About 1 month ago, his PCP increase his Klonopin to a total of 2 mg a day, he was previously taking 1 mg/day total.  He reports that he is having  trouble with memory issues over the past 3 to 4 weeks, which correlates to the increased Klonopin dose.  He continues to drink alcohol nightly.  We discussed the interaction of benzodiazepines and alcohol on dizziness and falls, and memory problems.  I asked them to speak with his PCP about possibly reducing his total Klonopin intake to 1.5 mg daily.  Wife has lots of questions and discussion points concerning his A. fib, heart failure, alcoholism, and smoking.  I advised them to try to work on the alcoholism first.  She inquires about a nicotine patch.  I again reiterated that we are not able to safely anticoagulate him in the setting of chronic benzodiazepine use and alcoholism given his fall risk.  We also discussed his stroke risk in the setting of paroxysmal A. fib.   Past Medical History:  Diagnosis Date  . Alcoholism (HCC)   . Allergy    seasonal   . Anxiety   . Asthma   . CHF (congestive heart failure) (HCC) 10/2010   ECHO:  EF 40%, Grade II diastolic dysfunction  . Chronic diastolic heart failure (HCC)   . COPD (chronic obstructive pulmonary disease) (HCC)   . COPD exacerbation (HCC)   . Depression   . Headache   . Hyperlipidemia   . Hypertension   . Lung nodule seen on imaging  study 06/15/2013   Needs follow-up in 3 months (early 2017)   . RBBB 11/09/2012  . Seizures (HCC)   . Tremor 07/30/2011   Likely secondary to long-term alcohol abuse. Did discuss how his tremors affecting his alcoholism. We also switching her from albuterol to Atrovent in case this is causing any worsening of his tremor.   Marland Kitchen Umbilical hernia 08/06/2012    Past Surgical History:  Procedure Laterality Date  . TOENAIL EXCISION Right 1967   Removal of ingrown toenail [Other]    Current Medications: Current Meds  Medication Sig  . albuterol (PROVENTIL) (2.5 MG/3ML) 0.083% nebulizer solution INHALE 1 VIAL VIA NEBULIZER EVERY 6 HOURS AS NEEDED FOR WHEEZING OR SHORTNESS OF BREATH (Patient taking differently:  Take 2.5 mg by nebulization every 6 (six) hours as needed for wheezing or shortness of breath. )  . aspirin 81 MG chewable tablet Chew 1 tablet (81 mg total) by mouth daily.  Marland Kitchen atorvastatin (LIPITOR) 40 MG tablet Take 1 tablet (40 mg total) by mouth daily.  . budesonide (PULMICORT) 0.5 MG/2ML nebulizer solution INHALE 1 VIAL VIA NEBULIZER TWICE A DAY (PA REQ)  . calcium citrate (CALCITRATE - DOSED IN MG ELEMENTAL CALCIUM) 950 MG tablet Take 2 tablets (400 mg of elemental calcium total) by mouth daily.  . cholecalciferol (VITAMIN D) 1000 units tablet TAKE 1 TABLET BY MOUTH EVERY DAY  . clonazePAM (KLONOPIN) 1 MG tablet Take 1 tablet (1 mg total) by mouth daily.  Marland Kitchen EPINEPHrine Base (PRIMATENE MIST IN) Inhale 1 puff into the lungs as needed (for breathing).  . furosemide (LASIX) 20 MG tablet Take 1 tablet (20 mg total) by mouth daily.  . furosemide (LASIX) 40 MG tablet TAKE 1 TABLET BY MOUTH EVERY DAY  . guaiFENesin (MUCINEX) 600 MG 12 hr tablet Take 600 mg by mouth 2 (two) times daily as needed for cough or to loosen phlegm.  Marland Kitchen ipratropium (ATROVENT) 0.02 % nebulizer solution Take 2.5 mLs (0.5 mg total) by nebulization 4 (four) times daily. Please dispense QS x 1 month (Patient taking differently: Take 0.5 mg by nebulization every 6 (six) hours as needed for wheezing or shortness of breath. Please dispense QS x 1 month)  . Ipratropium-Albuterol (COMBIVENT RESPIMAT) 20-100 MCG/ACT AERS respimat Inhale 1-2 puffs into the lungs every 4 (four) hours as needed for wheezing.  Marland Kitchen losartan (COZAAR) 50 MG tablet TAKE 1 TABLET BY MOUTH EVERY DAY  . metoprolol succinate (TOPROL-XL) 25 MG 24 hr tablet Take 1.5 tablets (37.5 mg total) by mouth daily.  . Multiple Vitamin (MULTIVITAMIN WITH MINERALS) TABS tablet Take 1 tablet by mouth daily as needed (for vitamin).   . phenylephrine (4-WAY FAST ACTING) 1 % nasal spray Place 1 drop into both nostrils every 6 (six) hours as needed for congestion (4 way nasal spray).  .  sodium chloride (OCEAN) 0.65 % SOLN nasal spray Place 1 spray into both nostrils as needed for congestion.  Marland Kitchen spironolactone (ALDACTONE) 25 MG tablet Take 0.5 tablets (12.5 mg total) by mouth daily.  Marland Kitchen venlafaxine XR (EFFEXOR-XR) 150 MG 24 hr capsule TAKE 1 CAPSULE ONCE DAILY WITH BREAKFAST.     Allergies:   Coreg [carvedilol] and Aspartame and phenylalanine   Social History   Socioeconomic History  . Marital status: Married    Spouse name: Not on file  . Number of children: Not on file  . Years of education: Not on file  . Highest education level: Not on file  Occupational History  . Occupation: Unemployed  Social Needs  . Financial resource strain: Not on file  . Food insecurity:    Worry: Not on file    Inability: Not on file  . Transportation needs:    Medical: Not on file    Non-medical: Not on file  Tobacco Use  . Smoking status: Current Every Day Smoker    Packs/day: 1.50    Years: 40.00    Pack years: 60.00    Types: Cigarettes  . Smokeless tobacco: Never Used  . Tobacco comment: a little more than 1 ppd (02/17/14)  Substance and Sexual Activity  . Alcohol use: Yes    Comment: 1/2 galloon liquior in 2 days     12/28/2014 wife reports 32oz / day  . Drug use: No  . Sexual activity: Not Currently  Lifestyle  . Physical activity:    Days per week: Not on file    Minutes per session: Not on file  . Stress: Not on file  Relationships  . Social connections:    Talks on phone: Not on file    Gets together: Not on file    Attends religious service: Not on file    Active member of club or organization: Not on file    Attends meetings of clubs or organizations: Not on file    Relationship status: Not on file  Other Topics Concern  . Not on file  Social History Narrative   Lives with wife in [place].     Family History: The patient's family history includes Alzheimer's disease in his mother; Diabetes in his mother; Heart disease in his maternal uncle.  ROS:     Please see the history of present illness.     All other systems reviewed and are negative.  EKGs/Labs/Other Studies Reviewed:    The following studies were reviewed today:  Echo 09/30/17: Study Conclusions - Left ventricle: The cavity size was normal. Wall thickness was   increased in a pattern of mild LVH. Systolic function was   moderately to severely reduced. The estimated ejection fraction   was in the range of 30% to 35%. Diffuse hypokinesis. Doppler   parameters are consistent with abnormal left ventricular   relaxation (grade 1 diastolic dysfunction). - Aortic valve: There was no stenosis. - Mitral valve: Mildly calcified annulus. There was no significant   regurgitation. - Left atrium: The atrium was mildly dilated. - Right ventricle: The cavity size was normal. Systolic function   was normal. - Right atrium: The atrium was mildly dilated. - Pulmonary arteries: No complete TR doppler jet so unable to   estimate PA systolic pressure. - Systemic veins: IVC measured 2.5 cm with > 50% respirophasic   variation, suggesting RA pressure 8 mmHg.  Impressions: - Normal LV size with mild LV hypertrophy. EF 30-35%, diffuse   hypokinesis. Normal RV size and systolic function. No significant   valvular abnormalities.  EKG:  EKG is not ordered today.   Recent Labs: 03/25/2017: B Natriuretic Peptide 169.2 04/08/2017: TSH 2.670 10/07/2017: Magnesium 1.7 10/26/2017: ALT 30 11/11/2017: BUN 11; Creatinine, Ser 0.79; Hemoglobin 13.1; Platelets 359; Potassium 4.0; Sodium 130  Recent Lipid Panel    Component Value Date/Time   CHOL 207 (H) 04/08/2017 1226   TRIG 96 04/08/2017 1226   HDL 40 04/08/2017 1226   CHOLHDL 5.2 (H) 04/08/2017 1226   CHOLHDL 4.4 02/13/2016 1446   VLDL 13 02/13/2016 1446   LDLCALC 148 (H) 04/08/2017 1226   LDLDIRECT 74 07/20/2012 1124    Physical Exam:  VS:  BP 118/80 (BP Location: Left Arm, Patient Position: Sitting, Cuff Size: Normal)   Pulse 86   Ht  5\' 8"  (1.727 m)   Wt 202 lb (91.6 kg)   BMI 30.71 kg/m     Wt Readings from Last 3 Encounters:  11/18/17 202 lb (91.6 kg)  11/11/17 198 lb 6.6 oz (90 kg)  10/23/17 199 lb 12.8 oz (90.6 kg)     GEN: Well nourished, well developed in no acute distress HEENT: Normal NECK: No JVD; No carotid bruits CARDIAC: irregular rhythm, regular rate, no murmurs, rubs, gallops RESPIRATORY:  Clear to auscultation without rales, wheezing or rhonchi  ABDOMEN: Soft, non-tender, non-distended MUSCULOSKELETAL:  No edema; No deformity  SKIN: Warm and dry NEUROLOGIC:  Alert and oriented x 3 PSYCHIATRIC:  Normal affect   ASSESSMENT:    1. Chronic systolic heart failure (HCC)   2. Alcoholic cardiomyopathy (HCC)   3. Essential hypertension   4. Alcohol use disorder, severe, dependence (HCC)   5. Smoker unmotivated to quit   6. Paroxysmal atrial fibrillation (HCC)    PLAN:    In order of problems listed above:  Chronic systolic heart failure (HCC) Alcoholic cardiomyopathy (HCC) We had a long discussion about his heart function and the role of alcohol. He is euvolemic on exam today. He weighs daily and is compliant on his medications.  He is aware and an active participant in his medical care.  Continue to avoid spironolactone given his subsequent hyperkalemia.  We have not changed his medications further, I will defer repeat lab work today.   Essential hypertension Pressure is well controlled today no medication changes.   Alcohol use disorder, severe, dependence (HCC) Smoker unmotivated to quit The wife inquires about nicotine patch.  I encouraged him to work on the alcoholism first.     Paroxysmal atrial fibrillation This patients CHA2DS2-VASc Score and unadjusted Ischemic Stroke Rate (% per year) is equal to 3.2 % stroke rate/year from a score of 3 (CHF, HTN, age) I will again continue to hold off on anticoagulation given his concurrent benzodiazepine use and alcoholism.  He is a fall risk.   It is unclear if he is motivated to cut back on alcohol or cigarettes.  We discussed his stroke risk.   I would like to see him back in 3 months.   Medication Adjustments/Labs and Tests Ordered: Current medicines are reviewed at length with the patient today.  Concerns regarding medicines are outlined above.  No orders of the defined types were placed in this encounter.  No orders of the defined types were placed in this encounter.   Signed, Marcelino Duster, PA  11/18/2017 11:42 AM    [facility]

## 2017-11-18 ENCOUNTER — Ambulatory Visit: Payer: Medicare HMO | Admitting: Physician Assistant

## 2017-11-18 ENCOUNTER — Encounter: Payer: Self-pay | Admitting: Physician Assistant

## 2017-11-18 VITALS — BP 118/80 | HR 86 | Ht 68.0 in | Wt 202.0 lb

## 2017-11-18 DIAGNOSIS — F1020 Alcohol dependence, uncomplicated: Secondary | ICD-10-CM | POA: Diagnosis not present

## 2017-11-18 DIAGNOSIS — I5022 Chronic systolic (congestive) heart failure: Secondary | ICD-10-CM

## 2017-11-18 DIAGNOSIS — I10 Essential (primary) hypertension: Secondary | ICD-10-CM | POA: Diagnosis not present

## 2017-11-18 DIAGNOSIS — I426 Alcoholic cardiomyopathy: Secondary | ICD-10-CM | POA: Diagnosis not present

## 2017-11-18 DIAGNOSIS — I480 Paroxysmal atrial fibrillation: Secondary | ICD-10-CM | POA: Diagnosis not present

## 2017-11-18 DIAGNOSIS — R69 Illness, unspecified: Secondary | ICD-10-CM | POA: Diagnosis not present

## 2017-11-18 DIAGNOSIS — F17200 Nicotine dependence, unspecified, uncomplicated: Secondary | ICD-10-CM

## 2017-11-18 NOTE — Patient Instructions (Signed)
Medication Instructions:  No Changes. If you need a refill on your cardiac medications before your next appointment, please call your pharmacy.   Lab work: None Ordered. If you have labs (blood work) drawn today and your tests are completely normal, you will receive your results only by: Marland Kitchen MyChart Message (if you have MyChart) OR . A paper copy in the mail If you have any lab test that is abnormal or we need to change your treatment, we will call you to review the results.  Testing/Procedures: None Ordered.  Follow-Up: At [facility], you and your health needs are our priority.  As part of our continuing mission to provide you with exceptional heart care, we have created designated Provider Care Teams.  These Care Teams include your primary Cardiologist (physician) and Advanced Practice Providers (APPs -  Physician Assistants and Nurse Practitioners) who all work together to provide you with the care you need, when you need it. You will need a follow up appointment in 3 months.  Please call our office 2 months in advance to schedule this appointment.  You may see Juan Nose, MD or one of the following Advanced Practice Providers on your designated Care Team: Juan [name], Juan [name] . Juan Flesher, PA-C  Any Other Special Instructions Will Be Listed Below (If Applicable). None.

## 2017-11-20 ENCOUNTER — Other Ambulatory Visit: Payer: Self-pay | Admitting: Family Medicine

## 2017-11-20 DIAGNOSIS — R69 Illness, unspecified: Secondary | ICD-10-CM | POA: Diagnosis not present

## 2017-12-11 DIAGNOSIS — R69 Illness, unspecified: Secondary | ICD-10-CM | POA: Diagnosis not present

## 2017-12-14 ENCOUNTER — Other Ambulatory Visit: Payer: Self-pay | Admitting: Family Medicine

## 2017-12-16 ENCOUNTER — Other Ambulatory Visit: Payer: Self-pay | Admitting: Family Medicine

## 2017-12-17 DIAGNOSIS — R69 Illness, unspecified: Secondary | ICD-10-CM | POA: Diagnosis not present

## 2017-12-18 ENCOUNTER — Encounter: Payer: Self-pay | Admitting: Family Medicine

## 2017-12-18 DIAGNOSIS — J449 Chronic obstructive pulmonary disease, unspecified: Secondary | ICD-10-CM

## 2017-12-18 DIAGNOSIS — R29898 Other symptoms and signs involving the musculoskeletal system: Secondary | ICD-10-CM

## 2017-12-19 ENCOUNTER — Telehealth: Payer: Self-pay

## 2017-12-19 NOTE — Telephone Encounter (Signed)
Fax from pharmacy that Losartan 50 mg is on backorder but Losartan 25 mg and 100 mg is available. Please send alternative with new directions.  Ples Specter, RN [facility] RN)

## 2017-12-22 MED ORDER — LOSARTAN POTASSIUM 25 MG PO TABS: 50.0000 mg | ORAL_TABLET | Freq: Every day | ORAL | 2 refills | Status: DC

## 2017-12-22 NOTE — Telephone Encounter (Signed)
Change made.

## 2017-12-23 ENCOUNTER — Other Ambulatory Visit: Payer: Self-pay | Admitting: Family Medicine

## 2017-12-23 DIAGNOSIS — R69 Illness, unspecified: Secondary | ICD-10-CM | POA: Diagnosis not present

## 2017-12-26 NOTE — Telephone Encounter (Signed)
I have completed the request for a wheelchair.

## 2018-01-10 ENCOUNTER — Other Ambulatory Visit: Payer: Self-pay | Admitting: Family Medicine

## 2018-01-13 ENCOUNTER — Other Ambulatory Visit: Payer: Self-pay | Admitting: Family Medicine

## 2018-01-13 ENCOUNTER — Encounter: Payer: Self-pay | Admitting: Family Medicine

## 2018-01-19 ENCOUNTER — Other Ambulatory Visit: Payer: Self-pay | Admitting: Family Medicine

## 2018-01-26 ENCOUNTER — Ambulatory Visit: Payer: Self-pay | Admitting: Family

## 2018-02-03 DIAGNOSIS — J441 Chronic obstructive pulmonary disease with (acute) exacerbation: Secondary | ICD-10-CM | POA: Diagnosis not present

## 2018-02-03 DIAGNOSIS — I5022 Chronic systolic (congestive) heart failure: Secondary | ICD-10-CM | POA: Diagnosis not present

## 2018-02-03 DIAGNOSIS — J449 Chronic obstructive pulmonary disease, unspecified: Secondary | ICD-10-CM | POA: Diagnosis not present

## 2018-02-07 DIAGNOSIS — R69 Illness, unspecified: Secondary | ICD-10-CM | POA: Diagnosis not present

## 2018-02-18 ENCOUNTER — Other Ambulatory Visit: Payer: Self-pay | Admitting: Family Medicine

## 2018-02-19 ENCOUNTER — Ambulatory Visit: Payer: Medicare HMO | Admitting: Internal Medicine

## 2018-02-23 ENCOUNTER — Other Ambulatory Visit: Payer: Self-pay

## 2018-02-23 ENCOUNTER — Telehealth: Payer: Self-pay

## 2018-02-23 NOTE — Telephone Encounter (Signed)
Pts wife called nurse line stating a new prescription needs to be sent in for Combivent. Per patients insurance the pt needs to receive a quantity of 2 inhalers. This will last him 30 days and keep from duplicating a 100 dollar copay 2x a month. Please advise.

## 2018-02-24 ENCOUNTER — Other Ambulatory Visit: Payer: Self-pay | Admitting: Family Medicine

## 2018-02-25 MED ORDER — VENLAFAXINE HCL ER 150 MG PO CP24: ORAL_CAPSULE | ORAL | 0 refills | Status: DC

## 2018-02-25 MED ORDER — IPRATROPIUM-ALBUTEROL 20-100 MCG/ACT IN AERS: INHALATION_SPRAY | RESPIRATORY_TRACT | 2 refills | Status: DC

## 2018-02-25 NOTE — Addendum Note (Signed)
Addended byGwendolyn Grant, Devion Chriscoe H on: 02/25/2018 10:06 AM   Modules accepted: Orders

## 2018-02-25 NOTE — Telephone Encounter (Signed)
Done

## 2018-02-26 ENCOUNTER — Other Ambulatory Visit: Payer: Self-pay | Admitting: Family Medicine

## 2018-03-06 DIAGNOSIS — J449 Chronic obstructive pulmonary disease, unspecified: Secondary | ICD-10-CM | POA: Diagnosis not present

## 2018-03-06 DIAGNOSIS — I5022 Chronic systolic (congestive) heart failure: Secondary | ICD-10-CM | POA: Diagnosis not present

## 2018-03-06 DIAGNOSIS — J441 Chronic obstructive pulmonary disease with (acute) exacerbation: Secondary | ICD-10-CM | POA: Diagnosis not present

## 2018-03-09 ENCOUNTER — Other Ambulatory Visit: Payer: Self-pay | Admitting: Family Medicine

## 2018-03-12 ENCOUNTER — Ambulatory Visit: Payer: Medicare HMO | Admitting: Internal Medicine

## 2018-03-18 ENCOUNTER — Other Ambulatory Visit: Payer: Self-pay | Admitting: Family Medicine

## 2018-03-26 ENCOUNTER — Other Ambulatory Visit: Payer: Self-pay | Admitting: Family Medicine

## 2018-04-04 DIAGNOSIS — I5022 Chronic systolic (congestive) heart failure: Secondary | ICD-10-CM | POA: Diagnosis not present

## 2018-04-04 DIAGNOSIS — J449 Chronic obstructive pulmonary disease, unspecified: Secondary | ICD-10-CM | POA: Diagnosis not present

## 2018-04-04 DIAGNOSIS — J441 Chronic obstructive pulmonary disease with (acute) exacerbation: Secondary | ICD-10-CM | POA: Diagnosis not present

## 2018-04-16 ENCOUNTER — Other Ambulatory Visit: Payer: Self-pay | Admitting: Family Medicine

## 2018-04-16 DIAGNOSIS — R69 Illness, unspecified: Secondary | ICD-10-CM | POA: Diagnosis not present

## 2018-04-17 DIAGNOSIS — R69 Illness, unspecified: Secondary | ICD-10-CM | POA: Diagnosis not present

## 2018-04-20 ENCOUNTER — Ambulatory Visit: Payer: Self-pay | Admitting: Family

## 2018-04-29 ENCOUNTER — Telehealth: Payer: Self-pay

## 2018-04-29 NOTE — Telephone Encounter (Signed)
Attempted to contact pt for COVID pre-screening. Per Dr. [name], if pt is not having any cardiac symptoms, he is ok to reschedule in 3 months.

## 2018-04-30 NOTE — Telephone Encounter (Addendum)
Patient returned call he stated he wasn't having any cardiac symptoms and was ok with rescheduling for June.   Advised patient if he needed anything prior to his appt in June he can call the office.

## 2018-05-03 DIAGNOSIS — I5022 Chronic systolic (congestive) heart failure: Secondary | ICD-10-CM | POA: Diagnosis not present

## 2018-05-03 DIAGNOSIS — J441 Chronic obstructive pulmonary disease with (acute) exacerbation: Secondary | ICD-10-CM | POA: Diagnosis not present

## 2018-05-03 DIAGNOSIS — J449 Chronic obstructive pulmonary disease, unspecified: Secondary | ICD-10-CM | POA: Diagnosis not present

## 2018-05-04 ENCOUNTER — Ambulatory Visit: Payer: Medicare HMO | Admitting: Internal Medicine

## 2018-05-19 ENCOUNTER — Other Ambulatory Visit: Payer: Self-pay | Admitting: Family Medicine

## 2018-05-21 ENCOUNTER — Other Ambulatory Visit: Payer: Self-pay | Admitting: Family Medicine

## 2018-05-26 ENCOUNTER — Other Ambulatory Visit: Payer: Self-pay | Admitting: Family Medicine

## 2018-06-04 DIAGNOSIS — J449 Chronic obstructive pulmonary disease, unspecified: Secondary | ICD-10-CM | POA: Diagnosis not present

## 2018-06-04 DIAGNOSIS — I5022 Chronic systolic (congestive) heart failure: Secondary | ICD-10-CM | POA: Diagnosis not present

## 2018-06-04 DIAGNOSIS — J441 Chronic obstructive pulmonary disease with (acute) exacerbation: Secondary | ICD-10-CM | POA: Diagnosis not present

## 2018-06-26 ENCOUNTER — Other Ambulatory Visit: Payer: Self-pay | Admitting: Family Medicine

## 2018-06-26 DIAGNOSIS — R69 Illness, unspecified: Secondary | ICD-10-CM | POA: Diagnosis not present

## 2018-07-05 ENCOUNTER — Other Ambulatory Visit: Payer: Self-pay | Admitting: Family Medicine

## 2018-07-05 DIAGNOSIS — I5022 Chronic systolic (congestive) heart failure: Secondary | ICD-10-CM | POA: Diagnosis not present

## 2018-07-05 DIAGNOSIS — J441 Chronic obstructive pulmonary disease with (acute) exacerbation: Secondary | ICD-10-CM | POA: Diagnosis not present

## 2018-07-05 DIAGNOSIS — J449 Chronic obstructive pulmonary disease, unspecified: Secondary | ICD-10-CM | POA: Diagnosis not present

## 2018-07-15 ENCOUNTER — Ambulatory Visit: Payer: Self-pay | Admitting: Family

## 2018-07-17 ENCOUNTER — Other Ambulatory Visit: Payer: Self-pay

## 2018-07-17 MED ORDER — LOSARTAN POTASSIUM 25 MG PO TABS: 50.0000 mg | ORAL_TABLET | Freq: Every day | ORAL | 0 refills | Status: DC

## 2018-07-23 DIAGNOSIS — R69 Illness, unspecified: Secondary | ICD-10-CM | POA: Diagnosis not present

## 2018-07-28 ENCOUNTER — Telehealth: Payer: Self-pay | Admitting: Internal Medicine

## 2018-07-28 NOTE — Telephone Encounter (Signed)
LVM for patient to call and confirm appt on 07-29-18.

## 2018-07-29 ENCOUNTER — Ambulatory Visit: Payer: Medicare HMO | Admitting: Internal Medicine

## 2018-08-04 DIAGNOSIS — I5022 Chronic systolic (congestive) heart failure: Secondary | ICD-10-CM | POA: Diagnosis not present

## 2018-08-04 DIAGNOSIS — J449 Chronic obstructive pulmonary disease, unspecified: Secondary | ICD-10-CM | POA: Diagnosis not present

## 2018-08-04 DIAGNOSIS — J441 Chronic obstructive pulmonary disease with (acute) exacerbation: Secondary | ICD-10-CM | POA: Diagnosis not present

## 2018-08-15 DIAGNOSIS — R69 Illness, unspecified: Secondary | ICD-10-CM | POA: Diagnosis not present

## 2018-08-17 ENCOUNTER — Other Ambulatory Visit: Payer: Self-pay | Admitting: *Deleted

## 2018-08-17 MED ORDER — VENLAFAXINE HCL ER 150 MG PO CP24: 150.0000 mg | ORAL_CAPSULE | Freq: Every day | ORAL | 0 refills | Status: DC

## 2018-08-18 ENCOUNTER — Telehealth: Payer: Self-pay

## 2018-08-18 NOTE — Telephone Encounter (Signed)
LVM for patient to schedule an appointment with Dr. Chambliss.  .Hunter Bachar R Demere Dotzler, CMA  

## 2018-08-24 DIAGNOSIS — R69 Illness, unspecified: Secondary | ICD-10-CM | POA: Diagnosis not present

## 2018-09-04 DIAGNOSIS — J449 Chronic obstructive pulmonary disease, unspecified: Secondary | ICD-10-CM | POA: Diagnosis not present

## 2018-09-04 DIAGNOSIS — J441 Chronic obstructive pulmonary disease with (acute) exacerbation: Secondary | ICD-10-CM | POA: Diagnosis not present

## 2018-09-04 DIAGNOSIS — I5022 Chronic systolic (congestive) heart failure: Secondary | ICD-10-CM | POA: Diagnosis not present

## 2018-09-09 ENCOUNTER — Encounter: Payer: Self-pay | Admitting: Family Medicine

## 2018-09-09 ENCOUNTER — Telehealth (INDEPENDENT_AMBULATORY_CARE_PROVIDER_SITE_OTHER): Payer: Medicare HMO | Admitting: Family Medicine

## 2018-09-09 ENCOUNTER — Other Ambulatory Visit: Payer: Self-pay

## 2018-09-09 DIAGNOSIS — F1020 Alcohol dependence, uncomplicated: Secondary | ICD-10-CM

## 2018-09-09 DIAGNOSIS — F4321 Adjustment disorder with depressed mood: Secondary | ICD-10-CM | POA: Diagnosis not present

## 2018-09-09 DIAGNOSIS — R69 Illness, unspecified: Secondary | ICD-10-CM | POA: Diagnosis not present

## 2018-09-09 DIAGNOSIS — I10 Essential (primary) hypertension: Secondary | ICD-10-CM

## 2018-09-09 DIAGNOSIS — F17200 Nicotine dependence, unspecified, uncomplicated: Secondary | ICD-10-CM

## 2018-09-09 DIAGNOSIS — I4891 Unspecified atrial fibrillation: Secondary | ICD-10-CM

## 2018-09-09 DIAGNOSIS — G6289 Other specified polyneuropathies: Secondary | ICD-10-CM

## 2018-09-09 DIAGNOSIS — J479 Bronchiectasis, uncomplicated: Secondary | ICD-10-CM

## 2018-09-09 MED ORDER — CLONAZEPAM 0.5 MG PO TABS: 0.5000 mg | ORAL_TABLET | Freq: Two times a day (BID) | ORAL | 4 refills | Status: DC | PRN

## 2018-09-09 MED ORDER — COMBIVENT RESPIMAT 20-100 MCG/ACT IN AERS: INHALATION_SPRAY | RESPIRATORY_TRACT | 2 refills | Status: DC

## 2018-09-09 NOTE — Assessment & Plan Note (Signed)
Sounds stable.  Refilled his klonopin.

## 2018-09-09 NOTE — Assessment & Plan Note (Signed)
Stable.Marland Kitchen  He was referred to pulmonary but has not attempted due to [facility]

## 2018-09-09 NOTE — Progress Notes (Signed)
[place] Telemedicine Visit  Patient consented to have virtual visit. Method of visit: Telephone  Encounter participants: Patient: Juan [name] - located at home Provider: Lind Covert - located at office Others (if applicable): Wife Psychologist, educational Complaint: refills  HPI:  ADJUSTMENT DO  Feel that this is stable.  Takes klonopin twice a day for years.  He is concerned about Covid but functioning at home ok  CHF Sleeps on several pillows on his side.  No edema and no weight gain.   Feels his breathing is stable but is tired a lot Taking his medications per list  AFIB Does not think he has been in afib.  No chest pain or new lightheadness   TOBACCO Has decreased from 1.5 to about 1 ppd.  Wants to continue   BRONCHIECTASIS Feels stable.   Using his inhalers and nebs.  No recent exacerbations or fever  ALCOHOL Has cut back to about 6-8 beers per day.  No longer drinks bourbon.  Wine about 2 x a month.  No other drugs.    ROS: per HPI  Pertinent PMHx: Frequent ER use - none since October  Exam:  Respiratory: raspy sounding voice Psych:  Cognition and judgment appear intact. Alert, communicative  and cooperative with normal attention span and concentration. No apparent delusions, illusions, hallucinations  Reported vs Pox 95%, 152/87 90  Assessment/Plan:  Adjustment disorder with depressed mood Sounds stable.  Refilled his klonopin.    Atrial fibrillation with RVR (HCC) Does not sound like his has been in afib.  The monitor his pulse ox and blood pressure   Bronchiectasis without acute exacerbation (HCC) Stable.Marland Kitchen  He was referred to pulmonary but has not attempted due to Covid  HTN (hypertension) Elevated today.  Asked them to check regularly and contact me if > 140/90  Peripheral neuropathy Stable.  He has cut back his gabapentin to twice a day.  He is not sure if helps.  Recommend wean down to see if makes any difference    Smoker unmotivated to quit Reports improvement.  Encouraged to continue cessation efforts   Alcohol use disorder, severe, dependence ([place]) Reports decreased use.  Encouraged to continue     Time spent during visit with patient: 21 minutes

## 2018-09-09 NOTE — Assessment & Plan Note (Signed)
Reports decreased use.  Encouraged to continue

## 2018-09-09 NOTE — Assessment & Plan Note (Signed)
Stable.  He has cut back his gabapentin to twice a day.  He is not sure if helps.  Recommend wean down to see if makes any difference

## 2018-09-09 NOTE — Assessment & Plan Note (Signed)
Elevated today.  Asked them to check regularly and contact me if > 140/90

## 2018-09-09 NOTE — Assessment & Plan Note (Signed)
Reports improvement.  Encouraged to continue cessation efforts

## 2018-09-09 NOTE — Assessment & Plan Note (Signed)
Does not sound like his has been in afib.  The monitor his pulse ox and blood pressure

## 2018-09-11 ENCOUNTER — Encounter: Payer: Self-pay | Admitting: Family Medicine

## 2018-10-05 DIAGNOSIS — I5022 Chronic systolic (congestive) heart failure: Secondary | ICD-10-CM | POA: Diagnosis not present

## 2018-10-05 DIAGNOSIS — J441 Chronic obstructive pulmonary disease with (acute) exacerbation: Secondary | ICD-10-CM | POA: Diagnosis not present

## 2018-10-05 DIAGNOSIS — J449 Chronic obstructive pulmonary disease, unspecified: Secondary | ICD-10-CM | POA: Diagnosis not present

## 2018-10-31 ENCOUNTER — Other Ambulatory Visit: Payer: Self-pay | Admitting: Family Medicine

## 2018-11-02 ENCOUNTER — Other Ambulatory Visit: Payer: Self-pay | Admitting: Internal Medicine

## 2018-11-04 ENCOUNTER — Other Ambulatory Visit: Payer: Self-pay

## 2018-11-04 DIAGNOSIS — J441 Chronic obstructive pulmonary disease with (acute) exacerbation: Secondary | ICD-10-CM | POA: Diagnosis not present

## 2018-11-04 DIAGNOSIS — I5022 Chronic systolic (congestive) heart failure: Secondary | ICD-10-CM | POA: Diagnosis not present

## 2018-11-04 DIAGNOSIS — J449 Chronic obstructive pulmonary disease, unspecified: Secondary | ICD-10-CM | POA: Diagnosis not present

## 2018-11-04 MED ORDER — LOSARTAN POTASSIUM 25 MG PO TABS: 50.0000 mg | ORAL_TABLET | Freq: Every day | ORAL | 1 refills | Status: DC

## 2018-11-17 ENCOUNTER — Other Ambulatory Visit: Payer: Self-pay | Admitting: Family Medicine

## 2018-12-05 DIAGNOSIS — I5022 Chronic systolic (congestive) heart failure: Secondary | ICD-10-CM | POA: Diagnosis not present

## 2018-12-05 DIAGNOSIS — J449 Chronic obstructive pulmonary disease, unspecified: Secondary | ICD-10-CM | POA: Diagnosis not present

## 2018-12-05 DIAGNOSIS — J441 Chronic obstructive pulmonary disease with (acute) exacerbation: Secondary | ICD-10-CM | POA: Diagnosis not present

## 2018-12-09 DIAGNOSIS — J449 Chronic obstructive pulmonary disease, unspecified: Secondary | ICD-10-CM | POA: Diagnosis not present

## 2018-12-09 DIAGNOSIS — J441 Chronic obstructive pulmonary disease with (acute) exacerbation: Secondary | ICD-10-CM | POA: Diagnosis not present

## 2018-12-09 DIAGNOSIS — I5022 Chronic systolic (congestive) heart failure: Secondary | ICD-10-CM | POA: Diagnosis not present

## 2018-12-24 ENCOUNTER — Other Ambulatory Visit: Payer: Self-pay | Admitting: Internal Medicine

## 2019-01-20 ENCOUNTER — Other Ambulatory Visit: Payer: Self-pay | Admitting: Family Medicine

## 2019-01-20 DIAGNOSIS — R69 Illness, unspecified: Secondary | ICD-10-CM | POA: Diagnosis not present

## 2019-02-06 ENCOUNTER — Other Ambulatory Visit: Payer: Self-pay | Admitting: Family Medicine

## 2019-04-01 ENCOUNTER — Other Ambulatory Visit: Payer: Self-pay

## 2019-04-01 MED ORDER — GABAPENTIN 300 MG PO CAPS: ORAL_CAPSULE | ORAL | 1 refills | Status: DC

## 2019-04-09 ENCOUNTER — Other Ambulatory Visit: Payer: Self-pay | Admitting: *Deleted

## 2019-04-12 ENCOUNTER — Other Ambulatory Visit: Payer: Self-pay | Admitting: *Deleted

## 2019-04-12 MED ORDER — CLONAZEPAM 0.5 MG PO TABS: 0.5000 mg | ORAL_TABLET | Freq: Two times a day (BID) | ORAL | 1 refills | Status: DC | PRN

## 2019-04-13 ENCOUNTER — Other Ambulatory Visit: Payer: Self-pay | Admitting: Internal Medicine

## 2019-05-01 ENCOUNTER — Other Ambulatory Visit: Payer: Self-pay | Admitting: Family Medicine

## 2019-05-12 ENCOUNTER — Other Ambulatory Visit: Payer: Self-pay | Admitting: Internal Medicine

## 2019-05-24 ENCOUNTER — Ambulatory Visit: Payer: Medicare HMO | Admitting: Internal Medicine

## 2019-05-28 ENCOUNTER — Other Ambulatory Visit: Payer: Self-pay | Admitting: *Deleted

## 2019-05-29 MED ORDER — FUROSEMIDE 20 MG PO TABS: 20.0000 mg | ORAL_TABLET | Freq: Every day | ORAL | 2 refills | Status: DC

## 2019-06-02 ENCOUNTER — Other Ambulatory Visit: Payer: Self-pay | Admitting: *Deleted

## 2019-06-02 MED ORDER — FUROSEMIDE 20 MG PO TABS: 20.0000 mg | ORAL_TABLET | Freq: Every day | ORAL | 1 refills | Status: DC

## 2019-06-03 ENCOUNTER — Other Ambulatory Visit: Payer: Self-pay | Admitting: Internal Medicine

## 2019-06-05 DIAGNOSIS — R69 Illness, unspecified: Secondary | ICD-10-CM | POA: Diagnosis not present

## 2019-06-07 ENCOUNTER — Other Ambulatory Visit: Payer: Self-pay | Admitting: Family Medicine

## 2019-06-07 ENCOUNTER — Other Ambulatory Visit: Payer: Self-pay

## 2019-06-07 DIAGNOSIS — R69 Illness, unspecified: Secondary | ICD-10-CM | POA: Diagnosis not present

## 2019-06-07 MED ORDER — BUDESONIDE 0.5 MG/2ML IN SUSP: RESPIRATORY_TRACT | 3 refills | Status: DC

## 2019-06-07 MED ORDER — IPRATROPIUM BROMIDE 0.02 % IN SOLN: 0.5000 mg | Freq: Four times a day (QID) | RESPIRATORY_TRACT | 3 refills | Status: DC | PRN

## 2019-06-08 ENCOUNTER — Other Ambulatory Visit: Payer: Self-pay | Admitting: Family Medicine

## 2019-06-11 ENCOUNTER — Other Ambulatory Visit: Payer: Self-pay | Admitting: Family Medicine

## 2019-06-14 ENCOUNTER — Telehealth (INDEPENDENT_AMBULATORY_CARE_PROVIDER_SITE_OTHER): Payer: Medicare HMO | Admitting: Internal Medicine

## 2019-06-14 ENCOUNTER — Telehealth: Payer: Self-pay | Admitting: Internal Medicine

## 2019-06-14 ENCOUNTER — Encounter: Payer: Self-pay | Admitting: Internal Medicine

## 2019-06-14 VITALS — BP 137/85 | HR 91 | Temp 97.7°F | Ht 68.0 in | Wt 211.0 lb

## 2019-06-14 DIAGNOSIS — I480 Paroxysmal atrial fibrillation: Secondary | ICD-10-CM

## 2019-06-14 DIAGNOSIS — I10 Essential (primary) hypertension: Secondary | ICD-10-CM | POA: Diagnosis not present

## 2019-06-14 DIAGNOSIS — I426 Alcoholic cardiomyopathy: Secondary | ICD-10-CM

## 2019-06-14 DIAGNOSIS — E785 Hyperlipidemia, unspecified: Secondary | ICD-10-CM | POA: Diagnosis not present

## 2019-06-14 DIAGNOSIS — Z79899 Other long term (current) drug therapy: Secondary | ICD-10-CM | POA: Diagnosis not present

## 2019-06-14 DIAGNOSIS — R69 Illness, unspecified: Secondary | ICD-10-CM | POA: Diagnosis not present

## 2019-06-14 DIAGNOSIS — I428 Other cardiomyopathies: Secondary | ICD-10-CM

## 2019-06-14 NOTE — Telephone Encounter (Signed)
  Patient Consent for Virtual Visit         Juan [name] has provided verbal consent on 06/14/2019 for a virtual visit (video or telephone).  E-visit completed 06/14/2019   CONSENT FOR VIRTUAL VISIT FOR:  Juan [name]  By participating in this virtual visit I agree to the following:  I hereby voluntarily request, consent and authorize [facility] and its employed or contracted physicians, physician assistants, nurse practitioners or other licensed health care professionals (the Practitioner), to provide me with telemedicine health care services (the "Services") as deemed necessary by the treating Practitioner. I acknowledge and consent to receive the Services by the Practitioner via telemedicine. I understand that the telemedicine visit will involve communicating with the Practitioner through live audiovisual communication technology and the disclosure of certain medical information by electronic transmission. I acknowledge that I have been given the opportunity to request an in-person assessment or other available alternative prior to the telemedicine visit and am voluntarily participating in the telemedicine visit.  I understand that I have the right to withhold or withdraw my consent to the use of telemedicine in the course of my care at any time, without affecting my right to future care or treatment, and that the Practitioner or I may terminate the telemedicine visit at any time. I understand that I have the right to inspect all information obtained and/or recorded in the course of the telemedicine visit and may receive copies of available information for a reasonable fee.  I understand that some of the potential risks of receiving the Services via telemedicine include:  Marland Kitchen Delay or interruption in medical evaluation due to technological equipment failure or disruption; . Information transmitted may not be sufficient (e.g. poor resolution of images) to allow for appropriate medical  decision making by the Practitioner; and/or  . In rare instances, security protocols could fail, causing a breach of personal health information.  Furthermore, I acknowledge that it is my responsibility to provide information about my medical history, conditions and care that is complete and accurate to the best of my ability. I acknowledge that Practitioner's advice, recommendations, and/or decision may be based on factors not within their control, such as incomplete or inaccurate data provided by me or distortions of diagnostic images or specimens that may result from electronic transmissions. I understand that the practice of medicine is not an exact science and that Practitioner makes no warranties or guarantees regarding treatment outcomes. I acknowledge that a copy of this consent can be made available to me via my patient portal [facility] MyChart), or I can request a printed copy by calling the office of [facility].    I understand that my insurance will be billed for this visit.   I have read or had this consent read to me. . I understand the contents of this consent, which adequately explains the benefits and risks of the Services being provided via telemedicine.  . I have been provided ample opportunity to ask questions regarding this consent and the Services and have had my questions answered to my satisfaction. . I give my informed consent for the services to be provided through the use of telemedicine in my medical care

## 2019-06-14 NOTE — Progress Notes (Signed)
Virtual Visit via Telephone Note   This visit type was conducted due to national recommendations for restrictions regarding the COVID-19 Pandemic (e.g. social distancing) in an effort to limit this patient's exposure and mitigate transmission in our community.  Due to his co-morbid illnesses, this patient is at least at moderate risk for complications without adequate follow up.  This format is felt to be most appropriate for this patient at this time.  The patient did not have access to video technology/had technical difficulties with video requiring transitioning to audio format only (telephone).  All issues noted in this document were discussed and addressed.  No physical exam could be performed with this format.  Please refer to the patient's chart for his  consent to telehealth for [facility].   Evaluation Performed:  Telephone follow-up  Date:  06/14/2019   ID:  [name], DOB 1950-12-23, MRN [id number]  Patient Location:  [address] [place] [address]  Provider location:   [address] [place], [place] [address]  PCP:  Carney Living, MD  Cardiologist:  Chrystie Nose, MD Electrophysiologist:  None   Chief Complaint:  Fatigue, tired  History of Present Illness:    Juan [name] is a 69 y.o. male who presents via audio/video conferencing for a telehealth visit today.  Juan [name] is a 69 yo male with a history of heavy drinking for the past 15 years he has been drinking 4-5 drinks a day. He presented to [facility] after seizure like activity. Wife noted rhythmic jerking bilaterally for about 30 seconds. He was unresponsive and post ictal afterwards for about 30 min. Did not bite his tongue, no bladder or bowel incontinence. No history of head injury recently. 2 years ago he had a similar episode after abstaining from alcohol and stopping his klonopin suddenly. This time wife thinks he has been taking his medications. Of  note his Zoloft has recently been changed to Effexor. He also presented with an aspiration pneumonia and was treated for such. It was felt that he had acute on chronic systolic heart failure exacerbation. An echocardiogram was performed which demonstrated a severe cardiomyopathy with global hypokinesis and EF of 30-35%.  He underwent a nuclear stress test which did not demonstrate any signs of ischemia therefore was thought that he had a nonischemic cardiomyopathy possibly related to alcohol use.   He was diuresed and then placed on appropriate heart failure medications. A life vest was then placed because he had some episodes of nonsustained ventricular tachycardia in a hospital. It was thought this may be due to a metabolic derangement. Since discharge she has been clinically stable. His weight has remained stable and he has had no further signs of heart failure. He denies any shocks with a life vest. He does report that it may be hurting his back somewhat. He has been abstinent from alcohol since his hospitalization. His only other main concern is that he has history of anxiety and felt that he did better on Zoloft and Effexor versus Effexor alone. He wishes to discuss this with his psychiatrist, but is apparently taken off of Zoloft due to hyponatremia. I suspect this may be due to heart failure, not the medication. If he does feel that he did better on Zoloft it would be reasonable to consider restarting that. I think anything we can do to keep him from drinking would be best for his heart.  06/14/2019  Juan [name] is seen today  for follow-up.  I have not seen him since 2016 but it was more recently seen by Doreene Adas, PA-C in 2019.  At the time he had been fairly stable with regards to cardiomyopathy.  His LVEF was last assessed at 30 to 35%.  He does have a history of alcohol use which is continuous as well as tobacco use.  His wife noted more recently he has been fatigued a lot and sleeps.  He does not  have any reported heavy snoring or apneic episodes.  He did have a history of paroxysmal A. fib but has not been anticoagulated because of the alcohol use and bleeding risk.  He says several months ago he had noted that his heart rate had been erratic however over the past couple months has been very stable with heart rates between 70 and 90.  He has had weight gain but denies any worsening swelling.  He reports shortness of breath with moderate exertion, more consistent with NYHA class II-III symptoms.  He has reported a sore throat for the past several days but no fever, chills cough or other signs or symptoms of infection.  The patient does not have symptoms concerning for COVID-19 infection (fever, chills, cough, or new SHORTNESS OF BREATH).    Prior CV studies:   The following studies were reviewed today:  Chart reviewed  PMHx:  Past Medical History:  Diagnosis Date  . Alcoholism ([place])   . Allergy    seasonal   . Anxiety   . Asthma   . CHF (congestive heart failure) ([place]) 10/2010   ECHO:  EF 40%, Grade II diastolic dysfunction  . Chronic diastolic heart failure ([place])   . COPD (chronic obstructive pulmonary disease) ([place])   . COPD exacerbation ([place])   . Depression   . Headache   . Hyperlipidemia   . Hypertension   . Lung nodule seen on imaging study 06/15/2013   Needs follow-up in 3 months (early 2017)   . RBBB 11/09/2012  . Seizures ([place])   . Tremor 07/30/2011   Likely secondary to long-term alcohol abuse. Did discuss how his tremors affecting his alcoholism. We also switching her from albuterol to Atrovent in case this is causing any worsening of his tremor.   Marland Kitchen Umbilical hernia 4/0/0867    Past Surgical History:  Procedure Laterality Date  . TOENAIL EXCISION Right 1967   Removal of ingrown toenail [Other]    FAMHx:  Family History  Problem Relation Age of Onset  . Alzheimer's disease Mother   . Diabetes Mother   . Heart disease Maternal Uncle     SOCHx:   reports  that he has been smoking cigarettes. He has a 60.00 pack-year smoking history. He has never used smokeless tobacco. He reports current alcohol use. He reports that he does not use drugs.  ALLERGIES:  Allergies  Allergen Reactions  . Coreg [Carvedilol] Shortness Of Breath    Shortness of breath with rechallenge 10/01/17  . Aspartame And Phenylalanine Nausea And Vomiting    MEDS:  Current Meds  Medication Sig  . albuterol (PROVENTIL) (2.5 MG/3ML) 0.083% nebulizer solution INHALE 1 VIAL VIA NEBULIZER EVERY 6 HOURS AS NEEDED FOR WHEEZING OR SHORTNESS OF BREATH  . aspirin 81 MG chewable tablet Chew 1 tablet (81 mg total) by mouth daily.  . budesonide (PULMICORT) 0.5 MG/2ML nebulizer solution INHALE 1 VIAL VIA NEBULIZER TWICE A DAY (PA REQ)  . cholecalciferol (VITAMIN D) 1000 units tablet TAKE 1 TABLET BY MOUTH EVERY DAY  .  clonazePAM (KLONOPIN) 0.5 MG tablet Take 1 tablet (0.5 mg total) by mouth 2 (two) times daily as needed. Pls make an appointment for an office visit  . COMBIVENT RESPIMAT 20-100 MCG/ACT AERS respimat INHALE 1 TO 2 PUFFS BY MOUTH EVERY 4 HOURS AS NEEDED FOR WHEEZE PLEASE PROVIDE 2 INHALERS  . EPINEPHrine Base (PRIMATENE MIST IN) Inhale 1 puff into the lungs as needed (for breathing).  . furosemide (LASIX) 20 MG tablet Take 1 tablet (20 mg total) by mouth daily. PLEASE COME IN FOR A VISIT  . gabapentin (NEURONTIN) 300 MG capsule TAKE 1 CAPSULE BY MOUTH THREE TIMES A DAY.  Please make an appointment for inperson visit  . ipratropium (ATROVENT) 0.02 % nebulizer solution Take 2.5 mLs (0.5 mg total) by nebulization every 6 (six) hours as needed for wheezing or shortness of breath. Please dispense QS x 1 month  . losartan (COZAAR) 50 MG tablet TAKE 1 TABLET (50MG ) EVERY DAY  . metoprolol succinate (TOPROL-XL) 25 MG 24 hr tablet Take 1.5 tablets (37.5 mg total) by mouth daily. Please keep upcoming appt for refills. Thank you  . Multiple Vitamin (MULTIVITAMIN WITH MINERALS) TABS tablet  Take 1 tablet by mouth daily as needed (for vitamin).   . phenylephrine (4-WAY FAST ACTING) 1 % nasal spray Place 1 drop into both nostrils every 6 (six) hours as needed for congestion (4 way nasal spray).  . sodium chloride (OCEAN) 0.65 % SOLN nasal spray Place 1 spray into both nostrils as needed for congestion.  venlafaxine XR (EFFEXOR-XR) 150 MG 24 hr capsule TAKE 1 CAPSULE (150 MG TOTAL) BY MOUTH DAILY WITH BREAKFAST.     ROS: Pertinent items noted in HPI and remainder of comprehensive ROS otherwise negative.  Labs/Other Tests and Data Reviewed:    Recent Labs: No results found for requested labs within last 8760 hours.   Recent Lipid Panel Lab Results  Component Value Date/Time   CHOL 207 (H) 04/08/2017 12:26 PM   TRIG 96 04/08/2017 12:26 PM   HDL 40 04/08/2017 12:26 PM   CHOLHDL 5.2 (H) 04/08/2017 12:26 PM   CHOLHDL 4.4 02/13/2016 02:46 PM   LDLCALC 148 (H) 04/08/2017 12:26 PM   LDLDIRECT 74 07/20/2012 11:24 AM    Wt Readings from Last 3 Encounters:  06/14/19 211 lb (95.7 kg)  11/18/17 202 lb (91.6 kg)  11/11/17 198 lb 6.6 oz (90 kg)     Exam:    Vital Signs:  BP 137/85   Pulse 91   Temp 97.7 F (36.5 C)   Ht 5\' 8"  (1.727 m)   Wt 211 lb (95.7 kg)   BMI 32.08 kg/m    Exam not performed due to telephone visit  ASSESSMENT & PLAN:    1. Nonischemic (probably alcoholic) cardiomyopathy, EF 30-35% (09/2017) 2. Essential hypertension 3. Ongoing alcohol abuse, history of withdrawal seizure 4. Tobacco abuse 5. History of anxiety 6. PAF-CHA2DS2-VASc score of 3, not on anticoagulation due to alcohol and fall risk  Mr. Strite was seen today via telephone visit.  He and his wife have reported some fatigue and sleepiness but no worsening shortness of breath.  He reports his heart rate is been fairly stable and does not feel like he has had any recent A. fib.  Blood pressure is minimally elevated.  Weight is up some.  I would like to repeat an echo since its been 2  years since his last study to see if his EF has been stable or is worse.  We  discussed tobacco cessation as well as alcohol moderation.  Will obtain lab work as well and plan follow-up with me in the office in about 1 to 2 months.  COVID-19 Education: The signs and symptoms of COVID-19 were discussed with the patient and how to seek care for testing (follow up with PCP or arrange E-visit).  The importance of social distancing was discussed today.  Patient Risk:   After full review of this patients clinical status, I feel that they are at least moderate risk at this time.  Time:   Today, I have spent 25 minutes with the patient with telehealth technology discussing heart failure, hypertension, tobacco and alcohol use, atrial fibrillation.     Medication Adjustments/Labs and Tests Ordered: Current medicines are reviewed at length with the patient today.  Concerns regarding medicines are outlined above.   Tests Ordered: Orders Placed This Encounter  Procedures  . Brain natriuretic peptide  . Comprehensive metabolic panel  . CBC  . Lipid panel  . Magnesium  . ECHOCARDIOGRAM COMPLETE    Medication Changes: No orders of the defined types were placed in this encounter.   Disposition:  in 2 month(s)  Chrystie Nose, MD, [facility], FACP    [facility]  Medical Director of the Advanced Lipid Disorders &  Cardiovascular Risk Reduction Clinic Diplomate of the American Board of Clinical Lipidology Attending Cardiologist  Direct Dial: [phone]  Fax: [phone]  Website:  [email or web address]  Chrystie Nose, MD  06/14/2019 9:30 AM

## 2019-06-14 NOTE — Telephone Encounter (Signed)
New Message  Called patient to schedule his ECHO and his follow up visit.  Please call

## 2019-06-14 NOTE — Telephone Encounter (Signed)
Message routed to NL scheduling/PCC pool to arrange echo & follow up

## 2019-06-14 NOTE — Telephone Encounter (Signed)
-----   Message from Lindell Spar, RN sent at 06/14/2019  8:35 AM EDT ----- Regarding: echo & f/u Hello --   This patient needs to be scheduled for echocardiogram and then follow up in office with Dr. [name] or APP on care team in 1-2 months  He also needs fasting labs, and if he wants them done same day as echo, please let me know so I can change order & schedule lab appointment.   Thanks !

## 2019-06-14 NOTE — Patient Instructions (Signed)
Medication Instructions:  Your physician recommends that you continue on your current medications as directed. Please refer to the Current Medication list given to you today.  *If you need a refill on your cardiac medications before your next appointment, please call your pharmacy*   Lab Work: FASTING lab work - CBC, CMET, Magnesium, BNP, Lipid Panel This can be done @ Dr. Blanchie Dessert office ([address]) OR if you prefer to do same day as your echo, please schedule echo in AM and lab work can be done at that office   If you have labs (blood work) drawn today and your tests are completely normal, you will receive your results only by: Marland Kitchen MyChart Message (if you have MyChart) OR . A paper copy in the mail If you have any lab test that is abnormal or we need to change your treatment, we will call you to review the results.   Testing/Procedures: Echocardiogram to be scheduled @ [address]   Follow-Up: At [facility], you and your health needs are our priority.  As part of our continuing mission to provide you with exceptional heart care, we have created designated Provider Care Teams.  These Care Teams include your primary Cardiologist (physician) and Advanced Practice Providers (APPs -  Physician Assistants and Nurse Practitioners) who all work together to provide you with the care you need, when you need it.  We recommend signing up for the patient portal called "MyChart".  Sign up information is provided on this After Visit Summary.  MyChart is used to connect with patients for Virtual Visits (Telemedicine).  Patients are able to view lab/test results, encounter notes, upcoming appointments, etc.  Non-urgent messages can be sent to your provider as well.   To learn more about what you can do with MyChart, go to [email or web address].    Your next appointment:   1-2 month(s)  The format for your next appointment:   In Person  Provider:   You may  see Chrystie Nose, MD or one of the following Advanced Practice Providers on your designated Care Team:    Azalee Course, PA-C  Micah Flesher, PA-C or   Judy Pimple, [place]    Other Instructions

## 2019-06-15 NOTE — Telephone Encounter (Signed)
Left message for patient to call and schedule follow up appointment with Dr. [name] after Echo on 07/06/19

## 2019-06-24 NOTE — Telephone Encounter (Signed)
Left message for patient to call and schedule follow up appointment with Dr. [name] after his Echo

## 2019-07-06 ENCOUNTER — Other Ambulatory Visit (HOSPITAL_COMMUNITY): Payer: Medicare HMO

## 2019-07-09 ENCOUNTER — Other Ambulatory Visit: Payer: Self-pay | Admitting: Family Medicine

## 2019-07-09 NOTE — Telephone Encounter (Signed)
Left message for patient to call to reschedule Echo from 07/06/19 and schedule a follow up appointment with Dr. [name]

## 2019-07-15 ENCOUNTER — Other Ambulatory Visit: Payer: Self-pay | Admitting: Family Medicine

## 2019-07-19 ENCOUNTER — Other Ambulatory Visit: Payer: Self-pay | Admitting: Family Medicine

## 2019-07-19 NOTE — Telephone Encounter (Signed)
Patient cancelled the 07/06/19 appointment for the Echo and did not want to rechedule

## 2019-07-26 ENCOUNTER — Encounter (HOSPITAL_COMMUNITY): Payer: Self-pay | Admitting: Internal Medicine

## 2019-07-29 ENCOUNTER — Telehealth (HOSPITAL_COMMUNITY): Payer: Self-pay | Admitting: Internal Medicine

## 2019-07-29 NOTE — Telephone Encounter (Signed)
Patient called me regarding the echo letter that I sent him to call office to schedule.  He has declined to schedule at this time. He wants to see his PCP first cause he has other issues that are more important at this time. Order will be removed from the WQ and if patient calls back at a later date we can reinstate order.

## 2019-08-04 ENCOUNTER — Other Ambulatory Visit: Payer: Self-pay | Admitting: Family Medicine

## 2019-08-16 ENCOUNTER — Other Ambulatory Visit: Payer: Self-pay

## 2019-08-16 MED ORDER — CLONAZEPAM 0.5 MG PO TABS: ORAL_TABLET | ORAL | 0 refills | Status: DC

## 2019-08-16 MED ORDER — METOPROLOL SUCCINATE ER 25 MG PO TB24: 37.5000 mg | ORAL_TABLET | Freq: Every day | ORAL | 0 refills | Status: DC

## 2019-08-17 ENCOUNTER — Ambulatory Visit: Payer: Medicare HMO | Admitting: Family Medicine

## 2019-08-24 ENCOUNTER — Ambulatory Visit: Payer: Medicare HMO | Admitting: Family Medicine

## 2019-08-31 ENCOUNTER — Telehealth: Payer: Self-pay | Admitting: Internal Medicine

## 2019-08-31 MED ORDER — METOPROLOL SUCCINATE ER 25 MG PO TB24: 37.5000 mg | ORAL_TABLET | Freq: Every day | ORAL | 3 refills | Status: DC

## 2019-08-31 NOTE — Telephone Encounter (Signed)
[name] aware refill sent as requested and a follow up appt scheduled and instructed to get labs and echo done prior to this appt ./cy

## 2019-08-31 NOTE — Telephone Encounter (Signed)
Pt c/o medication issue:  1. Name of Medication: metoprolol succinate (TOPROL-XL) 25 MG 24 hr tablet  2. How are you currently taking this medication (dosage and times per day)? As written  3. Are you having a reaction (difficulty breathing--STAT)? No  4. What is your medication issue? Patient needs a new prescription. Have a few days left before medication runs out

## 2019-09-05 ENCOUNTER — Other Ambulatory Visit: Payer: Self-pay | Admitting: Family Medicine

## 2019-09-06 ENCOUNTER — Other Ambulatory Visit: Payer: Self-pay | Admitting: Family Medicine

## 2019-09-08 ENCOUNTER — Other Ambulatory Visit: Payer: Self-pay

## 2019-09-08 ENCOUNTER — Encounter: Payer: Self-pay | Admitting: Family Medicine

## 2019-09-08 ENCOUNTER — Ambulatory Visit: Payer: Medicare HMO | Admitting: Family Medicine

## 2019-09-08 ENCOUNTER — Ambulatory Visit (INDEPENDENT_AMBULATORY_CARE_PROVIDER_SITE_OTHER): Payer: Medicare HMO | Admitting: Family Medicine

## 2019-09-08 VITALS — BP 131/80 | HR 91 | Ht 68.0 in | Wt 208.4 lb

## 2019-09-08 DIAGNOSIS — J479 Bronchiectasis, uncomplicated: Secondary | ICD-10-CM

## 2019-09-08 DIAGNOSIS — F4321 Adjustment disorder with depressed mood: Secondary | ICD-10-CM

## 2019-09-08 DIAGNOSIS — Z720 Tobacco use: Secondary | ICD-10-CM | POA: Diagnosis not present

## 2019-09-08 DIAGNOSIS — Z23 Encounter for immunization: Secondary | ICD-10-CM

## 2019-09-08 DIAGNOSIS — F1020 Alcohol dependence, uncomplicated: Secondary | ICD-10-CM

## 2019-09-08 DIAGNOSIS — K429 Umbilical hernia without obstruction or gangrene: Secondary | ICD-10-CM | POA: Diagnosis not present

## 2019-09-08 DIAGNOSIS — R69 Illness, unspecified: Secondary | ICD-10-CM | POA: Diagnosis not present

## 2019-09-08 MED ORDER — COMBIVENT RESPIMAT 20-100 MCG/ACT IN AERS: INHALATION_SPRAY | RESPIRATORY_TRACT | 6 refills | Status: DC

## 2019-09-08 NOTE — Progress Notes (Signed)
PHQ9 recorded after provider viewed.   Glennie Hawk, CMA

## 2019-09-08 NOTE — Patient Instructions (Signed)
Good to see you today - Thanks for coming in  Make an appointment to see Dr Raymondo Band here for smoking cessation  I put in referral to pulmonary medicine.  They should call you  I will review your blood tests from cardiology  Come back to see me in 1-6 months    Psychiatry Resource List (Adults and Children)  [facility]  [address] [place], [place] [phone] Crisis [phone]   [facility]:   [facility]: [address]     [phone]   [facility]: [address] [place],        [phone] [place]: [address],    [phone] [place]ryne Sharper: [phone] [place],                   [phone] Children: [facility] [address]         [phone]   [facility]  (Psychiatry only; Adults /children 12 and over, will take Medicaid)  [address] [place]urell Josephs [address], [place], [place] [address]       [phone]   [facility] (Psychiatry & counseling ; adults & children ; will take Medicaid [address]  [place] [address]   Go on-line to complete referral ( [email or web address] [phone]    (Spanish therapist)  [facility]  Psychiatry & counseling; Adults and children;  Call Registration prior to scheduling an appointment [phone] [address]    [place], [place] [address]    [phone]  [facility] (Psychiatry & counseling; adults & children; Medicare no Medicaid)  [address]   [place], [place]  [address]      [phone]    [facility] (up to age 72)  Psychiatry & counseling ,will take Medicaid, must do counseling to receive psychiatry services  [address] [place] [address]        [phone]  [facility] (Psychiatry & counseling; adults & children; will take  Medicaid) Will need a referral from provider [address],  [place], [place]  [phone]   [facility] --- Walk-In Mon-Friday 8am-3pm ( will take Medicaid, Psychiatry, Adults & children,  [address], [place], [place]   [phone]   [facility], Walk-in M-F 8am-12pm and 1pm -3pm   (Counseling, Psychiatry, will take Medicaid, adults & children)  [address], [place], [place]  [phone]

## 2019-09-09 NOTE — Assessment & Plan Note (Signed)
Large.  Less likely to incarcerate given size.  Did discuss warning signs - pain, hardness, persistent nausea and vomiting.  Not currently a surgical candidate given all other problems

## 2019-09-09 NOTE — Progress Notes (Signed)
    SUBJECTIVE:   CHIEF COMPLAINT / HPI:   Juan [name] comes in after more than a year isolating with covid.  He has had one vaccine  PULMONARY He is on a large number of breathing medications (see medication list which I updated) he was to see a pulmonologist but then covid occurred.  He continues to smoke but wishes to quit.  He uses home oxygen with exertion  TOBACCO ABUSE Smokes 1 ppd.  Would like to cut down.  Will to consider nicotine replacement and perhaps Chantix  ANXIETY He and wife describe this as his worst problem and it is worsening.  Takes medications as per list.   Wishes to increase his Klonopin.  The reason he came in for visit was to get this refilled.   Saw psychiatry in distant past and is willing to see another for better medication management.  No suicidal ideation   ALCOHOL  Drinks at least 6 beers per day.  Knows he should stop.  Would like to cut down  CARDIAC  Sees Dr Juan [name].  Has appointment for labs and echo in the next week  UMBILICAL HERNIA For years.  Doesn't really bother him but they are worried could get worse   OBJECTIVE:   BP 131/80   Pulse 91   Ht 5\' 8"  (1.727 m)   Wt 208 lb 6.4 oz (94.5 kg)   SpO2 98%   BMI 31.69 kg/m   Dishelved male using our O2 as he became hypoxic because did not bring his own O2 Midline abdomen hernia size of large grape fruit with mild surface excoriations nontender Mild edema both lower legs Psych:  Cognition and judgment appear intact. Alert, communicative  and cooperative with normal attention span and concentration. No apparent delusions, illusions, hallucinations    ASSESSMENT/PLAN:   Bronchiectasis without acute exacerbation (HCC) Severe pulmonary disease.  Continue current medications and refer to pulmonary and to pharmacy for smoking cessation  Adjustment disorder with depressed mood Severe.  Given comorbidities will not increase his Klonopin.  Gave list of psychiatrists to contact   Tobacco  abuse Severe.  Would like to see Dr for smoking cessation.  Would likely need psychiatry ok to use Chantix   Umbilical hernia Large.  Less likely to incarcerate given size.  Did discuss warning signs - pain, hardness, persistent nausea and vomiting.  Not currently a surgical candidate given all other problems   Alcohol use disorder, severe, dependence (HCC) Severe.   He relates he will try to slowly cut down.      Discussed with he and wife that I would be willing to continue to prescribe current medications is he comes in for a visit every 6 months.  If he would like to work on his other problems I would be happy to see him more frequently at least monthly for a start.  They will discuss   Will review ordered labs that he will get next week   Juan Band, MD [facility]

## 2019-09-09 NOTE — Assessment & Plan Note (Signed)
Severe.  Given comorbidities will not increase his Klonopin.  Gave list of psychiatrists to contact

## 2019-09-09 NOTE — Assessment & Plan Note (Signed)
Severe.  Would like to see Dr Raymondo Band for smoking cessation.  Would likely need psychiatry ok to use Chantix

## 2019-09-09 NOTE — Assessment & Plan Note (Signed)
Severe pulmonary disease.  Continue current medications and refer to pulmonary and to pharmacy for smoking cessation

## 2019-09-09 NOTE — Assessment & Plan Note (Signed)
Severe.   He relates he will try to slowly cut down.

## 2019-09-10 ENCOUNTER — Other Ambulatory Visit (HOSPITAL_COMMUNITY): Payer: Medicare HMO

## 2019-09-15 ENCOUNTER — Encounter: Payer: Self-pay | Admitting: Family Medicine

## 2019-09-16 ENCOUNTER — Other Ambulatory Visit (HOSPITAL_COMMUNITY): Payer: Medicare HMO

## 2019-09-16 ENCOUNTER — Telehealth: Payer: Self-pay | Admitting: Family Medicine

## 2019-09-16 NOTE — Telephone Encounter (Signed)
Talked with wife He is not in pain or vomiting but taking in only some liquid - beer and a little water  Suggested that if any worsening as above or markedly elevated blood pressure or low blood pressure or not better in 24-48 hours that they should call the ambulance

## 2019-09-16 NOTE — Telephone Encounter (Signed)
Patient's wife calls nurse line regarding patient's condition. Wife reports the following blood pressures: - 175/99, 181/105, 160/107, 170/113, 185/98  Currently, patient's only symptoms are nausea and intermittent lower abdominal pain. Reports one episode of diarrhea yesterday. Denies current severe headache, chest pain, arm pain, SHOB or visual changes.   Offered to schedule appointment tomorrow morning. Wife states that patient is unable to come to office due to oxygen requirements and would need an ambulance transfer. Requesting nurse to come out to draw labs. Advised that home health is typically needed in order to draw labs from the home.   Strict ED precautions given.   Please return call to wife at [phone].   Veronda Prude, RN

## 2019-09-19 ENCOUNTER — Encounter: Payer: Self-pay | Admitting: Family Medicine

## 2019-09-20 ENCOUNTER — Encounter: Payer: Self-pay | Admitting: Family Medicine

## 2019-09-21 ENCOUNTER — Telehealth: Payer: Self-pay

## 2019-09-21 NOTE — Telephone Encounter (Signed)
Patients wife calls nurse line reporting problems with recent klonopin rx. Wife states they picked up prescription when prescribed earlier this month, however it was only for #30. Wife states he stakes 2 per day, therefore only a 15 day supply. He will be out in 2 days. Wife is wanting to go back to original #60 tabs. Please advise.

## 2019-09-21 NOTE — Addendum Note (Signed)
Addended by: Pearlean Brownie L on: 09/21/2019 02:12 PM   Modules accepted: Orders

## 2019-09-22 ENCOUNTER — Other Ambulatory Visit: Payer: Self-pay | Admitting: Family Medicine

## 2019-09-22 ENCOUNTER — Telehealth: Payer: Self-pay

## 2019-09-22 ENCOUNTER — Encounter: Payer: Self-pay | Admitting: Family Medicine

## 2019-09-22 DIAGNOSIS — I10 Essential (primary) hypertension: Secondary | ICD-10-CM

## 2019-09-22 DIAGNOSIS — R69 Illness, unspecified: Secondary | ICD-10-CM | POA: Diagnosis not present

## 2019-09-22 MED ORDER — CLONAZEPAM 0.5 MG PO TABS: 0.5000 mg | ORAL_TABLET | Freq: Two times a day (BID) | ORAL | 4 refills | Status: DC

## 2019-09-22 NOTE — Telephone Encounter (Signed)
Patients wife calls nurse line stating she would feel better if Juan [name] came in for lab work. Juan [name] reports she has been going back and forth with PCP about obtaining labs this week. However, Juan [name] is not wanting to come in. Juan [name] would like the orders in place for "when" he changes his mind. I advised Juan [name] to call us before they come so I can place him on lab schedule. Please place future orders.

## 2019-09-22 NOTE — Telephone Encounter (Signed)
Future labs placed. 

## 2019-09-23 ENCOUNTER — Telehealth: Payer: Self-pay | Admitting: *Deleted

## 2019-09-23 DIAGNOSIS — R69 Illness, unspecified: Secondary | ICD-10-CM | POA: Diagnosis not present

## 2019-09-23 NOTE — Chronic Care Management (AMB) (Signed)
   Care Management   Outreach Note  09/23/2019 Name: BAO BAZEN MRN: [id number] DOB: 01-Mar-1950  RYDELL WIEGEL is a 69 y.o. year old male who is a primary care patient of Chambliss, Estill Batten, MD. I reached out to [name] by phone today in response to a referral sent by Mr. Micharl Helmes Broome's PCP, Carney Living, MD.  An unsuccessful telephone outreach was attempted today. The patient was referred to the case management team for assistance with care management and care coordination.   Follow Up Plan: A HIPPA compliant phone message was left for the patient providing contact information and requesting a return call. The care management team will reach out to the patient again over the next 7 days. If patient returns call to provider office, please advise to call Embedded Care Management Care Guide Gwenevere Ghazi at [phone].  Gwenevere Ghazi  Care Guide, Embedded Care Coordination [facility]  [place], [place] [address] Direct Dial: [phone] [email or web address] Website: [email or web address]

## 2019-09-23 NOTE — Chronic Care Management (AMB) (Signed)
  Care Management   Note  09/23/2019 Name: Juan [name] MRN: [id number] DOB: November 23, 1950  Juan [name] is a 69 y.o. year old male who is a primary care patient of Chambliss, Estill Batten, MD. I reached out to [name] by phone today in response to a referral sent by [name] [facility] health plan.    [name] was given information about care management services today including:  1. Care management services include personalized support from designated clinical staff supervised by his physician, including individualized plan of care and coordination with other care providers 2. 24/7 contact phone numbers for assistance for urgent and routine care needs. 3. The patient may stop care management services at any time by phone call to the office staff.  Patient agreed to services and verbal consent obtained.   Follow up plan: Telephone appointment with care management team member scheduled for:09/24/2019  [facility] Guide, Embedded Care Coordination [facility]  [place], [place] [address] Direct Dial: [phone] [email or web address] Website: [email or web address]

## 2019-09-24 ENCOUNTER — Ambulatory Visit: Payer: Medicare HMO

## 2019-09-24 ENCOUNTER — Other Ambulatory Visit: Payer: Self-pay

## 2019-09-25 NOTE — Chronic Care Management (AMB) (Signed)
Care Management   Initial Visit Note  09/25/2019 Name: Juan [name] MRN: [id number] DOB: 12-09-1950   Assessment: Juan [name] is a 69 y.o. year old male who sees Chambliss, Estill Batten, MD for primary care. The care management team was consulted for assistance with care management and care coordination needs related to Care Coordination Chronic systolic heart failure Atrial fibrillation with RVR  Paroxysmal atrial fibrillation, Bronchiectasis without acute exacerbation.   Review of patient status, including review of consultants reports, relevant laboratory and other test results, and collaboration with appropriate care team members and the patient's provider was performed as part of comprehensive patient evaluation and provision of care management services.    SDOH (Social Determinants of Health) assessments performed: No See Care Plan activities for detailed interventions related to [facility])     Outpatient Encounter Medications as of 09/24/2019  Medication Sig  . albuterol (PROVENTIL) (2.5 MG/3ML) 0.083% nebulizer solution INHALE 1 VIAL VIA NEBULIZER EVERY 6 HOURS AS NEEDED FOR WHEEZING OR SHORTNESS OF BREATH  . aspirin 81 MG chewable tablet Chew 1 tablet (81 mg total) by mouth daily.  . budesonide (PULMICORT) 0.5 MG/2ML nebulizer solution INHALE 1 VIAL VIA NEBULIZER TWICE A DAY (PA REQ)  . cholecalciferol (VITAMIN D) 1000 units tablet TAKE 1 TABLET BY MOUTH EVERY DAY  . clonazePAM (KLONOPIN) 0.5 MG tablet Take 1 tablet (0.5 mg total) by mouth 2 (two) times daily.  Marland Kitchen EPINEPHrine Base (PRIMATENE MIST IN) Inhale 1 puff into the lungs as needed (for breathing).  . furosemide (LASIX) 20 MG tablet Take 1 tablet (20 mg total) by mouth daily. PLEASE COME IN FOR A VISIT  . furosemide (LASIX) 20 MG tablet Take 1 tablet (20 mg total) by mouth daily.  Marland Kitchen gabapentin (NEURONTIN) 300 MG capsule TAKE 1 CAPSULE BY MOUTH THREE TIMES A DAY.  Please make an appointment for inperson visit  . ipratropium  (ATROVENT) 0.02 % nebulizer solution Take 2.5 mLs (0.5 mg total) by nebulization every 6 (six) hours as needed for wheezing or shortness of breath. Please dispense QS x 1 month  . Ipratropium-Albuterol (COMBIVENT RESPIMAT) 20-100 MCG/ACT AERS respimat 1-2 puffs every 4 hours as needd  . losartan (COZAAR) 50 MG tablet Take 1 tablet (50 mg total) by mouth daily. PLEASE COME FOR OFFICE VISIT  . metoprolol succinate (TOPROL-XL) 25 MG 24 hr tablet Take 1.5 tablets (37.5 mg total) by mouth daily. Please keep upcoming appt for refills. Thank you  . Multiple Vitamin (MULTIVITAMIN WITH MINERALS) TABS tablet Take 1 tablet by mouth daily as needed (for vitamin).   . phenylephrine (4-WAY FAST ACTING) 1 % nasal spray Place 1 drop into both nostrils every 6 (six) hours as needed for congestion (4 way nasal spray).  . Pseudoeph-Doxylamine-DM-APAP (NYQUIL PO) as needed.  . sodium chloride (OCEAN) 0.65 % SOLN nasal spray Place 1 spray into both nostrils as needed for congestion.  Marland Kitchen venlafaxine XR (EFFEXOR-XR) 150 MG 24 hr capsule TAKE 1 CAPSULE (150 MG TOTAL) BY MOUTH DAILY WITH BREAKFAST.  . [DISCONTINUED] COMBIVENT RESPIMAT 20-100 MCG/ACT AERS respimat INHALE 1 TO 2 PUFFS BY MOUTH EVERY 4 HOURS AS NEEDED FOR WHEEZE PLEASE PROVIDE 2 INHALERS   No facility-administered encounter medications on file as of 09/24/2019.    Goals Addressed              This Visit's Progress   .  I would like potable oxygen (pt-stated)        CARE PLAN ENTRY (see longitudinal plan of  care for additional care plan information)  Current Barriers:  . Care Coordination needs related to portable oxygen in a patient with Chronic systolic heart failure, Atrial fibrillation with RVR, Paroxysmal atrial fibrillation, Bronchiectasis without acute exacerbation.  Nurse Case Manager Clinical Goal(s):  Marland Kitchen Over the next 30 days, patient will verbalize understanding of plan for portable oxygen  Interventions:  . Inter-disciplinary care team  collaboration (see longitudinal plan of care) . Evaluation of current treatment plan related to portable oxygen and patient's adherence to plan as established by provider. . Advised patient to go to his appointment on Monday for echo and use his 3 ft. etank in carrier for now until he can have a test for a new portable tank to see if he will qualify. Marland Kitchen Collaborated with [employer]  regarding information test needed for smaller portable tanks.  The patient will need to have an order for a Best fit Eval. . Discussed plans with patient for ongoing care management follow up and provided patient with direct contact information for care management team . Reviewed scheduled/upcoming provider appointments including: Monday for Echo . Called an spoke with Dr Deirdre Priest at the patient's request to see if it is best for him to keep the appointment due to the rise in covid.  Dr Deirdre Priest Advised the patient to keep the appointment because it is important. He needs to follow all covid precautions and precautions will be followed when he arrives at the facility.  Patient Self Care Activities:  . Patient verbalizes understanding of plan for portable oxygen . Self administers medications as prescribed . Attends all scheduled provider appointments . Calls provider office for new concerns or questions . Unable to independently self navigate DME for portable oxygen  Initial goal documentation         Follow up plan:  The care management team will reach out to the patient again over the next 14 days.   Juan [name] was given information about Care Management services today including:  1. Care Management services include personalized support from designated clinical staff supervised by a physician, including individualized plan of care and coordination with other care providers 2. 24/7 contact phone numbers for assistance for urgent and routine care needs. 3. The patient may stop Care Management services at any  time (effective at the end of the month) by phone call to the office staff.  Patient agreed to services and verbal consent obtained.  [name] RN, BSN, [facility] Care Management Coordinator [facility] Phone: [phone] Fax: [phone]  .

## 2019-09-25 NOTE — Patient Instructions (Signed)
Visit Information  Goals Addressed              This Visit's Progress     I would like potable oxygen (pt-stated)        CARE PLAN ENTRY (see longitudinal plan of care for additional care plan information)  Current Barriers:   Care Coordination needs related to portable oxygen in a patient with Chronic systolic heart failure, Atrial fibrillation with RVR, Paroxysmal atrial fibrillation, Bronchiectasis without acute exacerbation.  Nurse Case Manager Clinical Goal(s):   Over the next 30 days, patient will verbalize understanding of plan for portable oxygen  Interventions:   Inter-disciplinary care team collaboration (see longitudinal plan of care)  Evaluation of current treatment plan related to portable oxygen and patient's adherence to plan as established by provider.  Advised patient to go to his appointment on Monday for echo and use his 3 ft. etank in carrier for now until he can have a test for a new portable tank to see if he will qualify.  Collaborated with [employer]  regarding information test needed for smaller portable tanks.  The patient will need to have an order for a Best fit Eval.  Discussed plans with patient for ongoing care management follow up and provided patient with direct contact information for care management team  Reviewed scheduled/upcoming provider appointments including: Monday for Echo  Called an spoke with Dr Deirdre Priest at the patient's request to see if it is best for him to keep the appointment due to the rise in covid.  Dr Deirdre Priest Advised the patient to keep the appointment because it is important. He needs to follow all covid precautions and precautions will be followed when he arrives at the facility.  Patient Self Care Activities:   Patient verbalizes understanding of plan for portable oxygen  Self administers medications as prescribed  Attends all scheduled provider appointments  Calls provider office for new concerns or  questions  Unable to independently self navigate DME for portable oxygen  Initial goal documentation        Juan [name] was given information about Care Management services today including:  1. Care Management services include personalized support from designated clinical staff supervised by his physician, including individualized plan of care and coordination with other care providers 2. 24/7 contact phone numbers for assistance for urgent and routine care needs. 3. The patient may stop CCM services at any time (effective at the end of the month) by phone call to the office staff.  Patient agreed to services and verbal consent obtained.   The patient verbalized understanding of instructions provided today and declined a print copy of patient instruction materials.   The care management team will reach out to the patient again over the next 14 days.   [name] RN, BSN, [facility] Care Management Coordinator [facility] Phone: [phone] Fax: [phone]

## 2019-09-27 ENCOUNTER — Encounter (HOSPITAL_COMMUNITY): Payer: Self-pay

## 2019-09-27 ENCOUNTER — Other Ambulatory Visit: Payer: Self-pay | Admitting: Family Medicine

## 2019-09-27 ENCOUNTER — Telehealth (HOSPITAL_COMMUNITY): Payer: Self-pay | Admitting: Internal Medicine

## 2019-09-27 ENCOUNTER — Telehealth: Payer: Medicare HMO

## 2019-09-27 ENCOUNTER — Telehealth: Payer: Self-pay | Admitting: Internal Medicine

## 2019-09-27 ENCOUNTER — Ambulatory Visit (HOSPITAL_COMMUNITY): Payer: Medicare HMO | Attending: Cardiology

## 2019-09-27 DIAGNOSIS — I426 Alcoholic cardiomyopathy: Secondary | ICD-10-CM

## 2019-09-27 DIAGNOSIS — I428 Other cardiomyopathies: Secondary | ICD-10-CM

## 2019-09-27 NOTE — Telephone Encounter (Signed)
Thanks .. echo notified me already they were unable to reach him and that he missed the appt.  Dr Rexene Edison

## 2019-09-27 NOTE — Telephone Encounter (Signed)
Just an FYI. We have made several attempts to contact this patient including sending a letter to schedule or reschedule their echocardiogram. We will be removing the patient from the echo WQ.   07/28/19 pt called me and does not want at this time, wants to see PCP/LBW  07/26/19 MAILED LETTER  07/26/2019 [facility] to reschedule @ 1:57/LBW  07/21/19 [facility] to schedule @ 11:44/LBW  07/14/2019 [facility] to schedule @ 1:35/LBW   Thank you

## 2019-09-27 NOTE — Telephone Encounter (Signed)
Spoke with pt's wife and pt had a flare up of COPD yesterday and did not rest well had a lot of congestion. Pt did not make it to echo appt this am.  Per wife has been suffering from fatigue and nausea off and on for couple of weeks . Will forward to Dr Ladon Applebaum .Pt will call back in a couple of days and reschedule echo ./cy

## 2019-09-27 NOTE — Addendum Note (Signed)
Addended by: Scherrie Bateman E on: 09/27/2019 10:34 AM   Modules accepted: Orders

## 2019-09-27 NOTE — Telephone Encounter (Signed)
Juan [name] is calling stating Arnell missed his Echo this morning due to being up all night with breathing issues from his COPD. She states he is breathing fine now and she will be taking him to a Doctor as soon as she can.

## 2019-09-28 ENCOUNTER — Other Ambulatory Visit: Payer: Medicare HMO

## 2019-09-28 ENCOUNTER — Other Ambulatory Visit: Payer: Self-pay

## 2019-09-28 DIAGNOSIS — I10 Essential (primary) hypertension: Secondary | ICD-10-CM

## 2019-09-29 ENCOUNTER — Telehealth: Payer: Self-pay | Admitting: Family Medicine

## 2019-09-29 ENCOUNTER — Ambulatory Visit: Payer: Medicare HMO

## 2019-09-29 LAB — CMP14+EGFR
ALT: 30 IU/L (ref 0–44)
AST: 25 IU/L (ref 0–40)
Albumin/Globulin Ratio: 1.6 (ref 1.2–2.2)
Albumin: 4.4 g/dL (ref 3.8–4.8)
Alkaline Phosphatase: 91 IU/L (ref 48–121)
BUN/Creatinine Ratio: 9 — ABNORMAL LOW (ref 10–24)
BUN: 7 mg/dL — ABNORMAL LOW (ref 8–27)
Bilirubin Total: 0.7 mg/dL (ref 0.0–1.2)
CO2: 21 mmol/L (ref 20–29)
Calcium: 9.3 mg/dL (ref 8.6–10.2)
Chloride: 89 mmol/L — ABNORMAL LOW (ref 96–106)
Creatinine, Ser: 0.8 mg/dL (ref 0.76–1.27)
GFR calc Af Amer: 107 mL/min/{1.73_m2} (ref >59)
GFR calc non Af Amer: 92 mL/min/{1.73_m2} (ref >59)
Globulin, Total: 2.7 g/dL (ref 1.5–4.5)
Glucose: 130 mg/dL — ABNORMAL HIGH (ref 65–99)
Potassium: 4.9 mmol/L (ref 3.5–5.2)
Sodium: 126 mmol/L — ABNORMAL LOW (ref 134–144)
Total Protein: 7.1 g/dL (ref 6.0–8.5)

## 2019-09-29 LAB — CBC
Hematocrit: 45.4 % (ref 37.5–51.0)
Hemoglobin: 16.4 g/dL (ref 13.0–17.7)
MCH: 30.7 pg (ref 26.6–33.0)
MCHC: 36.1 g/dL — ABNORMAL HIGH (ref 31.5–35.7)
MCV: 85 fL (ref 79–97)
Platelets: 296 10*3/uL (ref 150–450)
RBC: 5.35 x10E6/uL (ref 4.14–5.80)
RDW: 12.4 % (ref 11.6–15.4)
WBC: 11.7 10*3/uL — ABNORMAL HIGH (ref 3.4–10.8)

## 2019-09-29 NOTE — Telephone Encounter (Signed)
I am covering for Dr. Deirdre Priest who is away from the office.  Received patient's labs, significant for sodium of 126, lowest for him since 2017 (though admittedly no labs since Oct 2019). History of chronic hyponatremia in the past, but values when most recently checked in 2019 tended to be over 130.  Patient with history of alcohol use disorder, drinks 6+ beers per day and also other fluids. 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  Called and spoke with patient and wife. They report he is doing somewhat better than a few weeks ago but still has nausea. Abdominal pain resolved. Not currently nauseated but it has kept him from eating much. Has decreased appetite. Is very anxious. Eats maybe a sandwich but not much else. Not sleeping well. Had echo appointment scheduled that he could not go to multiple times, due to feeling so poorly. Is due for his second COVID vaccine today but wife is unsure if he should get this. Advised it may transiently make him feel worse in the next 24 or so hours so it may be best to hold off until next week.  After discussion, plan is the following: - attempt to increase solute intake (even small bites frequently would help) - attempt to decrease fluid intake some (though not rapidly, especially not with ETOH dependence) - follow up in clinic on Monday for recheck of labs with Dr. Obie Dredge, visit scheduled (can decide on whether to administer second COVID vaccine at that time) - next available appointment scheduled with PCP for late September. Given burden of chronic illness and symptoms, would consider discussing goals of care/palliative medicine consult at that time.  FYI to Drs. Meccariello and Chambliss.  Latrelle Dodrill, MD

## 2019-09-30 ENCOUNTER — Telehealth (HOSPITAL_COMMUNITY): Payer: Self-pay | Admitting: Internal Medicine

## 2019-09-30 NOTE — Telephone Encounter (Signed)
Just an FYI. We have made several attempts to contact this patient including sending a letter to schedule or reschedule their echocardiogram. We will be removing the patient from the echo WQ.    Pt no showed 09/27/2019 Cancelled: 06/1, 08/06 and 09/16/2019    Thank you

## 2019-10-04 ENCOUNTER — Ambulatory Visit: Payer: Medicare HMO | Admitting: Family Medicine

## 2019-10-04 ENCOUNTER — Telehealth: Payer: Self-pay

## 2019-10-04 NOTE — Telephone Encounter (Signed)
Patients wife calls nurse line reporting weakness and "upset stomach" in patient. Patients wife reports she does not think she will be able to get him here to scheduled 1050 apt with Meccariello. Patients wife requesting to switch to a virtual visit. Per Meccariello and [place] he needs to be seen for lab work and evaluation. Wife reports she we will try to get him here, however unsure. I advised her if he is too weak to come in she will need to call EMS for evaluation. Patients wife verbalized understanding.

## 2019-10-04 NOTE — Progress Notes (Deleted)
    SUBJECTIVE:   CHIEF COMPLAINT / HPI:   Hyponatremia and Weakness ***  PERTINENT  PMH / PSH: ***  OBJECTIVE:   There were no vitals taken for this visit.  ***  ASSESSMENT/PLAN:   No problem-specific Assessment & Plan notes found for this encounter.     Unknown Jim, DO [facility]

## 2019-10-05 ENCOUNTER — Ambulatory Visit: Payer: Medicare HMO | Admitting: Family Medicine

## 2019-10-06 ENCOUNTER — Ambulatory Visit: Payer: Medicare HMO | Admitting: Family Medicine

## 2019-10-06 ENCOUNTER — Other Ambulatory Visit: Payer: Self-pay

## 2019-10-06 ENCOUNTER — Telehealth: Payer: Self-pay | Admitting: Family Medicine

## 2019-10-06 VITALS — BP 122/64 | HR 84 | Wt 204.0 lb

## 2019-10-06 DIAGNOSIS — E871 Hypo-osmolality and hyponatremia: Secondary | ICD-10-CM

## 2019-10-06 NOTE — Assessment & Plan Note (Addendum)
Doing well. No symptoms today. Na level was 126 on 09/28/2019. -BMP today pending -Consider venlafaxine as a culprit to reducing sodium -Address alcohol use in subsequent visit -Has f/u w/ PCP 9/29; discussed following up sooner if needed

## 2019-10-06 NOTE — Progress Notes (Signed)
    SUBJECTIVE:   CHIEF COMPLAINT / HPI:   Hyponatremia Here to follow-up on last lab that had a low sodium.  He is feeling better today.  Denies nausea, fatigue, headache, confusion, and low urine output.  Of note, was experiencing stomach pain that resolved last night. Thought he had a stomach bug he is feeling better today and has an appetite.  Has not eaten this morning both like to.  PERTINENT  PMH / PSH: HFrEF (EF 30-35% 2019), PAF, Alcohol use disorder  OBJECTIVE:   BP 122/64   Pulse 84   Wt 204 lb (92.5 kg)   BMI 31.02 kg/m   General: Appears well, no acute distress. Age appropriate. Cardiac: RRR, normal heart sounds, no murmurs Respiratory: CTAB, normal effort Abdomen: soft, nontender, nondistended  ASSESSMENT/PLAN:   Chronic hyponatremia Doing well. No symptoms today. -BMP today pending -Consider venlafaxine as a culprit to reducing sodium -Address alcohol use in subsequent visit -Has f/u w/ PCP 9/29; discussed following up sooner if needed   Lavonda Jumbo, DO [facility]

## 2019-10-06 NOTE — Telephone Encounter (Signed)
Called patient. Spoke with wife Mrs. Juan [name]. Gave number to call [facility] [phone]) to make an appointment as referral was sent and their office may be backed up with calling patient to schedule. She voice appreciation.   Lavonda Jumbo, DO 10/06/2019, 6:45 PM PGY-2,  Family Medicine

## 2019-10-06 NOTE — Patient Instructions (Signed)
It was very nice to meet you today. Please enjoy the rest of your week.  I am glad your stomach pain has resolved and you have an appetite today.   Today you were seen for repeat blood work to check your sodium.   If it is abnormal I will give you a call. If normal we can communicate via Mychart.   Remember to talk with Dr. Deirdre Priest of whether to get the second dose of the vaccine.   Follow up in 4 weeks for follow up with Dr. Deirdre Priest or sooner if needed.   Please call the clinic at [phone] if your symptoms worsen or you have any concerns. It was our pleasure to serve you.  Dr. Salvadore Dom

## 2019-10-07 ENCOUNTER — Telehealth: Payer: Self-pay | Admitting: Family Medicine

## 2019-10-07 ENCOUNTER — Telehealth: Payer: Self-pay | Admitting: Internal Medicine

## 2019-10-07 DIAGNOSIS — I5022 Chronic systolic (congestive) heart failure: Secondary | ICD-10-CM

## 2019-10-07 DIAGNOSIS — I426 Alcoholic cardiomyopathy: Secondary | ICD-10-CM

## 2019-10-07 DIAGNOSIS — E871 Hypo-osmolality and hyponatremia: Secondary | ICD-10-CM

## 2019-10-07 DIAGNOSIS — I10 Essential (primary) hypertension: Secondary | ICD-10-CM

## 2019-10-07 LAB — SPECIMEN STATUS

## 2019-10-07 NOTE — Telephone Encounter (Signed)
New Message:     Wife called to schedule an Echo, I did not see an order. Does pt need an Echo?

## 2019-10-07 NOTE — Telephone Encounter (Signed)
Called patient. LVM to call back to talk about possible medication changes such as effexor. This can lower sodium and possibly decreasing could normalize sodium.   Lavonda Jumbo, DO 10/07/2019, 6:25 PM PGY-2, [facility]

## 2019-10-07 NOTE — Telephone Encounter (Signed)
Called and scheduled next available echo appointment for 9/13. Patient currently has a phone appointment with Dr. [name] on 9/11 and did not see any availability after echo. They are asking if labs are needed. It appears that the patient was suppose to have CBC, CMET, Mg, BNP, and LIPIDS. Patient has had recent CBC and BMET. It appears that the patient is hyponatremic but has not been advised yet. Will forward to Dr. [name] and his RN for review to see if phone appointment should be pushed until after echo and if and what labs are needed.

## 2019-10-08 LAB — BASIC METABOLIC PANEL
BUN/Creatinine Ratio: 10 (ref 10–24)
BUN: 7 mg/dL — ABNORMAL LOW (ref 8–27)
CO2: 23 mmol/L (ref 20–29)
Calcium: 9.1 mg/dL (ref 8.6–10.2)
Chloride: 88 mmol/L — ABNORMAL LOW (ref 96–106)
Creatinine, Ser: 0.73 mg/dL — ABNORMAL LOW (ref 0.76–1.27)
GFR calc Af Amer: 111 mL/min/{1.73_m2} (ref >59)
GFR calc non Af Amer: 96 mL/min/{1.73_m2} (ref >59)
Glucose: 128 mg/dL — ABNORMAL HIGH (ref 65–99)
Potassium: 4.9 mmol/L (ref 3.5–5.2)
Sodium: 126 mmol/L — ABNORMAL LOW (ref 134–144)

## 2019-10-08 NOTE — Telephone Encounter (Signed)
Would like to see him after the echo - maybe we can reschedule.  Need labs as well - recheck sodium.  Dr Rexene Edison

## 2019-10-08 NOTE — Telephone Encounter (Signed)
Left message for Juan [name] to call

## 2019-10-08 NOTE — Telephone Encounter (Signed)
Patients wife returns call to nurse line. [name] report she can call her after 3pm if you are available.  [phone] # [phone]

## 2019-10-12 ENCOUNTER — Telehealth: Payer: Self-pay | Admitting: Internal Medicine

## 2019-10-12 ENCOUNTER — Ambulatory Visit: Payer: Medicare HMO

## 2019-10-12 NOTE — Patient Instructions (Signed)
Visit Information  Goals Addressed              This Visit's Progress   .  I would like potable oxygen (pt-stated)        CARE PLAN ENTRY (see longitudinal plan of care for additional care plan information)  Current Barriers:  . Care Coordination needs related to portable oxygen in a patient with Chronic systolic heart failure, Atrial fibrillation with RVR, Paroxysmal atrial fibrillation, Bronchiectasis without acute exacerbation.  Nurse Case Manager Clinical Goal(s):  Marland Kitchen Over the next 30 days, patient will verbalize understanding of plan for portable oxygen  Interventions:  . Inter-disciplinary care team collaboration (see longitudinal plan of care) . Evaluation of current treatment plan related to portable oxygen and patient's adherence to plan as established by provider. . Advised patient to go to his appointment on Monday for echo and use his 3 ft. etank in carrier for now until he can have a test for a new portable tank to see if he will qualify. Marland Kitchen Collaborated with [employer]  regarding information test needed for smaller portable tanks.  The patient will need to have an order for a Best fit Eval. . Discussed plans with patient for ongoing care management follow up and provided patient with direct contact information for care management team . Spoke with spouse today and she states that her husband still does not feel well. He is eating just sandwiches.  I encouraged her to try nutritional drinks like Boost or Glucerna if he can hold them down or likes them for nutritional purposes. . She said that the doctor had tried to call her last week to give her information about his  labs but she did not get a chance to speak with her.  I advised her to call back today . She stated that her husband did not go for his echo and is rescheduled for 10/18/19. . I advised her that she can revisit the oxygen at a later time when her husband feels better.  Patient Self Care Activities:  . Patient  verbalizes understanding of plan for portable oxygen . Self administers medications as prescribed . Attends all scheduled provider appointments . Calls provider office for new concerns or questions . Unable to independently self navigate DME for portable oxygen  Please see past updates related to this goal by clicking on the "Past Updates" button in the selected goal         Juan [name] was given information about Care Management services today including:  1. Care Management services include personalized support from designated clinical staff supervised by his physician, including individualized plan of care and coordination with other care providers 2. 24/7 contact phone numbers for assistance for urgent and routine care needs. 3. The patient may stop CCM services at any time (effective at the end of the month) by phone call to the office staff.  Patient agreed to services and verbal consent obtained.   The patient verbalized understanding of instructions provided today and declined a print copy of patient instruction materials.   The care management team will reach out to the patient again over the next 10 days.   [name] RN, BSN, [facility] Care Management Coordinator [facility] Phone: [phone] Fax: [phone]

## 2019-10-12 NOTE — Chronic Care Management (AMB) (Signed)
Care Management   Follow Up Note   10/12/2019 Name: Juan [name] MRN: [id number] DOB: 07-May-1950  Referred by: Carney Living, MD Reason for referral : Chronic Care Management (Oxygen follow up)   LION FERNANDEZ is a 69 y.o. year old male who is a primary care patient of Chambliss, Estill Batten, MD. The care management team was consulted for assistance with care management and care coordination needs.    Review of patient status, including review of consultants reports, relevant laboratory and other test results, and collaboration with appropriate care team members and the patient's provider was performed as part of comprehensive patient evaluation and provision of chronic care management services.    SDOH (Social Determinants of Health) assessments performed: No See Care Plan activities for detailed interventions related to [facility])     Advanced Directives: See Care Plan and Vynca application for related entries.   Goals Addressed              This Visit's Progress   .  I would like potable oxygen (pt-stated)        CARE PLAN ENTRY (see longitudinal plan of care for additional care plan information)  Current Barriers:  . Care Coordination needs related to portable oxygen in a patient with Chronic systolic heart failure, Atrial fibrillation with RVR, Paroxysmal atrial fibrillation, Bronchiectasis without acute exacerbation.  Nurse Case Manager Clinical Goal(s):  Marland Kitchen Over the next 30 days, patient will verbalize understanding of plan for portable oxygen  Interventions:  . Inter-disciplinary care team collaboration (see longitudinal plan of care) . Evaluation of current treatment plan related to portable oxygen and patient's adherence to plan as established by provider. . Advised patient to go to his appointment on Monday for echo and use his 3 ft. etank in carrier for now until he can have a test for a new portable tank to see if he will qualify. Marland Kitchen Collaborated with  [employer]  regarding information test needed for smaller portable tanks.  The patient will need to have an order for a Best fit Eval. . Discussed plans with patient for ongoing care management follow up and provided patient with direct contact information for care management team . Spoke with spouse today and she states that her husband still does not feel well. He is eating just sandwiches.  I encouraged her to try nutritional drinks like Boost or Glucerna if he can hold them down or likes them for nutritional purposes. . She said that the doctor had tried to call her last week to give her information about his  labs but she did not get a chance to speak with her.  I advised her to call back today . She stated that her husband did not go for his echo and is rescheduled for 10/18/19. . I advised her that she can revisit the oxygen at a later time when her husband feels better.  Patient Self Care Activities:  . Patient verbalizes understanding of plan for portable oxygen . Self administers medications as prescribed . Attends all scheduled provider appointments . Calls provider office for new concerns or questions . Unable to independently self navigate DME for portable oxygen  Please see past updates related to this goal by clicking on the "Past Updates" button in the selected goal          The care management team will reach out to the patient again over the next 10 days.   [name] RN, BSN, [facility] Care Management Coordinator [facility] Phone: [phone] Fax: [phone]

## 2019-10-13 NOTE — Telephone Encounter (Signed)
Called wife and patient back and discussed no medication changes at this time per Dr. Deirdre Priest. Will need to discuss other chronic issue such as alcohol abuse. Follow up with PCP 9/29.  Lavonda Jumbo, DO 10/13/2019, 5:14 PM PGY-2, [facility]

## 2019-10-13 NOTE — Telephone Encounter (Signed)
[name] aware of recommendations and will have pt come fasting on 9/13 for labs and rechecking the NA ./cy

## 2019-10-14 NOTE — Telephone Encounter (Signed)
Lab appt scheduled and lab orders linked

## 2019-10-15 ENCOUNTER — Telehealth: Payer: Medicare HMO | Admitting: Internal Medicine

## 2019-10-17 ENCOUNTER — Encounter: Payer: Self-pay | Admitting: Family Medicine

## 2019-10-18 ENCOUNTER — Ambulatory Visit (HOSPITAL_COMMUNITY): Payer: Medicare HMO | Attending: Cardiology

## 2019-10-18 ENCOUNTER — Other Ambulatory Visit: Payer: Self-pay

## 2019-10-18 ENCOUNTER — Other Ambulatory Visit: Payer: Medicare HMO | Admitting: *Deleted

## 2019-10-18 DIAGNOSIS — I5022 Chronic systolic (congestive) heart failure: Secondary | ICD-10-CM | POA: Diagnosis not present

## 2019-10-18 DIAGNOSIS — I10 Essential (primary) hypertension: Secondary | ICD-10-CM | POA: Diagnosis not present

## 2019-10-18 DIAGNOSIS — I426 Alcoholic cardiomyopathy: Secondary | ICD-10-CM | POA: Insufficient documentation

## 2019-10-18 DIAGNOSIS — R69 Illness, unspecified: Secondary | ICD-10-CM | POA: Diagnosis not present

## 2019-10-18 DIAGNOSIS — E871 Hypo-osmolality and hyponatremia: Secondary | ICD-10-CM | POA: Diagnosis not present

## 2019-10-18 LAB — COMPREHENSIVE METABOLIC PANEL
ALT: 27 IU/L (ref 0–44)
AST: 26 IU/L (ref 0–40)
Albumin/Globulin Ratio: 1.7 (ref 1.2–2.2)
Albumin: 4.3 g/dL (ref 3.8–4.8)
Alkaline Phosphatase: 93 IU/L (ref 44–121)
BUN/Creatinine Ratio: 8 — ABNORMAL LOW (ref 10–24)
BUN: 6 mg/dL — ABNORMAL LOW (ref 8–27)
Bilirubin Total: 0.5 mg/dL (ref 0.0–1.2)
CO2: 24 mmol/L (ref 20–29)
Calcium: 9.8 mg/dL (ref 8.6–10.2)
Chloride: 86 mmol/L — ABNORMAL LOW (ref 96–106)
Creatinine, Ser: 0.8 mg/dL (ref 0.76–1.27)
GFR calc Af Amer: 107 mL/min/{1.73_m2} (ref >59)
GFR calc non Af Amer: 92 mL/min/{1.73_m2} (ref >59)
Globulin, Total: 2.5 g/dL (ref 1.5–4.5)
Glucose: 140 mg/dL — ABNORMAL HIGH (ref 65–99)
Potassium: 5.1 mmol/L (ref 3.5–5.2)
Sodium: 124 mmol/L — ABNORMAL LOW (ref 134–144)
Total Protein: 6.8 g/dL (ref 6.0–8.5)

## 2019-10-18 LAB — LIPID PANEL
Chol/HDL Ratio: 4 ratio (ref 0.0–5.0)
Cholesterol, Total: 158 mg/dL (ref 100–199)
HDL: 40 mg/dL (ref >39)
LDL Chol Calc (NIH): 97 mg/dL (ref 0–99)
Triglycerides: 114 mg/dL (ref 0–149)
VLDL Cholesterol Cal: 21 mg/dL (ref 5–40)

## 2019-10-18 LAB — MAGNESIUM: Magnesium: 1.6 mg/dL (ref 1.6–2.3)

## 2019-10-18 LAB — CBC
Hematocrit: 46.5 % (ref 37.5–51.0)
Hemoglobin: 16.3 g/dL (ref 13.0–17.7)
MCH: 30.5 pg (ref 26.6–33.0)
MCHC: 35.1 g/dL (ref 31.5–35.7)
MCV: 87 fL (ref 79–97)
Platelets: 325 10*3/uL (ref 150–450)
RBC: 5.34 x10E6/uL (ref 4.14–5.80)
RDW: 12.1 % (ref 11.6–15.4)
WBC: 11.7 10*3/uL — ABNORMAL HIGH (ref 3.4–10.8)

## 2019-10-18 LAB — PRO B NATRIURETIC PEPTIDE: NT-Pro BNP: 561 pg/mL — ABNORMAL HIGH (ref 0–376)

## 2019-10-18 LAB — ECHOCARDIOGRAM COMPLETE
Area-P 1/2: 5.62 cm2
S' Lateral: 4.5 cm

## 2019-10-18 LAB — SPECIMEN STATUS REPORT

## 2019-10-18 NOTE — Progress Notes (Signed)
Echocardiogram performed,  Lavenia Atlas, RCS

## 2019-10-24 DIAGNOSIS — R69 Illness, unspecified: Secondary | ICD-10-CM | POA: Diagnosis not present

## 2019-10-26 ENCOUNTER — Telehealth: Payer: Self-pay

## 2019-10-26 ENCOUNTER — Telehealth: Payer: Medicare HMO

## 2019-10-26 NOTE — Telephone Encounter (Signed)
  Care Management   Outreach Note  10/26/2019 Name: Juan [name] MRN: [id number] DOB: 1950-09-29  Referred by: Carney Living, MD Reason for referral : Appointment (Follow up)   An unsuccessful telephone outreach was attempted today. The patient was referred to the case management team for assistance with care management and care coordination.   Follow Up Plan: A HIPAA compliant phone message was left for the patient providing contact information and requesting a return call.  The care management team will reach out to the patient again over the next 7-14 days.   [name] RN, BSN, [facility] Care Management Coordinator [facility] Phone: [phone] Fax: [phone]

## 2019-10-27 ENCOUNTER — Telehealth: Payer: Self-pay | Admitting: *Deleted

## 2019-10-27 ENCOUNTER — Ambulatory Visit: Payer: Medicare HMO

## 2019-10-27 NOTE — Chronic Care Management (AMB) (Signed)
  Care Management   Note  10/27/2019 Name: Juan [name] MRN: [id number] DOB: 1950/04/18  Juan [name] is a 69 y.o. year old male who is a primary care patient of Chambliss, Estill Batten, MD and is actively engaged with the care management team. I reached out to [name] by phone today to assist with re-scheduling a follow up visit with the RN Case Manager.  Follow up plan: Unsuccessful telephone outreach attempt made. A HIPAA compliant phone message was left for the patient providing contact information and requesting a return call. The care management team will reach out to the patient again over the next 7 days. If patient returns call to provider office, please advise to call Embedded Care Management Care Guide Gwenevere Ghazi at [phone]  [facility] Guide, Embedded Care Coordination [facility] Management

## 2019-11-01 ENCOUNTER — Other Ambulatory Visit: Payer: Self-pay

## 2019-11-01 ENCOUNTER — Encounter: Payer: Self-pay | Admitting: Internal Medicine

## 2019-11-01 ENCOUNTER — Ambulatory Visit (INDEPENDENT_AMBULATORY_CARE_PROVIDER_SITE_OTHER): Payer: Medicare HMO | Admitting: Internal Medicine

## 2019-11-01 VITALS — BP 148/85 | HR 92 | Ht 68.0 in | Wt 204.8 lb

## 2019-11-01 DIAGNOSIS — R0602 Shortness of breath: Secondary | ICD-10-CM | POA: Diagnosis not present

## 2019-11-01 DIAGNOSIS — I10 Essential (primary) hypertension: Secondary | ICD-10-CM

## 2019-11-01 DIAGNOSIS — R69 Illness, unspecified: Secondary | ICD-10-CM | POA: Diagnosis not present

## 2019-11-01 DIAGNOSIS — I428 Other cardiomyopathies: Secondary | ICD-10-CM

## 2019-11-01 DIAGNOSIS — J441 Chronic obstructive pulmonary disease with (acute) exacerbation: Secondary | ICD-10-CM | POA: Diagnosis not present

## 2019-11-01 DIAGNOSIS — I426 Alcoholic cardiomyopathy: Secondary | ICD-10-CM

## 2019-11-01 MED ORDER — FUROSEMIDE 40 MG PO TABS
40.0000 mg | ORAL_TABLET | Freq: Every day | ORAL | 3 refills | Status: DC
Start: 2019-11-01 — End: 2020-11-07

## 2019-11-01 NOTE — Progress Notes (Signed)
OFFICE NOTE  Chief Complaint:  Follow-up  Primary Care Physician: Lind Covert, MD  HPI:  Juan [name] is a 69 yo male with a history of heavy drinking for the past 15 years he has been drinking 4-5 drinks a day. He presented to [facility] after seizure like activity. Wife noted rhythmic jerking bilaterally for about 30 seconds. He was unresponsive and post ictal afterwards for about 30 min. Did not bite his tongue, no bladder or bowel incontinence. No history of head injury recently. 2 years ago he had a similar episode after abstaining from alcohol and stopping his klonopin suddenly. This time wife thinks he has been taking his medications. Of note his Zoloft has recently been changed to Effexor. He also presented with an aspiration pneumonia and was treated for such. It was felt that he had acute on chronic systolic heart failure exacerbation. An echocardiogram was performed which demonstrated a severe cardiomyopathy with global hypokinesis and EF of 30-35%.  He underwent a nuclear stress test which did not demonstrate any signs of ischemia therefore was thought that he had a nonischemic cardiomyopathy possibly related to alcohol use.   He was diuresed and then placed on appropriate heart failure medications. A life vest was then placed because he had some episodes of nonsustained ventricular tachycardia in a hospital. It was thought this may be due to a metabolic derangement. Since discharge she has been clinically stable. His weight has remained stable and he has had no further signs of heart failure. He denies any shocks with a life vest. He does report that it may be hurting his back somewhat. He has been abstinent from alcohol since his hospitalization. His only other main concern is that he has history of anxiety and felt that he did better on Zoloft and Effexor versus Effexor alone. He wishes to discuss this with his psychiatrist, but is apparently taken off of Zoloft  due to hyponatremia. I suspect this may be due to heart failure, not the medication. If he does feel that he did better on Zoloft it would be reasonable to consider restarting that. I think anything we can do to keep him from drinking would be best for his heart.  [name] returns today for follow-up. Has not seen him in over year and a half. In the interim he's been hospitalized about 5 times. He's had numerous episodes of alcohol abuse and withdrawal. Fortunately, repeat echo in August 2015 showed his EF had improved back to normal. He discontinue the life vest at that time. It's been very difficult to understand if he's compliant with his medications when he is binging on alcohol. After discharge this time he feels that he is still significantly weak and fatigued. Heart rate is noted to be elevated today with a right bundle branch block. He denies any chest pain or worsening shortness of breath. Weight has been very stable if not less than it had been previously. He did have significant diarrhea.  11/01/2019  [name] returns today for follow-up.  He was recently seen via telemedicine visit this past year and had not been seen since 2016.  He continues to struggle with a cardiomyopathy and recent echo showed further decline in LVEF down to 30 to 35%.  Alcohol use is a problem.  Apparently has a history of some withdraw and his wife is hesitant to stop supplying him the alcohol however he cannot drive and has visual difficulty that keeps him from leaving  the house.  He also continues to smoke and is suffering from COPD.  Today reports productive cough and shortness of breath with signs of possible COPD exacerbation.  He does have a follow-up with his primary care provider in 2 days from now.  PMHx:  Past Medical History:  Diagnosis Date  . Alcoholism (HCC)   . Allergy    seasonal   . Anxiety   . Asthma   . CHF (congestive heart failure) (HCC) 10/2010   ECHO:  EF 40%, Grade II diastolic  dysfunction  . Chronic diastolic heart failure (HCC)   . COPD (chronic obstructive pulmonary disease) (HCC)   . COPD exacerbation (HCC)   . Depression   . Headache   . Hyperlipidemia   . Hypertension   . Lung nodule seen on imaging study 06/15/2013   Needs follow-up in 3 months (early 2017)   . RBBB 11/09/2012  . Seizures (HCC)   . Tremor 07/30/2011   Likely secondary to long-term alcohol abuse. Did discuss how his tremors affecting his alcoholism. We also switching her from albuterol to Atrovent in case this is causing any worsening of his tremor.   Marland Kitchen Umbilical hernia 08/06/2012    Past Surgical History:  Procedure Laterality Date  . TOENAIL EXCISION Right 1967   Removal of ingrown toenail [Other]    FAMHx:  Family History  Problem Relation Age of Onset  . Alzheimer's disease Mother   . Diabetes Mother   . Heart disease Maternal Uncle     SOCHx:   reports that he has been smoking cigarettes. He has a 60.00 pack-year smoking history. He has never used smokeless tobacco. He reports current alcohol use. He reports that he does not use drugs.  ALLERGIES:  Allergies  Allergen Reactions  . Coreg [Carvedilol] Shortness Of Breath    Shortness of breath with rechallenge 10/01/17  . Aspartame And Phenylalanine Nausea And Vomiting    ROS: Pertinent items noted in HPI and remainder of comprehensive ROS otherwise negative.  HOME MEDS: Current Outpatient Medications  Medication Sig Dispense Refill  . albuterol (PROVENTIL) (2.5 MG/3ML) 0.083% nebulizer solution INHALE 1 VIAL VIA NEBULIZER EVERY 6 HOURS AS NEEDED FOR WHEEZING OR SHORTNESS OF BREATH 375 mL 6  . aspirin 81 MG chewable tablet Chew 1 tablet (81 mg total) by mouth daily. 30 tablet 0  . budesonide (PULMICORT) 0.5 MG/2ML nebulizer solution INHALE 1 VIAL VIA NEBULIZER TWICE A DAY (PA REQ) 120 mL 3  . cholecalciferol (VITAMIN D) 1000 units tablet TAKE 1 TABLET BY MOUTH EVERY DAY 90 tablet 3  . clonazePAM (KLONOPIN) 0.5 MG  tablet Take 1 tablet (0.5 mg total) by mouth 2 (two) times daily. 60 tablet 4  . EPINEPHrine Base (PRIMATENE MIST IN) Inhale 1 puff into the lungs as needed (for breathing).    . furosemide (LASIX) 40 MG tablet Take 1 tablet (40 mg total) by mouth daily. 90 tablet 3  . gabapentin (NEURONTIN) 300 MG capsule TAKE 1 CAPSULE BY MOUTH THREE TIMES A DAY.  Please make an appointment for inperson visit 90 capsule 0  . ipratropium (ATROVENT) 0.02 % nebulizer solution Take 2.5 mLs (0.5 mg total) by nebulization every 6 (six) hours as needed for wheezing or shortness of breath. Please dispense QS x 1 month 312.5 mL 3  . Ipratropium-Albuterol (COMBIVENT RESPIMAT) 20-100 MCG/ACT AERS respimat INHALE 1 TO 2 PUFFS BY MOUTH EVERY 4 HOURS AS NEEDED FOR WHEEZE PLEASE PROVIDE 2 INHALERS 8 g 1  . losartan (COZAAR)  50 MG tablet Take 1 tablet (50 mg total) by mouth daily. PLEASE COME FOR OFFICE VISIT 30 tablet 0  . metoprolol succinate (TOPROL-XL) 25 MG 24 hr tablet Take 1.5 tablets (37.5 mg total) by mouth daily. Please keep upcoming appt for refills. Thank you 45 tablet 3  . Multiple Vitamin (MULTIVITAMIN WITH MINERALS) TABS tablet Take 1 tablet by mouth daily as needed (for vitamin).     . phenylephrine (4-WAY FAST ACTING) 1 % nasal spray Place 1 drop into both nostrils every 6 (six) hours as needed for congestion (4 way nasal spray).    . Pseudoeph-Doxylamine-DM-APAP (NYQUIL PO) as needed.    . sodium chloride (OCEAN) 0.65 % SOLN nasal spray Place 1 spray into both nostrils as needed for congestion.    Marland Kitchen venlafaxine XR (EFFEXOR-XR) 150 MG 24 hr capsule TAKE 1 CAPSULE (150 MG TOTAL) BY MOUTH DAILY WITH BREAKFAST. 90 capsule 0   No current facility-administered medications for this visit.    LABS/IMAGING: No results found for this or any previous visit (from the past 48 hour(s)). No results found.  VITALS: BP (!) 148/85   Pulse 92   Ht 5\' 8"  (1.727 m)   Wt 204 lb 12.8 oz (92.9 kg)   SpO2 98%   BMI 31.14 kg/m    EXAM: General appearance: alert and mild distress Neck: no carotid bruit and no JVD Lungs: Diminished breath sounds with rhonchi bilaterally Heart: regular rate and rhythm, S1, S2 normal, no murmur, click, rub or gallop Abdomen: soft, non-tender; bowel sounds normal; no masses,  no organomegaly Extremities: extremities normal, atraumatic, no cyanosis or edema Pulses: 2+ and symmetric Skin: Skin color, texture, turgor normal. No rashes or lesions Neurologic: Grossly normal Psych: Does not appear anxious  EKG: Sinus rhythm first-degree AV block at 92, right bundle branch block-personally reviewed  ASSESSMENT: 1. Nonischemic cardiomyopathy, EF 30-35% (10/2019) 2. Ongoing alcohol abuse, history of withdrawal seizure 3. History of anxiety  PLAN: 1.   Mr. Lento continues to struggle with alcohol abuse and unfortunately has a history of withdrawal seizure.  His wife is hesitant to stop supplying the alcohol Mr. Kaufhold is not interested in going to rehabilitation.  I suspect his cardiomyopathy will not improve without alcohol cessation.  He is on medications.  Recent labs demonstrated some elevation in BNP suggestive of volume overload.  He reports some mild shortness of breath however it is difficult to tell given his COPD symptoms.  He has had productive cough recently and shortness of breath which could be an active COPD exacerbation.  He has follow-up with his primary care provider in a couple days and may need additional treatment.  I would recommend increasing his Lasix further from 20 to 40 mg daily.  I will plan follow-up next month as scheduled.  Simeon Craft, MD, [facility], FACP  [place]  [facility]  Medical Director of the Advanced Lipid Disorders &  Cardiovascular Risk Reduction Clinic Diplomate of the American Board of Clinical Lipidology Attending Cardiologist  Direct Dial: [phone]  Fax: [phone]  Website:  [email or web address]  Taksh Hjort 11/01/2019, 4:59 PM

## 2019-11-01 NOTE — Patient Instructions (Signed)
Medication Instructions:  INCREASE furosemide (lasix) to 40mg  daily  *If you need a refill on your cardiac medications before your next appointment, please call your pharmacy*   Lab Work: CMET, BNP, Magnesium blood work in 2 weeks  If you have labs (blood work) drawn today and your tests are completely normal, you will receive your results only by: MyChart Message (if you have MyChart) OR . A paper copy in the mail If you have any lab test that is abnormal or we need to change your treatment, we will call you to review the results.   Testing/Procedures: NONE   Follow-Up: At [facility], you and your health needs are our priority.  As part of our continuing mission to provide you with exceptional heart care, we have created designated Provider Care Teams.  These Care Teams include your primary Cardiologist (physician) and Advanced Practice Providers (APPs -  Physician Assistants and Nurse Practitioners) who all work together to provide you with the care you need, when you need it.  We recommend signing up for the patient portal called "MyChart".  Sign up information is provided on this After Visit Summary.  MyChart is used to connect with patients for Virtual Visits (Telemedicine).  Patients are able to view lab/test results, encounter notes, upcoming appointments, etc.  Non-urgent messages can be sent to your provider as well.   To learn more about what you can do with MyChart, go to [facility].    Your next appointment:   11/17/19 @ 2:45pm

## 2019-11-03 ENCOUNTER — Other Ambulatory Visit: Payer: Self-pay

## 2019-11-03 ENCOUNTER — Ambulatory Visit (INDEPENDENT_AMBULATORY_CARE_PROVIDER_SITE_OTHER): Payer: Medicare HMO | Admitting: Family Medicine

## 2019-11-03 ENCOUNTER — Encounter: Payer: Self-pay | Admitting: Family Medicine

## 2019-11-03 VITALS — BP 162/88 | HR 94 | Wt 202.0 lb

## 2019-11-03 DIAGNOSIS — E871 Hypo-osmolality and hyponatremia: Secondary | ICD-10-CM

## 2019-11-03 DIAGNOSIS — J479 Bronchiectasis, uncomplicated: Secondary | ICD-10-CM

## 2019-11-03 DIAGNOSIS — Z23 Encounter for immunization: Secondary | ICD-10-CM | POA: Diagnosis not present

## 2019-11-03 DIAGNOSIS — R69 Illness, unspecified: Secondary | ICD-10-CM | POA: Diagnosis not present

## 2019-11-03 DIAGNOSIS — Z720 Tobacco use: Secondary | ICD-10-CM | POA: Diagnosis not present

## 2019-11-03 DIAGNOSIS — F1020 Alcohol dependence, uncomplicated: Secondary | ICD-10-CM | POA: Diagnosis not present

## 2019-11-03 NOTE — Assessment & Plan Note (Signed)
Likely will improve with increased diuresis and decreased beer.  Has labs ordered by cariology

## 2019-11-03 NOTE — Assessment & Plan Note (Signed)
Discussed his goals and tapering approach and how not to use patch unless has cut down a lot on his current cig use

## 2019-11-03 NOTE — Patient Instructions (Addendum)
Good to see you today!  Thanks for coming in.  For the smoking - start the patch the and cut down markedly or stop cigs  For the alcohol - gradually cut down to one 24 oz a day over 1-2 weeks  Make an appointment to see your pulmonologist - call if you have an trouble  Stop the gabapentin - let me know if you miss it  For the swelling - elevate your legs as much as possilbe, wear compression stockings above the knee and take 40 mg of lasix a day  Make an appointment to see me in 6 weeks.

## 2019-11-03 NOTE — Assessment & Plan Note (Signed)
Stable.  See after visit summary for plan to wean down.  At low risk for withdrawal if tapers slowly

## 2019-11-03 NOTE — Assessment & Plan Note (Signed)
Reiterated he needs to follow up with pulmonary.  Continue his current unusual regimen.  Does not seem to be in an exacerbation today

## 2019-11-03 NOTE — Chronic Care Management (AMB) (Signed)
  Care Management   Note  11/03/2019 Name: Juan [name] MRN: [id number] DOB: 08-02-1950  Juan [name] is a 69 y.o. year old male who is a primary care patient of Chambliss, Estill Batten, MD and is actively engaged with the care management team. I reached out to [name] by phone today to assist with re-scheduling a follow up visit with the RN Case Manager  Follow up plan: Unsuccessful telephone outreach attempt made. A HIPAA compliant phone message was left for the patient providing contact information and requesting a return call.  The care management team will reach out to the patient again over the next 7 days.  If patient returns call to provider office, please advise to call Embedded Care Management Care Guide Gwenevere Ghazi at [phone].  Gwenevere Ghazi  Care Guide, Embedded Care Coordination [facility] Management

## 2019-11-03 NOTE — Progress Notes (Signed)
    SUBJECTIVE:   CHIEF COMPLAINT / HPI:   COPD He feels he is having a little more congestion than usual.  No fever or shortness of breath or change in O2 use. Using all medications as per medication list  EDEMA His lasix does was increased by cardiology yesterday but he has not started yet.  Not wearing support hose  TOBACCO Smokes about a ppd.  Has patches and is planning to start using  ALCOHOL Currently drinks #2 twelve oz beers and #1 24 oz beer.  Would liek to cut down.  PERTINENT  PMH / PSH: wife provides all his care and does the shopping  OBJECTIVE:   BP (!) 162/88   Pulse 94   Wt 202 lb (91.6 kg)   SpO2 93%   BMI 30.71 kg/m   No distress in WC off O2 Lungs - breath sounds markedly decreased at bases with occsl mild wheeze Heart - RRR Legs - 2+ edema at both ankles   ASSESSMENT/PLAN:   Bronchiectasis without acute exacerbation (HCC) Reiterated he needs to follow up with pulmonary.  Continue his current unusual regimen.  Does not seem to be in an exacerbation today  Alcohol use disorder, severe, dependence (HCC) Stable.  See after visit summary for plan to wean down.  At low risk for withdrawal if tapers slowly   Chronic hyponatremia Likely will improve with increased diuresis and decreased beer.  Has labs ordered by cariology   Tobacco abuse Discussed his goals and tapering approach and how not to use patch unless has cut down a lot on his current cig use      Carney Living, MD [facility]

## 2019-11-04 NOTE — Progress Notes (Signed)
   Covid-19 Vaccination Clinic  Name:  DESTIN VINSANT    MRN: [id number] DOB: 1950-10-27  11/04/2019  Mr. Swails was observed post Covid-19 immunization for 15 minutes without incident. He was provided with Vaccine Information Sheet and instruction to access the V-Safe system.   Mr. Bowns was instructed to call 911 with any severe reactions post vaccine: Marland Kitchen Difficulty breathing  . Swelling of face and throat  . A fast heartbeat  . A bad rash all over body  . Dizziness and weakness

## 2019-11-08 ENCOUNTER — Encounter: Payer: Self-pay | Admitting: Family Medicine

## 2019-11-09 ENCOUNTER — Ambulatory Visit: Payer: Medicare HMO

## 2019-11-10 NOTE — Chronic Care Management (AMB) (Signed)
  Care Management   Note  11/10/2019 Name: Juan [name] MRN: [id number] DOB: [birth year]  Juan [name] is a 69 y.o. year old male who is a primary care patient of Chambliss, Estill Batten, MD and is actively engaged with the care management team. I reached out to [name] by phone today to assist with re-scheduling a follow up visit with the RN Case Manager.  Follow up plan: Telephone appointment with care management team member scheduled for:11/09/2019  [facility] Guide, Embedded Care Coordination [facility] Management

## 2019-11-15 NOTE — Patient Instructions (Signed)
Visit Information  Goals Addressed              This Visit's Progress   .  I have a rash after getting my vaccines and i am itching. (pt-stated)        CARE PLAN ENTRY (see longitudinal plan of care for additional care plan information)  Current Barriers:  . Impaired comfort r/t pruritis - patient called and had a covid shot and flu shot on the same day.  Patient broke out in rash on both arms and back.  He was itching.  Wife called nurse line over the week end for information.  Nurse Case Manager Clinical Goal(s):  Marland Kitchen Over the next 14 days, patient will verbalize understanding of plan  to calm his pruritis  Interventions:  . Inter-disciplinary care team collaboration (see longitudinal plan of care) . Discussed plans with patient for ongoing care management follow up and provided patient with direct contact information for care management team . Spouse states that the rash still looks the same  on both arms and back.  It is not itching as much .  She gave him a Claritin and use cortisone gel . He has not temperature no pain redness or swelling . Suggested that she can use a cold pack to help soothe the area, Calamine lotion or Aveeno lotion. . Call the office with any question or concerns  Patient Self Care Activities:  . Patient/spouse verbalizes understanding of plan Attends all scheduled provider appointments . Calls pharmacy for medication refills . Calls provider office for new concerns or questions   Initial goal documentation                       Mr. Sanden was given information about Care Management services today including:  1. Care Management services include personalized support from designated clinical staff supervised by his physician, including individualized plan of care and coordination with other care providers 2. 24/7 contact phone numbers for assistance for urgent and routine care needs. 3. The patient may stop CCM services at any time  (effective at the end of the month) by phone call to the office staff.  Patient agreed to services and verbal consent obtained.   The patient verbalized understanding of instructions provided today and declined a print copy of patient instruction materials.   The care management team will reach out to the patient again over the next 10 days.   [name] RN, BSN, [facility] Care Management Coordinator [facility] Phone: [phone] Fax: [phone]

## 2019-11-15 NOTE — Chronic Care Management (AMB) (Signed)
  Care Management   Follow Up Note   11/15/2019 Late Entry Name: Juan [name] MRN: [id number] DOB: 1950/08/10  Referred by: Carney Living, MD Reason for referral : Chronic Care Management (Vaccine F/U)   Juan [name] is a 69 y.o. year old male who is a primary care patient of Chambliss, Estill Batten, MD. The care management team was consulted for assistance with care management and care coordination needs.    Review of patient status, including review of consultants reports, relevant laboratory and other test results, and collaboration with appropriate care team members and the patient's provider was performed as part of comprehensive patient evaluation and provision of chronic care management services.    SDOH (Social Determinants of Health) assessments performed: No See Care Plan activities for detailed interventions related to [facility])     Advanced Directives: See Care Plan and Vynca application for related entries.   Goals Addressed              This Visit's Progress   .  I have a rash after getting my vaccines and i am itching. (pt-stated)        CARE PLAN ENTRY (see longitudinal plan of care for additional care plan information)  Current Barriers:  . Impaired comfort r/t pruritis - patient called and had a covid shot and flu shot on the same day.  Patient broke out in rash on both arms and back.  He was itching.  Wife called nurse line over the week end for information.  Nurse Case Manager Clinical Goal(s):  Marland Kitchen Over the next 14 days, patient will verbalize understanding of plan  to calm his pruritis  Interventions:  . Inter-disciplinary care team collaboration (see longitudinal plan of care) . Discussed plans with patient for ongoing care management follow up and provided patient with direct contact information for care management team . Spouse states that the rash still looks the same  on both arms and back.  It is not itching as much .  She gave him a  Claritin and use cortisone gel . He has not temperature no pain redness or swelling . Suggested that she can use a cold pack to help soothe the area, Calamine lotion or Aveeno lotion. . Call the office with any question or concerns  Patient Self Care Activities:  . Patient/spouse verbalizes understanding of plan Attends all scheduled provider appointments . Calls pharmacy for medication refills . Calls provider office for new concerns or questions   Initial goal documentation                        The care management team will reach out to the patient again over the next 10 days.   [name] RN, BSN, [facility] Care Management Coordinator [facility] Phone: [phone] Fax: [phone]

## 2019-11-16 DIAGNOSIS — R0602 Shortness of breath: Secondary | ICD-10-CM | POA: Diagnosis not present

## 2019-11-16 DIAGNOSIS — I10 Essential (primary) hypertension: Secondary | ICD-10-CM | POA: Diagnosis not present

## 2019-11-17 ENCOUNTER — Ambulatory Visit: Payer: Medicare HMO | Admitting: Internal Medicine

## 2019-11-17 ENCOUNTER — Other Ambulatory Visit: Payer: Self-pay | Admitting: Family Medicine

## 2019-11-17 ENCOUNTER — Other Ambulatory Visit: Payer: Self-pay

## 2019-11-17 VITALS — BP 132/84 | HR 88 | Temp 96.8°F | Ht 66.0 in | Wt 205.0 lb

## 2019-11-17 DIAGNOSIS — R69 Illness, unspecified: Secondary | ICD-10-CM | POA: Diagnosis not present

## 2019-11-17 DIAGNOSIS — I5043 Acute on chronic combined systolic (congestive) and diastolic (congestive) heart failure: Secondary | ICD-10-CM

## 2019-11-17 DIAGNOSIS — J441 Chronic obstructive pulmonary disease with (acute) exacerbation: Secondary | ICD-10-CM

## 2019-11-17 DIAGNOSIS — I426 Alcoholic cardiomyopathy: Secondary | ICD-10-CM

## 2019-11-17 LAB — MAGNESIUM: Magnesium: 1.8 mg/dL (ref 1.6–2.3)

## 2019-11-17 LAB — COMPREHENSIVE METABOLIC PANEL
ALT: 28 IU/L (ref 0–44)
AST: 29 IU/L (ref 0–40)
Albumin/Globulin Ratio: 1.6 (ref 1.2–2.2)
Albumin: 4.3 g/dL (ref 3.8–4.8)
Alkaline Phosphatase: 87 IU/L (ref 44–121)
BUN/Creatinine Ratio: 12 (ref 10–24)
BUN: 10 mg/dL (ref 8–27)
Bilirubin Total: 0.5 mg/dL (ref 0.0–1.2)
CO2: 23 mmol/L (ref 20–29)
Calcium: 9.6 mg/dL (ref 8.6–10.2)
Chloride: 88 mmol/L — ABNORMAL LOW (ref 96–106)
Creatinine, Ser: 0.82 mg/dL (ref 0.76–1.27)
GFR calc Af Amer: 106 mL/min/{1.73_m2} (ref >59)
GFR calc non Af Amer: 92 mL/min/{1.73_m2} (ref >59)
Globulin, Total: 2.7 g/dL (ref 1.5–4.5)
Glucose: 121 mg/dL — ABNORMAL HIGH (ref 65–99)
Potassium: 4.8 mmol/L (ref 3.5–5.2)
Sodium: 128 mmol/L — ABNORMAL LOW (ref 134–144)
Total Protein: 7 g/dL (ref 6.0–8.5)

## 2019-11-17 LAB — BRAIN NATRIURETIC PEPTIDE: BNP: 31.8 pg/mL (ref 0.0–100.0)

## 2019-11-17 MED ORDER — MAGNESIUM 400 MG PO TABS: 400.0000 mg | ORAL_TABLET | Freq: Every day | ORAL | 3 refills | Status: DC

## 2019-11-17 NOTE — Patient Instructions (Signed)
Medication Instructions:  START magnesium 400mg  daily CONTINUE all other current medications *If you need a refill on your cardiac medications before your next appointment, please call your pharmacy*    Follow-Up: At [facility], you and your health needs are our priority.  As part of our continuing mission to provide you with exceptional heart care, we have created designated Provider Care Teams.  These Care Teams include your primary Cardiologist (physician) and Advanced Practice Providers (APPs -  Physician Assistants and Nurse Practitioners) who all work together to provide you with the care you need, when you need it.  We recommend signing up for the patient portal called "MyChart".  Sign up information is provided on this After Visit Summary.  MyChart is used to connect with patients for Virtual Visits (Telemedicine).  Patients are able to view lab/test results, encounter notes, upcoming appointments, etc.  Non-urgent messages can be sent to your provider as well.   To learn more about what you can do with MyChart, go to [facility].    Your next appointment:   3 month(s)  The format for your next appointment:   In Person  Provider:   You may see [email or web address], MD or one of the following Advanced Practice Providers on your designated Care Team:    Chrystie Nose, PA-C  Azalee Course, PA-C or   Micah Flesher, Judy Pimple    Other Instructions

## 2019-11-17 NOTE — Progress Notes (Signed)
OFFICE NOTE  Chief Complaint:  Follow-up  Primary Care Physician: Lind Covert, MD  HPI:  Juan [name] is a 69 yo male with a history of heavy drinking for the past 15 years he has been drinking 4-5 drinks a day. He presented to [facility] after seizure like activity. Wife noted rhythmic jerking bilaterally for about 30 seconds. He was unresponsive and post ictal afterwards for about 30 min. Did not bite his tongue, no bladder or bowel incontinence. No history of head injury recently. 2 years ago he had a similar episode after abstaining from alcohol and stopping his klonopin suddenly. This time wife thinks he has been taking his medications. Of note his Zoloft has recently been changed to Effexor. He also presented with an aspiration pneumonia and was treated for such. It was felt that he had acute on chronic systolic heart failure exacerbation. An echocardiogram was performed which demonstrated a severe cardiomyopathy with global hypokinesis and EF of 30-35%.  He underwent a nuclear stress test which did not demonstrate any signs of ischemia therefore was thought that he had a nonischemic cardiomyopathy possibly related to alcohol use.   He was diuresed and then placed on appropriate heart failure medications. A life vest was then placed because he had some episodes of nonsustained ventricular tachycardia in a hospital. It was thought this may be due to a metabolic derangement. Since discharge she has been clinically stable. His weight has remained stable and he has had no further signs of heart failure. He denies any shocks with a life vest. He does report that it may be hurting his back somewhat. He has been abstinent from alcohol since his hospitalization. His only other main concern is that he has history of anxiety and felt that he did better on Zoloft and Effexor versus Effexor alone. He wishes to discuss this with his psychiatrist, but is apparently taken off of Zoloft  due to hyponatremia. I suspect this may be due to heart failure, not the medication. If he does feel that he did better on Zoloft it would be reasonable to consider restarting that. I think anything we can do to keep him from drinking would be best for his heart.  Juan [name] returns today for follow-up. Has not seen him in over year and a half. In the interim he's been hospitalized about 5 times. He's had numerous episodes of alcohol abuse and withdrawal. Fortunately, repeat echo in August 2015 showed his EF had improved back to normal. He discontinue the life vest at that time. It's been very difficult to understand if he's compliant with his medications when he is binging on alcohol. After discharge this time he feels that he is still significantly weak and fatigued. Heart rate is noted to be elevated today with a right bundle branch block. He denies any chest pain or worsening shortness of breath. Weight has been very stable if not less than it had been previously. He did have significant diarrhea.  11/01/2019  Juan [name] returns today for follow-up.  He was recently seen via telemedicine visit this past year and had not been seen since 2016.  He continues to struggle with a cardiomyopathy and recent echo showed further decline in LVEF down to 30 to 35%.  Alcohol use is a problem.  Apparently has a history of some withdraw and his wife is hesitant to stop supplying him the alcohol however he cannot drive and has visual difficulty that keeps him from leaving  the house.  He also continues to smoke and is suffering from COPD.  Today reports productive cough and shortness of breath with signs of possible COPD exacerbation.  He does have a follow-up with his primary care provider in 2 days from now.  11/17/2019  Juan [name] returns today for follow-up.  He had repeat labs which do show an improvement in his congestive heart failure.  His shortness of breath is mildly improved however I think in  general most of it is related to COPD.  His follow-up profile is stable with a creatinine of 0.82 after starting Lasix 40 mg daily.  His sodium has come up from 124-128.  His BNP is very low at magnesium was slightly low at 1.8.  Serum is normal at 4.8.  PMHx:  Past Medical History:  Diagnosis Date  . Alcoholism ([place])   . Allergy    seasonal   . Anxiety   . Asthma   . CHF (congestive heart failure) ([place]) 10/2010   ECHO:  EF 40%, Grade II diastolic dysfunction  . Chronic diastolic heart failure ([place])   . COPD (chronic obstructive pulmonary disease) ([place])   . COPD exacerbation ([place])   . Depression   . Headache   . Hyperlipidemia   . Hypertension   . Lung nodule seen on imaging study 06/15/2013   Needs follow-up in 3 months (early 2017)   . RBBB 11/09/2012  . Seizures ([place])   . Tremor 07/30/2011   Likely secondary to long-term alcohol abuse. Did discuss how his tremors affecting his alcoholism. We also switching her from albuterol to Atrovent in case this is causing any worsening of his tremor.   Marland Kitchen Umbilical hernia 02/12/2991    Past Surgical History:  Procedure Laterality Date  . TOENAIL EXCISION Right 1967   Removal of ingrown toenail [Other]    FAMHx:  Family History  Problem Relation Age of Onset  . Alzheimer's disease Mother   . Diabetes Mother   . Heart disease Maternal Uncle     SOCHx:   reports that he has been smoking cigarettes. He has a 60.00 pack-year smoking history. He has never used smokeless tobacco. He reports current alcohol use. He reports that he does not use drugs.  ALLERGIES:  Allergies  Allergen Reactions  . Coreg [Carvedilol] Shortness Of Breath    Shortness of breath with rechallenge 10/01/17  . Aspartame And Phenylalanine Nausea And Vomiting    ROS: Pertinent items noted in HPI and remainder of comprehensive ROS otherwise negative.  HOME MEDS: Current Outpatient Medications  Medication Sig Dispense Refill  . albuterol (PROVENTIL) (2.5 MG/3ML)  0.083% nebulizer solution INHALE 1 VIAL VIA NEBULIZER EVERY 6 HOURS AS NEEDED FOR WHEEZING OR SHORTNESS OF BREATH 375 mL 6  . aspirin 81 MG chewable tablet Chew 1 tablet (81 mg total) by mouth daily. 30 tablet 0  . budesonide (PULMICORT) 0.5 MG/2ML nebulizer solution INHALE 1 VIAL VIA NEBULIZER TWICE A DAY (PA REQ) 120 mL 3  . cholecalciferol (VITAMIN D) 1000 units tablet TAKE 1 TABLET BY MOUTH EVERY DAY 90 tablet 3  . clonazePAM (KLONOPIN) 0.5 MG tablet Take 1 tablet (0.5 mg total) by mouth 2 (two) times daily. 60 tablet 4  . EPINEPHrine Base (PRIMATENE MIST IN) Inhale 1 puff into the lungs as needed (for breathing).    . furosemide (LASIX) 40 MG tablet Take 1 tablet (40 mg total) by mouth daily. 90 tablet 3  . gabapentin (NEURONTIN) 300 MG capsule Take 300 mg  by mouth daily.    Marland Kitchen ipratropium (ATROVENT) 0.02 % nebulizer solution Take 2.5 mLs (0.5 mg total) by nebulization every 6 (six) hours as needed for wheezing or shortness of breath. Please dispense QS x 1 month 312.5 mL 3  . Ipratropium-Albuterol (COMBIVENT RESPIMAT) 20-100 MCG/ACT AERS respimat INHALE 1 TO 2 PUFFS BY MOUTH EVERY 4 HOURS AS NEEDED FOR WHEEZE PLEASE PROVIDE 2 INHALERS 8 g 1  . losartan (COZAAR) 50 MG tablet Take 1 tablet (50 mg total) by mouth daily. PLEASE COME FOR OFFICE VISIT 30 tablet 0  . metoprolol succinate (TOPROL-XL) 25 MG 24 hr tablet Take 1.5 tablets (37.5 mg total) by mouth daily. Please keep upcoming appt for refills. Thank you 45 tablet 3  . Multiple Vitamin (MULTIVITAMIN WITH MINERALS) TABS tablet Take 1 tablet by mouth daily as needed (for vitamin).     . phenylephrine (4-WAY FAST ACTING) 1 % nasal spray Place 1 drop into both nostrils every 6 (six) hours as needed for congestion (4 way nasal spray).    . Pseudoeph-Doxylamine-DM-APAP (NYQUIL PO) as needed.    . sodium chloride (OCEAN) 0.65 % SOLN nasal spray Place 1 spray into both nostrils as needed for congestion.    Marland Kitchen venlafaxine XR (EFFEXOR-XR) 150 MG 24 hr  capsule TAKE 1 CAPSULE (150 MG TOTAL) BY MOUTH DAILY WITH BREAKFAST. 90 capsule 0   No current facility-administered medications for this visit.    LABS/IMAGING: Results for orders placed or performed in visit on 11/01/19 (from the past 48 hour(s))  Comprehensive metabolic panel     Status: Abnormal   Collection Time: 11/16/19 12:05 PM  Result Value Ref Range   Glucose 121 (H) 65 - 99 mg/dL   BUN 10 8 - 27 mg/dL   Creatinine, Ser 0.82 0.76 - 1.27 mg/dL   GFR calc non Af Amer 92 >59 mL/min/1.73   GFR calc Af Amer 106 >59 mL/min/1.73    Comment: **[facility] currently reports eGFR in compliance with the current**   recommendations of the [employer]. [facility] will   update reporting as new guidelines are published from the NKF-ASN   Task force.    BUN/Creatinine Ratio 12 10 - 24   Sodium 128 (L) 134 - 144 mmol/L   Potassium 4.8 3.5 - 5.2 mmol/L   Chloride 88 (L) 96 - 106 mmol/L   CO2 23 20 - 29 mmol/L   Calcium 9.6 8.6 - 10.2 mg/dL   Total Protein 7.0 6.0 - 8.5 g/dL   Albumin 4.3 3.8 - 4.8 g/dL   Globulin, Total 2.7 1.5 - 4.5 g/dL   Albumin/Globulin Ratio 1.6 1.2 - 2.2   Bilirubin Total 0.5 0.0 - 1.2 mg/dL   Alkaline Phosphatase 87 44 - 121 IU/L    Comment:               **Please note reference interval change**   AST 29 0 - 40 IU/L   ALT 28 0 - 44 IU/L  Brain natriuretic peptide     Status: None   Collection Time: 11/16/19 12:05 PM  Result Value Ref Range   BNP 31.8 0.0 - 100.0 pg/mL  Magnesium     Status: None   Collection Time: 11/16/19 12:05 PM  Result Value Ref Range   Magnesium 1.8 1.6 - 2.3 mg/dL   No results found.  VITALS: BP 132/84 (BP Location: Left Arm, Patient Position: Sitting, Cuff Size: Normal)   Pulse 88   Temp (!) 96.8 F (36 C)  Ht '5\' 6"'  (1.676 m)   Wt 205 lb (93 kg)   BMI 33.09 kg/m   EXAM: General appearance: alert and mild distress Neck: no carotid bruit and no JVD Lungs: Diminished breath sounds with rhonchi  bilaterally Heart: regular rate and rhythm, S1, S2 normal, no murmur, click, rub or gallop Abdomen: soft, non-tender; bowel sounds normal; no masses,  no organomegaly Extremities: extremities normal, atraumatic, no cyanosis or edema and Bilateral excoriated rash Pulses: 2+ and symmetric Skin: Skin color, texture, turgor normal. No rashes or lesions Neurologic: Grossly normal Psych: Pleasant  EKG: Deferred  ASSESSMENT: 1. Acute on chronic systolic congestive heart failure, NYHA class III symptoms 2. Nonischemic cardiomyopathy, EF 30-35% (10/2019) 3. Ongoing alcohol abuse, history of withdrawal seizure 4. History of anxiety 5. COPD  PLAN: 1.   Juan [name] recent acute on chronic systolic congestive heart failure symptoms which improved with increase in Lasix.  His creatinine has been stable.  BNP is low.  Potassium is normal however his magnesium is slightly low.  I would prescribe magnesium oxide 40 mg daily.  He does have history of withdrawal seizure secondary to alcohol use.  Primarily I think his main issue now is with COPD and combination of heart failure.  We will plan to continue his current medications and follow-up with me in 3 months.  Pixie Casino, MD, [facility], [place] Director of the Advanced Lipid Disorders &  Cardiovascular Risk Reduction Clinic Diplomate of the American Board of Clinical Lipidology Attending Cardiologist  Direct Dial: [phone]  Fax: [phone]  Website:  [email or web address] Tanique Matney 11/17/2019, 2:49 PM

## 2019-11-22 ENCOUNTER — Telehealth: Payer: Self-pay

## 2019-11-22 ENCOUNTER — Telehealth: Payer: Medicare HMO

## 2019-11-22 NOTE — Telephone Encounter (Signed)
°  Care Management   Outreach Note  11/22/2019 Name: Juan [name] MRN: [id number] DOB: 06-May-1950  Referred by: Carney Living, MD Reason for referral : Appointment (Rash Follow up)   An unsuccessful telephone outreach was attempted today. The patient was referred to the case management team for assistance with care management and care coordination.   Follow Up Plan: A HIPAA compliant phone message was left for the patient providing contact information and requesting a return call.  The care management team will reach out to the patient again over the next 7-10 days.   [name] RN, BSN, [facility] Care Management Coordinator [facility] Phone: [phone] Fax: [phone]

## 2019-11-25 ENCOUNTER — Telehealth: Payer: Self-pay | Admitting: *Deleted

## 2019-11-25 ENCOUNTER — Telehealth: Payer: Medicare HMO | Admitting: Internal Medicine

## 2019-11-25 NOTE — Chronic Care Management (AMB) (Signed)
  Care Management   Note  11/25/2019 Name: Juan [name] MRN: [id number] DOB: Feb 19, 1950  [name] is a 69 y.o. year old male who is a primary care patient of Chambliss, Estill Batten, MD and is actively engaged with the care management team. I reached out to [name] by phone today to assist with re-scheduling a follow up visit with the RN Case Manager  Follow up plan: Unsuccessful telephone outreach attempt made. A HIPAA compliant phone message was left for the patient providing contact information and requesting a return call.  The care management team will reach out to the patient again over the next 7 days.  If patient returns call to provider office, please advise to call Embedded Care Management Care Guide Gwenevere Ghazi at [phone].  Gwenevere Ghazi  Care Guide, Embedded Care Coordination [facility] Management

## 2019-11-28 DIAGNOSIS — R69 Illness, unspecified: Secondary | ICD-10-CM | POA: Diagnosis not present

## 2019-11-29 NOTE — Telephone Encounter (Signed)
Traci spoke to both Mr Casasola and spouse [name] they would like for you to call [name] the spouse for follow up call.  Thank you   Misty Stanley

## 2019-11-29 NOTE — Chronic Care Management (AMB) (Signed)
  Care Management   Note  11/29/2019 Name: Juan [name] MRN: [id number] DOB: 1950/12/01  [name] is a 69 y.o. year old male who is a primary care patient of Chambliss, Estill Batten, MD and is actively engaged with the care management team. I reached out to [name] by phone today to assist with re-scheduling a follow up visit with the RN Case Manager.  Follow up plan: Telephone appointment with care management team member scheduled for:01/02/2020 Spoke with Kate Sable who gave verbal authorization to speak with wife [name] about health or appointments.    Gwenevere Ghazi  Care Guide, Embedded Care Coordination [facility] Management

## 2019-12-02 ENCOUNTER — Ambulatory Visit: Payer: Medicare HMO

## 2019-12-02 NOTE — Patient Instructions (Signed)
Visit Information  Goals Addressed              This Visit's Progress     COMPLETED: I have a rash after getting my vaccines and i am itching. (pt-stated)        CARE PLAN ENTRY (see longitudinal plan of care for additional care plan information)  Current Barriers:   Impaired comfort r/t pruritis - patient called and had a covid shot and flu shot on the same day.  Patient broke out in rash on both arms and back.  He was itching.  Wife called nurse line over the week end for information.  Nurse Case Manager Clinical Goal(s):   Over the next 14 days, patient will verbalize understanding of plan  to calm his pruritis  Interventions:   Inter-disciplinary care team collaboration (see longitudinal plan of care)  Discussed plans with patient for ongoing care management follow up and provided patient with direct contact information for care management team  Spouse states that the rash still looks the same  on both arms and back.  It is not itching as much .  She gave him a Claritin and use cortisone gel  He has not temperature no pain redness or swelling  Suggested that she can use a cold pack to help soothe the area, Calamine lotion or Aveeno lotion.  Call the office with any question or concerns  Followed up with [name] today about the patient's rash. She states that the rash has gradually cleared up.  He has a few scabs from were he had scratched.  He is no longer bothered by the itching  His palms are peeling but other than that he is doing good.  They have no other concerns   Patient Self Care Activities:   Patient/spouse verbalizes understanding of plan Attends all scheduled provider appointments  Calls pharmacy for medication refills  Calls provider office for new concerns or questions   Initial goal documentation                      COMPLETED: I would like potable oxygen (pt-stated)        CARE PLAN ENTRY (see longitudinal plan of care for  additional care plan information)  Current Barriers:   Care Coordination needs related to portable oxygen in a patient with Chronic systolic heart failure, Atrial fibrillation with RVR, Paroxysmal atrial fibrillation, Bronchiectasis without acute exacerbation.  Nurse Case Manager Clinical Goal(s):   Over the next 30 days, patient will verbalize understanding of plan for portable oxygen  Interventions:   Inter-disciplinary care team collaboration (see longitudinal plan of care)  Evaluation of current treatment plan related to portable oxygen and patient's adherence to plan as established by provider.  Advised patient to go to his appointment on Monday for echo and use his 3 ft. etank in carrier for now until he can have a test for a new portable tank to see if he will qualify.  Collaborated with [employer]  regarding information test needed for smaller portable tanks.  The patient will need to have an order for a Best fit Eval.  Discussed plans with patient for ongoing care management follow up and provided patient with direct contact information for care management team  Spoke with spouse today and she states that her husband still does not feel well. He is eating just sandwiches.  I encouraged her to try nutritional drinks like Boost or Glucerna if he can hold them down or likes them  for nutritional purposes.  She said that the doctor had tried to call her last week to give her information about his  labs but she did not get a chance to speak with her.  I advised her to call back today  She stated that her husband did not go for his echo and is rescheduled for 10/18/19.  I advised her that she can revisit the oxygen at a later time when her husband feels better.  Patient Self Care Activities:   Patient verbalizes understanding of plan for portable oxygen  Self administers medications as prescribed  Attends all scheduled provider appointments  Calls provider office for new concerns or  questions  Unable to independently self navigate DME for portable oxygen  Please see past updates related to this goal by clicking on the "Past Updates" button in the selected goal         Juan [name] was given information about Care Management services today including:  1. Care Management services include personalized support from designated clinical staff supervised by his physician, including individualized plan of care and coordination with other care providers 2. 24/7 contact phone numbers for assistance for urgent and routine care needs. 3. The patient may stop CCM services at any time (effective at the end of the month) by phone call to the office staff.  Patient agreed to services and verbal consent obtained.   The patient verbalized understanding of instructions provided today and declined a print copy of patient instruction materials.   Plan:  No further follow up needed.  [name] RN, BSN, [facility] Care Management Coordinator [facility] Phone: [phone] Fax: [phone]

## 2019-12-02 NOTE — Chronic Care Management (AMB) (Signed)
RN  Care Management   Follow Up Note   12/02/2019 Name: Juan [name] MRN: [id number] DOB: 1950-07-03  Reason for referral : Appointment (Rash Follow up)   Juan [name] is a 69 y.o. year old male who is a primary care patient of Chambliss, Estill Batten, MD. The care management team was consulted for assistance with care management and care coordination needs.    Subjective:" He is doing better"  Assessment: Called to follow up on the patient's rash.   Goals Addressed              This Visit's Progress   .  COMPLETED: I have a rash after getting my vaccines and i am itching. (pt-stated)        CARE PLAN ENTRY (see longitudinal plan of care for additional care plan information)  Current Barriers:  . Impaired comfort r/t pruritis - patient called and had a covid shot and flu shot on the same day.  Patient broke out in rash on both arms and back.  He was itching.  Wife called nurse line over the week end for information.  Nurse Case Manager Clinical Goal(s):  Marland Kitchen Over the next 14 days, patient will verbalize understanding of plan  to calm his pruritis  Interventions:  . Inter-disciplinary care team collaboration (see longitudinal plan of care) . Discussed plans with patient for ongoing care management follow up and provided patient with direct contact information for care management team . Spouse states that the rash still looks the same  on both arms and back.  It is not itching as much .  She gave him a Claritin and use cortisone gel . He has not temperature no pain redness or swelling . Suggested that she can use a cold pack to help soothe the area, Calamine lotion or Aveeno lotion. . Call the office with any question or concerns . Followed up with [name] today about the patient's rash. She states that the rash has gradually cleared up.  He has a few scabs from were he had scratched. Marland Kitchen He is no longer bothered by the itching . His palms are peeling but other than that  he is doing good. . They have no other concerns   Patient Self Care Activities:  . Patient/spouse verbalizes understanding of plan Attends all scheduled provider appointments . Calls pharmacy for medication refills . Calls provider office for new concerns or questions   Initial goal documentation                    .  COMPLETED: I would like potable oxygen (pt-stated)        CARE PLAN ENTRY (see longitudinal plan of care for additional care plan information)  Current Barriers:  . Care Coordination needs related to portable oxygen in a patient with Chronic systolic heart failure, Atrial fibrillation with RVR, Paroxysmal atrial fibrillation, Bronchiectasis without acute exacerbation.  Nurse Case Manager Clinical Goal(s):  Marland Kitchen Over the next 30 days, patient will verbalize understanding of plan for portable oxygen  Interventions:  . Inter-disciplinary care team collaboration (see longitudinal plan of care) . Evaluation of current treatment plan related to portable oxygen and patient's adherence to plan as established by provider. . Advised patient to go to his appointment on Monday for echo and use his 3 ft. etank in carrier for now until he can have a test for a new portable tank to see if he will qualify. [name] with [employer]  regarding  information test needed for smaller portable tanks.  The patient will need to have an order for a Best fit Eval. . Discussed plans with patient for ongoing care management follow up and provided patient with direct contact information for care management team . Spoke with spouse today and she states that her husband still does not feel well. He is eating just sandwiches.  I encouraged her to try nutritional drinks like Boost or Glucerna if he can hold them down or likes them for nutritional purposes. . She said that the doctor had tried to call her last week to give her information about his  labs but she did not get a chance to speak  with her.  I advised her to call back today . She stated that her husband did not go for his echo and is rescheduled for 10/18/19. . I advised her that she can revisit the oxygen at a later time when her husband feels better.  Patient Self Care Activities:  . Patient verbalizes understanding of plan for portable oxygen . Self administers medications as prescribed . Attends all scheduled provider appointments . Calls provider office for new concerns or questions . Unable to independently self navigate DME for portable oxygen  Please see past updates related to this goal by clicking on the "Past Updates" button in the selected goal         Review of patient status, including review of consultants reports, relevant laboratory and other test results, and collaboration with appropriate care team members and the patient's provider was performed as part of comprehensive patient evaluation and provision of chronic care management services.    SDOH (Social Determinants of Health) assessments performed: No See Care Plan activities for detailed interventions related to [facility])    Plan:  No further follow up needed.  [name] RN, BSN, [facility] Care Management Coordinator [facility] Phone: [phone] Fax: [phone]

## 2019-12-21 ENCOUNTER — Ambulatory Visit: Payer: Medicare HMO | Admitting: Family Medicine

## 2019-12-23 ENCOUNTER — Other Ambulatory Visit: Payer: Self-pay | Admitting: Family Medicine

## 2019-12-23 DIAGNOSIS — R69 Illness, unspecified: Secondary | ICD-10-CM | POA: Diagnosis not present

## 2019-12-26 ENCOUNTER — Other Ambulatory Visit: Payer: Self-pay | Admitting: Internal Medicine

## 2019-12-29 ENCOUNTER — Institutional Professional Consult (permissible substitution): Payer: Medicare HMO | Admitting: Pulmonary Disease

## 2020-01-11 ENCOUNTER — Ambulatory Visit: Payer: Medicare HMO | Admitting: Family Medicine

## 2020-02-17 ENCOUNTER — Encounter: Payer: Self-pay | Admitting: Internal Medicine

## 2020-02-17 ENCOUNTER — Telehealth (INDEPENDENT_AMBULATORY_CARE_PROVIDER_SITE_OTHER): Payer: Medicare HMO | Admitting: Internal Medicine

## 2020-02-17 VITALS — BP 145/81 | HR 87 | Temp 97.4°F | Ht 66.0 in | Wt 204.0 lb

## 2020-02-17 DIAGNOSIS — I426 Alcoholic cardiomyopathy: Secondary | ICD-10-CM | POA: Diagnosis not present

## 2020-02-17 DIAGNOSIS — E871 Hypo-osmolality and hyponatremia: Secondary | ICD-10-CM | POA: Diagnosis not present

## 2020-02-17 DIAGNOSIS — I5022 Chronic systolic (congestive) heart failure: Secondary | ICD-10-CM

## 2020-02-17 DIAGNOSIS — I10 Essential (primary) hypertension: Secondary | ICD-10-CM | POA: Diagnosis not present

## 2020-02-17 DIAGNOSIS — E785 Hyperlipidemia, unspecified: Secondary | ICD-10-CM

## 2020-02-17 DIAGNOSIS — R69 Illness, unspecified: Secondary | ICD-10-CM | POA: Diagnosis not present

## 2020-02-17 NOTE — Progress Notes (Signed)
Virtual Visit via Telephone Note   This visit type was conducted due to national recommendations for restrictions regarding the COVID-19 Pandemic (e.g. social distancing) in an effort to limit this patient's exposure and mitigate transmission in our community.  Due to his co-morbid illnesses, this patient is at least at moderate risk for complications without adequate follow up.  This format is felt to be most appropriate for this patient at this time.  The patient did not have access to video technology/had technical difficulties with video requiring transitioning to audio format only (telephone).  All issues noted in this document were discussed and addressed.  No physical exam could be performed with this format.  Please refer to the patient's chart for his  consent to telehealth for [facility].   Date:  02/17/2020   ID:  Juan [name], DOB 1950-10-10, MRN [id number] The patient was identified using 2 identifiers.  Evaluation Performed:  Follow-Up Visit  Patient Location:  [address] [place] [address]  Provider location:   [address] [place], [place] [address]  PCP:  Carney Living, MD  Cardiologist:  Chrystie Nose, MD Electrophysiologist:  None   Chief Complaint:  I feel the same  History of Present Illness:    Juan [name] is a 70 y.o. male who presents via audio/video conferencing for a telehealth visit today. Juan [name] is a 70 yo male with a history of heavy drinking for the past 15 years he has been drinking 4-5 drinks a day. He presented to [facility] after seizure like activity. Wife noted rhythmic jerking bilaterally for about 30 seconds. He was unresponsive and post ictal afterwards for about 30 min. Did not bite his tongue, no bladder or bowel incontinence. No history of head injury recently. 2 years ago he had a similar episode after abstaining from alcohol and stopping his klonopin suddenly. This time wife  thinks he has been taking his medications. Of note his Zoloft has recently been changed to Effexor. He also presented with an aspiration pneumonia and was treated for such. It was felt that he had acute on chronic systolic heart failure exacerbation. An echocardiogram was performed which demonstrated a severe cardiomyopathy with global hypokinesis and EF of 30-35%.  He underwent a nuclear stress test which did not demonstrate any signs of ischemia therefore was thought that he had a nonischemic cardiomyopathy possibly related to alcohol use.   He was diuresed and then placed on appropriate heart failure medications. A life vest was then placed because he had some episodes of nonsustained ventricular tachycardia in a hospital. It was thought this may be due to a metabolic derangement. Since discharge she has been clinically stable. His weight has remained stable and he has had no further signs of heart failure. He denies any shocks with a life vest. He does report that it may be hurting his back somewhat. He has been abstinent from alcohol since his hospitalization. His only other main concern is that he has history of anxiety and felt that he did better on Zoloft and Effexor versus Effexor alone. He wishes to discuss this with his psychiatrist, but is apparently taken off of Zoloft due to hyponatremia. I suspect this may be due to heart failure, not the medication. If he does feel that he did better on Zoloft it would be reasonable to consider restarting that. I think anything we can do to keep him from drinking would be best for his heart.  Juan [name] returns today for follow-up. Has not seen him in over year and a half. In the interim he's been hospitalized about 5 times. He's had numerous episodes of alcohol abuse and withdrawal. Fortunately, repeat echo in August 2015 showed his EF had improved back to normal. He discontinue the life vest at that time. It's been very difficult to understand if he's  compliant with his medications when he is binging on alcohol. After discharge this time he feels that he is still significantly weak and fatigued. Heart rate is noted to be elevated today with a right bundle branch block. He denies any chest pain or worsening shortness of breath. Weight has been very stable if not less than it had been previously. He did have significant diarrhea.  11/01/2019  Juan [name] returns today for follow-up.  He was recently seen via telemedicine visit this past year and had not been seen since 2016.  He continues to struggle with a cardiomyopathy and recent echo showed further decline in LVEF down to 30 to 35%.  Alcohol use is a problem.  Apparently has a history of some withdraw and his wife is hesitant to stop supplying him the alcohol however he cannot drive and has visual difficulty that keeps him from leaving the house.  He also continues to smoke and is suffering from COPD.  Today reports productive cough and shortness of breath with signs of possible COPD exacerbation.  He does have a follow-up with his primary care provider in 2 days from now.  11/17/2019  Juan [name] returns today for follow-up.  He had repeat labs which do show an improvement in his congestive heart failure.  His shortness of breath is mildly improved however I think in general most of it is related to COPD.  His follow-up profile is stable with a creatinine of 0.82 after starting Lasix 40 mg daily.  His sodium has come up from 124-128.  His BNP is very low at magnesium was slightly low at 1.8.  Serum potassium is normal at 4.8.   02/17/2020  Juan [name] returns today for follow-up via telehealth visit.  He has really had a lot of trauma in the family recently with some deaths and serious illness.  He seems somewhat down today.  He thinks his heart failure symptoms are stable.  Weight is 204 today which is 1 pound less than previous.  He denies any worsening swelling.  He reports a chronic  persistent cough related to COPD with no recent increase in mucus production.  He denies any issues with cramping.  His magnesium was low but he is on repletion for that.  Potassium has been normal.  The patient does not have symptoms concerning for COVID-19 infection (fever, chills, cough, or new SHORTNESS OF BREATH).    Prior CV studies:   The following studies were reviewed today:  Chart reviewed  PMHx:  Past Medical History:  Diagnosis Date  . Alcoholism (HCC)   . Allergy    seasonal   . Anxiety   . Asthma   . CHF (congestive heart failure) (HCC) 10/2010   ECHO:  EF 40%, Grade II diastolic dysfunction  . Chronic diastolic heart failure (HCC)   . COPD (chronic obstructive pulmonary disease) (HCC)   . COPD exacerbation (HCC)   . Depression   . Headache   . Hyperlipidemia   . Hypertension   . Lung nodule seen on imaging study 06/15/2013   Needs follow-up in 3 months (early 2017)   .  RBBB 11/09/2012  . Seizures (HCC)   . Tremor 07/30/2011   Likely secondary to long-term alcohol abuse. Did discuss how his tremors affecting his alcoholism. We also switching her from albuterol to Atrovent in case this is causing any worsening of his tremor.   Marland Kitchen Umbilical hernia 08/06/2012    Past Surgical History:  Procedure Laterality Date  . TOENAIL EXCISION Right 1967   Removal of ingrown toenail [Other]    FAMHx:  Family History  Problem Relation Age of Onset  . Alzheimer's disease Mother   . Diabetes Mother   . Heart disease Maternal Uncle     SOCHx:   reports that he has been smoking cigarettes. He has a 60.00 pack-year smoking history. He has never used smokeless tobacco. He reports current alcohol use. He reports that he does not use drugs.  ALLERGIES:  Allergies  Allergen Reactions  . Coreg [Carvedilol] Shortness Of Breath    Shortness of breath with rechallenge 10/01/17  . Aspartame And Phenylalanine Nausea And Vomiting    MEDS:  Current Meds  Medication Sig  .  albuterol (PROVENTIL) (2.5 MG/3ML) 0.083% nebulizer solution INHALE 1 VIAL VIA NEBULIZER EVERY 6 HOURS AS NEEDED FOR WHEEZING OR SHORTNESS OF BREATH  . aspirin 81 MG chewable tablet Chew 1 tablet (81 mg total) by mouth daily.  . budesonide (PULMICORT) 0.5 MG/2ML nebulizer solution INHALE 1 VIAL VIA NEBULIZER TWICE A DAY (PA REQ)  . cholecalciferol (VITAMIN D) 1000 units tablet TAKE 1 TABLET BY MOUTH EVERY DAY  . clonazePAM (KLONOPIN) 0.5 MG tablet Take 1 tablet (0.5 mg total) by mouth 2 (two) times daily.  . COMBIVENT RESPIMAT 20-100 MCG/ACT AERS respimat INHALE 1 TO 2 PUFFS BY MOUTH EVERY 4 HOURS AS NEEDED FOR WHEEZE PLEASE PROVIDE 2 INHALERS  . EPINEPHrine Base (PRIMATENE MIST IN) Inhale 1 puff into the lungs as needed (for breathing).  . furosemide (LASIX) 40 MG tablet Take 1 tablet (40 mg total) by mouth daily.  Marland Kitchen gabapentin (NEURONTIN) 300 MG capsule Take 300 mg by mouth daily.  Marland Kitchen ipratropium (ATROVENT) 0.02 % nebulizer solution TAKE 2.5 MLS (0.5 MG TOTAL) BY NEBULIZATION EVERY 6 (SIX) HOURS AS NEEDED FOR WHEEZING OR SHORTNESS OF BREATH  . losartan (COZAAR) 50 MG tablet Take 1 tablet (50 mg total) by mouth daily. PLEASE COME FOR OFFICE VISIT  . Magnesium 400 MG TABS Take 400 mg by mouth daily.  . metoprolol succinate (TOPROL-XL) 25 MG 24 hr tablet TAKE 1.5 TABLETS (37.5 MG TOTAL) BY MOUTH DAILY. PLEASE KEEP UPCOMING APPT FOR REFILLS. THANK YOU  . Multiple Vitamin (MULTIVITAMIN WITH MINERALS) TABS tablet Take 1 tablet by mouth daily as needed (for vitamin).   . phenylephrine (NEO-SYNEPHRINE) 1 % nasal spray Place 1 drop into both nostrils every 6 (six) hours as needed for congestion (4 way nasal spray).  . Pseudoeph-Doxylamine-DM-APAP (NYQUIL PO) as needed.  . sodium chloride (OCEAN) 0.65 % SOLN nasal spray Place 1 spray into both nostrils as needed for congestion.  Marland Kitchen venlafaxine XR (EFFEXOR-XR) 150 MG 24 hr capsule TAKE 1 CAPSULE (150 MG TOTAL) BY MOUTH DAILY WITH BREAKFAST.      ROS: Pertinent items noted in HPI and remainder of comprehensive ROS otherwise negative.  Labs/Other Tests and Data Reviewed:    Recent Labs: 10/18/2019: Hemoglobin 16.3; NT-Pro BNP 561; Platelets 325 11/16/2019: ALT 28; BNP 31.8; BUN 10; Creatinine, Ser 0.82; Magnesium 1.8; Potassium 4.8; Sodium 128   Recent Lipid Panel Lab Results  Component Value Date/Time   CHOL  158 10/18/2019 08:46 AM   TRIG 114 10/18/2019 08:46 AM   HDL 40 10/18/2019 08:46 AM   CHOLHDL 4.0 10/18/2019 08:46 AM   CHOLHDL 4.4 02/13/2016 02:46 PM   LDLCALC 97 10/18/2019 08:46 AM   LDLDIRECT 74 07/20/2012 11:24 AM    Wt Readings from Last 3 Encounters:  02/17/20 204 lb (92.5 kg)  11/17/19 205 lb (93 kg)  11/03/19 202 lb (91.6 kg)     Exam:    Vital Signs:  BP (!) 145/81   Pulse 87   Temp (!) 97.4 F (36.3 C)   Ht 5\' 6"  (1.676 m)   Wt 204 lb (92.5 kg)   SpO2 97%   BMI 32.93 kg/m    Exam not performed due to telephone visit  ASSESSMENT & PLAN:    1. Acute on chronic systolic congestive heart failure, NYHA class II symptoms 2. Nonischemic cardiomyopathy, EF 30-35% (10/2019) 3. Ongoing alcohol abuse, history of withdrawal seizure 4. History of anxiety 5. COPD  Mr. Ragle has been stable without any worsening shortness of breath or heart failure symptoms.  When I would say he has more class II symptoms.  He denies any worsening COPD or productive cough.  He has had some issues with ongoing anxiety and alcohol use.  He is under a lot of stress with several deaths or serious illness in the family which she was telling me about today.  Otherwise I would not make any changes to his medicines.  Plan follow-up in the office in 6 months or sooner as necessary.  COVID-19 Education: The signs and symptoms of COVID-19 were discussed with the patient and how to seek care for testing (follow up with PCP or arrange E-visit).  The importance of social distancing was discussed today.  Patient Risk:   After  full review of this patients clinical status, I feel that they are at least moderate risk at this time.  Time:   Today, I have spent 25 minutes with the patient with telehealth technology discussing heart failure, alcohol use, COPD.     Medication Adjustments/Labs and Tests Ordered: Current medicines are reviewed at length with the patient today.  Concerns regarding medicines are outlined above.   Tests Ordered: No orders of the defined types were placed in this encounter.   Medication Changes: No orders of the defined types were placed in this encounter.   Disposition:  in 6 month(s)  Simeon Craft, MD, [facility], FACP  [place]  [facility]  Medical Director of the Advanced Lipid Disorders &  Cardiovascular Risk Reduction Clinic Diplomate of the American Board of Clinical Lipidology Attending Cardiologist  Direct Dial: [phone]  Fax: [phone]  Website:  [email or web address]  [phone], MD  02/17/2020 10:44 AM

## 2020-02-17 NOTE — Patient Instructions (Signed)

## 2020-03-01 ENCOUNTER — Other Ambulatory Visit: Payer: Self-pay | Admitting: Family Medicine

## 2020-03-28 ENCOUNTER — Other Ambulatory Visit: Payer: Self-pay | Admitting: Family Medicine

## 2020-04-20 ENCOUNTER — Other Ambulatory Visit: Payer: Self-pay | Admitting: Family Medicine

## 2020-05-10 ENCOUNTER — Other Ambulatory Visit: Payer: Self-pay | Admitting: Internal Medicine

## 2020-05-10 ENCOUNTER — Other Ambulatory Visit: Payer: Self-pay | Admitting: Family Medicine

## 2020-05-11 ENCOUNTER — Other Ambulatory Visit: Payer: Self-pay | Admitting: Family Medicine

## 2020-05-13 ENCOUNTER — Encounter: Payer: Self-pay | Admitting: Family Medicine

## 2020-05-17 ENCOUNTER — Ambulatory Visit: Payer: Medicare HMO | Admitting: Family Medicine

## 2020-05-17 NOTE — Progress Notes (Deleted)
    SUBJECTIVE:   CHIEF COMPLAINT / HPI:   ALCOHOL  TOBACCO    PERTINENT  PMH / PSH: ***  OBJECTIVE:   There were no vitals taken for this visit.  ***  ASSESSMENT/PLAN:   No problem-specific Assessment & Plan notes found for this encounter.     Carney Living, MD [facility]   {    This will disappear when note is signed, click to select method of visit    :1}

## 2020-05-22 ENCOUNTER — Other Ambulatory Visit: Payer: Self-pay

## 2020-05-22 ENCOUNTER — Telehealth: Payer: Self-pay | Admitting: *Deleted

## 2020-05-22 DIAGNOSIS — I5022 Chronic systolic (congestive) heart failure: Secondary | ICD-10-CM

## 2020-05-22 NOTE — Chronic Care Management (AMB) (Signed)
  Care Management   Note  05/22/2020 Name: JOSEMIGUEL GRIES MRN: [id number] DOB: 02/18/1950  RISHAAN GUNNER is a 70 y.o. year old male who is a primary care patient of Chambliss, Estill Batten, MD. I reached out to [name] by phone today in response to a referral sent by Mr. Skyelar Swigart Chancellor's PCP Carney Living, MD .    Mr. Rodenberg was given information about care management services today including:  1. Care management services include personalized support from designated clinical staff supervised by his physician, including individualized plan of care and coordination with other care providers 2. 24/7 contact phone numbers for assistance for urgent and routine care needs. 3. The patient may stop care management services at any time by phone call to the office staff.  Spouse Rennie Rouch verbally agreed to assistance and services provided by embedded care coordination/care management team today.  Follow up plan: Telephone appointment with care management team member scheduled for:05/23/2020  [facility] Guide, Embedded Care Coordination [facility] Management

## 2020-05-23 ENCOUNTER — Ambulatory Visit: Payer: Medicare HMO

## 2020-05-23 NOTE — Patient Instructions (Addendum)
[name]  it was nice speaking with you. Please call me directly [phone] if you have questions about the goals we discussed.  Goals Addressed            This Visit's Progress   . Lifestyle Change       Timeframe:  Long-Range Goal Priority:  High Start Date:   05/23/20                      Expected End Date:   08/03/20                    Patient Juan [name] Self Care Activities related to Atrial Fibrillation, CHF, HTN, and COPD:  Patient verbalizes understanding of plan Calls pharmacy for medication refills Call's provider office for new concerns or questions Patient will go to all of his appointments. Patient will use resources for help    Why is this important?    The changes that you are asked to make may be hard to do.   This is especially true when the changes are life-long.   Knowing why it is important to you is the first step.   Working on the change with your family or support person helps you not feel alone.   Reward yourself and family or support person when goals are met. This can be an activity you choose like bowling, hiking, biking, swimming or shooting hoops.     Notes:        Patient Care Plan: RN Case Manager  Problem Identified: Chronic Conditions   Long-Range Goal: Patient will monitor and maitain chronic conditions to sustain quality of life   Start Date: 05/23/2020  Expected End Date: 08/03/2020  Priority: High  . Chronic Disease Management support, education, and care coordination needs related to HTN and COPD- Per Spouse patient drinks beer and smokes and has fallen twice one 05/20/20 and 05/21/20 no injury . His BP was 109/64 after his fall. Other readings 155/99,163/113, HR 41 was at 2:30 pm and it alarmed her.  He does have Afib at times. Uses O2 prn. 151/91 HR 78 at 2:45pm. Took his BP meds later and it was 133/83.  She said he cancelled his last MD appointment.  Patient has since rescheduled an appointment on 06/13/20 with Dr Erin Hearing.  Per  spouse he did not want an appointment any sooner that.   Clinical Goal(s) related to HTN and COPD:  Over the next 60 days, patient will:  . Work with the care management team to address educational, disease management, and care coordination needs  . Call provider office for new or worsened signs and symptoms  . Call care management team with questions or concerns . Verbalize basic understanding of patient centered plan of care established today  Interventions related to HTN and COPD:  . Evaluation of current treatment plans and patient's adherence to plan as established by provider . Assessed patient/spouse understanding of disease states . Assessed patient's/spouse 's education and care coordination needs . Provided disease specific education to patient/spouse  . Patient BP today 115/66 HR 98 . Discussed with the spouse about the issues the patient is having and resources that have been offered previously, for  smoking,cessation, appointment with pharmacy, information for psychiatry and Alcohol. All the patient has declined.  Encouraged the spouse to inform her husband the we are here for him to help.  Juan [name] with appropriate clinical care team members regarding patient needs-Juan [name] patient needs an  appointment to come in to discuss issues and be ready to accept help.  Patient Juan [name] Self Care Activities related to HTN and COPD:  Patient verbalizes understanding of plan Calls pharmacy for medication refills Call's provider office for new concerns or questions Patient will go to all of his appointments. Patient will use resources for help       Juan [name] received Care Management services today:  1. Care Management services include personalized support from designated clinical staff supervised by his physician, including individualized plan of care and coordination with other care providers 2. 24/7 contact [phone] for assistance for urgent and routine care  needs. 3. Care Management are voluntary services and be declined at any time by calling the office.  Patient verbalizes understanding of instructions provided today.    Follow Up Plan: RNCM will remain available for 60 days.  If no further needs are assessed at this time [facility] will be removed from care team.   Juan Arms, RN   [phone]

## 2020-05-23 NOTE — Chronic Care Management (AMB) (Signed)
Care Management    RN Visit Note  05/23/2020 Name: Juan [name] MRN: [id number] DOB: 10-26-1950  Subjective: Juan [name] is a 70 y.o. year old male who is a primary care patient of Chambliss, Estill Batten, MD. The care management team was consulted for assistance with disease management and care coordination needs.    Engaged with patient by telephone for initial visit in response to provider referral for case management and/or care coordination services.   Consent to Services:   Juan [name] was given information about Care Management services today including:  1. Care Management services includes personalized support from designated clinical staff supervised by his physician, including individualized plan of care and coordination with other care providers 2. 24/7 contact phone numbers for assistance for urgent and routine care needs. 3. The patient may stop case management services at any time by phone call to the office staff.  Patient agreed to services and consent obtained.    Assessment: Patient is currently experiencing difficulty with variations with his BP and Heart rate... See Care Plan below for interventions and patient self-care actives. Follow up Plan: RNCM will remain available for 60 days.  If no further needs are assessed at this time RNCM will be removed from care team. : Review of patient past medical history, allergies, medications, health status, including review of consultants reports, laboratory and other test data, was performed as part of comprehensive evaluation and provision of chronic care management services.   SDOH (Social Determinants of Health) assessments and interventions performed:    Care Plan  Allergies  Allergen Reactions  . Coreg [Carvedilol] Shortness Of Breath    Shortness of breath with rechallenge 10/01/17  . Aspartame And Phenylalanine Nausea And Vomiting    Outpatient Encounter Medications as of 05/23/2020  Medication Sig Note   . albuterol (PROVENTIL) (2.5 MG/3ML) 0.083% nebulizer solution INHALE 1 VIAL VIA NEBULIZER EVERY 6 HOURS AS NEEDED FOR WHEEZING OR SHORTNESS OF BREATH   . aspirin 81 MG chewable tablet Chew 1 tablet (81 mg total) by mouth daily.   . budesonide (PULMICORT) 0.5 MG/2ML nebulizer solution INHALE 1 VIAL VIA NEBULIZER TWICE A DAY (PA REQ)   . cholecalciferol (VITAMIN D) 1000 units tablet TAKE 1 TABLET BY MOUTH EVERY DAY   . clonazePAM (KLONOPIN) 0.5 MG tablet TAKE 1 TABLET (0.5 MG TOTAL) BY MOUTH 2 (TWO) TIMES DAILY. NEED OFFICE VISIT BEFORE MORE REFILLS   . COMBIVENT RESPIMAT 20-100 MCG/ACT AERS respimat INHALE 1 TO 2 PUFFS BY MOUTH EVERY 4 HOURS AS NEEDED FOR WHEEZE PLEASE PROVIDE 2 INHALERS   . EPINEPHrine Base (PRIMATENE MIST IN) Inhale 1 puff into the lungs as needed (for breathing).   . furosemide (LASIX) 40 MG tablet Take 1 tablet (40 mg total) by mouth daily.   Marland Kitchen gabapentin (NEURONTIN) 300 MG capsule TAKE 1 CAPSULE BY MOUTH THREE TIMES A DAY. PLEASE MAKE AN APPOINTMENT FOR INPERSON VISIT 05/23/2020: Wife says he takes this once a day.  . ipratropium (ATROVENT) 0.02 % nebulizer solution TAKE 2.5 MLS (0.5 MG TOTAL) BY NEBULIZATION EVERY 6 (SIX) HOURS AS NEEDED FOR WHEEZING OR SHORTNESS OF BREATH   . losartan (COZAAR) 50 MG tablet TAKE 1 TABLET (50 MG TOTAL) BY MOUTH DAILY. PLEASE COME FOR OFFICE VISIT   . Magnesium 400 MG TABS Take 400 mg by mouth daily.   . metoprolol succinate (TOPROL-XL) 25 MG 24 hr tablet TAKE 1.5 TABLETS (37.5 MG TOTAL) BY MOUTH DAILY. PLEASE KEEP UPCOMING APPT FOR REFILLS. THANK  YOU   . Multiple Vitamin (MULTIVITAMIN WITH MINERALS) TABS tablet Take 1 tablet by mouth daily as needed (for vitamin).    . phenylephrine (NEO-SYNEPHRINE) 1 % nasal spray Place 1 drop into both nostrils every 6 (six) hours as needed for congestion (4 way nasal spray).   . Pseudoeph-Doxylamine-DM-APAP (NYQUIL PO) as needed.   . sodium chloride (OCEAN) 0.65 % SOLN nasal spray Place 1 spray into both  nostrils as needed for congestion.   Marland Kitchen venlafaxine XR (EFFEXOR-XR) 150 MG 24 hr capsule TAKE 1 CAPSULE (150 MG TOTAL) BY MOUTH DAILY WITH BREAKFAST.   . [DISCONTINUED] albuterol (PROVENTIL,VENTOLIN) 90 MCG/ACT inhaler Inhale 2 puffs into the lungs every 6 (six) hours as needed for wheezing.    No facility-administered encounter medications on file as of 05/23/2020.    Patient Active Problem List   Diagnosis Date Noted  . Paroxysmal atrial fibrillation (HCC) 11/18/2017  . Adjustment disorder with depressed mood 10/20/2017  . Alcoholic cardiomyopathy (HCC) 97/35/3299  . Excessive physiologic tremor 10/02/2017  . Cataract 06/26/2017  . Alcohol use disorder, severe, dependence (HCC) 11/14/2014  . Atrial fibrillation with RVR (HCC) 11/14/2014  . Peripheral neuropathy 12/15/2013  . Bronchiectasis without acute exacerbation (HCC) 06/30/2013  . Chronic systolic heart failure (HCC) 06/13/2013  . Chronic hyponatremia 11/08/2012  . Umbilical hernia 08/06/2012  . Tobacco abuse 07/22/2012  . HTN (hypertension) 07/30/2011  . Hepatomegaly 11/05/2010    Conditions to be addressed/monitored: HTN and COPD  Care Plan : RN Case Manager  Updates made by Juan Fairly, RN since 05/23/2020 12:00 AM  Problem: Chronic Conditions   Long-Range Goal: Patient will monitor and maitain chronic conditions to sustain quality of life   Start Date: 05/23/2020  Expected End Date: 08/03/2020  Priority: High  . Chronic Disease Management support, education, and care coordination needs related to  HTN, and COPD- Per Spouse patient drinks beer and smokes and has fallen twice one 05/20/20 and 05/21/20 no injury . His BP was 109/64 after his fall. Other readings 155/99,163/113, HR 41 was at 2:30 pm and it alarmed her.  He does have Afib at times. Uses O2 prn. 151/91 HR 78 at 2:45pm. Took his BP meds later and it was 133/83.  She said he cancelled his last MD appointment.  Patient has since rescheduled an appointment on 06/13/20  with Dr Deirdre Priest.  Per spouse he did not want an appointment any sooner that.   Clinical Goal(s) related to HTN, and COPD:  Over the next 60 days, patient will:  . Work with the care management team to address educational, disease management, and care coordination needs  . Call provider office for new or worsened signs and symptoms  . Call care management team with questions or concerns . Verbalize basic understanding of patient centered plan of care established today  Interventions related to  HTN, and COPD:  . Evaluation of current treatment plans and patient's adherence to plan as established by provider . Assessed patient/spouse understanding of disease states . Assessed patient's/spouse 's education and care coordination needs . Provided disease specific education to patient/spouse  . Patient BP today 115/66 HR 98 . Discussed with the spouse about the issues the patient is having and resources that have been offered previously, for  smoking,cessation, appointment with pharmacy, information for psychiatry and Alcohol. All the patient has declined.  Encouraged the spouse to inform her husband the we are here for him to help.  [name] with appropriate clinical care team members regarding patient  needs-Dr. Deirdre Priest patient needs an appointment to come in to discuss issues and be ready to accept help.  Patient Dolores Lory Self Care Activities related to  HTN, and COPD:  Patient verbalizes understanding of plan Calls pharmacy for medication refills Call's provider office for new concerns or questions Patient will go to all of his appointments. Patient will use resources for help      Juan Fairly RN, BSN, [facility] Care Management Coordinator [facility] Phone: [phone] I Fax: [phone]

## 2020-05-28 ENCOUNTER — Other Ambulatory Visit: Payer: Self-pay | Admitting: Family Medicine

## 2020-05-29 ENCOUNTER — Telehealth: Payer: Self-pay

## 2020-05-29 MED ORDER — CLONAZEPAM 0.5 MG PO TABS: 0.5000 mg | ORAL_TABLET | Freq: Two times a day (BID) | ORAL | 0 refills | Status: DC

## 2020-05-29 NOTE — Telephone Encounter (Signed)
Patient's wife calls nurse line regarding Klonopin rx. Wife reports that follow up appointment has been scheduled for 5/10, however, patient only has two pills left. Wife is requesting partial refill to last until this appointment.   Please advise.   Veronda Prude, RN

## 2020-05-29 NOTE — Telephone Encounter (Signed)
Rx for 20 days sent in   Thanks  [facility]

## 2020-06-13 ENCOUNTER — Ambulatory Visit: Payer: Medicare HMO | Admitting: Family Medicine

## 2020-06-17 ENCOUNTER — Other Ambulatory Visit: Payer: Self-pay | Admitting: Family Medicine

## 2020-06-27 ENCOUNTER — Telehealth: Payer: Self-pay

## 2020-06-27 NOTE — Telephone Encounter (Signed)
    RN Case Manager Care Management   Phone Outreach    06/27/2020 Name: Juan [name] MRN: [id number] DOB: [birth year]  Juan [name] is a 70 y.o. year old male who is a primary care patient of Chambliss, Estill Batten, MD .   Telephone outreach was unsuccessful A HIPPA compliant phone message was left for the patient providing contact information and requesting a return call. .   Plan:Will reach out to patient again in the next 2 days. .   Review of patient status, including review of consultants reports, relevant laboratory and other test results, and collaboration with appropriate care team members and the patient's provider was performed as part of comprehensive patient evaluation and provision of care management services.    [name] RN, BSN, [facility] Care Management Coordinator [facility] Phone: [phone] I Fax: [phone]

## 2020-06-28 ENCOUNTER — Ambulatory Visit: Payer: Medicare HMO | Admitting: Family Medicine

## 2020-06-28 ENCOUNTER — Ambulatory Visit: Payer: Medicare HMO

## 2020-06-28 NOTE — Chronic Care Management (AMB) (Signed)
  Care Management  Collaboration  Note  06/28/2020 Name: Juan [name] MRN: [id number] DOB: 1950-05-02  Juan [name] is a 70 y.o. year old male who is a primary care patient of Chambliss, Estill Batten, MD. The CCM team was consulted for Collaboration  reference chronic disease management and or care coordination needs.    Assessment: RNCM recieved a call from RN Health Coach Gean Maidens to collaborate on the Mrs Osmond's husband.  The patient's wife had expressed feelings of concern for her husbands physical and mental health and RN Health Coach asked if I could call per wife's request.    Intervention: RNCM called and spoke with Juan Hines LCSW to initiate collaboration and conducted brief assessment, recommendations and relevant information discussed.   Follow up Plan: RNCM scheduled a call with the patient and LCSW Juan [name] to discuss counseling options and the patient is in agreement.  Review of patient past medical history, allergies, medications, and health status, including review of pertinent consultant reports was performed as part of comprehensive evaluation and provision of care management/care coordination services.      [name] RN, BSN, [facility] Care Management Coordinator [facility] Phone: [phone] I Fax: [phone]

## 2020-06-30 ENCOUNTER — Ambulatory Visit: Payer: Medicare HMO

## 2020-06-30 ENCOUNTER — Other Ambulatory Visit: Payer: Self-pay

## 2020-06-30 MED ORDER — CLONAZEPAM 0.5 MG PO TABS: 0.5000 mg | ORAL_TABLET | Freq: Two times a day (BID) | ORAL | 0 refills | Status: DC

## 2020-06-30 NOTE — Chronic Care Management (AMB) (Signed)
   RN Case Manager Care Management   Phone Outreach    06/30/2020 Name: Juan [name] MRN: [id number] DOB: July 21, 1950  Juan [name] is a 70 y.o. year old male who is a primary care patient of Chambliss, Estill Batten, MD .   CCM RNCM received a call today from the patient's wife needing Confirmation that the RX she called in was received at the office.  RNCM sent in a message to Virl Son CMA and she confirmed that the RX request was received and had been sent to the doctor.  RNCM call [name] back and notified her of the information.  Plan:No further needs required.  Review of patient status, including review of consultants reports, relevant laboratory and other test results, and collaboration with appropriate care team members and the patient's provider was performed as part of comprehensive patient evaluation and provision of care management services.    [name] RN, BSN, [facility] Care Management Coordinator [facility] Phone: [phone] I Fax: [phone]

## 2020-07-04 ENCOUNTER — Ambulatory Visit: Payer: Self-pay

## 2020-07-04 ENCOUNTER — Ambulatory Visit: Payer: Medicare HMO | Admitting: Licensed Clinical Social Worker

## 2020-07-04 DIAGNOSIS — Z7189 Other specified counseling: Secondary | ICD-10-CM

## 2020-07-04 DIAGNOSIS — F1020 Alcohol dependence, uncomplicated: Secondary | ICD-10-CM

## 2020-07-04 NOTE — Patient Instructions (Signed)
Licensed Clinical Social Worker Visit Information  Goals we discussed today:  Goals Addressed            This Visit's Progress   . Connect for mental health treatment   On track    Timeframe:  Short-Term Goal Priority:  High Start Date:   07/04/2020                          Expected End Date:                       Follow Up Date 07/07/2020    Patient Goals/Self-Care Activities: Over the next 30 days .  Contact [facility] psychiatry/ counseling and [facility] to schedule an appointment for psychiatry and counseling .  Continue with compliance of taking medication  . Keep appointment with Dr. Deirdre Priest       Juan [name] was given information about Care Management services today including:  1. Care Management services include personalized support from designated clinical staff supervised by his physician, including individualized plan of care and coordination with other care providers 2. 24/7 contact phone numbers for assistance for urgent and routine care needs. 3. The patient may stop Care Management services at any time by phone call to the office staff.  Patient agreed to services and verbal consent obtained.   Patient verbalizes understanding of instructions provided today and agrees to view in MyChart.   Follow up plan: SW will follow up with patient by phone over the next 3 days   SIGNATURE

## 2020-07-04 NOTE — Chronic Care Management (AMB) (Signed)
  Care Management  Collaboration  Note  07/04/2020 Name: Juan [name] MRN: [id number] DOB: 1950/06/13  Juan [name] is a 70 y.o. year old male who is a primary care patient of Chambliss, Estill Batten, MD. The CCM team was consulted for Collaboration  reference chronic disease management and or care coordination needs.    Assessment: Patient was not interviewed or contacted during this encounter.  CCM RNCM collaborated with CCM LCSW. CCM LCSW will work with the patient concerning his anxiety and depression so that he will be able to focus on his health conditions.   Intervention:  Conducted brief assessment, recommendations and relevant information discussed.   Follow up Plan: CCM LCSW will work closely with the patient to help him stay consistant until [facility] is able to come back from her leave of absences  Review of patient past medical history, allergies, medications, and health status, including review of pertinent consultant reports was performed as part of comprehensive evaluation and provision of care management/care coordination services.      [name] RN, BSN, [facility] Care Management Coordinator [facility] Phone: [phone] I Fax: [phone]

## 2020-07-04 NOTE — Chronic Care Management (AMB) (Signed)
Care Management Clinical Social Work Note  07/04/2020 Name: Juan [name] MRN: [id number] DOB: 04-Sep-1950  Juan [name] is a 70 y.o. year old male who is a primary care patient of Juan [name], Juan Batten, MD.  The Care Management team was consulted for assistance with chronic disease management and coordination needs.  Engaged with patient and wife by telephone for initial visit in response to provider referral for social work chronic care management and care coordination services for mental health needs.  Consent to Services:  Juan [name] was given information about Care Management services today including:  1. Care Management services includes personalized support from designated clinical staff supervised by his physician, including individualized plan of care and coordination with other care providers 2. 24/7 contact phone numbers for assistance for urgent and routine care needs. 3. The patient may stop case management services at any time by phone call to the office staff.  Patient agreed to services and consent obtained.   Assessment: Patient was accompanied by his wife who provided all /most information during this encounter. Patient is having difficulty getting to provider's office to manage chronic health concerns, currently experiencing symptoms of anxiety and depression which seems to be exacerbated by substance use and chronic medical conditions..Previous mental health treatment with Juan [name], however patient does not want to return to this office. See Care Plan below for interventions and patient self-care actives. Recommendation: Patient may benefit from, and is in agreement to allow his wife to call to schedule psychiatry appointment for medication evaluation and ongoing counseling.  Follow up Plan: Patient would like continued follow-up.  CCM LCSW will follow up with patient and wife in 3 days.. Patient will call office if needed prior to next encounter.    Review of  patient past medical history, allergies, medications, and health status, including review of relevant consultants reports was performed today as part of a comprehensive evaluation and provision of chronic care management and care coordination services.  SDOH (Social Determinants of Health) assessments and interventions performed:  SDOH Interventions   Flowsheet Row Most Recent Value  SDOH Interventions   Stress Interventions Provide Counseling  Alcohol Brief Interventions/Follow-up Patient Refused  [patient does not want to stop drinking]       Advanced Directives Status: Not addressed in this encounter.  Care Plan  Allergies  Allergen Reactions  . Coreg [Carvedilol] Shortness Of Breath    Shortness of breath with rechallenge 10/01/17  . Aspartame And Phenylalanine Nausea And Vomiting    Outpatient Encounter Medications as of 07/04/2020  Medication Sig Note  . albuterol (PROVENTIL) (2.5 MG/3ML) 0.083% nebulizer solution INHALE 1 VIAL VIA NEBULIZER EVERY 6 HOURS AS NEEDED FOR WHEEZING OR SHORTNESS OF BREATH   . aspirin 81 MG chewable tablet Chew 1 tablet (81 mg total) by mouth daily.   . budesonide (PULMICORT) 0.5 MG/2ML nebulizer solution INHALE 1 VIAL VIA NEBULIZER TWICE A DAY (PA REQ)   . cholecalciferol (VITAMIN D) 1000 units tablet TAKE 1 TABLET BY MOUTH EVERY DAY   . clonazePAM (KLONOPIN) 0.5 MG tablet Take 1 tablet (0.5 mg total) by mouth 2 (two) times daily. NEED OFFICE VISIT BEFORE MORE REFILLS   . COMBIVENT RESPIMAT 20-100 MCG/ACT AERS respimat INHALE 1 TO 2 PUFFS BY MOUTH EVERY 4 HOURS AS NEEDED FOR WHEEZE PLEASE PROVIDE 2 INHALERS   . EPINEPHrine Base (PRIMATENE MIST IN) Inhale 1 puff into the lungs as needed (for breathing).   . furosemide (LASIX) 40 MG tablet Take 1 tablet (40  mg total) by mouth daily.   Marland Kitchen gabapentin (NEURONTIN) 300 MG capsule TAKE 1 CAPSULE BY MOUTH THREE TIMES A DAY. PLEASE MAKE AN APPOINTMENT FOR INPERSON VISIT 05/23/2020: Wife says he takes this once a  day.  . ipratropium (ATROVENT) 0.02 % nebulizer solution TAKE 2.5 MLS (0.5 MG TOTAL) BY NEBULIZATION EVERY 6 (SIX) HOURS AS NEEDED FOR WHEEZING OR SHORTNESS OF BREATH   . losartan (COZAAR) 50 MG tablet TAKE 1 TABLET (50 MG TOTAL) BY MOUTH DAILY. PLEASE COME FOR OFFICE VISIT   . Magnesium 400 MG TABS Take 400 mg by mouth daily.   . metoprolol succinate (TOPROL-XL) 25 MG 24 hr tablet TAKE 1.5 TABLETS (37.5 MG TOTAL) BY MOUTH DAILY. PLEASE KEEP UPCOMING APPT FOR REFILLS. THANK YOU   . Multiple Vitamin (MULTIVITAMIN WITH MINERALS) TABS tablet Take 1 tablet by mouth daily as needed (for vitamin).    . phenylephrine (NEO-SYNEPHRINE) 1 % nasal spray Place 1 drop into both nostrils every 6 (six) hours as needed for congestion (4 way nasal spray).   . Pseudoeph-Doxylamine-DM-APAP (NYQUIL PO) as needed.   . sodium chloride (OCEAN) 0.65 % SOLN nasal spray Place 1 spray into both nostrils as needed for congestion.   Marland Kitchen venlafaxine XR (EFFEXOR-XR) 150 MG 24 hr capsule TAKE 1 CAPSULE (150 MG TOTAL) BY MOUTH DAILY WITH BREAKFAST.   . [DISCONTINUED] albuterol (PROVENTIL,VENTOLIN) 90 MCG/ACT inhaler Inhale 2 puffs into the lungs every 6 (six) hours as needed for wheezing.    No facility-administered encounter medications on file as of 07/04/2020.    Patient Active Problem List   Diagnosis Date Noted  . Paroxysmal atrial fibrillation (HCC) 11/18/2017  . Adjustment disorder with depressed mood 10/20/2017  . Alcoholic cardiomyopathy (HCC) 64/40/3474  . Excessive physiologic tremor 10/02/2017  . Cataract 06/26/2017  . Alcohol use disorder, severe, dependence (HCC) 11/14/2014  . Atrial fibrillation with RVR (HCC) 11/14/2014  . Peripheral neuropathy 12/15/2013  . Bronchiectasis without acute exacerbation (HCC) 06/30/2013  . Chronic systolic heart failure (HCC) 06/13/2013  . Chronic hyponatremia 11/08/2012  . Umbilical hernia 08/06/2012  . Tobacco abuse 07/22/2012  . HTN (hypertension) 07/30/2011  .  Hepatomegaly 11/05/2010    Conditions to be addressed/monitored: Anxiety and Depression; Substance abuse issues -  tobacco and alcohol  Care Plan : General Social Work (Adult)  Updates made by Soundra Pilon, LCSW since 07/04/2020 12:00 AM  Problem: Emotional Distress and anxiety   Goal: Manage symptoms of anxiety/ connect with mental health provider   Start Date: 07/04/2020  Expected End Date: 09/03/2020  This Visit's Progress: On track  Priority: High  Current barriers:   . Chronic Mental Health needs related to symptoms of anxiety and depression . Mental Health Concerns  and Substance abuse issues -  alcohol  24 to 40 oz per day( patient is not interested in stop drinking)  Needs Support, Education, and Care Coordination in order to meet unmet mental health needs. Clinical Goal(s): Over the next 30 days, patient will work with SW to connect with a mental health provider for ongoing counseling and medication evaluation.  Clinical Interventions:  . Assessed patient's previous treatment, needs, coping skills, current treatment, support system and barriers to care  . Other interventions: Solution-Focused Strategies, Active listening / Reflection utilized , Motivational Interviewing, Reviewed mental health medications with patient and discussed compliance: reports no missed doses but states medication is not working, and Provided basic mental health support, education   ; . Discussed several options for long term counseling based  on need and insurance.  . Assisted patient and wife with narrowing the options down to [facility]/ counseling and [name]) They will contact both providers to see which they would like to connect with. . Also explained and discussed options of making a psychiatry referral via [facility] ( patient and wife would like to wait) . Discussed treatment for alcohol and tobacco ( does not want treatment for alcohol but willing to discuss tobacco treatment in the  future) . Collaboration with CCM RN during this encounter for ongoing needs. Marland Kitchen Collaboration with PCP regarding development and update of comprehensive plan of care as evidenced by provider attestation and co-signature . Inter-disciplinary care team collaboration (see longitudinal plan of care) Patient Goals/Self-Care Activities: Over the next 30 days .  Contact [facility] psychiatry/ counseling and [facility] to schedule an appointment for psychiatry and counseling .  Continue with compliance of taking medication  . Keep appointment with Dr. Wynelle Beckmann, LCSW Care Management & Coordination  [facility] / [facility]   [phone] 2:40 PM

## 2020-07-11 ENCOUNTER — Other Ambulatory Visit: Payer: Self-pay | Admitting: Family Medicine

## 2020-07-12 ENCOUNTER — Encounter: Payer: Self-pay | Admitting: Family Medicine

## 2020-07-12 ENCOUNTER — Other Ambulatory Visit: Payer: Self-pay

## 2020-07-12 ENCOUNTER — Ambulatory Visit (INDEPENDENT_AMBULATORY_CARE_PROVIDER_SITE_OTHER): Payer: Medicare HMO

## 2020-07-12 ENCOUNTER — Ambulatory Visit (INDEPENDENT_AMBULATORY_CARE_PROVIDER_SITE_OTHER): Payer: Medicare HMO | Admitting: Family Medicine

## 2020-07-12 DIAGNOSIS — R69 Illness, unspecified: Secondary | ICD-10-CM | POA: Diagnosis not present

## 2020-07-12 DIAGNOSIS — Z23 Encounter for immunization: Secondary | ICD-10-CM | POA: Diagnosis not present

## 2020-07-12 DIAGNOSIS — F4321 Adjustment disorder with depressed mood: Secondary | ICD-10-CM

## 2020-07-12 DIAGNOSIS — I10 Essential (primary) hypertension: Secondary | ICD-10-CM | POA: Diagnosis not present

## 2020-07-12 DIAGNOSIS — K529 Noninfective gastroenteritis and colitis, unspecified: Secondary | ICD-10-CM

## 2020-07-12 DIAGNOSIS — E871 Hypo-osmolality and hyponatremia: Secondary | ICD-10-CM

## 2020-07-12 NOTE — Assessment & Plan Note (Signed)
Unsure of cause.  Needs colonoscopy. Plan to refer when he lets me know is willing to see GI

## 2020-07-12 NOTE — Progress Notes (Signed)
    SUBJECTIVE:   CHIEF COMPLAINT / HPI:   Follows up accompanied by his wife  Alcohol Drinking about 4-5 beers a day.  Wife buys.  He would like to cut down more  Tobacco Smokes 1 ppd.  Wife supplies Using nicorette to try not to smoke and thinks this ahs helped some   Adjustment Disorder Rarely leaves the house.  Does have an appointment with [facility] psychiatry on June 21.  Continue to take all medications on his list. Did not bring in   Chronic Diarrhea Has had for "long time"  Never had work up with GI  Gait Usually in WC.  Can walk very slowly around his house.  Maybe interesed in Physical Therapy   PERTINENT  PMH / PSH: Sees Dr [name] cardiology  OBJECTIVE:   BP (!) 141/86   Pulse 89   Wt 202 lb 6.4 oz (91.8 kg)   SpO2 94%   BMI 32.67 kg/m   Heart - Regular rate and rhythm.  No murmurs, gallops or rubs.    Lungs - poor inspiration  Siting in [facility] Alert and conversant. Psych:  Cognition and judgment appear intact. Alert, communicative  and cooperative with normal attention span and concentration. No apparent delusions, illusions, hallucinations   ASSESSMENT/PLAN:   Chronic diarrhea Unsure of cause.  Needs colonoscopy. Plan to refer when he lets me know is willing to see GI  Adjustment disorder with depressed mood Severe and prevents normal medical follow up.  Hopefully he keeps his appointment with psychiatry in June.  I informed them I would not continue to prescribe Klonopin after this   Chronic hyponatremia Check labs today      Carney Living, MD [facility]

## 2020-07-12 NOTE — Assessment & Plan Note (Signed)
Check labs today.

## 2020-07-12 NOTE — Assessment & Plan Note (Signed)
Severe and prevents normal medical follow up.  Hopefully he keeps his appointment with psychiatry in June.  I informed them I would not continue to prescribe Klonopin after this

## 2020-07-12 NOTE — Patient Instructions (Signed)
Good to see you today - Thank you for coming in  Things we discussed today:  I will call you if your tests are not good.  Otherwise, I will send you a message on MyChart (if it is active) or a letter in the mail..  If you do not hear from me with in 2 weeks please call our office.     Keep your appointment with [facility] - I will prescribe Klonopin for one month then the should take over  Let me know when you would like to be referred to  - Gastroenterology for the diarrhea - Pulmonary for your breathing  Continue to cut down drinking to get to one beer a day And smoking  Please always bring your medication bottles  Come back to see me in 1 month to continue discussions nd addressing your problems

## 2020-07-12 NOTE — Progress Notes (Signed)
   Covid-19 Vaccination Clinic  Name:  Juan [name]    MRN: [id number] DOB: Jul 11, 1950  07/12/2020  [name] was observed post Covid-19 immunization for 15 minutes without incident. He was provided with Vaccine Information Sheet and instruction to access the V-Safe system.   [name] was instructed to call 911 with any severe reactions post vaccine: Marland Kitchen Difficulty breathing  . Swelling of face and throat  . A fast heartbeat  . A bad rash all over body  . Dizziness and weakness

## 2020-07-13 LAB — CBC
Hematocrit: 43.3 % (ref 37.5–51.0)
Hemoglobin: 15.4 g/dL (ref 13.0–17.7)
MCH: 31.8 pg (ref 26.6–33.0)
MCHC: 35.6 g/dL (ref 31.5–35.7)
MCV: 89 fL (ref 79–97)
Platelets: 280 10*3/uL (ref 150–450)
RBC: 4.85 x10E6/uL (ref 4.14–5.80)
RDW: 11.8 % (ref 11.6–15.4)
WBC: 9.1 10*3/uL (ref 3.4–10.8)

## 2020-07-13 LAB — CMP14+EGFR
ALT: 31 IU/L (ref 0–44)
AST: 27 IU/L (ref 0–40)
Albumin/Globulin Ratio: 1.6 (ref 1.2–2.2)
Albumin: 4.4 g/dL (ref 3.8–4.8)
Alkaline Phosphatase: 83 IU/L (ref 44–121)
BUN/Creatinine Ratio: 14 (ref 10–24)
BUN: 11 mg/dL (ref 8–27)
Bilirubin Total: 0.3 mg/dL (ref 0.0–1.2)
CO2: 19 mmol/L — ABNORMAL LOW (ref 20–29)
Calcium: 9.2 mg/dL (ref 8.6–10.2)
Chloride: 93 mmol/L — ABNORMAL LOW (ref 96–106)
Creatinine, Ser: 0.81 mg/dL (ref 0.76–1.27)
Globulin, Total: 2.7 g/dL (ref 1.5–4.5)
Glucose: 130 mg/dL — ABNORMAL HIGH (ref 65–99)
Potassium: 4.7 mmol/L (ref 3.5–5.2)
Sodium: 133 mmol/L — ABNORMAL LOW (ref 134–144)
Total Protein: 7.1 g/dL (ref 6.0–8.5)
eGFR: 96 mL/min/{1.73_m2} (ref >59)

## 2020-07-13 LAB — TSH: TSH: 2.47 u[IU]/mL (ref 0.450–4.500)

## 2020-07-13 MED ORDER — CLONAZEPAM 0.5 MG PO TABS: 0.5000 mg | ORAL_TABLET | Freq: Two times a day (BID) | ORAL | 0 refills | Status: DC

## 2020-07-22 IMAGING — CR DG CHEST 2V
2 series · 2 of 2 positions shown · non-contrast
Comparison: 03/25/2017

CLINICAL DATA: Nausea and vomiting for 1 week

EXAM:
CHEST - 2 VIEW

[w chest lat]
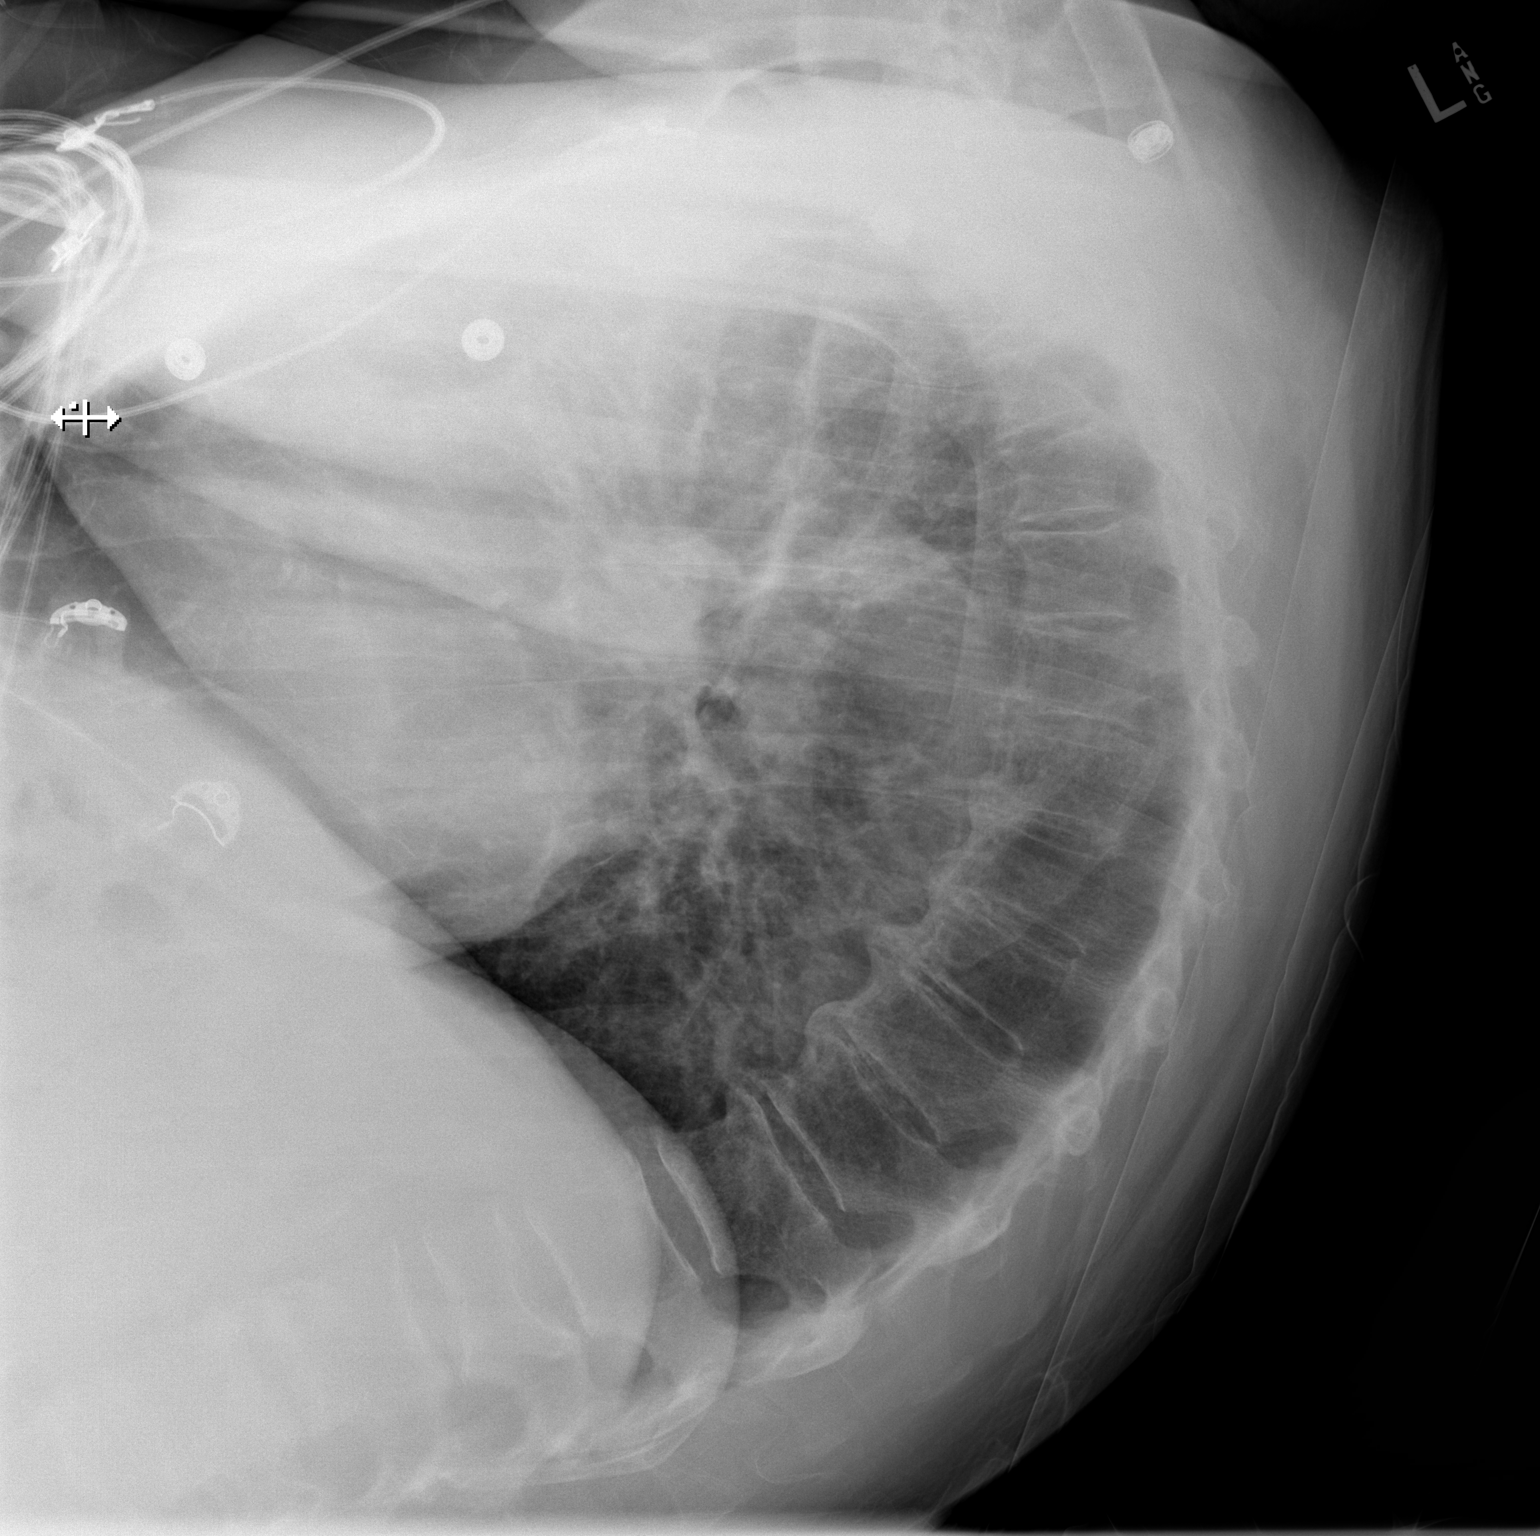

[x chest ap]
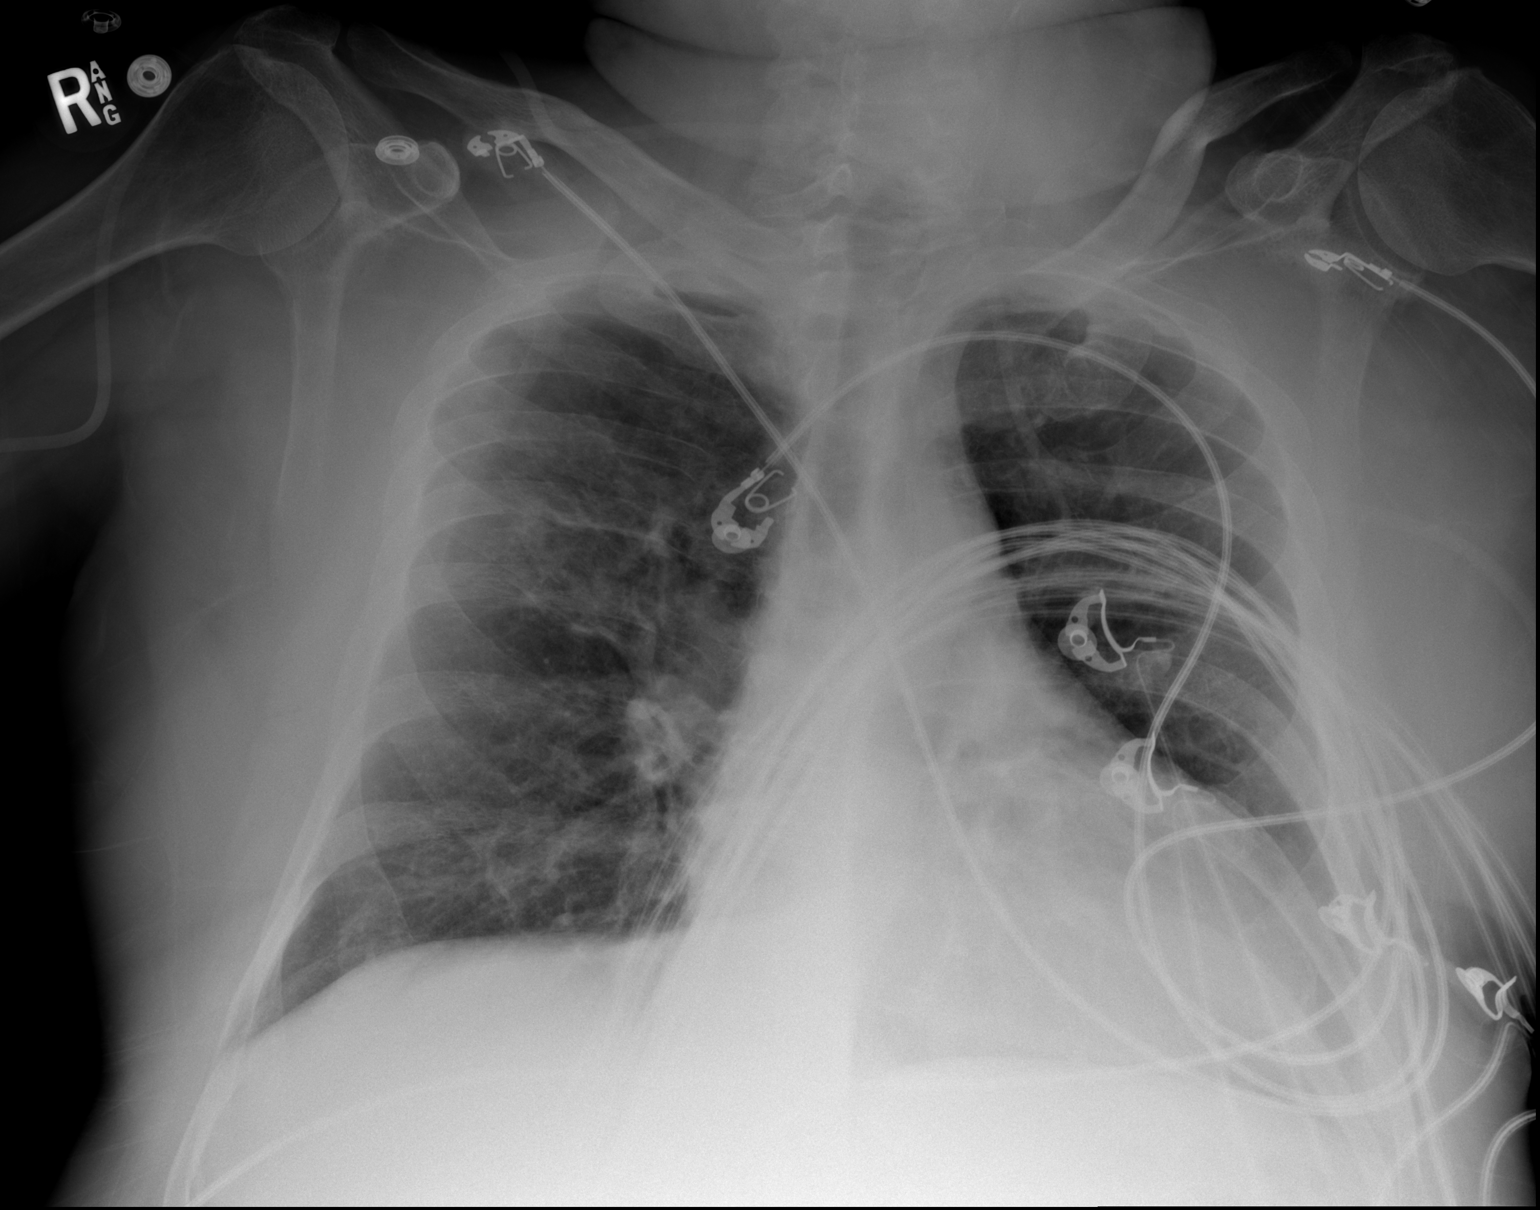

[2 of 2 positions shown; findings below may reference images not displayed]

FINDINGS: Cardiac shadow is at the upper limits of normal in size. The lungs
are well aerated bilaterally. Patchy density is again seen in the
right upper lobe. Degenerative changes of the thoracic spine are
noted.
IMPRESSION: Patchy density in the right upper lobe. Given its persistence from
the prior exam this may simply represent scarring although CT of the
chest is recommended for further evaluation.

## 2020-07-25 ENCOUNTER — Telehealth: Payer: Self-pay | Admitting: *Deleted

## 2020-07-25 NOTE — Chronic Care Management (AMB) (Signed)
  Care Management   Note  07/25/2020 Name: LEILAND MIHELICH MRN: [id number] DOB: 10-19-1950  AMILIO ZEHNDER is a 70 y.o. year old male who is a primary care patient of Chambliss, Estill Batten, MD and is actively engaged with the care management team. I reached out to [name] by phone today to assist with scheduling a follow up visit with the Licensed Clinical Social Worker  Follow up plan: Unsuccessful telephone outreach attempt made. A HIPAA compliant phone message was left for the patient providing contact information and requesting a return call.  The care management team will reach out to the patient again over the next 2 days.  If patient returns call to provider office, please advise to call Embedded Care Management Care Guide Gwenevere Ghazi at [phone]  [facility] Guide, Embedded Care Coordination [facility] Management

## 2020-07-31 NOTE — Chronic Care Management (AMB) (Signed)
  Care Management   Note  07/31/2020 Name: Juan [name] MRN: [id number] DOB: 22-Oct-1950  Juan [name] is a 70 y.o. year old male who is a primary care patient of Chambliss, Estill Batten, MD and is actively engaged with the care management team. I reached out to [name] by phone today to assist with re-scheduling a follow up visit with the Licensed Clinical Social Worker  Follow up plan: A second unsuccessful telephone outreach attempt made. A HIPAA compliant phone message was left for the patient providing contact information and requesting a return call. The care management team will reach out to the patient again over the next 7 days. If patient returns call to provider office, please advise to call Embedded Care Management Care Guide Gwenevere Ghazi at [phone].  Gwenevere Ghazi  Care Guide, Embedded Care Coordination [facility] Management

## 2020-08-02 NOTE — Chronic Care Management (AMB) (Signed)
  Care Management   Note  08/02/2020 Name: Juan [name] MRN: [id number] DOB: 1950-04-21  Juan [name] is a 70 y.o. year old male who is a primary care patient of Chambliss, Estill Batten, MD and is actively engaged with the care management team. I reached out to [name] by phone today to assist with scheduling a follow up visit with the Licensed Clinical Social Worker  Follow up plan: Third unsuccessful telephone outreach attempt made. A HIPAA compliant phone message was left for the patient providing contact information and requesting a return call. We have been unable to make contact with the patient for follow up. The care management team is available to follow up with the patient after provider conversation with the patient regarding recommendation for care management engagement and subsequent re-referral to the care management team. If patient returns call to provider office, please advise to call Embedded Care Management Care Guide Gwenevere Ghazi at [phone].   Gwenevere Ghazi  Care Guide, Embedded Care Coordination [facility] Management

## 2020-08-08 ENCOUNTER — Telehealth: Payer: Self-pay | Admitting: Licensed Clinical Social Worker

## 2020-08-08 NOTE — Chronic Care Management (AMB) (Signed)
    Clinical Social Work  Care Management   Phone Outreach    08/08/2020 Name: BRONTE KROPF MRN: [id number] DOB: [birth year]  VIYAAN CHAMPINE is a 70 y.o. year old male who is a primary care patient of Chambliss, Estill Batten, MD .   F/U phone call today to assess needs, and progress with care plan goals.   3rd unsuccessful telephone outreach was attempted today. The patient was referred to the case management team for assistance with care management and care coordination.   The patient's primary care provider has been notified of our unsuccessful attempts to make or maintain contact with the patient. The care management team is pleased to engage with this patient at any time in the future should he be interested in assistance from the care management team.   Plan:CCM LCSW will wait for return call. If no return call is received, Will disconnect from care team in the next 3 weeks .   Review of patient status, including review of consultants reports, relevant laboratory and other test results, and collaboration with appropriate care team members and the patient's provider was performed as part of comprehensive patient evaluation and provision of care management services.     Sammuel Hines, LCSW Care Management & Coordination  [facility] / [facility]   [phone] 11:10 AM

## 2020-08-16 ENCOUNTER — Other Ambulatory Visit: Payer: Self-pay

## 2020-08-16 ENCOUNTER — Ambulatory Visit (INDEPENDENT_AMBULATORY_CARE_PROVIDER_SITE_OTHER): Payer: Medicare HMO | Admitting: Family Medicine

## 2020-08-16 ENCOUNTER — Encounter: Payer: Self-pay | Admitting: Family Medicine

## 2020-08-16 DIAGNOSIS — F4321 Adjustment disorder with depressed mood: Secondary | ICD-10-CM

## 2020-08-16 DIAGNOSIS — L608 Other nail disorders: Secondary | ICD-10-CM | POA: Diagnosis not present

## 2020-08-16 DIAGNOSIS — L539 Erythematous condition, unspecified: Secondary | ICD-10-CM | POA: Insufficient documentation

## 2020-08-16 DIAGNOSIS — F1020 Alcohol dependence, uncomplicated: Secondary | ICD-10-CM | POA: Diagnosis not present

## 2020-08-16 DIAGNOSIS — R69 Illness, unspecified: Secondary | ICD-10-CM | POA: Diagnosis not present

## 2020-08-16 NOTE — Patient Instructions (Signed)
Good to see you today - Thank you for coming in  Things we discussed today:  Toe - I put in a referral to podiatry.  If you dont' hear from them in 2 weeks let me know.  If any increasing redness or any pain then call me   Medications - take the same time every day  Beer - cut down to no more than one per day  Smoking - continue to cut down - goal is to stop completely  The most important thing is to follow up with psychiatry   Please always bring your medication bottles  Come back to see me in 3 months or Sooner

## 2020-08-16 NOTE — Progress Notes (Signed)
      SUBJECTIVE:  Accompanied by his wife.  She was out of town a few days 2 weeks ago and feels he has not done well since.  Suspects did not take his medications regulalry  Alcohol Still drinking about 4-5 beers a day.  He would like to cut down more. Reports some few days he did not drink any.   Tobacco Smokes 1 ppd.  Wife supplies Using nicorette to try not to smoke Was down to 1/2 ppd but recently went back up  Adjustment Disorder Rarely leaves the house.  Had an appointment with [facility] psychiatry on June 21.  They decided not to go since could not understand them when they called.  Decided, today, to go back to [facility] psychiatry    Red Toe Noticed second L toe is red.  No definite trauma or pain or fever or worsening redness   Continue to take all medications on his list. Did not bring in    PERTINENT  PMH / PSH: chronic diarrhea, mostly WC bound  OBJECTIVE:   BP (!) 152/93   Pulse 95   Wt 206 lb 8 oz (93.7 kg)   SpO2 97%   BMI 33.33 kg/m   Alert communicative L second toe is diffusely red, nontender without discharge or streaking .  Severe overgrowth of all his toenails  ASSESSMENT/PLAN:   Toe erythema Most consistent with rubbing as cause.  Nontender so doubt cellulitis.  Recommend podiatry referral for general foot care and monitor for signs of infection   Adjustment disorder with depressed mood Severe.  Encouraged follow up with psychiatry ASAP.  This seems the key to improving his many other problems   Alcohol use disorder, severe, dependence (HCC) Stable.  Encouraged to continue to cut down and for a discussion between he and his wife about her bringing alcohol and cigarettes into the home as she does all his shopping      Carney Living, MD [facility]

## 2020-08-16 NOTE — Assessment & Plan Note (Signed)
Severe overgrowth.  Will refer to podiatry

## 2020-08-16 NOTE — Assessment & Plan Note (Signed)
Most consistent with rubbing as cause.  Nontender so doubt cellulitis.  Recommend podiatry referral for general foot care and monitor for signs of infection

## 2020-08-16 NOTE — Assessment & Plan Note (Signed)
Stable.  Encouraged to continue to cut down and for a discussion between he and his wife about her bringing alcohol and cigarettes into the home as she does all his shopping

## 2020-08-16 NOTE — Assessment & Plan Note (Signed)
Severe.  Encouraged follow up with psychiatry ASAP.  This seems the key to improving his many other problems

## 2020-08-18 ENCOUNTER — Other Ambulatory Visit: Payer: Self-pay | Admitting: Family Medicine

## 2020-08-29 ENCOUNTER — Ambulatory Visit: Payer: Medicare HMO | Admitting: Podiatry

## 2020-09-05 ENCOUNTER — Ambulatory Visit: Payer: Medicare HMO | Admitting: Podiatry

## 2020-09-14 ENCOUNTER — Ambulatory Visit: Payer: Medicare HMO | Admitting: Behavioral Health

## 2020-09-21 ENCOUNTER — Ambulatory Visit: Payer: Medicare HMO | Admitting: Podiatry

## 2020-09-21 ENCOUNTER — Other Ambulatory Visit: Payer: Self-pay

## 2020-09-21 ENCOUNTER — Encounter: Payer: Self-pay | Admitting: Podiatry

## 2020-09-21 DIAGNOSIS — G6289 Other specified polyneuropathies: Secondary | ICD-10-CM

## 2020-09-21 DIAGNOSIS — M79675 Pain in left toe(s): Secondary | ICD-10-CM

## 2020-09-21 DIAGNOSIS — B351 Tinea unguium: Secondary | ICD-10-CM

## 2020-09-21 DIAGNOSIS — M2042 Other hammer toe(s) (acquired), left foot: Secondary | ICD-10-CM

## 2020-09-21 DIAGNOSIS — H35349 Macular cyst, hole, or pseudohole, unspecified eye: Secondary | ICD-10-CM | POA: Insufficient documentation

## 2020-09-21 DIAGNOSIS — M79674 Pain in right toe(s): Secondary | ICD-10-CM

## 2020-09-26 NOTE — Progress Notes (Signed)
  Subjective:  Patient ID: Juan [name], male    DOB: [birth year],  MRN: [id number]  Chief Complaint  Patient presents with   Toe Injury    np-2nd left toe is very red, not painful,not diabetic,routine foot and nail care-req am appt-dr. Pearlean Brownie refer    70 y.o. male presents with the above complaint. History confirmed with patient.   Objective:  Physical Exam: warm, good capillary refill, no trophic changes or ulcerative lesions, and normal DP and PT pulses. Left Foot: dystrophic yellowed discolored nail plates with subungual debris and he has hammertoe deformities and some irritation of the dorsal PIPJ of the left second toe no skin breakdown or ulceration Right Foot: dystrophic yellowed discolored nail plates with subungual debris  Assessment:   1. Pain due to onychomycosis of toenails of both feet   2. Other polyneuropathy      Plan:  Patient was evaluated and treated and all questions answered.  Discussed the etiology and treatment options for the condition in detail with the patient. Educated patient on the topical and oral treatment options for mycotic nails. Recommended debridement of the nails today. Sharp and mechanical debridement performed of all painful and mycotic nails today. Nails debrided in length and thickness using a nail nipper to level of comfort. Discussed treatment options including appropriate shoe gear. Follow up as needed for painful nails.    Return in about 3 months (around 12/22/2020) for RFC.

## 2020-10-06 ENCOUNTER — Other Ambulatory Visit: Payer: Self-pay | Admitting: Family Medicine

## 2020-10-11 ENCOUNTER — Other Ambulatory Visit: Payer: Self-pay | Admitting: Family Medicine

## 2020-10-19 ENCOUNTER — Other Ambulatory Visit: Payer: Self-pay | Admitting: Family Medicine

## 2020-10-24 ENCOUNTER — Other Ambulatory Visit: Payer: Self-pay | Admitting: Family Medicine

## 2020-11-07 ENCOUNTER — Other Ambulatory Visit: Payer: Self-pay | Admitting: Internal Medicine

## 2020-11-23 ENCOUNTER — Other Ambulatory Visit: Payer: Self-pay | Admitting: Internal Medicine

## 2020-12-26 ENCOUNTER — Ambulatory Visit: Payer: Medicare HMO | Admitting: Family Medicine

## 2020-12-27 ENCOUNTER — Ambulatory Visit: Payer: Medicare HMO | Admitting: Podiatry

## 2021-01-05 NOTE — Progress Notes (Deleted)
Cardiology Office Note:    Date:  01/05/2021   ID:  Juan [name], DOB 12/10/1950, MRN [id number]  PCP:  Juan Living, MD  Cardiologist:  Chrystie Nose, MD  Electrophysiologist:  None   Referring MD: Juan [name], *   Chief Complaint: follow-up of CHF  History of Present Illness:    Juan [name] is a 70 y.o. male with a history of non-ischemic cardiomyopathy/ chronic combined CHF with EF as low as 30-35% on last Echo in 10/2019, COPD/asthma, hypertension, hyperlipidemia, anxiety, alcohol abuse who is followed by Dr. [name] and presents today for follow-up of CHF.  Patient has a history of non-ischemic cardiomyopathy presumed to be secondary to alcohol abuse. Initially diagnosed in 2012. During an admission in 11/2012 for alcohol withdrawal complicated with seizures, he was noted to have non-sustained VT on telemetry. Echo that admission shoed LVEF of 30-35%. Akinesis of the basalinferior myocardium noted on Echo report but Dr. Royann Shivers reviewed at the time and did not agree with regional wall motion abnormality. Myoview was ordered and showed no evidence of ischemia. He was started on GDMT. He was also discharged on a Life Vest given NSVT but this was ultimately felt to likely be due to metabolic derangement. EF improved to 60-65% on an Echo in 2015. However, on repeat Echo in 2019, EF had dropped back to 30-35%. He was continuing to drink heavily at that the time and it was felt that his cardiomyopathy would not improve without alcohol cessation. Last Echo in 10/2019 showed LVEF of 30-35% with global hypokinesis, mild LVH, and small pericardial effusion.   Patient was last seen by Dr. [name] for a virtual visit in 02/2020 at which time he was stable from a cardiac standpoint. He was still drinking alcohol at that time.  Patient presents today for follow-up. ***  Nonischemic Cardiomyopathy Chronic Combined CHF Initially diagnosed in 2012. Presumed to be due to alcohol  abuse. Myoview in 2014 showed no ischemia. Last Echo in 10/2019 showed LVEF of 30-35% with global hypokinesis, mild LVH, and small pericardial effusion. He continues to drink and cardiomyopathy likely will not improve without complete cessation. - Appears euvolemic on exam. - Continue Lasix 40mg  daily. - Continue Losartan 50mg  daily. - Continue Toprol-XL 37.5mg  daily. - Discussed importance of daily weights and sodium/fluid restrictions. - Recommended complete cessation of alcohol.  Hypertension BP well controlled. - Continue medications for CHF.  Hyperlipidemia History of hyperlipidemia but not on any medications. Last lipid panel in ***.  Alcohol Abuse Long history of alcohol abuse with prior seizure secondary to alcohol withdrawal. He continues to drink. ***   Past Medical History:  Diagnosis Date   Alcoholism (HCC)    Allergy    seasonal    Anxiety    Asthma    CHF (congestive heart failure) (HCC) 10/2010   ECHO:  EF 40%, Grade II diastolic dysfunction   Chronic diastolic heart failure (HCC)    COPD (chronic obstructive pulmonary disease) (HCC)    COPD exacerbation (HCC)    Depression    Headache    Hyperlipidemia    Hypertension    Lung nodule seen on imaging study 06/15/2013   Needs follow-up in 3 months (early 2017)    RBBB 11/09/2012   Seizures (HCC)    Tremor 07/30/2011   Likely secondary to long-term alcohol abuse. Did discuss how his tremors affecting his alcoholism. We also switching her from albuterol to Atrovent in case this is causing any worsening of  his tremor.    Umbilical hernia 08/06/2012    Past Surgical History:  Procedure Laterality Date   TOENAIL EXCISION Right 1967   Removal of ingrown toenail [Other]    Current Medications: No outpatient medications have been marked as taking for the 01/15/21 encounter (Appointment) with Corrin Parker, PA-C.     Allergies:   Coreg [carvedilol] and Aspartame and phenylalanine   Social History    Socioeconomic History   Marital status: Married    Spouse name: Not on file   Number of children: Not on file   Years of education: Not on file   Highest education level: Not on file  Occupational History   Occupation: Unemployed  Tobacco Use   Smoking status: Every Day    Packs/day: 1.50    Years: 40.00    Pack years: 60.00    Types: Cigarettes   Smokeless tobacco: Never   Tobacco comments:    a little more than 1 ppd (02/17/14)  Vaping Use   Vaping Use: Never used  Substance and Sexual Activity   Alcohol use: Not on file    Comment: 1/2 galloon liquior in 2 days     12/28/2014 wife reports 32oz / day   Drug use: No   Sexual activity: Not Currently  Other Topics Concern   Not on file  Social History Narrative   Lives with wife Juan [name] in [place].  She is very involved in his health care   Social Determinants of Health   Financial Resource Strain: Not on file  Food Insecurity: Not on file  Transportation Needs: Not on file  Physical Activity: Not on file  Stress: Stress Concern Present   Feeling of Stress : Very much  Social Connections: Not on file     Family History: The patient's family history includes Alzheimer's disease in his mother; Diabetes in his mother; Heart disease in his maternal uncle.  ROS:   Please see the history of present illness.    All other systems reviewed and are negative.  EKGs/Labs/Other Studies Reviewed:    The following studies were reviewed today:  Myoview 11/13/2012: Impression: 1.  Attenuation involving the inferior wall and left ventricular  apex without discrete associated wall motion abnormality to suggest  prior myocardial infarction.  2.  No definitive evidence of pharmacologically induced ischemia.  3.  Dilated left ventricle with global hypokinesia.  Ejection  fraction - 39%.  _______________  Echocardiogram 11/01/2019: Impressions:  1. Left ventricular ejection fraction, by estimation, is 30 to 35%. The  left  ventricle has moderately decreased function. The left ventricle  demonstrates global hypokinesis. There is mild concentric left ventricular  hypertrophy. Left ventricular  diastolic parameters are indeterminate.   2. Right ventricular systolic function was not well visualized. The right  ventricular size is not well visualized.   3. Left atrial size was mildly dilated.   4. A small pericardial effusion is present. The pericardial effusion is  circumferential.   5. The mitral valve is normal in structure. Trivial mitral valve  regurgitation. No evidence of mitral stenosis.   6. The aortic valve is tricuspid. Aortic valve regurgitation is not  visualized. No aortic stenosis is present.   7. The inferior vena cava is normal in size with greater than 50%  respiratory variability, suggesting right atrial pressure of 3 mmHg.  EKG:  EKG ordered today. EKG personally reviewed and demonstrates ***.  Recent Labs: 07/12/2020: ALT 31; BUN 11; Creatinine, Ser 0.81; Hemoglobin 15.4; Platelets 280;  Potassium 4.7; Sodium 133; TSH 2.470  Recent Lipid Panel    Component Value Date/Time   CHOL 158 10/18/2019 0846   TRIG 114 10/18/2019 0846   HDL 40 10/18/2019 0846   CHOLHDL 4.0 10/18/2019 0846   CHOLHDL 4.4 02/13/2016 1446   VLDL 13 02/13/2016 1446   LDLCALC 97 10/18/2019 0846   LDLDIRECT 74 07/20/2012 1124    Physical Exam:    Vital Signs: There were no vitals taken for this visit.    Wt Readings from Last 3 Encounters:  08/16/20 206 lb 8 oz (93.7 kg)  07/12/20 202 lb 6.4 oz (91.8 kg)  02/17/20 204 lb (92.5 kg)     General: 70 y.o. male in no acute distress. HEENT: Normocephalic and atraumatic. Sclera clear. EOMs intact. Neck: Supple. No carotid bruits. No JVD. Heart: *** RRR. Distinct S1 and S2. No murmurs, gallops, or rubs. Radial and distal pedal pulses 2+ and equal bilaterally. Lungs: No increased work of breathing. Clear to ausculation bilaterally. No wheezes, rhonchi, or rales.   Abdomen: Soft, non-distended, and non-tender to palpation. Bowel sounds present in all 4 quadrants.  MSK: Normal strength and tone for age. *** Extremities: No lower extremity edema.    Skin: Warm and dry. Neuro: Alert and oriented x3. No focal deficits. Psych: Normal affect. Responds appropriately.   Assessment:    No diagnosis found.  Plan:     Disposition: Follow up in ***   Medication Adjustments/Labs and Tests Ordered: Current medicines are reviewed at length with the patient today.  Concerns regarding medicines are outlined above.  No orders of the defined types were placed in this encounter.  No orders of the defined types were placed in this encounter.   There are no Patient Instructions on file for this visit.   Signed, Corrin Parker, PA-C  01/05/2021 2:20 PM    [facility]

## 2021-01-06 ENCOUNTER — Other Ambulatory Visit: Payer: Self-pay | Admitting: Internal Medicine

## 2021-01-10 ENCOUNTER — Ambulatory Visit: Payer: Medicare HMO | Admitting: Family Medicine

## 2021-01-15 ENCOUNTER — Ambulatory Visit: Payer: Medicare HMO | Admitting: Student

## 2021-01-22 ENCOUNTER — Other Ambulatory Visit: Payer: Self-pay | Admitting: Family Medicine

## 2021-01-23 ENCOUNTER — Ambulatory Visit: Payer: Medicare HMO | Admitting: Family Medicine

## 2021-02-06 ENCOUNTER — Encounter: Payer: Self-pay | Admitting: Family Medicine

## 2021-02-06 ENCOUNTER — Other Ambulatory Visit: Payer: Self-pay

## 2021-02-06 ENCOUNTER — Ambulatory Visit (INDEPENDENT_AMBULATORY_CARE_PROVIDER_SITE_OTHER): Payer: Medicare HMO

## 2021-02-06 ENCOUNTER — Ambulatory Visit (INDEPENDENT_AMBULATORY_CARE_PROVIDER_SITE_OTHER): Payer: Medicare HMO | Admitting: Family Medicine

## 2021-02-06 VITALS — BP 123/73 | HR 87 | Wt 201.2 lb

## 2021-02-06 DIAGNOSIS — F4321 Adjustment disorder with depressed mood: Secondary | ICD-10-CM

## 2021-02-06 DIAGNOSIS — J479 Bronchiectasis, uncomplicated: Secondary | ICD-10-CM | POA: Diagnosis not present

## 2021-02-06 DIAGNOSIS — Z1211 Encounter for screening for malignant neoplasm of colon: Secondary | ICD-10-CM

## 2021-02-06 DIAGNOSIS — Z720 Tobacco use: Secondary | ICD-10-CM

## 2021-02-06 DIAGNOSIS — I10 Essential (primary) hypertension: Secondary | ICD-10-CM | POA: Diagnosis not present

## 2021-02-06 DIAGNOSIS — Z23 Encounter for immunization: Secondary | ICD-10-CM | POA: Diagnosis not present

## 2021-02-06 DIAGNOSIS — R69 Illness, unspecified: Secondary | ICD-10-CM | POA: Diagnosis not present

## 2021-02-06 DIAGNOSIS — I480 Paroxysmal atrial fibrillation: Secondary | ICD-10-CM | POA: Diagnosis not present

## 2021-02-06 DIAGNOSIS — F1020 Alcohol dependence, uncomplicated: Secondary | ICD-10-CM

## 2021-02-06 MED ORDER — CLONAZEPAM 0.5 MG PO TABS: 0.5000 mg | ORAL_TABLET | Freq: Two times a day (BID) | ORAL | 5 refills | Status: DC

## 2021-02-06 NOTE — Assessment & Plan Note (Signed)
Follows with Dr [name].  Has appointment in Feb 2023

## 2021-02-06 NOTE — Assessment & Plan Note (Signed)
Improving by his report.  He does plan to stop completely

## 2021-02-06 NOTE — Assessment & Plan Note (Signed)
>>  ASSESSMENT AND PLAN FOR PAROXYSMAL ATRIAL FIBRILLATION (HCC) WRITTEN ON 02/06/2021  5:41 PM BY Jordyne Poehlman L, MD  Follows with Dr [name].  Has appointment in Feb 2023

## 2021-02-06 NOTE — Patient Instructions (Addendum)
Good to see you today - Thank you for coming in  Things we discussed today:  Mental Health - if things are going ok I will continue to prescribe your current medications.  If you want to work on improvement then I would suggest psychiatry   Continue cutting down on cigarettes as you are.  For health reasons drinking only one beer per day would be healthiest   To screen for colon cancer I have ordered a Cologuard test to be sent to your home.  Please use it and send it in.  If you have questions you can call [phone].      Please always bring your medication bottles  Come back to see me in 6 months

## 2021-02-06 NOTE — Assessment & Plan Note (Signed)
Does not see pulmonary.  Seems stable.  Encouraged continued decreasing smoking

## 2021-02-06 NOTE — Assessment & Plan Note (Signed)
BP Readings from Last 3 Encounters:  02/06/21 123/73  08/16/20 (!) 152/93  07/12/20 (!) 141/86   Controlled today.  Continue current medications

## 2021-02-06 NOTE — Assessment & Plan Note (Signed)
Reports a lower level of alcohol consumption (24 oz beer per day)  He does not plan to stop completely

## 2021-02-06 NOTE — Progress Notes (Signed)
° ° °  SUBJECTIVE:   CHIEF COMPLAINT / HPI:   Wife was NOT present to corroborate history    Alcohol Reports drinking 24 oz beer outside in evenings with his cigs.  On cold days will skip entirely.  He is not interested in cutting down any further.  Takes gabapentin twice a day    Tobacco Reports he is down to 4-5 cigs per day in evenings outsideppd.   Using nicorette to try not to smoke    Adjustment Disorder Is not following with psychiatry Had considered [facility] psychiatry and has seen [facility] in past.  He feels he is doing ok and does not wish to see anyone unless worsening.  Takes effexor and klonopin regularly     PERTINENT  PMH / PSH: Afib paroxysmal,   OBJECTIVE:   BP 123/73    Pulse 87    Wt 201 lb 3.2 oz (91.3 kg)    SpO2 99%    BMI 32.47 kg/m   Psych:  Cognition and judgment appear intact. Alert, communicative  and cooperative with normal attention span and concentration. No apparent delusions, illusions, hallucinations Heart - predominately Regular rate and rhythm. Rare skipped beat HS distant     Lungs - decreased breath sounds throughout   ASSESSMENT/PLAN:   HTN (hypertension) BP Readings from Last 3 Encounters:  02/06/21 123/73  08/16/20 (!) 152/93  07/12/20 (!) 141/86   Controlled today.  Continue current medications   Paroxysmal atrial fibrillation (HCC) Follows with Dr [name].  Has appointment in Feb 2023  Bronchiectasis without acute exacerbation (HCC) Does not see pulmonary.  Seems stable.  Encouraged continued decreasing smoking   Tobacco abuse Improving by his report.  He does plan to stop completely   Alcohol use disorder, severe, dependence (HCC) Reports a lower level of alcohol consumption (24 oz beer per day)  He does not plan to stop completely   Adjustment disorder with depressed mood He feels things are stable and does not wish further treatment.  Continue current SSRI and low dose Klonopin.  May discuss weaning in the  future      Carney Living, MD [facility]

## 2021-02-06 NOTE — Assessment & Plan Note (Signed)
He feels things are stable and does not wish further treatment.  Continue current SSRI and low dose Klonopin.  May discuss weaning in the future

## 2021-02-09 ENCOUNTER — Other Ambulatory Visit: Payer: Self-pay | Admitting: Family Medicine

## 2021-03-14 ENCOUNTER — Other Ambulatory Visit: Payer: Self-pay | Admitting: Family Medicine

## 2021-03-15 ENCOUNTER — Ambulatory Visit: Payer: Medicare HMO | Admitting: Physician Assistant

## 2021-05-02 ENCOUNTER — Ambulatory Visit: Payer: Medicare HMO | Admitting: Physician Assistant

## 2021-05-31 ENCOUNTER — Encounter: Payer: Self-pay | Admitting: Family Medicine

## 2021-06-01 ENCOUNTER — Ambulatory Visit: Payer: Medicare HMO | Admitting: Physician Assistant

## 2021-06-07 ENCOUNTER — Telehealth: Payer: Self-pay | Admitting: Family Medicine

## 2021-06-07 NOTE — Telephone Encounter (Signed)
Spoke w wife regarding his condition ? ?Stopped smoking ?Down to 2-4 beers per day ?Canceled Cardiology appointment x 4 ?Refuses to go to Pulmonology or Psychiatry ?Has extreme anxiety about medical appts ?Never leaves house except medication appts ?Only bathes when leaves house  ?Sleeps excessively to goes long periods without sleep ?Not using nebs or oxygen ?Have to repeat things to him frequently and simplify  ?Does not read anymore only watches movies ? ? ?Decided ?She is doing all she can and needs to care for herself first ?I will plan not to refill his clonazepam until has an office visit and we can discuss these issues further ?There is a risk he will refuse and will dramatically worsen.  That is often a natural course of his disease and he may have to worsen before has a chance of improvement ? ?She will keep in touch ? ? ? ? ?

## 2021-07-10 ENCOUNTER — Encounter: Payer: Self-pay | Admitting: *Deleted

## 2021-08-10 ENCOUNTER — Other Ambulatory Visit: Payer: Self-pay | Admitting: Family Medicine

## 2021-08-10 ENCOUNTER — Encounter: Payer: Self-pay | Admitting: Family Medicine

## 2021-08-28 ENCOUNTER — Other Ambulatory Visit: Payer: Self-pay | Admitting: Family Medicine

## 2021-09-19 ENCOUNTER — Ambulatory Visit: Payer: Medicare HMO | Admitting: Family Medicine

## 2021-10-16 ENCOUNTER — Ambulatory Visit: Payer: Medicare HMO | Admitting: Family Medicine

## 2021-10-17 ENCOUNTER — Other Ambulatory Visit: Payer: Self-pay | Admitting: Family Medicine

## 2021-10-30 ENCOUNTER — Ambulatory Visit (INDEPENDENT_AMBULATORY_CARE_PROVIDER_SITE_OTHER): Payer: Medicare HMO | Admitting: Family Medicine

## 2021-10-30 ENCOUNTER — Encounter: Payer: Self-pay | Admitting: Family Medicine

## 2021-10-30 VITALS — BP 109/70 | HR 83 | Wt 204.4 lb

## 2021-10-30 DIAGNOSIS — F1020 Alcohol dependence, uncomplicated: Secondary | ICD-10-CM | POA: Diagnosis not present

## 2021-10-30 DIAGNOSIS — Z23 Encounter for immunization: Secondary | ICD-10-CM | POA: Diagnosis not present

## 2021-10-30 DIAGNOSIS — Z7409 Other reduced mobility: Secondary | ICD-10-CM

## 2021-10-30 DIAGNOSIS — I5022 Chronic systolic (congestive) heart failure: Secondary | ICD-10-CM | POA: Diagnosis not present

## 2021-10-30 DIAGNOSIS — I10 Essential (primary) hypertension: Secondary | ICD-10-CM

## 2021-10-30 DIAGNOSIS — Z720 Tobacco use: Secondary | ICD-10-CM | POA: Diagnosis not present

## 2021-10-30 DIAGNOSIS — J479 Bronchiectasis, uncomplicated: Secondary | ICD-10-CM | POA: Diagnosis not present

## 2021-10-30 DIAGNOSIS — R69 Illness, unspecified: Secondary | ICD-10-CM | POA: Diagnosis not present

## 2021-10-30 MED ORDER — CLONAZEPAM 0.5 MG PO TABS: 0.5000 mg | ORAL_TABLET | Freq: Two times a day (BID) | ORAL | 3 refills | Status: DC

## 2021-10-30 MED ORDER — LOSARTAN POTASSIUM 50 MG PO TABS: 50.0000 mg | ORAL_TABLET | Freq: Every day | ORAL | 3 refills | Status: DC

## 2021-10-30 MED ORDER — ZOSTER VAC RECOMB ADJUVANTED 50 MCG/0.5ML IM SUSR: INTRAMUSCULAR | 1 refills | Status: DC

## 2021-10-30 NOTE — Patient Instructions (Signed)
Good to see you today - Thank you for coming in  Things we discussed today:  Breathing Consider an appointment with a pulmonologist I will send in an prescription for a preventer medication once I look at your PF  Anxiety Will continue current medications Try to skip the second dose every so often  Beer Consider cutting down to one 16 oz and one 12 oz beers per day  Lasix Try cutting down to 1/2 of a 40 mg tablet.  Let me know if more swelling or trouble breathing  Colon Cancer screen - send them in  Please always bring your medication bottles  Come back to see me in 6 months

## 2021-10-30 NOTE — Progress Notes (Unsigned)
    SUBJECTIVE:   CHIEF COMPLAINT / HPI:   Last visit Jan 2023   HTN (hypertension) Did not bring in his medications.  Reports taking losartan.  Does not routinely measure blood pressure  He would like to decrease his lasix since has hard time getting to BR rapidly   Paroxysmal atrial fibrillation ([place]) Follows with Dr Debara Pickett.  Had virtual appointment Jan 2022.     Bronchiectasis without acute exacerbation ([place]) Is only using primatine mist and combivent.  Stopped nebs some time ago.  Feels his breathing is ok sometime but does have shortness of breath with much exertion.  Has never seen pulmonary  Tobacco abuse Reports has stopped completely several months ago!   Alcohol use disorder, severe, dependence (HCC) Reports 32 oz beer per day - two 16 oz cans  He does not plan to stop completely    Adjustment disorder with depressed mood He feels things are stable and does not wish further treatment.  Continue current SSRI and low dose Klonopin.  He ackowledges he may be interested in decreasing klnopin.    PERTINENT  PMH / PSH: accompanied by his wife who is concerned with his breathing  OBJECTIVE:   BP 109/70   Pulse 83   Wt 204 lb 6.4 oz (92.7 kg)   SpO2 93%   BMI 32.99 kg/m   In wheelchair 1+ pitting edema with significant dryness of lower extremity  Lungs - decreased sounds at base with poor deep breaths.  No wheeze Heart - Distant Regular rate and rhythm.  No murmurs, gallops or rubs.      ASSESSMENT/PLAN:   Chronic systolic heart failure (HCC) Seems stable clinically is not on all possible therapies.  Recommend he follow up with cardiology.  Will decrease his lasix due to mobility issues and see if he notices any worsening  HTN (hypertension) At goal today.  He has limited ambulation due to multiple factors so hard to know if any hypotension   Alcohol use disorder, severe, dependence ([place]) By report stable.  Discussed approach to cut down further   Tobacco  abuse Reports and wife confirms is smoke free - congratulated him.  A good prognostic factor  Bronchiectasis without acute exacerbation (HCC) Very likely has COPD component.  Strongly encouraged to see pulmonary.  In meantime since currently only using combivent will start inhaled steroid to hopefully prevent exacerbations   Mobility impaired I share his wifes concern.  He is not currrently interested in Physical Therapy or further evaluation    Patient Instructions  Good to see you today - Thank you for coming in  Things we discussed today:  Breathing Consider an appointment with a pulmonologist I will send in an prescription for a preventer medication once I look at your PF  Anxiety Will continue current medications Try to skip the second dose every so often  Beer Consider cutting down to one 16 oz and one 12 oz beers per day  Lasix Try cutting down to 1/2 of a 40 mg tablet.  Let me know if more swelling or trouble breathing  Colon Cancer screen - send them in  Please always bring your medication bottles  Come back to see me in 6 months   Lind Covert, [place]

## 2021-10-31 DIAGNOSIS — Z7409 Other reduced mobility: Secondary | ICD-10-CM | POA: Insufficient documentation

## 2021-10-31 LAB — CMP14+EGFR
ALT: 20 IU/L (ref 0–44)
AST: 20 IU/L (ref 0–40)
Albumin/Globulin Ratio: 1.3 (ref 1.2–2.2)
Albumin: 4.1 g/dL (ref 3.9–4.9)
Alkaline Phosphatase: 85 IU/L (ref 44–121)
BUN/Creatinine Ratio: 17 (ref 10–24)
BUN: 16 mg/dL (ref 8–27)
Bilirubin Total: 0.4 mg/dL (ref 0.0–1.2)
CO2: 23 mmol/L (ref 20–29)
Calcium: 9.4 mg/dL (ref 8.6–10.2)
Chloride: 95 mmol/L — ABNORMAL LOW (ref 96–106)
Creatinine, Ser: 0.96 mg/dL (ref 0.76–1.27)
Globulin, Total: 3.1 g/dL (ref 1.5–4.5)
Glucose: 136 mg/dL — ABNORMAL HIGH (ref 70–99)
Potassium: 4.6 mmol/L (ref 3.5–5.2)
Sodium: 135 mmol/L (ref 134–144)
Total Protein: 7.2 g/dL (ref 6.0–8.5)
eGFR: 86 mL/min/{1.73_m2} (ref >59)

## 2021-10-31 MED ORDER — FLUTICASONE PROPIONATE HFA 220 MCG/ACT IN AERO: 1.0000 | INHALATION_SPRAY | Freq: Two times a day (BID) | RESPIRATORY_TRACT | 12 refills | Status: DC

## 2021-10-31 NOTE — Assessment & Plan Note (Signed)
I share his wifes concern.  He is not currrently interested in Physical Therapy or further evaluation

## 2021-10-31 NOTE — Assessment & Plan Note (Signed)
Very likely has COPD component.  Strongly encouraged to see pulmonary.  In meantime since currently only using combivent will start inhaled steroid to hopefully prevent exacerbations

## 2021-10-31 NOTE — Assessment & Plan Note (Addendum)
At goal today.  He has limited ambulation due to multiple factors so hard to know if any hypotension

## 2021-10-31 NOTE — Assessment & Plan Note (Signed)
Seems stable clinically is not on all possible therapies.  Recommend he follow up with cardiology.  Will decrease his lasix due to mobility issues and see if he notices any worsening

## 2021-10-31 NOTE — Assessment & Plan Note (Signed)
By report stable.  Discussed approach to cut down further

## 2021-10-31 NOTE — Assessment & Plan Note (Signed)
Reports and wife confirms is smoke free - congratulated him.  A good prognostic factor

## 2021-12-06 ENCOUNTER — Telehealth: Payer: Self-pay

## 2021-12-06 NOTE — Patient Outreach (Signed)
  Care Coordination   12/06/2021 Name: Juan [name] MRN: [id number] DOB: 04-Oct-1950   Care Coordination Outreach Attempts:  An unsuccessful telephone outreach was attempted today to offer the patient information about available care coordination services as a benefit of their health plan.   Follow Up Plan:  Additional outreach attempts will be made to offer the patient care coordination information and services.   Encounter Outcome:  No Answer  Care Coordination Interventions Activated:  No   Care Coordination Interventions:  No, not indicated    Johnney Killian, RN, BSN, CCM Care Management Coordinator [facility] Phone: [phone]: [phone]

## 2021-12-21 ENCOUNTER — Other Ambulatory Visit: Payer: Self-pay | Admitting: Internal Medicine

## 2022-01-24 ENCOUNTER — Telehealth: Payer: Self-pay | Admitting: Family Medicine

## 2022-01-24 NOTE — Telephone Encounter (Signed)
Left message for patient to call back and schedule Medicare Annual Wellness Visit (AWV) either virtually or phone  Left  my Juan [name] number [phone]   awvi 02/04/18 per [facility]  please schedule with Nurse Health Adviser   45 min for awv-i and in office appointments 30 min for awv-s  phone/virtual appointments

## 2022-02-15 ENCOUNTER — Encounter: Payer: Self-pay | Admitting: Family Medicine

## 2022-02-15 MED ORDER — VENLAFAXINE HCL ER 150 MG PO CP24: 150.0000 mg | ORAL_CAPSULE | Freq: Every day | ORAL | 1 refills | Status: DC

## 2022-02-18 ENCOUNTER — Encounter: Payer: Self-pay | Admitting: Family Medicine

## 2022-02-18 ENCOUNTER — Ambulatory Visit: Payer: Medicare HMO

## 2022-02-18 ENCOUNTER — Ambulatory Visit (INDEPENDENT_AMBULATORY_CARE_PROVIDER_SITE_OTHER): Payer: Medicare HMO | Admitting: Family Medicine

## 2022-02-18 VITALS — BP 150/72 | HR 104 | Ht 66.0 in | Wt 214.0 lb

## 2022-02-18 DIAGNOSIS — E119 Type 2 diabetes mellitus without complications: Secondary | ICD-10-CM | POA: Diagnosis not present

## 2022-02-18 DIAGNOSIS — R7309 Other abnormal glucose: Secondary | ICD-10-CM | POA: Diagnosis not present

## 2022-02-18 DIAGNOSIS — I872 Venous insufficiency (chronic) (peripheral): Secondary | ICD-10-CM

## 2022-02-18 DIAGNOSIS — L97221 Non-pressure chronic ulcer of left calf limited to breakdown of skin: Secondary | ICD-10-CM

## 2022-02-18 LAB — POCT GLYCOSYLATED HEMOGLOBIN (HGB A1C): HbA1c, POC (controlled diabetic range): 7.3 % — AB (ref 0.0–7.0)

## 2022-02-18 NOTE — Assessment & Plan Note (Addendum)
Previous BMPs elevated blood glucose to 130s.  No recent A1c. Given patients decreased lower extremity sensation, concern for unknown new diabetes. Will check A1c today.  Update: A1c return 7.3. Will discuss medical management at follow-up on Thursday.

## 2022-02-18 NOTE — Progress Notes (Deleted)
    SUBJECTIVE:   CHIEF COMPLAINT / HPI: Sore on leg    PERTINENT  PMH / PSH: chronic systolic HF, paroxysmal A-fib, hypertension  OBJECTIVE:   There were no vitals taken for this visit.  ***  ASSESSMENT/PLAN:   No problem-specific Assessment & Plan notes found for this encounter.     Gerrit Heck, MD [place]

## 2022-02-18 NOTE — Assessment & Plan Note (Addendum)
Patient's vitals are stable.  No systemic signs of infection.  Wound is erythematous, however surrounding skin not warm or inflamed.  No malodorous discharge.  Consistent with venous stasis ulcer, not currently infected.  Given negative Juan [name]' sign and subacute timeline in setting of chronic lower extremity edema I have low concern for DVT.  Patient does have significant risk factors being wheelchair-bound and atrial fibrillation. Placed Juan [name] today.  Follow-up 3 days for removal.  Wound care referral placed.  Discussed increasing multivitamin intake to twice a day for wound healing and continued alcohol cessation.  Patient verbalized understanding.  Strict return precautions discussed.

## 2022-02-18 NOTE — Patient Instructions (Addendum)
It was great to see you! Thank you for allowing me to participate in your care!  I recommend that you always bring your medications to each appointment as this makes it easy to ensure we are on the correct medications and helps [place] not miss when refills are needed.  Our plans for today:  - I have placed a Unna boot to help with your wound healing. Please return on Thursday to have it removed. - I have placed a wound care referral, they should call to schedule. - I will check labs on Thursday. - Please take your multivitamin twice daily.   Please arrive 15 minutes PRIOR to your next scheduled appointment time! If you do not, this affects OTHER patients' care.  Take care and seek immediate care sooner if you develop any concerns.   Dr. Salvadore Oxford, MD [place]

## 2022-02-18 NOTE — Progress Notes (Addendum)
    SUBJECTIVE:   CHIEF COMPLAINT / HPI: sore spot on leg  Wound on leg-started on a month ago with small now large taking up most of left side of left leg.  Has had some drainage.  Patient is not feeling pain.  Denies fevers at home.  Came in today because wife is concerned about it.  PERTINENT  PMH / PSH: CHF, HTN, Afib, Bronchiectasis, Alcohol use disorder  OBJECTIVE:   BP (!) 150/72   Pulse (!) 104   Ht 5\' 6"  (1.676 m)   Wt 214 lb (97.1 kg)   SpO2 97%   BMI 34.54 kg/m   General: Sitting comfortably in wheelchair, no acute distress Respiratory: slightly increased work of breathing on room air Left leg: 2+ pitting edema, negative Homans' sign, palpable pulses, decreased sensation    ASSESSMENT/PLAN:   Venous stasis ulcer of left calf limited to breakdown of skin without varicose veins (HCC) Assessment & Plan: Patient's vitals are stable.  No systemic signs of infection.  Wound is erythematous, however surrounding skin not warm or inflamed.  No malodorous discharge.  Consistent with venous stasis ulcer, not currently infected.  Given negative Bevelyn Buckles' sign and subacute timeline in setting of chronic lower extremity edema I have low concern for DVT.  Patient does have significant risk factors being wheelchair-bound and atrial fibrillation. Placed Haematologist today.  Follow-up 3 days for removal.  Wound care referral placed.  Discussed increasing multivitamin intake to twice a day for wound healing and continued alcohol cessation.  Patient verbalized understanding.  Strict return precautions discussed.  Orders: -     AMB referral to wound care center  Elevated random blood glucose level Assessment & Plan: Previous BMPs elevated blood glucose to 130s.  No recent A1c. Given patients decreased lower extremity sensation, concern for unknown new diabetes. Will check A1c today.  Update: A1c return 7.3. Will discuss medical management at follow-up on Thursday.  Orders: -     POCT  glycosylated hemoglobin (Hb A1C)  New onset type 2 diabetes mellitus ([place]) Assessment & Plan: Previous BMPs elevated blood glucose to 130s.  No recent A1c. Given patients decreased lower extremity sensation, concern for unknown new diabetes. Will check A1c today.  Update: A1c return 7.3. Will discuss medical management at follow-up on Thursday.    Return in about 3 days (around 02/21/2022).  Salvadore Oxford, MD [place]

## 2022-02-19 ENCOUNTER — Telehealth: Payer: Self-pay | Admitting: Family Medicine

## 2022-02-19 NOTE — Telephone Encounter (Signed)
Left message for patient to call back and schedule Medicare Annual Wellness Visit (AWV) in office.   If not able to come in office, please offer to do virtually or by telephone.  Left office number and my jabber [phone].  AWVI eligible as of: 02/04/2018   Please schedule at anytime with Nurse Health Advisor.

## 2022-02-20 ENCOUNTER — Other Ambulatory Visit: Payer: Self-pay | Admitting: Family Medicine

## 2022-02-21 ENCOUNTER — Ambulatory Visit (INDEPENDENT_AMBULATORY_CARE_PROVIDER_SITE_OTHER): Payer: Medicare HMO | Admitting: Family Medicine

## 2022-02-21 ENCOUNTER — Encounter: Payer: Self-pay | Admitting: Family Medicine

## 2022-02-21 VITALS — BP 110/66 | HR 60 | Temp 98.6°F

## 2022-02-21 DIAGNOSIS — E119 Type 2 diabetes mellitus without complications: Secondary | ICD-10-CM

## 2022-02-21 DIAGNOSIS — J479 Bronchiectasis, uncomplicated: Secondary | ICD-10-CM | POA: Diagnosis not present

## 2022-02-21 DIAGNOSIS — L97221 Non-pressure chronic ulcer of left calf limited to breakdown of skin: Secondary | ICD-10-CM

## 2022-02-21 DIAGNOSIS — I5022 Chronic systolic (congestive) heart failure: Secondary | ICD-10-CM | POA: Diagnosis not present

## 2022-02-21 DIAGNOSIS — I480 Paroxysmal atrial fibrillation: Secondary | ICD-10-CM

## 2022-02-21 DIAGNOSIS — I872 Venous insufficiency (chronic) (peripheral): Secondary | ICD-10-CM | POA: Diagnosis not present

## 2022-02-21 MED ORDER — COMBIVENT RESPIMAT 20-100 MCG/ACT IN AERS: INHALATION_SPRAY | RESPIRATORY_TRACT | 2 refills | Status: DC

## 2022-02-21 MED ORDER — METFORMIN HCL ER 500 MG PO TB24: 500.0000 mg | ORAL_TABLET | Freq: Every day | ORAL | 3 refills | Status: DC

## 2022-02-21 NOTE — Patient Instructions (Addendum)
It was great to see you! Thank you for allowing me to participate in your care!  I recommend that you always bring your medications to each appointment as this makes it easy to ensure we are on the correct medications and helps [place] not miss when refills are needed.  Our plans for today:  -I placed a referral for cardiology please make an appointment as soon as possible to discuss your atrial fibrillation. -I have ordered metformin to your pharmacy you will take this once daily at breakfast. -Please make a follow-up appointment at the front d esk for next week for the Unna boot change. -I have made an appointment with your primary care provider Dr. Erin Hearing for Tuesday, January 30  We are checking some labs today, I will call you if they are abnormal will send you a MyChart message or a letter if they are normal.  If you do not hear about your labs in the next 2 weeks please let us know.  Please arrive 15 minutes PRIOR to your next scheduled appointment time! If you do not, this affects OTHER patients' care.  Take care and seek immediate care sooner if you develop any concerns.   Dr. Salvadore Oxford, MD [place]

## 2022-02-21 NOTE — Assessment & Plan Note (Signed)
A1c 7.3 this week. Discussed diagnosis and health implications with patient and wife. Discussed starting Metformin at lowest dose today. Plan for further diabetes education on how and when to check sugars at patient's next appointment. Provided diabetes education packet. Consider diabetes nutrition referral.

## 2022-02-21 NOTE — Assessment & Plan Note (Signed)
>>  ASSESSMENT AND PLAN FOR PAROXYSMAL ATRIAL FIBRILLATION (HCC) WRITTEN ON 02/21/2022 10:21 PM BY Celine Mans, MD  Rated controlled, regular rhythm today. Patient's CHADSVASC score of 5, he is not currently on anticoagulation. Discussed with patient and wife that he should be on blood thinner given patient's high risk for stroke. Will defer to Cardiology in lieu of additional HFrEF. Counseled on patient making appointment with prior Cardiologist. Made referral to Cardiology. Scheduled for follow-up with Dr. Deirdre Priest.

## 2022-02-21 NOTE — Assessment & Plan Note (Addendum)
Rated controlled, regular rhythm today. Patient's CHADSVASC score of 5, he is not currently on anticoagulation. Discussed with patient and wife that he should be on blood thinner given patient's high risk for stroke. Will defer to Cardiology in lieu of additional HFrEF. Counseled on patient making appointment with prior Cardiologist. Made referral to Cardiology. Scheduled for follow-up with Dr. Erin Hearing.

## 2022-02-21 NOTE — Progress Notes (Signed)
    SUBJECTIVE:   CHIEF COMPLAINT / HPI: unna boot removal  Diabetes - A1c earlier this week 7.3, from 6.4.  Venous stasis ulcer - Doing better. Has had some itching with Unna boot. No fevers or chills. Feels tired.  A fib - Does not have palpitations. States has had A fib for 10 years and has never been on a blood thinner other than aspirin. Used to see Cardiologist 2 years ago.   PERTINENT  PMH / PSH: CHF, HTN, Afib, Bronchiectasis, Alcohol use disorder   OBJECTIVE:   BP 110/66   Pulse 60   Temp 98.6 F (37 C) (Oral)   SpO2 95%   General: NAD, sitting comfortably in wheelchair Cardiovascular: RRR, no murmurs, peripheral edema 1+ non-pitting Respiratory: normal WOB on RA, CTAB, no wheezes, ronchi or rales Extremities: Moving all 4 extremities equally Derm:      ASSESSMENT/PLAN:   Venous stasis ulcer of left calf limited to breakdown of skin without varicose veins (HCC) Assessment & Plan: Improved today. Size decrease from clinic visit on Monday, signs of healing. Peripheral edema greatly improved. No new signs of infection. Discussed plan to continue Unna boots until patient can see wound care. Per patient's wife, he is scheduled for February 12th. Instructed to follow-up next week for Unna boot changed and wound check.  Orders: -     CBC  New onset type 2 diabetes mellitus ([place]) Assessment & Plan: A1c 7.3 this week. Discussed diagnosis and health implications with patient and wife. Discussed starting Metformin at lowest dose today. Plan for further diabetes education on how and when to check sugars at patient's next appointment. Provided diabetes education packet. Consider diabetes nutrition referral.   Orders: -     Comprehensive metabolic panel -     CBC -     metFORMIN HCl ER; Take 1 tablet (500 mg total) by mouth daily with breakfast.  Dispense: 180 tablet; Refill: 3  Paroxysmal atrial fibrillation (HCC) Assessment & Plan: Rated controlled, regular rhythm  today. Patient's CHADSVASC score of 5, he is not currently on anticoagulation. Discussed with patient and wife that he should be on blood thinner given patient's high risk for stroke. Will defer to Cardiology in lieu of additional HFrEF. Counseled on patient making appointment with prior Cardiologist. Made referral to Cardiology. Scheduled for follow-up with Dr. Erin Hearing.  Orders: -     Ambulatory referral to Cardiology  Chronic systolic heart failure ([place]) -     Ambulatory referral to Cardiology  Bronchiectasis without acute exacerbation (HCC) -     Combivent Respimat; INHALE 1 TO 2 PUFFS BY MOUTH EVERY 4 HOURS AS NEEDED FOR WHEEZE PLEASE PROVIDE 2 INHALERS  Dispense: 2 each; Refill: 2   Return in about 5 days (around 02/26/2022).  Juan Oxford, MD [place]

## 2022-02-21 NOTE — Assessment & Plan Note (Addendum)
Improved today. Size decrease from clinic visit on Monday, signs of healing. Peripheral edema greatly improved. No new signs of infection. Discussed plan to continue Unna boots until patient can see wound care. Per patient's wife, he is scheduled for February 12th. Instructed to follow-up next week for Unna boot changed and wound check.

## 2022-02-22 LAB — COMPREHENSIVE METABOLIC PANEL
ALT: 23 IU/L (ref 0–44)
AST: 23 IU/L (ref 0–40)
Albumin/Globulin Ratio: 1.3 (ref 1.2–2.2)
Albumin: 4.3 g/dL (ref 3.9–4.9)
Alkaline Phosphatase: 90 IU/L (ref 44–121)
BUN/Creatinine Ratio: 19 (ref 10–24)
BUN: 19 mg/dL (ref 8–27)
Bilirubin Total: 0.4 mg/dL (ref 0.0–1.2)
CO2: 24 mmol/L (ref 20–29)
Calcium: 9.7 mg/dL (ref 8.6–10.2)
Chloride: 92 mmol/L — ABNORMAL LOW (ref 96–106)
Creatinine, Ser: 0.99 mg/dL (ref 0.76–1.27)
Globulin, Total: 3.3 g/dL (ref 1.5–4.5)
Glucose: 119 mg/dL — ABNORMAL HIGH (ref 70–99)
Potassium: 4.8 mmol/L (ref 3.5–5.2)
Sodium: 135 mmol/L (ref 134–144)
Total Protein: 7.6 g/dL (ref 6.0–8.5)
eGFR: 82 mL/min/{1.73_m2} (ref >59)

## 2022-02-22 LAB — CBC
Hematocrit: 42.4 % (ref 37.5–51.0)
Hemoglobin: 14.7 g/dL (ref 13.0–17.7)
MCH: 30.1 pg (ref 26.6–33.0)
MCHC: 34.7 g/dL (ref 31.5–35.7)
MCV: 87 fL (ref 79–97)
Platelets: 342 10*3/uL (ref 150–450)
RBC: 4.88 x10E6/uL (ref 4.14–5.80)
RDW: 12.1 % (ref 11.6–15.4)
WBC: 9.5 10*3/uL (ref 3.4–10.8)

## 2022-02-27 ENCOUNTER — Encounter: Payer: Self-pay | Admitting: Student

## 2022-02-27 ENCOUNTER — Ambulatory Visit (INDEPENDENT_AMBULATORY_CARE_PROVIDER_SITE_OTHER): Payer: Medicare HMO | Admitting: Student

## 2022-02-27 VITALS — BP 104/62 | HR 77 | Wt 207.4 lb

## 2022-02-27 DIAGNOSIS — I872 Venous insufficiency (chronic) (peripheral): Secondary | ICD-10-CM | POA: Diagnosis not present

## 2022-02-27 DIAGNOSIS — L97221 Non-pressure chronic ulcer of left calf limited to breakdown of skin: Secondary | ICD-10-CM

## 2022-02-27 NOTE — Patient Instructions (Signed)
It was wonderful to meet you today. Thank you for allowing me to be a part of your care. Below is a short summary of what we discussed at your visit today:  Your wound looks to be improving.  We changed your [employer] today.  I recommend following up in a week to change the Unna boot  Please bring all of your medications to every appointment!  If you have any questions or concerns, please do not hesitate to contact us via phone or MyChart message.   Alen Bleacher, MD [facility]

## 2022-02-27 NOTE — Assessment & Plan Note (Signed)
Patient venous stasis ulcer is much improved and edema is improved as well. Changed is Haematologist today. Has an upcoming appointment with PCP Dr. Erin Hearing.  Plan is to continue treatment with Unna boot until he sees wound care with first appointment date scheduled for 12/12.

## 2022-02-27 NOTE — Progress Notes (Signed)
    SUBJECTIVE:   CHIEF COMPLAINT / HPI:   Patient is a 72 year old male presenting today for follow-up for venous stasis ulcer.  Was seen last week and dressed with unna boot.  Wound care referral already sent with first appointment scheduled for 02/12.  Plan is to continue Unna boot until he is seen by wound care.  Patient's today reports swelling is improved and leg seems to be back to normal size.  He denies any leg edema, fevers or chills.  PERTINENT  PMH / PSH: Reviewed  OBJECTIVE:   BP 104/62   Pulse 77   Wt 207 lb 6.4 oz (94.1 kg)   SpO2 98%   BMI 33.48 kg/m     Physical Exam General: Alert, well appearing, NAD Cardiovascular: RRR, Well perfused Respiratory: Normal work of breathing on room air Left leg: No edema noted other than peripheral erythema around wound. Wound is not draining.       ASSESSMENT/PLAN:   Venous stasis ulcer of left calf limited to breakdown of skin without varicose veins (HCC) Patient venous stasis ulcer is much improved and edema is improved as well. Changed is Haematologist today. Has an upcoming appointment with PCP Dr. Erin Hearing.  Plan is to continue treatment with Unna boot until he sees wound care with first appointment date scheduled for 12/12.     Alen Bleacher, MD [place]

## 2022-03-04 ENCOUNTER — Encounter (HOSPITAL_BASED_OUTPATIENT_CLINIC_OR_DEPARTMENT_OTHER): Payer: Medicare HMO | Attending: Internal Medicine | Admitting: General Surgery

## 2022-03-04 DIAGNOSIS — I110 Hypertensive heart disease with heart failure: Secondary | ICD-10-CM | POA: Insufficient documentation

## 2022-03-04 DIAGNOSIS — L97221 Non-pressure chronic ulcer of left calf limited to breakdown of skin: Secondary | ICD-10-CM | POA: Insufficient documentation

## 2022-03-04 DIAGNOSIS — Z87891 Personal history of nicotine dependence: Secondary | ICD-10-CM | POA: Diagnosis not present

## 2022-03-04 DIAGNOSIS — I5022 Chronic systolic (congestive) heart failure: Secondary | ICD-10-CM | POA: Insufficient documentation

## 2022-03-04 DIAGNOSIS — E11622 Type 2 diabetes mellitus with other skin ulcer: Secondary | ICD-10-CM | POA: Insufficient documentation

## 2022-03-04 DIAGNOSIS — I872 Venous insufficiency (chronic) (peripheral): Secondary | ICD-10-CM | POA: Diagnosis not present

## 2022-03-04 DIAGNOSIS — L97821 Non-pressure chronic ulcer of other part of left lower leg limited to breakdown of skin: Secondary | ICD-10-CM | POA: Diagnosis not present

## 2022-03-04 NOTE — Progress Notes (Unsigned)
    SUBJECTIVE:   CHIEF COMPLAINT / HPI:   Leg Ulcer  Seeing wound care.  He feels is improving.  No fever or discharge.  Currently wrapped by wound care yesterday  Hypertension Did not bring in his medications and can't recite them.  Feels he is taking.     Diabetes Started metformin last week without problems.  Trying to modify his diet  Alcohol  Drinks about 36 ounces of beer and some wine daily.  No change in last 6-12 months.  Would like to cut down  History of Afib He is making an appointment to follow up with cardiology   COPD Stopped smoking a year or so ago.  Not using flovent only combivent with primatene mist sometime.     OBJECTIVE:   BP 114/66   Pulse 89   Ht 5\' 6"  (1.676 m)   Wt 207 lb 6.4 oz (94.1 kg)   SpO2 96%   BMI 33.48 kg/m   Sitting in WC.  Accompanied by his wife H - regular rate with regular skipped beats every 3rd-4th L - decreased breath sounds no active wheeze Legs - L is bandaged. R without edema but dry skin   ASSESSMENT/PLAN:   Paroxysmal atrial fibrillation [facility]) Assessment & Plan: Not in afib in exam.  Encouraged to follow up with cardiology.  May need prolong monitoring.  We discussed the negative effects of alcohol on his heart   Primary hypertension Assessment & Plan: Is at goal today.  Not confident exactly what medications he is taking.     New onset type 2 diabetes mellitus ([place]) Assessment & Plan: Tolerating metformin.  We discussed diet changes - decreasing sweet drinks and beer    Tobacco abuse  Alcohol use disorder, severe, dependence (HCC) Assessment & Plan: Discussed the negative effects on his heart and kidneys and liver and how this is more important than his diabetes treatment.  He will consider cutting down       Patient Instructions  Good to see you today - Thank you for coming in  Things we discussed today:  Diabetes - decrease sweet drinks - Lower carbohydrates - Cut back on alcohol  Heart  Rate - Follow up with your cardiologist   You need an diabetes eye exam every year.  Please see your eye doctor.  Ask them to fax [place] a report of your exam   Please always bring your medication bottles  Come back to see me in 2-3 months    Lind Covert, [facility]

## 2022-03-04 NOTE — Progress Notes (Signed)
KEO, SCHIRMER ([id number]) [id number] Page 1 of 8 Visit Report for 03/04/2022 Allergy List Details Patient Name: Date of Service: BLA Juan [name]. 03/04/2022 12:30 PM Medical Record Number: [id number] Patient Account Number: [id number] Date of Birth/Sex: Treating RN: [birth year] (72 y.o. Male) Adline Peals Primary Care Henriette Hesser: [name] Other Clinician: Referring Elliana Bal: Treating Audley Hinojos/Extender: Adolphus Birchwood Weeks in Treatment: 0 Allergies Active Allergies Coreg aspartame phenylalanine Allergy Notes Electronic Signature(s) Signed: 03/04/2022 4:46:33 PM By: Adline Peals Entered By: Adline Peals on 03/04/2022 12:51:50 -------------------------------------------------------------------------------- Arrival Information Details Patient Name: Date of Service: BLA Juan [name]. 03/04/2022 12:30 PM Medical Record Number: [id number] Patient Account Number: [id number] Date of Birth/Sex: Treating RN: December 19, 1950 (72 y.o. Male) Adline Peals Primary Care Mabry Santarelli: [name] Other Clinician: Referring Armina Galloway: Treating [place] Frieden/Extender: Felipa Eth in Treatment: 0 Visit Information Patient Arrived: Wheel Chair Arrival Time: 12:49 Accompanied By: wife Transfer Assistance: Manual Patient Identification Verified: Yes Secondary Verification Process Completed: Yes Patient Requires Transmission-Based Precautions: No Patient Has Alerts: Yes Patient Alerts: L ABI: [name] in clinic Electronic Signature(s) Signed: 03/04/2022 4:46:33 PM By: Adline Peals Entered By: Adline Peals on 03/04/2022 13:23:18 Carmin Richmond ([id number]) [id number] Page 2 of 8 -------------------------------------------------------------------------------- Compression Therapy Details Patient Name: Date of Service: BLA Juan [name].  03/04/2022 12:30 PM Medical Record Number: [id number] Patient Account Number: [id number] Date of Birth/Sex: Treating RN: July 23, 1950 (72 y.o. Male) Adline Peals Primary Care Devonte Migues: [name] Other Clinician: Referring Danille Oppedisano: Treating Abrie Egloff/Extender: Felipa Eth in Treatment: 0 Compression Therapy Performed for Wound Assessment: Wound #1 Left,Lateral Lower Leg Performed By: Clinician Adline Peals, RN Compression Type: Three Layer Post Procedure Diagnosis Same as Pre-procedure Electronic Signature(s) Signed: 03/04/2022 4:46:33 PM By: Adline Peals Entered By: Adline Peals on 03/04/2022 15:01:59 -------------------------------------------------------------------------------- Encounter Discharge Information Details Patient Name: Date of Service: BLA Juan [name]. 03/04/2022 12:30 PM Medical Record Number: [id number] Patient Account Number: [id number] Date of Birth/Sex: Treating RN: 1950/06/25 (72 y.o. Male) Adline Peals Primary Care Katelyne Galster: [name] Other Clinician: Referring Lydiana Milley: Treating Maeryn Mcgath/Extender: Felipa Eth in Treatment: 0 Encounter Discharge Information Items Post Procedure Vitals Discharge Condition: Stable Temperature (F): 98.2 Ambulatory Status: Wheelchair Pulse (bpm): 67 Discharge Destination: Home Respiratory Rate (breaths/min): 18 Transportation: Private Auto Blood Pressure (mmHg): 146/81 Accompanied By: wife Schedule Follow-up Appointment: Yes Clinical Summary of Care: Patient Declined Electronic Signature(s) Signed: 03/04/2022 4:46:33 PM By: Adline Peals Entered By: Adline Peals on 03/04/2022 15:42:20 -------------------------------------------------------------------------------- Lower Extremity Assessment Details Patient Name: Date of Service: BLA Juan [name]. 03/04/2022 12:30 PM Medical Record Number:  [id number] Patient Account Number: [id number] Date of Birth/Sex: Treating RN: 1950/09/19 (72 y.o. Male) Adline Peals Primary Care Ailany Koren: [name] Other Clinician: Referring Edilia Ghuman: Treating Geraldine Sandberg/Extender: Adolphus Birchwood Weeks in Treatment: 0 Edema Assessment Left: [Left: Right] [Right: :] Assessed: [Left: No] [Right: No] [Left: Edema] [Right: :] Calf Left: Right: Point of Measurement: From Medial Instep 37.5 cm 40 cm Ankle Left: Right: Point of Measurement: From Medial Instep 25.5 cm 26 cm Knee To Floor Left: Right: From Medial Instep 44 cm 44 cm Vascular Assessment Pulses: Dorsalis Pedis Palpable: [Left:Yes Yes] Notes L ABI noncompressible Electronic Signature(s) Signed: 03/04/2022 4:46:33 PM By: Adline Peals Entered By: Adline Peals on 03/04/2022 13:30:29 -------------------------------------------------------------------------------- Multi Wound Chart Details Patient Name: Date of Service: BLA Juan [name]. 03/04/2022 12:30 PM Medical Record Number: [id number] Patient Account Number: [id number] Date of Birth/Sex: Treating RN: 02-11-1950 (72  y.o. Male) Primary Care Christin [place]: [name] Other Clinician: Referring [name]: Treating Bertram Haddix/Extender: Felipa Eth in Treatment: 0 Vital Signs Height(in): 68 Pulse(bpm): 40 Weight(lbs): 210 Blood Pressure(mmHg): 146/81 Body Mass Index(BMI): 31.9 Temperature(F): 98.2 Respiratory Rate(breaths/min): 18 [1:Photos:] [N/A:N/A] Left, Lateral Lower Leg N/A N/A Wound Location: Gradually Appeared N/A N/A Wounding Event: Lymphedema N/A N/A Primary Etiology: Cataracts, Asthma, Chronic N/A N/A Comorbid History: Obstructive Pulmonary Disease (COPD), Arrhythmia, Congestive Heart Failure, Hypertension, Peripheral Venous Disease, Type II Diabetes, REZA, CRYMES ([id number]) [phone].pdf Page 4 of  8 Neuropathy 01/21/2022 N/A N/A Date Acquired: 0 N/A N/A Weeks of Treatment: Open N/A N/A Wound Status: No N/A N/A Wound Recurrence: 13.5x10x0.1 N/A N/A Measurements L x W x D (cm) 106.029 N/A N/A A (cm) : rea 10.603 N/A N/A Volume (cm) : Full Thickness Without Exposed N/A N/A Classification: Support Structures Medium N/A N/A Exudate A mount: Serous N/A N/A Exudate Type: amber N/A N/A Exudate Color: Distinct, outline attached N/A N/A Wound Margin: Large (67-100%) N/A N/A Granulation A mount: Red, Pink N/A N/A Granulation Quality: None Present (0%) N/A N/A Necrotic A mount: Fascia: No N/A N/A Exposed Structures: Fat Layer (Subcutaneous Tissue): No Tendon: No Muscle: No Joint: No Bone: No Limited to Skin Breakdown Small (1-33%) N/A N/A Epithelialization: Debridement - Selective/Open Wound N/A N/A Debridement: Pre-procedure Verification/Time Out 13:27 N/A N/A Taken: Lidocaine 4% Topical Solution N/A N/A Pain Control: Skin/Epidermis N/A N/A Level: 12 N/A N/A Debridement A (sq cm): rea Forceps, Scissors N/A N/A Instrument: Minimum N/A N/A Bleeding: Pressure N/A N/A Hemostasis A chieved: Procedure was tolerated well N/A N/A Debridement Treatment Response: 13.5x10x0.1 N/A N/A Post Debridement Measurements L x W x D (cm) 10.603 N/A N/A Post Debridement Volume: (cm) No Abnormalities Noted N/A N/A Periwound Skin Texture: Dry/Scaly: Yes N/A N/A Periwound Skin Moisture: Rubor: Yes N/A N/A Periwound Skin Color: No Abnormality N/A N/A Temperature: Debridement N/A N/A Procedures Performed: Treatment Notes Electronic Signature(s) Signed: 03/04/2022 2:01:08 PM By: Fredirick Maudlin MD FACS Entered By: Fredirick Maudlin on 03/04/2022 14:01:08 -------------------------------------------------------------------------------- Multi-Disciplinary Care Plan Details Patient Name: Date of Service: BLA Juan [name]. 03/04/2022 12:30 PM Medical Record  Number: [id number] Patient Account Number: [id number] Date of Birth/Sex: Treating RN: 03-07-1950 (72 y.o. Male) Adline Peals Primary Care Lailynn Southgate: [name] Other Clinician: Referring Raffaela Ladley: Treating Kester Stimpson/Extender: Felipa Eth in Treatment: 0 Active Inactive Venous Leg Ulcer Nursing Diagnoses: Knowledge deficit related to disease process and management Potential for venous Insuffiency (use before diagnosis confirmed) Goals: Patient will maintain optimal edema control KAHNE, HELFAND ([id number]) [phone].pdf Page 5 of 8 Date Initiated: 03/04/2022 Target Resolution Date: 04/12/2022 Goal Status: Active Patient/caregiver will verbalize understanding of disease process and disease management Date Initiated: 03/04/2022 Target Resolution Date: 04/12/2022 Goal Status: Active Interventions: Assess peripheral edema status every visit. Compression as ordered Provide education on venous insufficiency Notes: Wound/Skin Impairment Nursing Diagnoses: Impaired tissue integrity Knowledge deficit related to ulceration/compromised skin integrity Goals: Patient/caregiver will verbalize understanding of skin care regimen Date Initiated: 03/04/2022 Target Resolution Date: 04/12/2022 Goal Status: Active Interventions: Assess ulceration(s) every visit Treatment Activities: Skin care regimen initiated : 03/04/2022 Topical wound management initiated : 03/04/2022 Notes: Electronic Signature(s) Signed: 03/04/2022 4:46:33 PM By: Adline Peals Entered By: Adline Peals on 03/04/2022 13:26:26 -------------------------------------------------------------------------------- Pain Assessment Details Patient Name: Date of Service: BLA Juan [name]. 03/04/2022 12:30 PM Medical Record Number: [id number] Patient Account Number: [id number] Date of Birth/Sex: Treating RN: March 18, 1950 (73 y.o. Male) Adline Peals Primary  Care Keysi Oelkers:  Pearlean Brownie Other Clinician: Referring Ido Wollman: Treating Tajai Suder/Extender: Clint Lipps in Treatment: 0 Active Problems Location of Pain Severity and Description of Pain Patient Has Paino No Site Locations Rate the pain. Current Pain Level: 0 AADVIK, ROKER ([id number]) [id number] Page 6 of 8 Pain Management and Medication Current Pain Management: Electronic Signature(s) Signed: 03/04/2022 4:46:33 PM By: [name] Entered By: [name] on 03/04/2022 13:18:10 -------------------------------------------------------------------------------- Patient/Caregiver Education Details Patient Name: Date of Service: BLA Milas Gain MES [name]. 1/29/2024andnbsp12:30 PM Medical Record Number: [id number] Patient Account Number: [id number] Date of Birth/Gender: Treating RN: [birth year] (72 y.o. Male) [name] Primary Care Physician: Pearlean Brownie Other Clinician: Referring Physician: Treating Physician/Extender: Clint Lipps in Treatment: 0 Education Assessment Education Provided To: Patient Education Topics Provided Welcome T [facility] Packet: o Methods: Explain/Verbal Responses: Reinforcements needed, State content correctly Electronic Signature(s) Signed: 03/04/2022 4:46:33 PM By: [name] Entered By: [name] on 03/04/2022 13:24:01 -------------------------------------------------------------------------------- Wound Assessment Details Patient Name: Date of Service: BLA Milas Gain MES [name]. 03/04/2022 12:30 PM Medical Record Number: [id number] Patient Account Number: [id number] Date of Birth/Sex: Treating RN: 06/06/1950 (72 y.o. Male) [name] Primary Care Delsin Copen: Pearlean Brownie Other Clinician: Referring Izel Hochberg: Treating Cuong Moorman/Extender: Clint Lipps in  Treatment: 0 Wound Status Wound Number: 1 Primary Lymphedema Etiology: Wound Location: Left, Lateral Lower Leg Wound Open Wounding Event: Gradually Appeared Status: Date Acquired: 01/21/2022 Comorbid Cataracts, Asthma, Chronic Obstructive Pulmonary Disease Weeks Of Treatment: 0 History: (COPD), Arrhythmia, Congestive Heart Failure, Hypertension, Clustered Wound: No Peripheral Venous Disease, Type II Diabetes, Neuropathy Photos YON, SCHIFFMAN ([id number]) [phone].pdf Page 7 of 8 Wound Measurements Length: (cm) 13.5 Width: (cm) 10 Depth: (cm) 0.1 Area: (cm) 106.029 Volume: (cm) 10.603 % Reduction in Area: % Reduction in Volume: Epithelialization: Small (1-33%) Tunneling: No Undermining: No Wound Description Classification: Full Thickness Without Exposed Support Structures Wound Margin: Distinct, outline attached Exudate Amount: Medium Exudate Type: Serous Exudate Color: amber Foul Odor After Cleansing: No Slough/Fibrino No Wound Bed Granulation Amount: Large (67-100%) Exposed Structure Granulation Quality: Red, Pink Fascia Exposed: No Necrotic Amount: None Present (0%) Fat Layer (Subcutaneous Tissue) Exposed: No Tendon Exposed: No Muscle Exposed: No Joint Exposed: No Bone Exposed: No Limited to Skin Breakdown Periwound Skin Texture Texture Color No Abnormalities Noted: Yes No Abnormalities Noted: No Rubor: Yes Moisture No Abnormalities Noted: No Temperature / Pain Dry / Scaly: Yes Temperature: No Abnormality Electronic Signature(s) Signed: 03/04/2022 4:46:33 PM By: [name] Entered By: [name] on 03/04/2022 13:20:06 -------------------------------------------------------------------------------- Vitals Details Patient Name: Date of Service: BLA Milas Gain MES [name]. 03/04/2022 12:30 PM Medical Record Number: [id number] Patient Account Number: [id number] Date of Birth/Sex: Treating RN: 11-May-1950 (72 y.o. Male)  [name] Primary Care Erine Phenix: Pearlean Brownie Other Clinician: Referring Sarkis Rhines: Treating Nicolette Gieske/Extender: Clint Lipps in Treatment: 0 Vital Signs Time Taken: 12:50 Temperature (F): 98.2 Height (in): 68 Pulse (bpm): 67 Source: Stated Respiratory Rate (breaths/min): 18 Weight (lbs): 210 Blood Pressure (mmHg): 146/81 Source: Stated Reference Range: 80 - 120 mg / dl Body Mass Index (BMI): 31.9 SAMPSON, SELF [name] ([id number]) [id number].pdf Page 8 of 8 Electronic Signature(s) Signed: 03/04/2022 4:46:33 PM By: [name] Entered By: [name] on 03/04/2022 12:50:38

## 2022-03-04 NOTE — Progress Notes (Addendum)
Juan [name], Juan [name] ([id number]) [id number] Page 1 of 9 Visit Report for 03/04/2022 Chief Complaint Document Details Patient Name: Date of Service: Juan Milas Gain MES K. 03/04/2022 12:30 PM Medical Record Number: [id number] Patient Account Number: [id number] Date of Birth/Sex: Treating RN: 1950-08-23 (72 y.o. Male) Primary Care Provider: Pearlean [name] Other Clinician: Referring Provider: Treating Provider/Extender: Juan [name] in Treatment: 0 Information Obtained from: Patient Chief Complaint Patient presents for treatment of an open ulcer due to venous insufficiency Electronic Signature(s) Signed: 03/04/2022 2:01:17 PM By: Juan Guess MD FACS Previous Signature: 03/04/2022 12:49:22 PM Version By: Juan Guess MD FACS Entered By: Juan [name] on 03/04/2022 14:01:17 -------------------------------------------------------------------------------- Debridement Details Patient Name: Date of Service: Juan Milas Gain MES K. 03/04/2022 12:30 PM Medical Record Number: [id number] Patient Account Number: [id number] Date of Birth/Sex: Treating RN: 07/30/1950 (71 y.o. Male) Juan [name] Primary Care Provider: Pearlean [name] Other Clinician: Referring Provider: Treating Provider/Extender: Juan [name] in Treatment: 0 Debridement Performed for Assessment: Wound #1 Left,Lateral Lower Leg Performed By: Physician Juan Guess, MD Debridement Type: Debridement Severity of Tissue Pre Debridement: Limited to breakdown of skin Level of Consciousness (Pre-procedure): Awake and Alert Pre-procedure Verification/Time Out Yes - 13:27 Taken: Start Time: 13:27 Pain Control: Lidocaine 4% Topical Solution T Area Debrided (L x W): otal 4 (cm) x 3 (cm) = 12 (cm) Tissue and other material debrided: Non-Viable, Skin: Epidermis Level: Skin/Epidermis Debridement Description: Selective/Open  Wound Instrument: Forceps, Scissors Bleeding: Minimum Hemostasis Achieved: Pressure Response to Treatment: Procedure was tolerated well Level of Consciousness (Post- Awake and Alert procedure): Post Debridement Measurements of Total Wound Length: (cm) 13.5 Width: (cm) 10 Depth: (cm) 0.1 Volume: (cm) 10.603 Character of Wound/Ulcer Post Debridement: Improved Severity of Tissue Post Debridement: Limited to breakdown of skin Post Procedure Diagnosis Juan [name], Juan [name] ([id number]) [id number] Page 2 of 9 Same as Pre-procedure Notes scribed for Dr. Lady [name] by Juan Bruin, RN Electronic Signature(s) Signed: 03/04/2022 4:07:33 PM By: Juan Guess MD FACS Signed: 03/04/2022 4:46:33 PM By: Juan [name] By: Juan [name] on 03/04/2022 13:28:08 -------------------------------------------------------------------------------- HPI Details Patient Name: Date of Service: Juan Milas Gain MES K. 03/04/2022 12:30 PM Medical Record Number: [id number] Patient Account Number: [id number] Date of Birth/Sex: Treating RN: [birth year] (72 y.o. Male) Primary Care Provider: Pearlean [name] Other Clinician: Referring Provider: Treating Provider/Extender: Juan [name] in Treatment: 0 History of Present Illness HPI Description: ADMISSION 03/04/2022 This is a 72 year old newly diagnosed type II diabetic (hemoglobin A1c 7.3%) with congestive heart failure, hypertension, significant ethanol use, recent tobacco cessation and decreased mobility. About 6 weeks ago, he developed an ulcer on his left lower leg. His primary care provider put him in an Unna boot and referred him to the wound care center. While he was waiting for his appointment, he underwent several Unna boot changes with good improvement in the size and appearance of the wound. There is a large superficial ulcer on the patient's left lower lateral leg. There is some  dead dry skin hanging from the edges, but otherwise the wound is clean and limited just to the breakdown of skin. Electronic Signature(s) Signed: 03/04/2022 2:14:48 PM By: Juan Guess MD FACS Previous Signature: 03/04/2022 12:53:59 PM Version By: Juan Guess MD FACS Entered By: Juan [name] on 03/04/2022 14:14:48 -------------------------------------------------------------------------------- Physical Exam Details Patient Name: Date of Service: Juan Milas Gain MES K. 03/04/2022 12:30 PM Medical Record Number: [id number] Patient Account Number: [id number] Date of Birth/Sex: Treating RN: 08-Apr-1950 (72 y.o. Male) Primary Care Provider: Deirdre Priest,  Gaynell [name] Other Clinician: Referring Provider: Treating Provider/Extender: Juan [name] in Treatment: 0 Constitutional Slightly hypertensive. . . . No acute distress. Respiratory Normal work of breathing on room air. Notes 03/04/2022: There is a large superficial ulcer on the patient's left lower lateral leg. There is some dead dry skin hanging from the edges, but otherwise the wound is clean and limited just to the breakdown of skin. Electronic Signature(s) Signed: 03/04/2022 2:15:34 PM By: Juan Guess MD FACS Juan [name] ([id number]) [id number] Page 3 of 9 Entered By: Juan [name] on 03/04/2022 14:15:33 -------------------------------------------------------------------------------- Physician Orders Details Patient Name: Date of Service: Juan Milas Gain MES K. 03/04/2022 12:30 PM Medical Record Number: [id number] Patient Account Number: [id number] Date of Birth/Sex: Treating RN: [birth year] (71 y.o. Male) Juan [name] Primary Care Provider: Pearlean [name] Other Clinician: Referring Provider: Treating Provider/Extender: Juan [name] in Treatment: 0 Verbal / Phone Orders: No Diagnosis Coding ICD-10 Coding Code  Description [phone] Non-pressure chronic ulcer of left calf limited to breakdown of skin I87.2 Venous insufficiency (chronic) (peripheral) I50.22 Chronic systolic (congestive) heart failure E11.9 Type 2 diabetes mellitus without complications E11.622 Type 2 diabetes mellitus with other skin ulcer Follow-up Appointments ppointment in 1 week. - Dr. Lady [name] - room 2 Return A Anesthetic (In clinic) Topical Lidocaine 4% applied to wound bed Bathing/ Shower/ Hygiene May shower with protection but do not get wound dressing(s) wet. Protect dressing(s) with water repellant cover (for example, large plastic bag) or a cast cover and may then take shower. Edema Control - Lymphedema / SCD / Other Elevate legs to the level of the heart or above for 30 minutes daily and/or when sitting for 3-4 times a day throughout the day. Avoid standing for long periods of time. Patient to wear own compression stockings every day. Moisturize legs daily. Compression stocking or Garment 20-30 mm/Hg pressure to: Wound Treatment Wound #1 - Lower Leg Wound Laterality: Left, Lateral Cleanser: Soap and Water 1 x Per Week/30 Days Discharge Instructions: May shower and wash wound with dial antibacterial soap and water prior to dressing change. Cleanser: Wound Cleanser 1 x Per Week/30 Days Discharge Instructions: Cleanse the wound with wound cleanser prior to applying a clean dressing using gauze sponges, not tissue or cotton balls. Prim Dressing: Sorbalgon AG Dressing 6x6 (in/in) 1 x Per Week/30 Days ary Discharge Instructions: Apply to wound bed as instructed Secondary Dressing: ABD Pad, 5x9 1 x Per Week/30 Days Discharge Instructions: Apply over primary dressing as directed. Compression Wrap: ThreePress (3 layer compression wrap) 1 x Per Week/30 Days Discharge Instructions: Apply three layer compression as directed. Patient Medications llergies: Coreg, aspartame, phenylalanine A Notifications Medication Indication  Start End 03/04/2022 lidocaine DOSE topical 4 % cream - cream topical Electronic Signature(s) Juan [name], Juan [name] ([id number]) [id number] Page 4 of 9 Signed: 03/04/2022 4:07:33 PM By: Juan Guess MD FACS Entered By: Juan [name] on 03/04/2022 14:15:45 -------------------------------------------------------------------------------- Problem List Details Patient Name: Date of Service: Juan Milas Gain MES K. 03/04/2022 12:30 PM Medical Record Number: [id number] Patient Account Number: [id number] Date of Birth/Sex: Treating RN: [birth year] (72 y.o. Male) Primary Care Provider: Pearlean [name] Other Clinician: Referring Provider: Treating Provider/Extender: Juan [name] in Treatment: 0 Active Problems ICD-10 Encounter Code Description Active Date MDM Diagnosis L97.221 Non-pressure chronic ulcer of left calf limited to breakdown of skin 03/04/2022 No Yes I87.2 Venous insufficiency (chronic) (peripheral) 03/04/2022 No Yes I50.22 Chronic systolic (congestive) heart failure 03/04/2022 No Yes E11.9 Type 2 diabetes mellitus without complications 03/04/2022  No Yes E11.622 Type 2 diabetes mellitus with other skin ulcer 03/04/2022 No Yes Inactive Problems Resolved Problems Electronic Signature(s) Signed: 03/04/2022 2:01:01 PM By: Fredirick Maudlin MD FACS Previous Signature: 03/04/2022 12:49:08 PM Version By: Fredirick Maudlin MD FACS Entered By: Fredirick Maudlin on 03/04/2022 14:01:00 -------------------------------------------------------------------------------- Progress Note Details Patient Name: Date of Service: Juan Keane Scrape MES K. 03/04/2022 12:30 PM Medical Record Number: [id number] Patient Account Number: [id number] Date of Birth/Sex: Treating RN: 09-20-1950 (72 y.o. Male) Primary Care Provider: [name] Other Clinician: Referring Provider: Treating Provider/Extender: Felipa Eth in  Treatment: [address] KAMERYN, TISDEL ([id number]) [id number] Page 5 of 9 Chief Complaint Information obtained from Patient Patient presents for treatment of an open ulcer due to venous insufficiency History of Present Illness (HPI) ADMISSION 03/04/2022 This is a 72 year old newly diagnosed type II diabetic (hemoglobin A1c 7.3%) with congestive heart failure, hypertension, significant ethanol use, recent tobacco cessation and decreased mobility. About 6 weeks ago, he developed an ulcer on his left lower leg. His primary care provider put him in an [place] and referred him to the wound care center. While he was waiting for his appointment, he underwent several Unna boot changes with good improvement in the size and appearance of the wound. There is a large superficial ulcer on the patient's left lower lateral leg. There is some dead dry skin hanging from the edges, but otherwise the wound is clean and limited just to the breakdown of skin. Patient History Information obtained from Patient. Allergies Coreg, aspartame, phenylalanine Family History Diabetes - Mother, Heart Disease - Mother, Lung Disease - Father, No family history of Cancer, Hereditary Spherocytosis, Hypertension, Kidney Disease, Seizures, Stroke, Thyroid Problems, Tuberculosis. Social History Former smoker - quit 1 year ago, Marital Status - Married, Alcohol Use - Daily - beer 24-36 oz a day, wine, Drug Use - No History, Caffeine Use - Daily. Medical History Eyes Patient has history of Cataracts Respiratory Patient has history of Asthma, Chronic Obstructive Pulmonary Disease (COPD) Cardiovascular Patient has history of Arrhythmia - a-fib, Congestive Heart Failure, Hypertension, Peripheral Venous Disease Denies history of Angina Endocrine Patient has history of Type II Diabetes Neurologic Patient has history of Neuropathy Patient is treated with Oral Agents. Hospitalization/Surgery  History - toenail excision. Medical A Surgical History Notes nd Eyes macular degeneration in right eye Psychiatric anxiety and depression Review of Systems (ROS) Constitutional Symptoms (General Health) Denies complaints or symptoms of Fatigue, Fever, Chills, Marked Weight Change. Ear/Nose/Mouth/Throat Complains or has symptoms of Chronic sinus problems or rhinitis. Respiratory Complains or has symptoms of Chronic or frequent coughs, Shortness of Breath. Cardiovascular Denies complaints or symptoms of Chest pain. Gastrointestinal Denies complaints or symptoms of Frequent diarrhea, Nausea, Vomiting, umbilical hernia Genitourinary Complains or has symptoms of Frequent urination - fluid pill. Integumentary (Skin) Complains or has symptoms of Wounds. Musculoskeletal Complains or has symptoms of Muscle Weakness. Objective Constitutional Slightly hypertensive. No acute distress. Vitals Time Taken: 12:50 PM, Height: 68 in, Source: Stated, Weight: 210 lbs, Source: Stated, BMI: 31.9, Temperature: 98.2 F, Pulse: 67 bpm, Respiratory Rate: 18 breaths/min, Blood Pressure: 146/81 mmHg. Respiratory Normal work of breathing on room air. Juan [name], Juan [name] ([id number]) [id number] Page 6 of 9 General Notes: 03/04/2022: There is a large superficial ulcer on the patient's left lower lateral leg. There is some dead dry skin hanging from the edges, but otherwise the wound is clean and limited just to the breakdown of skin. Integumentary (Hair, Skin) Wound #1 status is Open. Original cause  of wound was Gradually Appeared. The date acquired was: 01/21/2022. The wound is located on the Left,Lateral Lower Leg. The wound measures 13.5cm length x 10cm width x 0.1cm depth; 106.029cm^2 area and 10.603cm^3 volume. The wound is limited to skin breakdown. There is no tunneling or undermining noted. There is a medium amount of serous drainage noted. The wound margin is distinct with  the outline attached to the wound base. There is large (67-100%) red, pink granulation within the wound bed. There is no necrotic tissue within the wound bed. The periwound skin appearance had no abnormalities noted for texture. The periwound skin appearance exhibited: Dry/Scaly, Rubor. Periwound temperature was noted as No Abnormality. Assessment Active Problems ICD-10 Non-pressure chronic ulcer of left calf limited to breakdown of skin Venous insufficiency (chronic) (peripheral) Chronic systolic (congestive) heart failure Type 2 diabetes mellitus without complications Type 2 diabetes mellitus with other skin ulcer Procedures Wound #1 Pre-procedure diagnosis of Wound #1 is a Venous Leg Ulcer located on the Left,Lateral Lower Leg .Severity of Tissue Pre Debridement is: Limited to breakdown of skin. There was a Selective/Open Wound Skin/Epidermis Debridement with a total area of 12 sq cm performed by Juan Guess, MD. With the following instrument(s): Forceps, and Scissors to remove Non-Viable tissue/material. Material removed includes Skin: Epidermis after achieving pain control using Lidocaine 4% T opical Solution. No specimens were taken. A time out was conducted at 13:27, prior to the start of the procedure. A Minimum amount of bleeding was controlled with Pressure. The procedure was tolerated well. Post Debridement Measurements: 13.5cm length x 10cm width x 0.1cm depth; 10.603cm^3 volume. Character of Wound/Ulcer Post Debridement is improved. Severity of Tissue Post Debridement is: Limited to breakdown of skin. Post procedure Diagnosis Wound #1: Same as Pre-Procedure General Notes: scribed for Dr. Lady [name] by Juan Bruin, RN. Pre-procedure diagnosis of Wound #1 is a Venous Leg Ulcer located on the Left,Lateral Lower Leg . There was a Three Layer Compression Therapy Procedure by Juan Bruin, RN. Post procedure Diagnosis Wound #1: Same as Pre-Procedure Plan Follow-up  Appointments: Return Appointment in 1 week. - Dr. Lady [name] - room 2 Anesthetic: (In clinic) Topical Lidocaine 4% applied to wound bed Bathing/ Shower/ Hygiene: May shower with protection but do not get wound dressing(s) wet. Protect dressing(s) with water repellant cover (for example, large plastic bag) or a cast cover and may then take shower. Edema Control - Lymphedema / SCD / Other: Elevate legs to the level of the heart or above for 30 minutes daily and/or when sitting for 3-4 times a day throughout the day. Avoid standing for long periods of time. Patient to wear own compression stockings every day. Moisturize legs daily. Compression stocking or Garment 20-30 mm/Hg pressure to: The following medication(s) was prescribed: lidocaine topical 4 % cream cream topical was prescribed at facility WOUND #1: - Lower Leg Wound Laterality: Left, Lateral Cleanser: Soap and Water 1 x Per Week/30 Days Discharge Instructions: May shower and wash wound with dial antibacterial soap and water prior to dressing change. Cleanser: Wound Cleanser 1 x Per Week/30 Days Discharge Instructions: Cleanse the wound with wound cleanser prior to applying a clean dressing using gauze sponges, not tissue or cotton balls. Prim Dressing: Sorbalgon AG Dressing 6x6 (in/in) 1 x Per Week/30 Days ary Discharge Instructions: Apply to wound bed as instructed Secondary Dressing: ABD Pad, 5x9 1 x Per Week/30 Days Discharge Instructions: Apply over primary dressing as directed. Com pression Wrap: ThreePress (3 layer compression wrap) 1 x Per Week/30 Days Discharge  Instructions: Apply three layer compression as directed. 03/04/2022: This is a 72 year old newly diagnosed diabetic with congestive heart failure who presents with a venous stasis ulcer on his left leg. There is a large superficial ulcer on the patient's left lower lateral leg. There is some dead dry skin hanging from the edges, but otherwise the wound is clean and limited  just to the breakdown of skin. Juan [name], Juan [name] ([id number]) [id number] Page 7 of 9 I used forceps and scissors to trim away the dead skin from his wound. It is otherwise quite clean. We will use silver alginate and 3 layer compression. He was reminded of the importance of elevating his leg whenever possible throughout the day and at night when he sleeps. I anticipate that this will heal quite quickly. He will need compression stockings going forward and we will measure him for those as well as give him the information for elastic therapy in [place]. Follow-up in 1 week. Electronic Signature(s) Signed: 03/04/2022 4:07:33 PM By: Fredirick Maudlin MD FACS Signed: 03/04/2022 4:46:33 PM By: Adline Peals Previous Signature: 03/04/2022 2:17:19 PM Version By: Fredirick Maudlin MD FACS Entered By: Adline Peals on 03/04/2022 15:02:11 -------------------------------------------------------------------------------- HxROS Details Patient Name: Date of Service: Juan Keane Scrape MES K. 03/04/2022 12:30 PM Medical Record Number: [id number] Patient Account Number: [id number] Date of Birth/Sex: Treating RN: 1950-10-04 (71 y.o. Male) Adline Peals Primary Care Provider: [name] Other Clinician: Referring Provider: Treating Provider/Extender: Felipa Eth in Treatment: 0 Information Obtained From Patient Constitutional Symptoms (General Health) Complaints and Symptoms: Negative for: Fatigue; Fever; Chills; Marked Weight Change Ear/Nose/Mouth/Throat Complaints and Symptoms: Positive for: Chronic sinus problems or rhinitis Respiratory Complaints and Symptoms: Positive for: Chronic or frequent coughs; Shortness of Breath Medical History: Positive for: Asthma; Chronic Obstructive Pulmonary Disease (COPD) Cardiovascular Complaints and Symptoms: Negative for: Chest pain Medical History: Positive for: Arrhythmia - a-fib;  Congestive Heart Failure; Hypertension; Peripheral Venous Disease Negative for: Angina Gastrointestinal Complaints and Symptoms: Negative for: Frequent diarrhea; Nausea; Vomiting Review of System Notes: umbilical hernia Genitourinary Complaints and Symptoms: Positive for: Frequent urination - fluid pill Integumentary (Skin) Complaints and Symptoms: Positive for: Wounds Musculoskeletal Juan [name], Juan [name] ([id number]) [phone].pdf Page 8 of 9 Complaints and Symptoms: Positive for: Muscle Weakness Eyes Medical History: Positive for: Cataracts Past Medical History Notes: macular degeneration in right eye Hematologic/Lymphatic Endocrine Medical History: Positive for: Type II Diabetes Time with diabetes: 2 weeks Treated with: Oral agents Immunological Neurologic Medical History: Positive for: Neuropathy Oncologic Psychiatric Medical History: Past Medical History Notes: anxiety and depression HBO Extended History Items Eyes: Cataracts Immunizations Pneumococcal Vaccine: Received Pneumococcal Vaccination: No Implantable Devices None Hospitalization / Surgery History Type of Hospitalization/Surgery toenail excision Family and Social History Cancer: No; Diabetes: Yes - Mother; Heart Disease: Yes - Mother; Hereditary Spherocytosis: No; Hypertension: No; Kidney Disease: No; Lung Disease: Yes - Father; Seizures: No; Stroke: No; Thyroid Problems: No; Tuberculosis: No; Former smoker - quit 1 year ago; Marital Status - Married; Alcohol Use: Daily - beer 24-36 oz a day, wine; Drug Use: No History; Caffeine Use: Daily; Financial Concerns: No; Food, Clothing or Shelter Needs: No; Support System Lacking: No; Transportation Concerns: No Electronic Signature(s) Signed: 03/04/2022 4:07:33 PM By: Fredirick Maudlin MD FACS Signed: 03/04/2022 4:46:33 PM By: Sabas Sous By: Adline Peals on 03/04/2022  12:59:42 -------------------------------------------------------------------------------- SuperBill Details Patient Name: Date of Service: Juan Keane Scrape MES K. 03/04/2022 Medical Record Number: [id number] Patient Account Number: [id number] Date of Birth/Sex: Treating RN: April 26, 1950 (72 y.o. Male) [place],  Juan [name] ([id number]) [id number] Page 9 of 9 Primary Care Provider: Pearlean [name] Other Clinician: Referring Provider: Treating Provider/Extender: Juan [name] in Treatment: 0 Diagnosis Coding ICD-10 Codes Code Description [id number] Non-pressure chronic ulcer of left calf limited to breakdown of skin I87.2 Venous insufficiency (chronic) (peripheral) I50.22 Chronic systolic (congestive) heart failure E11.9 Type 2 diabetes mellitus without complications E11.622 Type 2 diabetes mellitus with other skin ulcer Facility Procedures : 7 CPT4 Code: [id number] Description: 97597 - DEBRIDE WOUND 1ST 20 SQ CM OR < ICD-10 Diagnosis Description L97.221 Non-pressure chronic ulcer of left calf limited to breakdown of skin Modifier: Quantity: 1 Physician Procedures : CPT4 Code Description Modifier [id number] - WC PHYS LEVEL 4 - NEW PT 25 ICD-10 Diagnosis Description L97.221 Non-pressure chronic ulcer of left calf limited to breakdown of skin I87.2 Venous insufficiency (chronic) (peripheral) I50.22 Chronic  systolic (congestive) heart failure E11.622 Type 2 diabetes mellitus with other skin ulcer Quantity: 1 : [id number] - WC PHYS DEBR WO ANESTH 20 SQ CM ICD-10 Diagnosis Description L97.221 Non-pressure chronic ulcer of left calf limited to breakdown of skin Quantity: 1 Electronic Signature(s) Signed: 03/04/2022 2:17:39 PM By: Juan Guess MD FACS Entered By: Juan [name] on 03/04/2022 14:17:39

## 2022-03-04 NOTE — Patient Instructions (Signed)
Good to see you today - Thank you for coming in  Things we discussed today:  You need an diabetes eye exam every year.  Please see your eye doctor.  Ask them to fax [place] a report of your exam   Please always bring your medication bottles  Come back to see me in ***

## 2022-03-04 NOTE — Progress Notes (Signed)
AMONTAE, NG ([id number]) [id number] Page 1 of 4 Visit Report for 03/04/2022 Abuse Risk Screen Details Patient Name: Date of Service: Juan Keane Scrape MES K. 03/04/2022 12:30 PM Medical Record Number: [id number] Patient Account Number: [id number] Date of Birth/Sex: Treating RN: 15-Apr-1950 (71 y.o. Male) Juan [name] Primary Care Juan [name]: Juan [name] Other Clinician: Referring Juan [name]: Treating Juan [name]/Extender: Juan [name] in Treatment: 0 Abuse Risk Screen Items Answer ABUSE RISK SCREEN: Has anyone close to you tried to hurt or harm you recentlyo No Do you feel uncomfortable with anyone in your familyo No Has anyone forced you do things that you didnt want to doo No Electronic Signature(s) Signed: 03/04/2022 4:46:33 PM By: Juan [name] Entered By: Juan [name] on 03/04/2022 12:59:49 -------------------------------------------------------------------------------- Activities of Daily Living Details Patient Name: Date of Service: Juan Keane Scrape MES K. 03/04/2022 12:30 PM Medical Record Number: [id number] Patient Account Number: [id number] Date of Birth/Sex: Treating RN: May 20, 1950 (71 y.o. Male) Juan [name] Primary Care Lakresha Stifter: Juan [name] Other Clinician: Referring Juan [name]: Treating Kinsie Belford/Extender: Juan [name] in Treatment: 0 Activities of Daily Living Items Answer Activities of Daily Living (Please select one for each item) Drive Automobile Not Able T Medications ake Completely Able Use T elephone Completely Able Care for Appearance Need Assistance Use T oilet Completely Able Bath / Shower Need Assistance Dress Self Need Assistance Feed Self Completely Able Walk Need Assistance Get In / Out Bed Need Assistance Housework Not Able Prepare Meals Not Able Handle Money Not Able Shop for Self Not Able Electronic  Signature(s) Signed: 03/04/2022 4:46:33 PM By: Juan [name] Entered By: Juan [name] on 03/04/2022 13:00:29 Juan [name] ([id number]) [phone] [id number] Page 2 of 4 -------------------------------------------------------------------------------- Education Screening Details Patient Name: Date of Service: Juan Keane Scrape MES K. 03/04/2022 12:30 PM Medical Record Number: [id number] Patient Account Number: [id number] Date of Birth/Sex: Treating RN: December 27, 1950 (71 y.o. Male) Juan [name] Primary Care Juan [name]: Juan [name] Other Clinician: Referring Kamiah Fite: Treating Cary Lothrop/Extender: Juan [name] in Treatment: 0 Primary Learner Assessed: Patient Learning Preferences/Education Level/Primary Language Learning Preference: Explanation, Demonstration, Video, Printed Material Highest Education Level: High School Preferred Language: English Cognitive Barrier Language Barrier: No Translator Needed: No Memory Deficit: No Emotional Barrier: No Cultural/Religious Beliefs Affecting Medical Care: No Physical Barrier Impaired Vision: No Impaired Hearing: No Decreased Hand dexterity: No Knowledge/Comprehension Knowledge Level: Medium Comprehension Level: Medium Ability to understand written instructions: Medium Ability to understand verbal instructions: Medium Motivation Anxiety Level: Calm Cooperation: Cooperative Education Importance: Acknowledges Need Interest in Health Problems: Asks Questions Perception: Coherent Willingness to Engage in Self-Management Medium Activities: Readiness to Engage in Self-Management Medium Activities: Electronic Signature(s) Signed: 03/04/2022 4:46:33 PM By: Juan [name] Entered By: Juan [name] on 03/04/2022 13:01:09 -------------------------------------------------------------------------------- Fall Risk Assessment Details Patient Name: Date of  Service: Juan Keane Scrape MES K. 03/04/2022 12:30 PM Medical Record Number: [id number] Patient Account Number: [id number] Date of Birth/Sex: Treating RN: 1950-04-04 (71 y.o. Male) Juan [name] Primary Care Juan [name]: Juan [name] Other Clinician: Referring Juan [name]: Treating Juan [name]/Extender: Juan [name] in Treatment: 0 Fall Risk Assessment Items Have you had 2 or more falls in the last 12 monthso 0 No FARREN, NELLES K ([id number]) [phone] [id number] Page 3 of 4 Have you had any fall that resulted in injury in the last 12 monthso 0 No FALLS RISK SCREEN History of falling - immediate or within 3 months 0 No Secondary diagnosis (Do you have 2 or more medical diagnoseso) 15 Yes Ambulatory aid None/bed rest/wheelchair/nurse  0 Yes Crutches/cane/walker 0 No Furniture 0 No Intravenous therapy Access/Saline/Heparin Lock 0 No Gait/Transferring Normal/ bed rest/ wheelchair 0 Yes Weak (short steps with or without shuffle, stooped but able to lift head while walking, may seek 0 No support from furniture) Impaired (short steps with shuffle, may have difficulty arising from chair, head down, impaired 0 No balance) Mental Status Oriented to own ability 0 Yes Electronic Signature(s) Signed: 03/04/2022 4:46:33 PM By: Juan [name] Entered By: Juan [name] on 03/04/2022 13:01:59 -------------------------------------------------------------------------------- Foot Assessment Details Patient Name: Date of Service: Juan Keane Scrape MES K. 03/04/2022 12:30 PM Medical Record Number: [id number] Patient Account Number: [id number] Date of Birth/Sex: Treating RN: 04/06/1950 (71 y.o. Male) Juan [name] Primary Care Roby Donaway: Juan [name] Other Clinician: Referring Juan [name]: Treating Juan [name]/Extender: Juan [name] in Treatment: 0 Foot Assessment Items Site Locations + = Sensation  present, - = Sensation absent, C = Callus, U = Ulcer R = Redness, W = Warmth, M = Maceration, PU = Pre-ulcerative lesion F = Fissure, S = Swelling, D = Dryness Assessment Right: Left: Other Deformity: No No Prior Foot Ulcer: No No Prior Amputation: No No Charcot Joint: No No Ambulatory Status: Ambulatory With Help Assistance Device: Wheelchair Juan [name], Juan [name] ([id number]) [phone] [id number] Page 4 of 4 Gait: Steady Electronic Signature(s) Signed: 03/04/2022 4:46:33 PM By: Juan [name] Entered By: Juan [name] on 03/04/2022 13:12:01 -------------------------------------------------------------------------------- Nutrition Risk Screening Details Patient Name: Date of Service: Juan Keane Scrape MES K. 03/04/2022 12:30 PM Medical Record Number: [id number] Patient Account Number: [id number] Date of Birth/Sex: Treating RN: 10/05/1950 (71 y.o. Male) Juan [name] Primary Care Toluwani Yadav: Juan [name] Other Clinician: Referring Sharnise Blough: Treating Aliza Moret/Extender: Juan [name] in Treatment: 0 Height (in): 68 Weight (lbs): 210 Body Mass Index (BMI): 31.9 Nutrition Risk Screening Items Score Screening NUTRITION RISK SCREEN: I have an illness or condition that made me change the kind and/or amount of food I eat 0 No I eat fewer than two meals per day 0 No I eat few fruits and vegetables, or milk products 2 Yes I have three or more drinks of beer, liquor or wine almost every day 2 Yes I have tooth or mouth problems that make it hard for me to eat 0 No I don't always have enough money to buy the food I need 0 No I eat alone most of the time 0 No I take three or more different prescribed or over-the-counter drugs a day 1 Yes Without wanting to, I have lost or gained 10 pounds in the last six months 0 No I am not always physically able to shop, cook and/or feed myself 0 No Nutrition Protocols Good Risk  Protocol Moderate Risk Protocol 0 Provide education on nutrition High Risk Proctocol Risk Level: Moderate Risk Score: 5 Electronic Signature(s) Signed: 03/04/2022 4:46:33 PM By: Juan [name] Entered By: Juan [name] on 03/04/2022 13:02:40

## 2022-03-05 ENCOUNTER — Ambulatory Visit (INDEPENDENT_AMBULATORY_CARE_PROVIDER_SITE_OTHER): Payer: Medicare HMO | Admitting: Family Medicine

## 2022-03-05 ENCOUNTER — Encounter: Payer: Self-pay | Admitting: Family Medicine

## 2022-03-05 ENCOUNTER — Other Ambulatory Visit: Payer: Self-pay

## 2022-03-05 VITALS — BP 114/66 | HR 89 | Ht 66.0 in | Wt 207.4 lb

## 2022-03-05 DIAGNOSIS — F1020 Alcohol dependence, uncomplicated: Secondary | ICD-10-CM

## 2022-03-05 DIAGNOSIS — E119 Type 2 diabetes mellitus without complications: Secondary | ICD-10-CM | POA: Diagnosis not present

## 2022-03-05 DIAGNOSIS — Z720 Tobacco use: Secondary | ICD-10-CM

## 2022-03-05 DIAGNOSIS — I10 Essential (primary) hypertension: Secondary | ICD-10-CM

## 2022-03-05 DIAGNOSIS — I480 Paroxysmal atrial fibrillation: Secondary | ICD-10-CM

## 2022-03-05 DIAGNOSIS — R69 Illness, unspecified: Secondary | ICD-10-CM | POA: Diagnosis not present

## 2022-03-05 NOTE — Assessment & Plan Note (Signed)
Is at goal today.  Not confident exactly what medications he is taking.

## 2022-03-05 NOTE — Assessment & Plan Note (Signed)
>>  ASSESSMENT AND PLAN FOR PAROXYSMAL ATRIAL FIBRILLATION (HCC) WRITTEN ON 03/05/2022 11:14 AM BY Annalyn Blecher L, MD  Not in afib in exam.  Encouraged to follow up with cardiology.  May need prolong monitoring.  We discussed the negative effects of alcohol on his heart

## 2022-03-05 NOTE — Assessment & Plan Note (Signed)
Not in afib in exam.  Encouraged to follow up with cardiology.  May need prolong monitoring.  We discussed the negative effects of alcohol on his heart

## 2022-03-05 NOTE — Assessment & Plan Note (Signed)
Tolerating metformin.  We discussed diet changes - decreasing sweet drinks and beer

## 2022-03-05 NOTE — Assessment & Plan Note (Signed)
Discussed the negative effects on his heart and kidneys and liver and how this is more important than his diabetes treatment.  He will consider cutting down

## 2022-03-11 ENCOUNTER — Encounter (HOSPITAL_BASED_OUTPATIENT_CLINIC_OR_DEPARTMENT_OTHER): Payer: Medicare HMO | Attending: General Surgery | Admitting: General Surgery

## 2022-03-11 DIAGNOSIS — I110 Hypertensive heart disease with heart failure: Secondary | ICD-10-CM | POA: Insufficient documentation

## 2022-03-11 DIAGNOSIS — I872 Venous insufficiency (chronic) (peripheral): Secondary | ICD-10-CM | POA: Insufficient documentation

## 2022-03-11 DIAGNOSIS — Z87891 Personal history of nicotine dependence: Secondary | ICD-10-CM | POA: Insufficient documentation

## 2022-03-11 DIAGNOSIS — I5022 Chronic systolic (congestive) heart failure: Secondary | ICD-10-CM | POA: Diagnosis not present

## 2022-03-11 DIAGNOSIS — L97221 Non-pressure chronic ulcer of left calf limited to breakdown of skin: Secondary | ICD-10-CM | POA: Diagnosis not present

## 2022-03-11 DIAGNOSIS — E11622 Type 2 diabetes mellitus with other skin ulcer: Secondary | ICD-10-CM | POA: Diagnosis not present

## 2022-03-11 DIAGNOSIS — L97822 Non-pressure chronic ulcer of other part of left lower leg with fat layer exposed: Secondary | ICD-10-CM | POA: Diagnosis not present

## 2022-03-11 NOTE — Progress Notes (Signed)
MARSEAN, ELKHATIB ([id number]) [id number] Page 1 of 6 Visit Report for 03/11/2022 Chief Complaint Document Details Patient Name: Date of Service: BLA Juan [name] MES [name]. 03/11/2022 2:00 PM Medical Record Number: [id number] Patient Account Number: [id number] Date of Birth/Sex: Treating RN: October 18, 1950 (72 y.o. M) Primary Care Provider: [name] Other Clinician: Referring Provider: Treating Provider/Extender: Felipa Eth in Treatment: 1 Information Obtained from: Patient Chief Complaint Patient presents for treatment of an open ulcer due to venous insufficiency Electronic Signature(s) Signed: 03/11/2022 2:15:22 PM By: Fredirick Maudlin MD FACS Entered By: Fredirick Maudlin on 03/11/2022 14:15:22 -------------------------------------------------------------------------------- HPI Details Patient Name: Date of Service: BLA Juan [name] MES [name]. 03/11/2022 2:00 PM Medical Record Number: [id number] Patient Account Number: [id number] Date of Birth/Sex: Treating RN: [birth year] (72 y.o. M) Primary Care Provider: [name] Other Clinician: Referring Provider: Treating Provider/Extender: Felipa Eth in Treatment: 1 History of Present Illness HPI Description: ADMISSION 03/04/2022 This is a 72 year old newly diagnosed type II diabetic (hemoglobin A1c 7.3%) with congestive heart failure, hypertension, significant ethanol use, recent tobacco cessation and decreased mobility. About 6 weeks ago, he developed an ulcer on his left lower leg. His primary care provider put him in an [place] and referred him to the wound care center. While he was waiting for his appointment, he underwent several Unna boot changes with good improvement in the size and appearance of the wound. There is a large superficial ulcer on the patient's left lower lateral leg. There is some dead dry skin hanging from the edges, but  otherwise the wound is clean and limited just to the breakdown of skin. 03/11/2022: His wound is healed. He does have compression stockings on order and according to his wife, they shipped out today from [place]. Electronic Signature(s) Signed: 03/11/2022 2:32:38 PM By: Fredirick Maudlin MD FACS Entered By: Fredirick Maudlin on 03/11/2022 14:32:38 Physical Exam Details -------------------------------------------------------------------------------- Juan [name] ([id number]) [id number] Page 2 of 6 Patient Name: Date of Service: BLA Juan [name] MES [name]. 03/11/2022 2:00 PM Medical Record Number: [id number] Patient Account Number: [id number] Date of Birth/Sex: Treating RN: 1950/02/27 (72 y.o. M) Primary Care Provider: [name] Other Clinician: Referring Provider: Treating Provider/Extender: Felipa Eth in Treatment: 1 Constitutional . . . . no acute distress. Respiratory Normal work of breathing on room air. Notes 03/11/2022: His wound is healed. Electronic Signature(s) Signed: 03/11/2022 2:36:09 PM By: Fredirick Maudlin MD FACS Entered By: Fredirick Maudlin on 03/11/2022 14:36:08 -------------------------------------------------------------------------------- Physician Orders Details Patient Name: Date of Service: BLA Juan [name] MES [name]. 03/11/2022 2:00 PM Medical Record Number: [id number] Patient Account Number: [id number] Date of Birth/Sex: Treating RN: [birth year] (72 y.o. [name] Provider: [name] Other Clinician: Referring Provider: Treating Provider/Extender: Felipa Eth in Treatment: 1 Verbal / Phone Orders: No Diagnosis Coding ICD-10 Coding Code Description [phone] Non-pressure chronic ulcer of left calf limited to breakdown of skin I87.2 Venous insufficiency (chronic) (peripheral) T62.26 Chronic systolic (congestive) heart failure E11.9 Type 2  diabetes mellitus without complications J33.545 Type 2 diabetes mellitus with other skin ulcer Discharge From [facility] Services Discharge from [place]!!!! Edema Control - Lymphedema / SCD / Other Elevate legs to the level of the heart or above for 30 minutes daily and/or when sitting for 3-4 times a day throughout the day. Avoid standing for long periods of time. Patient to wear own compression stockings every day. Moisturize legs daily. Compression stocking or Garment 20-30 mm/Hg pressure to:  Electronic Signature(s) Signed: 03/11/2022 2:36:20 PM By: Fredirick Maudlin MD FACS Entered By: Fredirick Maudlin on 03/11/2022 14:36:20 -------------------------------------------------------------------------------- Problem List Details Patient Name: Date of Service: BLA Juan [name] MES [name]. 03/11/2022 2:00 PM Juan [name] ([id number]) [id number] Page 3 of 6 Medical Record Number: [id number] Patient Account Number: [id number] Date of Birth/Sex: Treating RN: 20-Jun-1950 (72 y.o. M) Primary Care Provider: [name] Other Clinician: Referring Provider: Treating Provider/Extender: Felipa Eth in Treatment: 1 Active Problems ICD-10 Encounter Code Description Active Date MDM Diagnosis L97.221 Non-pressure chronic ulcer of left calf limited to breakdown of skin 03/04/2022 No Yes I87.2 Venous insufficiency (chronic) (peripheral) 03/04/2022 No Yes Z60.10 Chronic systolic (congestive) heart failure 03/04/2022 No Yes E11.9 Type 2 diabetes mellitus without complications 9/32/3557 No Yes E11.622 Type 2 diabetes mellitus with other skin ulcer 03/04/2022 No Yes Inactive Problems Resolved Problems Electronic Signature(s) Signed: 03/11/2022 2:14:05 PM By: Fredirick Maudlin MD FACS Entered By: Fredirick Maudlin on 03/11/2022 14:14:05 -------------------------------------------------------------------------------- Progress  Note Details Patient Name: Date of Service: BLA Juan [name] MES [name]. 03/11/2022 2:00 PM Medical Record Number: [id number] Patient Account Number: [id number] Date of Birth/Sex: Treating RN: [birth year] (72 y.o. M) Primary Care Provider: [name] Other Clinician: Referring Provider: Treating Provider/Extender: Felipa Eth in Treatment: 1 Subjective Chief Complaint Information obtained from Patient Patient presents for treatment of an open ulcer due to venous insufficiency History of Present Illness (HPI) ADMISSION 03/04/2022 This is a 72 year old newly diagnosed type II diabetic (hemoglobin A1c 7.3%) with congestive heart failure, hypertension, significant ethanol use, recent tobacco cessation and decreased mobility. About 6 weeks ago, he developed an ulcer on his left lower leg. His primary care provider put him in an [place] and referred him to the wound care center. While he was waiting for his appointment, he underwent several Unna boot changes with good improvement in the size and appearance of the wound. There is a large superficial ulcer on the patient's left lower lateral leg. There is some dead dry skin hanging from the edges, but otherwise the wound is clean and limited just to the breakdown of skin. 03/11/2022: His wound is healed. He does have compression stockings on order and according to his wife, they shipped out today from [place]. KHIRY, PASQUARIELLO ([id number]) [id number] Page 4 of 6 Patient History Information obtained from Patient. Family History Diabetes - Mother, Heart Disease - Mother, Lung Disease - Father, No family history of Cancer, Hereditary Spherocytosis, Hypertension, Kidney Disease, Seizures, Stroke, Thyroid Problems, Tuberculosis. Social History Former smoker - quit 1 year ago, Marital Status - Married, Alcohol Use - Daily - beer 24-36 oz a day, wine, Drug Use - No History, Caffeine Use -  Daily. Medical History Eyes Patient has history of Cataracts Respiratory Patient has history of Asthma, Chronic Obstructive Pulmonary Disease (COPD) Cardiovascular Patient has history of Arrhythmia - a-fib, Congestive Heart Failure, Hypertension, Peripheral Venous Disease Denies history of Angina Endocrine Patient has history of Type II Diabetes Neurologic Patient has history of Neuropathy Hospitalization/Surgery History - toenail excision. Medical A Surgical History Notes nd Eyes macular degeneration in right eye Psychiatric anxiety and depression Objective Constitutional no acute distress. Vitals Time Taken: 2:02 PM, Height: 68 in, Weight: 210 lbs, BMI: 31.9, Temperature: 98.3 F, Pulse: 85 bpm, Respiratory Rate: 16 breaths/min, Blood Pressure: 118/68 mmHg. Respiratory Normal work of breathing on room air. General Notes: 03/11/2022: His wound is healed. Integumentary (Hair, Skin) Wound #1 status is Open. Original cause of wound was Gradually Appeared. The  date acquired was: 01/21/2022. The wound has been in treatment 1 weeks. The wound is located on the Left,Lateral Lower Leg. The wound measures 0cm length x 0cm width x 0cm depth; 0cm^2 area and 0cm^3 volume. There is no tunneling or undermining noted. There is a none present amount of drainage noted. The wound margin is flat and intact. There is no granulation within the wound bed. There is no necrotic tissue within the wound bed. The periwound skin appearance had no abnormalities noted for texture. The periwound skin appearance exhibited: Dry/Scaly, Rubor. Periwound temperature was noted as No Abnormality. Assessment Active Problems ICD-10 Non-pressure chronic ulcer of left calf limited to breakdown of skin Venous insufficiency (chronic) (peripheral) Chronic systolic (congestive) heart failure Type 2 diabetes mellitus without complications Type 2 diabetes mellitus with other skin ulcer Plan Discharge From [facility]  Services: Discharge from [facility] - Congratulations!!!! Juan [name], Juan [name] ([id number]) [id number] Page 5 of 6 Edema Control - Lymphedema / SCD / Other: Elevate legs to the level of the heart or above for 30 minutes daily and/or when sitting for 3-4 times a day throughout the day. Avoid standing for long periods of time. Patient to wear own compression stockings every day. Moisturize legs daily. Compression stocking or Garment 20-30 mm/Hg pressure to: 03/11/2022: His wound is healed. He has compression stockings on order, but they have not yet been received. We will apply Tubigrip to his legs to provide some compression. As the stockings were shipped from [place] today, I imagine he will have them in the next 1 to 2 days and he should apply them then. He should continue to elevate his legs is much as possible throughout the day and at night when he is sleeping. We will discharge him from the wound care center. He may follow-up as needed. Electronic Signature(s) Signed: 03/11/2022 2:37:14 PM By: Duanne Guess MD FACS Entered By: Duanne Guess on 03/11/2022 14:37:14 -------------------------------------------------------------------------------- HxROS Details Patient Name: Date of Service: BLA Milas Gain MES [name]. 03/11/2022 2:00 PM Medical Record Number: [id number] Patient Account Number: [id number] Date of Birth/Sex: Treating RN: [birth year] (72 y.o. M) Primary Care Provider: Pearlean Brownie Other Clinician: Referring Provider: Treating Provider/Extender: Clint Lipps in Treatment: 1 Information Obtained From Patient Eyes Medical History: Positive for: Cataracts Past Medical History Notes: macular degeneration in right eye Respiratory Medical History: Positive for: Asthma; Chronic Obstructive Pulmonary Disease (COPD) Cardiovascular Medical History: Positive for: Arrhythmia - a-fib; Congestive Heart Failure;  Hypertension; Peripheral Venous Disease Negative for: Angina Endocrine Medical History: Positive for: Type II Diabetes Time with diabetes: 2 weeks Treated with: Oral agents Neurologic Medical History: Positive for: Neuropathy Psychiatric Medical History: Past Medical History Notes: anxiety and depression HBO Extended History Items Eyes: Juan [name], Juan [name] ([id number]) [id number] Page 6 of 6 Cataracts Immunizations Pneumococcal Vaccine: Received Pneumococcal Vaccination: No Implantable Devices None Hospitalization / Surgery History Type of Hospitalization/Surgery toenail excision Family and Social History Cancer: No; Diabetes: Yes - Mother; Heart Disease: Yes - Mother; Hereditary Spherocytosis: No; Hypertension: No; Kidney Disease: No; Lung Disease: Yes - Father; Seizures: No; Stroke: No; Thyroid Problems: No; Tuberculosis: No; Former smoker - quit 1 year ago; Marital Status - Married; Alcohol Use: Daily - beer 24-36 oz a day, wine; Drug Use: No History; Caffeine Use: Daily; Financial Concerns: No; Food, Clothing or Shelter Needs: No; Support System Lacking: No; Transportation Concerns: No Electronic Signature(s) Signed: 03/11/2022 3:18:38 PM By: Duanne Guess MD FACS Entered By: Duanne Guess on 03/11/2022 14:34:29 -------------------------------------------------------------------------------- SuperBill Details Patient  Name: Date of Service: BLA Juan [name] MES [name]. 03/11/2022 Medical Record Number: [id number] Patient Account Number: [id number] Date of Birth/Sex: Treating RN: May 01, 1950 (72 y.o. [name] Provider: [name] Other Clinician: Referring Provider: Treating Provider/Extender: Felipa Eth in Treatment: 1 Diagnosis Coding ICD-10 Codes Code Description [phone] Non-pressure chronic ulcer of left calf limited to breakdown of skin I87.2 Venous insufficiency (chronic)  (peripheral) S50.53 Chronic systolic (congestive) heart failure E11.9 Type 2 diabetes mellitus without complications Z76.734 Type 2 diabetes mellitus with other skin ulcer Facility Procedures : CPT4 Code: [id number] Description: 99213 - WOUND CARE VISIT-LEV 3 EST PT Modifier: Quantity: 1 Physician Procedures : CPT4 Code Description Modifier [id number] - WC PHYS LEVEL 3 - EST PT ICD-10 Diagnosis Description L97.221 Non-pressure chronic ulcer of left calf limited to breakdown of skin I87.2 Venous insufficiency (chronic) (peripheral) A83.41 Chronic systolic  (congestive) heart failure E11.622 Type 2 diabetes mellitus with other skin ulcer Quantity: 1 Electronic Signature(s) Signed: 03/11/2022 2:37:28 PM By: Fredirick Maudlin MD FACS Entered By: Fredirick Maudlin on 03/11/2022 14:37:28

## 2022-03-12 NOTE — Progress Notes (Signed)
Juan [name], Juan [name] ([id number]) [id number] Page 1 of 8 Visit Report for 03/11/2022 Arrival Information Details Patient Name: Date of Service: [name] 03/11/2022 2:00 PM Medical Record Number: [id number] Patient Account Number: [id number] Date of Birth/Sex: Treating RN: 03/02/1950 (72 y.o. Juan [name] Primary Care Juan [name]: [name] Other Clinician: Referring Juan [name]: Treating Juan [name]: Juan [name] in Treatment: 1 Visit Information History Since Last Visit Added or deleted any medications: No Patient Arrived: Wheel Chair Any new allergies or adverse reactions: No Arrival Time: 13:59 Had a fall or experienced change in No Accompanied By: wife activities of daily living that may affect Transfer Assistance: Manual risk of falls: Patient Identification Verified: Yes Signs or symptoms of abuse/neglect since last visito No Secondary Verification Process Completed: Yes Hospitalized since last visit: No Patient Requires Transmission-Based Precautions: No Implantable device outside of the [name] excluding No Patient Has Alerts: Yes cellular tissue based products placed in the center Patient Alerts: L ABI: Juan [name] since last visit: Has Dressing in Place as Prescribed: Yes Has Compression in Place as Prescribed: Yes Pain Present Now: No Electronic Signature(s) Signed: 03/11/2022 4:26:52 PM By: Juan [name] Entered By: Juan [name] on 03/11/2022 14:00:58 -------------------------------------------------------------------------------- [name] Level of Care Assessment Details Patient Name: Date of Service: [name] 03/11/2022 2:00 PM Medical Record Number: [id number] Patient Account Number: [id number] Date of Birth/Sex: Treating RN: 22-Nov-1950 (72 y.o. Juan [name] Primary Care Juan [name]: [name] Other Clinician: Referring Juan [name]: Treating  Juan [name]/Extender: Juan [name] in Treatment: 1 [name] Level of Care Assessment Items TOOL 4 Quantity Score X- 1 0 Use when only an EandM is performed on FOLLOW-UP visit ASSESSMENTS - Nursing Assessment / Reassessment X- 1 10 Reassessment of Co-morbidities (includes updates in patient status) X- 1 5 Reassessment of Adherence to Treatment Plan ASSESSMENTS - Wound and Skin A ssessment / Reassessment X - Simple Wound Assessment / Reassessment - one wound 1 5 []  - 0 Complex Wound Assessment / Reassessment - multiple wounds []  - 0 Dermatologic / Skin Assessment (not related to wound area) ASSESSMENTS - Focused Assessment X- 1 5 Circumferential Edema Measurements - multi extremities []  - 0 Nutritional Assessment / Counseling / Intervention Juan [name], Juan [name] ([id number]) [id number] Page 2 of 8 X- 1 5 Lower Extremity Assessment (monofilament, tuning fork, pulses) []  - 0 Peripheral Arterial Disease Assessment (using hand held doppler) ASSESSMENTS - Ostomy and/or Continence Assessment and Care []  - 0 Incontinence Assessment and Management []  - 0 Ostomy Care Assessment and Management (repouching, etc.) PROCESS - Coordination of Care X - Simple Patient / Family Education for ongoing care 1 15 []  - 0 Complex (extensive) Patient / Family Education for ongoing care X- 1 10 Staff obtains Programmer, systems, Records, T Results / Process Orders est []  - 0 Staff telephones HHA, Nursing Homes / Clarify orders / etc []  - 0 Routine Transfer to another Facility (non-emergent condition) []  - 0 Routine Hospital Admission (non-emergent condition) []  - 0 New Admissions / Biomedical engineer / Ordering NPWT Apligraf, etc. , []  - 0 Emergency Hospital Admission (emergent condition) X- 1 10 Simple Discharge Coordination []  - 0 Complex (extensive) Discharge Coordination PROCESS - Special Needs []  - 0 Pediatric / Minor Patient Management []   - 0 Isolation Patient Management []  - 0 Hearing / Language / Visual special needs []  - 0 Assessment of Community assistance (transportation, D/C planning, etc.) []  - 0 Additional assistance / Altered mentation []  - 0 Support Surface(s) Assessment (  bed, cushion, seat, etc.) INTERVENTIONS - Wound Cleansing / Measurement X - Simple Wound Cleansing - one wound 1 5 []  - 0 Complex Wound Cleansing - multiple wounds X- 1 5 Wound Imaging (photographs - any number of wounds) []  - 0 Wound Tracing (instead of photographs) []  - 0 Simple Wound Measurement - one wound []  - 0 Complex Wound Measurement - multiple wounds INTERVENTIONS - Wound Dressings X - Small Wound Dressing one or multiple wounds 1 10 []  - 0 Medium Wound Dressing one or multiple wounds []  - 0 Large Wound Dressing one or multiple wounds []  - 0 Application of Medications - topical []  - 0 Application of Medications - injection INTERVENTIONS - Miscellaneous []  - 0 External ear exam []  - 0 Specimen Collection (cultures, biopsies, blood, body fluids, etc.) []  - 0 Specimen(s) / Culture(s) sent or taken to Lab for analysis []  - 0 Patient Transfer (multiple staff / Civil Service fast streamer / Similar devices) []  - 0 Simple Staple / Suture removal (25 or less) []  - 0 Complex Staple / Suture removal (26 or more) []  - 0 Hypo / Hyperglycemic Management (close monitor of Blood Glucose) Juan [name], Juan [name] ([id number]) [id number] Page 3 of 8 []  - 0 Ankle / Brachial Index (ABI) - do not check if billed separately X- 1 5 Vital Signs Has the patient been seen at the hospital within the last three years: Yes Total Score: 90 Level Of Care: New/Established - Level 3 Electronic Signature(s) Signed: 03/11/2022 4:26:52 PM By: Juan [name] Entered By: Juan [name] on 03/11/2022 14:22:17 -------------------------------------------------------------------------------- Encounter Discharge Information  Details Patient Name: Date of Service: [name] 03/11/2022 2:00 PM Medical Record Number: [id number] Patient Account Number: [id number] Date of Birth/Sex: Treating RN: 10-14-1950 (72 y.o. Juan [name] Primary Care Muskan Bolla: [name] Other Clinician: Referring Ngoc Detjen: Treating Celsey Asselin/Extender: Juan [name] in Treatment: 1 Encounter Discharge Information Items Discharge Condition: Stable Ambulatory Status: Ambulatory Discharge Destination: Home Transportation: Private Auto Accompanied By: wife Schedule Follow-up Appointment: No Clinical Summary of Care: Patient Declined Electronic Signature(s) Signed: 03/11/2022 4:26:52 PM By: Juan [name] Entered By: Juan [name] on 03/11/2022 14:22:44 -------------------------------------------------------------------------------- Lower Extremity Assessment Details Patient Name: Date of Service: [name] 03/11/2022 2:00 PM Medical Record Number: [id number] Patient Account Number: [id number] Date of Birth/Sex: Treating RN: January 07, 1951 (73 y.o. Juan [name] Primary Care Naseem Adler: [name] Other Clinician: Referring Desmon Hitchner: Treating Arabell Neria/Extender: Adolphus Birchwood Weeks in Treatment: 1 Edema Assessment Assessed: [Left: No] [Right: No] [Left: Edema] [Right: :] Calf Left: Right: Point of Measurement: From Medial Instep 37.6 cm 40 cm Ankle Left: Right: Point of Measurement: From Medial Instep 23.2 cm 26 cm Vascular Assessment Left: [id number] Page 4 of 8Right:] Pulses: Dorsalis Pedis Palpable: [[id number] Page 4 of 8Yes] Electronic Signature(s) Signed: 03/11/2022 4:26:52 PM By: Juan [name] Entered By: Juan [name] on 03/11/2022 14:06:49 -------------------------------------------------------------------------------- Multi Wound Chart  Details Patient Name: Date of Service: [name] 03/11/2022 2:00 PM Medical Record Number: [id number] Patient Account Number: [id number] Date of Birth/Sex: Treating RN: 1951-01-05 (72 y.o. M) Primary Care Darionna Banke: [name] Other Clinician: Referring Javen Ridings: Treating Julene Rahn/Extender: Juan [name] in Treatment: 1 Vital Signs Height(in): 68 Pulse(bpm): 79 Weight(lbs): 210 Blood Pressure(mmHg): 118/68 Body Mass Index(BMI): 31.9 Temperature(F): 98.3 Respiratory Rate(breaths/min): 16 [1:Photos:] [N/A:N/A] Left, Lateral Lower Leg N/A N/A Wound Location: Gradually Appeared N/A N/A Wounding Event: Venous Leg Ulcer N/A N/A Primary Etiology: Cataracts, Asthma, Chronic N/A N/A Comorbid History: Obstructive  Pulmonary Disease (COPD), Arrhythmia, Congestive Heart Failure, Hypertension, Peripheral Venous Disease, Type II Diabetes, Neuropathy 01/21/2022 N/A N/A Date Acquired: 1 N/A N/A Weeks of Treatment: Open N/A N/A Wound Status: No N/A N/A Wound Recurrence: 0x0x0 N/A N/A Measurements L x W x D (cm) 0 N/A N/A A (cm) : rea 0 N/A N/A Volume (cm) : 100.00% N/A N/A % Reduction in Area: 100.00% N/A N/A % Reduction in Volume: Full Thickness Without Exposed N/A N/A Classification: Support Structures None Present N/A N/A Exudate Amount: Flat and Intact N/A N/A Wound Margin: None Present (0%) N/A N/A Granulation Amount: None Present (0%) N/A N/A Necrotic Amount: Fascia: No N/A N/A Exposed Structures: Fat Layer (Subcutaneous Tissue): No Tendon: No Muscle: No Joint: No Bone: No Large (67-100%) N/A N/A Epithelialization: No Abnormalities Noted N/A N/A Periwound Skin Texture: Dry/Scaly: Yes N/A N/A Periwound Skin Moisture: Rubor: Yes N/A N/A Periwound Skin ColorARGEL, Juan [name] ([id number]) [id number] Page 5 of 8 No Abnormality N/A N/A Temperature: Treatment Notes Electronic  Signature(s) Signed: 03/11/2022 2:14:12 PM By: Fredirick Maudlin MD FACS Entered By: Fredirick Maudlin on 03/11/2022 14:14:12 -------------------------------------------------------------------------------- Multi-Disciplinary Care Plan Details Patient Name: Date of Service: [name] 03/11/2022 2:00 PM Medical Record Number: [id number] Patient Account Number: [id number] Date of Birth/Sex: Treating RN: [birth year] (72 y.o. Juan [name] Primary Care Chanler Schreiter: [name] Other Clinician: Referring Willies Laviolette: Treating Bindu Docter/Extender: Juan [name] in Treatment: 1 Active Inactive Electronic Signature(s) Signed: 03/11/2022 4:26:52 PM By: Sabas Sous By: Juan [name] on 03/11/2022 14:21:43 -------------------------------------------------------------------------------- Pain Assessment Details Patient Name: Date of Service: Juan [name] MES K. 03/11/2022 2:00 PM Medical Record Number: [id number] Patient Account Number: [id number] Date of Birth/Sex: Treating RN: 26-Feb-1950 (72 y.o. Juan [name] Primary Care Krislyn Donnan: [name] Other Clinician: Referring Maybelline Kolarik: Treating Lakrisha Iseman/Extender: Juan [name] in Treatment: 1 Active Problems Location of Pain Severity and Description of Pain Patient Has Paino No Site Locations Rate the pain. Current Pain Level: 0 Juan [name], Juan [name] ([id number]) [id number] Page 6 of 8 Pain Management and Medication Current Pain Management: Electronic Signature(s) Signed: 03/11/2022 4:26:52 PM By: Juan [name] Entered By: Juan [name] on 03/11/2022 14:01:39 -------------------------------------------------------------------------------- Patient/Caregiver Education Details Patient Name: Date of Service: [name] 2/5/2024andnbsp2:00 PM Medical Record Number: [id number] Patient Account Number:  [id number] Date of Birth/Gender: Treating RN: Nov 30, 1950 (72 y.o. Juan [name] Primary Care Physician: [name] Other Clinician: Referring Physician: Treating Physician/Extender: Juan [name] in Treatment: 1 Education Assessment Education Provided To: Patient Education Topics Provided Safety: Methods: Explain/Verbal Responses: Reinforcements needed, State content correctly Electronic Signature(s) Signed: 03/11/2022 4:26:52 PM By: Juan [name] Entered By: Juan [name] on 03/11/2022 14:21:54 -------------------------------------------------------------------------------- Wound Assessment Details Patient Name: Date of Service: [name] 03/11/2022 2:00 PM Medical Record Number: [id number] Patient Account Number: [id number] Date of Birth/Sex: Treating RN: [birth year] (72 y.o. Juan [name] Primary Care [place] Winslow: [name] Other Clinician: Referring Layana Konkel: Treating Chanci Ojala/Extender: Adolphus Birchwood Weeks in Treatment: 1 Wound Status Wound Number: 1 Primary Venous Leg Ulcer Etiology: Wound Location: Left, Lateral Lower Leg Wound Open Wounding Event: Gradually Appeared Status: Date Acquired: 01/21/2022 Comorbid Cataracts, Asthma, Chronic Obstructive Pulmonary Disease Weeks Of Treatment: 1 History: (COPD), Arrhythmia, Congestive Heart Failure, Hypertension, Clustered Wound: No Peripheral Venous Disease, Type II Diabetes, Neuropathy Photos Juan [name], Juan [name] ([id number]) [phone] Page 7 of 8 Wound Measurements Length: (cm) Width: (cm) Depth: (cm) Area: (cm) Volume: (cm) 0 % Reduction in Area: 100%  0 % Reduction in Volume: 100% 0 Epithelialization: Large (67-100%) 0 Tunneling: No 0 Undermining: No Wound Description Classification: Full Thickness Without Exposed Support Structures Wound Margin: Flat and Intact Exudate Amount: None  Present Foul Odor After Cleansing: No Slough/Fibrino No Wound Bed Granulation Amount: None Present (0%) Exposed Structure Necrotic Amount: None Present (0%) Fascia Exposed: No Fat Layer (Subcutaneous Tissue) Exposed: No Tendon Exposed: No Muscle Exposed: No Joint Exposed: No Bone Exposed: No Periwound Skin Texture Texture Color No Abnormalities Noted: Yes No Abnormalities Noted: No Rubor: Yes Moisture No Abnormalities Noted: No Temperature / Pain Dry / Scaly: Yes Temperature: No Abnormality Electronic Signature(s) Signed: 03/11/2022 4:26:52 PM By: [name] Entered By: [name] on 03/11/2022 14:12:19 -------------------------------------------------------------------------------- Vitals Details Patient Name: Date of Service: BLA Milas Gain MES K. 03/11/2022 2:00 PM Medical Record Number: [id number] Patient Account Number: [id number] Date of Birth/Sex: Treating RN: August 14, 1950 (72 y.o. Marlan Palau Primary Care Rajanee Schuelke: Pearlean Brownie Other Clinician: Referring Jishnu Jenniges: Treating Makeba Delcastillo/Extender: Clint Lipps in Treatment: 1 Vital Signs Time Taken: 14:02 Temperature (F): 98.3 Height (in): 68 Pulse (bpm): 85 Weight (lbs): 210 Respiratory Rate (breaths/min): 16 Body Mass Index (BMI): 31.9 Blood Pressure (mmHg): 118/68 Reference Range: 80 - 120 mg / dl Electronic Signature(s) Signed: 03/11/2022 4:26:52 PM By: Dorma Russell, Lowella Petties ([id number]) [id number] Page 8 of 8 Entered By: [name] on 03/11/2022 14:02:50

## 2022-03-14 ENCOUNTER — Other Ambulatory Visit: Payer: Self-pay | Admitting: Family Medicine

## 2022-03-18 ENCOUNTER — Encounter (HOSPITAL_BASED_OUTPATIENT_CLINIC_OR_DEPARTMENT_OTHER): Payer: Medicare HMO | Admitting: Internal Medicine

## 2022-04-02 NOTE — Progress Notes (Deleted)
Cardiology Clinic Note   Patient Name: Juan [name] Date of Encounter: 04/02/2022  Primary Care Provider:  Lind Covert, MD Primary Cardiologist:  Pixie Casino, MD  Patient Profile    Juan [name] 72 year old male presents the clinic today for follow-up evaluation of his chronic systolic CHF and cardiomyopathy.  Past Medical History    Past Medical History:  Diagnosis Date   Alcoholism ([place])    Allergy    seasonal    Anxiety    Asthma    CHF (congestive heart failure) ([place]) 10/2010   ECHO:  EF 40%, Grade II diastolic dysfunction   Chronic diastolic heart failure (HCC)    COPD (chronic obstructive pulmonary disease) (HCC)    COPD exacerbation (HCC)    Depression    Headache    Hyperlipidemia    Hypertension    Lung nodule seen on imaging study 06/15/2013   Needs follow-up in 3 months (early 2017)    RBBB 11/09/2012   Seizures ([place])    Tremor 07/30/2011   Likely secondary to long-term alcohol abuse. Did discuss how his tremors affecting his alcoholism. We also switching her from albuterol to Atrovent in case this is causing any worsening of his tremor.    Umbilical hernia [id number]   Past Surgical History:  Procedure Laterality Date   TOENAIL EXCISION Right 1967   Removal of ingrown toenail [Other]    Allergies  Allergies  Allergen Reactions   Coreg [Carvedilol] Shortness Of Breath    Shortness of breath with rechallenge 10/01/17   Aspartame And Phenylalanine Nausea And Vomiting    History of Present Illness    Juan [name] is a PMH of heavy drinking, chronic systolic CHF, alcoholic cardiomyopathy, chronic hyponatremia, HTN, and HLD.  He presented to [facility] after having seizure like activity.  He was noted to have rhythmic jerking bilaterally for 30 seconds.  He was noted to be unresponsive and postictal for about 30 minutes after the event.  Of note he had a prior episode 2 years before after abstaining from alcohol and  stopping his Klonopin suddenly.  His echocardiogram at that time showed an EF of 30-35%.  He underwent nuclear stress testing with did not show ischemia and it was felt his nonischemic cardiomyopathy was related to EtOH use.  He was fitted for a LifeVest.  On follow-up he had not had any signs of heart failure and and his weight remained stable.  He reported that he stopped drinking alcohol.  His main concern at that time was with Zoloft and Effexor.  He plan to discuss the medications with his psychiatrist.  Follow-up with Dr. Debara Pickett 11/01/2019.  He was seen via telemedicine visit.  He had not been seen since 2016.  During that time he reported history of withdrawal and his wife was hesitant to stop supplying him with alcohol.  He was unable to drive and was having trouble with his vision.  He was unable to leave his house.  He continued to smoke.  He reported a productive cough and shortness of breath with signs of COPD exacerbation.  He reported to follow-up with his PCP in the next 2 days.  He had virtual visit 02/17/2020 with Dr. Debara Pickett.  He reported family illnesses and recent deaths.  He felt his heart failure symptoms were stable.  His weight was 204 pounds.  He denied worsening  swelling.  He noted a chronic persistent cough related to his COPD.  He  denied recent increase in his mucus production.  He denied issues with cramping.  He was taking supplemental magnesium and his potassium was normal.  Follow-up was planned for 6 months.  He presents to the clinic today for follow-up evaluation and states***  *** denies chest pain, shortness of breath, lower extremity edema, fatigue, palpitations, melena, hematuria, hemoptysis, diaphoresis, weakness, presyncope, syncope, orthopnea, and PND.  Chronic systolic CHF, nonischemic cardiomyopathy-weight stable.  NYHA class II.  Echocardiogram 9/21 showed an EF of 30-35%.  Recent lab work stable. Continue furosemide, losartan, metoprolol Heart healthy low-sodium  diet-salty 6 given Increase physical activity as tolerated  Anxiety-mood stable today.  Reports good control with Effexor. Continue current medical therapy Follows with psychiatry  EtOH abuse-history of withdrawal seizure.  Unable to drive.  This has been an ongoing struggle for him.  He continues to work with psychiatry.  COPD-breathing stable.  Chronic cough. Continue current medical therapy Follows with PCP  Disposition: Follow-up with Dr. Debara Pickett in 6-9 months.  Home Medications    Prior to Admission medications   Medication Sig Start Date End Date Taking? Authorizing Provider  aspirin 81 MG chewable tablet Chew 1 tablet (81 mg total) by mouth daily. 12/20/15   Mayo, Pete Pelt, MD  cholecalciferol (VITAMIN D) 1000 units tablet TAKE 1 TABLET BY MOUTH EVERY DAY 11/10/17   Alveda Reasons, MD  clonazePAM (KLONOPIN) 0.5 MG tablet TAKE ONE TABLET BY MOUTH TWICE DAILY 03/14/22   Lind Covert, MD  EPINEPHrine Base (PRIMATENE MIST IN) Inhale 1 puff into the lungs as needed (for breathing).    [provider]  fluticasone (FLOVENT HFA) 220 MCG/ACT inhaler Inhale 1 puff into the lungs 2 (two) times daily. Patient not taking: Reported on 03/05/2022 10/31/21   Lind Covert, MD  furosemide (LASIX) 40 MG tablet TAKE 1 TABLET BY MOUTH EVERY DAY 11/07/20   Hilty, Nadean Corwin, MD  gabapentin (NEURONTIN) 300 MG capsule TAKE 1 CAPSULE BY MOUTH 3 TIMES DAILY AS NEEDED. 08/10/21   Chambliss, Jeb Levering, MD  Ipratropium-Albuterol (COMBIVENT RESPIMAT) 20-100 MCG/ACT AERS respimat INHALE 1 TO 2 PUFFS BY MOUTH EVERY 4 HOURS AS NEEDED FOR WHEEZE PLEASE PROVIDE 2 INHALERS 02/21/22   Salvadore Oxford, MD  losartan (COZAAR) 50 MG tablet TAKE ONE TABLET BY MOUTH ONCE DAILY 02/20/22   Lind Covert, MD  magnesium oxide (MAG-OX) 400 (240 Mg) MG tablet Take 1 tablet by mouth daily. 08/26/20   [provider]  magnesium oxide (MAG-OX) 400 (240 Mg) MG tablet TAKE 1 TABLET BY MOUTH  DAILY. 01/08/21   Pixie Casino, MD  metFORMIN (GLUCOPHAGE-XR) 500 MG 24 hr tablet Take 1 tablet (500 mg total) by mouth daily with breakfast. 02/21/22   Salvadore Oxford, MD  metoprolol succinate (TOPROL-XL) 25 MG 24 hr tablet TAKE 1.5 TABLETS (37.5 MG TOTAL) BY MOUTH DAILY. PLEASE KEEP UPCOMING APPT FOR REFILLS. THANK YOU 12/21/21   Hilty, Nadean Corwin, MD  Multiple Vitamin (MULTIVITAMIN WITH MINERALS) TABS tablet Take 1 tablet by mouth daily as needed (for vitamin).     [provider]  phenylephrine (NEO-SYNEPHRINE) 1 % nasal spray Place 1 drop into both nostrils every 6 (six) hours as needed for congestion (4 way nasal spray).    [provider]  Pseudoeph-Doxylamine-DM-APAP (NYQUIL PO) as needed.    [provider]  sodium chloride (OCEAN) 0.65 % SOLN nasal spray Place 1 spray into both nostrils as needed for congestion.    [provider]  venlafaxine XR (  EFFEXOR-XR) 150 MG 24 hr capsule Take 1 capsule (150 mg total) by mouth daily with breakfast. 02/15/22   Lind Covert, MD  Zoster Vaccine Adjuvanted [facility]) injection Inject 0.5 ml IM and Repeat in 2 months 10/30/21   Lind Covert, MD  albuterol (PROVENTIL,VENTOLIN) 90 MCG/ACT inhaler Inhale 2 puffs into the lungs every 6 (six) hours as needed for wheezing. 01/03/11 04/22/11  Alveda Reasons, MD    Family History    Family History  Problem Relation Age of Onset   Alzheimer's disease Mother    Diabetes Mother    Heart disease Maternal Uncle    He indicated that his mother is deceased. He indicated that his father is deceased. He indicated that the status of his maternal uncle is unknown.  Social History    Social History   Socioeconomic History   Marital status: Married    Spouse name: Not on file   Number of children: Not on file   Years of education: Not on file   Highest education level: Not on file  Occupational History   Occupation: Unemployed  Tobacco Use   Smoking  status: Every Day    Packs/day: 1.50    Years: 40.00    Total pack years: 60.00    Types: Cigarettes   Smokeless tobacco: Never   Tobacco comments:    a little more than 1 ppd (02/17/14)  Vaping Use   Vaping Use: Never used  Substance and Sexual Activity   Alcohol use: Not on file    Comment: 1/2 galloon liquior in 2 days     12/28/2014 wife reports 24oz / day   Drug use: No   Sexual activity: Not Currently  Other Topics Concern   Not on file  Social History Narrative   Lives with wife Juliann Pulse in [place].  She is very involved in his health care   Social Determinants of Health   Financial Resource Strain: Not on file  Food Insecurity: Not on file  Transportation Needs: Not on file  Physical Activity: Not on file  Stress: Stress Concern Present (07/04/2020)   [place]    Feeling of Stress : Very much  Social Connections: Not on file  Intimate Partner Violence: Not on file     Review of Systems    General:  No chills, fever, night sweats or weight changes.  Cardiovascular:  No chest pain, dyspnea on exertion, edema, orthopnea, palpitations, paroxysmal nocturnal dyspnea. Dermatological: No rash, lesions/masses Respiratory: No cough, dyspnea Urologic: No hematuria, dysuria Abdominal:   No nausea, vomiting, diarrhea, bright red blood per rectum, melena, or hematemesis Neurologic:  No visual changes, wkns, changes in mental status. All other systems reviewed and are otherwise negative except as noted above.  Physical Exam    VS:  There were no vitals taken for this visit. , BMI There is no height or weight on file to calculate BMI. GEN: Well nourished, well developed, in no acute distress. HEENT: normal. Neck: Supple, no JVD, carotid bruits, or masses. Cardiac: RRR, no murmurs, rubs, or gallops. No clubbing, cyanosis, edema.  Radials/DP/PT 2+ and equal bilaterally.  Respiratory:  Respirations regular and  unlabored, clear to auscultation bilaterally. GI: Soft, nontender, nondistended, BS + x 4. MS: no deformity or atrophy. Skin: warm and dry, no rash. Neuro:  Strength and sensation are intact. Psych: Normal affect.  Accessory Clinical Findings    Recent Labs: 02/21/2022: ALT 23; BUN 19; Creatinine, Ser  0.99; Hemoglobin 14.7; Platelets 342; Potassium 4.8; Sodium 135   Recent Lipid Panel    Component Value Date/Time   CHOL 158 10/18/2019 0846   TRIG 114 10/18/2019 0846   HDL 40 10/18/2019 0846   CHOLHDL 4.0 10/18/2019 0846   CHOLHDL 4.4 02/13/2016 1446   VLDL 13 02/13/2016 1446   LDLCALC 97 10/18/2019 0846   LDLDIRECT 74 07/20/2012 1124    No BP recorded.  {Refresh Note OR Click here to enter BP  :1}***    ECG personally reviewed by me today- *** - No acute changes  Echocardiogram 10/18/2019 IMPRESSIONS     1. Left ventricular ejection fraction, by estimation, is 30 to 35%. The  left ventricle has moderately decreased function. The left ventricle  demonstrates global hypokinesis. There is mild concentric left ventricular  hypertrophy. Left ventricular  diastolic parameters are indeterminate.   2. Right ventricular systolic function was not well visualized. The right  ventricular size is not well visualized.   3. Left atrial size was mildly dilated.   4. A small pericardial effusion is present. The pericardial effusion is  circumferential.   5. The mitral valve is normal in structure. Trivial mitral valve  regurgitation. No evidence of mitral stenosis.   6. The aortic valve is tricuspid. Aortic valve regurgitation is not  visualized. No aortic stenosis is present.   7. The inferior vena cava is normal in size with greater than 50%  respiratory variability, suggesting right atrial pressure of 3 mmHg.   Comparison(s): No significant change from prior study.   FINDINGS   Left Ventricle: Left ventricular ejection fraction, by estimation, is 30  to 35%. The left ventricle  has moderately decreased function. The left  ventricle demonstrates global hypokinesis. The left ventricular internal  cavity size was normal in size.  There is mild concentric left ventricular hypertrophy. Left ventricular  diastolic parameters are indeterminate.   Right Ventricle: The right ventricular size is not well visualized. Right  vetricular wall thickness was not well visualized. Right ventricular  systolic function was not well visualized.   Left Atrium: Left atrial size was mildly dilated.   Right Atrium: Right atrial size was normal in size.   Pericardium: A small pericardial effusion is present. The pericardial  effusion is circumferential.   Mitral Valve: The mitral valve is normal in structure. Trivial mitral  valve regurgitation. No evidence of mitral valve stenosis.   Tricuspid Valve: The tricuspid valve is normal in structure. Tricuspid  valve regurgitation is trivial. No evidence of tricuspid stenosis.   Aortic Valve: The aortic valve is tricuspid. Aortic valve regurgitation is  not visualized. No aortic stenosis is present.   Pulmonic Valve: The pulmonic valve was not well visualized. Pulmonic valve  regurgitation is not visualized. No evidence of pulmonic stenosis.   Aorta: The aortic root and ascending aorta are structurally normal, with  no evidence of dilitation.   Venous: The inferior vena cava is normal in size with greater than 50%  respiratory variability, suggesting right atrial pressure of 3 mmHg.   IAS/Shunts: The atrial septum is grossly normal.    Assessment & Plan   1.  ***   Jossie Ng. Shanty Ginty NP-C     04/02/2022, 6:15 PM [place] [address] Office [phone] Fax [phone]    I spent***minutes examining this patient, reviewing medications, and using patient centered shared decision making involving her cardiac care.  Prior to her visit I spent greater than 20  minutes reviewing her  past medical history,  medications, and prior cardiac tests.

## 2022-04-05 ENCOUNTER — Ambulatory Visit: Payer: Medicare HMO | Admitting: General Practice

## 2022-04-29 ENCOUNTER — Telehealth: Payer: Self-pay | Admitting: Internal Medicine

## 2022-04-29 ENCOUNTER — Other Ambulatory Visit: Payer: Self-pay

## 2022-04-29 DIAGNOSIS — I10 Essential (primary) hypertension: Secondary | ICD-10-CM

## 2022-04-29 MED ORDER — METOPROLOL SUCCINATE ER 25 MG PO TB24: 37.5000 mg | ORAL_TABLET | Freq: Every day | ORAL | 1 refills | Status: DC

## 2022-04-29 NOTE — Telephone Encounter (Signed)
*  STAT* If patient is at the pharmacy, call can be transferred to refill team.   1. Which medications need to be refilled? (please list name of each medication and dose if known)  metoprolol succinate (TOPROL-XL) 25 MG 24 hr tablet  2. Which pharmacy/location (including street and city if local pharmacy) is medication to be sent to? [place], [place], [place] [address]  3. Do they need a 30 day or 90 day supply?  30 day supply  Patient's wife states patient is completely out of medication.

## 2022-05-14 ENCOUNTER — Other Ambulatory Visit: Payer: Self-pay | Admitting: Family Medicine

## 2022-05-14 DIAGNOSIS — J479 Bronchiectasis, uncomplicated: Secondary | ICD-10-CM

## 2022-05-17 ENCOUNTER — Ambulatory Visit: Payer: Medicare HMO | Admitting: General Practice

## 2022-05-17 ENCOUNTER — Telehealth: Payer: Self-pay | Admitting: Family Medicine

## 2022-05-17 NOTE — Telephone Encounter (Signed)
Called patient to schedule Medicare Annual Wellness Visit (AWV). Left message for patient to call back and schedule Medicare Annual Wellness Visit (AWV).  Last date of AWV: AWV eligible as of 02/04/2018  Please schedule an AWVI appointment at any time with [facility] VISIT.  If any questions, please contact me at [phone].    Thank you,  [facility] Direct dial  [phone]

## 2022-05-20 ENCOUNTER — Ambulatory Visit: Payer: Medicare HMO | Admitting: Student

## 2022-05-28 ENCOUNTER — Ambulatory Visit: Payer: Medicare HMO | Admitting: Family Medicine

## 2022-06-11 ENCOUNTER — Ambulatory Visit: Payer: Medicare HMO | Admitting: Family Medicine

## 2022-06-23 NOTE — Progress Notes (Deleted)
   Cardiology Clinic Note   Date: 06/23/2022 ID: MIRANDA MCELHINNY, DOB 1950-08-03, MRN [id number]  Primary Cardiologist:  Chrystie Nose, MD  Patient Profile    DERVIN FICHERA is a 72 y.o. male who presents to the clinic today for ***  Past medical history significant for: Chronic systolic heart failure. Echo 10/18/2019: EF 30 to 35%.  Global hypokinesis.  Mild concentric LVH.  Mild LAE.  Small pericardial effusion.  Trivial MR. PAF. Not on anticoagulation secondary to alcohol use. Hypertension. T2DM. Alcohol use. Tobacco abuse. COPD.    History of Present Illness    EMILE MENTION was first evaluated by Dr. [name] on 11/24/2012 for hospital follow-up.  Patient underwent hospital admission for alcohol withdrawal and seizure activity after abstaining from alcohol and stopping Klonopin suddenly.  He had a similar episode 2 years prior.  Echo showed severe cardiomyopathy with global hypokinesis and an EF of 30 to 35%.  Nuclear stress testing did not show any signs of ischemia therefore it was felt heart failure with nonischemic cardiomyopathy possibly related to alcohol use.  He had a LifeVest placed secondary to episodes of nonsustained ventricular tachycardia possibly due to metabolic derangement.  At office visit he had no further signs of heart failure and no shocks from LifeVest.  Follow-up echo 09/23/2013 showed normalized EF (LifeVest removed) which remained in the normal range until August 2019 when a drop back to 30 to 35%.  Patient was last seen via virtual visit by Dr. [name] on 02/17/2020 for follow-up.  He was stable from a cardiac standpoint and no medication changes were made.  Today, patient ***  Chronic systolic heart failure.  Echo September 2021 showed EF 30 to 35%.  Patient*** Euvolemic and well compensated on exam.  Continue metoprolol, losartan, Lasix. PAF.  Patient*** Continue metoprolol, aspirin, magnesium. Hypertension. BP today *** Patient denies headaches,  dizziness or vision changes. Continue metoprolol and losartan. Alcohol use.  Patient*** Tobacco abuse.  Patient***   ROS: All other systems reviewed and are otherwise negative except as noted in History of Present Illness.  Studies Reviewed    ECG personally reviewed by me today: ***  No significant changes from ***  Risk Assessment/Calculations    {Does this patient have ATRIAL FIBRILLATION?[phone]} No BP recorded.  {Refresh Note OR Click here to enter BP  :1}***        Physical Exam    VS:  There were no vitals taken for this visit. , BMI There is no height or weight on file to calculate BMI.  GEN: Well nourished, well developed, in no acute distress. Neck: No JVD or carotid bruits. Cardiac: *** RRR. No murmurs. No rubs or gallops.   Respiratory:  Respirations regular and unlabored. Clear to auscultation without rales, wheezing or rhonchi. GI: Soft, nontender, nondistended. Extremities: Radials/DP/PT 2+ and equal bilaterally. No clubbing or cyanosis. No edema ***  Skin: Warm and dry, no rash. Neuro: Strength intact.  Assessment & Plan   ***  Disposition: ***     {Are you ordering a CV Procedure (e.g. stress test, cath, DCCV, TEE, etc)?   Press F2        :[id number]}   Signed, Etta Grandchild. Ajeenah Heiny, DNP, NP-C

## 2022-06-25 ENCOUNTER — Ambulatory Visit: Payer: Medicare HMO | Admitting: Student

## 2022-07-02 ENCOUNTER — Ambulatory Visit: Payer: Medicare HMO | Admitting: Family Medicine

## 2022-07-02 NOTE — Progress Notes (Deleted)
SUBJECTIVE:   CHIEF COMPLAINT / HPI:   Leg Ulcer  Seeing wound care.  He feels is improving.  No fever or discharge.  Currently wrapped by wound care yesterday   Hypertension Did not bring in his medications and can't recite them.  Feels he is taking.      Diabetes Started metformin last week without problems.  Trying to modify his diet   Alcohol  Drinks about 36 ounces of beer and some wine daily.  No change in last 6-12 months.  Would like to cut down   History of Afib He is making an appointment to follow up with cardiology    COPD Stopped smoking a year or so ago.  Not using flovent only combivent with primatene mist sometime.      PERTINENT  PMH / PSH: *** Patient Active Problem List   Diagnosis Date Noted   Adjustment disorder with depressed mood 10/20/2017    Priority: High   Venous stasis ulcer of left calf limited to breakdown of skin without varicose veins (HCC) 02/18/2022   New onset type 2 diabetes mellitus (HCC) 02/18/2022   Mobility impaired 10/31/2021   Macular hole 09/21/2020   Paroxysmal atrial fibrillation (HCC) 11/18/2017   Cataract 06/26/2017   Alcohol use disorder, severe, dependence (HCC) 11/14/2014   Atrial fibrillation with RVR (HCC) 11/14/2014   Peripheral neuropathy 12/15/2013   Bronchiectasis without acute exacerbation (HCC) 06/30/2013   Chronic systolic heart failure (HCC) 06/13/2013   Chronic hyponatremia 11/08/2012   Umbilical hernia 08/06/2012   Tobacco abuse 07/22/2012   HTN (hypertension) 07/30/2011   Hepatomegaly 11/05/2010    Current Outpatient Medications  Medication Instructions   aspirin 81 mg, Oral, Daily   cholecalciferol (VITAMIN D) 1000 units tablet TAKE 1 TABLET BY MOUTH EVERY DAY   clonazePAM (KLONOPIN) 0.5 mg, Oral, 2 times daily   EPINEPHrine Base (PRIMATENE MIST IN) 1 puff, Inhalation, As needed   fluticasone (FLOVENT HFA) 220 MCG/ACT inhaler 1 puff, Inhalation, 2 times daily   furosemide (LASIX) 40 MG tablet  TAKE 1 TABLET BY MOUTH EVERY DAY   gabapentin (NEURONTIN) 300 MG capsule TAKE 1 CAPSULE BY MOUTH 3 TIMES DAILY AS NEEDED.   Ipratropium-Albuterol (COMBIVENT RESPIMAT) 20-100 MCG/ACT AERS respimat INHALE 1-2 PUFFS EVERY 4 HOURS AS NEEDED   losartan (COZAAR) 50 mg, Oral, Daily   magnesium oxide (MAG-OX) 400 (240 Mg) MG tablet 1 tablet, Oral, Daily   magnesium oxide (MAG-OX) 400 (240 Mg) MG tablet TAKE 1 TABLET BY MOUTH DAILY.   metFORMIN (GLUCOPHAGE-XR) 500 mg, Oral, Daily with breakfast   metoprolol succinate (TOPROL-XL) 37.5 mg, Oral, Daily, Please keep upcoming appt for refills. Thank you   Multiple Vitamin (MULTIVITAMIN WITH MINERALS) TABS tablet 1 tablet, Oral, Daily PRN   phenylephrine (NEO-SYNEPHRINE) 1 % nasal spray 1 drop, Each Nare, Every 6 hours PRN   Pseudoeph-Doxylamine-DM-APAP (NYQUIL PO) As needed   sodium chloride (OCEAN) 0.65 % SOLN nasal spray 1 spray, Each Nare, As needed   venlafaxine XR (EFFEXOR-XR) 150 mg, Oral, Daily with breakfast   Zoster Vaccine Adjuvanted [facility]) injection Inject 0.5 ml IM and Repeat in 2 months       03/05/2022    9:11 AM 02/27/2022    9:19 AM 02/21/2022    1:33 PM  Vitals with BMI  Height 5\' 6"     Weight 207 lbs 6 oz 207 lbs 6 oz   BMI 33.49 33.49   Systolic 114 104 161  Diastolic 66 62 66  Pulse  89 77 60      OBJECTIVE:   There were no vitals taken for this visit.  ***  ASSESSMENT/PLAN:   New onset type 2 diabetes mellitus (HCC)     There are no Patient Instructions on file for this visit.   Carney Living, MD [facility]

## 2022-07-03 ENCOUNTER — Encounter: Payer: Self-pay | Admitting: Family Medicine

## 2022-07-03 DIAGNOSIS — I872 Venous insufficiency (chronic) (peripheral): Secondary | ICD-10-CM

## 2022-07-05 ENCOUNTER — Telehealth: Payer: Self-pay | Admitting: Internal Medicine

## 2022-07-05 MED ORDER — FUROSEMIDE 40 MG PO TABS: 40.0000 mg | ORAL_TABLET | Freq: Every day | ORAL | 0 refills | Status: DC

## 2022-07-05 MED ORDER — MAGNESIUM OXIDE -MG SUPPLEMENT 400 (240 MG) MG PO TABS: 1.0000 | ORAL_TABLET | Freq: Every day | ORAL | 0 refills | Status: DC

## 2022-07-05 NOTE — Telephone Encounter (Signed)
Pt's medication was sent to pt's pharmacy as requested. Confirmation received.  °

## 2022-07-05 NOTE — Telephone Encounter (Signed)
*  STAT* If patient is at the pharmacy, call can be transferred to refill team.   1. Which medications need to be refilled? (please list name of each medication and dose if known)    furosemide (LASIX) 40 MG tablet    magnesium oxide (MAG-OX) 400 (240 Mg) MG tablet    2. Which pharmacy/location (including street and city if local pharmacy) is medication to be sent to?  [facility] - , [place] - [address]    3. Do they need a 30 day or 90 day supply? 90

## 2022-07-09 ENCOUNTER — Ambulatory Visit (INDEPENDENT_AMBULATORY_CARE_PROVIDER_SITE_OTHER): Payer: Medicare HMO | Admitting: Family Medicine

## 2022-07-09 ENCOUNTER — Other Ambulatory Visit: Payer: Self-pay | Admitting: Family Medicine

## 2022-07-09 ENCOUNTER — Encounter: Payer: Self-pay | Admitting: Family Medicine

## 2022-07-09 ENCOUNTER — Other Ambulatory Visit: Payer: Self-pay

## 2022-07-09 VITALS — HR 83

## 2022-07-09 DIAGNOSIS — E119 Type 2 diabetes mellitus without complications: Secondary | ICD-10-CM | POA: Diagnosis not present

## 2022-07-09 DIAGNOSIS — F1020 Alcohol dependence, uncomplicated: Secondary | ICD-10-CM | POA: Diagnosis not present

## 2022-07-09 DIAGNOSIS — L97221 Non-pressure chronic ulcer of left calf limited to breakdown of skin: Secondary | ICD-10-CM | POA: Diagnosis not present

## 2022-07-09 DIAGNOSIS — I872 Venous insufficiency (chronic) (peripheral): Secondary | ICD-10-CM | POA: Diagnosis not present

## 2022-07-09 DIAGNOSIS — I4891 Unspecified atrial fibrillation: Secondary | ICD-10-CM | POA: Diagnosis not present

## 2022-07-09 DIAGNOSIS — J479 Bronchiectasis, uncomplicated: Secondary | ICD-10-CM

## 2022-07-09 LAB — POCT GLYCOSYLATED HEMOGLOBIN (HGB A1C): HbA1c, POC (controlled diabetic range): 7.1 % — AB (ref 0.0–7.0)

## 2022-07-09 MED ORDER — HYDROCORTISONE 2.5 % EX OINT
TOPICAL_OINTMENT | Freq: Two times a day (BID) | CUTANEOUS | 4 refills | Status: AC
Start: 2022-07-09 — End: ?

## 2022-07-09 NOTE — Assessment & Plan Note (Signed)
He states that he only drinks one 24 oz beer a week as his wife limits him to drinking them on Saturday only as a new rule. Advised to continue decreasing alcohol intake and commit to his new regimen.

## 2022-07-09 NOTE — Assessment & Plan Note (Addendum)
Likely a venous stasis ulcer. Will obtain CBC, CMP. Advised to cover with steroid ointment TWICE DAILY and leave it exposed to air, follow up with wound care which is scheduled in late June, they have advised that if any cancellations arise they will fit him in. Gave return precautions if this worsens.

## 2022-07-09 NOTE — Assessment & Plan Note (Signed)
His decreased heart rate for 1-2 minutes could be from metoprolol in context of Afib. Advised to take 1/2 tablet of metoprolol. Follow up with cardiology in a couple weeks. Gave return precautions in case his heart rate sustains a low rate or he becomes symptomatic during a bradycardic episode.

## 2022-07-09 NOTE — Progress Notes (Signed)
Date of Visit: 07/09/2022   SUBJECTIVE:   HPI:  Juan [name]" presents today for recheck of his diabetes.  Diabetes Patient says that he has not been checking his blood sugar at home. His wife says that he does not take medications on a good regimen or at the same time each day. He is mostly compliant, but sometimes misses medications.  Leg Ulcer  Appointment scheduled for end of June and may get in sooner if they have any cancellations. He has had a darkened red/purple lower left extremity mostly circumferentially chronically. The patient has developed an open sore that is weeping fluid and smells foul over the past 3 weeks. His wife has been dressing this wound and it was last dressed 6 days ago. This wound is in a similar location to his prior, which warranted unna boot and wound care.   Hypertension His blood pressure measured 160 systolically last night. His wife notes that his heart rate decreased into the 40's when measuring his O2 sat last night, then rose back into the 80's to 90's. His O2 Saturation was 90% on room air. The patient has missed his recent cardiology appointments due to feeling weak.  COPD  Respiratory Juan [name] complains of having a post nasal drip for one month, worse at night and causing coughing. He also has had sneezing, congestion, rhinorrhea for 2 months. Juan [name] notes a history of seasonal allergies, but says that this year they have been worsen than usual. He has been taking mucinex and using an epinephrine nasal spray without alleviation and has used claritin daily for a couple months. The patient feels that his COPD is not well controlled. He has not been using his daily fluticasone inhaler as he read that oral yeast could be a side effect. His wife says that he uses around 5 inhalers a month, finishing his albuterol inhalers before recommended and then using OTC inhaler.   Alcohol Intake He states that he only drinks one 24 oz beer a week as his wife limits him to  drinking them on Saturday only as a new rule. He has not had two consecutive beers in a couple of weeks now. No symptoms of withdrawal present.   OBJECTIVE:   BP 126/77   Pulse 83   SpO2 92%  Gen: Well nourished Heart: Normal rate Lungs: Clear to auscultation bilaterally posteriorly Neuro: Moves all four extremities spontaneously Ext: Left lower extremity has 9 inches of anterior redness, flaking of skin, and to the distal anteromedial portion he has a superficial wound that has foul odor. There is yellow drainage on his dressing that was taken off, but no grossly visible purulence or drainage from the wound. The erythema spans circumferentially more on the distal part of the lower extremity. See photos of left lower extremity below. Patient is in a wheelchair at this time.      ASSESSMENT/PLAN:   Atrial fibrillation with RVR (HCC) His decreased heart rate for 1-2 minutes could be from metoprolol in context of Afib. Advised to take 1/2 tablet of metoprolol. Follow up with cardiology in a couple weeks. Gave return precautions in case his heart rate sustains a low rate or he becomes symptomatic during a bradycardic episode.  New onset type 2 diabetes mellitus (HCC) A1c improved to 7.1% today. Continue medication and diet regimen.  Alcohol use disorder, severe, dependence (HCC) He states that he only drinks one 24 oz beer a week as his wife limits him to drinking them on Saturday only as a  new rule. Advised to continue decreasing alcohol intake and commit to his new regimen.  Venous stasis ulcer of left calf limited to breakdown of skin without varicose veins (HCC) Likely a venous stasis ulcer. Will obtain CBC, CMP. Advised to cover with steroid ointment TWICE DAILY and leave it exposed to air, follow up with wound care which is scheduled in late June, they have advised that if any cancellations arise they will fit him in. Gave return precautions if this worsens.  Bronchiectasis without  acute exacerbation [facility]) Patient has not been using his steroid inhaler as he was concerned that yeast could arise. Advised to use fluticasone/Flovent inhaler twice a day each day and rinse your mouth with water afterwards. Hopefully this will decrease his symptoms, if not we will then adjust his daily inhalers after compliance to current regimen has been met and shown to not be sufficient.  FOLLOW UP: Follow up in 2 weeks for recheck  Kayleen Memos [facility]  I was present during key history taking and physical exam and any procedures.  I agree with the note above Pauline Good MD

## 2022-07-09 NOTE — Assessment & Plan Note (Signed)
A1c improved to 7.1% today. Continue medication and diet regimen.

## 2022-07-09 NOTE — Patient Instructions (Addendum)
Good to see you today - Thank you for coming in  Things we discussed today:  Heart rate: Take 1/2 tablet of metoprolol.  Discuss with your cardiologist.  Call me if your HR is too high or too low  Diabetes: Your blood sugar is doing well  Alcohol: Please continue decreasing alcohol intake  Leg Wound: Cover with ointment TWICE DAILY and leave it exposed to air, follow up with wound care. Call me if pus-like discharge or increasing redness or fever   COPD: Use your fluticasone/Flovent inhaler twice a day each day as prescribed and rinse your mouth with water afterwards.  I will call you if your tests are not good.  Otherwise, I will send you a message on MyChart (if it is active) or a letter in the mail..  If you do not hear from me with in 2 weeks please call our office.     You would likely benefit from Colon cancer, lung cancer screening, Eye exam Please consider these tests   Please always bring your medication bottles  Come back to see me in 2 weeks

## 2022-07-09 NOTE — Assessment & Plan Note (Addendum)
Patient has not been using his steroid inhaler as he was concerned that yeast could arise. Advised to use fluticasone/Flovent inhaler twice a day each day and rinse your mouth with water afterwards. Hopefully this will decrease his symptoms, if not we will then adjust his daily inhalers after compliance to current regimen has been met and shown to not be sufficient.

## 2022-07-09 NOTE — Progress Notes (Signed)
    Carney Living, MD [facility]

## 2022-07-10 LAB — CBC
Hematocrit: 44.5 % (ref 37.5–51.0)
Hemoglobin: 15.2 g/dL (ref 13.0–17.7)
MCH: 30.4 pg (ref 26.6–33.0)
MCHC: 34.2 g/dL (ref 31.5–35.7)
MCV: 89 fL (ref 79–97)
Platelets: 304 10*3/uL (ref 150–450)
RBC: 5 x10E6/uL (ref 4.14–5.80)
RDW: 12 % (ref 11.6–15.4)
WBC: 11.6 10*3/uL — ABNORMAL HIGH (ref 3.4–10.8)

## 2022-07-10 LAB — CMP14+EGFR
ALT: 26 IU/L (ref 0–44)
AST: 20 IU/L (ref 0–40)
Albumin/Globulin Ratio: 1.2 (ref 1.2–2.2)
Albumin: 4.2 g/dL (ref 3.9–4.9)
Alkaline Phosphatase: 92 IU/L (ref 44–121)
BUN/Creatinine Ratio: 18 (ref 10–24)
BUN: 18 mg/dL (ref 8–27)
Bilirubin Total: 0.3 mg/dL (ref 0.0–1.2)
CO2: 21 mmol/L (ref 20–29)
Calcium: 9.3 mg/dL (ref 8.6–10.2)
Chloride: 99 mmol/L (ref 96–106)
Creatinine, Ser: 0.99 mg/dL (ref 0.76–1.27)
Globulin, Total: 3.5 g/dL (ref 1.5–4.5)
Glucose: 152 mg/dL — ABNORMAL HIGH (ref 70–99)
Potassium: 4.3 mmol/L (ref 3.5–5.2)
Sodium: 137 mmol/L (ref 134–144)
Total Protein: 7.7 g/dL (ref 6.0–8.5)
eGFR: 82 mL/min/{1.73_m2} (ref >59)

## 2022-07-15 ENCOUNTER — Other Ambulatory Visit: Payer: Self-pay | Admitting: Family Medicine

## 2022-07-20 ENCOUNTER — Other Ambulatory Visit: Payer: Self-pay | Admitting: Internal Medicine

## 2022-07-23 ENCOUNTER — Ambulatory Visit: Payer: Medicare HMO | Admitting: Family Medicine

## 2022-07-24 ENCOUNTER — Encounter: Payer: Self-pay | Admitting: Family Medicine

## 2022-07-24 ENCOUNTER — Other Ambulatory Visit: Payer: Self-pay

## 2022-07-24 ENCOUNTER — Ambulatory Visit (INDEPENDENT_AMBULATORY_CARE_PROVIDER_SITE_OTHER): Payer: Medicare HMO | Admitting: Family Medicine

## 2022-07-24 VITALS — BP 130/68 | HR 93 | Ht 66.0 in | Wt 194.2 lb

## 2022-07-24 DIAGNOSIS — I4891 Unspecified atrial fibrillation: Secondary | ICD-10-CM

## 2022-07-24 DIAGNOSIS — L97221 Non-pressure chronic ulcer of left calf limited to breakdown of skin: Secondary | ICD-10-CM | POA: Diagnosis not present

## 2022-07-24 DIAGNOSIS — F4321 Adjustment disorder with depressed mood: Secondary | ICD-10-CM

## 2022-07-24 DIAGNOSIS — I872 Venous insufficiency (chronic) (peripheral): Secondary | ICD-10-CM

## 2022-07-24 DIAGNOSIS — J479 Bronchiectasis, uncomplicated: Secondary | ICD-10-CM | POA: Diagnosis not present

## 2022-07-24 DIAGNOSIS — F1020 Alcohol dependence, uncomplicated: Secondary | ICD-10-CM | POA: Diagnosis not present

## 2022-07-24 NOTE — Assessment & Plan Note (Signed)
Improved with hydrocortisone ointment.  Asked to try to use twice a day and continue elevation

## 2022-07-24 NOTE — Assessment & Plan Note (Signed)
Seems to be tolerating reduced dose of metoprolol without significant bradycardia or tachycardia.  Continue this dose and monitoring at home.  Has cardiology appointment soon.  Wonder since he has markedly decreased his alcohol intake if he would be a DOAC candidate now?

## 2022-07-24 NOTE — Patient Instructions (Signed)
Good to see you today - Thank you for coming in  Things we discussed today:  Breathing Take Flovent Fluticasone twice a day   Legs Use the hydrocortisone ointment twice a day and vaseline in between to keep moist  Try Miralax one scoop a day for constipation  Let me know if your nasal congestion is not getting better  Consider physical therapy to get stronger  Consider lung cancer screen and colonoscopy to detect any cancer  Please always bring your medication bottles  Come back to see me in 3 months

## 2022-07-24 NOTE — Assessment & Plan Note (Signed)
By his report he has markedly reduced his alcohol intake.  His LFTs are normal.  Encouraged to continue.  Continue gabapentin

## 2022-07-24 NOTE — Progress Notes (Signed)
    SUBJECTIVE:   CHIEF COMPLAINT / HPI:   Venous stasis ulcer of left calf limited to breakdown of skin without varicose veins (HCC) He feels this is better.  Only using HC once daily.  Did get dry and stuck to pillow and tore small piece of skin when moved but overall leaking is much better  Atrial fibrillation with RVR (HCC) Has been taking 1/2 of metoprolol instead of 1.5 tabs as instructed at last visit due to history of low heart rate.  He has not noted any low HR on home monitoring or any tachycardia or lightheadness   Alcohol use disorder, severe, dependence (HCC) Reports drank 2 beers over the last week.  He does think this is better for his health but would like to drink more if his wife would supply it.   Bronchiectasis without acute exacerbation (HCC) Using flovent now regularly but only once a day.   Feels he is limited in his movements due to shortness of breath   Nasal Congestion - had been bothering him for last few weeks but seems better today.  Taking an OTC anthistamine not sure of the name  OBJECTIVE:   BP 130/68   Pulse 93   Ht 5\' 6"  (1.676 m)   Wt 194 lb 3.2 oz (88.1 kg)   SpO2 95%   BMI 31.34 kg/m   Sitting in WC H - RRR with skipped beats every 5-10th Lungs - decreased sounds througout no wheezing Calf  Much less redness and leakage than last visit  ASSESSMENT/PLAN:   Atrial fibrillation with RVR (HCC) Assessment & Plan: Seems to be tolerating reduced dose of metoprolol without significant bradycardia or tachycardia.  Continue this dose and monitoring at home.  Has cardiology appointment soon.  Wonder since he has markedly decreased his alcohol intake if he would be a DOAC candidate now?    Venous stasis ulcer of left calf limited to breakdown of skin without varicose veins (HCC) Assessment & Plan: Improved with hydrocortisone ointment.  Asked to try to use twice a day and continue elevation   Alcohol use disorder, severe, dependence  (HCC) Assessment & Plan: By his report he has markedly reduced his alcohol intake.  His LFTs are normal.  Encouraged to continue.  Continue gabapentin    Adjustment disorder with depressed mood Assessment & Plan: Stable Continue effexor and klonopin.   Consider weaning klonopin very slowly    Bronchiectasis without acute exacerbation [facility]) Assessment & Plan: His lung disease greatly limits his mobility.  He is not currently interested in pulmonary Physical Therapy rehab.  Relates he will consider in future      Patient Instructions  Good to see you today - Thank you for coming in  Things we discussed today:  Breathing Take Flovent Fluticasone twice a day   Legs Use the hydrocortisone ointment twice a day and vaseline in between to keep moist  Try Miralax one scoop a day for constipation  Let me know if your nasal congestion is not getting better  Consider physical therapy to get stronger  Consider lung cancer screen and colonoscopy to detect any cancer  Please always bring your medication bottles  Come back to see me in 3 months   Carney Living, MD [facility]

## 2022-07-24 NOTE — Assessment & Plan Note (Signed)
His lung disease greatly limits his mobility.  He is not currently interested in pulmonary Physical Therapy rehab.  Relates he will consider in future

## 2022-07-24 NOTE — Assessment & Plan Note (Signed)
Stable Continue effexor and klonopin.   Consider weaning klonopin very slowly

## 2022-07-29 ENCOUNTER — Other Ambulatory Visit (HOSPITAL_COMMUNITY): Payer: Self-pay

## 2022-07-29 NOTE — Progress Notes (Deleted)
   Cardiology Clinic Note   Date: 07/29/2022 ID: Juan, [name] [birth year], MRN [id number]  Primary Cardiologist:  Chrystie Nose, MD  Patient Profile    Juan [name] is a 72 y.o. male who presents to the clinic today for ***    Past medical history significant for: Chronic systolic heart failure. Echo 10/18/2019: EF 30 to 35%.  Global hypokinesis.  Mild concentric LVH.  Mild LAE.  Small pericardial effusion.  Trivial MR. PAF. Not on anticoagulation secondary to alcohol use. Hypertension. T2DM. Alcohol use. Tobacco abuse. COPD.      History of Present Illness    Juan [name] was first evaluated by Dr. [name] on 11/24/2012 for hospital follow-up.  Patient underwent hospital admission for alcohol withdrawal and seizure activity after abstaining from alcohol and stopping Klonopin suddenly.  He had a similar episode 2 years prior.  Echo showed severe cardiomyopathy with global hypokinesis and an EF of 30 to 35%.  Nuclear stress testing did not show any signs of ischemia therefore it was felt heart failure with nonischemic cardiomyopathy possibly related to alcohol use.  He had a LifeVest placed secondary to episodes of nonsustained ventricular tachycardia possibly due to metabolic derangement.  At office visit he had no further signs of heart failure and no shocks from LifeVest.  Follow-up echo 09/23/2013 showed normalized EF (LifeVest removed) which remained in the normal range until August 2019 when a drop back to 30 to 35%.   Patient was last seen via virtual visit by Dr. [name] on 02/17/2020 for follow-up.  He was stable from a cardiac standpoint and no medication changes were made.  Today, patient ***   Chronic systolic heart failure.  Echo September 2021 showed EF 30 to 35%.  Patient*** Euvolemic and well compensated on exam.  Continue metoprolol, losartan, Lasix. PAF.  Patient*** Continue metoprolol, aspirin, magnesium. Hypertension. BP today *** Patient denies  headaches, dizziness or vision changes. Continue metoprolol and losartan. Alcohol use.  Patient*** Tobacco abuse.  Patient***  ROS: All other systems reviewed and are otherwise negative except as noted in History of Present Illness.  Studies Reviewed       ***  Risk Assessment/Calculations    {Does this patient have ATRIAL FIBRILLATION?:[id number]} No BP recorded.  {Refresh Note OR Click here to enter BP  :1}***        Physical Exam    VS:  There were no vitals taken for this visit. , BMI There is no height or weight on file to calculate BMI.  GEN: Well nourished, well developed, in no acute distress. Neck: No JVD or carotid bruits. Cardiac: *** RRR. No murmurs. No rubs or gallops.   Respiratory:  Respirations regular and unlabored. Clear to auscultation without rales, wheezing or rhonchi. GI: Soft, nontender, nondistended. Extremities: Radials/DP/PT 2+ and equal bilaterally. No clubbing or cyanosis. No edema ***  Skin: Warm and dry, no rash. Neuro: Strength intact.  Assessment & Plan   ***  Disposition: ***     {Are you ordering a CV Procedure (e.g. stress test, cath, DCCV, TEE, etc)?   Press F2        :[id number]}   Signed, Etta Grandchild. Delaila Nand, DNP, NP-C

## 2022-07-30 ENCOUNTER — Telehealth: Payer: Self-pay | Admitting: Internal Medicine

## 2022-07-30 ENCOUNTER — Telehealth: Payer: Self-pay

## 2022-07-30 ENCOUNTER — Encounter: Payer: Self-pay | Admitting: Family Medicine

## 2022-07-30 ENCOUNTER — Ambulatory Visit: Payer: Medicare HMO | Admitting: Student

## 2022-07-30 MED ORDER — FUROSEMIDE 40 MG PO TABS: 40.0000 mg | ORAL_TABLET | Freq: Every day | ORAL | 0 refills | Status: DC

## 2022-07-30 NOTE — Telephone Encounter (Signed)
Patients wife calls nurse line reporting a swollen wrist.   She reports symptoms started over the weekend. She reports swelling, redness and pain. She denies any fevers, chills or warmth to the area. No drainage.   She reports she has been icing the area and giving tylenol and ibuprofen for pain. However, she reports no relief.   Wife advised he may need to make a FU apt for evaluation. She reports she will send a Clinical cytogeneticist message with a picture for PCP.   Precautions discussed with wife.   Will forward to PCP.

## 2022-07-30 NOTE — Telephone Encounter (Signed)
Rx(s) sent to pharmacy electronically.  Patients wife notified directly and voiced understanding.

## 2022-07-30 NOTE — Telephone Encounter (Signed)
*  STAT* If patient is at the pharmacy, call can be transferred to refill team.   1. Which medications need to be refilled? (please list name of each medication and dose if known) furosemide (LASIX) 40 MG tablet    2. Which pharmacy/location (including street and city if local pharmacy) is medication to be sent to? [facility] - [place], [place] - [place] [id number]   3. Do they need a 30 day or 90 day supply?  1 week if possible. Patient's wife states patient will be out of medication after today. Patient will need enough medication to last him until 7/02 appointment.

## 2022-07-31 ENCOUNTER — Encounter (HOSPITAL_BASED_OUTPATIENT_CLINIC_OR_DEPARTMENT_OTHER): Payer: Medicare HMO | Admitting: Physician Assistant

## 2022-08-05 ENCOUNTER — Ambulatory Visit: Payer: Medicare HMO | Admitting: Internal Medicine

## 2022-08-06 ENCOUNTER — Ambulatory Visit: Payer: Medicare HMO | Admitting: Student

## 2022-08-15 NOTE — Progress Notes (Deleted)
Cardiology Clinic Note   Patient Name: Juan [name] Date of Encounter: 08/15/2022  Primary Care Provider:  Carney Living, MD Primary Cardiologist:  Chrystie Nose, MD  Patient Profile    Juan [name] 72 year old male presents to the clinic today for follow-up evaluation of his chronic systolic CHF, atrial fibrillation, and hypertension.  Past Medical History    Past Medical History:  Diagnosis Date   Alcoholism (HCC)    Allergy    seasonal    Anxiety    Asthma    CHF (congestive heart failure) (HCC) 10/2010   ECHO:  EF 40%, Grade II diastolic dysfunction   Chronic diastolic heart failure (HCC)    COPD (chronic obstructive pulmonary disease) (HCC)    COPD exacerbation (HCC)    Depression    Headache    Hyperlipidemia    Hypertension    Lung nodule seen on imaging study 06/15/2013   Needs follow-up in 3 months (early 2017)    RBBB 11/09/2012   Seizures (HCC)    Tremor 07/30/2011   Likely secondary to long-term alcohol abuse. Did discuss how his tremors affecting his alcoholism. We also switching her from albuterol to Atrovent in case this is causing any worsening of his tremor.    Umbilical hernia 08/06/2012   Past Surgical History:  Procedure Laterality Date   TOENAIL EXCISION Right 1967   Removal of ingrown toenail [Other]    Allergies  Allergies  Allergen Reactions   Coreg [Carvedilol] Shortness Of Breath    Shortness of breath with rechallenge 10/01/17   Aspartame And Phenylalanine Nausea And Vomiting    History of Present Illness    Juan [name] has a PMH of chronic systolic CHF, atrial fibrillation with RVR, hypertension, bronchiectasis, hepatomegaly, type 2 diabetes, venous stasis ulcer, tobacco use, umbilical hernia, mobility impairment, and severe alcohol use disorder.  He was hospitalized at [facility] after seizure-like activity.  His wife noted rhythmic jerking movements that lasted for about 30 seconds.  He was unresponsive and  postictal afterwards for about 30 minutes.  He was not noted to have bitten his tongue, have bowel or bladder incontinence.  He denied history of head injury.  He reported that 2 years prior he had a similar episode after he abstained from alcohol and stopped his Klonopin suddenly.  His wife felt he had been taking his medications.  His Zoloft had been changed to Effexor.  He also received treatment for aspiration pneumonia.  Echocardiogram at that time showed severe cardiomyopathy with an EF of 30-35%.  He underwent stress testing which did not demonstrate ischemia.  He was felt to have had a nonischemic cardiomyopathy related to his alcohol use.  He underwent diuresis and was placed on heart failure medication.  He was last seen by Dr. [name] virtually on 02/17/2020.  During that time he reported deaths in his family and serious illness.  His mood seems somewhat down.  He felt his heart failure symptoms are stable.  His weight was 204 pounds and stable.  He denied worsening swelling.  He did note a chronic persistent cough related to COPD.  He denied recent increased mucus production.  He denied issues with cramping.  He reported compliance with his magnesium supplementation.  His potassium was noted to be normal.  He presents to the clinic today for follow-up evaluation and states***.  *** denies chest pain, shortness of breath, lower extremity edema, fatigue, palpitations, melena, hematuria, hemoptysis, diaphoresis, weakness, presyncope, syncope,  orthopnea, and PND.  Chronic systolic CHF, alcoholic cardiomyopathy-weight stable.  Euvolemic.  NYHA class I-2.  Echocardiogram 10/18/2019 showed an LVEF of 30-35% with global hypokinesis and mild concentric LVH.  His diastolic parameters were intermediate.  Previously underwent nuclear stress testing which did not show ischemic changes.  Reduced EF was felt to be related to heavy drinking x 15 years. Continue current medical therapy Heart healthy low-sodium  diet Increase physical activity as tolerated Continue daily weights Lower extremity support stockings Elevate lower extremities when not active Repeat echocardiogram  Essential hypertension-BP today***. Maintain blood pressure log Continue losartan, metoprolol Hyperlipidemia-LDL***  Chronic hyponatremia-sodium***. Follows with PCP  Disposition: Follow-up with Dr. [name] or me in 6-9 months.  Home Medications    Prior to Admission medications   Medication Sig Start Date End Date Taking? Authorizing Provider  aspirin 81 MG chewable tablet Chew 1 tablet (81 mg total) by mouth daily. 12/20/15   Mayo, Allyn Kenner, MD  cholecalciferol (VITAMIN D) 1000 units tablet TAKE 1 TABLET BY MOUTH EVERY DAY 11/10/17   Tobey Grim, MD  clonazePAM (KLONOPIN) 0.5 MG tablet TAKE ONE TABLET BY MOUTH TWICE DAILY 07/15/22   Chambliss, Estill Batten, MD  EPINEPHrine Base (PRIMATENE MIST IN) Inhale 1 puff into the lungs as needed (for breathing).    [provider]  fluticasone (FLOVENT HFA) 220 MCG/ACT inhaler Inhale 1 puff into the lungs 2 (two) times daily. 10/31/21   Carney Living, MD  furosemide (LASIX) 40 MG tablet Take 1 tablet (40 mg total) by mouth daily. 07/30/22   Hilty, Lisette Abu, MD  gabapentin (NEURONTIN) 300 MG capsule TAKE 1 CAPSULE BY MOUTH 3 TIMES DAILY AS NEEDED. 08/10/21   Carney Living, MD  hydrocortisone 2.5 % ointment Apply topically 2 (two) times daily. 07/09/22   Chambliss, Estill Batten, MD  Ipratropium-Albuterol (COMBIVENT RESPIMAT) 20-100 MCG/ACT AERS respimat INHALE 1-2 PUFFS EVERY 4 HOURS AS NEEDED 05/14/22   Carney Living, MD  losartan (COZAAR) 50 MG tablet TAKE ONE TABLET BY MOUTH ONCE DAILY 02/20/22   Carney Living, MD  magnesium oxide (MAG-OX) 400 (240 Mg) MG tablet Take 1 tablet (400 mg total) by mouth daily. 07/05/22   Hilty, Lisette Abu, MD  metFORMIN (GLUCOPHAGE-XR) 500 MG 24 hr tablet Take 1 tablet (500 mg total) by mouth daily with breakfast.  02/21/22   Celine Mans, MD  metoprolol succinate (TOPROL-XL) 25 MG 24 hr tablet Take 1.5 tablets (37.5 mg total) by mouth daily. Please keep upcoming appt for refills. Thank you Patient taking differently: Take 12.5 mg by mouth daily. Please keep upcoming appt for refills. Thank you 04/29/22   Chrystie Nose, MD  Multiple Vitamin (MULTIVITAMIN WITH MINERALS) TABS tablet Take 1 tablet by mouth daily as needed (for vitamin).     [provider]  phenylephrine (NEO-SYNEPHRINE) 1 % nasal spray Place 1 drop into both nostrils every 6 (six) hours as needed for congestion (4 way nasal spray).    [provider]  sodium chloride (OCEAN) 0.65 % SOLN nasal spray Place 1 spray into both nostrils as needed for congestion.    [provider]  venlafaxine XR (EFFEXOR-XR) 150 MG 24 hr capsule Take 1 capsule (150 mg total) by mouth daily with breakfast. 02/15/22   Carney Living, MD  albuterol (PROVENTIL,VENTOLIN) 90 MCG/ACT inhaler Inhale 2 puffs into the lungs every 6 (six) hours as needed for wheezing. 01/03/11 04/22/11  Tobey Grim, MD    Family History  Family History  Problem Relation Age of Onset   Alzheimer's disease Mother    Diabetes Mother    Heart disease Maternal Uncle    He indicated that his mother is deceased. He indicated that his father is deceased. He indicated that the status of his maternal uncle is unknown.  Social History    Social History   Socioeconomic History   Marital status: Married    Spouse name: Not on file   Number of children: Not on file   Years of education: Not on file   Highest education level: Not on file  Occupational History   Occupation: Unemployed  Tobacco Use   Smoking status: Every Day    Current packs/day: 1.50    Average packs/day: 1.5 packs/day for 40.0 years (60.0 ttl pk-yrs)    Types: Cigarettes   Smokeless tobacco: Never   Tobacco comments:    a little more than 1 ppd (02/17/14)  Vaping Use    Vaping status: Never Used  Substance and Sexual Activity   Alcohol use: Not on file    Comment: 1/2 galloon liquior in 2 days     12/28/2014 wife reports 32oz / day   Drug use: No   Sexual activity: Not Currently  Other Topics Concern   Not on file  Social History Narrative   Lives with wife [name] in [place].  She is very involved in his health care   Social Determinants of Health   Financial Resource Strain: Not on file  Food Insecurity: Not on file  Transportation Needs: Not on file  Physical Activity: Not on file  Stress: Stress Concern Present (07/04/2020)   [employer] of Occupational Health - Occupational Stress Questionnaire    Feeling of Stress : Very much  Social Connections: Not on file  Intimate Partner Violence: Not on file     Review of Systems    General:  No chills, fever, night sweats or weight changes.  Cardiovascular:  No chest pain, dyspnea on exertion, edema, orthopnea, palpitations, paroxysmal nocturnal dyspnea. Dermatological: No rash, lesions/masses Respiratory: No cough, dyspnea Urologic: No hematuria, dysuria Abdominal:   No nausea, vomiting, diarrhea, bright red blood per rectum, melena, or hematemesis Neurologic:  No visual changes, wkns, changes in mental status. All other systems reviewed and are otherwise negative except as noted above.  Physical Exam    VS:  There were no vitals taken for this visit. , BMI There is no height or weight on file to calculate BMI. GEN: Well nourished, well developed, in no acute distress. HEENT: normal. Neck: Supple, no JVD, carotid bruits, or masses. Cardiac: RRR, no murmurs, rubs, or gallops. No clubbing, cyanosis, edema.  Radials/DP/PT 2+ and equal bilaterally.  Respiratory:  Respirations regular and unlabored, clear to auscultation bilaterally. GI: Soft, nontender, nondistended, BS + x 4. MS: no deformity or atrophy. Skin: warm and dry, no rash. Neuro:  Strength and sensation are intact. Psych:  Normal affect.  Accessory Clinical Findings    Recent Labs: 07/09/2022: ALT 26; BUN 18; Creatinine, Ser 0.99; Hemoglobin 15.2; Platelets 304; Potassium 4.3; Sodium 137   Recent Lipid Panel    Component Value Date/Time   CHOL 158 10/18/2019 0846   TRIG 114 10/18/2019 0846   HDL 40 10/18/2019 0846   CHOLHDL 4.0 10/18/2019 0846   CHOLHDL 4.4 02/13/2016 1446   VLDL 13 02/13/2016 1446   LDLCALC 97 10/18/2019 0846   LDLDIRECT 74 07/20/2012 1124    No BP recorded.  {Refresh Note OR Click  here to enter BP  :1}***    ECG personally reviewed by me today- ***    Echocardiogram 10/18/19 IMPRESSIONS     1. Left ventricular ejection fraction, by estimation, is 30 to 35%. The  left ventricle has moderately decreased function. The left ventricle  demonstrates global hypokinesis. There is mild concentric left ventricular  hypertrophy. Left ventricular  diastolic parameters are indeterminate.   2. Right ventricular systolic function was not well visualized. The right  ventricular size is not well visualized.   3. Left atrial size was mildly dilated.   4. A small pericardial effusion is present. The pericardial effusion is  circumferential.   5. The mitral valve is normal in structure. Trivial mitral valve  regurgitation. No evidence of mitral stenosis.   6. The aortic valve is tricuspid. Aortic valve regurgitation is not  visualized. No aortic stenosis is present.   7. The inferior vena cava is normal in size with greater than 50%  respiratory variability, suggesting right atrial pressure of 3 mmHg.   Comparison(s): No significant change from prior study.   FINDINGS   Left Ventricle: Left ventricular ejection fraction, by estimation, is 30  to 35%. The left ventricle has moderately decreased function. The left  ventricle demonstrates global hypokinesis. The left ventricular internal  cavity size was normal in size.  There is mild concentric left ventricular hypertrophy. Left  ventricular  diastolic parameters are indeterminate.   Right Ventricle: The right ventricular size is not well visualized. Right  vetricular wall thickness was not well visualized. Right ventricular  systolic function was not well visualized.   Left Atrium: Left atrial size was mildly dilated.   Right Atrium: Right atrial size was normal in size.   Pericardium: A small pericardial effusion is present. The pericardial  effusion is circumferential.   Mitral Valve: The mitral valve is normal in structure. Trivial mitral  valve regurgitation. No evidence of mitral valve stenosis.   Tricuspid Valve: The tricuspid valve is normal in structure. Tricuspid  valve regurgitation is trivial. No evidence of tricuspid stenosis.   Aortic Valve: The aortic valve is tricuspid. Aortic valve regurgitation is  not visualized. No aortic stenosis is present.   Pulmonic Valve: The pulmonic valve was not well visualized. Pulmonic valve  regurgitation is not visualized. No evidence of pulmonic stenosis.   Aorta: The aortic root and ascending aorta are structurally normal, with  no evidence of dilitation.   Venous: The inferior vena cava is normal in size with greater than 50%  respiratory variability, suggesting right atrial pressure of 3 mmHg.   IAS/Shunts: The atrial septum is grossly normal.       Assessment & Plan   1.  ***   Thomasene Ripple.  NP-C     08/15/2022, 8:32 AM [facility] [address] Office [phone] Fax [phone]    I spent***minutes examining this patient, reviewing medications, and using patient centered shared decision making involving her cardiac care.  Prior to her visit I spent greater than 20 minutes reviewing her past medical history,  medications, and prior cardiac tests.

## 2022-08-20 ENCOUNTER — Other Ambulatory Visit: Payer: Self-pay | Admitting: Family Medicine

## 2022-08-20 DIAGNOSIS — J479 Bronchiectasis, uncomplicated: Secondary | ICD-10-CM

## 2022-08-21 ENCOUNTER — Other Ambulatory Visit: Payer: Self-pay | Admitting: Internal Medicine

## 2022-08-22 ENCOUNTER — Ambulatory Visit: Payer: Medicare HMO | Admitting: General Practice

## 2022-08-28 ENCOUNTER — Ambulatory Visit (HOSPITAL_BASED_OUTPATIENT_CLINIC_OR_DEPARTMENT_OTHER): Payer: Medicare HMO | Admitting: Internal Medicine

## 2022-09-02 ENCOUNTER — Other Ambulatory Visit: Payer: Self-pay | Admitting: Family Medicine

## 2022-09-06 ENCOUNTER — Ambulatory Visit (INDEPENDENT_AMBULATORY_CARE_PROVIDER_SITE_OTHER): Payer: Medicare HMO

## 2022-09-06 VITALS — Ht 66.0 in | Wt 194.0 lb

## 2022-09-06 DIAGNOSIS — Z0000 Encounter for general adult medical examination without abnormal findings: Secondary | ICD-10-CM | POA: Diagnosis not present

## 2022-09-06 NOTE — Progress Notes (Cosign Needed)
Subjective:   Juan [name] is a 72 y.o. male who presents for an Initial Medicare Annual Wellness Visit.  Visit Complete: Virtual  I connected with  [name] on 09/06/22 by a audio enabled telemedicine application and verified that I am speaking with the correct person using two identifiers.  Patient Location: Home  Provider Location: Home Office  I discussed the limitations of evaluation and management by telemedicine. The patient expressed understanding and agreed to proceed.  Vital Signs: Per patient no change in vitals since last visit.  Review of Systems     Cardiac Risk Factors include: advanced age (>72men, >58 women);diabetes mellitus;dyslipidemia;hypertension;male gender;smoking/ tobacco exposure     Objective:    Today's Vitals   09/06/22 1732  Weight: 194 lb (88 kg)  Height: 5\' 6"  (1.676 m)   Body mass index is 31.31 kg/m.     09/06/2022    5:37 PM 03/05/2022    9:12 AM 02/27/2022    9:22 AM 02/21/2022    1:31 PM 02/18/2022   10:23 AM 02/06/2021   10:13 AM 08/16/2020    9:49 AM  Advanced Directives  Does Patient Have a Medical Advance Directive? No No No No No No No  Would patient like information on creating a medical advance directive? Yes (MAU/Ambulatory/Procedural Areas - Information given) No - Patient declined No - Patient declined No - Patient declined No - Patient declined No - Patient declined No - Patient declined    Current Medications (verified) Outpatient Encounter Medications as of 09/06/2022  Medication Sig   aspirin 81 MG chewable tablet Chew 1 tablet (81 mg total) by mouth daily.   cholecalciferol (VITAMIN D) 1000 units tablet TAKE 1 TABLET BY MOUTH EVERY DAY   clonazePAM (KLONOPIN) 0.5 MG tablet TAKE ONE TABLET BY MOUTH TWICE DAILY   EPINEPHrine Base (PRIMATENE MIST IN) Inhale 1 puff into the lungs as needed (for breathing).   furosemide (LASIX) 40 MG tablet TAKE ONE TABLET BY MOUTH ONCE DAILY   gabapentin (NEURONTIN) 300 MG  capsule TAKE 1 CAPSULE BY MOUTH 3 TIMES DAILY AS NEEDED.   hydrocortisone 2.5 % ointment Apply topically 2 (two) times daily.   Ipratropium-Albuterol (COMBIVENT RESPIMAT) 20-100 MCG/ACT AERS respimat INHALE 1-2 PUFFS into THE lungs EVERY 4 HOURS AS NEEDED   losartan (COZAAR) 50 MG tablet TAKE ONE TABLET BY MOUTH ONCE DAILY   magnesium oxide (MAG-OX) 400 (240 Mg) MG tablet Take 1 tablet (400 mg total) by mouth daily.   metFORMIN (GLUCOPHAGE-XR) 500 MG 24 hr tablet Take 1 tablet (500 mg total) by mouth daily with breakfast.   metoprolol succinate (TOPROL-XL) 25 MG 24 hr tablet Take 1.5 tablets (37.5 mg total) by mouth daily. Please keep upcoming appt for refills. Thank you (Patient taking differently: Take 12.5 mg by mouth daily. Please keep upcoming appt for refills. Thank you)   Multiple Vitamin (MULTIVITAMIN WITH MINERALS) TABS tablet Take 1 tablet by mouth daily as needed (for vitamin).    nicotine polacrilex (COMMIT) 2 MG lozenge Take 2 mg by mouth as needed for smoking cessation.   phenylephrine (NEO-SYNEPHRINE) 1 % nasal spray Place 1 drop into both nostrils every 6 (six) hours as needed for congestion (4 way nasal spray).   sodium chloride (OCEAN) 0.65 % SOLN nasal spray Place 1 spray into both nostrils as needed for congestion.   venlafaxine XR (EFFEXOR-XR) 150 MG 24 hr capsule TAKE 1 CAPSULE BY MOUTH DAILY WITH BREAKFAST.   [DISCONTINUED] albuterol (PROVENTIL,VENTOLIN) 90 MCG/ACT inhaler  Inhale 2 puffs into the lungs every 6 (six) hours as needed for wheezing.   [DISCONTINUED] fluticasone (FLOVENT HFA) 220 MCG/ACT inhaler Inhale 1 puff into the lungs 2 (two) times daily.   No facility-administered encounter medications on file as of 09/06/2022.    Allergies (verified) Coreg [carvedilol] and Aspartame and phenylalanine   History: Past Medical History:  Diagnosis Date   Alcoholism (HCC)    Allergy    seasonal    Anxiety    Asthma    CHF (congestive heart failure) (HCC) 10/2010    ECHO:  EF 40%, Grade II diastolic dysfunction   Chronic diastolic heart failure (HCC)    COPD (chronic obstructive pulmonary disease) (HCC)    COPD exacerbation (HCC)    Depression    Headache    Hyperlipidemia    Hypertension    Lung nodule seen on imaging study 06/15/2013   Needs follow-up in 3 months (early 2017)    RBBB 11/09/2012   Seizures (HCC)    Tremor 07/30/2011   Likely secondary to long-term alcohol abuse. Did discuss how his tremors affecting his alcoholism. We also switching her from albuterol to Atrovent in case this is causing any worsening of his tremor.    Umbilical hernia 08/06/2012   Past Surgical History:  Procedure Laterality Date   TOENAIL EXCISION Right 1967   Removal of ingrown toenail [Other]   Family History  Problem Relation Age of Onset   Alzheimer's disease Mother    Diabetes Mother    Heart disease Maternal Uncle    Social History   Socioeconomic History   Marital status: Married    Spouse name: Not on file   Number of children: Not on file   Years of education: Not on file   Highest education level: Not on file  Occupational History   Occupation: Unemployed  Tobacco Use   Smoking status: Former    Average packs/day: 1.5 packs/day for 40.0 years (60.0 ttl pk-yrs)    Types: Cigarettes    Start date: 2022    Quit date: 1982    Years since quitting: 42.6   Smokeless tobacco: Never   Tobacco comments:    a little more than 1 ppd (02/17/14)  Vaping Use   Vaping status: Never Used  Substance and Sexual Activity   Alcohol use: Not on file    Comment: 1/2 galloon liquior in 2 days     12/28/2014 wife reports 32oz / day   Drug use: No   Sexual activity: Not Currently  Other Topics Concern   Not on file  Social History Narrative   Lives with wife [name] in [place].  She is very involved in his health care   Social Determinants of Health   Financial Resource Strain: Low Risk  (09/06/2022)   Overall Financial Resource Strain (CARDIA)     Difficulty of Paying Living Expenses: Not hard at all  Food Insecurity: No Food Insecurity (09/06/2022)   Hunger Vital Sign    Worried About Running Out of Food in the Last Year: Never true    Ran Out of Food in the Last Year: Never true  Transportation Needs: No Transportation Needs (09/06/2022)   PRAPARE - Administrator, Civil Service (Medical): No    Lack of Transportation (Non-Medical): No  Physical Activity: Inactive (09/06/2022)   Exercise Vital Sign    Days of Exercise per Week: 0 days    Minutes of Exercise per Session: 0 min  Stress: No Stress  Concern Present (09/06/2022)   [employer] of Occupational Health - Occupational Stress Questionnaire    Feeling of Stress : Only a little  Social Connections: Moderately Isolated (09/06/2022)   Social Connection and Isolation Panel [NHANES]    Frequency of Communication with Friends and Family: More than three times a week    Frequency of Social Gatherings with Friends and Family: Three times a week    Attends Religious Services: Never    Active Member of Clubs or Organizations: No    Attends Banker Meetings: Never    Marital Status: Married    Tobacco Counseling Counseling given: Not Answered Tobacco comments: a little more than 1 ppd (02/17/14)   Clinical Intake:  Pre-visit preparation completed: Yes  Pain : No/denies pain     Diabetes: Yes CBG done?: No Did pt. bring in CBG monitor from home?: No  How often do you need to have someone help you when you read instructions, pamphlets, or other written materials from your doctor or pharmacy?: 1 - Never  Interpreter Needed?: No  Comments: Assisted with visit by wife Information entered by :: Kandis Fantasia LPN   Activities of Daily Living    09/06/2022    5:33 PM  In your present state of health, do you have any difficulty performing the following activities:  Hearing? 0  Vision? 0  Difficulty concentrating or making decisions? 0  Walking or  climbing stairs? 0  Dressing or bathing? 0  Doing errands, shopping? 0  Preparing Food and eating ? N  Using the Toilet? N  In the past six months, have you accidently leaked urine? N  Do you have problems with loss of bowel control? N  Managing your Medications? N  Managing your Finances? N  Housekeeping or managing your Housekeeping? N    Patient Care Team: Carney Living, MD as PCP - General (Family Medicine) [name] Lisette Abu, MD as PCP - Cardiology (Cardiology) [name], RN as Case Manager  Indicate any recent Medical Services you may have received from other than [facility] providers in the past year (date may be approximate).     Assessment:   This is a routine wellness examination for Naseer.  Hearing/Vision screen Hearing Screening - Comments:: Denies hearing difficulties   Vision Screening - Comments:: No vision problems; will schedule routine eye exam soon     Dietary issues and exercise activities discussed:     Goals Addressed             This Visit's Progress    Increase physical activity        Depression Screen    09/06/2022    5:35 PM 07/09/2022    9:50 AM 03/05/2022    9:12 AM 02/27/2022    9:21 AM 02/21/2022    1:32 PM 02/18/2022   10:22 AM 02/06/2021   10:12 AM  PHQ 2/9 Scores  PHQ - 2 Score       1  PHQ- 9 Score       7  Exception Documentation Patient refusal Patient refusal Patient refusal Patient refusal Patient refusal Patient refusal     Fall Risk    09/06/2022    5:39 PM 07/09/2022    9:50 AM 03/05/2022    9:11 AM 02/27/2022    9:21 AM 02/21/2022    1:32 PM  Fall Risk   Falls in the past year? 0 0 0 1 0  Comment     non ambulatory  Number falls  in past yr: 0 0 0 0 0  Injury with Fall? 0 0 0 0 0  Risk for fall due to : No Fall Risks      Follow up Falls prevention discussed;Education provided;Falls evaluation completed   Falls evaluation completed     MEDICARE RISK AT HOME:  Medicare Risk at Home - 09/06/22 1739     Any stairs  in or around the home? No    If so, are there any without handrails? No    Home free of loose throw rugs in walkways, pet beds, electrical cords, etc? Yes    Adequate lighting in your home to reduce risk of falls? Yes    Life alert? No    Use of a cane, walker or w/c? No    Grab bars in the bathroom? Yes    Shower chair or bench in shower? No    Elevated toilet seat or a handicapped toilet? No             TIMED UP AND GO:  Was the test performed? No    Cognitive Function:        09/06/2022    5:39 PM  6CIT Screen  What Year? 0 points  What month? 0 points  What time? 0 points  Count back from 20 0 points  Months in reverse 2 points  Repeat phrase 0 points  Total Score 2 points    Immunizations Immunization History  Administered Date(s) Administered   Fluad Quad(high Dose 65+) 11/03/2019, 10/30/2021   Influenza,inj,Quad PF,6+ Mos 11/11/2012, 10/26/2013, 11/18/2014, 12/25/2015, 12/13/2016, 10/07/2017, 02/06/2021   PFIZER Comirnaty(Gray Top)Covid-19 Tri-Sucrose Vaccine 07/12/2020   PFIZER(Purple Top)SARS-COV-2 Vaccination 09/08/2019, 11/03/2019   Pfizer Covid-19 Vaccine Bivalent Booster 82yrs & up 02/06/2021   Pneumococcal Conjugate-13 06/24/2017   Pneumococcal Polysaccharide-23 06/14/2013   Tdap 11/05/2010    TDAP status: Due, Education has been provided regarding the importance of this vaccine. Advised may receive this vaccine at local pharmacy or Health Dept. Aware to provide a copy of the vaccination record if obtained from local pharmacy or Health Dept. Verbalized acceptance and understanding.  Flu Vaccine status: Due, Education has been provided regarding the importance of this vaccine. Advised may receive this vaccine at local pharmacy or Health Dept. Aware to provide a copy of the vaccination record if obtained from local pharmacy or Health Dept. Verbalized acceptance and understanding.  Pneumococcal vaccine status: Due, Education has been provided regarding  the importance of this vaccine. Advised may receive this vaccine at local pharmacy or Health Dept. Aware to provide a copy of the vaccination record if obtained from local pharmacy or Health Dept. Verbalized acceptance and understanding.  Covid-19 vaccine status: Information provided on how to obtain vaccines.   Qualifies for Shingles Vaccine? Yes   Zostavax completed No   Shingrix Completed?: No.    Education has been provided regarding the importance of this vaccine. Patient has been advised to call insurance company to determine out of pocket expense if they have not yet received this vaccine. Advised may also receive vaccine at local pharmacy or Health Dept. Verbalized acceptance and understanding.  Screening Tests Health Maintenance  Topic Date Due   FOOT EXAM  Never done   OPHTHALMOLOGY EXAM  Never done   Diabetic kidney evaluation - Urine ACR  Never done   DTaP/Tdap/Td (2 - Td or Tdap) 11/04/2020   INFLUENZA VACCINE  09/05/2022   COVID-19 Vaccine (5 - 2023-24 season) 09/22/2022 (Originally 10/05/2021)   Zoster Vaccines- Shingrix (  1 of 2) 12/07/2022 (Originally 03/06/2002)   COLON CANCER SCREENING ANNUAL FOBT  03/09/2023 (Originally 03/06/1997)   Pneumonia Vaccine 53+ Years old (3 of 3 - PPSV23 or PCV20) 09/06/2023 (Originally 06/25/2022)   Colonoscopy  09/06/2023 (Originally 03/06/1997)   HEMOGLOBIN A1C  01/08/2023   Diabetic kidney evaluation - eGFR measurement  07/09/2023   Medicare Annual Wellness (AWV)  09/06/2023   Hepatitis C Screening  Completed   HPV VACCINES  Aged Out   Lung Cancer Screening  Discontinued    Health Maintenance  Health Maintenance Due  Topic Date Due   FOOT EXAM  Never done   OPHTHALMOLOGY EXAM  Never done   Diabetic kidney evaluation - Urine ACR  Never done   DTaP/Tdap/Td (2 - Td or Tdap) 11/04/2020   INFLUENZA VACCINE  09/05/2022    Colorectal cancer screening:  Patient declines   Lung Cancer Screening: (Low Dose CT Chest recommended if Age 22-80  years, 20 pack-year currently smoking OR have quit w/in 15years.) does qualify.   Lung Cancer Screening Referral: will discuss with pcp  Additional Screening:  Hepatitis C Screening: does qualify; Completed 06/13/13  Vision Screening: Recommended annual ophthalmology exams for early detection of glaucoma and other disorders of the eye. Is the patient up to date with their annual eye exam?  No  Who is the provider or what is the name of the office in which the patient attends annual eye exams? none If pt is not established with a provider, would they like to be referred to a provider to establish care? No .   Dental Screening: Recommended annual dental exams for proper oral hygiene  Diabetic Foot Exam: Diabetic Foot Exam: Overdue, Pt has been advised about the importance in completing this exam. Pt is scheduled for diabetic foot exam on at next office visit .  Community Resource Referral / Chronic Care Management: CRR required this visit?  No   CCM required this visit?  No    Plan:     I have personally reviewed and noted the following in the patient's chart:   Medical and social history Use of alcohol, tobacco or illicit drugs  Current medications and supplements including opioid prescriptions. Patient is not currently taking opioid prescriptions. Functional ability and status Nutritional status Physical activity Advanced directives List of other physicians Hospitalizations, surgeries, and ER visits in previous 12 months Vitals Screenings to include cognitive, depression, and falls Referrals and appointments  In addition, I have reviewed and discussed with patient certain preventive protocols, quality metrics, and best practice recommendations. A written personalized care plan for preventive services as well as general preventive health recommendations were provided to patient.     Kandis Fantasia [place], [place]   02/09/1094   After Visit Summary: (MyChart) Due to this  being a telephonic visit, the after visit summary with patients personalized plan was offered to patient via MyChart   Nurse Notes: No concerns at this time

## 2022-09-06 NOTE — Patient Instructions (Signed)
Juan [name] , Thank you for taking time to come for your Medicare Wellness Visit. I appreciate your ongoing commitment to your health goals. Please review the following plan we discussed and let me know if I can assist you in the future.   Referrals/Orders/Follow-Ups/Clinician Recommendations: Aim for 30 minutes of exercise or brisk walking, 6-8 glasses of water, and 5 servings of fruits and vegetables each day.  This is a list of the screening recommended for you and due dates:  Health Maintenance  Topic Date Due   Complete foot exam   Never done   Eye exam for diabetics  Never done   Yearly kidney health urinalysis for diabetes  Never done   DTaP/Tdap/Td vaccine (2 - Td or Tdap) 11/04/2020   Flu Shot  09/05/2022   COVID-19 Vaccine (5 - 2023-24 season) 09/22/2022*   Zoster (Shingles) Vaccine (1 of 2) 12/07/2022*   Stool Blood Test  03/09/2023*   Pneumonia Vaccine (3 of 3 - PPSV23 or PCV20) 09/06/2023*   Colon Cancer Screening  09/06/2023*   Hemoglobin A1C  01/08/2023   Yearly kidney function blood test for diabetes  07/09/2023   Medicare Annual Wellness Visit  09/06/2023   Hepatitis C Screening  Completed   HPV Vaccine  Aged Out   Screening for Lung Cancer  Discontinued  *Topic was postponed. The date shown is not the original due date.    Advanced directives: (ACP Link)Information on Advanced Care Planning can be found at [facility] Advance Health Care Directives Advance Health Care Directives ([email or web address]   Next Medicare Annual Wellness Visit scheduled for next year: Yes  Preventive Care 65 Years and Older, Male  Preventive care refers to lifestyle choices and visits with your health care provider that can promote health and wellness. What does preventive care include? A yearly physical exam. This is also called an annual well check. Dental exams once or twice a year. Routine eye exams. Ask your health care provider how often you should have your eyes  checked. Personal lifestyle choices, including: Daily care of your teeth and gums. Regular physical activity. Eating a healthy diet. Avoiding tobacco and drug use. Limiting alcohol use. Practicing safe sex. Taking low doses of aspirin every day. Taking vitamin and mineral supplements as recommended by your health care provider. What happens during an annual well check? The services and screenings done by your health care provider during your annual well check will depend on your age, overall health, lifestyle risk factors, and family history of disease. Counseling  Your health care provider may ask you questions about your: Alcohol use. Tobacco use. Drug use. Emotional well-being. Home and relationship well-being. Sexual activity. Eating habits. History of falls. Memory and ability to understand (cognition). Work and work Astronomer. Screening  You may have the following tests or measurements: Height, weight, and BMI. Blood pressure. Lipid and cholesterol levels. These may be checked every 5 years, or more frequently if you are over 53 years old. Skin check. Lung cancer screening. You may have this screening every year starting at age 53 if you have a 30-pack-year history of smoking and currently smoke or have quit within the past 15 years. Fecal occult blood test (FOBT) of the stool. You may have this test every year starting at age 60. Flexible sigmoidoscopy or colonoscopy. You may have a sigmoidoscopy every 5 years or a colonoscopy every 10 years starting at age 13. Prostate cancer screening. Recommendations will vary depending on your family history and  other risks. Hepatitis C blood test. Hepatitis B blood test. Sexually transmitted disease (STD) testing. Diabetes screening. This is done by checking your blood sugar (glucose) after you have not eaten for a while (fasting). You may have this done every 1-3 years. Abdominal aortic aneurysm (AAA) screening. You may need this  if you are a current or former smoker. Osteoporosis. You may be screened starting at age 25 if you are at high risk. Talk with your health care provider about your test results, treatment options, and if necessary, the need for more tests. Vaccines  Your health care provider may recommend certain vaccines, such as: Influenza vaccine. This is recommended every year. Tetanus, diphtheria, and acellular pertussis (Tdap, Td) vaccine. You may need a Td booster every 10 years. Zoster vaccine. You may need this after age 14. Pneumococcal 13-valent conjugate (PCV13) vaccine. One dose is recommended after age 14. Pneumococcal polysaccharide (PPSV23) vaccine. One dose is recommended after age 83. Talk to your health care provider about which screenings and vaccines you need and how often you need them. This information is not intended to replace advice given to you by your health care provider. Make sure you discuss any questions you have with your health care provider. Document Released: 02/17/2015 Document Revised: 10/11/2015 Document Reviewed: 11/22/2014 Elsevier Interactive Patient Education  2017 [employer].  Fall Prevention in the Home Falls can cause injuries. They can happen to people of all ages. There are many things you can do to make your home safe and to help prevent falls. What can I do on the outside of my home? Regularly fix the edges of walkways and driveways and fix any cracks. Remove anything that might make you trip as you walk through a door, such as a raised step or threshold. Trim any bushes or trees on the path to your home. Use bright outdoor lighting. Clear any walking paths of anything that might make someone trip, such as rocks or tools. Regularly check to see if handrails are loose or broken. Make sure that both sides of any steps have handrails. Any raised decks and porches should have guardrails on the edges. Have any leaves, snow, or ice cleared regularly. Use sand  or salt on walking paths during winter. Clean up any spills in your garage right away. This includes oil or grease spills. What can I do in the bathroom? Use night lights. Install grab bars by the toilet and in the tub and shower. Do not use towel bars as grab bars. Use non-skid mats or decals in the tub or shower. If you need to sit down in the shower, use a plastic, non-slip stool. Keep the floor dry. Clean up any water that spills on the floor as soon as it happens. Remove soap buildup in the tub or shower regularly. Attach bath mats securely with double-sided non-slip rug tape. Do not have throw rugs and other things on the floor that can make you trip. What can I do in the bedroom? Use night lights. Make sure that you have a light by your bed that is easy to reach. Do not use any sheets or blankets that are too big for your bed. They should not hang down onto the floor. Have a firm chair that has side arms. You can use this for support while you get dressed. Do not have throw rugs and other things on the floor that can make you trip. What can I do in the kitchen? Clean up any spills right  away. Avoid walking on wet floors. Keep items that you use a lot in easy-to-reach places. If you need to reach something above you, use a strong step stool that has a grab bar. Keep electrical cords out of the way. Do not use floor polish or wax that makes floors slippery. If you must use wax, use non-skid floor wax. Do not have throw rugs and other things on the floor that can make you trip. What can I do with my stairs? Do not leave any items on the stairs. Make sure that there are handrails on both sides of the stairs and use them. Fix handrails that are broken or loose. Make sure that handrails are as long as the stairways. Check any carpeting to make sure that it is firmly attached to the stairs. Fix any carpet that is loose or worn. Avoid having throw rugs at the top or bottom of the stairs.  If you do have throw rugs, attach them to the floor with carpet tape. Make sure that you have a light switch at the top of the stairs and the bottom of the stairs. If you do not have them, ask someone to add them for you. What else can I do to help prevent falls? Wear shoes that: Do not have high heels. Have rubber bottoms. Are comfortable and fit you well. Are closed at the toe. Do not wear sandals. If you use a stepladder: Make sure that it is fully opened. Do not climb a closed stepladder. Make sure that both sides of the stepladder are locked into place. Ask someone to hold it for you, if possible. Clearly mark and make sure that you can see: Any grab bars or handrails. First and last steps. Where the edge of each step is. Use tools that help you move around (mobility aids) if they are needed. These include: Canes. Walkers. Scooters. Crutches. Turn on the lights when you go into a dark area. Replace any light bulbs as soon as they burn out. Set up your furniture so you have a clear path. Avoid moving your furniture around. If any of your floors are uneven, fix them. If there are any pets around you, be aware of where they are. Review your medicines with your doctor. Some medicines can make you feel dizzy. This can increase your chance of falling. Ask your doctor what other things that you can do to help prevent falls. This information is not intended to replace advice given to you by your health care provider. Make sure you discuss any questions you have with your health care provider. Document Released: 11/17/2008 Document Revised: 06/29/2015 Document Reviewed: 02/25/2014 Elsevier Interactive Patient Education  2017 [employer].

## 2022-09-20 ENCOUNTER — Encounter: Payer: Self-pay | Admitting: Family Medicine

## 2022-09-23 ENCOUNTER — Other Ambulatory Visit: Payer: Self-pay | Admitting: Internal Medicine

## 2022-09-29 NOTE — Progress Notes (Unsigned)
Cardiology Clinic Note   Patient Name: Juan [name] Date of Encounter: 10/01/2022  Primary Care Provider:  Carney Living, MD Primary Cardiologist:  Chrystie Nose, MD  Patient Profile    Juan [name] 72 year old male presents to the clinic today for follow-up evaluation of his chronic systolic CHF, atrial fibrillation, and hypertension.   Past Medical History    Past Medical History:  Diagnosis Date   Alcoholism (HCC)    Allergy    seasonal    Anxiety    Asthma    CHF (congestive heart failure) (HCC) 10/2010   ECHO:  EF 40%, Grade II diastolic dysfunction   Chronic diastolic heart failure (HCC)    COPD (chronic obstructive pulmonary disease) (HCC)    COPD exacerbation (HCC)    Depression    Headache    Hyperlipidemia    Hypertension    Lung nodule seen on imaging study 06/15/2013   Needs follow-up in 3 months (early 2017)    RBBB 11/09/2012   Seizures (HCC)    Tremor 07/30/2011   Likely secondary to long-term alcohol abuse. Did discuss how his tremors affecting his alcoholism. We also switching her from albuterol to Atrovent in case this is causing any worsening of his tremor.    Umbilical hernia 08/06/2012   Past Surgical History:  Procedure Laterality Date   TOENAIL EXCISION Right 1967   Removal of ingrown toenail [Other]    Allergies  Allergies  Allergen Reactions   Coreg [Carvedilol] Shortness Of Breath    Shortness of breath with rechallenge 10/01/17   Aspartame And Phenylalanine Nausea And Vomiting    History of Present Illness    Juan [name] has a PMH of chronic systolic CHF, atrial fibrillation with RVR, hypertension, bronchiectasis, hepatomegaly, type 2 diabetes, venous stasis ulcer, tobacco use, umbilical hernia, mobility impairment, and severe alcohol use disorder.   He was hospitalized at [facility] after seizure-like activity.  His wife noted rhythmic jerking movements that lasted for about 30 seconds.  He was unresponsive  and postictal afterwards for about 30 minutes.  He was not noted to have bitten his tongue, have bowel or bladder incontinence.  He denied history of head injury.  He reported that 2 years prior he had a similar episode after he abstained from alcohol and stopped his Klonopin suddenly.  His wife felt he had been taking his medications.  His Zoloft had been changed to Effexor.  He also received treatment for aspiration pneumonia.  Echocardiogram at that time showed severe cardiomyopathy with an EF of 30-35%.  He underwent stress testing which did not demonstrate ischemia.  He was felt to have had a nonischemic cardiomyopathy related to his alcohol use.  He underwent diuresis and was placed on heart failure medication.   He was last seen by Dr. [name] virtually on 02/17/2020.  During that time he reported deaths in his family and serious illness.  His mood seems somewhat down.  He felt his heart failure symptoms are stable.  His weight was 204 pounds and stable.  He denied worsening swelling.  He did note a chronic persistent cough related to COPD.  He denied recent increased mucus production.  He denied issues with cramping.  He reported compliance with his magnesium supplementation.  His potassium was noted to be normal.   He presents to the clinic today for follow-up evaluation and states he noticed lower heart rates and decreased his metoprolol.  He has not been noticing palpitations.  We reviewed his previous echocardiogram.  He and his wife expressed understanding.  His EKG today shows sinus tachycardia with first-degree AV block right bundle branch block 103 bpm.  He has been fairly sedentary.  I encouraged him to increase his physical activity.  We will order a CBC, BMP, and magnesium.  I will order an echocardiogram and 14-day cardiac event monitor.  We will plan follow-up in 6 months.   Today he denies chest pain, shortness of breath, lower extremity edema, fatigue,  melena, hematuria, hemoptysis,  diaphoresis, weakness, presyncope, syncope, orthopnea, and PND.    Home Medications    Prior to Admission medications   Medication Sig Start Date End Date Taking? Authorizing Provider  aspirin 81 MG chewable tablet Chew 1 tablet (81 mg total) by mouth daily. 12/20/15   Mayo, Allyn Kenner, MD  cholecalciferol (VITAMIN D) 1000 units tablet TAKE 1 TABLET BY MOUTH EVERY DAY 11/10/17   Tobey Grim, MD  clonazePAM (KLONOPIN) 0.5 MG tablet TAKE ONE TABLET BY MOUTH TWICE DAILY 07/15/22   Chambliss, Estill Batten, MD  EPINEPHrine Base (PRIMATENE MIST IN) Inhale 1 puff into the lungs as needed (for breathing).    [provider]  furosemide (LASIX) 40 MG tablet TAKE ONE TABLET BY MOUTH ONCE DAILY *Please keep upcoming appt for further refills 09/23/22   Ronney Asters, NP  gabapentin (NEURONTIN) 300 MG capsule TAKE 1 CAPSULE BY MOUTH 3 TIMES DAILY AS NEEDED. 08/10/21   Carney Living, MD  hydrocortisone 2.5 % ointment Apply topically 2 (two) times daily. 07/09/22   Chambliss, Estill Batten, MD  Ipratropium-Albuterol (COMBIVENT RESPIMAT) 20-100 MCG/ACT AERS respimat INHALE 1-2 PUFFS into THE lungs EVERY 4 HOURS AS NEEDED 08/20/22   Carney Living, MD  losartan (COZAAR) 50 MG tablet TAKE ONE TABLET BY MOUTH ONCE DAILY 02/20/22   Carney Living, MD  magnesium oxide (MAG-OX) 400 (240 Mg) MG tablet Take 1 tablet (400 mg total) by mouth daily. 07/05/22   Hilty, Lisette Abu, MD  metFORMIN (GLUCOPHAGE-XR) 500 MG 24 hr tablet Take 1 tablet (500 mg total) by mouth daily with breakfast. 02/21/22   Celine Mans, MD  metoprolol succinate (TOPROL-XL) 25 MG 24 hr tablet Take 1.5 tablets (37.5 mg total) by mouth daily. Please keep upcoming appt for refills. Thank you Patient taking differently: Take 12.5 mg by mouth daily. Please keep upcoming appt for refills. Thank you 04/29/22   Chrystie Nose, MD  Multiple Vitamin (MULTIVITAMIN WITH MINERALS) TABS tablet Take 1 tablet by mouth daily as needed (for  vitamin).     [provider]  nicotine polacrilex (COMMIT) 2 MG lozenge Take 2 mg by mouth as needed for smoking cessation.    [provider]  phenylephrine (NEO-SYNEPHRINE) 1 % nasal spray Place 1 drop into both nostrils every 6 (six) hours as needed for congestion (4 way nasal spray).    [provider]  sodium chloride (OCEAN) 0.65 % SOLN nasal spray Place 1 spray into both nostrils as needed for congestion.    [provider]  venlafaxine XR (EFFEXOR-XR) 150 MG 24 hr capsule TAKE 1 CAPSULE BY MOUTH DAILY WITH BREAKFAST. 09/02/22   Carney Living, MD  albuterol (PROVENTIL,VENTOLIN) 90 MCG/ACT inhaler Inhale 2 puffs into the lungs every 6 (six) hours as needed for wheezing. 01/03/11 04/22/11  Tobey Grim, MD    Family History    Family History  Problem Relation Age of Onset   Alzheimer's disease Mother  Diabetes Mother    Heart disease Maternal Uncle    He indicated that his mother is deceased. He indicated that his father is deceased. He indicated that the status of his maternal uncle is unknown.  Social History    Social History   Socioeconomic History   Marital status: Married    Spouse name: Not on file   Number of children: Not on file   Years of education: Not on file   Highest education level: Not on file  Occupational History   Occupation: Unemployed  Tobacco Use   Smoking status: Former    Average packs/day: 1.5 packs/day for 40.0 years (60.0 ttl pk-yrs)    Types: Cigarettes    Start date: 2022    Quit date: 1982    Years since quitting: 42.6   Smokeless tobacco: Never   Tobacco comments:    a little more than 1 ppd (02/17/14)  Vaping Use   Vaping status: Never Used  Substance and Sexual Activity   Alcohol use: Not on file    Comment: 1/2 galloon liquior in 2 days     12/28/2014 wife reports 32oz / day   Drug use: No   Sexual activity: Not Currently  Other Topics Concern   Not on file  Social History  Narrative   Lives with wife [name] in [place].  She is very involved in his health care   Social Determinants of Health   Financial Resource Strain: Low Risk  (09/06/2022)   Overall Financial Resource Strain (CARDIA)    Difficulty of Paying Living Expenses: Not hard at all  Food Insecurity: No Food Insecurity (09/06/2022)   Hunger Vital Sign    Worried About Running Out of Food in the Last Year: Never true    Ran Out of Food in the Last Year: Never true  Transportation Needs: No Transportation Needs (09/06/2022)   PRAPARE - Administrator, Civil Service (Medical): No    Lack of Transportation (Non-Medical): No  Physical Activity: Inactive (09/06/2022)   Exercise Vital Sign    Days of Exercise per Week: 0 days    Minutes of Exercise per Session: 0 min  Stress: No Stress Concern Present (09/06/2022)   [employer] of Occupational Health - Occupational Stress Questionnaire    Feeling of Stress : Only a little  Social Connections: Moderately Isolated (09/06/2022)   Social Connection and Isolation Panel [NHANES]    Frequency of Communication with Friends and Family: More than three times a week    Frequency of Social Gatherings with Friends and Family: Three times a week    Attends Religious Services: Never    Active Member of Clubs or Organizations: No    Attends Banker Meetings: Never    Marital Status: Married  Catering manager Violence: Not At Risk (09/06/2022)   Humiliation, Afraid, Rape, and Kick questionnaire    Fear of Current or Ex-Partner: No    Emotionally Abused: No    Physically Abused: No    Sexually Abused: No     Review of Systems    General:  No chills, fever, night sweats or weight changes.  Cardiovascular:  No chest pain, dyspnea on exertion, edema, orthopnea, palpitations, paroxysmal nocturnal dyspnea. Dermatological: No rash, lesions/masses Respiratory: No cough, dyspnea Urologic: No hematuria, dysuria Abdominal:   No nausea, vomiting,  diarrhea, bright red blood per rectum, melena, or hematemesis Neurologic:  No visual changes, wkns, changes in mental status. All other systems reviewed and are otherwise  negative except as noted above.  Physical Exam    VS:  BP 114/63 (BP Location: Left Arm, Patient Position: Sitting, Cuff Size: Normal)   Pulse 93   Ht 5\' 8"  (1.727 m)   Wt 188 lb 12.8 oz (85.6 kg)   BMI 28.71 kg/m  , BMI Body mass index is 28.71 kg/m. GEN: Well nourished, well developed, in no acute distress. HEENT: normal. Neck: Supple, no JVD, carotid bruits, or masses. Cardiac: RRR, no murmurs, rubs, or gallops. No clubbing, cyanosis, edema.  Radials/DP/PT 2+ and equal bilaterally.  Respiratory:  Respirations regular and unlabored, clear to auscultation bilaterally. GI: Soft, nontender, nondistended, BS + x 4. MS: no deformity or atrophy. Skin: warm and dry, no rash. Neuro:  Strength and sensation are intact. Psych: Normal affect.  Accessory Clinical Findings    Recent Labs: 07/09/2022: ALT 26; BUN 18; Creatinine, Ser 0.99; Hemoglobin 15.2; Platelets 304; Potassium 4.3; Sodium 137   Recent Lipid Panel    Component Value Date/Time   CHOL 158 10/18/2019 0846   TRIG 114 10/18/2019 0846   HDL 40 10/18/2019 0846   CHOLHDL 4.0 10/18/2019 0846   CHOLHDL 4.4 02/13/2016 1446   VLDL 13 02/13/2016 1446   LDLCALC 97 10/18/2019 0846   LDLDIRECT 74 07/20/2012 1124         ECG personally reviewed by me today- EKG Interpretation Date/Time:  Tuesday October 01 2022 11:36:16 EDT Ventricular Rate:  103 PR Interval:  218 QRS Duration:  148 QT Interval:  366 QTC Calculation: 479 R Axis:   264  Text Interpretation: Sinus tachycardia with 1st degree A-V block Right bundle branch block Inferior infarct , age undetermined Confirmed by Edd Fabian [phone]) on 10/01/2022 12:09:30 PM     Echocardiogram 10/18/19 IMPRESSIONS     1. Left ventricular ejection fraction, by estimation, is 30 to 35%. The  left ventricle  has moderately decreased function. The left ventricle  demonstrates global hypokinesis. There is mild concentric left ventricular  hypertrophy. Left ventricular  diastolic parameters are indeterminate.   2. Right ventricular systolic function was not well visualized. The right  ventricular size is not well visualized.   3. Left atrial size was mildly dilated.   4. A small pericardial effusion is present. The pericardial effusion is  circumferential.   5. The mitral valve is normal in structure. Trivial mitral valve  regurgitation. No evidence of mitral stenosis.   6. The aortic valve is tricuspid. Aortic valve regurgitation is not  visualized. No aortic stenosis is present.   7. The inferior vena cava is normal in size with greater than 50%  respiratory variability, suggesting right atrial pressure of 3 mmHg.   Comparison(s): No significant change from prior study.   FINDINGS   Left Ventricle: Left ventricular ejection fraction, by estimation, is 30  to 35%. The left ventricle has moderately decreased function. The left  ventricle demonstrates global hypokinesis. The left ventricular internal  cavity size was normal in size.  There is mild concentric left ventricular hypertrophy. Left ventricular  diastolic parameters are indeterminate.   Right Ventricle: The right ventricular size is not well visualized. Right  vetricular wall thickness was not well visualized. Right ventricular  systolic function was not well visualized.   Left Atrium: Left atrial size was mildly dilated.   Right Atrium: Right atrial size was normal in size.   Pericardium: A small pericardial effusion is present. The pericardial  effusion is circumferential.   Mitral Valve: The mitral valve is normal in  structure. Trivial mitral  valve regurgitation. No evidence of mitral valve stenosis.   Tricuspid Valve: The tricuspid valve is normal in structure. Tricuspid  valve regurgitation is trivial. No evidence of  tricuspid stenosis.   Aortic Valve: The aortic valve is tricuspid. Aortic valve regurgitation is  not visualized. No aortic stenosis is present.   Pulmonic Valve: The pulmonic valve was not well visualized. Pulmonic valve  regurgitation is not visualized. No evidence of pulmonic stenosis.   Aorta: The aortic root and ascending aorta are structurally normal, with  no evidence of dilitation.   Venous: The inferior vena cava is normal in size with greater than 50%  respiratory variability, suggesting right atrial pressure of 3 mmHg.   IAS/Shunts: The atrial septum is grossly normal.         Assessment & Plan   1.  Chronic systolic CHF, alcoholic cardiomyopathy-weight today 188.8 pounds.  Euvolemic.  NYHA class I-2.  Echocardiogram 10/18/2019 showed an LVEF of 30-35% with global hypokinesis and mild concentric LVH.  His diastolic parameters were intermediate.  Previously underwent nuclear stress testing which did not show ischemic changes.  Reduced EF was felt to be related to heavy drinking x 15 years. Continue current medical therapy Heart healthy low-sodium diet Increase physical activity as tolerated Continue daily weights Lower extremity support stockings Elevate lower extremities when not active Repeat echocardiogram   Palpitations-reports over the last several days he has had palpitations and elevated heart rates.  He decreased his metoprolol to 25 mg daily when he noticed lower blood pressures. CBC, BMP, magnesium 14-day cardiac event monitor  Essential hypertension-BP today 114/63. Maintain blood pressure log Continue losartan, metoprolol  Hyperlipidemia-LDL 97 on 10/18/2019 Follows with PCP High-fiber diet  Chronic hyponatremia-sodium 137 on 07/09/2022. Follows with PCP   Disposition: Follow-up with Dr. [name] or me in 6-9 months.   Thomasene Ripple. Violette Morneault NP-C     10/01/2022, 12:09 PM [facility] [address] Office [phone]  Fax [phone]    I spent 15 minutes examining this patient, reviewing medications, and using patient centered shared decision making involving her cardiac care.  Prior to her visit I spent greater than 20 minutes reviewing her past medical history,  medications, and prior cardiac tests.

## 2022-10-01 ENCOUNTER — Ambulatory Visit: Payer: Medicare HMO | Attending: General Practice | Admitting: General Practice

## 2022-10-01 ENCOUNTER — Ambulatory Visit: Payer: Medicare HMO

## 2022-10-01 ENCOUNTER — Encounter: Payer: Self-pay | Admitting: General Practice

## 2022-10-01 VITALS — BP 114/63 | HR 93 | Ht 68.0 in | Wt 188.8 lb

## 2022-10-01 DIAGNOSIS — R002 Palpitations: Secondary | ICD-10-CM

## 2022-10-01 DIAGNOSIS — E871 Hypo-osmolality and hyponatremia: Secondary | ICD-10-CM

## 2022-10-01 DIAGNOSIS — I5022 Chronic systolic (congestive) heart failure: Secondary | ICD-10-CM | POA: Diagnosis not present

## 2022-10-01 DIAGNOSIS — I10 Essential (primary) hypertension: Secondary | ICD-10-CM

## 2022-10-01 MED ORDER — METOPROLOL SUCCINATE ER 25 MG PO TB24
37.5000 mg | ORAL_TABLET | Freq: Every day | ORAL | 7 refills | Status: DC
Start: 2022-10-01 — End: 2022-10-04

## 2022-10-01 MED ORDER — GABAPENTIN 300 MG PO CAPS: 300.0000 mg | ORAL_CAPSULE | Freq: Three times a day (TID) | ORAL | 1 refills | Status: DC | PRN

## 2022-10-01 NOTE — Patient Instructions (Signed)
Medication Instructions:  INCREASE METOPROLOL 37.5 MG *If you need a refill on your cardiac medications before your next appointment, please call your pharmacy*  Lab Work: CBC, BMET AND MAG TODAY If you have labs (blood work) drawn today and your tests are completely normal, you will receive your results only by:  MyChart Message (if you have MyChart) OR  A paper copy in the mail If you have any lab test that is abnormal or we need to change your treatment, we will call you to review the results.  Testing/Procedures: Your physician has requested that you have an echocardiogram. Echocardiography is a painless test that uses sound waves to create images of your heart. It provides your doctor with information about the size and shape of your heart and how well your heart's chambers and valves are working. This procedure takes approximately one hour. There are no restrictions for this procedure. Please do NOT wear cologne, perfume, aftershave, or lotions (deodorant is allowed). Please arrive 15 minutes prior to your appointment time.   PLEASE READ AND FOLLOW ATTACHED ZIO MONITOR INSTRUCTIONS-THIS WILL BE COMING IN THE MAIL  Follow-Up: At [facility], you and your health needs are our priority.  As part of our continuing mission to provide you with exceptional heart care, we have created designated Provider Care Teams.  These Care Teams include your primary Cardiologist (physician) and Advanced Practice Providers (APPs -  Physician Assistants and Nurse Practitioners) who all work together to provide you with the care you need, when you need it.  Your next appointment:   6 month(s)  Provider:   Chrystie Nose, MD  or Edd Fabian, FNP        Christena Deem- Long Term Monitor Instructions  Your physician has requested you wear a ZIO patch monitor for 14 days.  This is a single patch monitor. [employer] supplies one patch monitor per enrollment. Additional stickers are not available. Please do  not apply patch if you will be having a Nuclear Stress Test,  Echocardiogram, Cardiac CT, MRI, or Chest Xray during the period you would be wearing the  monitor. The patch cannot be worn during these tests. You cannot remove and re-apply the  ZIO XT patch monitor.  Your ZIO patch monitor will be mailed 3 day USPS to your address on file. It may take 3-5 days  to receive your monitor after you have been enrolled.  Once you have received your monitor, please review the enclosed instructions. Your monitor  has already been registered assigning a specific monitor serial # to you.  Billing and Patient Assistance Program Information  We have supplied [employer] with any of your insurance information on file for billing purposes. [employer] offers a sliding scale Patient Assistance Program for patients that do not have  insurance, or whose insurance does not completely cover the cost of the ZIO monitor.  You must apply for the Patient Assistance Program to qualify for this discounted rate.  To apply, please call [employer] at [phone], select option 4, select option 2, ask to apply for  Patient Assistance Program. Meredeth Ide will ask your household income, and how many people  are in your household. They will quote your out-of-pocket cost based on that information.  [employer] will also be able to set up a 81-month, interest-free payment plan if needed.  Applying the monitor   Shave hair from upper left chest.  Hold abrader disc by orange tab. Rub abrader in 40 strokes over the upper left chest  as  indicated in your monitor instructions.  Clean area with 4 enclosed alcohol pads. Let dry.  Apply patch as indicated in monitor instructions. Patch will be placed under collarbone on left  side of chest with arrow pointing upward.  Rub patch adhesive wings for 2 minutes. Remove white label marked "1". Remove the white  label marked "2". Rub patch adhesive wings for 2 additional minutes.  While looking in a  mirror, press and release button in center of patch. A small green light will  flash 3-4 times. This will be your only indicator that the monitor has been turned on.  Do not shower for the first 24 hours. You may shower after the first 24 hours.  Press the button if you feel a symptom. You will hear a small click. Record Date, Time and  Symptom in the Patient Logbook.  When you are ready to remove the patch, follow instructions on the last 2 pages of Patient  Logbook. Stick patch monitor onto the last page of Patient Logbook.  Place Patient Logbook in the blue and white box. Use locking tab on box and tape box closed  securely. The blue and white box has prepaid postage on it. Please place it in the mailbox as  soon as possible. Your physician should have your test results approximately 7 days after the  monitor has been mailed back to [facility].  Call [facility] Customer Care at [phone] if you have questions regarding  your ZIO XT patch monitor. Call them immediately if you see an orange light blinking on your  monitor.  If your monitor falls off in less than 4 days, contact our Monitor department at [phone].  If your monitor becomes loose or falls off after 4 days call [employer] at [phone] for  suggestions on securing your monitor

## 2022-10-01 NOTE — Progress Notes (Unsigned)
Enrolled for [employer] to mail a ZIO XT long term holter monitor to the patients address on file.   Dr. Hilty to read. 

## 2022-10-02 ENCOUNTER — Telehealth: Payer: Self-pay | Admitting: General Practice

## 2022-10-02 ENCOUNTER — Telehealth: Payer: Self-pay

## 2022-10-02 LAB — BASIC METABOLIC PANEL
BUN/Creatinine Ratio: 15 (ref 10–24)
BUN: 14 mg/dL (ref 8–27)
CO2: 24 mmol/L (ref 20–29)
Calcium: 9.6 mg/dL (ref 8.6–10.2)
Chloride: 95 mmol/L — ABNORMAL LOW (ref 96–106)
Creatinine, Ser: 0.95 mg/dL (ref 0.76–1.27)
Glucose: 85 mg/dL (ref 70–99)
Potassium: 4.7 mmol/L (ref 3.5–5.2)
Sodium: 139 mmol/L (ref 134–144)
eGFR: 86 mL/min/{1.73_m2} (ref >59)

## 2022-10-02 LAB — CBC
Hematocrit: 41.6 % (ref 37.5–51.0)
Hemoglobin: 13.9 g/dL (ref 13.0–17.7)
MCH: 29.1 pg (ref 26.6–33.0)
MCHC: 33.4 g/dL (ref 31.5–35.7)
MCV: 87 fL (ref 79–97)
Platelets: 312 10*3/uL (ref 150–450)
RBC: 4.77 x10E6/uL (ref 4.14–5.80)
RDW: 13.1 % (ref 11.6–15.4)
WBC: 12.4 10*3/uL — ABNORMAL HIGH (ref 3.4–10.8)

## 2022-10-02 LAB — MAGNESIUM: Magnesium: 1.8 mg/dL (ref 1.6–2.3)

## 2022-10-02 NOTE — Telephone Encounter (Signed)
Follow Up:     Patient's wife is returning a call fromm today.

## 2022-10-02 NOTE — Telephone Encounter (Signed)
Addressed in separate encounter.

## 2022-10-02 NOTE — Telephone Encounter (Signed)
Call to patient with results. Wifes VM picks up.   LM to call office.  Labs sent to PCP    Please contact Mr. Ehrhardt.  His CBC showed that his white blood cell count was slightly elevated at 12.4.  His blood counts are stable.  His renal function and electrolytes are stable.  His magnesium was normal.  Please forward these results to his PCP for recommendations on antibiotic therapy.  No further testing is needed at this time.  Thank you.

## 2022-10-02 NOTE — Telephone Encounter (Signed)
Al information given to patient and wife.  State understanding

## 2022-10-04 ENCOUNTER — Other Ambulatory Visit: Payer: Self-pay

## 2022-10-04 DIAGNOSIS — I10 Essential (primary) hypertension: Secondary | ICD-10-CM

## 2022-10-04 MED ORDER — METOPROLOL SUCCINATE ER 25 MG PO TB24: 37.5000 mg | ORAL_TABLET | Freq: Every day | ORAL | 3 refills | Status: DC

## 2022-10-23 ENCOUNTER — Ambulatory Visit (HOSPITAL_COMMUNITY): Payer: Medicare HMO

## 2022-10-23 ENCOUNTER — Encounter: Payer: Self-pay | Admitting: Family Medicine

## 2022-10-23 DIAGNOSIS — J479 Bronchiectasis, uncomplicated: Secondary | ICD-10-CM

## 2022-10-24 ENCOUNTER — Other Ambulatory Visit: Payer: Self-pay

## 2022-10-24 MED ORDER — FUROSEMIDE 40 MG PO TABS: 40.0000 mg | ORAL_TABLET | Freq: Every day | ORAL | 3 refills | Status: DC

## 2022-10-24 MED ORDER — COMBIVENT RESPIMAT 20-100 MCG/ACT IN AERS
INHALATION_SPRAY | RESPIRATORY_TRACT | 11 refills | Status: DC
Start: 2022-10-24 — End: 2023-11-05

## 2022-10-24 MED ORDER — MAGNESIUM OXIDE -MG SUPPLEMENT 400 (240 MG) MG PO TABS: 1.0000 | ORAL_TABLET | Freq: Every day | ORAL | 3 refills | Status: AC

## 2022-10-24 MED ORDER — FUROSEMIDE 40 MG PO TABS: 40.0000 mg | ORAL_TABLET | Freq: Every day | ORAL | 3 refills | Status: AC

## 2022-10-28 ENCOUNTER — Other Ambulatory Visit: Payer: Self-pay | Admitting: Family Medicine

## 2022-10-28 DIAGNOSIS — G6289 Other specified polyneuropathies: Secondary | ICD-10-CM

## 2023-01-13 ENCOUNTER — Encounter: Payer: Self-pay | Admitting: Family Medicine

## 2023-01-20 ENCOUNTER — Telehealth: Payer: Self-pay | Admitting: Family Medicine

## 2023-01-20 NOTE — Telephone Encounter (Signed)
Left vm to come in for an office visit

## 2023-02-13 ENCOUNTER — Telehealth: Payer: Self-pay | Admitting: Family Medicine

## 2023-02-13 NOTE — Telephone Encounter (Addendum)
 Left vm to call and let me know when he might be able to talk  Left vm that they should follow up with their new PCP and that I had enjoyed working with them

## 2023-03-04 ENCOUNTER — Other Ambulatory Visit: Payer: Self-pay | Admitting: Family Medicine

## 2023-03-10 ENCOUNTER — Other Ambulatory Visit: Payer: Self-pay

## 2023-03-10 DIAGNOSIS — E119 Type 2 diabetes mellitus without complications: Secondary | ICD-10-CM

## 2023-03-11 MED ORDER — METFORMIN HCL ER 500 MG PO TB24: 500.0000 mg | ORAL_TABLET | Freq: Every day | ORAL | 3 refills | Status: AC

## 2023-03-11 MED ORDER — LOSARTAN POTASSIUM 50 MG PO TABS: 50.0000 mg | ORAL_TABLET | Freq: Every day | ORAL | 2 refills | Status: DC

## 2023-08-22 ENCOUNTER — Encounter: Payer: Self-pay | Admitting: Advanced Practice Midwife

## 2023-10-25 ENCOUNTER — Other Ambulatory Visit: Payer: Self-pay | Admitting: Family Medicine

## 2023-11-05 ENCOUNTER — Other Ambulatory Visit: Payer: Self-pay | Admitting: Family Medicine

## 2023-11-05 DIAGNOSIS — J479 Bronchiectasis, uncomplicated: Secondary | ICD-10-CM

## 2023-11-18 ENCOUNTER — Ambulatory Visit: Admitting: Student

## 2023-12-02 ENCOUNTER — Ambulatory Visit: Admitting: Student

## 2023-12-16 ENCOUNTER — Ambulatory Visit: Admitting: Family Medicine

## 2023-12-19 NOTE — Progress Notes (Signed)
 ARUN HERROD                                          MRN: [id number]   12/19/2023   The [facility] Quality Team Specialist reviewed this patient medical record for the purposes of chart review for care gap closure. The following were reviewed: chart review for care gap closure-glycemic status assessment.    VBCI Quality Team

## 2023-12-19 NOTE — Progress Notes (Signed)
 Juan [name]                                          MRN: [id number]   12/19/2023   The [facility]BCI Quality Team Specialist reviewed this patient medical record for the purposes of chart review for care gap closure. The following were reviewed: chart review for care gap closure-controlling blood pressure.    VBCI Quality Team

## 2023-12-22 ENCOUNTER — Telehealth: Payer: Self-pay | Admitting: Pharmacist

## 2023-12-22 NOTE — Telephone Encounter (Signed)
 Attempted to contact patient for follow-up of scheduling with PCP for DM QI'25.    Left HIPAA compliant voice mail to Nathanel (spouse) requesting call back to direct phone: [phone]  Total time with patient call and documentation of interaction: 5 minutes.

## 2024-01-15 NOTE — Progress Notes (Signed)
 Juan [name]                                          MRN: [id number]   01/15/2024   The VBCI Quality Team Specialist reviewed this patient medical record for the purposes of chart review for care gap closure. The following were reviewed: chart review for care gap closure-controlling blood pressure.    VBCI Quality Team

## 2024-01-16 NOTE — Progress Notes (Signed)
 ALEE KATEN                                          MRN: [id number]   01/16/2024   The VBCI Quality Team Specialist reviewed this patient medical record for the purposes of chart review for care gap closure. The following were reviewed: chart review for care gap closure-kidney health evaluation for diabetes:eGFR  and uACR.    VBCI Quality Team

## 2024-01-22 NOTE — Progress Notes (Signed)
 Juan [name]                                          MRN: [id number]   01/22/2024   The [facility] Quality Team Specialist reviewed this patient medical record for the purposes of chart review for care gap closure. The following were reviewed: chart review for care gap closure-glycemic status assessment.    [facility] Quality Team

## 2024-01-27 ENCOUNTER — Ambulatory Visit: Admitting: Family Medicine

## 2024-01-27 NOTE — Progress Notes (Signed)
 Juan [name]                                          MRN: [id number]   01/27/2024   The [facility] Quality Team Specialist reviewed this patient medical record for the purposes of chart review for care gap closure. The following were reviewed: chart review for care gap closure-diabetic eye exam.    [facility] Quality Team

## 2024-02-17 ENCOUNTER — Other Ambulatory Visit: Payer: Self-pay | Admitting: Family Medicine

## 2024-02-24 NOTE — Progress Notes (Signed)
 Juan [name]                                          MRN: [id number]   02/24/2024   The [facility] Quality Team Specialist reviewed this patient medical record for the purposes of chart review for care gap closure. The following were reviewed: chart review for care gap closure-controlling blood pressure, diabetic eye exam, glycemic status assessment, and kidney health evaluation for diabetes:eGFR  and uACR.    [facility] Quality Team

## 2024-03-03 ENCOUNTER — Other Ambulatory Visit: Payer: Self-pay | Admitting: General Practice

## 2024-03-03 DIAGNOSIS — I10 Essential (primary) hypertension: Secondary | ICD-10-CM

## 2024-03-04 ENCOUNTER — Telehealth: Payer: Self-pay | Admitting: Pharmacist

## 2024-03-04 NOTE — Telephone Encounter (Signed)
 Attempted to contact patient for follow-up of statin use follow-up and  missed appointment and request to reschedule.  Unable to leave voicemail  Total time with patient call and documentation of interaction: 1 minutes.  Follow-up phone call planned: Not at this time
